# Patient Record
Sex: Female | Born: 1970
Health system: Southern US, Community
[De-identification: ages and names within clinical notes are randomized; demographics above are authoritative.]

## PROBLEM LIST (undated history)

## (undated) DIAGNOSIS — R112 Nausea with vomiting, unspecified: Secondary | ICD-10-CM

## (undated) DIAGNOSIS — F411 Generalized anxiety disorder: Secondary | ICD-10-CM

## (undated) DIAGNOSIS — D509 Iron deficiency anemia, unspecified: Secondary | ICD-10-CM

## (undated) DIAGNOSIS — R350 Frequency of micturition: Secondary | ICD-10-CM

## (undated) DIAGNOSIS — A599 Trichomoniasis, unspecified: Secondary | ICD-10-CM

## (undated) DIAGNOSIS — E785 Hyperlipidemia, unspecified: Secondary | ICD-10-CM

## (undated) DIAGNOSIS — R030 Elevated blood-pressure reading, without diagnosis of hypertension: Secondary | ICD-10-CM

## (undated) DIAGNOSIS — A749 Chlamydial infection, unspecified: Secondary | ICD-10-CM

## (undated) DIAGNOSIS — A6000 Herpesviral infection of urogenital system, unspecified: Secondary | ICD-10-CM

## (undated) DIAGNOSIS — I10 Essential (primary) hypertension: Secondary | ICD-10-CM

## (undated) DIAGNOSIS — R609 Edema, unspecified: Secondary | ICD-10-CM

## (undated) DIAGNOSIS — Z803 Family history of malignant neoplasm of breast: Secondary | ICD-10-CM

## (undated) DIAGNOSIS — B20 Human immunodeficiency virus [HIV] disease: Secondary | ICD-10-CM

## (undated) DIAGNOSIS — Z801 Family history of malignant neoplasm of trachea, bronchus and lung: Secondary | ICD-10-CM

## (undated) DIAGNOSIS — M1990 Unspecified osteoarthritis, unspecified site: Secondary | ICD-10-CM

## (undated) DIAGNOSIS — Z9889 Other specified postprocedural states: Secondary | ICD-10-CM

## (undated) DIAGNOSIS — R339 Retention of urine, unspecified: Secondary | ICD-10-CM

## (undated) DIAGNOSIS — B009 Herpesviral infection, unspecified: Secondary | ICD-10-CM

## (undated) DIAGNOSIS — K219 Gastro-esophageal reflux disease without esophagitis: Secondary | ICD-10-CM

## (undated) DIAGNOSIS — Z923 Personal history of irradiation: Secondary | ICD-10-CM

## (undated) DIAGNOSIS — E119 Type 2 diabetes mellitus without complications: Secondary | ICD-10-CM

## (undated) DIAGNOSIS — N921 Excessive and frequent menstruation with irregular cycle: Secondary | ICD-10-CM

## (undated) DIAGNOSIS — Z800 Family history of malignant neoplasm of digestive organs: Secondary | ICD-10-CM

## (undated) DIAGNOSIS — C801 Malignant (primary) neoplasm, unspecified: Secondary | ICD-10-CM

## (undated) DIAGNOSIS — Z87442 Personal history of urinary calculi: Secondary | ICD-10-CM

## (undated) DIAGNOSIS — G4730 Sleep apnea, unspecified: Secondary | ICD-10-CM

## (undated) DIAGNOSIS — E559 Vitamin D deficiency, unspecified: Secondary | ICD-10-CM

## (undated) DIAGNOSIS — L02229 Furuncle of trunk, unspecified: Secondary | ICD-10-CM

## (undated) HISTORY — PX: TUBAL LIGATION: SHX77

## (undated) HISTORY — DX: Hyperlipidemia, unspecified: E78.5

## (undated) HISTORY — DX: Unspecified osteoarthritis, unspecified site: M19.90

## (undated) HISTORY — DX: Furuncle of trunk, unspecified: L02.229

## (undated) HISTORY — DX: Generalized anxiety disorder: F41.1

## (undated) HISTORY — PX: ESOPHAGOGASTRODUODENOSCOPY: SHX1529

## (undated) HISTORY — DX: Herpesviral infection, unspecified: B00.9

## (undated) HISTORY — PX: MULTIPLE TOOTH EXTRACTIONS: SHX2053

## (undated) HISTORY — DX: Family history of malignant neoplasm of trachea, bronchus and lung: Z80.1

## (undated) HISTORY — DX: Frequency of micturition: R35.0

## (undated) HISTORY — DX: Elevated blood-pressure reading, without diagnosis of hypertension: R03.0

## (undated) HISTORY — DX: Family history of malignant neoplasm of digestive organs: Z80.0

## (undated) HISTORY — PX: WISDOM TOOTH EXTRACTION: SHX21

## (undated) HISTORY — DX: Excessive and frequent menstruation with irregular cycle: N92.1

## (undated) HISTORY — DX: Vitamin D deficiency, unspecified: E55.9

## (undated) HISTORY — DX: Human immunodeficiency virus (HIV) disease: B20

## (undated) HISTORY — DX: Retention of urine, unspecified: R33.9

## (undated) HISTORY — DX: Trichomoniasis, unspecified: A59.9

## (undated) HISTORY — PX: CHOLECYSTECTOMY: SHX55

## (undated) HISTORY — DX: Iron deficiency anemia, unspecified: D50.9

## (undated) HISTORY — DX: Edema, unspecified: R60.9

## (undated) HISTORY — DX: Family history of malignant neoplasm of breast: Z80.3

## (undated) HISTORY — DX: Chlamydial infection, unspecified: A74.9

## (undated) HISTORY — DX: Sleep apnea, unspecified: G47.30

## (undated) HISTORY — DX: Herpesviral infection of urogenital system, unspecified: A60.00

## (undated) HISTORY — DX: Type 2 diabetes mellitus without complications: E11.9

---

## 1997-11-23 ENCOUNTER — Inpatient Hospital Stay (HOSPITAL_COMMUNITY): Admission: EM | Admit: 1997-11-23 | Discharge: 1997-11-23 | Payer: Self-pay | Admitting: Obstetrics & Gynecology

## 1998-06-12 ENCOUNTER — Other Ambulatory Visit: Admission: RE | Admit: 1998-06-12 | Discharge: 1998-06-12 | Payer: Self-pay | Admitting: Obstetrics & Gynecology

## 1998-12-23 ENCOUNTER — Inpatient Hospital Stay (HOSPITAL_COMMUNITY): Admission: EM | Admit: 1998-12-23 | Discharge: 1998-12-23 | Payer: Self-pay | Admitting: *Deleted

## 1999-10-02 ENCOUNTER — Emergency Department (HOSPITAL_COMMUNITY): Admission: EM | Admit: 1999-10-02 | Discharge: 1999-10-02 | Payer: Self-pay | Admitting: Emergency Medicine

## 1999-11-16 ENCOUNTER — Ambulatory Visit (HOSPITAL_COMMUNITY): Admission: RE | Admit: 1999-11-16 | Discharge: 1999-11-16 | Payer: Self-pay | Admitting: Family Medicine

## 1999-11-16 ENCOUNTER — Encounter: Payer: Self-pay | Admitting: Family Medicine

## 2000-08-10 ENCOUNTER — Emergency Department (HOSPITAL_COMMUNITY): Admission: EM | Admit: 2000-08-10 | Discharge: 2000-08-10 | Payer: Self-pay | Admitting: Internal Medicine

## 2001-07-05 ENCOUNTER — Ambulatory Visit (HOSPITAL_BASED_OUTPATIENT_CLINIC_OR_DEPARTMENT_OTHER): Admission: RE | Admit: 2001-07-05 | Discharge: 2001-07-05 | Payer: Self-pay | Admitting: Orthopedic Surgery

## 2002-03-20 ENCOUNTER — Other Ambulatory Visit: Admission: RE | Admit: 2002-03-20 | Discharge: 2002-03-20 | Payer: Self-pay | Admitting: *Deleted

## 2002-03-30 ENCOUNTER — Emergency Department (HOSPITAL_COMMUNITY): Admission: EM | Admit: 2002-03-30 | Discharge: 2002-03-30 | Payer: Self-pay | Admitting: *Deleted

## 2002-12-13 ENCOUNTER — Emergency Department (HOSPITAL_COMMUNITY): Admission: EM | Admit: 2002-12-13 | Discharge: 2002-12-13 | Payer: Self-pay | Admitting: Emergency Medicine

## 2002-12-13 ENCOUNTER — Encounter: Payer: Self-pay | Admitting: Emergency Medicine

## 2002-12-31 ENCOUNTER — Emergency Department (HOSPITAL_COMMUNITY): Admission: EM | Admit: 2002-12-31 | Discharge: 2002-12-31 | Payer: Self-pay | Admitting: Emergency Medicine

## 2003-01-10 ENCOUNTER — Emergency Department (HOSPITAL_COMMUNITY): Admission: EM | Admit: 2003-01-10 | Discharge: 2003-01-10 | Payer: Self-pay

## 2003-01-11 ENCOUNTER — Emergency Department (HOSPITAL_COMMUNITY): Admission: EM | Admit: 2003-01-11 | Discharge: 2003-01-11 | Payer: Self-pay | Admitting: Emergency Medicine

## 2003-01-12 ENCOUNTER — Emergency Department (HOSPITAL_COMMUNITY): Admission: EM | Admit: 2003-01-12 | Discharge: 2003-01-12 | Payer: Self-pay | Admitting: *Deleted

## 2003-05-06 ENCOUNTER — Emergency Department (HOSPITAL_COMMUNITY): Admission: EM | Admit: 2003-05-06 | Discharge: 2003-05-06 | Payer: Self-pay | Admitting: Emergency Medicine

## 2003-07-15 ENCOUNTER — Emergency Department (HOSPITAL_COMMUNITY): Admission: EM | Admit: 2003-07-15 | Discharge: 2003-07-15 | Payer: Self-pay | Admitting: Emergency Medicine

## 2003-07-30 ENCOUNTER — Emergency Department (HOSPITAL_COMMUNITY): Admission: EM | Admit: 2003-07-30 | Discharge: 2003-07-30 | Payer: Self-pay | Admitting: Emergency Medicine

## 2005-01-11 ENCOUNTER — Ambulatory Visit: Payer: Self-pay | Admitting: Internal Medicine

## 2005-09-10 ENCOUNTER — Other Ambulatory Visit: Admission: RE | Admit: 2005-09-10 | Discharge: 2005-09-10 | Payer: Self-pay | Admitting: Gynecology

## 2005-10-08 ENCOUNTER — Ambulatory Visit (HOSPITAL_COMMUNITY): Admission: RE | Admit: 2005-10-08 | Discharge: 2005-10-08 | Payer: Self-pay | Admitting: Gynecology

## 2005-10-25 ENCOUNTER — Encounter: Admission: RE | Admit: 2005-10-25 | Discharge: 2005-10-25 | Payer: Self-pay | Admitting: Gynecology

## 2005-12-31 ENCOUNTER — Emergency Department (HOSPITAL_COMMUNITY): Admission: EM | Admit: 2005-12-31 | Discharge: 2005-12-31 | Payer: Self-pay | Admitting: Emergency Medicine

## 2006-04-18 ENCOUNTER — Ambulatory Visit: Payer: Self-pay | Admitting: Internal Medicine

## 2006-11-07 ENCOUNTER — Other Ambulatory Visit: Admission: RE | Admit: 2006-11-07 | Discharge: 2006-11-07 | Payer: Self-pay | Admitting: Gynecology

## 2007-01-23 ENCOUNTER — Ambulatory Visit: Payer: Self-pay | Admitting: Internal Medicine

## 2007-01-23 LAB — CONVERTED CEMR LAB
ALT: 23 units/L (ref 0–35)
AST: 27 units/L (ref 0–37)
Albumin: 3.6 g/dL (ref 3.5–5.2)
Alkaline Phosphatase: 118 units/L — ABNORMAL HIGH (ref 39–117)
BUN: 9 mg/dL (ref 6–23)
Basophils Absolute: 0 10*3/uL (ref 0.0–0.1)
Basophils Relative: 0.2 % (ref 0.0–1.0)
Bilirubin Urine: NEGATIVE
Bilirubin, Direct: 0.1 mg/dL (ref 0.0–0.3)
CO2: 30 meq/L (ref 19–32)
Calcium: 9.4 mg/dL (ref 8.4–10.5)
Chloride: 104 meq/L (ref 96–112)
Cholesterol: 211 mg/dL (ref 0–200)
Creatinine, Ser: 0.6 mg/dL (ref 0.4–1.2)
Direct LDL: 132.7 mg/dL
Eosinophils Absolute: 0 10*3/uL (ref 0.0–0.6)
Eosinophils Relative: 0.3 % (ref 0.0–5.0)
GFR calc Af Amer: 144 mL/min
GFR calc non Af Amer: 119 mL/min
Glucose, Bld: 72 mg/dL (ref 70–99)
HCT: 36.4 % (ref 36.0–46.0)
HDL: 45.3 mg/dL (ref >39.0)
Hemoglobin, Urine: NEGATIVE
Hemoglobin: 12 g/dL (ref 12.0–15.0)
Hgb A1c MFr Bld: 6.7 % — ABNORMAL HIGH (ref 4.6–6.0)
Ketones, ur: NEGATIVE mg/dL
Leukocytes, UA: NEGATIVE
Lymphocytes Relative: 41.5 % (ref 12.0–46.0)
MCHC: 33 g/dL (ref 30.0–36.0)
MCV: 76.9 fL — ABNORMAL LOW (ref 78.0–100.0)
Monocytes Absolute: 0.5 10*3/uL (ref 0.2–0.7)
Monocytes Relative: 9.8 % (ref 3.0–11.0)
Neutro Abs: 2.7 10*3/uL (ref 1.4–7.7)
Neutrophils Relative %: 48.2 % (ref 43.0–77.0)
Nitrite: NEGATIVE
Platelets: 338 10*3/uL (ref 150–400)
Potassium: 3.8 meq/L (ref 3.5–5.1)
RBC: 4.73 M/uL (ref 3.87–5.11)
RDW: 15.7 % — ABNORMAL HIGH (ref 11.5–14.6)
Sodium: 141 meq/L (ref 135–145)
Specific Gravity, Urine: >=1.03 (ref 1.000–1.03)
TSH: 2.99 microintl units/mL (ref 0.35–5.50)
Total Bilirubin: 0.5 mg/dL (ref 0.3–1.2)
Total CHOL/HDL Ratio: 4.7
Total Protein, Urine: NEGATIVE mg/dL
Total Protein: 7.6 g/dL (ref 6.0–8.3)
Triglycerides: 80 mg/dL (ref 0–149)
Urine Glucose: NEGATIVE mg/dL
Urobilinogen, UA: 0.2 (ref 0.0–1.0)
VLDL: 16 mg/dL (ref 0–40)
WBC: 5.5 10*3/uL (ref 4.5–10.5)
pH: 6 (ref 5.0–8.0)

## 2007-03-07 ENCOUNTER — Ambulatory Visit: Payer: Self-pay | Admitting: Internal Medicine

## 2007-03-07 LAB — CONVERTED CEMR LAB
BUN: 8 mg/dL (ref 6–23)
CO2: 28 meq/L (ref 19–32)
Calcium: 9.1 mg/dL (ref 8.4–10.5)
Chloride: 104 meq/L (ref 96–112)
Cholesterol: 198 mg/dL (ref 0–200)
Creatinine, Ser: 0.6 mg/dL (ref 0.4–1.2)
GFR calc Af Amer: 144 mL/min
GFR calc non Af Amer: 119 mL/min
Glucose, Bld: 106 mg/dL — ABNORMAL HIGH (ref 70–99)
HDL: 52.4 mg/dL (ref >39.0)
Hgb A1c MFr Bld: 6.3 % — ABNORMAL HIGH (ref 4.6–6.0)
LDL Cholesterol: 123 mg/dL — ABNORMAL HIGH (ref 0–99)
Potassium: 3.7 meq/L (ref 3.5–5.1)
Sodium: 139 meq/L (ref 135–145)
Total CHOL/HDL Ratio: 3.8
Triglycerides: 115 mg/dL (ref 0–149)
VLDL: 23 mg/dL (ref 0–40)

## 2007-03-17 ENCOUNTER — Encounter: Admission: RE | Admit: 2007-03-17 | Discharge: 2007-06-15 | Payer: Self-pay | Admitting: Internal Medicine

## 2007-07-27 DIAGNOSIS — Z21 Asymptomatic human immunodeficiency virus [HIV] infection status: Secondary | ICD-10-CM

## 2007-07-27 DIAGNOSIS — B20 Human immunodeficiency virus [HIV] disease: Secondary | ICD-10-CM

## 2007-07-27 HISTORY — DX: Asymptomatic human immunodeficiency virus (hiv) infection status: Z21

## 2007-07-27 HISTORY — DX: Human immunodeficiency virus (HIV) disease: B20

## 2007-08-02 ENCOUNTER — Ambulatory Visit: Payer: Self-pay | Admitting: Internal Medicine

## 2007-08-02 DIAGNOSIS — J209 Acute bronchitis, unspecified: Secondary | ICD-10-CM | POA: Insufficient documentation

## 2007-08-02 DIAGNOSIS — E08319 Diabetes mellitus due to underlying condition with unspecified diabetic retinopathy without macular edema: Secondary | ICD-10-CM | POA: Insufficient documentation

## 2007-08-02 DIAGNOSIS — E119 Type 2 diabetes mellitus without complications: Secondary | ICD-10-CM

## 2007-08-02 DIAGNOSIS — E785 Hyperlipidemia, unspecified: Secondary | ICD-10-CM

## 2007-08-02 DIAGNOSIS — R030 Elevated blood-pressure reading, without diagnosis of hypertension: Secondary | ICD-10-CM | POA: Insufficient documentation

## 2007-08-02 HISTORY — DX: Hyperlipidemia, unspecified: E78.5

## 2007-08-02 HISTORY — DX: Type 2 diabetes mellitus without complications: E11.9

## 2007-08-02 HISTORY — DX: Elevated blood-pressure reading, without diagnosis of hypertension: R03.0

## 2007-10-12 ENCOUNTER — Ambulatory Visit: Payer: Self-pay | Admitting: Internal Medicine

## 2007-10-26 ENCOUNTER — Encounter: Payer: Self-pay | Admitting: Internal Medicine

## 2007-11-17 ENCOUNTER — Other Ambulatory Visit: Admission: RE | Admit: 2007-11-17 | Discharge: 2007-11-17 | Payer: Self-pay | Admitting: Gynecology

## 2007-11-20 LAB — CONVERTED CEMR LAB: Pap Smear: NORMAL

## 2007-11-27 ENCOUNTER — Ambulatory Visit: Payer: Self-pay | Admitting: Internal Medicine

## 2007-11-27 DIAGNOSIS — F418 Other specified anxiety disorders: Secondary | ICD-10-CM | POA: Insufficient documentation

## 2007-11-27 DIAGNOSIS — F411 Generalized anxiety disorder: Secondary | ICD-10-CM | POA: Insufficient documentation

## 2007-11-27 HISTORY — DX: Generalized anxiety disorder: F41.1

## 2007-11-27 LAB — CONVERTED CEMR LAB
ALT: 24 units/L (ref 0–35)
AST: 26 units/L (ref 0–37)
Albumin: 3.6 g/dL (ref 3.5–5.2)
Alkaline Phosphatase: 113 units/L (ref 39–117)
BUN: 11 mg/dL (ref 6–23)
Basophils Absolute: 0 10*3/uL (ref 0.0–0.1)
Basophils Relative: 0.8 % (ref 0.0–1.0)
Bilirubin Urine: NEGATIVE
Bilirubin, Direct: 0.1 mg/dL (ref 0.0–0.3)
CO2: 29 meq/L (ref 19–32)
Calcium: 9.4 mg/dL (ref 8.4–10.5)
Chloride: 102 meq/L (ref 96–112)
Cholesterol: 192 mg/dL (ref 0–200)
Creatinine, Ser: 0.7 mg/dL (ref 0.4–1.2)
Creatinine,U: 207.1 mg/dL
Eosinophils Absolute: 0 10*3/uL (ref 0.0–0.7)
Eosinophils Relative: 0.5 % (ref 0.0–5.0)
GFR calc Af Amer: 120 mL/min
GFR calc non Af Amer: 100 mL/min
Glucose, Bld: 100 mg/dL — ABNORMAL HIGH (ref 70–99)
HCT: 36.1 % (ref 36.0–46.0)
HDL: 41.9 mg/dL (ref >39.0)
Hemoglobin, Urine: NEGATIVE
Hemoglobin: 11.8 g/dL — ABNORMAL LOW (ref 12.0–15.0)
Hgb A1c MFr Bld: 6.5 % — ABNORMAL HIGH (ref 4.6–6.0)
LDL Cholesterol: 132 mg/dL — ABNORMAL HIGH (ref 0–99)
Leukocytes, UA: NEGATIVE
Lymphocytes Relative: 39.7 % (ref 12.0–46.0)
MCHC: 32.6 g/dL (ref 30.0–36.0)
MCV: 78 fL (ref 78.0–100.0)
Microalb Creat Ratio: 4.3 mg/g (ref 0.0–30.0)
Microalb, Ur: 0.9 mg/dL (ref 0.0–1.9)
Monocytes Absolute: 0.4 10*3/uL (ref 0.1–1.0)
Monocytes Relative: 8.4 % (ref 3.0–12.0)
Neutro Abs: 2.4 10*3/uL (ref 1.4–7.7)
Neutrophils Relative %: 50.6 % (ref 43.0–77.0)
Nitrite: NEGATIVE
Platelets: 354 10*3/uL (ref 150–400)
Potassium: 4.1 meq/L (ref 3.5–5.1)
RBC: 4.63 M/uL (ref 3.87–5.11)
RDW: 16.2 % — ABNORMAL HIGH (ref 11.5–14.6)
Sodium: 138 meq/L (ref 135–145)
Specific Gravity, Urine: 1.025 (ref 1.000–1.03)
TSH: 2.3 microintl units/mL (ref 0.35–5.50)
Total Bilirubin: 0.6 mg/dL (ref 0.3–1.2)
Total CHOL/HDL Ratio: 4.6
Total Protein, Urine: NEGATIVE mg/dL
Total Protein: 7.5 g/dL (ref 6.0–8.3)
Triglycerides: 91 mg/dL (ref 0–149)
Urine Glucose: NEGATIVE mg/dL
Urobilinogen, UA: 0.2 (ref 0.0–1.0)
VLDL: 18 mg/dL (ref 0–40)
WBC: 4.7 10*3/uL (ref 4.5–10.5)
pH: 6 (ref 5.0–8.0)

## 2008-01-11 HISTORY — PX: ENDOMETRIAL ABLATION: SHX621

## 2008-03-21 ENCOUNTER — Ambulatory Visit: Payer: Self-pay | Admitting: Internal Medicine

## 2008-03-21 DIAGNOSIS — N921 Excessive and frequent menstruation with irregular cycle: Secondary | ICD-10-CM | POA: Insufficient documentation

## 2008-03-21 DIAGNOSIS — A749 Chlamydial infection, unspecified: Secondary | ICD-10-CM

## 2008-03-21 DIAGNOSIS — J069 Acute upper respiratory infection, unspecified: Secondary | ICD-10-CM | POA: Insufficient documentation

## 2008-03-21 HISTORY — DX: Chlamydial infection, unspecified: A74.9

## 2008-03-21 HISTORY — DX: Excessive and frequent menstruation with irregular cycle: N92.1

## 2008-03-22 ENCOUNTER — Encounter: Payer: Self-pay | Admitting: Internal Medicine

## 2008-04-05 ENCOUNTER — Ambulatory Visit: Payer: Self-pay | Admitting: Gynecology

## 2008-05-23 ENCOUNTER — Encounter: Payer: Self-pay | Admitting: Internal Medicine

## 2008-09-05 ENCOUNTER — Encounter: Payer: Self-pay | Admitting: Internal Medicine

## 2008-11-14 ENCOUNTER — Encounter: Payer: Self-pay | Admitting: Internal Medicine

## 2009-01-16 ENCOUNTER — Encounter: Payer: Self-pay | Admitting: Internal Medicine

## 2009-01-17 ENCOUNTER — Other Ambulatory Visit: Admission: RE | Admit: 2009-01-17 | Discharge: 2009-01-17 | Payer: Self-pay | Admitting: Gynecology

## 2009-01-17 ENCOUNTER — Ambulatory Visit: Payer: Self-pay | Admitting: Gynecology

## 2009-01-17 ENCOUNTER — Encounter: Payer: Self-pay | Admitting: Internal Medicine

## 2009-01-17 ENCOUNTER — Encounter: Payer: Self-pay | Admitting: Gynecology

## 2009-01-22 ENCOUNTER — Encounter: Admission: RE | Admit: 2009-01-22 | Discharge: 2009-01-22 | Payer: Self-pay | Admitting: Gynecology

## 2009-02-14 ENCOUNTER — Encounter: Payer: Self-pay | Admitting: Internal Medicine

## 2009-03-12 ENCOUNTER — Ambulatory Visit: Payer: Self-pay | Admitting: Internal Medicine

## 2009-03-12 DIAGNOSIS — A6000 Herpesviral infection of urogenital system, unspecified: Secondary | ICD-10-CM | POA: Insufficient documentation

## 2009-03-12 DIAGNOSIS — B20 Human immunodeficiency virus [HIV] disease: Secondary | ICD-10-CM | POA: Insufficient documentation

## 2009-03-12 HISTORY — DX: Human immunodeficiency virus (HIV) disease: B20

## 2009-03-12 HISTORY — DX: Herpesviral infection of urogenital system, unspecified: A60.00

## 2009-03-12 LAB — CONVERTED CEMR LAB: Hgb A1c MFr Bld: 6.4 % (ref 4.6–6.5)

## 2009-03-21 ENCOUNTER — Encounter: Payer: Self-pay | Admitting: Internal Medicine

## 2009-05-19 ENCOUNTER — Ambulatory Visit: Payer: Self-pay | Admitting: Internal Medicine

## 2009-05-19 DIAGNOSIS — R350 Frequency of micturition: Secondary | ICD-10-CM

## 2009-05-19 DIAGNOSIS — R339 Retention of urine, unspecified: Secondary | ICD-10-CM | POA: Insufficient documentation

## 2009-05-19 HISTORY — DX: Frequency of micturition: R35.0

## 2009-05-19 HISTORY — DX: Retention of urine, unspecified: R33.9

## 2009-05-19 LAB — CONVERTED CEMR LAB
Bilirubin Urine: NEGATIVE
Ketones, ur: NEGATIVE mg/dL
Leukocytes, UA: NEGATIVE
Nitrite: NEGATIVE
Specific Gravity, Urine: 1.025 (ref 1.000–1.030)
Total Protein, Urine: NEGATIVE mg/dL
Urine Glucose: NEGATIVE mg/dL
Urobilinogen, UA: 0.2 (ref 0.0–1.0)
pH: 6 (ref 5.0–8.0)

## 2009-09-10 ENCOUNTER — Ambulatory Visit: Payer: Self-pay | Admitting: Women's Health

## 2009-10-08 ENCOUNTER — Ambulatory Visit: Payer: Self-pay | Admitting: Internal Medicine

## 2009-10-21 ENCOUNTER — Telehealth: Payer: Self-pay | Admitting: Internal Medicine

## 2010-01-19 ENCOUNTER — Other Ambulatory Visit: Admission: RE | Admit: 2010-01-19 | Discharge: 2010-01-19 | Payer: Self-pay | Admitting: Gynecology

## 2010-01-19 ENCOUNTER — Ambulatory Visit: Payer: Self-pay | Admitting: Gynecology

## 2010-01-19 ENCOUNTER — Encounter: Payer: Self-pay | Admitting: Internal Medicine

## 2010-01-19 ENCOUNTER — Encounter: Admission: RE | Admit: 2010-01-19 | Discharge: 2010-01-19 | Payer: Self-pay | Admitting: Gynecology

## 2010-01-19 DIAGNOSIS — A599 Trichomoniasis, unspecified: Secondary | ICD-10-CM

## 2010-01-19 HISTORY — DX: Trichomoniasis, unspecified: A59.9

## 2010-01-20 LAB — HM MAMMOGRAPHY: HM Mammogram: NORMAL

## 2010-01-20 LAB — CONVERTED CEMR LAB: Pap Smear: NORMAL

## 2010-01-28 ENCOUNTER — Ambulatory Visit: Payer: Self-pay | Admitting: Internal Medicine

## 2010-01-30 ENCOUNTER — Ambulatory Visit: Payer: Self-pay | Admitting: Internal Medicine

## 2010-01-30 DIAGNOSIS — R609 Edema, unspecified: Secondary | ICD-10-CM

## 2010-01-30 DIAGNOSIS — E559 Vitamin D deficiency, unspecified: Secondary | ICD-10-CM | POA: Insufficient documentation

## 2010-01-30 DIAGNOSIS — D509 Iron deficiency anemia, unspecified: Secondary | ICD-10-CM | POA: Insufficient documentation

## 2010-01-30 HISTORY — DX: Iron deficiency anemia, unspecified: D50.9

## 2010-01-30 HISTORY — DX: Vitamin D deficiency, unspecified: E55.9

## 2010-01-30 HISTORY — DX: Edema, unspecified: R60.9

## 2010-02-02 LAB — CONVERTED CEMR LAB
ALT: 17 units/L (ref 0–35)
AST: 19 units/L (ref 0–37)
Albumin: 3.7 g/dL (ref 3.5–5.2)
Alkaline Phosphatase: 115 units/L (ref 39–117)
BUN: 9 mg/dL (ref 6–23)
Bilirubin Urine: NEGATIVE
Bilirubin, Direct: 0.2 mg/dL (ref 0.0–0.3)
CO2: 27 meq/L (ref 19–32)
Calcium: 9.1 mg/dL (ref 8.4–10.5)
Chloride: 108 meq/L (ref 96–112)
Cholesterol: 176 mg/dL (ref 0–200)
Creatinine, Ser: 0.6 mg/dL (ref 0.4–1.2)
Creatinine,U: 215.4 mg/dL
Folate: 13.9 ng/mL
GFR calc non Af Amer: 153.35 mL/min (ref >60)
Glucose, Bld: 95 mg/dL (ref 70–99)
HDL: 40.3 mg/dL (ref >39.00)
Hemoglobin, Urine: NEGATIVE
Hgb A1c MFr Bld: 6.5 % (ref 4.6–6.5)
Iron: 35 ug/dL — ABNORMAL LOW (ref 42–145)
Ketones, ur: NEGATIVE mg/dL
LDL Cholesterol: 119 mg/dL — ABNORMAL HIGH (ref 0–99)
Leukocytes, UA: NEGATIVE
Microalb Creat Ratio: 0.4 mg/g (ref 0.0–30.0)
Microalb, Ur: 0.9 mg/dL (ref 0.0–1.9)
Nitrite: NEGATIVE
Potassium: 4.3 meq/L (ref 3.5–5.1)
Saturation Ratios: 8.8 % — ABNORMAL LOW (ref 20.0–50.0)
Sodium: 140 meq/L (ref 135–145)
Specific Gravity, Urine: >=1.03 (ref 1.000–1.030)
TSH: 2.28 microintl units/mL (ref 0.35–5.50)
Total Bilirubin: 1.5 mg/dL — ABNORMAL HIGH (ref 0.3–1.2)
Total CHOL/HDL Ratio: 4
Total Protein, Urine: NEGATIVE mg/dL
Total Protein: 7.2 g/dL (ref 6.0–8.3)
Transferrin: 284.4 mg/dL (ref 212.0–360.0)
Triglycerides: 82 mg/dL (ref 0.0–149.0)
Urine Glucose: NEGATIVE mg/dL
Urobilinogen, UA: 0.2 (ref 0.0–1.0)
VLDL: 16.4 mg/dL (ref 0.0–40.0)
Vitamin B-12: 782 pg/mL (ref 211–911)
pH: 5.5 (ref 5.0–8.0)

## 2010-04-16 ENCOUNTER — Ambulatory Visit: Payer: Self-pay | Admitting: Internal Medicine

## 2010-04-16 DIAGNOSIS — L02229 Furuncle of trunk, unspecified: Secondary | ICD-10-CM | POA: Insufficient documentation

## 2010-04-16 DIAGNOSIS — L02239 Carbuncle of trunk, unspecified: Secondary | ICD-10-CM | POA: Insufficient documentation

## 2010-04-16 HISTORY — DX: Furuncle of trunk, unspecified: L02.229

## 2010-04-16 HISTORY — DX: Carbuncle of trunk, unspecified: L02.239

## 2010-05-15 ENCOUNTER — Telehealth: Payer: Self-pay | Admitting: Internal Medicine

## 2010-06-29 ENCOUNTER — Encounter: Payer: Self-pay | Admitting: Internal Medicine

## 2010-07-03 ENCOUNTER — Encounter: Payer: Self-pay | Admitting: Internal Medicine

## 2010-07-22 HISTORY — PX: ABDOMINAL HYSTERECTOMY: SHX81

## 2010-07-24 ENCOUNTER — Encounter: Payer: Self-pay | Admitting: Internal Medicine

## 2010-08-16 ENCOUNTER — Encounter: Payer: Self-pay | Admitting: Gynecology

## 2010-08-27 NOTE — Letter (Signed)
Summary: [facility]   Imported By: Lester Pe Ell 07/10/2010 10:37:52  _____________________________________________________________________  External Attachment:    Type:   Image     Comment:   External Document

## 2010-08-27 NOTE — Assessment & Plan Note (Signed)
Summary: SORE THROAT/FEVER/BRONCHITIS? /NWS $50   Vital Signs:  Patient Profile:   40 Years Old Female Weight:      243.38 pounds Temp:     98.3 degrees F oral Pulse rate:   103 / minute BP sitting:   122 / 88  (left arm) Cuff size:   regular  Vitals Entered By: Rock Nephew CMA (March 21, 2008 9:35 AM)                 Chief Complaint:  sore throat/bronchitis.  History of Present Illness: 40 y/o reports for acute onset of sore throat 4 days ago that has not been associated with fever, chills, painful swallowing, or LAD. She reports a mild cough productive of scant amt of yellow phlegm and denies experiencing any SOB, night sweats, chest pain, hemoptysis, wheezing, or DOE. She has gotten symptom relief with a left over codeine-based cough suppressant which has run out. She has been using an old "asthma Pump" that has not helped with her symptoms. Her last BS check was 5 days aog and it was 102.    Prior Medication List:  ALPRAZOLAM 0.25 MG TABS (ALPRAZOLAM) Take 1 tablet by mouth three times a day METFORMIN HCL 500 MG  TABS (METFORMIN HCL) 1po qd ADULT ASPIRIN EC LOW STRENGTH 81 MG  TBEC (ASPIRIN) 1 by mouth qd   Current Allergies: ! PENICILLIN   Family History:    Reviewed history from 08/02/2007 and no changes required:       prostate cancer       heart disease       stroke       HTN       DM  Social History:    Reviewed history from 11/27/2007 and no changes required:       Never Smoked       Alcohol use-no       Divorced       5 children       work - Lawyer and bus driver          Risk Factors:  Passive smoke exposure:  no Drug use:  no HIV high-risk behavior:  no   Review of Systems  The patient denies anorexia, fever, hoarseness, chest pain, syncope, dyspnea on exertion, peripheral edema, and muscle weakness.     Physical Exam  General:     alert, well-developed, well-nourished, well-hydrated, and overweight-appearing.  She has no resp  distress, trismus, or stridor. Head:     Normocephalic and atraumatic without obvious abnormalities. No apparent alopecia or balding. Eyes:     No corneal or conjunctival inflammation noted. EOMI. Perrla. Funduscopic exam benign, without hemorrhages, exudates or papilledema. Vision grossly normal. Ears:     External ear exam shows no significant lesions or deformities.  Otoscopic examination reveals clear canals, tympanic membranes are intact bilaterally without bulging, retraction, inflammation or discharge. Hearing is grossly normal bilaterally. Nose:     External nasal examination shows no deformity or inflammation. Nasal mucosa are pink and moist without lesions or exudates. Mouth:     Oral mucosa and oropharynx without lesions or exudates.  Teeth in good repair. Neck:     No deformities, masses, or tenderness noted. Lungs:     Normal respiratory effort, chest expands symmetrically. Lungs are clear to auscultation, no crackles or wheezes. Heart:     Normal rate and regular rhythm. S1 and S2 normal without gallop, murmur, click, rub or other extra sounds. Abdomen:  Bowel sounds positive,abdomen soft and non-tender without masses, organomegaly or hernias noted. Mildly obese. Pulses:     R and L carotid,radial,femoral,dorsalis pedis and posterior tibial pulses are full and equal bilaterally Extremities:     no edema    Impression & Recommendations:  Problem # 1:  COUGH (ICD-786.2) I will order CXR to look for any pulmonary pathology. Symptoms, signs, and PE are consistent with Viral URI without any evidence of asthma or secondary bacterial component. She can d'c the inhaler and continue a narcotic cough suppressant with strong warning against use while driving or doing any activities that require alertness. She'll report any F/C/SOB/NS/wheezing. Orders: T-2 View CXR, Same Day (71020.5TC)   Problem # 2:  URI (ICD-465.9)  Her updated medication list for this problem includes:     Adult Aspirin Ec Low Strength 81 Mg Tbec (Aspirin) .Marland Kitchen... 1 by mouth qd    Tussionex Pennkinetic Er 8-10 Mg/22ml Lqcr (Chlorpheniramine-hydrocodone) .Marland Kitchen... 0ne tsp two times a day as needed cough   Problem # 3:  METRORRHAGIA (ICD-626.6)  Problem # 4:  DIABETES MELLITUS, TYPE II (ICD-250.00)  Her updated medication list for this problem includes:    Metformin Hcl 500 Mg Tabs (Metformin hcl) .Marland Kitchen... 1po qd    Adult Aspirin Ec Low Strength 81 Mg Tbec (Aspirin) .Marland Kitchen... 1 by mouth qd   Complete Medication List: 1)  Alprazolam 0.25 Mg Tabs (Alprazolam) .... Take 1 tablet by mouth three times a day 2)  Metformin Hcl 500 Mg Tabs (Metformin hcl) .Marland Kitchen.. 1po qd 3)  Adult Aspirin Ec Low Strength 81 Mg Tbec (Aspirin) .Marland Kitchen.. 1 by mouth qd 4)  Tussionex Pennkinetic Er 8-10 Mg/56ml Lqcr (Chlorpheniramine-hydrocodone) .... 0ne tsp two times a day as needed cough   Patient Instructions: 1)  Please schedule a follow-up appointment in 2 weeks. 2)  Get plenty of rest, drink lots of clear liquids, and use Tylenol or Ibuprofen for fever and comfort. Return in 7-10 days if you're not better:sooner if you're feeling worse. 3)  Take antibiotics before any dental, gastrointestinal, or genitourinary procedures to prevent damage to your heart valves from an infection.    Prescriptions: Kelly Rowe ER 8-10 MG/5ML LQCR (CHLORPHENIRAMINE-HYDROCODONE) 0ne tsp two times a day as needed cough  #3 ounces x 0   Entered and Authorized by:   Etta Grandchild MD   Signed by:   Etta Grandchild MD on 03/21/2008   Method used:   Print then Give to Patient   RxID:   [id number]  ]  Appended Document: SORE THROAT/FEVER/BRONCHITIS? Kelly Rowe $50 As billing provider, I have reviewed this document.

## 2010-08-27 NOTE — Progress Notes (Signed)
Summary: rx request  Phone Note Call from Patient Call back at Home Phone [phone]   Summary of Call: Patient left message on triage that she contacted her pharmacy for refill of inhaler. Pharmacy advised her that they need a new prescription. Please advise. Initial call taken by: Lucious Groves,  October 21, 2009 3:08 PM  Follow-up for Phone Call        sorry, there is no inhaler listed on her chronic med list;  can she be more specific which one?  and why she needed it in the past? Follow-up by: Corwin Levins MD,  October 21, 2009 5:10 PM  Additional Follow-up for Phone Call Additional follow up Details #1::        pt has been on Proventil HFA that was removed 12-02-07. pt states that she has had congestion and cough. Additional Follow-up by: Nikyah Pyle, CMA,  October 22, 2009 8:39 AM    Additional Follow-up for Phone Call Additional follow up Details #2::    ok for refill per robin;  pt should make OV if wheezing continues , fever or sob Follow-up by: Corwin Levins MD,  October 22, 2009 1:23 PM  New/Updated Medications: PROVENTIL HFA 108 (90 BASE) MCG/ACT AERS (ALBUTEROL SULFATE) Inhale 2 puffs as directed four times every day as needed Prescriptions: PROVENTIL HFA 108 (90 BASE) MCG/ACT AERS (ALBUTEROL SULFATE) Inhale 2 puffs as directed four times every day as needed  #1 x 2   Entered by:   Scharlene Gloss   Authorized by:   Corwin Levins MD   Signed by:   Scharlene Gloss on 10/22/2009   Method used:   F[facility]ed to ...       [facility]       [address]       [place], [place]  [address]       Ph: [phone]       Fax: [phone]   RxID:   [id number]

## 2010-08-27 NOTE — Letter (Signed)
Summary: Out of Work  [facility]  [address] [place], [place] [address]   Phone: [phone]  Fax: [phone]    October 12, 2007   Employee:  Kailiana Aden    To Whom It May Concern:   For Medical reasons, please excuse the above named employee from work for the following dates:  Start:   Oct 12, 2007  End:   Oct 15, 2007 ---- to return to work Oct 16, 2007   If you need additional information, please feel free to contact our office.         Sincerely,    Corwin Levins MD

## 2010-08-27 NOTE — Assessment & Plan Note (Signed)
Summary: FU-$50-STC   Vital Signs:  Patient Profile:   40 Years Old Female Weight:      260 pounds Temp:     99.0 degrees F Pulse rate:   76 / minute BP sitting:   122 / 80  (right arm) Cuff size:   regular  Pt. in pain?   no  Vitals Entered By: Maris Berger (Nov 27, 2007 9:38 AM)                  Preventive Care Screening  Pap Smear:    Date:  11/20/2007    Results:  normal    Chief Complaint:  F/U.  History of Present Illness: out for metformin for one week due to just not geting to the pharmacy for the refill, stresed due to working 7 days per week; works as Lawyer an Midwife, no polys or low sugars    Updated Prior Medication List: ALPRAZOLAM 0.25 MG TABS (ALPRAZOLAM) Take 1 tablet by mouth three times a day METFORMIN HCL 500 MG  TABS (METFORMIN HCL) 1po qd ADULT ASPIRIN EC LOW STRENGTH 81 MG  TBEC (ASPIRIN) 1 by mouth qd  Current Allergies (reviewed today): ! PENICILLIN  Past Medical History:    Reviewed history from 08/02/2007 and no changes required:       Diabetes mellitus, type II       Hyperlipidemia       Anxiety  Past Surgical History:    Reviewed history from 08/02/2007 and no changes required:       Cholecystectomy       Tubal ligation   Family History:    Reviewed history from 08/02/2007 and no changes required:       prostate cancer       heart disease       stroke       HTN       DM  Social History:    Reviewed history from 08/02/2007 and no changes required:       Never Smoked       Alcohol use-no       Divorced       5 children       work - Lawyer and bus driver          Risk Factors:  PAP Smear History:     Date of Last PAP Smear:  11/20/2007    Results:  normal    Review of Systems  The patient denies anorexia, fever, weight loss, weight gain, vision loss, decreased hearing, hoarseness, chest pain, syncope, dyspnea on exhertion, peripheral edema, prolonged cough, hemoptysis, abdominal pain, melena,  hematochezia, severe indigestion/heartburn, hematuria, incontinence, genital sores, muscle weakness, suspicious skin lesions, transient blindness, difficulty walking, depression, unusual weight change, abnormal bleeding, enlarged lymph nodes, angioedema, and breast masses.         all otherwise negative    Physical Exam  General:     Well-developed,well-nourished,in no acute distress; alert,appropriate and cooperative throughout examination Head:     Normocephalic and atraumatic without obvious abnormalities. No apparent alopecia or balding. Eyes:     No corneal or conjunctival inflammation noted. EOMI. Perrla Ears:     External ear exam shows no significant lesions or deformities.  Otoscopic examination reveals clear canals, tympanic membranes are intact bilaterally without bulging, retraction, inflammation or discharge. Hearing is grossly normal bilaterally. Nose:     External nasal examination shows no deformity or inflammation. Nasal mucosa are pink and moist without lesions  or exudates. Mouth:     Oral mucosa and oropharynx without lesions or exudates.  Teeth in good repair. Neck:     No deformities, masses, or tenderness noted. Lungs:     Normal respiratory effort, chest expands symmetrically. Lungs are clear to auscultation, no crackles or wheezes. Heart:     Normal rate and regular rhythm. S1 and S2 normal without gallop, murmur, click, rub or other extra sounds. Abdomen:     Bowel sounds positive,abdomen soft and non-tender without masses, organomegaly or hernias noted. Msk:     No deformity or scoliosis noted of thoracic or lumbar spine.   Extremities:     No clubbing, cyanosis, edema, or deformity noted with normal full range of motion of all joints.   Neurologic:     No cranial nerve deficits noted. Station and gait are normal. Plantar reflexes are down-going bilaterally. DTRs are symmetrical throughout. Sensory, motor and coordinative functions appear intact. Psych:      moderately anxious.      Impression & Recommendations:  Problem # 1:  Preventive Health Care (ICD-V70.0) Overall doing well, up to date, counseled on routine health concerns for screening and prevention, immunizations up to date or declined, labs ordered   Orders: TLB-BMP (Basic Metabolic Panel-BMET) (80048-METABOL) TLB-CBC Platelet - w/Differential (85025-CBCD) TLB-Hepatic/Liver Function Pnl (80076-HEPATIC) TLB-Lipid Panel (80061-LIPID) TLB-TSH (Thyroid Stimulating Hormone) (84443-TSH) TLB-Udip ONLY (81003-UDIP)   Problem # 2:  DIABETES MELLITUS, TYPE II (ICD-250.00)  Her updated medication list for this problem includes:    Metformin Hcl 500 Mg Tabs (Metformin hcl) .Marland Kitchen... 1po qd    Adult Aspirin Ec Low Strength 81 Mg Tbec (Aspirin) .Marland Kitchen... 1 by mouth qd  Orders: TLB-A1C / Hgb A1C (Glycohemoglobin) (83036-A1C) TLB-Microalbumin/Creat Ratio, Urine (82043-MALB)  stable overall by hx and exam, ok to continue meds/tx as is, check labs  Problem # 3:  ANXIETY (ICD-300.00)  Her updated medication list for this problem includes:    Alprazolam 0.25 Mg Tabs (Alprazolam) .Marland Kitchen... Take 1 tablet by mouth three times a day meds refilled  Complete Medication List: 1)  Alprazolam 0.25 Mg Tabs (Alprazolam) .... Take 1 tablet by mouth three times a day 2)  Metformin Hcl 500 Mg Tabs (Metformin hcl) .Marland Kitchen.. 1po qd 3)  Adult Aspirin Ec Low Strength 81 Mg Tbec (Aspirin) .Marland Kitchen.. 1 by mouth qd   Patient Instructions: 1)  Take an Aspirin every day - 81 mg - 1 per day - COATED only  2)  continue all medications that you may have been taking previously 3)  you will have blood work today 4)  Please schedule a follow-up appointment in 6 months with: 5)  BMP prior to visit, ICD-9: 272.0 6)  Lipid Panel prior to visit, ICD-9: 7)  HbgA1C prior to visit, ICD-9: 8)  you should call [facility] ([place]) to ask about the surgury for weight loss    Prescriptions: ALPRAZOLAM 0.25 MG TABS (ALPRAZOLAM)  Take 1 tablet by mouth three times a day  #90 x 3   Entered and Authorized by:   Corwin Levins MD   Signed by:   Corwin Levins MD on 11/27/2007   Method used:   Print then Give to Patient   RxID:   [id number] METFORMIN HCL 500 MG  TABS (METFORMIN HCL) 1po qd  #90 x 3   Entered and Authorized by:   Corwin Levins MD   Signed by:   Corwin Levins MD on 11/27/2007   Method used:  Electronically sent to ...       [facility]       [address]       [place], [place]  [address]       Ph: [phone]       Fax: [phone]   RxID:   [id number]  ]

## 2010-08-27 NOTE — Assessment & Plan Note (Signed)
Summary: SORE THROAT /NWS  Medications Added ALPRAZOLAM 0.25 MG TABS (ALPRAZOLAM) Take 1 tablet by mouth three times a day PROVENTIL HFA 108 (90 BASE) MCG/ACT AERS (ALBUTEROL SULFATE) Inhale 2 puff as directed four times a day AZITHROMYCIN 250 MG  TABS (AZITHROMYCIN) 2po qd for 1 day, then 1po qd for 4days, then stop PREDNISONE 10 MG  TABS (PREDNISONE) 1 by mouth qd PROMETHAZINE-CODEINE 6.25-10 MG/5ML  SYRP (PROMETHAZINE-CODEINE) 1 tsp by mouth qid prn      Allergies Added: ! PENICILLIN  Vital Signs:  Patient Profile:   40 Years Old Female Weight:      265 pounds Temp:     97.8 degrees F Pulse rate:   78 / minute BP sitting:   158 / 94  (right arm) Cuff size:   small  Pt. in pain?   no  Vitals Entered By: Maris Berger (August 02, 2007 11:51 AM)                  Chief Complaint:  sore throat and congestion.  History of Present Illness: here with severe st for several days, now with prod cough and mild sob and wheee today  Current Allergies (reviewed today): ! PENICILLIN Updated/Current Medications (including changes made in today's visit):  ALPRAZOLAM 0.25 MG TABS (ALPRAZOLAM) Take 1 tablet by mouth three times a day PROVENTIL HFA 108 (90 BASE) MCG/ACT AERS (ALBUTEROL SULFATE) Inhale 2 puff as directed four times a day AZITHROMYCIN 250 MG  TABS (AZITHROMYCIN) 2po qd for 1 day, then 1po qd for 4days, then stop PREDNISONE 10 MG  TABS (PREDNISONE) 1 by mouth qd PROMETHAZINE-CODEINE 6.25-10 MG/5ML  SYRP (PROMETHAZINE-CODEINE) 1 tsp by mouth qid prn   Past Medical History:    Reviewed history and no changes required:       Diabetes mellitus, type II       Hyperlipidemia  Past Surgical History:    Reviewed history and no changes required:       Cholecystectomy       Tubal ligation   Family History:    Reviewed history and no changes required:       prostate cancer       heart disease       stroke       HTN       DM  Social History:    Reviewed  history and no changes required:       Never Smoked       Alcohol use-no   Risk Factors:  Tobacco use:  never Alcohol use:  no    Physical Exam  General:     mild ill Head:     Normocephalic and atraumatic without obvious abnormalities. No apparent alopecia or balding. Eyes:     No corneal or conjunctival inflammation noted. EOMI. Perrla. Ears:     bilat tm's red Nose:     External nasal examination shows no deformity or inflammation. Nasal mucosa are pink and moist without lesions or exudates. Mouth:     Oral mucosa and oropharynx without lesions or exudates.  Teeth in good repair. Neck:     No deformities, masses, or tenderness noted. Lungs:     R decreased breath sounds, R wheezes, L decreased breath sounds, and L wheezes.   Heart:     Normal rate and regular rhythm. S1 and S2 normal without gallop, murmur, click, rub or other extra sounds. Extremities:     no edema    Impression &  Recommendations:  Problem # 1:  ASTHMATIC BRONCHITIS, ACUTE (ICD-466.0)  Her updated medication list for this problem includes:    Proventil Hfa 108 (90 Base) Mcg/act Aers (Albuterol sulfate) ..... Inhale 2 puff as directed four times a day    Azithromycin 250 Mg Tabs (Azithromycin) .Marland Kitchen... 2po qd for 1 day, then 1po qd for 4days, then stop    Promethazine-codeine 6.25-10 Mg/28ml Syrp (Promethazine-codeine) .Marland Kitchen... 1 tsp by mouth qid prn  tx as above  Problem # 2:  DIABETES MELLITUS, TYPE II (ICD-250.00) check cbg's - call for > 200  Problem # 3:  ELEVATED BLOOD PRESSURE WITHOUT DIAGNOSIS OF HYPERTENSION (ICD-796.2) likely situational, though should continue to monitor closely, cont efforts at wt loss  Complete Medication List: 1)  Alprazolam 0.25 Mg Tabs (Alprazolam) .... Take 1 tablet by mouth three times a day 2)  Proventil Hfa 108 (90 Base) Mcg/act Aers (Albuterol sulfate) .... Inhale 2 puff as directed four times a day 3)  Azithromycin 250 Mg Tabs (Azithromycin) .... 2po qd for 1  day, then 1po qd for 4days, then stop 4)  Prednisone 10 Mg Tabs (Prednisone) .Marland Kitchen.. 1 by mouth qd 5)  Promethazine-codeine 6.25-10 Mg/74ml Syrp (Promethazine-codeine) .Marland Kitchen.. 1 tsp by mouth qid prn   Patient Instructions: 1)  Take all medications as prescribed 2)  Take 650-1000mg  of Tylenol every 4-6 hours as needed for relief of pain or comfort of fever AVOID taking more than 4000mg   in a 24 hour period (can cause liver damage in higher doses). 3)  you can use Mucinex for congestion 4)  Please schedule a follow-up appointment as needed. 5)  Check your Blood Pressure regularly. If it is above 140/90: you should make an appointment.    Prescriptions: PROMETHAZINE-CODEINE 6.25-10 MG/5ML  SYRP (PROMETHAZINE-CODEINE) 1 tsp by mouth qid prn  #8 oz x 1   Entered and Authorized by:   Corwin Levins MD   Signed by:   Corwin Levins MD on 08/02/2007   Method used:   Print then Give to Patient   RxID:   [id number] PREDNISONE 10 MG  TABS (PREDNISONE) 1 by mouth qd  #5 x 0   Entered and Authorized by:   Corwin Levins MD   Signed by:   Corwin Levins MD on 08/02/2007   Method used:   Print then Give to Patient   RxID:   [id number] AZITHROMYCIN 250 MG  TABS (AZITHROMYCIN) 2po qd for 1 day, then 1po qd for 4days, then stop  #6 x 1   Entered and Authorized by:   Corwin Levins MD   Signed by:   Corwin Levins MD on 08/02/2007   Method used:   Print then Give to Patient   RxID:   [id number]  ]

## 2010-08-27 NOTE — Progress Notes (Signed)
Summary: Rf cough med  Phone Note Call from Patient Call back at Home Phone [phone]   Caller: Patient Summary of Call: pt left vm requesting Rf on Hydroco-Homatrophine cough syrup. please advise Initial call taken by: Lanier Prude, [facility]),  May 15, 2010 2:45 PM  Follow-up for Phone Call        would need OV as this is a controlled substance Follow-up by: Corwin Levins MD,  May 15, 2010 3:09 PM  Additional Follow-up for Phone Call Additional follow up Details #1::        pt informed  Additional Follow-up by: Lanier Prude, [facility]),  May 15, 2010 4:10 PM

## 2010-08-27 NOTE — Letter (Signed)
Summary: Heart study  result w labs/[facility]  Heart study  result w labs/[facility]   Imported By: Lester [place] 04/01/2009 09:34:58  _____________________________________________________________________  External Attachment:    Type:   Image     Comment:   External Document

## 2010-08-27 NOTE — Letter (Signed)
Summary: Patient instructions/[facility]   Imported By: Lester [place] 07/08/2010 10:10:16  _____________________________________________________________________  External Attachment:    Type:   Image     Comment:   External Document

## 2010-08-27 NOTE — Assessment & Plan Note (Signed)
Summary: problems urinating/offered sda - requested this day/cd   Vital Signs:  Patient profile:   40 year old female Height:      61.5 inches Weight:      255 pounds BMI:     47.57 O2 Sat:      97 % on Room air Temp:     98.5 degrees F oral Pulse rate:   78 / minute BP sitting:   124 / 86  (right arm) Cuff size:   large  Vitals Entered By: Zella Ball Ewing CMA  O2 Flow:  Room air CC: patient complains uriary incontinence/ RE   CC:  patient complains uriary incontinence/ RE.  History of Present Illness: here with 2 wks a sense of urinary retention , frequency and uncomfortable feeling with urgency, worse in the AM, has to sit for about 10 min, and relatviely small amounts come out , more first in theAM.  No fever, no back pain except for the usual back pain such as with the menstrual cycle;  no pother bowel change; no LE symptoims sucha s pain, weakenss, or numbness.  Did have some nausea, bu no vomiting. No wt loss.   No high fevers but has some increaesed during day and night.  No blood in the urine urine.  Cant remember last UTI, but has had a yeast infection/vaginiitis - rx iwth diflucan per GYN. CBG's at home in the low 100's.  Pt denies polydipsia, polyuria, or low sugar symptoms such as shakiness improved with eating.  Overall good compliance with meds, trying to follow low chol, DM diet, wt stable, little excercise however     Problems Prior to Update: 1)  Genital Herpes  (ICD-054.10) 2)  HIV Infection  (ICD-042) 3)  Metrorrhagia  (ICD-626.6) 4)  Uri  (ICD-465.9) 5)  Anxiety  (ICD-300.00) 6)  Preventive Health Care  (ICD-V70.0) 7)  Bronchitis, Acute  (ICD-466.0) 8)  Elevated Blood Pressure Without Diagnosis of Hypertension  (ICD-796.2) 9)  Asthmatic Bronchitis, Acute  (ICD-466.0) 10)  Hyperlipidemia  (ICD-272.4) 11)  Diabetes Mellitus, Type II  (ICD-250.00)  Medications Prior to Update: 1)  Alprazolam 0.25 Mg Tabs (Alprazolam) .... Take 1 Tablet By Mouth Three Times A Day  2)  Metformin Hcl 500 Mg  Tabs (Metformin Hcl) .Marland Kitchen.. 1po Once Daily 3)  Adult Aspirin Ec Low Strength 81 Mg  Tbec (Aspirin) .Marland Kitchen.. 1 By Mouth Qd 4)  Accu-Chek Aviva  Strp (Glucose Blood) .... Use Daily To Check Glucose  Current Medications (verified): 1)  Alprazolam 0.25 Mg Tabs (Alprazolam) .... Take 1 Tablet By Mouth Three Times A Day 2)  Metformin Hcl 500 Mg  Tabs (Metformin Hcl) .Marland Kitchen.. 1po Once Daily 3)  Adult Aspirin Ec Low Strength 81 Mg  Tbec (Aspirin) .Marland Kitchen.. 1 By Mouth Qd 4)  Accu-Chek Aviva  Strp (Glucose Blood) .... Use Daily To Check Glucose 5)  Ciprofloxacin Hcl 500 Mg Tabs (Ciprofloxacin Hcl) .Marland Kitchen.. 1po Two Times A Day  Allergies (verified): 1)  ! 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   Head:  normocephalic, atraumatic, and no abnormalities observed.   Eyes:  vision grossly intact, pupils equal, and pupils round.   Ears:  R ear normal and L ear normal.   Nose:  no external deformity and no nasal discharge.   Mouth:  no gingival abnormalities and pharynx pink and moist.   Neck:  supple and no masses.   Lungs:  normal respiratory effort and normal breath sounds.   Heart:  normal rate and regular rhythm.   Abdomen:  non-tender, normal bowel sounds, no distention, and no masses.  except for mild tender to the lower mid abd without guarding or rebound Msk:  no spine tender throughout Extremities:  no edema, no erythema   Neurologic:  cranial nerves II-XII intact and strength normal in all extremities.      Impression & Recommendations:  Problem # 1:  RETENTION, URINE (ICD-788.20) with freq and urgency ' suspect low grade UTI but cant r/o other bladder issue, will check urine studies, consider urology eval, doubt spine problem at this time such as radiculopathy; tx empirically wtih cipro course; pt to call with any worsening s/s or if not improved for GU consult  Problem # 2:  DIABETES MELLITUS, TYPE II (ICD-250.00)  Her updated medication list for this problem includes:    Metformin Hcl 500 Mg Tabs (Metformin hcl) .Marland Kitchen... 1po once daily    Adult Aspirin Ec Low Strength 81 Mg Tbec (Aspirin) .Marland Kitchen... 1 by mouth qd  Labs Reviewed: Creat: 0.7 (11/27/2007)    Reviewed HgBA1c results: 6.4 (03/12/2009)  6.5 (11/27/2007) .stable  Problem # 3:  ELEVATED BLOOD PRESSURE WITHOUT DIAGNOSIS OF HYPERTENSION (ICD-796.2) good today - ok to follow; cont efforts at wt loss, low salt diet, ETOH in moderation and more activity  Complete Medication List: 1)  Alprazolam 0.25 Mg Tabs (Alprazolam) .... Take 1 tablet by mouth three times a day 2)  Metformin Hcl 500 Mg Tabs (Metformin hcl) .Marland Kitchen.. 1po once daily 3)  Adult Aspirin Ec Low Strength 81 Mg Tbec (Aspirin) .Marland Kitchen.. 1 by mouth qd 4)  Accu-chek Aviva Strp (Glucose blood) .... Use daily to check glucose 5)  Ciprofloxacin Hcl 500 Mg Tabs (Ciprofloxacin hcl) .Marland Kitchen.. 1po two times a day  Other Orders: T-Culture, Urine ([id number]) TLB-Udip w/ Micro ([id number])  Patient Instructions: 1)  Please take all new medications as prescribed 2)  Continue all previous medications as before this visit 3)  Please go to the Lab in the basement for your urine tests today   4)  Please schedule a follow-up appointment as recommended at your last office visit Prescriptions: CIPROFLOXACIN HCL 500 MG TABS (CIPROFLOXACIN HCL) 1po two times a day  #20 x 0   Entered and Authorized by:   Corwin Levins MD   Signed by:   Corwin Levins MD on 05/19/2009   Method used:   Print then Give to Patient   RxID:   [id number]

## 2010-08-27 NOTE — Assessment & Plan Note (Signed)
Summary: LOWER STOMACH BOIL--STC   Vital Signs:  Patient profile:   40 year old female Height:      63 inches Weight:      253.50 pounds BMI:     45.07 O2 Sat:      97 % on Room air Temp:     100.3 degrees F oral Pulse rate:   87 / minute BP sitting:   112 / 70  (left arm) Cuff size:   large  Vitals Entered By: Zella Ball Ewing CMA Duncan Dull) (April 16, 2010 1:57 PM)  O2 Flow:  Room air CC: Boil on stomach/RE   CC:  Boil on stomach/RE.  History of Present Illness: here with acute - c/o new 2-3 days mild to mod onset boil to abd in the left lower side;  drained yesterday but still painful, red , and swollen;  no fever o r red streaks.  Pt denies polydipsia, polyuria, or low sugar symptoms such as shakiness improved with eating.  Overall good compliance with meds, trying to follow low chol, DM diet, wt stable, little excercise however   Problems Prior to Update: 1)  Carbuncle and Furuncle of Trunk  (ICD-680.2) 2)  Edema  (ICD-782.3) 3)  Anemia-iron Deficiency  (ICD-280.9) 4)  Vitamin D Deficiency  (ICD-268.9) 5)  Frequency, Urinary  (ICD-788.41) 6)  Retention, Urine  (ICD-788.20) 7)  Genital Herpes  (ICD-054.10) 8)  HIV Infection  (ICD-042) 9)  Metrorrhagia  (ICD-626.6) 10)  Uri  (ICD-465.9) 11)  Anxiety  (ICD-300.00) 12)  Preventive Health Care  (ICD-V70.0) 13)  Bronchitis, Acute  (ICD-466.0) 14)  Elevated Blood Pressure Without Diagnosis of Hypertension  (ICD-796.2) 15)  Asthmatic Bronchitis, Acute  (ICD-466.0) 16)  Hyperlipidemia  (ICD-272.4) 17)  Diabetes Mellitus, Type II  (ICD-250.00)  Medications Prior to Update: 1)  Alprazolam 0.25 Mg Tabs (Alprazolam) .... Take 1 Tablet By Mouth Three Times A Day 2)  Metformin Hcl 500 Mg  Tabs (Metformin Hcl) .Marland Kitchen.. 1po Once Daily 3)  Adult Aspirin Ec Low Strength 81 Mg  Tbec (Aspirin) .Marland Kitchen.. 1 By Mouth Qd 4)  Accu-Chek Aviva  Strp (Glucose Blood) .... Use Daily To Check Glucose 5)  Proventil Hfa 108 (90 Base) Mcg/act Aers (Albuterol  Sulfate) .... Inhale 2 Puffs As Directed Four Times Every Day As Needed 6)  Tylenol Extra Strength 500 Mg Tabs (Acetaminophen) .... As Needed 7)  Motrin Ib 200 Mg Tabs (Ibuprofen) .... As Needed 8)  Rey[place]z 300 Mg Caps (Atazanavir Sulfate) .Marland Kitchen.. 1 Cap Daily 9)  Vitamin D3 1000 Unit Tabs (Cholecalciferol) .Marland Kitchen.. 1 By Mouth Once Daily 10)  Hydrochlorothiazide 12.5 Mg Caps (Hydrochlorothiazide) .Marland Kitchen.. 1 By Mouth Once Daily  Current Medications (verified): 1)  Alprazolam 0.25 Mg Tabs (Alprazolam) .... Take 1 Tablet By Mouth Three Times A Day 2)  Metformin Hcl 500 Mg  Tabs (Metformin Hcl) .Marland Kitchen.. 1po Once Daily 3)  Adult Aspirin Ec Low Strength 81 Mg  Tbec (Aspirin) .Marland Kitchen.. 1 By Mouth Qd 4)  Accu-Chek Aviva  Strp (Glucose Blood) .... Use Daily To Check Glucose 5)  Proventil Hfa 108 (90 Base) Mcg/act Aers (Albuterol Sulfate) .... Inhale 2 Puffs As Directed Four Times Every Day As Needed 6)  Tylenol Extra Strength 500 Mg Tabs (Acetaminophen) .... As Needed 7)  Motrin Ib 200 Mg Tabs (Ibuprofen) .... As Needed 8)  Re[place]z 300 Mg Caps (Atazanavir Sulfate) .Marland Kitchen.. 1 Cap Daily 9)  Vitamin D3 1000 Unit Tabs (Cholecalciferol) .Marland Kitchen.. 1 By Mouth Once Daily 10)  Hydrochlorothiazide 12.5 Mg Caps (  Hydrochlorothiazide) .Marland Kitchen.. 1 By Mouth Once Daily 11)  Bactrim Ds 800-160 Mg Tabs (Sulfamethoxazole-Trimethoprim) .Marland Kitchen.. 1po Two Times A Day 12)  Hydrocodone-Acetaminophen 5-325 Mg Tabs (Hydrocodone-Acetaminophen) .Marland Kitchen.. 1po Q 6 Hrs As Needed Pain  Allergies (verified): 1)  ! 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  , non toxic Head:  normocephalic and atraumatic.   Eyes:  vision grossly intact, pupils equal, and pupils round.   Ears:  R ear normal and L ear normal.   Nose:  no external deformity and no nasal discharge.   Mouth:  no gingival abnormalities and pharynx pink and moist.   Neck:  supple and no masses.   Lungs:  normal respiratory effort and normal breath sounds.   Heart:  normal rate and regular rhythm.   Extremities:  no edema, no erythema  Skin:  LLQ abd with 1 cm area superfic induration , red, tedner adn mild sweling, but no fluctuance or drainage   Impression & Recommendations:  Problem # 1:  CARBUNCLE AND FURUNCLE OF TRUNK (ICD-680.2) to the LLQ abd - small superficial, non fluctuant - small pus exprressed today;  ok for bactirm course, f/u any worsening symptoms  Problem # 2:  DIABETES MELLITUS, TYPE II (ICD-250.00)  Her updated medication list for this problem includes:    Metformin Hcl 500 Mg Tabs (Metformin hcl) .Marland Kitchen... 1po once daily    Adult Aspirin Ec Low Strength 81 Mg Tbec (Aspirin) .Marland Kitchen... 1 by mouth qd  Labs Reviewed: Creat: 0.6 (01/30/2010)    Reviewed HgBA1c results: 6.5 (01/30/2010)  6.4 (03/12/2009) stable overall by hx and exam, ok to continue meds/tx as is , Pt to cont DM diet, excercise, wt loss efforts; to check labs next visit - declines today  Complete Medication List: 1)  Alprazolam 0.25 Mg Tabs (Alprazolam) .... Take 1 tablet by mouth three times a day 2)  Metformin Hcl 500 Mg Tabs (Metformin hcl) .Marland Kitchen.. 1po once daily 3)  Adult Aspirin Ec Low Strength 81 Mg Tbec (Aspirin) .Marland Kitchen.. 1 by mouth qd 4)  Accu-chek Aviva Strp (Glucose blood) .... Use daily to check glucose 5)  Proventil Hfa 108 (90 Base) Mcg/act Aers (Albuterol sulfate) .... Inhale 2 puffs as directed four times every day as  needed 6)  Tylenol Extra Strength 500 Mg Tabs (Acetaminophen) .... As needed 7)  Motrin Ib 200 Mg Tabs (Ibuprofen) .... As needed 8)  Rey[place]z 300 Mg Caps (Atazanavir sulfate) .Marland Kitchen.. 1 cap daily 9)  Vitamin D3 1000 Unit Tabs (Cholecalciferol) .Marland Kitchen.. 1 by mouth once daily 10)  Hydrochlorothiazide 12.5 Mg Caps (Hydrochlorothiazide) .Marland Kitchen.. 1 by mouth once daily 11)  Bactrim Ds 800-160 Mg Tabs (Sulfamethoxazole-trimethoprim) .Marland Kitchen.. 1po two times a day 12)  Hydrocodone-acetaminophen 5-325 Mg Tabs (Hydrocodone-acetaminophen) .Marland Kitchen.. 1po q 6 hrs as needed pain  Patient Instructions: 1)  Please take all new medications as prescribed  2)  Continue all previous medications as before this visit 3)  Please schedule a follow-up appointment in 4  months with: 4)  BMP prior to visit, ICD-9:250.02 5)  Lipid Panel prior to visit, ICD-9: 6)  HbgA1C prior to visit, ICD-9: Prescriptions: HYDROCODONE-ACETAMINOPHEN 5-325 MG TABS (HYDROCODONE-ACETAMINOPHEN) 1po q 6 hrs as needed pain  #50 x 0   Entered and Authorized by:   Corwin Levins MD   Signed by:   Corwin Levins MD on 04/16/2010   Method used:   Print then Give to Patient   RxID:   [id number] BACTRIM DS 800-160 MG TABS (SULFAMETHOXAZOLE-TRIMETHOPRIM) 1po two times a day  #20 x 0   Entered and Authorized by:   Corwin Levins MD   Signed by:   Corwin Levins MD on 04/16/2010   Method used:   Print then Give to Patient   RxID:   [id number]

## 2010-08-27 NOTE — Miscellaneous (Signed)
Summary: Orders Update  Clinical Lists Changes  Orders: Added new Service order of No Show NS50 (NS50) - Signed 

## 2010-08-27 NOTE — Letter (Signed)
Summary: OV  HIV + 2009/[facility]  OV  HIV + 2009/[facility]   Imported By: Lester Churchill 11/30/2008 09:15:39  _____________________________________________________________________  External Attachment:    Type:   Image     Comment:   External Document

## 2010-08-27 NOTE — Assessment & Plan Note (Signed)
Summary: CPX /  $50 /[facility]   Vital Signs:  Patient profile:   40 year old female Height:      52 inches Weight:      254.13 pounds BMI:     66.32 O2 Sat:      98 % Temp:     98.4 degrees F oral Pulse rate:   95 / minute BP sitting:   92 / 62  (right arm) Cuff size:   large  Vitals Entered By: Windell Norfolk (March 12, 2009 1:25 PM) CC: cpx   CC:  cpx.  History of Present Illness: overall doing well, no specific complaints; Pt denies CP, sob, doe, wheezing, orthopnea, pnd, worsening LE edema, palps, dizziness or syncope .  Pt denies new neuro symptoms such as headache, facial or extremity weakness  Pt denies polydipsia, polyuria, or low sugar symptoms such as shakiness improved with eating.  Overall good compliance with meds, trying to follow low chol, DM diet, wt stable, little excercise however   Problems Prior to Update: 1)  Genital Herpes  (ICD-054.10) 2)  HIV Infection  (ICD-042) 3)  Metrorrhagia  (ICD-626.6) 4)  Uri  (ICD-465.9) 5)  Anxiety  (ICD-300.00) 6)  Preventive Health Care  (ICD-V70.0) 7)  Bronchitis, Acute  (ICD-466.0) 8)  Elevated Blood Pressure Without Diagnosis of Hypertension  (ICD-796.2) 9)  Asthmatic Bronchitis, Acute  (ICD-466.0) 10)  Hyperlipidemia  (ICD-272.4) 11)  Diabetes Mellitus, Type II  (ICD-250.00)  Medications Prior to Update: 1)  Alprazolam 0.25 Mg Tabs (Alprazolam) .... Take 1 Tablet By Mouth Three Times A Day 2)  Metformin Hcl 500 Mg  Tabs (Metformin Hcl) .Marland Kitchen.. 1po Once Daily- No Addtnl Refills Pt Overdue For An Appt 3)  Adult Aspirin Ec Low Strength 81 Mg  Tbec (Aspirin) .Marland Kitchen.. 1 By Mouth Qd 4)  Tussionex Pennkinetic Er 8-10 Mg/64ml Lqcr (Chlorpheniramine-Hydrocodone) .... 0ne Tsp Two Times A Day As Needed Cough 5)  Accu-Chek Aviva  Strp (Glucose Blood) .... Use Daily To Check Glucose  Current Medications (verified): 1)  Alprazolam 0.25 Mg Tabs (Alprazolam) .... Take 1 Tablet By Mouth Three Times A Day 2)  Metformin Hcl 500 Mg  Tabs (Metformin  Hcl) .Marland Kitchen.. 1po Once Daily 3)  Adult Aspirin Ec Low Strength 81 Mg  Tbec (Aspirin) .Marland Kitchen.. 1 By Mouth Qd 4)  Accu-Chek Aviva  Strp (Glucose Blood) .... Use Daily To Check Glucose  Allergies (verified): 1)  ! 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 Cholecystectomy Tubal ligation  Review of Systems  The patient denies anorexia, fever, weight loss, weight gain, vision loss, decreased hearing, hoarseness, chest pain, syncope, dyspnea on exertion, peripheral edema, prolonged cough, headaches, hemoptysis, abdominal pain, melena, hematochezia, severe indigestion/heartburn, hematuria, incontinence, muscle weakness, suspicious skin lesions, transient blindness, difficulty walking, depression, unusual weight change, abnormal bleeding, enlarged lymph nodes, angioedema, and breast masses.         here with paresthesias to leff leg below the knee  but no pain or weakness  Physical Exam  General:  alert and overweight-appearing.   Head:  normocephalic and atraumatic.   Eyes:  vision grossly  intact, pupils equal, and pupils round.   Ears:  R ear normal and L ear normal.   Nose:  no external deformity and no nasal discharge.   Mouth:  no gingival abnormalities and pharynx pink and moist.   Neck:  supple and no masses.   Lungs:  normal respiratory effort and normal breath sounds.   Heart:  normal rate and regular rhythm.   Abdomen:  soft, non-tender, and normal bowel sounds.   Msk:  normal ROM, no joint tenderness, and no joint  swelling.   Extremities:  trace LLE edema/varicosities noted Neurologic:  cranial nerves II-XII intact and strength normal in all extremities.     Impression & Recommendations:  Problem # 1:  Preventive Health Care (ICD-V70.0) Overall doing well, up to date, counseled on routine health concerns for screening and prevention, immunizations up to date or declined, labs reviewed from june 2010  - none further felt needed today for this purpose  Problem # 2:  DIABETES MELLITUS, TYPE II (ICD-250.00)  Her updated medication list for this problem includes:    Metformin Hcl 500 Mg Tabs (Metformin hcl) .Marland Kitchen... 1po once daily    Adult Aspirin Ec Low Strength 81 Mg Tbec (Aspirin) .Marland Kitchen... 1 by mouth qd  Orders: TLB-A1C / Hgb A1C (Glycohemoglobin) ([id number]-[id number])  Labs Reviewed: Creat: 0.7 (11/27/2007)    Reviewed HgBA1c results: 6.5 (11/27/2007)  6.3 (03/07/2007) stable overall by hx and exam, ok to continue meds/tx as is , Pt to cont DM diet, excercise, wt loss efforts; to check labs today   Complete Medication List: 1)  Alprazolam 0.25 Mg Tabs (Alprazolam) .... Take 1 tablet by mouth three times a day 2)  Metformin Hcl 500 Mg Tabs (Metformin hcl) .Marland Kitchen.. 1po once daily 3)  Adult Aspirin Ec Low Strength 81 Mg Tbec (Aspirin) .Marland Kitchen.. 1 by mouth qd 4)  Accu-chek Aviva Strp (Glucose blood) .... Use daily to check glucose  Other Orders: Tdap => 11yrs IM (52841) Admin 1st Vaccine ([id number])  Patient Instructions: 1)  you had the tetanus shot today 2)  Continue all previous medications as before this visit  3)  you are given the refills today 4)  Please go to the Lab in the basement for your blood and/or urine tests today  5)  please keep your specialist appts as you do 6)  Please schedule a follow-up appointment in 6 months with : 7)  BMP prior to visit, ICD-9: 250.02 8)  Lipid Panel prior to visit, ICD-9: 9)  HbgA1C prior to visit, ICD-9: Prescriptions: ACCU-CHEK AVIVA  STRP (GLUCOSE BLOOD) Use daily to  check glucose  #100 x 11   Entered and Authorized by:   Corwin Levins MD   Signed by:   Corwin Levins MD on 03/12/2009   Method used:   Print then Give to Patient   RxID:   [id number] METFORMIN HCL 500 MG  TABS (METFORMIN HCL) 1po once daily  #90 x 3   Entered and Authorized by:   Corwin Levins MD   Signed by:   Corwin Levins MD on 03/12/2009   Method used:   Print then Give to Patient   RxID:   [id number]    Immunizations Administered:  Tetanus Vaccine:    Vaccine Type: Tdap    Site: right deltoid    Mfr: [employer]oSmith[employer]    Dose: 0.5 ml    Route: IM    Given by: Windell Norfolk    Exp. Date: 05/29/2011  Lot #: [id number]

## 2010-08-27 NOTE — Assessment & Plan Note (Signed)
Summary: SORE THROAT/CONGESTION /NWS   Vital Signs:  Patient Profile:   40 Years Old Female Weight:      264 pounds Temp:     99.9 degrees F oral Pulse rate:   86 / minute BP sitting:   115 / 80  (right arm) Cuff size:   large  Pt. in pain?   no  Vitals Entered By: Maris Berger (October 12, 2007 2:19 PM)                  Chief Complaint:  congestion and sore throat.    Updated Prior Medication List: ALPRAZOLAM 0.25 MG TABS (ALPRAZOLAM) Take 1 tablet by mouth three times a day PROVENTIL HFA 108 (90 BASE) MCG/ACT AERS (ALBUTEROL SULFATE) Inhale 2 puff as directed four times a day PROMETHAZINE-CODEINE 6.25-10 MG/5ML  SYRP (PROMETHAZINE-CODEINE) 1 tsp by mouth qid prn AZITHROMYCIN 250 MG  TABS (AZITHROMYCIN) 2po qd for 1 day, then 1po qd for 4days, then stop PROMETHAZINE-CODEINE 6.25-10 MG/5ML  SYRP (PROMETHAZINE-CODEINE) 1 tsp by mouth qid prn  Current Allergies (reviewed today): ! PENICILLIN        Impression & Recommendations:  Problem # 1:  BRONCHITIS, ACUTE (ICD-466.0)  The following medications were removed from the medication list:    Azithromycin 250 Mg Tabs (Azithromycin) .Marland Kitchen... 2po qd for 1 day, then 1po qd for 4days, then stop  Her updated medication list for this problem includes:    Proventil Hfa 108 (90 Base) Mcg/act Aers (Albuterol sulfate) ..... Inhale 2 puff as directed four times a day    Promethazine-codeine 6.25-10 Mg/27ml Syrp (Promethazine-codeine) .Marland Kitchen... 1 tsp by mouth qid prn    Azithromycin 250 Mg Tabs (Azithromycin) .Marland Kitchen... 2po qd for 1 day, then 1po qd for 4days, then stop    Promethazine-codeine 6.25-10 Mg/52ml Syrp (Promethazine-codeine) .Marland Kitchen... 1 tsp by mouth qid as needed  tx as above, no steroid needed this time, off work note for today and tomorrow  The following medications were removed from the medication list:    Azithromycin 250 Mg Tabs (Azithromycin) .Marland Kitchen... 2po qd for 1 day, then 1po qd for 4days, then stop  Her updated  medication list for this problem includes:    Proventil Hfa 108 (90 Base) Mcg/act Aers (Albuterol sulfate) ..... Inhale 2 puff as directed four times a day    Promethazine-codeine 6.25-10 Mg/60ml Syrp (Promethazine-codeine) .Marland Kitchen... 1 tsp by mouth qid prn    Azithromycin 250 Mg Tabs (Azithromycin) .Marland Kitchen... 2po qd for 1 day, then 1po qd for 4days, then stop    Promethazine-codeine 6.25-10 Mg/55ml Syrp (Promethazine-codeine) .Marland Kitchen... 1 tsp by mouth qid prn   Problem # 2:  HYPERLIPIDEMIA (ICD-272.4)  Labs Reviewed: Chol: 198 (03/07/2007)   HDL: 52.4 (03/07/2007)   LDL: 123 (03/07/2007)   TG: 115 (03/07/2007) SGOT: 27 (01/23/2007)   SGPT: 23 (01/23/2007)  d/w pt - to follow low chol diet - consider statin next viist  Problem # 3:  DIABETES MELLITUS, TYPE II (ICD-250.00)  Labs Reviewed: HgBA1c: 6.3 (03/07/2007)   Creat: 0.6 (03/07/2007)     stable,cont meds as is, f/u next visit  Complete Medication List: 1)  Alprazolam 0.25 Mg Tabs (Alprazolam) .... Take 1 tablet by mouth three times a day 2)  Proventil Hfa 108 (90 Base) Mcg/act Aers (Albuterol sulfate) .... Inhale 2 puff as directed four times a day 3)  Promethazine-codeine 6.25-10 Mg/28ml Syrp (Promethazine-codeine) .Marland Kitchen.. 1 tsp by mouth qid prn 4)  Azithromycin 250 Mg Tabs (Azithromycin) .... 2po qd for 1  day, then 1po qd for 4days, then stop 5)  Promethazine-codeine 6.25-10 Mg/17ml Syrp (Promethazine-codeine) .Marland Kitchen.. 1 tsp by mouth qid prn   Patient Instructions: 1)  take all medications as prescribed 2)  continue all other medications that you may have been taking previously 3)  Please schedule a follow-up appointment in 3 months with CPX labs and: 4)  HbgA1C prior to visit, ICD-9 :250.02 5)  Urine Microalbumin prior to visit, ICD-9:    Prescriptions: PROMETHAZINE-CODEINE 6.25-10 MG/5ML  SYRP (PROMETHAZINE-CODEINE) 1 tsp by mouth qid prn  #8 oz x 1   Entered and Authorized by:   Corwin Levins MD   Signed by:   Corwin Levins MD on 10/12/2007    Method used:   Print then Give to Patient   RxID:   [id number] AZITHROMYCIN 250 MG  TABS (AZITHROMYCIN) 2po qd for 1 day, then 1po qd for 4days, then stop  #6 x 1   Entered and Authorized by:   Corwin Levins MD   Signed by:   Corwin Levins MD on 10/12/2007   Method used:   Print then Give to Patient   RxID:   [id number]  ]

## 2010-08-27 NOTE — Assessment & Plan Note (Signed)
Summary: SORE THROAT/ NO FEVER/ NWS #   Vital Signs:  Patient profile:   40 year old female Height:      63 inches Weight:      257.25 pounds BMI:     45.73 O2 Sat:      98 % on Room air Temp:     99.2 degrees F oral Pulse rate:   84 / minute BP sitting:   122 / 88  (left arm) Cuff size:   large  Vitals Entered ByZella Ball Ewing (October 08, 2009 11:03 AM)  O2 Flow:  Room air CC: sore throat, nasal congestion, cough, headaches/RE   CC:  sore throat, nasal congestion, cough, and headaches/RE.  History of Present Illness: here with acute onset fever, mild to mod x 3 days ST, prod cough with greenish sputum, but  without wheezing and Pt denies CP, sob, doe, wheezing, orthopnea, pnd, worsening LE edema, palps, dizziness or syncope   denies polydipsia, polyuria  or recent low sugars.  Overall good sugar control recnet per pt, no orthostasis symtpoms.    Problems Prior to Update: 1)  Bronchitis-acute  (ICD-466.0) 2)  Frequency, Urinary  (ICD-788.41) 3)  Retention, Urine  (ICD-788.20) 4)  Genital Herpes  (ICD-054.10) 5)  HIV Infection  (ICD-042) 6)  Metrorrhagia  (ICD-626.6) 7)  Uri  (ICD-465.9) 8)  Anxiety  (ICD-300.00) 9)  Preventive Health Care  (ICD-V70.0) 10)  Bronchitis, Acute  (ICD-466.0) 11)  Elevated Blood Pressure Without Diagnosis of Hypertension  (ICD-796.2) 12)  Asthmatic Bronchitis, Acute  (ICD-466.0) 13)  Hyperlipidemia  (ICD-272.4) 14)  Diabetes Mellitus, Type II  (ICD-250.00)  Medications Prior to Update: 1)  Alprazolam 0.25 Mg Tabs (Alprazolam) .... Take 1 Tablet By Mouth Three Times A Day 2)  Metformin Hcl 500 Mg  Tabs (Metformin Hcl) .Marland Kitchen.. 1po Once Daily 3)  Adult Aspirin Ec Low Strength 81 Mg  Tbec (Aspirin) .Marland Kitchen.. 1 By Mouth Qd 4)  Accu-Chek Aviva  Strp (Glucose Blood) .... Use Daily To Check Glucose 5)  Ciprofloxacin Hcl 500 Mg Tabs (Ciprofloxacin Hcl) .Marland Kitchen.. 1po Two Times A Day  Current Medications (verified): 1)  Alprazolam 0.25 Mg Tabs (Alprazolam) ....  Take 1 Tablet By Mouth Three Times A Day 2)  Metformin Hcl 500 Mg  Tabs (Metformin Hcl) .Marland Kitchen.. 1po Once Daily 3)  Adult Aspirin Ec Low Strength 81 Mg  Tbec (Aspirin) .Marland Kitchen.. 1 By Mouth Qd 4)  Accu-Chek Aviva  Strp (Glucose Blood) .... Use Daily To Check Glucose 5)  Azithromycin 250 Mg Tabs (Azithromycin) .... 2po Qd For 1 Day, Then 1po Qd For 4days, Then Stop 6)  Hydrocodone-Homatropine 5-1.5 Mg/6ml Syrp (Hydrocodone-Homatropine) .Marland Kitchen.. 1 Tsp By Mouth Q 6 Hrs As Needed Cough  Allergies (verified): 1)  ! 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  , mild ill  Head:  normocephalic and atraumatic.   Eyes:  vision grossly intact, pupils equal, and pupils round.   Ears:  bilat tms' mild erythema, sinus nontender Nose:  nasal dischargemucosal pallor and mucosal  edema.   Mouth:  pharyngeal erythema and fair dentition.   Neck:  supple and cervical lymphadenopathy.   Lungs:  normal respiratory effort and normal breath sounds.   Heart:  normal rate and regular rhythm.   Extremities:  no edema, no erythema    Impression & Recommendations:  Problem # 1:  BRONCHITIS-ACUTE (ICD-466.0)  Her updated medication list for this problem includes:    Azithromycin 250 Mg Tabs (Azithromycin) .Marland Kitchen... 2po qd for 1 day, then 1po qd for 4days, then stop    Hydrocodone-homatropine 5-1.5 Mg/49ml Syrp (Hydrocodone-homatropine) .Marland Kitchen... 1 tsp by mouth q 6 hrs as needed cough treat as above, f/u any  worsening signs or symptoms   Problem # 2:  DIABETES MELLITUS, TYPE II (ICD-250.00)  Her updated medication list for this problem includes:    Metformin Hcl 500 Mg Tabs (Metformin hcl) .Marland Kitchen... 1po once daily    Adult Aspirin Ec Low Strength 81 Mg Tbec (Aspirin) .Marland Kitchen... 1 by mouth qd  Labs Reviewed: Creat: 0.7 (11/27/2007)    Reviewed HgBA1c results: 6.4 (03/12/2009)  6.5 (11/27/2007) stable overall by hx and exam, ok to continue meds/tx as is, pt to call for cbg > 200  Complete Medication List: 1)  Alprazolam 0.25 Mg Tabs (Alprazolam) .... Take 1 tablet by mouth three times a day 2)  Metformin Hcl 500 Mg Tabs (Metformin hcl) .Marland Kitchen.. 1po once daily 3)  Adult Aspirin Ec Low Strength 81 Mg Tbec (Aspirin) .Marland Kitchen.. 1 by mouth qd 4)  Accu-chek Aviva Strp (Glucose blood) .... Use daily to check glucose 5)  Azithromycin 250 Mg Tabs (Azithromycin) .... 2po qd for 1 day, then 1po qd for 4days, then stop 6)  Hydrocodone-homatropine 5-1.5 Mg/76ml Syrp (Hydrocodone-homatropine) .Marland Kitchen.. 1 tsp by mouth q 6 hrs as needed cough  Patient Instructions: 1)  Please take all new medications as prescribed 2)  Continue all previous medications as before this visit  3)  You can also use Mucinex OTC or it's generic for congestion  4)  Please schedule an appointment with your primary doctor as needed Prescriptions: HYDROCODONE-HOMATROPINE 5-1.5 MG/5ML SYRP (HYDROCODONE-HOMATROPINE) 1 tsp by mouth q 6 hrs as needed cough  #6 oz x 1   Entered and Authorized by:   Corwin Levins MD   Signed by:   Corwin Levins MD on 10/08/2009   Method used:   Print then Give to Patient   RxID:   [id number] AZITHROMYCIN 250 MG TABS (AZITHROMYCIN) 2po qd for 1 day, then 1po qd for 4days, then stop  #6 x 1   Entered and Authorized by:   Corwin Levins MD   Signed by:   Corwin Levins MD on 10/08/2009   Method used:   Print then Give to Patient   RxID:   [id number]

## 2010-08-27 NOTE — Letter (Signed)
Summary: Primary Care Appointment Letter  [facility]  [address] [place], [place] [address]   Phone: [phone]  Fax: [phone]    07/03/2010 MRN: [id number]  [facility] [address] [place], [place]  [address]  Dear Ms. Millay,   Your Primary Care Physician Corwin Levins MD has indicated that:    _______it is time to schedule an appointment.    _______you missed your appointment on______ and need to call and          reschedule.    _______you need to have lab work done.    _______you need to schedule an appointment discuss lab or test results.    ___X____you need to call to reschedule your appointment that is                       scheduled on Jan. 19, 2012 with Dr. Jonny Ruiz.  Please call the office.     Please call our office as soon as possible. Our phone number is [phone]. Please press option 1. Our office is open 8a-12noon and 1p-5p, Monday through Friday.     Thank you,    [facility]

## 2010-08-27 NOTE — Assessment & Plan Note (Signed)
Summary: FU/ LEG IS SWOLLELN/NWS  #   Vital Signs:  Patient profile:   40 year old female Height:      63 inches Weight:      254 pounds BMI:     45.16 O2 Sat:      98 % on Room air Temp:     98.5 degrees F oral Pulse rate:   75 / minute BP sitting:   102 / 72  (left arm) Cuff size:   regular  Vitals Entered By: Bill Salinas CMA (January 30, 2010 8:33 AM)  O2 Flow:  Room air  Preventive Care Screening  Mammogram:    Date:  01/20/2010    Results:  normal   Pap Smear:    Date:  01/20/2010    Results:  normal   CC: pt here with c/o swelling in left leg/ ab   CC:  pt here with c/o swelling in left leg/ ab.  History of Present Illness: here with c/o mild increase bialt LE swelling, gradual over the last several weeks without incresed by mouth intake, overt blood loss, hyper or hypothyorid like symtpoms and Pt denies CP, sob, doe, wheezing, orthopnea, pnd, palps, dizziness or syncope .  Pt denies new neuro symptoms such as headache, facial or extremity weakness  No fever, wt loss, night sweats, loss of appetite or other constitutional symptoms Denies polydipsia, polyuria.  Has hx of anemia, no known hx of renal insuff, Cardiomyopathy or liver dz.  Has recent found rather large uterine fibroid per pt report.  Denies polydipsia or polyuria  Preventive Screening-Counseling & Management      Drug Use:  no.    Problems Prior to Update: 1)  Edema  (ICD-782.3) 2)  Anemia-iron Deficiency  (ICD-280.9) 3)  Vitamin D Deficiency  (ICD-268.9) 4)  Frequency, Urinary  (ICD-788.41) 5)  Retention, Urine  (ICD-788.20) 6)  Genital Herpes  (ICD-054.10) 7)  HIV Infection  (ICD-042) 8)  Metrorrhagia  (ICD-626.6) 9)  Uri  (ICD-465.9) 10)  Anxiety  (ICD-300.00) 11)  Preventive Health Care  (ICD-V70.0) 12)  Bronchitis, Acute  (ICD-466.0) 13)  Elevated Blood Pressure Without Diagnosis of Hypertension  (ICD-796.2) 14)  Asthmatic Bronchitis, Acute  (ICD-466.0) 15)  Hyperlipidemia  (ICD-272.4) 16)   Diabetes Mellitus, Type II  (ICD-250.00)  Medications Prior to Update: 1)  Alprazolam 0.25 Mg Tabs (Alprazolam) .... Take 1 Tablet By Mouth Three Times A Day 2)  Metformin Hcl 500 Mg  Tabs (Metformin Hcl) .Marland Kitchen.. 1po Once Daily 3)  Adult Aspirin Ec Low Strength 81 Mg  Tbec (Aspirin) .Marland Kitchen.. 1 By Mouth Qd 4)  Accu-Chek Aviva  Strp (Glucose Blood) .... Use Daily To Check Glucose 5)  Azithromycin 250 Mg Tabs (Azithromycin) .... 2po Qd For 1 Day, Then 1po Qd For 4days, Then Stop 6)  Hydrocodone-Homatropine 5-1.5 Mg/31ml Syrp (Hydrocodone-Homatropine) .Marland Kitchen.. 1 Tsp By Mouth Q 6 Hrs As Needed Cough 7)  Proventil Hfa 108 (90 Base) Mcg/act Aers (Albuterol Sulfate) .... Inhale 2 Puffs As Directed Four Times Every Day As Needed  Current Medications (verified): 1)  Alprazolam 0.25 Mg Tabs (Alprazolam) .... Take 1 Tablet By Mouth Three Times A Day 2)  Metformin Hcl 500 Mg  Tabs (Metformin Hcl) .Marland Kitchen.. 1po Once Daily 3)  Adult Aspirin Ec Low Strength 81 Mg  Tbec (Aspirin) .Marland Kitchen.. 1 By Mouth Qd 4)  Accu-Chek Aviva  Strp (Glucose Blood) .... Use Daily To Check Glucose 5)  Proventil Hfa 108 (90 Base) Mcg/act Aers (Albuterol Sulfate) .... Inhale  2 Puffs As Directed Four Times Every Day As Needed 6)  Tylenol Extra Strength 500 Mg Tabs (Acetaminophen) .... As Needed 7)  Motrin Ib 200 Mg Tabs (Ibuprofen) .... As Needed 8)  Reyataz 300 Mg Caps (Atazanavir Sulfate) .Marland Kitchen.. 1 Cap Daily 9)  Vitamin D3 1000 Unit Tabs (Cholecalciferol) .Marland Kitchen.. 1 By Mouth Once Daily 10)  Hydrochlorothiazide 12.5 Mg Caps (Hydrochlorothiazide) .Marland Kitchen.. 1 By Mouth Once Daily  Allergies (verified): 1)  ! 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 Cholecystectomy Tubal ligation  Social History: Reviewed history from 11/27/2007 and no changes required. 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         all otherwise negative per pt -   - for possible TAH soon due to fibroid and dysfunctional bleeding  Physical Exam  General:  alert and overweight-appearing.   Head:  normocephalic and atraumatic.   Eyes:  vision grossly intact, pupils equal, and pupils round.   Ears:  R ear normal and L ear normal.   Nose:  no external deformity and no nasal discharge.   Mouth:  no gingival abnormalities and pharynx pink and moist.   Neck:  supple and no masses.   Lungs:  normal respiratory effort and normal breath sounds.   Heart:  normal rate and regular rhythm.   Abdomen:  soft, non-tender, and normal bowel sounds.   Msk:  no joint tenderness and no joint swelling.   Extremities:  trace edema bilat to ankles, no erythema, no abscess or tenderness,  has numberous bilat varicosities LE's but small  Neurologic:  cranial nerves II-XII intact and strength normal in all extremities.   Skin:  color normal and no rashes.   Psych:  not depressed appearing and moderately anxious.     Impression & Recommendations:  Problem # 1:  Preventive  Health Care (ICD-V70.0)  Overall doing well, age appropriate education and counseling updated and referral for appropriate preventive services done unless declined, immunizations up to date or declined, diet counseling done if overweight, urged to quit smoking if smokes , most recent labs reviewed and current ordered if appropriate, ecg reviewed or declined (interpretation per ECG scanned in the EMR if done); information regarding Medicare Prevention requirements given if appropriate; speciality referrals updated as appropriate   Orders: TLB-BMP (Basic Metabolic Panel-BMET) (80048-METABOL) TLB-Hepatic/Liver Function Pnl (80076-HEPATIC) TLB-Lipid Panel (80061-LIPID) TLB-TSH (Thyroid Stimulating Hormone) (84443-TSH) TLB-Udip ONLY (81003-UDIP)  Problem # 2:  DIABETES MELLITUS, TYPE II (ICD-250.00)  Her updated medication list for this problem includes:    Metformin Hcl 500 Mg Tabs (Metformin hcl) .Marland Kitchen... 1po once daily    Adult Aspirin Ec Low Strength 81 Mg Tbec (Aspirin) .Marland Kitchen... 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) for f/u labs Orders: TLB-IBC Pnl (Iron/FE;Transferrin) (83550-IBC) TLB-B12 + Folate Pnl (78295_62130-Q65/HQI)  Problem # 4:  VITAMIN D DEFICIENCY (ICD-268.9) for vit d 1000 units per day  Problem # 5:  HYPERLIPIDEMIA (ICD-272.4)  Labs Reviewed: SGOT: 26 (11/27/2007)   SGPT: 24 (11/27/2007)   HDL:41.9 (11/27/2007), 52.4 (03/07/2007)  LDL:132 (11/27/2007), 123 (03/07/2007)  Chol:192 (11/27/2007), 198 (03/07/2007)  Trig:91 (11/27/2007), 115 (03/07/2007) goal ldl < 70 - Pt to continue diet efforts, ; to check labs - goal LDL less than 70   Problem # 6:  EDEMA (ICD-782.3)  ankle edema LLE only - diff includes venous insuff, fibroid sitting on the vein system, anemia worsening or other;  for hctz 12.5  Her updated medication list for this  problem includes:    Hydrochlorothiazide 12.5 Mg Caps (Hydrochlorothiazide) .Marland Kitchen... 1 by mouth once daily  Complete Medication List: 1)  Alprazolam 0.25 Mg Tabs (Alprazolam) .... Take 1 tablet by mouth three times a day 2)  Metformin Hcl 500 Mg Tabs (Metformin hcl) .Marland Kitchen.. 1po once daily 3)  Adult Aspirin Ec Low Strength 81 Mg Tbec (Aspirin) .Marland Kitchen.. 1 by mouth qd 4)  Accu-chek Aviva Strp (Glucose blood) .... Use daily to check glucose 5)  Proventil Hfa 108 (90 Base) Mcg/act Aers (Albuterol sulfate) .... Inhale 2 puffs as directed four times every day as needed 6)  Tylenol Extra Strength 500 Mg Tabs (Acetaminophen) .... As needed 7)  Motrin Ib 200 Mg Tabs (Ibuprofen) .... As needed 8)  Reyataz 300 Mg Caps (Atazanavir sulfate) .Marland Kitchen.. 1 cap daily 9)  Vitamin D3 1000 Unit Tabs (Cholecalciferol) .Marland Kitchen.. 1 by mouth once daily 10)  Hydrochlorothiazide 12.5 Mg Caps (Hydrochlorothiazide) .Marland Kitchen.. 1 by mouth once daily  Patient Instructions: 1)  please start vit D OTC 1000 units per day 2)  Please go to the Lab in the basement for your blood and/or urine tests today 3)  please follow up with your GYN for the procedure 4)  Continue all previous medications as before this visit  5)  Please schedule a follow-up appointment in 6 months with : 6)  BMP prior to visit, ICD-9: 250.02 7)  Lipid Panel prior to visit, ICD-9: 8)  HbgA1C prior to visit, ICD-9: Prescriptions: HYDROCHLOROTHIAZIDE 12.5 MG CAPS (HYDROCHLOROTHIAZIDE) 1 by mouth once daily  #30 x 11   Entered and Authorized by:   Corwin Levins MD   Signed by:   Corwin Levins MD on 01/30/2010   Method used:   Print then Give to Patient   RxID:   [id number]

## 2010-08-27 NOTE — Letter (Signed)
Summary: [facility]   Imported By: Sherian Rein 07/31/2010 10:13:39  _____________________________________________________________________  External Attachment:    Type:   Image     Comment:   External Document

## 2010-11-05 ENCOUNTER — Encounter: Payer: Self-pay | Admitting: Internal Medicine

## 2010-12-11 NOTE — Op Note (Signed)
[facility]  Patient:    Kelly Rowe, DOCTER Visit Number: [id number] MRN: [id number]          Service Type: Attending:  Harvie Junior, M.D. Dictated by:   Harvie Junior, M.D. Proc. Date: 07/05/01                             Operative Report  PREOPERATIVE DIAGNOSIS:  Carpal tunnel syndrome bilateral.  POSTOPERATIVE DIAGNOSIS:  Carpal tunnel syndrome bilateral.  OPERATION PERFORMED:  Left carpal tunnel release.  SURGEON:  Harvie Junior, M.D.  ASSISTANT:  Currie Paris. Thedore Mins.  ANESTHESIA:  General.  INDICATIONS FOR PROCEDURE:  The patient is a 40 year old female with a long history of having numbness and tingling in her thumb, index and long finger that has increased with Phalens maneuver and she had a positive Tinels.  She was treated conservatively with anti-inflammatory medication, wrist brace, injection, all have failed and ultimately because of continued complaints of numbness and tingling in the wrist, the patient was taken to the operating room for carpal tunnel release.  DESCRIPTION OF PROCEDURE:  The patient was taken to the operating room and after adequate anesthesia was obtained with general anesthetic, the patient was placed supine on the operating table and the left arm was prepped and draped in the usual sterile fashion.  Following Esmarch exsanguination of the extremity, a blood pressure tourniquet was inflated to 250 mmHg.  Following this, a midline incision was made just ulnar to the midline wrist crease. Subcutaneous were then dissected down to the level of the volar carpal ligament which was carefully identified and then divided longitudinally until there was a small rent in the ligament.  A Freer elevator was then used to make sure that the nerve was not adherent to the ligament on the undersurface. A curved incision was then carefully used to divide the ligament both proximally and distally.  A gloved finger could  be put in the wound proximally and distally.  The nerve was completely freed up.  The nerve was then identified and noted to have no significant abnormality intrinsic, the motor branch of the median nerve was identified.  At this point the wound was copiously irrigated and suctioned dry.  The skin was closed with a combination of interrupted and running sutures.  A sterile and compressive dressing was applied and the volar plaster.  The patient was transferred to the recovery room where she was noted to be in satisfactory condition.  Estimated blood loss for this procedure was none. Dictated by:   Harvie Junior, M.D. Attending:  Harvie Junior, M.D. DD:  07/05/01 TD:  07/05/01 Job: [id number]

## 2011-02-12 ENCOUNTER — Other Ambulatory Visit: Payer: Self-pay | Admitting: Gynecology

## 2011-02-12 DIAGNOSIS — Z1231 Encounter for screening mammogram for malignant neoplasm of breast: Secondary | ICD-10-CM

## 2011-02-14 ENCOUNTER — Encounter: Payer: Self-pay | Admitting: Internal Medicine

## 2011-02-14 DIAGNOSIS — Z0000 Encounter for general adult medical examination without abnormal findings: Secondary | ICD-10-CM | POA: Insufficient documentation

## 2011-02-14 DIAGNOSIS — Z0001 Encounter for general adult medical examination with abnormal findings: Secondary | ICD-10-CM | POA: Insufficient documentation

## 2011-02-16 ENCOUNTER — Encounter: Payer: Self-pay | Admitting: Internal Medicine

## 2011-02-17 ENCOUNTER — Other Ambulatory Visit (INDEPENDENT_AMBULATORY_CARE_PROVIDER_SITE_OTHER): Payer: Self-pay

## 2011-02-17 DIAGNOSIS — Z0000 Encounter for general adult medical examination without abnormal findings: Secondary | ICD-10-CM

## 2011-02-17 LAB — BASIC METABOLIC PANEL
BUN: 9 mg/dL (ref 6–23)
CO2: 29 mEq/L (ref 19–32)
Calcium: 9.1 mg/dL (ref 8.4–10.5)
Chloride: 98 mEq/L (ref 96–112)
Creatinine, Ser: 0.6 mg/dL (ref 0.4–1.2)
GFR: 143.65 mL/min (ref >60.00)
Glucose, Bld: 97 mg/dL (ref 70–99)
Potassium: 4.1 mEq/L (ref 3.5–5.1)
Sodium: 137 mEq/L (ref 135–145)

## 2011-02-17 LAB — CBC WITH DIFFERENTIAL/PLATELET
Basophils Absolute: 0 10*3/uL (ref 0.0–0.1)
Basophils Relative: 0.5 % (ref 0.0–3.0)
Eosinophils Absolute: 0 10*3/uL (ref 0.0–0.7)
Eosinophils Relative: 0.3 % (ref 0.0–5.0)
HCT: 37 % (ref 36.0–46.0)
Hemoglobin: 12.1 g/dL (ref 12.0–15.0)
Lymphocytes Relative: 47.6 % — ABNORMAL HIGH (ref 12.0–46.0)
Lymphs Abs: 1.5 10*3/uL (ref 0.7–4.0)
MCHC: 32.6 g/dL (ref 30.0–36.0)
MCV: 77.7 fl — ABNORMAL LOW (ref 78.0–100.0)
Monocytes Absolute: 0.3 10*3/uL (ref 0.1–1.0)
Monocytes Relative: 7.9 % (ref 3.0–12.0)
Neutro Abs: 1.4 10*3/uL (ref 1.4–7.7)
Neutrophils Relative %: 43.7 % (ref 43.0–77.0)
Platelets: 302 10*3/uL (ref 150.0–400.0)
RBC: 4.76 Mil/uL (ref 3.87–5.11)
RDW: 17.5 % — ABNORMAL HIGH (ref 11.5–14.6)
WBC: 3.2 10*3/uL — ABNORMAL LOW (ref 4.5–10.5)

## 2011-02-17 LAB — URINALYSIS
Bilirubin Urine: NEGATIVE
Hgb urine dipstick: NEGATIVE
Ketones, ur: NEGATIVE
Leukocytes, UA: NEGATIVE
Nitrite: NEGATIVE
Specific Gravity, Urine: 1.025 (ref 1.000–1.030)
Total Protein, Urine: NEGATIVE
Urine Glucose: NEGATIVE
Urobilinogen, UA: 0.2 (ref 0.0–1.0)
pH: 5.5 (ref 5.0–8.0)

## 2011-02-17 LAB — LIPID PANEL
Cholesterol: 198 mg/dL (ref 0–200)
HDL: 48.9 mg/dL (ref >39.00)
LDL Cholesterol: 133 mg/dL — ABNORMAL HIGH (ref 0–99)
Total CHOL/HDL Ratio: 4
Triglycerides: 81 mg/dL (ref 0.0–149.0)
VLDL: 16.2 mg/dL (ref 0.0–40.0)

## 2011-02-17 LAB — HEPATIC FUNCTION PANEL
ALT: 17 U/L (ref 0–35)
AST: 21 U/L (ref 0–37)
Albumin: 4 g/dL (ref 3.5–5.2)
Alkaline Phosphatase: 148 U/L — ABNORMAL HIGH (ref 39–117)
Bilirubin, Direct: 0.2 mg/dL (ref 0.0–0.3)
Total Bilirubin: 1.9 mg/dL — ABNORMAL HIGH (ref 0.3–1.2)
Total Protein: 8.5 g/dL — ABNORMAL HIGH (ref 6.0–8.3)

## 2011-02-17 LAB — TSH: TSH: 1.9 u[IU]/mL (ref 0.35–5.50)

## 2011-02-22 ENCOUNTER — Encounter: Payer: Self-pay | Admitting: Internal Medicine

## 2011-02-24 ENCOUNTER — Ambulatory Visit (HOSPITAL_COMMUNITY)
Admission: RE | Admit: 2011-02-24 | Discharge: 2011-02-24 | Disposition: A | Discharge status: Home / Self Care | Payer: BC Managed Care – PPO | Source: Ambulatory Visit | Attending: Gynecology | Admitting: Gynecology

## 2011-02-24 DIAGNOSIS — Z1231 Encounter for screening mammogram for malignant neoplasm of breast: Secondary | ICD-10-CM

## 2011-02-25 ENCOUNTER — Ambulatory Visit (INDEPENDENT_AMBULATORY_CARE_PROVIDER_SITE_OTHER)
Admission: RE | Admit: 2011-02-25 | Discharge: 2011-02-25 | Disposition: A | Discharge status: Home / Self Care | Payer: BC Managed Care – PPO | Source: Ambulatory Visit | Attending: Internal Medicine | Admitting: Internal Medicine

## 2011-02-25 ENCOUNTER — Ambulatory Visit (INDEPENDENT_AMBULATORY_CARE_PROVIDER_SITE_OTHER): Payer: BC Managed Care – PPO | Admitting: Internal Medicine

## 2011-02-25 ENCOUNTER — Encounter: Payer: Self-pay | Admitting: Internal Medicine

## 2011-02-25 VITALS — BP 120/90 | HR 75 | Temp 98.3°F | Ht 63.0 in | Wt 256.4 lb

## 2011-02-25 DIAGNOSIS — E785 Hyperlipidemia, unspecified: Secondary | ICD-10-CM

## 2011-02-25 DIAGNOSIS — E119 Type 2 diabetes mellitus without complications: Secondary | ICD-10-CM

## 2011-02-25 DIAGNOSIS — Z0000 Encounter for general adult medical examination without abnormal findings: Secondary | ICD-10-CM

## 2011-02-25 DIAGNOSIS — R05 Cough: Secondary | ICD-10-CM

## 2011-02-25 DIAGNOSIS — R059 Cough, unspecified: Secondary | ICD-10-CM

## 2011-02-25 DIAGNOSIS — J45909 Unspecified asthma, uncomplicated: Secondary | ICD-10-CM | POA: Insufficient documentation

## 2011-02-25 DIAGNOSIS — Z79899 Other long term (current) drug therapy: Secondary | ICD-10-CM

## 2011-02-25 MED ORDER — ONETOUCH ULTRASOFT LANCETS MISC: Status: AC

## 2011-02-25 MED ORDER — ATORVASTATIN CALCIUM 10 MG PO TABS: 10.0000 mg | ORAL_TABLET | Freq: Every day | ORAL | Status: DC

## 2011-02-25 MED ORDER — FLUTICASONE-SALMETEROL 250-50 MCG/DOSE IN AEPB: 1.0000 | INHALATION_SPRAY | Freq: Two times a day (BID) | RESPIRATORY_TRACT | Status: DC

## 2011-02-25 MED ORDER — ALBUTEROL SULFATE HFA 108 (90 BASE) MCG/ACT IN AERS: 2.0000 | INHALATION_SPRAY | Freq: Four times a day (QID) | RESPIRATORY_TRACT | Status: DC | PRN

## 2011-02-25 MED ORDER — GLUCOSE BLOOD VI STRP: ORAL_STRIP | Status: AC

## 2011-02-25 NOTE — Assessment & Plan Note (Signed)
Likely related to asthma flare recent - for CXR, and re-start advair she did well with in the past

## 2011-02-25 NOTE — Assessment & Plan Note (Signed)
Mild persistent by hx;  For advair

## 2011-02-25 NOTE — Progress Notes (Signed)
Subjective:    Patient ID: Kelly Rowe, female    DOB: March 26, 1971, 40 y.o.   MRN: [id number]  HPI  Here for wellness and f/u;  Overall doing ok;  Pt denies  worsening SOB, DOE, wheezing, orthopnea, PND, worsening LE edema, palpitations, dizziness or syncope, except for 2 mo recent nonprod cough and mild wheeze/sob/doe intermittent.  Pt denies neurological change such as new Headache, facial or extremity weakness.  Pt denies polydipsia, polyuria, or low sugar symptoms. Pt states overall good compliance with treatment and medications, good tolerability, and trying to follow lower cholesterol diet.  Pt denies worsening depressive symptoms, suicidal ideation or panic. No fever, wt loss, night sweats, loss of appetite, or other constitutional symptoms.  Pt states good ability with ADL's, low fall risk, home safety reviewed and adequate, no significant changes in hearing or vision, and occasionally active with exercise.    Past Medical History  Diagnosis Date  . ANEMIA-IRON DEFICIENCY 01/30/2010  . ANXIETY 11/27/2007  . Carbuncle and furuncle of trunk 04/16/2010  . DIABETES MELLITUS, TYPE II 08/02/2007  . Edema 01/30/2010  . ELEVATED BLOOD PRESSURE WITHOUT DIAGNOSIS OF HYPERTENSION 08/02/2007  . FREQUENCY, URINARY 05/19/2009  . GENITAL HERPES 03/12/2009  . HIV INFECTION 03/12/2009  . HYPERLIPIDEMIA 08/02/2007  . Metrorrhagia 03/21/2008  . RETENTION, URINE 05/19/2009  . VITAMIN D DEFICIENCY 01/30/2010  . Chlamydia infection 03/21/2008  . HSV (herpes simplex virus) infection   . Trichomonas infection 01/19/2010  . Asthma 02/25/2011   Past Surgical History  Procedure Date  . Cholecystectomy   . Tubal ligation   . Endometrial ablation 01/11/2008    HER OPTION  . Abdominal hysterectomy 07/22/2010    TAH WITH PRESERVATION OF BOTH TUBES AND OVARIES    reports that she has never smoked. She does not have any smokeless tobacco history on file. She reports that she does not drink alcohol or use illicit  drugs. family history includes Cancer in her father; Diabetes in her father and mother; Heart disease in her other; Hypertension in her mother; Prostate cancer in her other; and Stroke in her other. Allergies  Allergen Reactions  . Penicillins     REACTION: rash   Current Outpatient Prescriptions on File Prior to Visit  Medication Sig Dispense Refill  . acetaminophen (TYLENOL) 500 MG tablet Take 500 mg by mouth as needed.        . ALPRAZolam (XANAX) 0.25 MG tablet Take 0.25 mg by mouth 3 (three) times daily as needed.        Marland Kitchen aspirin 81 MG tablet Take 81 mg by mouth daily.        Marland Kitchen atazanavir (REYATAZ) 300 MG capsule Take 300 mg by mouth daily with breakfast.        . Cholecalciferol (VITAMIN D3) 1000 UNITS CAPS Take by mouth daily.        . hydrochlorothiazide (HYDRODIURIL) 12.5 MG tablet Take 12.5 mg by mouth daily.        Marland Kitchen HYDROcodone-acetaminophen (NORCO) 5-325 MG per tablet Take 1 tablet by mouth every 6 (six) hours as needed.        . metFORMIN (GLUCOPHAGE) 500 MG tablet Take 500 mg by mouth daily with breakfast.         Review of Systems Review of Systems  Constitutional: Negative for diaphoresis, activity change, appetite change and unexpected weight change.  HENT: Negative for hearing loss, ear pain, facial swelling, mouth sores and neck stiffness.   Eyes: Negative for pain, redness and visual  disturbance.  Respiratory: Negative for shortness of breath and wheezing.   Cardiovascular: Negative for chest pain and palpitations.  Gastrointestinal: Negative for diarrhea, blood in stool, abdominal distention and rectal pain.  Genitourinary: Negative for hematuria, flank pain and decreased urine volume.  Musculoskeletal: Negative for myalgias and joint swelling.  Skin: Negative for color change and wound.  Neurological: Negative for syncope and numbness.  Hematological: Negative for adenopathy.  Psychiatric/Behavioral: Negative for hallucinations, self-injury, decreased  concentration and agitation.     Objective:   Physical Exam BP 120/90  Pulse 75  Temp(Src) 98.3 F (36.8 C) (Oral)  Ht 5\' 3"  (1.6 m)  Wt 256 lb 6 oz (116.291 kg)  BMI 45.41 kg/m2  SpO2 97% Physical Exam  VS noted Constitutional: Pt is oriented to person, place, and time. Appears well-developed and well-nourished.  HENT:  Head: Normocephalic and atraumatic.  Right Ear: External ear normal.  Left Ear: External ear normal.  Nose: Nose normal.  Mouth/Throat: Oropharynx is clear and moist.  Eyes: Conjunctivae and EOM are normal. Pupils are equal, round, and reactive to light.  Neck: Normal range of motion. Neck supple. No JVD present. No tracheal deviation present.  Cardiovascular: Normal rate, regular rhythm, normal heart sounds and intact distal pulses.   Pulmonary/Chest: Effort normal and breath sounds mild decreased.  Abdominal: Soft. Bowel sounds are normal. There is no tenderness.  Musculoskeletal: Normal range of motion. Exhibits no edema.  Lymphadenopathy:  Has no cervical adenopathy.  Neurological: Pt is alert and oriented to person, place, and time. Pt has normal reflexes. No cranial nerve deficit.  Skin: Skin is warm and dry. No rash noted.  Psychiatric:  Has  normal mood and affect. Behavior is normal.

## 2011-02-25 NOTE — Assessment & Plan Note (Signed)

## 2011-02-25 NOTE — Patient Instructions (Addendum)
Take all new medications as prescribed - the advair, and the lipitor Please return in 4 wks for LAB only :  Lipids and liver tests  You are given the glucometer today, and rx for strips Your medications were refilled today that your requested Please have the pharmacy call if need further refills Please go to XRAY in the Basement for the x-ray test Please call the phone number [phone] (the [employer] System) for results of testing in 2-3 days;  When calling, simply dial the number, and when prompted enter the MRN number above (the Medical Record Number) and the # key, then the message should start. Please return in 6 mo with Lab testing done 3-5 days before

## 2011-02-28 ENCOUNTER — Encounter: Payer: Self-pay | Admitting: Internal Medicine

## 2011-02-28 NOTE — Assessment & Plan Note (Addendum)
Uncontroleld, To start statin, with f/u labs at 4 wks, to f/u any worsening symptoms or concerns

## 2011-02-28 NOTE — Assessment & Plan Note (Signed)
stable overall by hx and exam, most recent data reviewed with pt, and pt to continue medical treatment as before / Lab Results  Component Value Date   HGBA1C 6.5 01/30/2010   

## 2011-03-03 ENCOUNTER — Encounter: Payer: Self-pay | Admitting: Gynecology

## 2011-03-03 ENCOUNTER — Other Ambulatory Visit (HOSPITAL_COMMUNITY)
Admission: RE | Admit: 2011-03-03 | Discharge: 2011-03-03 | Disposition: A | Discharge status: Home / Self Care | Payer: BC Managed Care – PPO | Source: Ambulatory Visit | Attending: Gynecology | Admitting: Gynecology

## 2011-03-03 ENCOUNTER — Ambulatory Visit (INDEPENDENT_AMBULATORY_CARE_PROVIDER_SITE_OTHER): Payer: BC Managed Care – PPO | Admitting: Gynecology

## 2011-03-03 VITALS — BP 128/90 | Ht 60.25 in | Wt 252.0 lb

## 2011-03-03 DIAGNOSIS — Z01419 Encounter for gynecological examination (general) (routine) without abnormal findings: Secondary | ICD-10-CM

## 2011-03-03 DIAGNOSIS — I10 Essential (primary) hypertension: Secondary | ICD-10-CM

## 2011-03-03 LAB — POTASSIUM: Potassium: 3.7 mEq/L (ref 3.5–5.3)

## 2011-03-03 MED ORDER — IBUPROFEN 800 MG PO TABS: 800.0000 mg | ORAL_TABLET | Freq: Three times a day (TID) | ORAL | Status: AC | PRN

## 2011-03-03 NOTE — Progress Notes (Signed)
Kelly Rowe July 14, 1971 [id number]   History:    40 y.o.  for annual exam street of HIV. 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 She is being followed by Dr. Jonny Ruiz her primary physician who did all her lab work on July 25. Patient with history of type 2 diabetes as well as hypertension. See medication list outlining her medications. She was seen at [facility] in May and her CD counts were normal.  Past medical history,surgical history, family history and social history were all reviewed and documented in the EPIC chart. ROS:  Was performed and pertinent positives and negatives are included in the history.  Exam: chaperone present Filed Vitals:   03/03/11 1606  BP: 128/90   Body mass index is 48.81 kg/(m^2).  General appearance : Well developed well nourished female. Skin grossly normal HEENT: Neck supple, trachea midline Lungs: Clear to auscultation, no rhonchi or wheezes Heart: Regular rate and rhythm, no murmurs or gallops Breast:Examined in sitting and supine position were symmetrical in appearance, no palpable masses, to skin retraction, no nipple inversion, no nipple discharge and no axillary or supraclavicular lymphadenopathy Abdomen: no palpable masses or tenderness Pelvic  Ext/BUS/vagina  normal   Cervix  normal   Uterus  absent,  Adnexa  Without masses or tenderness  Anus and perineum  normal   Rectovaginal  normal sphincter tone without palpated masses or tenderness.   Assessment/Plan:  40 y.o. female for annual exam was asymptomatic with the exception of some thigh cramps that she began experiencing yesterday and today on and off. Since she is on hydrochlorothiazide for her blood pressure will check a potassium level today in the office. Her Pap smear was also done today. She is morbidly obese and these to work on exercising on a regular basis and eating better. Her mammogram is up to date last was in August of 2012 and  she was encouraged to continue to do her monthly self breast examination. We'll see her back in one year or when necessary.    Ok Edwards MD, 4:50 PM 03/03/2011

## 2011-08-26 ENCOUNTER — Ambulatory Visit: Payer: BC Managed Care – PPO | Admitting: Internal Medicine

## 2011-09-03 ENCOUNTER — Ambulatory Visit (INDEPENDENT_AMBULATORY_CARE_PROVIDER_SITE_OTHER): Payer: BC Managed Care – PPO | Admitting: Internal Medicine

## 2011-09-03 ENCOUNTER — Encounter: Payer: Self-pay | Admitting: Internal Medicine

## 2011-09-03 VITALS — BP 128/88 | HR 81 | Temp 99.1°F | Ht 62.0 in | Wt 259.2 lb

## 2011-09-03 DIAGNOSIS — E119 Type 2 diabetes mellitus without complications: Secondary | ICD-10-CM

## 2011-09-03 DIAGNOSIS — Z0000 Encounter for general adult medical examination without abnormal findings: Secondary | ICD-10-CM

## 2011-09-03 DIAGNOSIS — R509 Fever, unspecified: Secondary | ICD-10-CM

## 2011-09-03 DIAGNOSIS — E785 Hyperlipidemia, unspecified: Secondary | ICD-10-CM

## 2011-09-03 MED ORDER — ATORVASTATIN CALCIUM 10 MG PO TABS: 10.0000 mg | ORAL_TABLET | Freq: Every day | ORAL | Status: DC

## 2011-09-03 MED ORDER — METFORMIN HCL 500 MG PO TABS: 500.0000 mg | ORAL_TABLET | Freq: Every day | ORAL | Status: DC

## 2011-09-03 NOTE — Patient Instructions (Signed)
Continue all other medications as before We'll hold off on blood work today Please take your lipitor every day, as well as your other medications as prescribed Please return in 6 mo with Lab testing done 3-5 days before

## 2011-09-03 NOTE — Assessment & Plan Note (Signed)
Low grade, asympt, exam essentially benign, suspect very mild URI,  to f/u any worsening symptoms or concerns

## 2011-09-03 NOTE — Assessment & Plan Note (Signed)
stable overall by hx and exam, most recent data reviewed with pt, and pt to continue medical treatment as before - d/w pt goal ldl at least < 100, encouraged compliance with lipitor, f/u lab next visit  Lab Results  Component Value Date   LDLCALC 133* 02/17/2011

## 2011-09-03 NOTE — Assessment & Plan Note (Signed)
stable overall by hx and exam, most recent data reviewed with pt, and pt to continue medical treatment as before; declines labs today which I think is ok as she has been overall very stable

## 2011-09-03 NOTE — Progress Notes (Signed)
Subjective:    Patient ID: Kelly Rowe, female    DOB: October 11, 1970, 41 y.o.   MRN: [id number]  HPI Here to f/u; overall doing ok,  Pt denies chest pain, increased sob or doe, wheezing, orthopnea, PND, increased LE swelling, palpitations, dizziness or syncope.  Pt denies new neurological symptoms such as new headache, or facial or extremity weakness or numbness   Pt denies polydipsia, polyuria, or low sugar symptoms such as weakness or confusion improved with po intake.  Pt states overall good compliance with meds, trying to follow lower cholesterol, diabetic diet, wt overall stable but little exercise however.  Does feel mild sleepy/tired today, but is unaware of any fever, chills, URI or GU or other acute symtpoms.  Admits to not taking the lipitor asd, often misses several days in a row, but is good about taking other meds Past Medical History  Diagnosis Date  . ANEMIA-IRON DEFICIENCY 01/30/2010  . ANXIETY 11/27/2007  . Carbuncle and furuncle of trunk 04/16/2010  . DIABETES MELLITUS, TYPE II 08/02/2007  . Edema 01/30/2010  . ELEVATED BLOOD PRESSURE WITHOUT DIAGNOSIS OF HYPERTENSION 08/02/2007  . FREQUENCY, URINARY 05/19/2009  . GENITAL HERPES 03/12/2009  . HIV INFECTION 03/12/2009  . HYPERLIPIDEMIA 08/02/2007  . Metrorrhagia 03/21/2008  . RETENTION, URINE 05/19/2009  . VITAMIN D DEFICIENCY 01/30/2010  . Chlamydia infection 03/21/2008  . HSV (herpes simplex virus) infection   . Trichomonas infection 01/19/2010  . Asthma 02/25/2011   Past Surgical History  Procedure Date  . Cholecystectomy   . Tubal ligation   . Endometrial ablation 01/11/2008    HER OPTION  . Abdominal hysterectomy 07/22/2010    TAH WITH PRESERVATION OF BOTH TUBES AND OVARIES    reports that she has never smoked. She has never used smokeless tobacco. She reports that she drinks alcohol. She reports that she does not use illicit drugs. family history includes Cancer in her father; Diabetes in her father and mother; Heart disease  in her other; Hypertension in her mother; Prostate cancer in her other; and Stroke in her other. Allergies  Allergen Reactions  . Penicillins     REACTION: rash   Current Outpatient Prescriptions on File Prior to Visit  Medication Sig Dispense Refill  . acetaminophen (TYLENOL) 500 MG tablet Take 500 mg by mouth as needed.        Marland Kitchen albuterol (PROVENTIL HFA) 108 (90 BASE) MCG/ACT inhaler Inhale 2 puffs into the lungs every 6 (six) hours as needed for wheezing.  3 Inhaler  5  . ALPRAZolam (XANAX) 0.25 MG tablet Take 0.25 mg by mouth 3 (three) times daily as needed.        Marland Kitchen aspirin 81 MG tablet Take 81 mg by mouth daily.        Marland Kitchen atazanavir (REYATAZ) 300 MG capsule Take 300 mg by mouth daily with breakfast.        . Cholecalciferol (VITAMIN D3) 1000 UNITS CAPS Take by mouth daily.        . Fluticasone-Salmeterol (ADVAIR DISKUS) 250-50 MCG/DOSE AEPB Inhale 1 puff into the lungs 2 (two) times daily.  1 each  11  . glucose blood (ONE TOUCH ULTRA TEST) test strip Use as instructed  100 each  12  . hydrochlorothiazide (HYDRODIURIL) 12.5 MG tablet Take 12.5 mg by mouth daily.        Marland Kitchen HYDROcodone-acetaminophen (NORCO) 5-325 MG per tablet Take 1 tablet by mouth every 6 (six) hours as needed.        . Lancets (  ONETOUCH ULTRASOFT) lancets PRN  100 each  5   Review of Systems Review of Systems  Constitutional: Negative for diaphoresis and unexpected weight change.  HENT: Negative for drooling and tinnitus.   Eyes: Negative for photophobia and visual disturbance.  Respiratory: Negative for choking and stridor.   Gastrointestinal: Negative for vomiting and blood in stool.  Genitourinary: Negative for hematuria and decreased urine volume.     Objective:   Physical Exam BP 128/88  Pulse 81  Temp(Src) 99.1 F (37.3 C) (Oral)  Ht 5\' 2"  (1.575 m)  Wt 259 lb 4 oz (117.595 kg)  BMI 47.42 kg/m2  SpO2 97%  LMP 06/21/2010 Physical Exam  VS noted, mild ill appearing Constitutional: Pt appears  well-developed and well-nourished.  HENT: Head: Normocephalic.  Right Ear: External ear normal  Right TM normal.  Left Ear: External ear normal. Left TM mild erythema Sinus nontender.  Pharynx very mild erythema Eyes: Conjunctivae and EOM are normal. Pupils are equal, round, and reactive to light.  Neck: Normal range of motion. Neck supple.  Cardiovascular: Normal rate and regular rhythm.   Pulmonary/Chest: Effort normal and breath sounds normal.  Abd:  Soft, NT, non-distended, + BS Neurological: Pt is alert. No cranial nerve deficit.  Skin: Skin is warm. No erythema.  Psychiatric: Pt behavior is normal. Thought content normal.     Assessment & Plan:

## 2011-11-01 ENCOUNTER — Encounter: Payer: Self-pay | Admitting: Internal Medicine

## 2011-11-01 ENCOUNTER — Ambulatory Visit (INDEPENDENT_AMBULATORY_CARE_PROVIDER_SITE_OTHER): Payer: BC Managed Care – PPO | Admitting: Internal Medicine

## 2011-11-01 ENCOUNTER — Ambulatory Visit (INDEPENDENT_AMBULATORY_CARE_PROVIDER_SITE_OTHER)
Admission: RE | Admit: 2011-11-01 | Discharge: 2011-11-01 | Disposition: A | Discharge status: Home / Self Care | Payer: BC Managed Care – PPO | Source: Ambulatory Visit | Attending: Internal Medicine | Admitting: Internal Medicine

## 2011-11-01 VITALS — BP 138/80 | HR 82 | Temp 98.4°F | Resp 16 | Wt 258.1 lb

## 2011-11-01 DIAGNOSIS — R059 Cough, unspecified: Secondary | ICD-10-CM

## 2011-11-01 DIAGNOSIS — R05 Cough: Secondary | ICD-10-CM

## 2011-11-01 DIAGNOSIS — J209 Acute bronchitis, unspecified: Secondary | ICD-10-CM

## 2011-11-01 MED ORDER — HYDROCOD POLST-CPM POLST ER 10-8 MG PO CP12: 1.0000 | ORAL_CAPSULE | Freq: Two times a day (BID) | ORAL | Status: DC | PRN

## 2011-11-01 MED ORDER — MOXIFLOXACIN HCL 400 MG PO TABS: 400.0000 mg | ORAL_TABLET | Freq: Every day | ORAL | Status: AC

## 2011-11-01 NOTE — Patient Instructions (Signed)

## 2011-11-01 NOTE — Assessment & Plan Note (Signed)
Start avelox for the infection and a cough suppressant as well

## 2011-11-01 NOTE — Progress Notes (Signed)
Subjective:    Patient ID: Kelly Rowe, female    DOB: [birth year], 41 y.o.   MRN: [id number]  Cough This is a new problem. The current episode started in the past 7 days. The problem has been gradually worsening. The problem occurs every few hours. The cough is productive of purulent sputum. Associated symptoms include chills, postnasal drip, rhinorrhea, a sore throat and sweats. Pertinent negatives include no chest pain, ear congestion, ear pain, fever, headaches, heartburn, hemoptysis, myalgias, rash, shortness of breath, weight loss or wheezing. The symptoms are aggravated by nothing. She has tried OTC cough suppressant for the symptoms. The treatment provided mild relief. Her past medical history is significant for asthma.      Review of Systems  Constitutional: Positive for chills. Negative for fever, weight loss, diaphoresis, activity change, appetite change, fatigue and unexpected weight change.  HENT: Positive for sore throat, rhinorrhea, sneezing, postnasal drip and sinus pressure. Negative for hearing loss, ear pain, nosebleeds, congestion, facial swelling, drooling, mouth sores, trouble swallowing, neck pain, neck stiffness, dental problem, voice change, tinnitus and ear discharge.   Eyes: Negative.   Respiratory: Positive for cough. Negative for hemoptysis, choking, chest tightness, shortness of breath, wheezing and stridor.   Cardiovascular: Negative for chest pain, palpitations and leg swelling.  Gastrointestinal: Negative.  Negative for heartburn.  Genitourinary: Negative.   Musculoskeletal: Negative for myalgias, back pain, joint swelling, arthralgias and gait problem.  Skin: Negative for color change, pallor, rash and wound.  Neurological: Negative for dizziness, tremors, seizures, syncope, facial asymmetry, speech difficulty, weakness, light-headedness, numbness and headaches.  Hematological: Negative for adenopathy. Does not bruise/bleed easily.    Psychiatric/Behavioral: Negative.        Objective:   Physical Exam  Vitals reviewed. Constitutional: She is oriented to person, place, and time. Vital signs are normal. She appears well-developed and well-nourished.  Non-toxic appearance. She does not have a sickly appearance. She does not appear ill. No distress.  HENT:  Head: Normocephalic and atraumatic. No trismus in the jaw.  Right Ear: Hearing, tympanic membrane, external ear and ear canal normal.  Left Ear: Hearing, tympanic membrane, external ear and ear canal normal.  Nose: Mucosal edema and rhinorrhea present. No nose lacerations, sinus tenderness, nasal deformity, septal deviation or nasal septal hematoma. No epistaxis.  No foreign bodies. Right sinus exhibits maxillary sinus tenderness. Right sinus exhibits no frontal sinus tenderness. Left sinus exhibits maxillary sinus tenderness. Left sinus exhibits no frontal sinus tenderness.  Mouth/Throat: Oropharynx is clear and moist and mucous membranes are normal. Mucous membranes are not pale, not dry and not cyanotic. No uvula swelling. No oropharyngeal exudate, posterior oropharyngeal edema, posterior oropharyngeal erythema or tonsillar abscesses.  Eyes: Conjunctivae are normal. Right eye exhibits no discharge. Left eye exhibits no discharge. No scleral icterus.  Neck: Normal range of motion. Neck supple. No JVD present. No tracheal deviation present. No thyromegaly present.  Cardiovascular: Normal rate, regular rhythm, normal heart sounds and intact distal pulses.  Exam reveals no gallop and no friction rub.   No murmur heard. Pulmonary/Chest: Effort normal and breath sounds normal. No stridor. No respiratory distress. She has no wheezes. She has no rales. She exhibits no tenderness.  Abdominal: Soft. Bowel sounds are normal. She exhibits no distension and no mass. There is no tenderness. There is no rebound and no guarding.  Musculoskeletal: Normal range of motion. She exhibits no  edema and no tenderness.  Lymphadenopathy:    She has no cervical adenopathy.  Neurological: She is  oriented to person, place, and time.  Skin: Skin is warm and dry. No rash noted. She is not diaphoretic. No erythema. No pallor.  Psychiatric: She has a normal mood and affect. Her behavior is normal. Judgment and thought content normal.          Assessment & Plan:

## 2011-11-01 NOTE — Assessment & Plan Note (Signed)
I will check her CXR today to look for PNA, mass, edema, etc.

## 2011-11-22 ENCOUNTER — Other Ambulatory Visit: Payer: Self-pay | Admitting: Gynecology

## 2011-11-24 ENCOUNTER — Encounter: Payer: Self-pay | Admitting: Gynecology

## 2011-11-24 ENCOUNTER — Ambulatory Visit (INDEPENDENT_AMBULATORY_CARE_PROVIDER_SITE_OTHER): Payer: BC Managed Care – PPO | Admitting: Gynecology

## 2011-11-24 VITALS — BP 128/88

## 2011-11-24 DIAGNOSIS — Z113 Encounter for screening for infections with a predominantly sexual mode of transmission: Secondary | ICD-10-CM

## 2011-11-24 DIAGNOSIS — N898 Other specified noninflammatory disorders of vagina: Secondary | ICD-10-CM

## 2011-11-24 LAB — WET PREP FOR TRICH, YEAST, CLUE
Trich, Wet Prep: NONE SEEN
WBC, Wet Prep HPF POC: NONE SEEN
Yeast Wet Prep HPF POC: NONE SEEN

## 2011-11-24 MED ORDER — CLINDAMYCIN PHOSPHATE 2 % VA CREA: 1.0000 | TOPICAL_CREAM | Freq: Every day | VAGINAL | Status: AC

## 2011-11-24 MED ORDER — FLUCONAZOLE 100 MG PO TABS: 100.0000 mg | ORAL_TABLET | Freq: Every day | ORAL | Status: AC

## 2011-11-24 NOTE — Progress Notes (Addendum)
Patient presented to the office today complaining of vaginal discharge but mostly vulvar pruritus since last Friday. She tried a vinegar with water douche but it minimally improved her symptoms. She has not been sexually active since January of 2013. Patient with prior history of HIV infection and genital herpes. She stated that last time she had intercourse he did use condoms.  Exam: Bartholin urethra Skene glands: Within normal limits Vagina: Slight clear discharge with some odor Cervix: Absent Bimanual: Not done  Rectal: Not examined  Wet prep tumors to count bacteria moderate amount of clot cells and positive amine  Assessment/plan: Bacterial vaginosis will be treated with Cleocin vaginal cream to apply each bedtime for 5 days. Because of some external pruritus she will be prescribed Diflucan 100 mg by mouth since she recently completed her antibiotic treatment for an upper respiratory tract infection. GC and Chlamydia culture obtained resulting in some this dictation.

## 2011-11-24 NOTE — Patient Instructions (Signed)
Bacterial Vaginosis Bacterial vaginosis (BV) is a vaginal infection where the normal balance of bacteria in the vagina is disrupted. The normal balance is then replaced by an overgrowth of certain bacteria. There are several different kinds of bacteria that can cause BV. BV is the most common vaginal infection in women of childbearing age. CAUSES   The cause of BV is not fully understood. BV develops when there is an increase or imbalance of harmful bacteria.   Some activities or behaviors can upset the normal balance of bacteria in the vagina and put women at increased risk including:   Having a new sex partner or multiple sex partners.   Douching.   Using an intrauterine device (IUD) for contraception.   It is not clear what role sexual activity plays in the development of BV. However, women that have never had sexual intercourse are rarely infected with BV.  Women do not get BV from toilet seats, bedding, swimming pools or from touching objects around them.  SYMPTOMS   Grey vaginal discharge.   A fish-like odor with discharge, especially after sexual intercourse.   Itching or burning of the vagina and vulva.   Burning or pain with urination.   Some women have no signs or symptoms at all.  DIAGNOSIS  Your caregiver must examine the vagina for signs of BV. Your caregiver will perform lab tests and look at the sample of vaginal fluid through a microscope. They will look for bacteria and abnormal cells (clue cells), a pH test higher than 4.5, and a positive amine test all associated with BV.  RISKS AND COMPLICATIONS   Pelvic inflammatory disease (PID).   Infections following gynecology surgery.   Developing HIV.   Developing herpes virus.  TREATMENT  Sometimes BV will clear up without treatment. However, all women with symptoms of BV should be treated to avoid complications, especially if gynecology surgery is planned. Female partners generally do not need to be treated. However,  BV may spread between female sex partners so treatment is helpful in preventing a recurrence of BV.   BV may be treated with antibiotics. The antibiotics come in either pill or vaginal cream forms. Either can be used with nonpregnant or pregnant women, but the recommended dosages differ. These antibiotics are not harmful to the baby.   BV can recur after treatment. If this happens, a second round of antibiotics will often be prescribed.   Treatment is important for pregnant women. If not treated, BV can cause a premature delivery, especially for a pregnant woman who had a premature birth in the past. All pregnant women who have symptoms of BV should be checked and treated.   For chronic reoccurrence of BV, treatment with a type of prescribed gel vaginally twice a week is helpful.  HOME CARE INSTRUCTIONS   Finish all medication as directed by your caregiver.   Do not have sex until treatment is completed.   Tell your sexual partner that you have a vaginal infection. They should see their caregiver and be treated if they have problems, such as a mild rash or itching.   Practice safe sex. Use condoms. Only have 1 sex partner.  PREVENTION  Basic prevention steps can help reduce the risk of upsetting the natural balance of bacteria in the vagina and developing BV:  Do not have sexual intercourse (be abstinent).   Do not douche.   Use all of the medicine prescribed for treatment of BV, even if the signs and symptoms go away.     Tell your sex partner if you have BV. That way, they can be treated, if needed, to prevent reoccurrence.  SEEK MEDICAL CARE IF:   Your symptoms are not improving after 3 days of treatment.   You have increased discharge, pain, or fever.  MAKE SURE YOU:   Understand these instructions.   Will watch your condition.   Will get help right away if you are not doing well or get worse.  FOR MORE INFORMATION  Division of STD Prevention (DSTDP), Centers for Disease  Control and Prevention: www.cdc.gov/std American [employer] Association (ASHA): www.ashastd.org  Document Released: 07/12/2005 Document Revised: 07/01/2011 Document Reviewed: 01/02/2009 [employer] Patient Information 2012 [employer]. 

## 2011-11-25 LAB — GC/CHLAMYDIA PROBE AMP, GENITAL
Chlamydia, DNA Probe: NEGATIVE
GC Probe Amp, Genital: NEGATIVE

## 2012-01-11 ENCOUNTER — Telehealth: Payer: Self-pay | Admitting: Internal Medicine

## 2012-01-11 NOTE — Telephone Encounter (Signed)
Caller: Analiz/Patient; PCP: Oliver Barre; CB#: [phone]; ; ; Call regarding bilateral Leg Pain, L worse than R; Onset 3. Weeks Ago, But Has Worsened; after working all day 01/11/12, had severe pain in the knee.  States her feet hurt and she has to massage them before she walks.  Senies swelling in the legs or new cough/shortness of breath.  Has discomfort when working/walking but better with rest.  Able to do small amounts of activity without pain, but if she walks for an extended period of time.  Per protocol, emergent symptoms denied; advised appt within 24 hours.  No appts available in [place] office for within that time frame; info to office staff per office protocol for further assistance/callback.   info to office for possible workin appt.  MAY REACH PATIENT AT [phone].

## 2012-01-11 NOTE — Telephone Encounter (Signed)
Ok for tomorrow am

## 2012-01-11 NOTE — Telephone Encounter (Signed)
APPT WED AT 10:15.

## 2012-01-12 ENCOUNTER — Ambulatory Visit (INDEPENDENT_AMBULATORY_CARE_PROVIDER_SITE_OTHER): Payer: BC Managed Care – PPO | Admitting: Internal Medicine

## 2012-01-12 ENCOUNTER — Encounter: Payer: Self-pay | Admitting: Internal Medicine

## 2012-01-12 VITALS — BP 110/80 | HR 90 | Temp 99.6°F | Ht 62.0 in | Wt 262.2 lb

## 2012-01-12 DIAGNOSIS — M25562 Pain in left knee: Secondary | ICD-10-CM | POA: Insufficient documentation

## 2012-01-12 DIAGNOSIS — F411 Generalized anxiety disorder: Secondary | ICD-10-CM

## 2012-01-12 DIAGNOSIS — M79609 Pain in unspecified limb: Secondary | ICD-10-CM

## 2012-01-12 DIAGNOSIS — M79673 Pain in unspecified foot: Secondary | ICD-10-CM | POA: Insufficient documentation

## 2012-01-12 DIAGNOSIS — E119 Type 2 diabetes mellitus without complications: Secondary | ICD-10-CM

## 2012-01-12 DIAGNOSIS — M25569 Pain in unspecified knee: Secondary | ICD-10-CM

## 2012-01-12 MED ORDER — NABUMETONE 500 MG PO TABS: 500.0000 mg | ORAL_TABLET | Freq: Two times a day (BID) | ORAL | Status: DC

## 2012-01-12 NOTE — Assessment & Plan Note (Signed)
New onset with effusion, pain, tendency for giveaway - ? DJD vs other - for nsaid prn, and refer ortho

## 2012-01-12 NOTE — Assessment & Plan Note (Signed)
bilat c/w plantar fasciitis   - for podiatry referral, nsaid prn

## 2012-01-12 NOTE — Patient Instructions (Addendum)
Take all new medications as prescribed Continue all other medications as before You will be contacted regarding the referral for: orthopedic for the left knee, and podiatry for the heels

## 2012-01-16 ENCOUNTER — Encounter: Payer: Self-pay | Admitting: Internal Medicine

## 2012-01-16 NOTE — Assessment & Plan Note (Signed)
stable overall by hx and exam, most recent data reviewed with pt, and pt to continue medical treatment as before / Lab Results  Component Value Date   HGBA1C 6.5 01/30/2010   

## 2012-01-16 NOTE — Assessment & Plan Note (Signed)
stable overall by hx and exam, most recent data reviewed with pt, and pt to continue medical treatment as before Lab Results  Component Value Date   WBC 3.2* 02/17/2011   HGB 12.1 02/17/2011   HCT 37.0 02/17/2011   PLT 302.0 02/17/2011   GLUCOSE 97 02/17/2011   CHOL 198 02/17/2011   TRIG 81.0 02/17/2011   HDL 48.90 02/17/2011   LDLDIRECT 132.7 01/23/2007   LDLCALC 133* 02/17/2011   ALT 17 02/17/2011   AST 21 02/17/2011   NA 137 02/17/2011   K 3.7 03/03/2011   CL 98 02/17/2011   CREATININE 0.6 02/17/2011   BUN 9 02/17/2011   CO2 29 02/17/2011   TSH 1.90 02/17/2011   HGBA1C 6.5 01/30/2010   MICROALBUR 0.9 01/30/2010

## 2012-01-16 NOTE — Progress Notes (Signed)
Subjective:    Patient ID: Kelly Rowe, female    DOB: 1970-08-08, 41 y.o.   MRN: [id number]  HPI  Here for f/u with c/o acute onset primarily left knee pain and swelling, mild but persistent in the past wk, no obvious cause such as twist or trauma, no hx of gout, no recent fever or giveaways, falls.  Both knees pop in the past but this very different.  Also incidentally with moderate worsening bilat but left > right heel pain for the last several wks as well, worse to walk especially first few steps, better to rest and sit.  May be worse with more wt gain, hard to lose. Pt denies chest pain, increased sob or doe, wheezing, orthopnea, PND, increased LE swelling, palpitations, dizziness or syncope.  Pt denies new neurological symptoms such as new headache, or facial or extremity weakness or numbness   Pt denies polydipsia, polyuria, Pt states overall good compliance with meds, trying to follow lower cholesterol, diabetic diet, wt overall stable but little exercise however.   Denies worsening depressive symptoms, suicidal ideation, or panic, anxiety overall stable. Past Medical History  Diagnosis Date  . ANEMIA-IRON DEFICIENCY 01/30/2010  . ANXIETY 11/27/2007  . Carbuncle and furuncle of trunk 04/16/2010  . DIABETES MELLITUS, TYPE II 08/02/2007  . Edema 01/30/2010  . ELEVATED BLOOD PRESSURE WITHOUT DIAGNOSIS OF HYPERTENSION 08/02/2007  . FREQUENCY, URINARY 05/19/2009  . GENITAL HERPES 03/12/2009  . HIV INFECTION 03/12/2009  . HYPERLIPIDEMIA 08/02/2007  . Metrorrhagia 03/21/2008  . RETENTION, URINE 05/19/2009  . VITAMIN D DEFICIENCY 01/30/2010  . Chlamydia infection 03/21/2008  . HSV (herpes simplex virus) infection   . Trichomonas infection 01/19/2010  . Asthma 02/25/2011   Past Surgical History  Procedure Date  . Cholecystectomy   . Tubal ligation   . Endometrial ablation 01/11/2008    HER OPTION  . Abdominal hysterectomy 07/22/2010    TAH WITH PRESERVATION OF BOTH TUBES AND OVARIES    reports  that she has never smoked. She has never used smokeless tobacco. She reports that she drinks alcohol. She reports that she does not use illicit drugs. family history includes Cancer in her father; Diabetes in her father and mother; Heart disease in her other; Hypertension in her mother; Prostate cancer in her other; and Stroke in her other and paternal uncle. Allergies  Allergen Reactions  . Penicillins     REACTION: rash   Current Outpatient Prescriptions on File Prior to Visit  Medication Sig Dispense Refill  . acetaminophen (TYLENOL) 500 MG tablet Take 500 mg by mouth as needed.        Marland Kitchen albuterol (PROVENTIL HFA) 108 (90 BASE) MCG/ACT inhaler Inhale 2 puffs into the lungs every 6 (six) hours as needed for wheezing.  3 Inhaler  5  . ALPRAZolam (XANAX) 0.25 MG tablet Take 0.25 mg by mouth 3 (three) times daily as needed.        Marland Kitchen aspirin 81 MG tablet Take 81 mg by mouth daily.        Marland Kitchen atazanavir (REYATAZ) 300 MG capsule Take 300 mg by mouth daily with breakfast.        . atorvastatin (LIPITOR) 10 MG tablet Take 1 tablet (10 mg total) by mouth daily.  90 tablet  3  . Cholecalciferol (VITAMIN D3) 1000 UNITS CAPS Take by mouth daily.        . Fluticasone-Salmeterol (ADVAIR DISKUS) 250-50 MCG/DOSE AEPB Inhale 1 puff into the lungs 2 (two) times daily.  1 each  11  . glucose blood (ONE TOUCH ULTRA TEST) test strip Use as instructed  100 each  12  . hydrochlorothiazide (HYDRODIURIL) 12.5 MG tablet Take 12.5 mg by mouth daily.        . Hydrocod Polst-Chlorphen Polst 10-8 MG CP12 Take 1 tablet by mouth 2 (two) times daily as needed.  25 each  1  . HYDROcodone-acetaminophen (NORCO) 5-325 MG per tablet Take 1 tablet by mouth every 6 (six) hours as needed.        . Lancets (ONETOUCH ULTRASOFT) lancets PRN  100 each  5  . metFORMIN (GLUCOPHAGE) 500 MG tablet Take 1 tablet (500 mg total) by mouth daily with breakfast.  90 tablet  3   Review of Systems Review of Systems  Constitutional: Negative for  diaphoresis and unexpected weight change.  HENT: Negative for drooling and tinnitus.   Eyes: Negative for photophobia and visual disturbance.  Respiratory: Negative for choking and stridor.   Gastrointestinal: Negative for vomiting and blood in stool.  Genitourinary: Negative for hematuria and decreased urine volume.   Skin: Negative for color change and wound.  Neurological: Negative for tremors and numbness. .      Objective:   Physical Exam BP 110/80  Pulse 90  Temp 99.6 F (37.6 C) (Oral)  Ht 5\' 2"  (1.575 m)  Wt 262 lb 4 oz (118.956 kg)  BMI 47.97 kg/m2  SpO2 96%  LMP 06/21/2010 Physical Exam  VS noted Constitutional: Pt appears well-developed and well-nourished.  HENT: Head: Normocephalic.  Right Ear: External ear normal.  Left Ear: External ear normal.  Eyes: Conjunctivae and EOM are normal. Pupils are equal, round, and reactive to light.  Neck: Normal range of motion. Neck supple.  Cardiovascular: Normal rate and regular rhythm.   Pulmonary/Chest: Effort normal and breath sounds normal.  Neurological: Pt is alert..  Skin: Skin is warm. No erythema.  Psychiatric: Pt behavior is normal. Thought content normal.  Left knee with 1+ effusion, decrease ROM, diffused mild tender Bilat plantar heels tender left > right     Assessment & Plan:

## 2012-03-03 ENCOUNTER — Encounter: Payer: BC Managed Care – PPO | Admitting: Internal Medicine

## 2012-03-03 DIAGNOSIS — Z0289 Encounter for other administrative examinations: Secondary | ICD-10-CM

## 2012-04-17 ENCOUNTER — Other Ambulatory Visit: Payer: Self-pay

## 2012-04-17 MED ORDER — GLUCOSE BLOOD VI STRP: ORAL_STRIP | Status: DC

## 2012-05-18 ENCOUNTER — Other Ambulatory Visit: Payer: Self-pay | Admitting: Internal Medicine

## 2012-05-18 DIAGNOSIS — Z1231 Encounter for screening mammogram for malignant neoplasm of breast: Secondary | ICD-10-CM

## 2012-05-25 ENCOUNTER — Encounter: Payer: BC Managed Care – PPO | Admitting: Gynecology

## 2012-05-30 ENCOUNTER — Encounter: Payer: Self-pay | Admitting: Gynecology

## 2012-05-30 ENCOUNTER — Ambulatory Visit (INDEPENDENT_AMBULATORY_CARE_PROVIDER_SITE_OTHER): Payer: BC Managed Care – PPO | Admitting: Gynecology

## 2012-05-30 ENCOUNTER — Other Ambulatory Visit (HOSPITAL_COMMUNITY)
Admission: RE | Admit: 2012-05-30 | Discharge: 2012-05-30 | Disposition: A | Discharge status: Home / Self Care | Payer: BC Managed Care – PPO | Source: Ambulatory Visit | Attending: Gynecology | Admitting: Gynecology

## 2012-05-30 VITALS — BP 124/80 | Ht 61.0 in | Wt 267.0 lb

## 2012-05-30 DIAGNOSIS — Z1151 Encounter for screening for human papillomavirus (HPV): Secondary | ICD-10-CM | POA: Insufficient documentation

## 2012-05-30 DIAGNOSIS — Z01419 Encounter for gynecological examination (general) (routine) without abnormal findings: Secondary | ICD-10-CM

## 2012-05-30 DIAGNOSIS — A599 Trichomoniasis, unspecified: Secondary | ICD-10-CM | POA: Insufficient documentation

## 2012-05-30 DIAGNOSIS — A549 Gonococcal infection, unspecified: Secondary | ICD-10-CM | POA: Insufficient documentation

## 2012-05-30 DIAGNOSIS — A749 Chlamydial infection, unspecified: Secondary | ICD-10-CM | POA: Insufficient documentation

## 2012-05-30 DIAGNOSIS — Z23 Encounter for immunization: Secondary | ICD-10-CM

## 2012-05-30 MED ORDER — FLUCONAZOLE 100 MG PO TABS: ORAL_TABLET | ORAL | Status: DC

## 2012-05-30 NOTE — Addendum Note (Signed)
Addended by: Bertram Savin A on: 05/30/2012 11:54 AM   Modules accepted: Orders

## 2012-05-30 NOTE — Progress Notes (Signed)
Kelly Rowe [birth year] [id number]   History:    41 y.o.  for annual gyn exam who has a history of HIV and is being followed by physician at [facility] where she goes every 3 months and has her counts. 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 Dr. Jonny Ruiz is her primary physician who has been treating her for type 2 diabetes and hypertension and we'll be doing her lab work later this year. Patient's mammogram was in 2012 will be scheduling 1 shortly. She in frequently does her self breast examination. Patient is in the flu vaccine. Patient with past history of the following:  Chlamydia infection 2009 HIV infection 2009 Trichomoniasis 2011 HSV GC 2008  Past medical history,surgical history, family history and social history were all reviewed and documented in the EPIC chart.  Gynecologic History Patient's last menstrual period was 06/21/2010. Contraception: Hysterectomy Last Pap: 2012. Results were: normal Last mammogram: 2012. Results were: normal  Obstetric History OB History    Grav Para Term Preterm Abortions TAB SAB Ect Mult Living   7 5 5  2  2   5      # Outc Date GA Lbr Len/2nd Wgt Sex Del Anes PTL Lv   1 TRM     M SVD  No Yes   2 TRM     F SVD  No Yes   3 TRM     M SVD  No Yes   4 TRM     M SVD  No Yes   5 TRM     M SVD  No Yes   6 SAB            7 SAB                ROS: A ROS was performed and pertinent positives and negatives are included in the history.  GENERAL: No fevers or chills. HEENT: No change in vision, no earache, sore throat or sinus congestion. NECK: No pain or stiffness. CARDIOVASCULAR: No chest pain or pressure. No palpitations. PULMONARY: No shortness of breath, cough or wheeze. GASTROINTESTINAL: No abdominal pain, nausea, vomiting or diarrhea, melena or bright red blood per rectum. GENITOURINARY: No urinary frequency, urgency, hesitancy or dysuria. MUSCULOSKELETAL: No joint or  muscle pain, no back pain, no recent trauma. DERMATOLOGIC: No rash, no itching, no lesions. ENDOCRINE: No polyuria, polydipsia, no heat or cold intolerance. No recent change in weight. HEMATOLOGICAL: No anemia or easy bruising or bleeding. NEUROLOGIC: No headache, seizures, numbness, tingling or weakness. PSYCHIATRIC: No depression, no loss of interest in normal activity or change in sleep pattern.     Exam: chaperone present  BP 124/80  Ht 5\' 1"  (1.549 m)  Wt 267 lb (121.11 kg)  BMI 50.45 kg/m2  LMP 06/21/2010  Body mass index is 50.45 kg/(m^2).  General appearance : Well developed well nourished female. No acute distress HEENT: Neck supple, trachea midline, no carotid bruits, no thyroidmegaly Lungs: Clear to auscultation, no rhonchi or wheezes, or rib retractions  Heart: Regular rate and rhythm, no murmurs or gallops Breast:Examined in sitting and supine position were symmetrical in appearance, no palpable masses or tenderness,  no skin retraction, no nipple inversion, no nipple discharge, no skin discoloration, no axillary or supraclavicular lymphadenopathy Abdomen: no palpable masses or tenderness, no rebound or guarding Extremities: no edema or skin discoloration or tenderness  Pelvic:  Bartholin, Urethra, Skene Glands: Within normal limits  Vagina: No gross lesions or discharge  Cervix: Absent  Uterus absent  Adnexa  Without masses or tenderness  Anus and perineum  normal   Rectovaginal  normal sphincter tone without palpated masses or tenderness             Hemoccult not done     Assessment/Plan:  41 y.o. female for annual exam . Although patient's had a history of total abdominal hysterectomy because of her past history we'll continue to do Pap smears every year regardless of the guidelines. Dr. Oliver Barre we'll be doing her lab work. She was given literature formation on monthly self breast examination. She received her flu vaccine today. She was reminded to  schedule her mammogram which is overdue. Literature information on diet and exercise was also provided.    Ok Edwards MD, 10:27 AM 05/30/2012

## 2012-05-30 NOTE — Patient Instructions (Addendum)
Breast Self-Awareness  Practicing breast self-awareness may pick up problems early, prevent significant medical complications, and possibly save your life. By practicing breast self-awareness, you can become familiar with how your breasts look and feel and if your breasts are changing. This allows you to notice changes early. It can also offer you some reassurance that your breast health is good. One way to learn what is normal for your breasts and whether your breasts are changing is to do a breast self-exam.  If you find a lump or something that was not present in the past, it is best to contact your caregiver right away. Other findings that should be evaluated by your caregiver include nipple discharge, especially if it is bloody; skin changes or reddening; areas where the skin seems to be pulled in (retracted); or new lumps and bumps. Breast pain is seldom associated with cancer (malignancy), but should also be evaluated by a caregiver.  BREAST SELF-EXAM  The best time to examine your breasts is 5 7 days after your menstrual period is over. During menstruation, the breasts are lumpier, and it may be more difficult to pick up changes. If you do not menstruate, have reached menopause, or had your uterus removed (hysterectomy), you should examine your breasts at regular intervals, such as monthly. If you are breastfeeding, examine your breasts after a feeding or after using a breast pump. Breast implants do not decrease the risk for lumps or tumors, so continue to perform breast self-exams as recommended. Talk to your caregiver about how to determine the difference between the implant and breast tissue. Also, talk about the amount of pressure you should use during the exam. Over time, you will become more familiar with the variations of your breasts and more comfortable with the exam. A breast self-exam requires you to remove all your clothes above the waist.    Look at your breasts and nipples. Stand in front of  a mirror in a room with good lighting. With your hands on your hips, push your hands firmly downward. Look for a difference in shape, contour, and size from one breast to the other (asymmetry). Asymmetry includes puckers, dips, or bumps. Also, look for skin changes, such as reddened or scaly areas on the breasts. Look for nipple changes, such as discharge, dimpling, repositioning, or redness.   Carefully feel your breasts. This is best done either in the shower or tub while using soapy water or when flat on your back. Place the arm (on the side of the breast you are examining) above your head. Use the pads (not the fingertips) of your three middle fingers on your opposite hand to feel your breasts. Start in the underarm area and use  inch (2 cm) overlapping circles to feel your breast. Use 3 different levels of pressure (light, medium, and firm pressure) at each circle before moving to the next circle. The light pressure is needed to feel the tissue closest to the skin. The medium pressure will help to feel breast tissue a little deeper, while the firm pressure is needed to feel the tissue close to the ribs. Continue the overlapping circles, moving downward over the breast until you feel your ribs below your breast. Then, move one finger-width towards the center of the body. Continue to use the  inch (2 cm) overlapping circles to feel your breast as you move slowly up toward the collar bone (clavicle) near the base of the neck. Continue the up and down exam using all 3 pressures   the chest. Do this with each breast, carefully feeling for lumps or changes.  Keep a written record with breast changes or normal findings for each breast. By writing this information down, you do not need to depend only on memory for size, tenderness, or location. Write down where you are in your menstrual cycle, if you are still menstruating.  Breast tissue can have some lumps or thick tissue. However,  see your caregiver if you find anything that concerns you.  SEEK MEDICAL CARE IF:  You see a change in shape, contour, or size of your breasts or nipples.   You see skin changes, such as reddened or scaly areas on the breasts or nipples.   You have an unusual discharge from your nipples.   You feel a new lump or unusually thick areas.  Document Released: 07/12/2005 Document Revised: 01/11/2012 Document Reviewed: 10/27/2011 [facility] Patient Information 2013 [place], [place].                                                  Cholesterol Control Diet  Cholesterol levels in your body are determined significantly by your diet. Cholesterol levels may also be related to heart disease. The following material helps to explain this relationship and discusses what you can do to help keep your heart healthy. Not all cholesterol is bad. Low-density lipoprotein (LDL) cholesterol is the "bad" cholesterol. It may cause fatty deposits to build up inside your arteries. High-density lipoprotein (HDL) cholesterol is "good." It helps to remove the "bad" LDL cholesterol from your blood. Cholesterol is a very important risk factor for heart disease. Other risk factors are high blood pressure, smoking, stress, heredity, and weight. The heart muscle gets its supply of blood through the coronary arteries. If your LDL cholesterol is high and your HDL cholesterol is low, you are at risk for having fatty deposits build up in your coronary arteries. This leaves less room through which blood can flow. Without sufficient blood and oxygen, the heart muscle cannot function properly and you may feel chest pains (angina pectoris). When a coronary artery closes up entirely, a part of the heart muscle may die, causing a heart attack (myocardial infarction). CHECKING CHOLESTEROL When your caregiver sends your blood to a lab to be analyzed for cholesterol, a complete lipid (fat) profile may be done. With this test, the total amount of  cholesterol and levels of LDL and HDL are determined. Triglycerides are a type of fat that circulates in the blood and can also be used to determine heart disease risk. The list below describes what the numbers should be: Test: Total Cholesterol.  Less than 200 mg/dl.  Test: LDL "bad cholesterol."  Less than 100 mg/dl.   Less than 70 mg/dl if you are at very high risk of a heart attack or sudden cardiac death.  Test: HDL "good cholesterol."  Greater than 50 mg/dl for women.   Greater than 40 mg/dl for men.  Test: Triglycerides.  Less than 150 mg/dl.  CONTROLLING CHOLESTEROL WITH DIET Although exercise and lifestyle factors are important, your diet is key. That is because certain foods are known to raise cholesterol and others to lower it. The goal is to balance foods for their effect on cholesterol and more importantly, to replace saturated and trans fat with other types of fat, such as monounsaturated fat, polyunsaturated fat, and  omega-3 fatty acids. On average, a person should consume no more than 15 to 17 g of saturated fat daily. Saturated and trans fats are considered "bad" fats, and they will raise LDL cholesterol. Saturated fats are primarily found in animal products such as meats, butter, and cream. However, that does not mean you need to sacrifice all your favorite foods. Today, there are good tasting, low-fat, low-cholesterol substitutes for most of the things you like to eat. Choose low-fat or nonfat alternatives. Choose round or loin cuts of red meat, since these types of cuts are lowest in fat and cholesterol. Chicken (without the skin), fish, veal, and ground [place] breast are excellent choices. Eliminate fatty meats, such as hot dogs and salami. Even shellfish have little or no saturated fat. Have a 3 oz (85 g) portion when you eat lean meat, poultry, or fish. Trans fats are also called "partially hydrogenated oils." They are oils that have been scientifically manipulated so that  they are solid at room temperature resulting in a longer shelf life and improved taste and texture of foods in which they are added. Trans fats are found in stick margarine, some tub margarines, cookies, crackers, and baked goods.  When baking and cooking, oils are an excellent substitute for butter. The monounsaturated oils are especially beneficial since it is believed they lower LDL and raise HDL. The oils you should avoid entirely are saturated tropical oils, such as coconut and palm.  Remember to eat liberally from food groups that are naturally free of saturated and trans fat, including fish, fruit, vegetables, beans, grains (barley, rice, couscous, bulgur wheat), and pasta (without cream sauces).  IDENTIFYING FOODS THAT LOWER CHOLESTEROL  Soluble fiber may lower your cholesterol. This type of fiber is found in fruits such as apples, vegetables such as broccoli, potatoes, and carrots, legumes such as beans, peas, and lentils, and grains such as barley. Foods fortified with plant sterols (phytosterol) may also lower cholesterol. You should eat at least 2 g per day of these foods for a cholesterol lowering effect.  Read package labels to identify low-saturated fats, trans fats free, and low-fat foods at the supermarket. Select cheeses that have only 2 to 3 g saturated fat per ounce. Use a heart-healthy tub margarine that is free of trans fats or partially hydrogenated oil. When buying baked goods (cookies, crackers), avoid partially hydrogenated oils. Breads and muffins should be made from whole grains (whole-wheat or whole oat flour, instead of "flour" or "enriched flour"). Buy non-creamy canned soups with reduced salt and no added fats.  FOOD PREPARATION TECHNIQUES  Never deep-fry. If you must fry, either stir-fry, which uses very little fat, or use non-stick cooking sprays. When possible, broil, bake, or roast meats, and steam vegetables. Instead of dressing vegetables with butter or margarine, use  lemon and herbs, applesauce and cinnamon (for squash and sweet potatoes), nonfat yogurt, salsa, and low-fat dressings for salads.  LOW-SATURATED FAT / LOW-FAT FOOD SUBSTITUTES Meats / Saturated Fat (g)  Avoid: Steak, marbled (3 oz/85 g) / 11 g   Choose: Steak, lean (3 oz/85 g) / 4 g   Avoid: Hamburger (3 oz/85 g) / 7 g   Choose: Hamburger, lean (3 oz/85 g) / 5 g   Avoid: Ham (3 oz/85 g) / 6 g   Choose: Ham, lean cut (3 oz/85 g) / 2.4 g   Avoid: Chicken, with skin, dark meat (3 oz/85 g) / 4 g   Choose: Chicken, skin removed, dark meat (3 oz/85 g) /  2 g   Avoid: Chicken, with skin, light meat (3 oz/85 g) / 2.5 g   Choose: Chicken, skin removed, light meat (3 oz/85 g) / 1 g  Dairy / Saturated Fat (g)  Avoid: Whole milk (1 cup) / 5 g   Choose: Low-fat milk, 2% (1 cup) / 3 g   Choose: Low-fat milk, 1% (1 cup) / 1.5 g   Choose: Skim milk (1 cup) / 0.3 g   Avoid: Hard cheese (1 oz/28 g) / 6 g   Choose: Skim milk cheese (1 oz/28 g) / 2 to 3 g   Avoid: Cottage cheese, 4% fat (1 cup) / 6.5 g   Choose: Low-fat cottage cheese, 1% fat (1 cup) / 1.5 g   Avoid: Ice cream (1 cup) / 9 g   Choose: Sherbet (1 cup) / 2.5 g   Choose: Nonfat frozen yogurt (1 cup) / 0.3 g   Choose: Frozen fruit bar / trace   Avoid: Whipped cream (1 tbs) / 3.5 g   Choose: Nondairy whipped topping (1 tbs) / 1 g  Condiments / Saturated Fat (g)  Avoid: Mayonnaise (1 tbs) / 2 g   Choose: Low-fat mayonnaise (1 tbs) / 1 g   Avoid: Butter (1 tbs) / 7 g   Choose: Extra light margarine (1 tbs) / 1 g   Avoid: Coconut oil (1 tbs) / 11.8 g   Choose: Olive oil (1 tbs) / 1.8 g   Choose: Corn oil (1 tbs) / 1.7 g   Choose: Safflower oil (1 tbs) / 1.2 g   Choose: Sunflower oil (1 tbs) / 1.4 g   Choose: Soybean oil (1 tbs) / 2.4 g   Choose: Canola oil (1 tbs) / 1 g  Document Released: 07/12/2005 Document Revised: 03/24/2011 Document Reviewed: 12/31/2010 [facility] Patient Information 2012 [place],  [place].  Exercise to Lose Weight Exercise and a healthy diet may help you lose weight. Your doctor may suggest specific exercises. EXERCISE IDEAS AND TIPS  Choose low-cost things you enjoy doing, such as walking, bicycling, or exercising to workout videos.   Take stairs instead of the elevator.   Walk during your lunch break.   Park your car further away from work or school.   Go to a gym or an exercise class.   Start with 5 to 10 minutes of exercise each day. Build up to 30 minutes of exercise 4 to 6 days a week.   Wear shoes with good support and comfortable clothes.   Stretch before and after working out.   Work out until you breathe harder and your heart beats faster.   Drink extra water when you exercise.   Do not do so much that you hurt yourself, feel dizzy, or get very short of breath.  Exercises that burn about 150 calories:  Running 1  miles in 15 minutes.   Playing volleyball for 45 to 60 minutes.   Washing and waxing a car for 45 to 60 minutes.   Playing touch football for 45 minutes.   Walking 1  miles in 35 minutes.   Pushing a stroller 1  miles in 30 minutes.   Playing basketball for 30 minutes.   Raking leaves for 30 minutes.   Bicycling 5 miles in 30 minutes.   Walking 2 miles in 30 minutes.   Dancing for 30 minutes.   Shoveling snow for 15 minutes.   Swimming laps for 20 minutes.   Walking up stairs for 15 minutes.  Bicycling 4 miles in 15 minutes.   Gardening for 30 to 45 minutes.   Jumping rope for 15 minutes.   Washing windows or floors for 45 to 60 minutes.  Document Released: 08/14/2010 Document Revised: 03/24/2011 Document Reviewed: 08/14/2010 [facility] Patient Information 2012 [place], [place].

## 2012-06-08 ENCOUNTER — Ambulatory Visit (HOSPITAL_COMMUNITY)
Admission: RE | Admit: 2012-06-08 | Discharge: 2012-06-08 | Disposition: A | Discharge status: Home / Self Care | Payer: BC Managed Care – PPO | Source: Ambulatory Visit | Attending: Internal Medicine | Admitting: Internal Medicine

## 2012-06-08 DIAGNOSIS — Z1231 Encounter for screening mammogram for malignant neoplasm of breast: Secondary | ICD-10-CM

## 2012-07-05 ENCOUNTER — Other Ambulatory Visit: Payer: Self-pay | Admitting: Internal Medicine

## 2012-07-05 ENCOUNTER — Other Ambulatory Visit (INDEPENDENT_AMBULATORY_CARE_PROVIDER_SITE_OTHER): Payer: BC Managed Care – PPO

## 2012-07-05 ENCOUNTER — Ambulatory Visit (INDEPENDENT_AMBULATORY_CARE_PROVIDER_SITE_OTHER): Payer: BC Managed Care – PPO | Admitting: Internal Medicine

## 2012-07-05 ENCOUNTER — Encounter: Payer: Self-pay | Admitting: Internal Medicine

## 2012-07-05 VITALS — BP 120/80 | HR 78 | Temp 99.1°F | Ht 62.0 in | Wt 267.5 lb

## 2012-07-05 DIAGNOSIS — E119 Type 2 diabetes mellitus without complications: Secondary | ICD-10-CM

## 2012-07-05 DIAGNOSIS — E785 Hyperlipidemia, unspecified: Secondary | ICD-10-CM

## 2012-07-05 DIAGNOSIS — M25561 Pain in right knee: Secondary | ICD-10-CM | POA: Insufficient documentation

## 2012-07-05 DIAGNOSIS — Z0000 Encounter for general adult medical examination without abnormal findings: Secondary | ICD-10-CM

## 2012-07-05 DIAGNOSIS — K219 Gastro-esophageal reflux disease without esophagitis: Secondary | ICD-10-CM | POA: Insufficient documentation

## 2012-07-05 DIAGNOSIS — M25569 Pain in unspecified knee: Secondary | ICD-10-CM

## 2012-07-05 DIAGNOSIS — R110 Nausea: Secondary | ICD-10-CM

## 2012-07-05 LAB — HEMOGLOBIN A1C: Hgb A1c MFr Bld: 7.5 % — ABNORMAL HIGH (ref 4.6–6.5)

## 2012-07-05 LAB — BASIC METABOLIC PANEL
BUN: 8 mg/dL (ref 6–23)
CO2: 28 mEq/L (ref 19–32)
Calcium: 8.9 mg/dL (ref 8.4–10.5)
Chloride: 101 mEq/L (ref 96–112)
Creatinine, Ser: 0.6 mg/dL (ref 0.4–1.2)
GFR: 139.97 mL/min (ref >60.00)
Glucose, Bld: 138 mg/dL — ABNORMAL HIGH (ref 70–99)
Potassium: 4.1 mEq/L (ref 3.5–5.1)
Sodium: 137 mEq/L (ref 135–145)

## 2012-07-05 LAB — LIPID PANEL
Cholesterol: 201 mg/dL — ABNORMAL HIGH (ref 0–200)
HDL: 40.9 mg/dL (ref >39.00)
Total CHOL/HDL Ratio: 5
Triglycerides: 120 mg/dL (ref 0.0–149.0)
VLDL: 24 mg/dL (ref 0.0–40.0)

## 2012-07-05 LAB — LDL CHOLESTEROL, DIRECT: Direct LDL: 147.7 mg/dL

## 2012-07-05 MED ORDER — TRAMADOL HCL 50 MG PO TABS: 50.0000 mg | ORAL_TABLET | Freq: Three times a day (TID) | ORAL | Status: DC | PRN

## 2012-07-05 MED ORDER — ONDANSETRON HCL 4 MG PO TABS: 4.0000 mg | ORAL_TABLET | Freq: Three times a day (TID) | ORAL | Status: DC | PRN

## 2012-07-05 MED ORDER — ATORVASTATIN CALCIUM 20 MG PO TABS: 20.0000 mg | ORAL_TABLET | Freq: Every day | ORAL | Status: AC

## 2012-07-05 MED ORDER — METFORMIN HCL 500 MG PO TABS: 500.0000 mg | ORAL_TABLET | Freq: Two times a day (BID) | ORAL | Status: DC

## 2012-07-05 NOTE — Patient Instructions (Addendum)
Take all new medications as prescribed - the pain medication (tramadol), as well as the samples of prilosec 20 mg  - ok to take 2 of these per day to see if helps with the nausea and reflux, as well as the generic zofran for nausea as needed OK to stop the nabumetone (the anti-inflammatory from last visit) If the prilosec helps, you can get OTC prilosec 20 mg, or call for prescription You will be contacted regarding the referral for: MRI for the knee Depending on the results, you may need to go back to orthopedic, such as if there is a cartilage tear Please go to LAB in the Basement for the blood and/or urine tests to be done today You will be contacted by phone if any changes need to be made immediately.  Otherwise, you will receive a letter about your results with an explanation Please remember to sign up for My Chart at your earliest convenience, as this will be important to you in the future with finding out test results. Please keep your appointments with your specialists as you have planned You are given the work note today Please continue your efforts at being more active, low cholesterol diabetic diet, and weight control. Please return in 6 mo with Lab testing done 3-5 days before

## 2012-07-05 NOTE — Assessment & Plan Note (Signed)
stable overall by hx and exam, most recent data reviewed with pt, and pt to continue medical treatment as before / Lab Results  Component Value Date   HGBA1C 6.5 01/30/2010

## 2012-07-05 NOTE — Assessment & Plan Note (Addendum)
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  Has low grade temp today but doubt other such as septic arthritis, gout.  Note:  Total time for pt hx, exam, review of record with pt in the room, determination of diagnoses and plan for further eval and tx is > 40 min, with over 50% spent in coordination and counseling of patient

## 2012-07-05 NOTE — Assessment & Plan Note (Signed)
?   Incidental mild viral illness as per pt, or underlying flare of dyspepsia , exam essentially benign - for trial prilosec 40 qd (gave 1 wk samples), as well as zofran prn,  to f/u any worsening symptoms or concerns

## 2012-07-05 NOTE — Progress Notes (Signed)
Subjective:    Patient ID: Kelly Rowe, female    DOB: [birth year], 41 y.o.   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 Cannot recall any torque injury with walking or similar.  No prior knee surgury.  Had xrays at orthopedic, did not need MRI at time.  Referred for PT and attended several classes but had to stop due to cost.    No fever, trauma, hx of gout or similar.  Left knee now asymptomatic, no other joint issues or pain.  Pt denies chest pain, increased sob or doe, wheezing, orthopnea, PND, increased LE swelling, palpitations, dizziness or syncope.   Pt denies polydipsia, polyuria, or low sugar symptoms such as weakness or confusion improved with po intake.  Pt states overall good compliance with meds, trying to follow lower cholesterol, diabetic diet, wt overall stable but little exercise however.   Due for labs.  Had some tightness c/w achilles tendonitis bilat, improved with tx per podiatry with shoe inserta for both feet.  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  Has had intermittent reflux mild  for several months as well, but no dysphagia, abd pain, bowel change or blood.  Needs work note, unable to drive yest or today Past Medical History  Diagnosis Date  . ANEMIA-IRON DEFICIENCY 01/30/2010  . ANXIETY 11/27/2007  . Carbuncle and furuncle of trunk 04/16/2010  . DIABETES MELLITUS, TYPE II 08/02/2007  . Edema 01/30/2010  . ELEVATED BLOOD PRESSURE WITHOUT DIAGNOSIS OF HYPERTENSION 08/02/2007  . FREQUENCY, URINARY 05/19/2009  . GENITAL HERPES 03/12/2009  . HIV INFECTION 03/12/2009  . HYPERLIPIDEMIA 08/02/2007  . Metrorrhagia 03/21/2008  . RETENTION, URINE 05/19/2009  . VITAMIN D DEFICIENCY 01/30/2010  . Chlamydia infection 03/21/2008  . HSV (herpes simplex virus) infection   . Trichomonas infection 01/19/2010  . Asthma 02/25/2011  . Arthritis     both knees  . HIV (human immunodeficiency virus infection) 2009   Past Surgical History  Procedure Date  . Cholecystectomy   . Tubal ligation   . Endometrial ablation 01/11/2008    HER OPTION  . Abdominal hysterectomy 07/22/2010    TAH WITH PRESERVATION OF BOTH TUBES AND OVARIES    reports that she has never smoked. She has never used smokeless tobacco. She reports that she drinks alcohol. She reports that she does not use illicit drugs. family history includes Breast cancer in her paternal aunt; Breast cancer (age of onset:72) in her maternal uncle; Cancer in her father; Diabetes in her father  and mother; Heart disease in her other; Hypertension in her mother; Prostate cancer in her other; and Stroke in her other and paternal uncle. Allergies  Allergen Reactions  . Penicillins     REACTION: rash   Current Outpatient Prescriptions on File Prior to Visit  Medication Sig Dispense Refill  . acetaminophen (TYLENOL) 500 MG tablet Take 500 mg by mouth as needed.        Marland Kitchen albuterol (PROVENTIL HFA) 108 (90 BASE) MCG/ACT inhaler Inhale 2 puffs into the lungs every 6 (six) hours as needed for wheezing.  3 Inhaler  5  . ALPRAZolam (XANAX) 0.25 MG tablet  Take 0.25 mg by mouth 3 (three) times daily as needed.        Marland Kitchen aspirin 81 MG tablet Take 81 mg by mouth daily.        Marland Kitchen atazanavir (REYATAZ) 300 MG capsule Take 300 mg by mouth daily with breakfast.        . atorvastatin (LIPITOR) 10 MG tablet Take 1 tablet (10 mg total) by mouth daily.  90 tablet  3  . Cholecalciferol (VITAMIN D3) 1000 UNITS CAPS Take by mouth daily.        . ferrous fumarate (HEMOCYTE - 106 MG FE) 325 (106 FE) MG TABS Take 1 tablet by mouth.      . fluconazole (DIFLUCAN) 100 MG tablet Diflucan take one tablet daily as needed #3  3 tablet  1  . Fluticasone-Salmeterol (ADVAIR DISKUS) 250-50 MCG/DOSE AEPB Inhale 1 puff into the lungs 2 (two) times daily.  1 each  11  . glucose blood (ACCU-CHEK SMARTVIEW) test strip Use as directed once daily to check blood sugar.  Diagnosis code 250.00  100 each  11  . hydrochlorothiazide (HYDRODIURIL) 12.5 MG tablet Take 12.5 mg by mouth daily.        . Hydrocod Polst-Chlorphen Polst 10-8 MG CP12 Take 1 tablet by mouth 2 (two) times daily as needed.  25 each  1  . HYDROcodone-acetaminophen (NORCO) 5-325 MG per tablet Take 1 tablet by mouth every 6 (six) hours as needed.        . metFORMIN (GLUCOPHAGE) 500 MG tablet Take 1 tablet (500 mg total) by mouth daily with breakfast.  90 tablet  3  . nabumetone (RELAFEN) 500 MG tablet Take 1 tablet (500 mg total) by mouth 2 (two) times daily.  60 tablet  2   Review of Systems  Constitutional: Negative for diaphoresis and unexpected weight change.  HENT: Negative for tinnitus.   Eyes: Negative for photophobia and visual disturbance.  Respiratory: Negative for choking and stridor.   Gastrointestinal: Negative for vomiting and blood in stool.  Genitourinary: Negative for hematuria and decreased urine volume.  Musculoskeletal: Negative for other acute joint pain and swelling Skin: Negative for color change and wound.  Neurological: Negative for tremors and numbness.  Psychiatric/Behavioral: Negative for  decreased concentration. The patient is not hyperactive.       Objective:   Physical Exam BP 120/80  Pulse 78  Temp 99.1 F (37.3 C) (Oral)  Ht 5\' 2"  (1.575 m)  Wt 267 lb 8 oz (121.337 kg)  BMI 48.93 kg/m2  SpO2 95%  LMP 06/21/2010 Physical Exam  VS noted, non toxic Constitutional: Pt appears well-developed and well-nourished. /morbid obese HENT: Head: Normocephalic.  Right Ear: External ear normal.  Left Ear: External ear normal.  Bilat tm's minor erythema.  Sinus nontender.  Pharynx mild erythema - but overall seems minor Eyes: Conjunctivae and EOM are  normal. Pupils are equal, round, and reactive to light.  Neck: Normal range of motion. Neck supple.  Cardiovascular: Normal rate and regular rhythm.   Pulmonary/Chest: Effort normal and breath sounds normal.  Abd:  Soft, NT, non-distended, + BS Neurological: Pt is alert. Not confused  Skin: Skin is warm. No erythema.  Psychiatric: Pt behavior is normal. Thought content normal. Not overly nervous or depressed Right knee with marked anterolat crepitus and possible click, 1-2+ effusion, lockmans neg Distal RLE with trace edema likely related to the knee effusion it seems, calf nontender    Assessment & Plan:

## 2012-07-05 NOTE — Assessment & Plan Note (Signed)
stable overall by hx and exam, most recent data reviewed with pt, and pt to continue medical treatment as before Lab Results  Component Value Date   LDLCALC 133* 02/17/2011   For better diet, consider statin if not improved this visit, for goal < 70

## 2012-07-06 ENCOUNTER — Encounter: Payer: Self-pay | Admitting: Internal Medicine

## 2012-07-15 ENCOUNTER — Ambulatory Visit
Admission: RE | Admit: 2012-07-15 | Discharge: 2012-07-15 | Disposition: A | Discharge status: Home / Self Care | Payer: BC Managed Care – PPO | Source: Ambulatory Visit | Attending: Internal Medicine | Admitting: Internal Medicine

## 2012-07-15 DIAGNOSIS — M25561 Pain in right knee: Secondary | ICD-10-CM

## 2012-07-17 ENCOUNTER — Telehealth: Payer: Self-pay | Admitting: Internal Medicine

## 2012-07-17 ENCOUNTER — Encounter: Payer: Self-pay | Admitting: Internal Medicine

## 2012-07-17 DIAGNOSIS — M25561 Pain in right knee: Secondary | ICD-10-CM

## 2012-07-17 MED ORDER — HYDROCODONE-ACETAMINOPHEN 5-325 MG PO TABS: 1.0000 | ORAL_TABLET | Freq: Four times a day (QID) | ORAL | Status: DC | PRN

## 2012-07-17 NOTE — Telephone Encounter (Signed)
Message copied by Corwin Levins on Mon Jul 17, 2012 11:47 AM ------      Message from: Scharlene Gloss B      Created: Mon Jul 17, 2012 11:42 AM       Patient informed and wants a referral to [facility] [facility]. Also stated the tramadol is not helping and would like something stronger.

## 2012-07-17 NOTE — Telephone Encounter (Signed)
Done hardcopy to [facility] - hydrocodone (change from tramadol)  Referral done per emr

## 2012-07-17 NOTE — Telephone Encounter (Signed)
Faxed hardcopy to pharmacy. 

## 2012-08-11 ENCOUNTER — Ambulatory Visit: Payer: BC Managed Care – PPO | Admitting: Gynecology

## 2012-08-11 ENCOUNTER — Ambulatory Visit (INDEPENDENT_AMBULATORY_CARE_PROVIDER_SITE_OTHER): Payer: BC Managed Care – PPO

## 2012-08-11 ENCOUNTER — Ambulatory Visit: Payer: BC Managed Care – PPO

## 2012-08-11 ENCOUNTER — Ambulatory Visit (INDEPENDENT_AMBULATORY_CARE_PROVIDER_SITE_OTHER): Payer: BC Managed Care – PPO | Admitting: Gynecology

## 2012-08-11 ENCOUNTER — Encounter: Payer: Self-pay | Admitting: Gynecology

## 2012-08-11 VITALS — BP 126/82

## 2012-08-11 DIAGNOSIS — R350 Frequency of micturition: Secondary | ICD-10-CM

## 2012-08-11 DIAGNOSIS — N760 Acute vaginitis: Secondary | ICD-10-CM

## 2012-08-11 DIAGNOSIS — B9689 Other specified bacterial agents as the cause of diseases classified elsewhere: Secondary | ICD-10-CM

## 2012-08-11 DIAGNOSIS — N390 Urinary tract infection, site not specified: Secondary | ICD-10-CM

## 2012-08-11 DIAGNOSIS — R102 Pelvic and perineal pain: Secondary | ICD-10-CM

## 2012-08-11 DIAGNOSIS — IMO0001 Reserved for inherently not codable concepts without codable children: Secondary | ICD-10-CM

## 2012-08-11 DIAGNOSIS — N949 Unspecified condition associated with female genital organs and menstrual cycle: Secondary | ICD-10-CM

## 2012-08-11 DIAGNOSIS — A499 Bacterial infection, unspecified: Secondary | ICD-10-CM

## 2012-08-11 DIAGNOSIS — R300 Dysuria: Secondary | ICD-10-CM

## 2012-08-11 LAB — WET PREP FOR TRICH, YEAST, CLUE
Trich, Wet Prep: NONE SEEN
Yeast Wet Prep HPF POC: NONE SEEN

## 2012-08-11 LAB — URINALYSIS W MICROSCOPIC + REFLEX CULTURE
Bilirubin Urine: NEGATIVE
Casts: NONE SEEN
Crystals: NONE SEEN
Glucose, UA: NEGATIVE mg/dL
Hgb urine dipstick: NEGATIVE
Ketones, ur: NEGATIVE mg/dL
Nitrite: NEGATIVE
Protein, ur: NEGATIVE mg/dL
RBC / HPF: NONE SEEN RBC/hpf (ref <3)
Specific Gravity, Urine: 1.02 (ref 1.005–1.030)
Urobilinogen, UA: 0.2 mg/dL (ref 0.0–1.0)
pH: 6.5 (ref 5.0–8.0)

## 2012-08-11 MED ORDER — CLINDAMYCIN PHOSPHATE 2 % VA CREA: 1.0000 | TOPICAL_CREAM | Freq: Every day | VAGINAL | Status: DC

## 2012-08-11 MED ORDER — NITROFURANTOIN MONOHYD MACRO 100 MG PO CAPS: 100.0000 mg | ORAL_CAPSULE | Freq: Two times a day (BID) | ORAL | Status: DC

## 2012-08-11 NOTE — Addendum Note (Signed)
Addended by: Bertram Savin A on: 08/11/2012 03:23 PM   Modules accepted: Orders

## 2012-08-11 NOTE — Patient Instructions (Addendum)
Bacterial Vaginosis Bacterial vaginosis (BV) is a vaginal infection where the normal balance of bacteria in the vagina is disrupted. The normal balance is then replaced by an overgrowth of certain bacteria. There are several different kinds of bacteria that can cause BV. BV is the most common vaginal infection in women of childbearing age. CAUSES   The cause of BV is not fully understood. BV develops when there is an increase or imbalance of harmful bacteria.  Some activities or behaviors can upset the normal balance of bacteria in the vagina and put women at increased risk including:  Having a new sex partner or multiple sex partners.  Douching.  Using an intrauterine device (IUD) for contraception.  It is not clear what role sexual activity plays in the development of BV. However, women that have never had sexual intercourse are rarely infected with BV. Women do not get BV from toilet seats, bedding, swimming pools or from touching objects around them.  SYMPTOMS   Grey vaginal discharge.  A fish-like odor with discharge, especially after sexual intercourse.  Itching or burning of the vagina and vulva.  Burning or pain with urination.  Some women have no signs or symptoms at all. DIAGNOSIS  Your caregiver must examine the vagina for signs of BV. Your caregiver will perform lab tests and look at the sample of vaginal fluid through a microscope. They will look for bacteria and abnormal cells (clue cells), a pH test higher than 4.5, and a positive amine test all associated with BV.  RISKS AND COMPLICATIONS   Pelvic inflammatory disease (PID).  Infections following gynecology surgery.  Developing HIV.  Developing herpes virus. TREATMENT  Sometimes BV will clear up without treatment. However, all women with symptoms of BV should be treated to avoid complications, especially if gynecology surgery is planned. Female partners generally do not need to be treated. However, BV may spread  between female sex partners so treatment is helpful in preventing a recurrence of BV.   BV may be treated with antibiotics. The antibiotics come in either pill or vaginal cream forms. Either can be used with nonpregnant or pregnant women, but the recommended dosages differ. These antibiotics are not harmful to the baby.  BV can recur after treatment. If this happens, a second round of antibiotics will often be prescribed.  Treatment is important for pregnant women. If not treated, BV can cause a premature delivery, especially for a pregnant woman who had a premature birth in the past. All pregnant women who have symptoms of BV should be checked and treated.  For chronic reoccurrence of BV, treatment with a type of prescribed gel vaginally twice a week is helpful. HOME CARE INSTRUCTIONS   Finish all medication as directed by your caregiver.  Do not have sex until treatment is completed.  Tell your sexual partner that you have a vaginal infection. They should see their caregiver and be treated if they have problems, such as a mild rash or itching.  Practice safe sex. Use condoms. Only have 1 sex partner. PREVENTION  Basic prevention steps can help reduce the risk of upsetting the natural balance of bacteria in the vagina and developing BV:  Do not have sexual intercourse (be abstinent).  Do not douche.  Use all of the medicine prescribed for treatment of BV, even if the signs and symptoms go away.  Tell your sex partner if you have BV. That way, they can be treated, if needed, to prevent reoccurrence. SEEK MEDICAL CARE IF:     prescribed for treatment of BV, even if the signs and symptoms go away.   Tell your sex partner if you have BV. That way, they can be treated, if needed, to prevent reoccurrence.  SEEK MEDICAL CARE IF:    Your symptoms are not improving after 3 days of treatment.   You have increased discharge, pain, or fever.  MAKE SURE YOU:    Understand these instructions.   Will watch your condition.   Will get help right away if you are not doing well or get worse.  FOR MORE INFORMATION   Division of STD Prevention (DSTDP), Centers for Disease Control and Prevention:  www.cdc.gov/std  American Social Health Association (ASHA): www.ashastd.org   Document Released: 07/12/2005 Document Revised: 10/04/2011 Document Reviewed: 01/02/2009  ExitCare Patient Information 2013 [employer]  Urinary Tract Infection  Urinary tract infections (UTIs) can develop anywhere along your urinary tract. Your urinary tract is your body's drainage system for removing wastes and extra water. Your urinary tract includes two kidneys, two ureters, a bladder, and a urethra. Your kidneys are a pair of bean-shaped organs. Each kidney is about the size of your fist. They are located below your ribs, one on each side of your spine.  CAUSES  Infections are caused by microbes, which are microscopic organisms, including fungi, viruses, and bacteria. These organisms are so small that they can only be seen through a microscope. Bacteria are the microbes that most commonly cause UTIs.  SYMPTOMS   Symptoms of UTIs may vary by age and gender of the patient and by the location of the infection. Symptoms in young women typically include a frequent and intense urge to urinate and a painful, burning feeling in the bladder or urethra during urination. Older women and men are more likely to be tired, shaky, and weak and have muscle aches and abdominal pain. A fever may mean the infection is in your kidneys. Other symptoms of a kidney infection include pain in your back or sides below the ribs, nausea, and vomiting.  DIAGNOSIS  To diagnose a UTI, your caregiver will ask you about your symptoms. Your caregiver also will ask to provide a urine sample. The urine sample will be tested for bacteria and white blood cells. White blood cells are made by your body to help fight infection.  TREATMENT   Typically, UTIs can be treated with medication. Because most UTIs are caused by a bacterial infection, they usually can be treated with the use of antibiotics. The choice of antibiotic and length of treatment depend on your symptoms  and the type of bacteria causing your infection.  HOME CARE INSTRUCTIONS   If you were prescribed antibiotics, take them exactly as your caregiver instructs you. Finish the medication even if you feel better after you have only taken some of the medication.   Drink enough water and fluids to keep your urine clear or pale yellow.   Avoid caffeine, tea, and carbonated beverages. They tend to irritate your bladder.   Empty your bladder often. Avoid holding urine for long periods of time.   Empty your bladder before and after sexual intercourse.   After a bowel movement, women should cleanse from front to back. Use each tissue only once.  SEEK MEDICAL CARE IF:    You have back pain.   You develop a fever.   Your symptoms do not begin to resolve within 3 days.  SEEK IMMEDIATE MEDICAL CARE IF:    You have severe back pain or lower

## 2012-08-11 NOTE — Progress Notes (Signed)
Patient presented to the office today complaining of pelvic pressure frequency urinating in spurts. Some vague low back discomfort. No fever chills nausea or vomiting reported. Patient denied any vaginal discharge she was having some irritation recently and had some Terazol and began to apply vaginally. 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 Dr. Jonny Ruiz is her primary physician.  Exam: Abdomen soft pendulous with low abdominal discomfort but no rebound or guarding Pelvic: Bartholin urethra Skene was within normal limits Vagina: White discharge versus cream that patient recently place. Vaginal cuff intact Bimanual exam difficult due to patient's abdominal girth low abdominal discomfort.  A wet prep: Moderate amount of clear cells and too numerous to count bacteria and white blood cells were seen  Urinalysis: White blood cell 3-6, bacteria few, red blood cell non-seen culture pending at time of this dictation  Ultrasound: Absent uterus. Normal-appearing ovaries. Trace amount of free fluid in the cul-de-sac.  Assessment/plan: Patient will with early urinary tract infection also with evidence of bacterial vaginosis. She'll be placed on Cleocin vaginal cream to apply each bedtime for 5 days and will be given a prescription for Macrobid to take 1 by mouth twice a day for 7 days. As an anti-spasmodic agent I've given her samples of Uribell to take 1 by mouth 4 times a day for 2 days and encouraged to increase her fluid intake. If she develops any fever chills nausea vomiting her back pain or worsening symptoms she should report to the office immediately or after hours to the emergency room.

## 2012-08-13 LAB — URINE CULTURE: Colony Count: 7000

## 2012-08-17 ENCOUNTER — Encounter: Payer: BC Managed Care – PPO | Admitting: Internal Medicine

## 2012-08-21 ENCOUNTER — Other Ambulatory Visit: Payer: Self-pay | Admitting: Internal Medicine

## 2012-08-21 NOTE — Telephone Encounter (Signed)
Done hardcopy to robin  

## 2012-08-21 NOTE — Telephone Encounter (Signed)
Faxed hardcopy to pharmacy. 

## 2012-11-30 ENCOUNTER — Other Ambulatory Visit: Payer: Self-pay | Admitting: Gynecology

## 2012-12-26 ENCOUNTER — Other Ambulatory Visit (INDEPENDENT_AMBULATORY_CARE_PROVIDER_SITE_OTHER): Payer: BC Managed Care – PPO

## 2012-12-26 ENCOUNTER — Encounter: Payer: Self-pay | Admitting: Internal Medicine

## 2012-12-26 ENCOUNTER — Telehealth: Payer: Self-pay | Admitting: Internal Medicine

## 2012-12-26 ENCOUNTER — Ambulatory Visit (INDEPENDENT_AMBULATORY_CARE_PROVIDER_SITE_OTHER): Payer: BC Managed Care – PPO | Admitting: Internal Medicine

## 2012-12-26 VITALS — BP 110/70 | HR 86 | Temp 98.5°F | Ht 62.0 in | Wt 259.2 lb

## 2012-12-26 DIAGNOSIS — Z0000 Encounter for general adult medical examination without abnormal findings: Secondary | ICD-10-CM

## 2012-12-26 DIAGNOSIS — E119 Type 2 diabetes mellitus without complications: Secondary | ICD-10-CM

## 2012-12-26 LAB — URINALYSIS, ROUTINE W REFLEX MICROSCOPIC
Bilirubin Urine: NEGATIVE
Hgb urine dipstick: NEGATIVE
Ketones, ur: NEGATIVE
Leukocytes, UA: NEGATIVE
Nitrite: NEGATIVE
RBC / HPF: NONE SEEN (ref 0–?)
Specific Gravity, Urine: >=1.03 (ref 1.000–1.030)
Total Protein, Urine: NEGATIVE
Urine Glucose: NEGATIVE
Urobilinogen, UA: 0.2 (ref 0.0–1.0)
pH: 6 (ref 5.0–8.0)

## 2012-12-26 LAB — BASIC METABOLIC PANEL
BUN: 9 mg/dL (ref 6–23)
CO2: 28 mEq/L (ref 19–32)
Calcium: 9 mg/dL (ref 8.4–10.5)
Chloride: 103 mEq/L (ref 96–112)
Creatinine, Ser: 0.6 mg/dL (ref 0.4–1.2)
GFR: 148.18 mL/min (ref >60.00)
Glucose, Bld: 126 mg/dL — ABNORMAL HIGH (ref 70–99)
Potassium: 3.8 mEq/L (ref 3.5–5.1)
Sodium: 137 mEq/L (ref 135–145)

## 2012-12-26 LAB — HEPATIC FUNCTION PANEL
ALT: 18 U/L (ref 0–35)
AST: 17 U/L (ref 0–37)
Albumin: 3.6 g/dL (ref 3.5–5.2)
Alkaline Phosphatase: 123 U/L — ABNORMAL HIGH (ref 39–117)
Bilirubin, Direct: 0.2 mg/dL (ref 0.0–0.3)
Total Bilirubin: 1.6 mg/dL — ABNORMAL HIGH (ref 0.3–1.2)
Total Protein: 7.7 g/dL (ref 6.0–8.3)

## 2012-12-26 LAB — CBC WITH DIFFERENTIAL/PLATELET
Basophils Absolute: 0 10*3/uL (ref 0.0–0.1)
Basophils Relative: 0.4 % (ref 0.0–3.0)
Eosinophils Absolute: 0 10*3/uL (ref 0.0–0.7)
Eosinophils Relative: 0.5 % (ref 0.0–5.0)
HCT: 37.7 % (ref 36.0–46.0)
Hemoglobin: 12.5 g/dL (ref 12.0–15.0)
Lymphocytes Relative: 45.2 % (ref 12.0–46.0)
Lymphs Abs: 2 10*3/uL (ref 0.7–4.0)
MCHC: 33.2 g/dL (ref 30.0–36.0)
MCV: 81.7 fl (ref 78.0–100.0)
Monocytes Absolute: 0.3 10*3/uL (ref 0.1–1.0)
Monocytes Relative: 7.8 % (ref 3.0–12.0)
Neutro Abs: 2.1 10*3/uL (ref 1.4–7.7)
Neutrophils Relative %: 46.1 % (ref 43.0–77.0)
Platelets: 301 10*3/uL (ref 150.0–400.0)
RBC: 4.62 Mil/uL (ref 3.87–5.11)
RDW: 15.2 % — ABNORMAL HIGH (ref 11.5–14.6)
WBC: 4.5 10*3/uL (ref 4.5–10.5)

## 2012-12-26 LAB — MICROALBUMIN / CREATININE URINE RATIO
Creatinine,U: 227.8 mg/dL
Microalb Creat Ratio: 1 mg/g (ref 0.0–30.0)
Microalb, Ur: 2.2 mg/dL — ABNORMAL HIGH (ref 0.0–1.9)

## 2012-12-26 LAB — LIPID PANEL
Cholesterol: 191 mg/dL (ref 0–200)
HDL: 41.1 mg/dL (ref >39.00)
LDL Cholesterol: 135 mg/dL — ABNORMAL HIGH (ref 0–99)
Total CHOL/HDL Ratio: 5
Triglycerides: 76 mg/dL (ref 0.0–149.0)
VLDL: 15.2 mg/dL (ref 0.0–40.0)

## 2012-12-26 LAB — HEMOGLOBIN A1C: Hgb A1c MFr Bld: 7.6 % — ABNORMAL HIGH (ref 4.6–6.5)

## 2012-12-26 LAB — TSH: TSH: 2.35 u[IU]/mL (ref 0.35–5.50)

## 2012-12-26 MED ORDER — METFORMIN HCL 500 MG PO TABS: ORAL_TABLET | ORAL | Status: DC

## 2012-12-26 NOTE — Patient Instructions (Signed)
Please continue all other medications as before, and refills have been done if requested. Please have the pharmacy call with any other refills you may need. Please continue your efforts at being more active, low cholesterol diet, and weight control. You are otherwise up to date with prevention measures today. Please keep your appointments with your specialists as you have planned Please go to the LAB in the Basement (turn left off the elevator) for the tests to be done today You will be contacted by phone if any changes need to be made immediately.  Otherwise, you will receive a letter about your results with an explanation  Please remember to sign up for My Chart if you have not done so, as this will be important to you in the future with finding out test results, communicating by private email, and scheduling acute appointments online when needed.  Please return in 6 months, or sooner if needed

## 2012-12-26 NOTE — Telephone Encounter (Signed)
Kelly Rowe, actually she should be increased on the metformin as above (already on the med)

## 2012-12-26 NOTE — Progress Notes (Signed)
Subjective:    Patient ID: Kelly Rowe, female    DOB: 1971-01-20, 42 y.o.   MRN: [id number]  HPI Here for wellness and f/u;  Overall doing ok;  Pt denies CP, worsening SOB, DOE, wheezing, orthopnea, PND, worsening LE edema, palpitations, dizziness or syncope.  Pt denies neurological change such as new headache, facial or extremity weakness.  Pt denies polydipsia, polyuria, or low sugar symptoms. Pt states overall good compliance with treatment and medications, good tolerability, and has been trying to follow lower cholesterol diet.  Pt denies worsening depressive symptoms, suicidal ideation or panic. No fever, night sweats, wt loss, loss of appetite, or other constitutional symptoms.  Pt states good ability with ADL's, has low fall risk, home safety reviewed and adequate, no other significant changes in hearing or vision, and only occasionally active with exercise.  Pt involved in a study in [place], asked per study coordinator not to take the lipitor, so has been holding off. Tolerating metformin ok. Right knee pain resolved.  Has not had to take the hctz prn leg swelling recently. Past Medical History  Diagnosis Date  . ANEMIA-IRON DEFICIENCY 01/30/2010  . ANXIETY 11/27/2007  . Carbuncle and furuncle of trunk 04/16/2010  . DIABETES MELLITUS, TYPE II 08/02/2007  . Edema 01/30/2010  . ELEVATED BLOOD PRESSURE WITHOUT DIAGNOSIS OF HYPERTENSION 08/02/2007  . FREQUENCY, URINARY 05/19/2009  . GENITAL HERPES 03/12/2009  . HIV INFECTION 03/12/2009  . HYPERLIPIDEMIA 08/02/2007  . Metrorrhagia 03/21/2008  . RETENTION, URINE 05/19/2009  . VITAMIN D DEFICIENCY 01/30/2010  . Chlamydia infection 03/21/2008  . HSV (herpes simplex virus) infection   . Trichomonas infection 01/19/2010  . Asthma 02/25/2011  . Arthritis     both knees  . HIV (human immunodeficiency virus infection) 2009   Past Surgical History  Procedure Laterality Date  . Cholecystectomy    . Tubal ligation    . Endometrial ablation  01/11/2008   HER OPTION  . Abdominal hysterectomy  07/22/2010    TAH WITH PRESERVATION OF BOTH TUBES AND OVARIES    reports that she has never smoked. She has never used smokeless tobacco. She reports that  drinks alcohol. She reports that she does not use illicit drugs. family history includes Breast cancer in her paternal aunt; Breast cancer (age of onset: 22) in her maternal uncle; Cancer in her father; Diabetes in her father and mother; Heart disease in her other; Hypertension in her mother; Prostate cancer in her other; and Stroke in her other and paternal uncle. Allergies  Allergen Reactions  . Penicillins     REACTION: rash   Current Outpatient Prescriptions on File Prior to Visit  Medication Sig Dispense Refill  . acetaminophen (TYLENOL) 500 MG tablet Take 500 mg by mouth as needed.        Marland Kitchen albuterol (PROVENTIL HFA) 108 (90 BASE) MCG/ACT inhaler Inhale 2 puffs into the lungs every 6 (six) hours as needed for wheezing.  3 Inhaler  5  . ALPRAZolam (XANAX) 0.25 MG tablet Take 0.25 mg by mouth 3 (three) times daily as needed.        Marland Kitchen aspirin 81 MG tablet Take 81 mg by mouth daily.        Marland Kitchen atazanavir (REYATAZ) 300 MG capsule Take 300 mg by mouth daily with breakfast.        . atorvastatin (LIPITOR) 20 MG tablet Take 1 tablet (20 mg total) by mouth daily.  90 tablet  3  . Cholecalciferol (VITAMIN D3) 1000 UNITS CAPS Take  by mouth daily.        . ferrous fumarate (HEMOCYTE - 106 MG FE) 325 (106 FE) MG TABS Take 1 tablet by mouth.      . Fluticasone-Salmeterol (ADVAIR DISKUS) 250-50 MCG/DOSE AEPB Inhale 1 puff into the lungs 2 (two) times daily.  1 each  11  . glucose blood (ACCU-CHEK SMARTVIEW) test strip Use as directed once daily to check blood sugar.  Diagnosis code 250.00  100 each  11  . hydrochlorothiazide (HYDRODIURIL) 12.5 MG tablet Take 12.5 mg by mouth daily.        . Hydrocod Polst-Chlorphen Polst 10-8 MG CP12 Take 1 tablet by mouth 2 (two) times daily as needed.  25 each  1  .  HYDROcodone-acetaminophen (NORCO/VICODIN) 5-325 MG per tablet TAKE ONE TABLET BY MOUTH EVERY 6 HOURS AS NEEDED  60 tablet  0  . metFORMIN (GLUCOPHAGE) 500 MG tablet Take 1 tablet (500 mg total) by mouth 2 (two) times daily with a meal.  180 tablet  3  . ondansetron (ZOFRAN) 4 MG tablet Take 1 tablet (4 mg total) by mouth every 8 (eight) hours as needed for nausea.  40 tablet  1   No current facility-administered medications on file prior to visit.    Review of Systems Constitutional: Negative for diaphoresis, activity change, appetite change or unexpected weight change.  HENT: Negative for hearing loss, ear pain, facial swelling, mouth sores and neck stiffness.   Eyes: Negative for pain, redness and visual disturbance.  Respiratory: Negative for shortness of breath and wheezing.   Cardiovascular: Negative for chest pain and palpitations.  Gastrointestinal: Negative for diarrhea, blood in stool, abdominal distention or other pain Genitourinary: Negative for hematuria, flank pain or change in urine volume.  Musculoskeletal: Negative for myalgias and joint swelling.  Skin: Negative for color change and wound.  Neurological: Negative for syncope and numbness. other than noted Hematological: Negative for adenopathy.  Psychiatric/Behavioral: Negative for hallucinations, self-injury, decreased concentration and agitation.      Objective:   Physical Exam BP 110/70  Pulse 86  Temp(Src) 98.5 F (36.9 C) (Oral)  Ht 5\' 2"  (1.575 m)  Wt 259 lb 4 oz (117.595 kg)  BMI 47.41 kg/m2  SpO2 98%  LMP 06/21/2010 VS noted,  Constitutional: Pt is oriented to person, place, and time. Appears well-developed and well-nourished. /morbid obese Head: Normocephalic and atraumatic.  Right Ear: External ear normal.  Left Ear: External ear normal.  Nose: Nose normal.  Mouth/Throat: Oropharynx is clear and moist.  Eyes: Conjunctivae and EOM are normal. Pupils are equal, round, and reactive to light.  Neck:  Normal range of motion. Neck supple. No JVD present. No tracheal deviation present.  Cardiovascular: Normal rate, regular rhythm, normal heart sounds and intact distal pulses.   Pulmonary/Chest: Effort normal and breath sounds normal.  Abdominal: Soft. Bowel sounds are normal. There is no tenderness. No HSM  Musculoskeletal: Normal range of motion. Exhibits no edema.  Lymphadenopathy:  Has no cervical adenopathy.  Neurological: Pt is alert and oriented to person, place, and time. Pt has normal reflexes. No cranial nerve deficit.  Skin: Skin is warm and dry. No rash noted.  Psychiatric:  Has  normal mood and affect. Behavior is normal.     Assessment & Plan:

## 2012-12-27 NOTE — Telephone Encounter (Signed)
Patient informed of medication increase.

## 2013-06-27 ENCOUNTER — Ambulatory Visit: Payer: BC Managed Care – PPO | Admitting: Internal Medicine

## 2013-07-23 ENCOUNTER — Other Ambulatory Visit: Payer: Self-pay

## 2013-07-23 DIAGNOSIS — Z1231 Encounter for screening mammogram for malignant neoplasm of breast: Secondary | ICD-10-CM

## 2013-08-15 ENCOUNTER — Ambulatory Visit (INDEPENDENT_AMBULATORY_CARE_PROVIDER_SITE_OTHER): Payer: BC Managed Care – PPO | Admitting: Internal Medicine

## 2013-08-15 ENCOUNTER — Encounter: Payer: Self-pay | Admitting: Internal Medicine

## 2013-08-15 VITALS — BP 112/72 | HR 100 | Temp 99.3°F | Ht 62.0 in | Wt 261.5 lb

## 2013-08-15 DIAGNOSIS — J4520 Mild intermittent asthma, uncomplicated: Secondary | ICD-10-CM | POA: Insufficient documentation

## 2013-08-15 DIAGNOSIS — J45909 Unspecified asthma, uncomplicated: Secondary | ICD-10-CM

## 2013-08-15 DIAGNOSIS — E119 Type 2 diabetes mellitus without complications: Secondary | ICD-10-CM

## 2013-08-15 DIAGNOSIS — J209 Acute bronchitis, unspecified: Secondary | ICD-10-CM

## 2013-08-15 MED ORDER — LEVOFLOXACIN 250 MG PO TABS: 250.0000 mg | ORAL_TABLET | Freq: Every day | ORAL | Status: DC

## 2013-08-15 MED ORDER — HYDROCODONE-HOMATROPINE 5-1.5 MG/5ML PO SYRP: 5.0000 mL | ORAL_SOLUTION | Freq: Four times a day (QID) | ORAL | Status: DC | PRN

## 2013-08-15 NOTE — Patient Instructions (Signed)
Please take all new medication as prescribed Please continue all other medications as before You can also take Delsym OTC for cough, and/or Mucinex D (or it's generic off brand) for congestion, and tylenol as needed for pain.

## 2013-08-15 NOTE — Progress Notes (Signed)
Pre-visit discussion using our clinic review tool. No additional management support is needed unless otherwise documented below in the visit note.  

## 2013-08-17 ENCOUNTER — Encounter: Payer: Self-pay | Admitting: Internal Medicine

## 2013-08-17 ENCOUNTER — Ambulatory Visit (INDEPENDENT_AMBULATORY_CARE_PROVIDER_SITE_OTHER): Payer: BC Managed Care – PPO | Admitting: Internal Medicine

## 2013-08-17 VITALS — BP 112/82 | HR 101 | Temp 99.5°F | Ht 62.0 in | Wt 259.1 lb

## 2013-08-17 DIAGNOSIS — R110 Nausea: Secondary | ICD-10-CM

## 2013-08-17 DIAGNOSIS — J4520 Mild intermittent asthma, uncomplicated: Secondary | ICD-10-CM

## 2013-08-17 DIAGNOSIS — J45909 Unspecified asthma, uncomplicated: Secondary | ICD-10-CM

## 2013-08-17 DIAGNOSIS — J209 Acute bronchitis, unspecified: Secondary | ICD-10-CM

## 2013-08-17 MED ORDER — ONDANSETRON HCL 4 MG/2ML IJ SOLN
4.0000 mg | Freq: Once | INTRAMUSCULAR | Status: AC
  Administered 2013-08-17: 4 mg via INTRAMUSCULAR

## 2013-08-17 MED ORDER — ONDANSETRON HCL 4 MG PO TABS: 4.0000 mg | ORAL_TABLET | Freq: Three times a day (TID) | ORAL | Status: DC | PRN

## 2013-08-17 MED ORDER — AZITHROMYCIN 250 MG PO TABS: ORAL_TABLET | ORAL | Status: DC

## 2013-08-17 NOTE — Addendum Note (Signed)
Addended by: Sharon Seller B on: 08/17/2013 10:09 AM   Modules accepted: Orders

## 2013-08-17 NOTE — Progress Notes (Signed)
Pre-visit discussion using our clinic review tool. No additional management support is needed unless otherwise documented below in the visit note.  

## 2013-08-17 NOTE — Assessment & Plan Note (Signed)
Improved, but likely nausea from the levaquin, will place levaquin on intolerance list, change to zpack, , cont all other tx, hold on CXR, doubt aspiration

## 2013-08-17 NOTE — Progress Notes (Signed)
Subjective:    Patient ID: Kelly Rowe, female    DOB: 06/24/1971, 43 y.o.   MRN: [id number]  HPI  Here with f/u , unfortunately has developed persistnet nausea with current tx, and severeal episodes vomiting yest, none today but could not go to work; Denies worsening reflux, abd pain, dysphagia, other bowel change or blood, except for bilat costal margin soreness with cough and vomiting.   Cough has somewhat improved, and Pt denies chest pain, increased sob or doe, wheezing, orthopnea, PND, increased LE swelling, palpitations, dizziness or syncope.  Asks for time off work note for today as well Past Medical History  Diagnosis Date  . ANEMIA-IRON DEFICIENCY 01/30/2010  . ANXIETY 11/27/2007  . Carbuncle and furuncle of trunk 04/16/2010  . DIABETES MELLITUS, TYPE II 08/02/2007  . Edema 01/30/2010  . ELEVATED BLOOD PRESSURE WITHOUT DIAGNOSIS OF HYPERTENSION 08/02/2007  . FREQUENCY, URINARY 05/19/2009  . GENITAL HERPES 03/12/2009  . HIV INFECTION 03/12/2009  . HYPERLIPIDEMIA 08/02/2007  . Metrorrhagia 03/21/2008  . RETENTION, URINE 05/19/2009  . VITAMIN D DEFICIENCY 01/30/2010  . Chlamydia infection 03/21/2008  . HSV (herpes simplex virus) infection   . Trichomonas infection 01/19/2010  . Asthma 02/25/2011  . Arthritis     both knees  . HIV (human immunodeficiency virus infection) 2009   Past Surgical History  Procedure Laterality Date  . Cholecystectomy    . Tubal ligation    . Endometrial ablation  01/11/2008    HER OPTION  . Abdominal hysterectomy  07/22/2010    TAH WITH PRESERVATION OF BOTH TUBES AND OVARIES    reports that she has never smoked. She has never used smokeless tobacco. She reports that she drinks alcohol. She reports that she does not use illicit drugs. family history includes Breast cancer in her paternal aunt; Breast cancer (age of onset: 1) in her maternal uncle; Cancer in her father; Diabetes in her father and mother; Heart disease in her other; Hypertension in her  mother; Prostate cancer in her other; Stroke in her other and paternal uncle. Allergies  Allergen Reactions  . Penicillins     REACTION: rash   Current Outpatient Prescriptions on File Prior to Visit  Medication Sig Dispense Refill  . acetaminophen (TYLENOL) 500 MG tablet Take 500 mg by mouth as needed.        Marland Kitchen albuterol (PROVENTIL HFA) 108 (90 BASE) MCG/ACT inhaler Inhale 2 puffs into the lungs every 6 (six) hours as needed for wheezing.  3 Inhaler  5  . ALPRAZolam (XANAX) 0.25 MG tablet Take 0.25 mg by mouth 3 (three) times daily as needed.        Marland Kitchen aspirin 81 MG tablet Take 81 mg by mouth daily.        Marland Kitchen atazanavir (REYATAZ) 300 MG capsule Take 300 mg by mouth daily with breakfast.        . Cholecalciferol (VITAMIN D3) 1000 UNITS CAPS Take by mouth daily.        . ferrous fumarate (HEMOCYTE - 106 MG FE) 325 (106 FE) MG TABS Take 1 tablet by mouth.      . Fluticasone-Salmeterol (ADVAIR DISKUS) 250-50 MCG/DOSE AEPB Inhale 1 puff into the lungs 2 (two) times daily.  1 each  11  . glucose blood (ACCU-CHEK SMARTVIEW) test strip Use as directed once daily to check blood sugar.  Diagnosis code 250.00  100 each  11  . hydrochlorothiazide (HYDRODIURIL) 12.5 MG tablet Take 12.5 mg by mouth daily.        Marland Kitchen  Hydrocod Polst-Chlorphen Polst 10-8 MG CP12 Take 1 tablet by mouth 2 (two) times daily as needed.  25 each  1  . HYDROcodone-acetaminophen (NORCO/VICODIN) 5-325 MG per tablet TAKE ONE TABLET BY MOUTH EVERY 6 HOURS AS NEEDED  60 tablet  0  . HYDROcodone-homatropine (HYCODAN) 5-1.5 MG/5ML syrup Take 5 mLs by mouth every 6 (six) hours as needed for cough.  180 mL  0  . levofloxacin (LEVAQUIN) 250 MG tablet Take 1 tablet (250 mg total) by mouth daily.  10 tablet  0  . metFORMIN (GLUCOPHAGE) 500 MG tablet 2 tabs by mouth in the AM, and 1 in the PM with meals  270 tablet  3  . ondansetron (ZOFRAN) 4 MG tablet Take 1 tablet (4 mg total) by mouth every 8 (eight) hours as needed for nausea.  40 tablet  1    No current facility-administered medications on file prior to visit.   Review of Systems  Constitutional: Negative for unexpected weight change, or unusual diaphoresis  HENT: Negative for tinnitus.   Eyes: Negative for photophobia and visual disturbance.  Respiratory: Negative for choking and stridor.   Gastrointestinal: Negative for vomiting and blood in stool.  Genitourinary: Negative for hematuria and decreased urine volume.  Musculoskeletal: Negative for acute joint swelling Skin: Negative for color change and wound.  Neurological: Negative for tremors and numbness other than noted  Psychiatric/Behavioral: Negative for decreased concentration or  hyperactivity.       Objective:   Physical Exam BP 112/82  Pulse 101  Temp(Src) 99.5 F (37.5 C) (Oral)  Ht 5\' 2"  (1.575 m)  Wt 259 lb 2 oz (117.538 kg)  BMI 47.38 kg/m2  SpO2 95%  LMP 06/21/2010 VS noted, mild ill Constitutional: Pt appears well-developed and well-nourished.  HENT: Head: NCAT.  Right Ear: External ear normal.  Left Ear: External ear normal.  Eyes: Conjunctivae and EOM are normal. Pupils are equal, round, and reactive to light.  Neck: Normal range of motion. Neck supple.  Cardiovascular: Normal rate and regular rhythm.   Pulmonary/Chest: Effort normal and breath sounds normal.  Abd:  Soft, NT, non-distended, + BS - benign exam Neurological: Pt is alert. Not confused  Skin: Skin is warm. No erythema.  Psychiatric: Pt behavior is normal. Thought content normal.     Assessment & Plan:

## 2013-08-17 NOTE — Patient Instructions (Signed)
You had the nausea shot today (zofran)  OK to stop the levaquin, and this has been place on your list of medication intolerances as a medication that likely causes nausea/vomiting for you  Please take all new medication as prescribed - the zofran pills for nausea, and then different antibiotic (zpack)  Please continue all other medications as before, including the cough medicine  You are given the updated work note as well

## 2013-08-17 NOTE — Assessment & Plan Note (Signed)
For zofran IM today in office, some po prn for home, work note,  But suspect change of levaquin to zpack likely will resolve symtpoms

## 2013-08-17 NOTE — Assessment & Plan Note (Signed)
stable overall by history and exam, recent data reviewed with pt, and pt to continue medical treatment as before,  to f/u any worsening symptoms or concerns SpO2 Readings from Last 3 Encounters:  08/17/13 95%  08/15/13 98%  12/26/12 98%

## 2013-08-20 NOTE — Progress Notes (Signed)
Subjective:    Patient ID: Kelly Rowe, female    DOB: [birth year], 43 y.o.   MRN: [id number]  HPI  Here with acute onset mild to mod 2-3 days ST, HA, general weakness and malaise, with prod cough greenish sputum, but Pt denies chest pain, increased sob or doe, wheezing, orthopnea, PND, increased LE swelling, palpitations, dizziness or syncope.   Pt denies polydipsia, polyuria, or low sugar symptoms such as weakness or confusion improved with po intake.  Pt states overall good compliance with meds, trying to follow lower cholesterol, diabetic diet Past Medical History  Diagnosis Date  . ANEMIA-IRON DEFICIENCY 01/30/2010  . ANXIETY 11/27/2007  . Carbuncle and furuncle of trunk 04/16/2010  . DIABETES MELLITUS, TYPE II 08/02/2007  . Edema 01/30/2010  . ELEVATED BLOOD PRESSURE WITHOUT DIAGNOSIS OF HYPERTENSION 08/02/2007  . FREQUENCY, URINARY 05/19/2009  . GENITAL HERPES 03/12/2009  . HIV INFECTION 03/12/2009  . HYPERLIPIDEMIA 08/02/2007  . Metrorrhagia 03/21/2008  . RETENTION, URINE 05/19/2009  . VITAMIN D DEFICIENCY 01/30/2010  . Chlamydia infection 03/21/2008  . HSV (herpes simplex virus) infection   . Trichomonas infection 01/19/2010  . Asthma 02/25/2011  . Arthritis     both knees  . HIV (human immunodeficiency virus infection) 2009   Past Surgical History  Procedure Laterality Date  . Cholecystectomy    . Tubal ligation    . Endometrial ablation  01/11/2008    HER OPTION  . Abdominal hysterectomy  07/22/2010    TAH WITH PRESERVATION OF BOTH TUBES AND OVARIES    reports that she has never smoked. She has never used smokeless tobacco. She reports that she drinks alcohol. She reports that she does not use illicit drugs. family history includes Breast cancer in her paternal aunt; Breast cancer (age of onset: 62) in her maternal uncle; Cancer in her father; Diabetes in her father and mother; Heart disease in her other; Hypertension in her mother; Prostate cancer in her other; Stroke in her  other and paternal uncle. Allergies  Allergen Reactions  . Levaquin [Levofloxacin In D5w] Nausea And Vomiting  . Penicillins     REACTION: rash   Current Outpatient Prescriptions on File Prior to Visit  Medication Sig Dispense Refill  . acetaminophen (TYLENOL) 500 MG tablet Take 500 mg by mouth as needed.        Marland Kitchen albuterol (PROVENTIL HFA) 108 (90 BASE) MCG/ACT inhaler Inhale 2 puffs into the lungs every 6 (six) hours as needed for wheezing.  3 Inhaler  5  . ALPRAZolam (XANAX) 0.25 MG tablet Take 0.25 mg by mouth 3 (three) times daily as needed.        Marland Kitchen aspirin 81 MG tablet Take 81 mg by mouth daily.        Marland Kitchen atazanavir (REYATAZ) 300 MG capsule Take 300 mg by mouth daily with breakfast.        . Cholecalciferol (VITAMIN D3) 1000 UNITS CAPS Take by mouth daily.        . ferrous fumarate (HEMOCYTE - 106 MG FE) 325 (106 FE) MG TABS Take 1 tablet by mouth.      . Fluticasone-Salmeterol (ADVAIR DISKUS) 250-50 MCG/DOSE AEPB Inhale 1 puff into the lungs 2 (two) times daily.  1 each  11  . glucose blood (ACCU-CHEK SMARTVIEW) test strip Use as directed once daily to check blood sugar.  Diagnosis code 250.00  100 each  11  . hydrochlorothiazide (HYDRODIURIL) 12.5 MG tablet Take 12.5 mg by mouth daily.        Marland Kitchen  Hydrocod Polst-Chlorphen Polst 10-8 MG CP12 Take 1 tablet by mouth 2 (two) times daily as needed.  25 each  1  . HYDROcodone-acetaminophen (NORCO/VICODIN) 5-325 MG per tablet TAKE ONE TABLET BY MOUTH EVERY 6 HOURS AS NEEDED  60 tablet  0  . metFORMIN (GLUCOPHAGE) 500 MG tablet 2 tabs by mouth in the AM, and 1 in the PM with meals  270 tablet  3   No current facility-administered medications on file prior to visit.    .  Review of Systems  Constitutional: Negative for unexpected weight change, or unusual diaphoresis  HENT: Negative for tinnitus.   Eyes: Negative for photophobia and visual disturbance.  Respiratory: Negative for choking and stridor.   Gastrointestinal: Negative for  vomiting and blood in stool.  Genitourinary: Negative for hematuria and decreased urine volume.  Musculoskeletal: Negative for acute joint swelling Skin: Negative for color change and wound.  Neurological: Negative for tremors and numbness other than noted  Psychiatric/Behavioral: Negative for decreased concentration or  hyperactivity.       Objective:   Physical Exam BP 112/72  Pulse 100  Temp(Src) 99.3 F (37.4 C) (Oral)  Ht 5\' 2"  (1.575 m)  Wt 261 lb 8 oz (118.616 kg)  BMI 47.82 kg/m2  SpO2 98%  LMP 06/21/2010 VS noted, mild ill Constitutional: Pt appears well-developed and well-nourished.  HENT: Head: NCAT.  Right Ear: External ear normal.  Left Ear: External ear normal.  Bilat tm's with mild erythema.  Max sinus areas non tender.  Pharynx with mild erythema, no exudate Eyes: Conjunctivae and EOM are normal. Pupils are equal, round, and reactive to light.  Neck: Normal range of motion. Neck supple.  Cardiovascular: Normal rate and regular rhythm.   Pulmonary/Chest: Effort normal and breath sounds somewhat decreased without frank wheezing or rales.  Neurological: Pt is alert. Not confused  Skin: Skin is warm. No erythema.  Psychiatric: Pt behavior is normal. Thought content normal.         Assessment & Plan:

## 2013-08-20 NOTE — Assessment & Plan Note (Signed)
stable overall by history and exam, recent data reviewed with pt, and pt to continue medical treatment as before,  to f/u any worsening symptoms or concerns Lab Results  Component Value Date   HGBA1C 7.6* 12/26/2012   Pt to call with onset polys or cbg > 200 with illness

## 2013-08-20 NOTE — Assessment & Plan Note (Signed)
Mild to mod, for antibx course,  to f/u any worsening symptoms or concerns 

## 2013-08-20 NOTE — Assessment & Plan Note (Signed)
stable overall by history and exam, recent data reviewed with pt, and pt to continue medical treatment as before,  to f/u any worsening symptoms or concerns SpO2 Readings from Last 3 Encounters:  08/17/13 95%  08/15/13 98%  12/26/12 98%    

## 2013-08-22 ENCOUNTER — Telehealth: Payer: Self-pay | Admitting: *Deleted

## 2013-08-22 NOTE — Telephone Encounter (Signed)
Ok for mucinex otc bid prn

## 2013-08-22 NOTE — Telephone Encounter (Signed)
Patient informed. 

## 2013-08-22 NOTE — Telephone Encounter (Signed)
Patient left vm concerning her ears. Patient stated that she is having an echo now and not pain. Patient wants to know if there is something she can do where she doesn't have to come back in for an ov?

## 2013-08-23 ENCOUNTER — Ambulatory Visit
Admission: RE | Admit: 2013-08-23 | Discharge: 2013-08-23 | Disposition: A | Discharge status: Home / Self Care | Payer: BC Managed Care – PPO | Source: Ambulatory Visit

## 2013-08-23 ENCOUNTER — Ambulatory Visit (INDEPENDENT_AMBULATORY_CARE_PROVIDER_SITE_OTHER): Payer: BC Managed Care – PPO | Admitting: Gynecology

## 2013-08-23 ENCOUNTER — Other Ambulatory Visit (HOSPITAL_COMMUNITY)
Admission: RE | Admit: 2013-08-23 | Discharge: 2013-08-23 | Disposition: A | Discharge status: Home / Self Care | Payer: BC Managed Care – PPO | Source: Ambulatory Visit | Attending: Gynecology | Admitting: Gynecology

## 2013-08-23 ENCOUNTER — Encounter: Payer: Self-pay | Admitting: Gynecology

## 2013-08-23 VITALS — BP 134/90 | Ht 62.0 in | Wt 260.0 lb

## 2013-08-23 DIAGNOSIS — Z113 Encounter for screening for infections with a predominantly sexual mode of transmission: Secondary | ICD-10-CM

## 2013-08-23 DIAGNOSIS — N898 Other specified noninflammatory disorders of vagina: Secondary | ICD-10-CM

## 2013-08-23 DIAGNOSIS — Z01419 Encounter for gynecological examination (general) (routine) without abnormal findings: Secondary | ICD-10-CM | POA: Insufficient documentation

## 2013-08-23 DIAGNOSIS — Z1231 Encounter for screening mammogram for malignant neoplasm of breast: Secondary | ICD-10-CM

## 2013-08-23 LAB — WET PREP FOR TRICH, YEAST, CLUE
Clue Cells Wet Prep HPF POC: NONE SEEN
Trich, Wet Prep: NONE SEEN
WBC, Wet Prep HPF POC: NONE SEEN
Yeast Wet Prep HPF POC: NONE SEEN

## 2013-08-23 MED ORDER — FLUCONAZOLE 150 MG PO TABS: 150.0000 mg | ORAL_TABLET | Freq: Once | ORAL | Status: DC

## 2013-08-23 NOTE — Progress Notes (Addendum)
Kelly Rowe [birth year] [id number]   History:    43 y.o.  for annual gyn exam who recently was treated for an upper respiratory tract infection has complained of right ear pressure. She finished her antibiotics 2 days ago. She was wondered if she had a yeast infection.  The patient has a history of HIV and is being followed by her physician at [facility] where she goes every 3 months and has her counts done bare. 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 Dr. Jenny Reichmann is her primary physician who has been treating her for type 2 diabetes as well as for hypertension and he has been doing her blood work. Patient's last mammogram was in 2013 she scheduled for later today. Patient's last Pap smear 2013 was normal. Patient is up-to-date on her flu vaccine. Patient has not been sexually active in 4 months. Patient's past history as follows:  Chlamydia infection 2009  HIV infection 2009  Trichomoniasis 2011  HSV  GC 2008   Past medical history,surgical history, family history and social history were all reviewed and documented in the EPIC chart.  Gynecologic History Patient's last menstrual period was 06/21/2010. Contraception: Prior hysterectomy Last Pap: 2013. Results were: normal Last mammogram: 2013. Results were: normal  Obstetric History OB History  Gravida Para Term Preterm AB SAB TAB Ectopic Multiple Living  7 5 5  2 2    5     # Outcome Date GA Lbr Len/2nd Weight Sex Delivery Anes PTL Lv  7 SAB           6 SAB           5 TRM     M SVD  N Y  4 TRM     M SVD  N Y  3 TRM     M SVD  N Y  2 TRM     F SVD  N Y  1 TRM     M SVD  N Y       ROS: A ROS was performed and pertinent positives and negatives are included in the history.  GENERAL: No fevers or chills. HEENT: No change in vision, no earache, sore throat or sinus congestion. NECK: No pain or stiffness. CARDIOVASCULAR: No chest pain or pressure. No  palpitations. PULMONARY: No shortness of breath, cough or wheeze. GASTROINTESTINAL: No abdominal pain, nausea, vomiting or diarrhea, melena or bright red blood per rectum. GENITOURINARY: No urinary frequency, urgency, hesitancy or dysuria. MUSCULOSKELETAL: No joint or muscle pain, no back pain, no recent trauma. DERMATOLOGIC: No rash, no itching, no lesions. ENDOCRINE: No polyuria, polydipsia, no heat or cold intolerance. No recent change in weight. HEMATOLOGICAL: No anemia or easy bruising or bleeding. NEUROLOGIC: No headache, seizures, numbness, tingling or weakness. PSYCHIATRIC: No depression, no loss of interest in normal activity or change in sleep pattern.     Exam: chaperone present  BP 134/90  Ht 5\' 2"  (1.575 m)  Wt 260 lb (117.935 kg)  BMI 47.54 kg/m2  LMP 06/21/2010  Body mass index is 47.54 kg/(m^2).  General appearance : Well developed well nourished female. No acute distress HEENT: Neck supple, trachea midline, no carotid bruits, no thyroidmegaly Lungs: Clear to auscultation, no rhonchi or wheezes, or rib retractions  Heart: Regular rate and rhythm, no murmurs or gallops Breast:Examined in sitting and supine position were symmetrical in appearance, no palpable masses or tenderness,  no skin retraction, no nipple inversion, no  nipple discharge, no skin discoloration, no axillary or supraclavicular lymphadenopathy Abdomen: no palpable masses or tenderness, no rebound or guarding Extremities: no edema or skin discoloration or tenderness  Pelvic:  Bartholin, Urethra, Skene Glands: Within normal limits             Vagina: No gross lesions or discharge  Cervix: Absent  Uterus  absent  Adnexa  Without masses or tenderness  Anus and perineum  normal   Rectovaginal  normal sphincter tone without palpated masses or tenderness             Hemoccult not indicated   Wet prep negative GC and chlamydia culture pending at time of this dictation  Assessment/Plan:  43 y.o. female for  annual exam with history of HIV has been followed by her physician at [facility] for her counts every 3 months. Patient has followup with her PCP tomorrow as a result of her upper respiratory tract infection that she was treated recently. Pap smear was done today. GC and Chlamydia culture was done results pending. Patient was reminded to do her monthly breast exams. Prescription provided for patient to have her shingles vaccine.  Note: This dictation was prepared with  Dragon/digital dictation along withSmart phrase technology. Any transcriptional errors that result from this process are unintentional.   Terrance Mass MD, 10:51 AM 08/23/2013

## 2013-08-24 ENCOUNTER — Encounter: Payer: Self-pay | Admitting: Internal Medicine

## 2013-08-24 ENCOUNTER — Ambulatory Visit (INDEPENDENT_AMBULATORY_CARE_PROVIDER_SITE_OTHER): Payer: BC Managed Care – PPO | Admitting: Internal Medicine

## 2013-08-24 VITALS — BP 138/94 | HR 80 | Temp 98.2°F | Resp 16 | Wt 261.0 lb

## 2013-08-24 DIAGNOSIS — H6691 Otitis media, unspecified, right ear: Secondary | ICD-10-CM | POA: Insufficient documentation

## 2013-08-24 DIAGNOSIS — H6690 Otitis media, unspecified, unspecified ear: Secondary | ICD-10-CM

## 2013-08-24 LAB — GC/CHLAMYDIA PROBE AMP
CT Probe RNA: NEGATIVE
GC Probe RNA: NEGATIVE

## 2013-08-24 MED ORDER — FLUTICASONE PROPIONATE 50 MCG/ACT NA SUSP: 2.0000 | Freq: Every day | NASAL | Status: DC

## 2013-08-24 MED ORDER — AZITHROMYCIN 250 MG PO TABS: ORAL_TABLET | ORAL | Status: DC

## 2013-08-24 NOTE — Patient Instructions (Addendum)
Use over-the-counter  "cold" medicines  such as  "Afrin" nasal spray for nasal congestion as directed instead. Use" Delsym" or" Robitussin" cough syrup varietis for cough.  You can use plain "Tylenol" or "Advil" for fever, chills and achyness.  Please, make an appointment if you are not better or if you're worse.     Milk free trial (no milk, ice cream, cheese and yogurt) for 4-6 weeks. OK to use almond, coconut, rice or soy milk. "Almond breeze" brand tastes good.

## 2013-08-24 NOTE — Progress Notes (Signed)
Subjective:    HPI  Here with acute onset of R ear discomfort, "echo" sound x day.   Pt denies polydipsia, polyuria, or low sugar symptoms such as weakness or confusion improved with po intake.  Pt states overall good compliance with meds, trying to follow lower cholesterol, diabetic diet  Past Medical History  Diagnosis Date  . ANEMIA-IRON DEFICIENCY 01/30/2010  . ANXIETY 11/27/2007  . Carbuncle and furuncle of trunk 04/16/2010  . DIABETES MELLITUS, TYPE II 08/02/2007  . Edema 01/30/2010  . ELEVATED BLOOD PRESSURE WITHOUT DIAGNOSIS OF HYPERTENSION 08/02/2007  . FREQUENCY, URINARY 05/19/2009  . GENITAL HERPES 03/12/2009  . HIV INFECTION 03/12/2009  . HYPERLIPIDEMIA 08/02/2007  . Metrorrhagia 03/21/2008  . RETENTION, URINE 05/19/2009  . VITAMIN D DEFICIENCY 01/30/2010  . Chlamydia infection 03/21/2008  . HSV (herpes simplex virus) infection   . Trichomonas infection 01/19/2010  . Asthma 02/25/2011  . Arthritis     both knees  . HIV (human immunodeficiency virus infection) 2009   Past Surgical History  Procedure Laterality Date  . Cholecystectomy    . Tubal ligation    . Endometrial ablation  01/11/2008    HER OPTION  . Abdominal hysterectomy  07/22/2010    TAH WITH PRESERVATION OF BOTH TUBES AND OVARIES    reports that she has never smoked. She has never used smokeless tobacco. She reports that she drinks alcohol. She reports that she does not use illicit drugs. family history includes Breast cancer in her paternal aunt; Breast cancer (age of onset: 60) in her maternal uncle; Cancer in her father; Diabetes in her father and mother; Heart disease in her other; Hypertension in her mother; Prostate cancer in her other; Stroke in her other and paternal uncle. Allergies  Allergen Reactions  . Levaquin [Levofloxacin In D5w] Nausea And Vomiting  . Penicillins     REACTION: rash   Current Outpatient Prescriptions on File Prior to Visit  Medication Sig Dispense Refill  . acetaminophen (TYLENOL)  500 MG tablet Take 500 mg by mouth as needed.        Marland Kitchen albuterol (PROVENTIL HFA) 108 (90 BASE) MCG/ACT inhaler Inhale 2 puffs into the lungs every 6 (six) hours as needed for wheezing.  3 Inhaler  5  . ALPRAZolam (XANAX) 0.25 MG tablet Take 0.25 mg by mouth 3 (three) times daily as needed.        Marland Kitchen aspirin 81 MG tablet Take 81 mg by mouth daily.        Marland Kitchen atazanavir (REYATAZ) 300 MG capsule Take 300 mg by mouth daily with breakfast.        . azithromycin (ZITHROMAX Z-PAK) 250 MG tablet Use as directed  6 each  1  . Cholecalciferol (VITAMIN D3) 1000 UNITS CAPS Take by mouth daily.        . ferrous fumarate (HEMOCYTE - 106 MG FE) 325 (106 FE) MG TABS Take 1 tablet by mouth.      . fluconazole (DIFLUCAN) 150 MG tablet Take 1 tablet (150 mg total) by mouth once.  1 tablet  0  . Fluticasone-Salmeterol (ADVAIR DISKUS) 250-50 MCG/DOSE AEPB Inhale 1 puff into the lungs 2 (two) times daily.  1 each  11  . glucose blood (ACCU-CHEK SMARTVIEW) test strip Use as directed once daily to check blood sugar.  Diagnosis code 250.00  100 each  11  . hydrochlorothiazide (HYDRODIURIL) 12.5 MG tablet Take 12.5 mg by mouth daily.        Marland Kitchen Hydrocod Polst-Chlorphen Polst  10-8 MG CP12 Take 1 tablet by mouth 2 (two) times daily as needed.  25 each  1  . HYDROcodone-acetaminophen (NORCO/VICODIN) 5-325 MG per tablet TAKE ONE TABLET BY MOUTH EVERY 6 HOURS AS NEEDED  60 tablet  0  . HYDROcodone-homatropine (HYCODAN) 5-1.5 MG/5ML syrup Take 5 mLs by mouth every 6 (six) hours as needed for cough.  180 mL  0  . metFORMIN (GLUCOPHAGE) 500 MG tablet 2 tabs by mouth in the AM, and 1 in the PM with meals  270 tablet  3  . ondansetron (ZOFRAN) 4 MG tablet Take 1 tablet (4 mg total) by mouth every 8 (eight) hours as needed for nausea.  40 tablet  1   No current facility-administered medications on file prior to visit.    .  Review of Systems  Constitutional: Negative for unexpected weight change, or unusual diaphoresis  HENT:  Negative for tinnitus.   Eyes: Negative for photophobia and visual disturbance.  Respiratory: Negative for choking and stridor.   Gastrointestinal: Negative for vomiting and blood in stool.  Genitourinary: Negative for hematuria and decreased urine volume.  Musculoskeletal: Negative for acute joint swelling Skin: Negative for color change and wound.  Neurological: Negative for tremors and numbness other than noted  Psychiatric/Behavioral: Negative for decreased concentration or  hyperactivity.       Objective:   Physical Exam BP 138/94  Pulse 80  Temp(Src) 98.2 F (36.8 C) (Oral)  Resp 16  Wt 261 lb (118.389 kg)  LMP 06/21/2010 VS noted, mild ill Constitutional: Pt appears well-developed and well-nourished.  HENT: Head: NCAT.  Right Ear: External ear normal.  Left Ear: External ear normal.  Bilat tm's with mild erythema.  Max sinus areas non tender.  Pharynx with mild erythema, no exudate Eyes: Conjunctivae and EOM are normal. Pupils are equal, round, and reactive to light.  Neck: Normal range of motion. Neck supple.  Cardiovascular: Normal rate and regular rhythm.   Pulmonary/Chest: Effort normal and breath sounds somewhat decreased without frank wheezing or rales.  Neurological: Pt is alert. Not confused  Skin: Skin is warm. No erythema.  Psychiatric: Pt behavior is normal. Thought content normal.         Assessment & Plan:

## 2013-08-24 NOTE — Assessment & Plan Note (Signed)
Repeat Z pac Flonase nasal

## 2013-08-24 NOTE — Progress Notes (Signed)
Pre visit review using our clinic review tool, if applicable. No additional management support is needed unless otherwise documented below in the visit note. 

## 2013-08-26 ENCOUNTER — Encounter: Payer: Self-pay | Admitting: Internal Medicine

## 2013-08-28 ENCOUNTER — Other Ambulatory Visit: Payer: Self-pay | Admitting: Internal Medicine

## 2013-08-28 DIAGNOSIS — R928 Other abnormal and inconclusive findings on diagnostic imaging of breast: Secondary | ICD-10-CM

## 2013-08-28 LAB — HM MAMMOGRAPHY

## 2013-09-06 ENCOUNTER — Ambulatory Visit
Admission: RE | Admit: 2013-09-06 | Discharge: 2013-09-06 | Disposition: A | Discharge status: Home / Self Care | Payer: Self-pay | Source: Ambulatory Visit | Attending: Internal Medicine | Admitting: Internal Medicine

## 2013-09-06 DIAGNOSIS — R928 Other abnormal and inconclusive findings on diagnostic imaging of breast: Secondary | ICD-10-CM

## 2014-01-24 ENCOUNTER — Telehealth: Payer: Self-pay | Admitting: *Deleted

## 2014-01-24 DIAGNOSIS — E119 Type 2 diabetes mellitus without complications: Secondary | ICD-10-CM

## 2014-01-24 NOTE — Telephone Encounter (Signed)
Patient scheduled appointment for DM follow up.  Lipid, a1c, bmet ordered.

## 2014-01-31 ENCOUNTER — Encounter: Payer: Self-pay | Admitting: Internal Medicine

## 2014-01-31 ENCOUNTER — Ambulatory Visit (INDEPENDENT_AMBULATORY_CARE_PROVIDER_SITE_OTHER): Payer: BC Managed Care – PPO | Admitting: Internal Medicine

## 2014-01-31 VITALS — BP 134/90 | HR 89 | Temp 98.3°F | Ht 62.0 in | Wt 267.0 lb

## 2014-01-31 DIAGNOSIS — Z0000 Encounter for general adult medical examination without abnormal findings: Secondary | ICD-10-CM

## 2014-01-31 DIAGNOSIS — E785 Hyperlipidemia, unspecified: Secondary | ICD-10-CM

## 2014-01-31 DIAGNOSIS — IMO0001 Reserved for inherently not codable concepts without codable children: Secondary | ICD-10-CM

## 2014-01-31 DIAGNOSIS — E1165 Type 2 diabetes mellitus with hyperglycemia: Secondary | ICD-10-CM

## 2014-01-31 DIAGNOSIS — E119 Type 2 diabetes mellitus without complications: Secondary | ICD-10-CM

## 2014-01-31 NOTE — Progress Notes (Signed)
Pre visit review using our clinic review tool, if applicable. No additional management support is needed unless otherwise documented below in the visit note. 

## 2014-01-31 NOTE — Progress Notes (Signed)
Subjective:    Patient ID: Kelly Rowe, female    DOB: [birth year], 43 y.o.   MRN: [id number]  HPI  Here for wellness and f/u;  Overall doing ok;  Pt denies CP, worsening SOB, DOE, wheezing, orthopnea, PND, worsening LE edema, palpitations, dizziness or syncope.  Pt denies neurological change such as new headache, facial or extremity weakness.  Pt denies polydipsia, polyuria, or low sugar symptoms. Pt states overall good compliance with treatment and medications, good tolerability, and has been trying to follow lower cholesterol diet.  Pt denies worsening depressive symptoms, suicidal ideation or panic. No fever, night sweats, wt loss, loss of appetite, or other constitutional symptoms.  Pt states good ability with ADL's, has low fall risk, home safety reviewed and adequate, no other significant changes in hearing or vision, and only occasionally active with exercise. No current complaints Past Medical History  Diagnosis Date  . ANEMIA-IRON DEFICIENCY 01/30/2010  . ANXIETY 11/27/2007  . Carbuncle and furuncle of trunk 04/16/2010  . DIABETES MELLITUS, TYPE II 08/02/2007  . Edema 01/30/2010  . ELEVATED BLOOD PRESSURE WITHOUT DIAGNOSIS OF HYPERTENSION 08/02/2007  . FREQUENCY, URINARY 05/19/2009  . GENITAL HERPES 03/12/2009  . HIV INFECTION 03/12/2009  . HYPERLIPIDEMIA 08/02/2007  . Metrorrhagia 03/21/2008  . RETENTION, URINE 05/19/2009  . VITAMIN D DEFICIENCY 01/30/2010  . Chlamydia infection 03/21/2008  . HSV (herpes simplex virus) infection   . Trichomonas infection 01/19/2010  . Asthma 02/25/2011  . Arthritis     both knees  . HIV (human immunodeficiency virus infection) 2009   Past Surgical History  Procedure Laterality Date  . Cholecystectomy    . Tubal ligation    . Endometrial ablation  01/11/2008    HER OPTION  . Abdominal hysterectomy  07/22/2010    TAH WITH PRESERVATION OF BOTH TUBES AND OVARIES    reports that she has never smoked. She has never used smokeless tobacco. She reports  that she drinks alcohol. She reports that she does not use illicit drugs. family history includes Breast cancer in her paternal aunt; Breast cancer (age of onset: 57) in her maternal uncle; Cancer in her father; Diabetes in her father and mother; Heart disease in her other; Hypertension in her mother; Prostate cancer in her other; Stroke in her other and paternal uncle. Allergies  Allergen Reactions  . Levaquin [Levofloxacin In D5w] Nausea And Vomiting  . Penicillins     REACTION: rash   Current Outpatient Prescriptions on File Prior to Visit  Medication Sig Dispense Refill  . acetaminophen (TYLENOL) 500 MG tablet Take 500 mg by mouth as needed.        Marland Kitchen albuterol (PROVENTIL HFA) 108 (90 BASE) MCG/ACT inhaler Inhale 2 puffs into the lungs every 6 (six) hours as needed for wheezing.  3 Inhaler  5  . ALPRAZolam (XANAX) 0.25 MG tablet Take 0.25 mg by mouth 3 (three) times daily as needed.        Marland Kitchen aspirin 81 MG tablet Take 81 mg by mouth daily.        Marland Kitchen atazanavir (REYATAZ) 300 MG capsule Take 300 mg by mouth daily with breakfast.        . azithromycin (ZITHROMAX Z-PAK) 250 MG tablet Use as directed  6 each  0  . Cholecalciferol (VITAMIN D3) 1000 UNITS CAPS Take by mouth daily.        . ferrous fumarate (HEMOCYTE - 106 MG FE) 325 (106 FE) MG TABS Take 1 tablet by mouth.      Marland Kitchen  fluconazole (DIFLUCAN) 150 MG tablet Take 1 tablet (150 mg total) by mouth once.  1 tablet  0  . fluticasone (FLONASE) 50 MCG/ACT nasal spray Place 2 sprays into both nostrils daily.  16 g  1  . Fluticasone-Salmeterol (ADVAIR DISKUS) 250-50 MCG/DOSE AEPB Inhale 1 puff into the lungs 2 (two) times daily.  1 each  11  . glucose blood (ACCU-CHEK SMARTVIEW) test strip Use as directed once daily to check blood sugar.  Diagnosis code 250.00  100 each  11  . hydrochlorothiazide (HYDRODIURIL) 12.5 MG tablet Take 12.5 mg by mouth daily.        . Hydrocod Polst-Chlorphen Polst 10-8 MG CP12 Take 1 tablet by mouth 2 (two) times daily  as needed.  25 each  1  . HYDROcodone-acetaminophen (NORCO/VICODIN) 5-325 MG per tablet TAKE ONE TABLET BY MOUTH EVERY 6 HOURS AS NEEDED  60 tablet  0  . HYDROcodone-homatropine (HYCODAN) 5-1.5 MG/5ML syrup Take 5 mLs by mouth every 6 (six) hours as needed for cough.  180 mL  0  . metFORMIN (GLUCOPHAGE) 500 MG tablet 2 tabs by mouth in the AM, and 1 in the PM with meals  270 tablet  3  . ondansetron (ZOFRAN) 4 MG tablet Take 1 tablet (4 mg total) by mouth every 8 (eight) hours as needed for nausea.  40 tablet  1   No current facility-administered medications on file prior to visit.   Review of Systems  Constitutional: Negative for increased diaphoresis, other activity, appetite or other siginficant weight change  HENT: Negative for worsening hearing loss, ear pain, facial swelling, mouth sores and neck stiffness.   Eyes: Negative for other worsening pain, redness or visual disturbance.  Respiratory: Negative for shortness of breath and wheezing.   Cardiovascular: Negative for chest pain and palpitations.  Gastrointestinal: Negative for diarrhea, blood in stool, abdominal distention or other pain Genitourinary: Negative for hematuria, flank pain or change in urine volume.  Musculoskeletal: Negative for myalgias or other joint complaints.  Skin: Negative for color change and wound.  Neurological: Negative for syncope and numbness. other than noted Hematological: Negative for adenopathy. or other swelling Psychiatric/Behavioral: Negative for hallucinations, self-injury, decreased concentration or other worsening agitation.      Objective:   Physical Exam BP 134/90  Pulse 89  Temp(Src) 98.3 F (36.8 C) (Oral)  Ht 5\' 2"  (1.575 m)  Wt 267 lb (121.11 kg)  BMI 48.82 kg/m2  SpO2 96%  LMP 06/21/2010 VS noted,  Constitutional: Pt is oriented to person, place, and time. Appears well-developed and well-nourished.  Head: Normocephalic and atraumatic.  Right Ear: External ear normal.  Left  Ear: External ear normal.  Nose: Nose normal.  Mouth/Throat: Oropharynx is clear and moist.  Eyes: Conjunctivae and EOM are normal. Pupils are equal, round, and reactive to light.  Neck: Normal range of motion. Neck supple. No JVD present. No tracheal deviation present.  Cardiovascular: Normal rate, regular rhythm, normal heart sounds and intact distal pulses.   Pulmonary/Chest: Effort normal and breath sounds without rales or wheezing  Abdominal: Soft. Bowel sounds are normal. NT. No HSM  Musculoskeletal: Normal range of motion. Exhibits no edema.  Lymphadenopathy:  Has no cervical adenopathy.  Neurological: Pt is alert and oriented to person, place, and time. Pt has normal reflexes. No cranial nerve deficit. Motor grossly intact Skin: Skin is warm and dry. No rash noted.  Psychiatric:  Has normal mood and affect. Behavior is normal.     Assessment & Plan:

## 2014-01-31 NOTE — Patient Instructions (Signed)

## 2014-02-03 NOTE — Assessment & Plan Note (Signed)
stable overall by history and exam, recent data reviewed with pt, and pt to continue medical treatment as before,  to f/u any worsening symptoms or concerns Lab Results  Component Value Date   HGBA1C 7.6* 12/26/2012

## 2014-02-03 NOTE — Assessment & Plan Note (Signed)

## 2014-02-03 NOTE — Assessment & Plan Note (Signed)
stable overall by history and exam, and pt to continue medical treatment as before,  to f/u any worsening symptoms or concerns 

## 2014-02-04 ENCOUNTER — Other Ambulatory Visit (INDEPENDENT_AMBULATORY_CARE_PROVIDER_SITE_OTHER): Payer: BC Managed Care – PPO

## 2014-02-04 DIAGNOSIS — E1165 Type 2 diabetes mellitus with hyperglycemia: Principal | ICD-10-CM

## 2014-02-04 DIAGNOSIS — IMO0001 Reserved for inherently not codable concepts without codable children: Secondary | ICD-10-CM

## 2014-02-04 DIAGNOSIS — Z0000 Encounter for general adult medical examination without abnormal findings: Secondary | ICD-10-CM

## 2014-02-04 LAB — HEMOGLOBIN A1C: Hgb A1c MFr Bld: 7.6 % — ABNORMAL HIGH (ref 4.6–6.5)

## 2014-02-04 LAB — HEPATIC FUNCTION PANEL
ALT: 21 U/L (ref 0–35)
AST: 20 U/L (ref 0–37)
Albumin: 3.7 g/dL (ref 3.5–5.2)
Alkaline Phosphatase: 105 U/L (ref 39–117)
Bilirubin, Direct: 0.1 mg/dL (ref 0.0–0.3)
Total Bilirubin: 0.6 mg/dL (ref 0.2–1.2)
Total Protein: 7.5 g/dL (ref 6.0–8.3)

## 2014-02-04 LAB — BASIC METABOLIC PANEL
BUN: 9 mg/dL (ref 6–23)
CO2: 24 mEq/L (ref 19–32)
Calcium: 8.7 mg/dL (ref 8.4–10.5)
Chloride: 104 mEq/L (ref 96–112)
Creatinine, Ser: 0.7 mg/dL (ref 0.4–1.2)
GFR: 122.34 mL/min (ref >60.00)
Glucose, Bld: 145 mg/dL — ABNORMAL HIGH (ref 70–99)
Potassium: 4.4 mEq/L (ref 3.5–5.1)
Sodium: 137 mEq/L (ref 135–145)

## 2014-02-04 LAB — CBC WITH DIFFERENTIAL/PLATELET
Basophils Absolute: 0.1 10*3/uL (ref 0.0–0.1)
Basophils Relative: 1.3 % (ref 0.0–3.0)
Eosinophils Absolute: 0 10*3/uL (ref 0.0–0.7)
Eosinophils Relative: 0.6 % (ref 0.0–5.0)
HCT: 39.3 % (ref 36.0–46.0)
Hemoglobin: 12.6 g/dL (ref 12.0–15.0)
Lymphocytes Relative: 39.2 % (ref 12.0–46.0)
Lymphs Abs: 1.9 10*3/uL (ref 0.7–4.0)
MCHC: 32.1 g/dL (ref 30.0–36.0)
MCV: 84.4 fl (ref 78.0–100.0)
Monocytes Absolute: 0.5 10*3/uL (ref 0.1–1.0)
Monocytes Relative: 9.7 % (ref 3.0–12.0)
Neutro Abs: 2.3 10*3/uL (ref 1.4–7.7)
Neutrophils Relative %: 49.2 % (ref 43.0–77.0)
Platelets: 309 10*3/uL (ref 150.0–400.0)
RBC: 4.66 Mil/uL (ref 3.87–5.11)
RDW: 16.1 % — ABNORMAL HIGH (ref 11.5–15.5)
WBC: 4.7 10*3/uL (ref 4.0–10.5)

## 2014-02-04 LAB — URINALYSIS, ROUTINE W REFLEX MICROSCOPIC
Bilirubin Urine: NEGATIVE
Hgb urine dipstick: NEGATIVE
Ketones, ur: NEGATIVE
Leukocytes, UA: NEGATIVE
Nitrite: NEGATIVE
Specific Gravity, Urine: 1.02 (ref 1.000–1.030)
Total Protein, Urine: NEGATIVE
Urine Glucose: NEGATIVE
Urobilinogen, UA: 0.2 (ref 0.0–1.0)
pH: 5.5 (ref 5.0–8.0)

## 2014-02-04 LAB — MICROALBUMIN / CREATININE URINE RATIO
Creatinine,U: 113.6 mg/dL
Microalb Creat Ratio: 0.6 mg/g (ref 0.0–30.0)
Microalb, Ur: 0.7 mg/dL (ref 0.0–1.9)

## 2014-02-04 LAB — TSH: TSH: 4.03 u[IU]/mL (ref 0.35–4.50)

## 2014-02-04 LAB — LIPID PANEL
Cholesterol: 211 mg/dL — ABNORMAL HIGH (ref 0–200)
HDL: 52.3 mg/dL (ref >39.00)
LDL Cholesterol: 142 mg/dL — ABNORMAL HIGH (ref 0–99)
NonHDL: 158.7
Total CHOL/HDL Ratio: 4
Triglycerides: 83 mg/dL (ref 0.0–149.0)
VLDL: 16.6 mg/dL (ref 0.0–40.0)

## 2014-02-05 ENCOUNTER — Other Ambulatory Visit: Payer: Self-pay | Admitting: Internal Medicine

## 2014-02-05 ENCOUNTER — Encounter: Payer: Self-pay | Admitting: Internal Medicine

## 2014-02-05 MED ORDER — ATORVASTATIN CALCIUM 10 MG PO TABS: 10.0000 mg | ORAL_TABLET | Freq: Every day | ORAL | Status: DC

## 2014-02-05 MED ORDER — METFORMIN HCL 1000 MG PO TABS: 1000.0000 mg | ORAL_TABLET | Freq: Two times a day (BID) | ORAL | Status: DC

## 2014-05-15 ENCOUNTER — Encounter: Payer: Self-pay | Admitting: Gynecology

## 2014-05-15 ENCOUNTER — Ambulatory Visit (INDEPENDENT_AMBULATORY_CARE_PROVIDER_SITE_OTHER): Payer: BC Managed Care – PPO | Admitting: Gynecology

## 2014-05-15 DIAGNOSIS — N898 Other specified noninflammatory disorders of vagina: Secondary | ICD-10-CM

## 2014-05-15 LAB — WET PREP FOR TRICH, YEAST, CLUE
Trich, Wet Prep: NONE SEEN
WBC, Wet Prep HPF POC: NONE SEEN
Yeast Wet Prep HPF POC: NONE SEEN

## 2014-05-15 NOTE — Progress Notes (Signed)
Kelly Rowe [birth year] [id number]        43 y.o.  [id number] Presents with several day history of vaginal irritation. No real discharge or odor. Used a cream that was prescribed for her several years ago but her symptoms seem to have persisted. No urinary symptoms such as frequency dysuria urgency.  Past medical history,surgical history, problem list, medications, allergies, family history and social history were all reviewed and documented in the EPIC chart.  Directed ROS with pertinent positives and negatives documented in the history of present illness/assessment and plan.  Exam: Kelly Rowe General appearance:  Normal External BUS vagina with cream in the vagina. Bimanual without gross masses or tenderness.  Assessment/Plan:  Kelly Rowe with history as above. Wet prep is consistent with bacterial vaginosis showing amine and clue cells. No yeast or trichomonas. We'll treat with Flagyl 500 mg twice a day x7 days, alcohol avoidance reviewed. Follow up if symptoms persist, worsen or recur.     Anastasio Auerbach MD, 12:46 PM 05/15/2014

## 2014-05-15 NOTE — Patient Instructions (Signed)
Take Flagyl oral antibiotic twice daily for 7 days. Follow up if your vaginal irritation continues, worsens or recurs.

## 2014-05-16 LAB — URINALYSIS W MICROSCOPIC + REFLEX CULTURE
Bilirubin Urine: NEGATIVE
Casts: NONE SEEN
Crystals: NONE SEEN
Glucose, UA: NEGATIVE mg/dL
Hgb urine dipstick: NEGATIVE
Ketones, ur: NEGATIVE mg/dL
Nitrite: NEGATIVE
Protein, ur: NEGATIVE mg/dL
Specific Gravity, Urine: 1.027 (ref 1.005–1.030)
Urobilinogen, UA: 1 mg/dL (ref 0.0–1.0)
pH: 6.5 (ref 5.0–8.0)

## 2014-05-17 LAB — URINE CULTURE: Colony Count: 5000

## 2014-05-21 ENCOUNTER — Telehealth: Payer: Self-pay | Admitting: *Deleted

## 2014-05-21 MED ORDER — FLUCONAZOLE 150 MG PO TABS: 150.0000 mg | ORAL_TABLET | Freq: Once | ORAL | Status: DC

## 2014-05-21 NOTE — Telephone Encounter (Signed)
Diflucan 150mg 1 dose

## 2014-05-21 NOTE — Telephone Encounter (Signed)
Rx sent, left on voicemail this has been done. 

## 2014-05-21 NOTE — Telephone Encounter (Signed)
Pt called c/o yeast infection from taking Flagyl 500 mg on OV 05/15/14. Itching requesting diflucan tablet. Please advise

## 2014-05-27 ENCOUNTER — Encounter: Payer: Self-pay | Admitting: Gynecology

## 2014-06-12 ENCOUNTER — Ambulatory Visit: Payer: BC Managed Care – PPO | Admitting: Internal Medicine

## 2014-09-21 ENCOUNTER — Encounter (HOSPITAL_BASED_OUTPATIENT_CLINIC_OR_DEPARTMENT_OTHER): Payer: Self-pay | Admitting: *Deleted

## 2014-09-21 ENCOUNTER — Emergency Department (HOSPITAL_BASED_OUTPATIENT_CLINIC_OR_DEPARTMENT_OTHER)
Admission: EM | Admit: 2014-09-21 | Discharge: 2014-09-21 | Disposition: A | Discharge status: Home / Self Care | Payer: No Typology Code available for payment source | Attending: Emergency Medicine | Admitting: Emergency Medicine

## 2014-09-21 DIAGNOSIS — I10 Essential (primary) hypertension: Secondary | ICD-10-CM | POA: Diagnosis not present

## 2014-09-21 DIAGNOSIS — Z8659 Personal history of other mental and behavioral disorders: Secondary | ICD-10-CM | POA: Diagnosis not present

## 2014-09-21 DIAGNOSIS — E785 Hyperlipidemia, unspecified: Secondary | ICD-10-CM | POA: Insufficient documentation

## 2014-09-21 DIAGNOSIS — Z8619 Personal history of other infectious and parasitic diseases: Secondary | ICD-10-CM | POA: Diagnosis not present

## 2014-09-21 DIAGNOSIS — Z7982 Long term (current) use of aspirin: Secondary | ICD-10-CM | POA: Insufficient documentation

## 2014-09-21 DIAGNOSIS — M1990 Unspecified osteoarthritis, unspecified site: Secondary | ICD-10-CM | POA: Diagnosis not present

## 2014-09-21 DIAGNOSIS — Z8742 Personal history of other diseases of the female genital tract: Secondary | ICD-10-CM | POA: Diagnosis not present

## 2014-09-21 DIAGNOSIS — D649 Anemia, unspecified: Secondary | ICD-10-CM | POA: Insufficient documentation

## 2014-09-21 DIAGNOSIS — Z21 Asymptomatic human immunodeficiency virus [HIV] infection status: Secondary | ICD-10-CM | POA: Insufficient documentation

## 2014-09-21 DIAGNOSIS — E559 Vitamin D deficiency, unspecified: Secondary | ICD-10-CM | POA: Diagnosis not present

## 2014-09-21 DIAGNOSIS — Z79899 Other long term (current) drug therapy: Secondary | ICD-10-CM | POA: Diagnosis not present

## 2014-09-21 DIAGNOSIS — E119 Type 2 diabetes mellitus without complications: Secondary | ICD-10-CM | POA: Insufficient documentation

## 2014-09-21 DIAGNOSIS — Z872 Personal history of diseases of the skin and subcutaneous tissue: Secondary | ICD-10-CM | POA: Insufficient documentation

## 2014-09-21 DIAGNOSIS — M79604 Pain in right leg: Secondary | ICD-10-CM

## 2014-09-21 DIAGNOSIS — Y92410 Unspecified street and highway as the place of occurrence of the external cause: Secondary | ICD-10-CM | POA: Diagnosis not present

## 2014-09-21 DIAGNOSIS — Y998 Other external cause status: Secondary | ICD-10-CM | POA: Diagnosis not present

## 2014-09-21 DIAGNOSIS — Y9389 Activity, other specified: Secondary | ICD-10-CM | POA: Insufficient documentation

## 2014-09-21 DIAGNOSIS — Z880 Allergy status to penicillin: Secondary | ICD-10-CM | POA: Diagnosis not present

## 2014-09-21 DIAGNOSIS — Z7951 Long term (current) use of inhaled steroids: Secondary | ICD-10-CM | POA: Diagnosis not present

## 2014-09-21 DIAGNOSIS — J45909 Unspecified asthma, uncomplicated: Secondary | ICD-10-CM | POA: Diagnosis not present

## 2014-09-21 DIAGNOSIS — S8991XA Unspecified injury of right lower leg, initial encounter: Secondary | ICD-10-CM | POA: Insufficient documentation

## 2014-09-21 MED ORDER — KETOROLAC TROMETHAMINE 30 MG/ML IJ SOLN
30.0000 mg | Freq: Once | INTRAMUSCULAR | Status: AC
  Administered 2014-09-21: 30 mg via INTRAMUSCULAR
  Filled 2014-09-21: qty 1

## 2014-09-21 MED ORDER — CYCLOBENZAPRINE HCL 10 MG PO TABS: 10.0000 mg | ORAL_TABLET | Freq: Two times a day (BID) | ORAL | Status: DC | PRN
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
## 2014-09-21 NOTE — Discharge Instructions (Signed)
For pain control you may take up to 800mg of ibuprofen (that is usually 4 over the counter pills)  3 times a day (take with food) and acetaminophen 975mg (this is 3 over the counter pills) four times a day. Do not drink alcohol or combine with other medications that have acetaminophen as an ingredient (Read the labels!).  ° ° For breakthrough pain you may take Flexeril. Do not drink alcohol, drive or operate heavy machinery when taking Flexeril. ° °Please follow with your primary care doctor in the next 2 days for a check-up. They must obtain records for further management.  ° °Do not hesitate to return to the Emergency Department for any new, worsening or concerning symptoms.  ° ° °

## 2015-02-06 ENCOUNTER — Other Ambulatory Visit: Payer: Self-pay | Admitting: Internal Medicine

## 2015-02-06 DIAGNOSIS — N6310 Unspecified lump in the right breast, unspecified quadrant: Secondary | ICD-10-CM

## 2015-03-10 ENCOUNTER — Ambulatory Visit
Admission: RE | Admit: 2015-03-10 | Discharge: 2015-03-10 | Disposition: A | Discharge status: Home / Self Care | Payer: BC Managed Care – PPO | Source: Ambulatory Visit | Attending: Internal Medicine | Admitting: Internal Medicine

## 2015-03-10 DIAGNOSIS — N6310 Unspecified lump in the right breast, unspecified quadrant: Secondary | ICD-10-CM

## 2015-03-11 ENCOUNTER — Encounter: Payer: BC Managed Care – PPO | Admitting: Gynecology

## 2015-03-11 ENCOUNTER — Encounter: Payer: Self-pay | Admitting: Internal Medicine

## 2015-03-11 ENCOUNTER — Other Ambulatory Visit (INDEPENDENT_AMBULATORY_CARE_PROVIDER_SITE_OTHER): Payer: BC Managed Care – PPO

## 2015-03-11 ENCOUNTER — Ambulatory Visit (INDEPENDENT_AMBULATORY_CARE_PROVIDER_SITE_OTHER): Payer: BC Managed Care – PPO | Admitting: Internal Medicine

## 2015-03-11 ENCOUNTER — Other Ambulatory Visit: Payer: Self-pay | Admitting: Internal Medicine

## 2015-03-11 VITALS — BP 126/84 | HR 89 | Temp 98.7°F | Ht 62.0 in | Wt 261.0 lb

## 2015-03-11 DIAGNOSIS — E119 Type 2 diabetes mellitus without complications: Secondary | ICD-10-CM | POA: Diagnosis not present

## 2015-03-11 DIAGNOSIS — Z0000 Encounter for general adult medical examination without abnormal findings: Secondary | ICD-10-CM

## 2015-03-11 LAB — MICROALBUMIN / CREATININE URINE RATIO
Creatinine,U: 252.2 mg/dL
Microalb Creat Ratio: 0.4 mg/g (ref 0.0–30.0)
Microalb, Ur: 0.9 mg/dL (ref 0.0–1.9)

## 2015-03-11 LAB — LIPID PANEL
Cholesterol: 219 mg/dL — ABNORMAL HIGH (ref 0–200)
HDL: 50.3 mg/dL (ref >39.00)
LDL Cholesterol: 149 mg/dL — ABNORMAL HIGH (ref 0–99)
NonHDL: 169.06
Total CHOL/HDL Ratio: 4
Triglycerides: 101 mg/dL (ref 0.0–149.0)
VLDL: 20.2 mg/dL (ref 0.0–40.0)

## 2015-03-11 LAB — CBC WITH DIFFERENTIAL/PLATELET
Basophils Absolute: 0 10*3/uL (ref 0.0–0.1)
Basophils Relative: 0.4 % (ref 0.0–3.0)
Eosinophils Absolute: 0 10*3/uL (ref 0.0–0.7)
Eosinophils Relative: 0.6 % (ref 0.0–5.0)
HCT: 40.5 % (ref 36.0–46.0)
Hemoglobin: 13.3 g/dL (ref 12.0–15.0)
Lymphocytes Relative: 43.6 % (ref 12.0–46.0)
Lymphs Abs: 2.2 10*3/uL (ref 0.7–4.0)
MCHC: 32.8 g/dL (ref 30.0–36.0)
MCV: 84 fl (ref 78.0–100.0)
Monocytes Absolute: 0.4 10*3/uL (ref 0.1–1.0)
Monocytes Relative: 7.8 % (ref 3.0–12.0)
Neutro Abs: 2.5 10*3/uL (ref 1.4–7.7)
Neutrophils Relative %: 47.6 % (ref 43.0–77.0)
Platelets: 286 10*3/uL (ref 150.0–400.0)
RBC: 4.82 Mil/uL (ref 3.87–5.11)
RDW: 15.4 % (ref 11.5–15.5)
WBC: 5.2 10*3/uL (ref 4.0–10.5)

## 2015-03-11 LAB — BASIC METABOLIC PANEL
BUN: 11 mg/dL (ref 6–23)
CO2: 29 mEq/L (ref 19–32)
Calcium: 9.6 mg/dL (ref 8.4–10.5)
Chloride: 101 mEq/L (ref 96–112)
Creatinine, Ser: 0.71 mg/dL (ref 0.40–1.20)
GFR: 113.87 mL/min (ref >60.00)
Glucose, Bld: 137 mg/dL — ABNORMAL HIGH (ref 70–99)
Potassium: 4 mEq/L (ref 3.5–5.1)
Sodium: 137 mEq/L (ref 135–145)

## 2015-03-11 LAB — HEPATIC FUNCTION PANEL
ALT: 18 U/L (ref 0–35)
AST: 17 U/L (ref 0–37)
Albumin: 4.2 g/dL (ref 3.5–5.2)
Alkaline Phosphatase: 119 U/L — ABNORMAL HIGH (ref 39–117)
Bilirubin, Direct: 0.1 mg/dL (ref 0.0–0.3)
Total Bilirubin: 0.3 mg/dL (ref 0.2–1.2)
Total Protein: 7.8 g/dL (ref 6.0–8.3)

## 2015-03-11 LAB — URINALYSIS, ROUTINE W REFLEX MICROSCOPIC
Bilirubin Urine: NEGATIVE
Hgb urine dipstick: NEGATIVE
Ketones, ur: NEGATIVE
Leukocytes, UA: NEGATIVE
Nitrite: NEGATIVE
Specific Gravity, Urine: 1.025 (ref 1.000–1.030)
Total Protein, Urine: NEGATIVE
Urine Glucose: NEGATIVE
Urobilinogen, UA: 0.2 (ref 0.0–1.0)
pH: 6 (ref 5.0–8.0)

## 2015-03-11 LAB — HEMOGLOBIN A1C: Hgb A1c MFr Bld: 7.5 % — ABNORMAL HIGH (ref 4.6–6.5)

## 2015-03-11 LAB — TSH: TSH: 2.85 u[IU]/mL (ref 0.35–4.50)

## 2015-03-11 MED ORDER — METFORMIN HCL ER 500 MG PO TB24: 500.0000 mg | ORAL_TABLET | Freq: Every day | ORAL | Status: DC

## 2015-03-11 MED ORDER — ATORVASTATIN CALCIUM 10 MG PO TABS: 10.0000 mg | ORAL_TABLET | Freq: Every day | ORAL | Status: DC

## 2015-03-11 MED ORDER — GLIPIZIDE ER 2.5 MG PO TB24: 2.5000 mg | ORAL_TABLET | Freq: Every day | ORAL | Status: DC

## 2015-03-11 NOTE — Patient Instructions (Signed)
OK to change the metformin to the ER 500 mg per day  We can stay off the Lipitor as discussed  Please continue all other medications as before, and refills have been done if requested.  Please have the pharmacy call with any other refills you may need.  Please continue your efforts at being more active, low cholesterol diet, and weight control.  You are otherwise up to date with prevention measures today.  Please keep your appointments with your specialists as you may have planned  Please go to the LAB in the Basement (turn left off the elevator) for the tests to be done today  You will be contacted by phone if any changes need to be made immediately.  Otherwise, you will receive a letter about your results with an explanation, but please check with MyChart first.  Please remember to sign up for MyChart if you have not done so, as this will be important to you in the future with finding out test results, communicating by private email, and scheduling acute appointments online when needed.  Please return in 6 months, or sooner if needed, with Lab testing done 3-5 days before

## 2015-03-11 NOTE — Progress Notes (Signed)
Pre visit review using our clinic review tool, if applicable. No additional management support is needed unless otherwise documented below in the visit note. 

## 2015-03-11 NOTE — Assessment & Plan Note (Signed)
stable overall by history and exam, recent data reviewed with pt, and pt to continue medical treatment as before except for metformin to stay at ER 500 qd due to diarrhea, may need further OHA with check a1c today,  to f/u any worsening symptoms or concerns Lab Results  Component Value Date   HGBA1C 7.6* 02/04/2014

## 2015-03-11 NOTE — Assessment & Plan Note (Signed)

## 2015-03-11 NOTE — Progress Notes (Signed)
Subjective:    Patient ID: Kelly Rowe, female    DOB: [birth year], 44 y.o.   MRN: [id number]  HPI  /Here for wellness and f/u;  Overall doing ok;  Pt denies Chest pain, worsening SOB, DOE, wheezing, orthopnea, PND, worsening LE edema, palpitations, dizziness or syncope.  Pt denies neurological change such as new headache, facial or extremity weakness.  Pt denies polydipsia, polyuria, or low sugar symptoms. Pt states overall good compliance with treatment and medications, good tolerability, and has been trying to follow appropriate diet.  Pt denies worsening depressive symptoms, suicidal ideation or panic. No fever, night sweats, wt loss, loss of appetite, or other constitutional symptoms.  Pt states good ability with ADL's, has low fall risk, home safety reviewed and adequate, no other significant changes in hearing or vision, and only occasionally active with exercise.  No current complaints  Only taking metformin 500 mg in the am due to diarrhea with higher dosing, and not taking the liptor as per ID at [facility] due to drug interaction potential.  Wt has been improved over the past yr Wt Readings from Last 3 Encounters:  03/11/15 261 lb (118.389 kg)  09/21/14 267 lb (121.11 kg)  01/31/14 267 lb (121.11 kg)   Past Medical History  Diagnosis Date  . ANEMIA-IRON DEFICIENCY 01/30/2010  . ANXIETY 11/27/2007  . Carbuncle and furuncle of trunk 04/16/2010  . DIABETES MELLITUS, TYPE II 08/02/2007  . Edema 01/30/2010  . ELEVATED BLOOD PRESSURE WITHOUT DIAGNOSIS OF HYPERTENSION 08/02/2007  . FREQUENCY, URINARY 05/19/2009  . GENITAL HERPES 03/12/2009  . HIV INFECTION 03/12/2009  . HYPERLIPIDEMIA 08/02/2007  . Metrorrhagia 03/21/2008  . RETENTION, URINE 05/19/2009  . VITAMIN D DEFICIENCY 01/30/2010  . Chlamydia infection 03/21/2008  . HSV (herpes simplex virus) infection   . Trichomonas infection 01/19/2010  . Asthma 02/25/2011  . Arthritis     both knees  . HIV (human immunodeficiency virus infection)  2009   Past Surgical History  Procedure Laterality Date  . Cholecystectomy    . Tubal ligation    . Endometrial ablation  01/11/2008    HER OPTION  . Abdominal hysterectomy  07/22/2010    TAH WITH PRESERVATION OF BOTH TUBES AND OVARIES    reports that she has never smoked. She has never used smokeless tobacco. She reports that she drinks alcohol. She reports that she does not use illicit drugs. family history includes Breast cancer in her paternal aunt; Breast cancer (age of onset: 37) in her maternal uncle; Cancer in her father; Diabetes in her father and mother; Heart disease in her other; Hypertension in her mother; Prostate cancer in her other; Stroke in her other and paternal uncle. Allergies  Allergen Reactions  . Levaquin [Levofloxacin In D5w] Nausea And Vomiting  . Penicillins     REACTION: rash   Current Outpatient Prescriptions on File Prior to Visit  Medication Sig Dispense Refill  . acetaminophen (TYLENOL) 500 MG tablet Take 500 mg by mouth as needed.      Marland Kitchen albuterol (PROVENTIL HFA) 108 (90 BASE) MCG/ACT inhaler Inhale 2 puffs into the lungs every 6 (six) hours as needed for wheezing. 3 Inhaler 5  . ALPRAZolam (XANAX) 0.25 MG tablet Take 0.25 mg by mouth 3 (three) times daily as needed.      Marland Kitchen aspirin 81 MG tablet Take 81 mg by mouth daily.      Marland Kitchen atazanavir (REYATAZ) 300 MG capsule Take 300 mg by mouth daily with breakfast.      .  Cholecalciferol (VITAMIN D3) 1000 UNITS CAPS Take by mouth daily.      . cyclobenzaprine (FLEXERIL) 10 MG tablet Take 1 tablet (10 mg total) by mouth 2 (two) times daily as needed for muscle spasms. 20 tablet 0  . ferrous fumarate (HEMOCYTE - 106 MG FE) 325 (106 FE) MG TABS Take 1 tablet by mouth.    . fluticasone (FLONASE) 50 MCG/ACT nasal spray Place 2 sprays into both nostrils daily. 16 g 1  . Fluticasone-Salmeterol (ADVAIR DISKUS) 250-50 MCG/DOSE AEPB Inhale 1 puff into the lungs 2 (two) times daily. 1 each 11  . glucose blood (ACCU-CHEK  SMARTVIEW) test strip Use as directed once daily to check blood sugar.  Diagnosis code 250.00 100 each 11  . Hydrocod Polst-Chlorphen Polst 10-8 MG CP12 Take 1 tablet by mouth 2 (two) times daily as needed. 25 each 1   No current facility-administered medications on file prior to visit.      Review of Systems Constitutional: Negative for increased diaphoresis, other activity, appetite or siginficant weight change other than noted HENT: Negative for worsening hearing loss, ear pain, facial swelling, mouth sores and neck stiffness.   Eyes: Negative for other worsening pain, redness or visual disturbance.  Respiratory: Negative for shortness of breath and wheezing  Cardiovascular: Negative for chest pain and palpitations.  Gastrointestinal: Negative for diarrhea, blood in stool, abdominal distention or other pain Genitourinary: Negative for hematuria, flank pain or change in urine volume.  Musculoskeletal: Negative for myalgias or other joint complaints.  Skin: Negative for color change and wound or drainage.  Neurological: Negative for syncope and numbness. other than noted Hematological: Negative for adenopathy. or other swelling Psychiatric/Behavioral: Negative for hallucinations, SI, self-injury, decreased concentration or other worsening agitation.      Objective:   Physical Exam BP 126/84 mmHg  Pulse 89  Temp(Src) 98.7 F (37.1 C) (Oral)  Ht 5\' 2"  (1.575 m)  Wt 261 lb (118.389 kg)  BMI 47.73 kg/m2  SpO2 96%  LMP 06/21/2010 VS noted,  Constitutional: Pt is oriented to person, place, and time. Appears well-developed and well-nourished, in no significant distress Head: Normocephalic and atraumatic.  Right Ear: External ear normal.  Left Ear: External ear normal.  Nose: Nose normal.  Mouth/Throat: Oropharynx is clear and moist.  Eyes: Conjunctivae and EOM are normal. Pupils are equal, round, and reactive to light.  Neck: Normal range of motion. Neck supple. No JVD present. No  tracheal deviation present or significant neck LA or mass Cardiovascular: Normal rate, regular rhythm, normal heart sounds and intact distal pulses.   Pulmonary/Chest: Effort normal and breath sounds without rales or wheezing  Abdominal: Soft. Bowel sounds are normal. NT. No HSM  Musculoskeletal: Normal range of motion. Exhibits no edema.  Lymphadenopathy:  Has no cervical adenopathy.  Neurological: Pt is alert and oriented to person, place, and time. Pt has normal reflexes. No cranial nerve deficit. Motor grossly intact Skin: Skin is warm and dry. No rash noted.  Psychiatric:  Has normal mood and affect. Behavior is normal.     Assessment & Plan:

## 2015-03-13 ENCOUNTER — Telehealth: Payer: Self-pay | Admitting: Internal Medicine

## 2015-03-13 MED ORDER — TRIAMCINOLONE ACETONIDE 0.1 % EX CREA: 1.0000 "application " | TOPICAL_CREAM | Freq: Two times a day (BID) | CUTANEOUS | Status: DC

## 2015-03-13 NOTE — Telephone Encounter (Signed)
rx done erx 

## 2015-03-13 NOTE — Telephone Encounter (Signed)
-----   Message from Lyman Bishop, [place] sent at 03/12/2015  6:48 PM EDT ----- Pt advised and requests an Rx to treat skin inflammation discussed at visit

## 2015-03-27 ENCOUNTER — Other Ambulatory Visit (HOSPITAL_COMMUNITY)
Admission: RE | Admit: 2015-03-27 | Discharge: 2015-03-27 | Disposition: A | Discharge status: Home / Self Care | Payer: BC Managed Care – PPO | Source: Ambulatory Visit | Attending: Gynecology | Admitting: Gynecology

## 2015-03-27 ENCOUNTER — Encounter: Payer: Self-pay | Admitting: Gynecology

## 2015-03-27 ENCOUNTER — Ambulatory Visit (INDEPENDENT_AMBULATORY_CARE_PROVIDER_SITE_OTHER): Payer: BC Managed Care – PPO | Admitting: Gynecology

## 2015-03-27 VITALS — BP 126/80 | Ht 62.0 in | Wt 256.0 lb

## 2015-03-27 DIAGNOSIS — Z8619 Personal history of other infectious and parasitic diseases: Secondary | ICD-10-CM

## 2015-03-27 DIAGNOSIS — G4700 Insomnia, unspecified: Secondary | ICD-10-CM | POA: Diagnosis not present

## 2015-03-27 DIAGNOSIS — Z01419 Encounter for gynecological examination (general) (routine) without abnormal findings: Secondary | ICD-10-CM | POA: Insufficient documentation

## 2015-03-27 DIAGNOSIS — Z113 Encounter for screening for infections with a predominantly sexual mode of transmission: Secondary | ICD-10-CM | POA: Diagnosis not present

## 2015-03-27 DIAGNOSIS — N951 Menopausal and female climacteric states: Secondary | ICD-10-CM | POA: Diagnosis not present

## 2015-03-27 DIAGNOSIS — B20 Human immunodeficiency virus [HIV] disease: Secondary | ICD-10-CM

## 2015-03-27 MED ORDER — ZOLPIDEM TARTRATE 10 MG PO TABS: 10.0000 mg | ORAL_TABLET | Freq: Every evening | ORAL | Status: DC | PRN

## 2015-03-27 NOTE — Patient Instructions (Signed)
Zolpidem extended-release tablets What is this medicine? ZOLPIDEM (zole PI dem) is used to treat insomnia. This medicine helps you to fall asleep and sleep through the night. This medicine may be used for other purposes; ask your health care provider or pharmacist if you have questions. COMMON BRAND NAME(S): Ambien CR What should I tell my health care provider before I take this medicine? They need to know if you have any of these conditions: -depression -history of a drug or alcohol abuse problem -liver disease -lung or breathing disease -suicidal thoughts -an unusual or allergic reaction to zolpidem, other medicines, foods, dyes, or preservatives -pregnant or trying to get pregnant -breast-feeding How should I use this medicine? Take this medicine by mouth with a glass of water. Follow the directions on the prescription label. Do not crush, split, or chew the tablet before swallowing. It is better to take this medicine on an empty stomach and only when you are ready for bed. Do not take your medicine more often than directed. If you have been taking this medicine for several weeks and suddenly stop taking it, you may get unpleasant withdrawal symptoms. Your doctor or health care professional may want to gradually reduce the dose. Do not stop taking this medicine on your own. Always follow your doctor or health care professional's advice. A special MedGuide will be given to you by the pharmacist with each prescription and refill. Be sure to read this information carefully each time. Talk to your pediatrician regarding the use of this medicine in children. Special care may be needed. Overdosage: If you think you have taken too much of this medicine contact a poison control center or emergency room at once. NOTE: This medicine is only for you. Do not share this medicine with others. What if I miss a dose? This does not apply. This medicine should only be taken immediately before going to sleep.  Do not take double or extra doses. What may interact with this medicine? -herbal medicines like kava kava, melatonin, St. John's wort and valerian -medicines for fungal infections like ketoconazole, fluconazole, or itraconazole -medicines for treating depression or other mental problems -other medicines given for sleep -some medicines for Parkinson' s disease or other movement disorders -some medicines used to treat HIV infection or AIDS, like ritonavir This list may not describe all possible interactions. Give your health care provider a list of all the medicines, herbs, non-prescription drugs, or dietary supplements you use. Also tell them if you smoke, drink alcohol, or use illegal drugs. Some items may interact with your medicine. What should I watch for while using this medicine? Visit your doctor or health care professional for regular checks on your progress. Keep a regular sleep schedule by going to bed at about the same time each night. Avoid caffeine-containing drinks in the evening hours. When sleep medicines are used every night for more than a few weeks, they may stop working. Talk to your doctor if your insomnia worsens or is not better within 7 to 10 days. You may not be able to remember things that you do in the hours after you take this medicine. Some people have reported driving, making phone calls, or preparing and eating food while asleep after taking sleep medicine. Take this medicine right before going to sleep. Tell your doctor if you are having any problems with your memory. After you stop taking this medicine, you may have trouble falling asleep. This is called rebound insomnia. This problem usually goes away on its  own after 1 or 2 nights. Do not take this medicine unless you are able to stay in bed for a full night (7 to 8 hours) before you must be active again. Do not drive, use machinery, or do anything that needs mental alertness the day after you take this medicine. You  may have a decrease in mental alertness the day after use, even if you feel that you are fully awake. Tell your doctor if you will need to perform activities requiring full alertness, such as driving, the next day. You may get drowsy or dizzy. Do not stand or sit up quickly, especially if you are an older patient. This reduces the risk of dizzy or fainting spells. Alcohol may interfere with the effect of this medicine. Avoid alcoholic drinks. If you or your family notice any changes in your behavior, or if you have any unusual or disturbing thoughts, call your doctor right away. What side effects may I notice from receiving this medicine? Side effects that you should report to your doctor or health care professional as soon as possible: -allergic reactions like skin rash, itching or hives, swelling of the face, lips, or tongue -changes in vision -confusion -depressed mood -feeling faint or lightheaded, falls -hallucinations -problems with balance, speaking, walking -restlessness, excitability, or feelings of agitation -unusual activities while asleep like driving, eating, making phone calls Side effects that usually do not require medical attention (report to your doctor or health care professional if they continue or are bothersome): -diarrhea -dizziness, or daytime drowsiness, sometimes called a hangover effect -headache This list may not describe all possible side effects. Call your doctor for medical advice about side effects. You may report side effects to FDA at 1-800-FDA-1088. Where should I keep my medicine? Keep out of the reach of children. This medicine can be abused. Keep your medicine in a safe place to protect it from theft. Do not share this medicine with anyone. Selling or giving away this medicine is dangerous and against the law. Store at controlled room temperature between 15 and 25 degrees C (59 and 77 degrees F). Throw away any unused medicine after the expiration  date. NOTE: This sheet is a summary. It may not cover all possible information. If you have questions about this medicine, talk to your doctor, pharmacist, or health care provider.  2015, [employer] Standard. (2011-12-09 12:44:09) Insomnia Insomnia is frequent trouble falling and/or staying asleep. Insomnia can be a long term problem or a short term problem. Both are common. Insomnia can be a short term problem when the wakefulness is related to a certain stress or worry. Long term insomnia is often related to ongoing stress during waking hours and/or poor sleeping habits. Overtime, sleep deprivation itself can make the problem worse. Every little thing feels more severe because you are overtired and your ability to cope is decreased. CAUSES   Stress, anxiety, and depression.  Poor sleeping habits.  Distractions such as TV in the bedroom.  Naps close to bedtime.  Engaging in emotionally charged conversations before bed.  Technical reading before sleep.  Alcohol and other sedatives. They may make the problem worse. They can hurt normal sleep patterns and normal dream activity.  Stimulants such as caffeine for several hours prior to bedtime.  Pain syndromes and shortness of breath can cause insomnia.  Exercise late at night.  Changing time zones may cause sleeping problems (jet lag). It is sometimes helpful to have someone observe your sleeping patterns. They should look for periods of  not breathing during the night (sleep apnea). They should also look to see how long those periods last. If you live alone or observers are uncertain, you can also be observed at a sleep clinic where your sleep patterns will be professionally monitored. Sleep apnea requires a checkup and treatment. Give your caregivers your medical history. Give your caregivers observations your family has made about your sleep.  SYMPTOMS   Not feeling rested in the morning.  Anxiety and restlessness at  bedtime.  Difficulty falling and staying asleep. TREATMENT   Your caregiver may prescribe treatment for an underlying medical disorders. Your caregiver can give advice or help if you are using alcohol or other drugs for self-medication. Treatment of underlying problems will usually eliminate insomnia problems.  Medications can be prescribed for short time use. They are generally not recommended for lengthy use.  Over-the-counter sleep medicines are not recommended for lengthy use. They can be habit forming.  You can promote easier sleeping by making lifestyle changes such as:  Using relaxation techniques that help with breathing and reduce muscle tension.  Exercising earlier in the day.  Changing your diet and the time of your last meal. No night time snacks.  Establish a regular time to go to bed.  Counseling can help with stressful problems and worry.  Soothing music and white noise may be helpful if there are background noises you cannot remove.  Stop tedious detailed work at least one hour before bedtime. HOME CARE INSTRUCTIONS   Keep a diary. Inform your caregiver about your progress. This includes any medication side effects. See your caregiver regularly. Take note of:  Times when you are asleep.  Times when you are awake during the night.  The quality of your sleep.  How you feel the next day. This information will help your caregiver care for you.  Get out of bed if you are still awake after 15 minutes. Read or do some quiet activity. Keep the lights down. Wait until you feel sleepy and go back to bed.  Keep regular sleeping and waking hours. Avoid naps.  Exercise regularly.  Avoid distractions at bedtime. Distractions include watching television or engaging in any intense or detailed activity like attempting to balance the household checkbook.  Develop a bedtime ritual. Keep a familiar routine of bathing, brushing your teeth, climbing into bed at the same time  each night, listening to soothing music. Routines increase the success of falling to sleep faster.  Use relaxation techniques. This can be using breathing and muscle tension release routines. It can also include visualizing peaceful scenes. You can also help control troubling or intruding thoughts by keeping your mind occupied with boring or repetitive thoughts like the old concept of counting sheep. You can make it more creative like imagining planting one beautiful flower after another in your backyard garden.  During your day, work to eliminate stress. When this is not possible use some of the previous suggestions to help reduce the anxiety that accompanies stressful situations. MAKE SURE YOU:   Understand these instructions.  Will watch your condition.  Will get help right away if you are not doing well or get worse. Document Released: 07/09/2000 Document Revised: 10/04/2011 Document Reviewed: 08/09/2007 [facility] Patient Information 2015 [place], [place]. This information is not intended to replace advice given to you by your health care provider. Make sure you discuss any questions you have with your health care provider. Hormone Therapy At menopause, your body begins making less estrogen and progesterone hormones.  This causes the body to stop having menstrual periods. This is because estrogen and progesterone hormones control your periods and menstrual cycle. A lack of estrogen may cause symptoms such as:  Hot flushes (or hot flashes).  Vaginal dryness.  Dry skin.  Loss of sex drive.  Risk of bone loss (osteoporosis). When this happens, you may choose to take hormone therapy to get back the estrogen lost during menopause. When the hormone estrogen is given alone, it is usually referred to as ET (Estrogen Therapy). When the hormone progestin is combined with estrogen, it is generally called HT (Hormone Therapy). This was formerly known as hormone replacement therapy (HRT). Your  caregiver can help you make a decision on what will be best for you. The decision to use HT seems to change often as new studies are done. Many studies do not agree on the benefits of hormone replacement therapy. LIKELY BENEFITS OF HT INCLUDE PROTECTION FROM:  Hot Flushes (also called hot flashes) - A hot flush is a sudden feeling of heat that spreads over the face and body. The skin may redden like a blush. It is connected with sweats and sleep disturbance. Women going through menopause may have hot flushes a few times a month or several times per day depending on the woman.  Osteoporosis (bone loss)- Estrogen helps guard against bone loss. After menopause, a woman's bones slowly lose calcium and become weak and brittle. As a result, bones are more likely to break. The hip, wrist, and spine are affected most often. Hormone therapy can help slow bone loss after menopause. Weight bearing exercise and taking calcium with vitamin D also can help prevent bone loss. There are also medications that your caregiver can prescribe that can help prevent osteoporosis.  Vaginal Dryness - Loss of estrogen causes changes in the vagina. Its lining may become thin and dry. These changes can cause pain and bleeding during sexual intercourse. Dryness can also lead to infections. This can cause burning and itching. (Vaginal estrogen treatment can help relieve pain, itching, and dryness.)  Urinary Tract Infections are more common after menopause because of lack of estrogen. Some women also develop urinary incontinence because of low estrogen levels in the vagina and bladder.  Possible other benefits of estrogen include a positive effect on mood and short-term memory in women. RISKS AND COMPLICATIONS  Using estrogen alone without progesterone causes the lining of the uterus to grow. This increases the risk of lining of the uterus (endometrial) cancer. Your caregiver should give another hormone called progestin if you have a  uterus.  Women who take combined (estrogen and progestin) HT appear to have an increased risk of breast cancer. The risk appears to be small, but increases throughout the time that HT is taken.  Combined therapy also makes the breast tissue slightly denser which makes it harder to read mammograms (breast X-rays).  Combined, estrogen and progesterone therapy can be taken together every day, in which case there may be spotting of blood. HT therapy can be taken cyclically in which case you will have menstrual periods. Cyclically means HT is taken for a set amount of days, then not taken, then this process is repeated.  HT may increase the risk of stroke, heart attack, breast cancer and forming blood clots in your leg.  Transdermal estrogen (estrogen that is absorbed through the skin with a patch or a cream) may have more positive results with:  Cholesterol.  Blood pressure.  Blood clots. Having the following conditions  may indicate you should not have HT:  Endometrial cancer.  Liver disease.  Breast cancer.  Heart disease.  History of blood clots.  Stroke. TREATMENT   If you choose to take HT and have a uterus, usually estrogen and progestin are prescribed.  Your caregiver will help you decide the best way to take the medications.  Possible ways to take estrogen include:  Pills.  Patches.  Gels.  Sprays.  Vaginal estrogen cream, rings and tablets.  It is best to take the lowest dose possible that will help your symptoms and take them for the shortest period of time that you can.  Hormone therapy can help relieve some of the problems (symptoms) that affect women at menopause. Before making a decision about HT, talk to your caregiver about what is best for you. Be well informed and comfortable with your decisions. HOME CARE INSTRUCTIONS   Follow your caregivers advice when taking the medications.  A Pap test is done to screen for cervical cancer.  The first Pap  test should be done at age 52.  Between ages 27 and 67, Pap tests are repeated every 2 years.  Beginning at age 27, you are advised to have a Pap test every 3 years as long as your past 3 Pap tests have been normal.  Some women have medical problems that increase the chance of getting cervical cancer. Talk to your caregiver about these problems. It is especially important to talk to your caregiver if a new problem develops soon after your last Pap test. In these cases, your caregiver may recommend more frequent screening and Pap tests.  The above recommendations are the same for women who have or have not gotten the vaccine for HPV (Human Papillomavirus).  If you had a hysterectomy for a problem that was not a cancer or a condition that could lead to cancer, then you no longer need Pap tests. However, even if you no longer need a Pap test, a regular exam is a good idea to make sure no other problems are starting.   If you are between ages 28 and 38, and you have had normal Pap tests going back 10 years, you no longer need Pap tests. However, even if you no longer need a Pap test, a regular exam is a good idea to make sure no other problems are starting.   If you have had past treatment for cervical cancer or a condition that could lead to cancer, you need Pap tests and screening for cancer for at least 20 years after your treatment.  If Pap tests have been discontinued, risk factors (such as a new sexual partner) need to be re-assessed to determine if screening should be resumed.  Some women may need screenings more often if they are at high risk for cervical cancer.  Get mammograms done as per the advice of your caregiver. SEEK IMMEDIATE MEDICAL CARE IF:  You develop abnormal vaginal bleeding.  You have pain or swelling in your legs, shortness of breath, or chest pain.  You develop dizziness or headaches.  You have lumps or changes in your breasts or armpits.  You have slurred  speech.  You develop weakness or numbness of your arms or legs.  You have pain, burning, or bleeding when urinating.  You develop abdominal pain. Document Released: 04/10/2003 Document Revised: 10/04/2011 Document Reviewed: 07/29/2010 [facility] Patient Information 2015 [place], [place]. This information is not intended to replace advice given to you by your health care provider. Make  sure you discuss any questions you have with your health care provider. Perimenopause Perimenopause is the time when your body begins to move into the menopause (no menstrual period for 12 straight months). It is a natural process. Perimenopause can begin 2-8 years before the menopause and usually lasts for 1 year after the menopause. During this time, your ovaries may or may not produce an egg. The ovaries vary in their production of estrogen and progesterone hormones each month. This can cause irregular menstrual periods, difficulty getting pregnant, vaginal bleeding between periods, and uncomfortable symptoms. CAUSES  Irregular production of the ovarian hormones, estrogen and progesterone, and not ovulating every month.  Other causes include:  Tumor of the pituitary gland in the brain.  Medical disease that affects the ovaries.  Radiation treatment.  Chemotherapy.  Unknown causes.  Heavy smoking and excessive alcohol intake can bring on perimenopause sooner. SIGNS AND SYMPTOMS   Hot flashes.  Night sweats.  Irregular menstrual periods.  Decreased sex drive.  Vaginal dryness.  Headaches.  Mood swings.  Depression.  Memory problems.  Irritability.  Tiredness.  Weight gain.  Trouble getting pregnant.  The beginning of losing bone cells (osteoporosis).  The beginning of hardening of the arteries (atherosclerosis). DIAGNOSIS  Your health care provider will make a diagnosis by analyzing your age, menstrual history, and symptoms. He or she will do a physical exam and note any changes  in your body, especially your female organs. Female hormone tests may or may not be helpful depending on the amount of female hormones you produce and when you produce them. However, other hormone tests may be helpful to rule out other problems. TREATMENT  In some cases, no treatment is needed. The decision on whether treatment is necessary during the perimenopause should be made by you and your health care provider based on how the symptoms are affecting you and your lifestyle. Various treatments are available, such as:  Treating individual symptoms with a specific medicine for that symptom.  Herbal medicines that can help specific symptoms.  Counseling.  Group therapy. HOME CARE INSTRUCTIONS   Keep track of your menstrual periods (when they occur, how heavy they are, how long between periods, and how long they last) as well as your symptoms and when they started.  Only take over-the-counter or prescription medicines as directed by your health care provider.  Sleep and rest.  Exercise.  Eat a diet that contains calcium (good for your bones) and soy (acts like the estrogen hormone).  Do not smoke.  Avoid alcoholic beverages.  Take vitamin supplements as recommended by your health care provider. Taking vitamin E may help in certain cases.  Take calcium and vitamin D supplements to help prevent bone loss.  Group therapy is sometimes helpful.  Acupuncture may help in some cases. SEEK MEDICAL CARE IF:   You have questions about any symptoms you are having.  You need a referral to a specialist (gynecologist, psychiatrist, or psychologist). SEEK IMMEDIATE MEDICAL CARE IF:   You have vaginal bleeding.  Your period lasts longer than 8 days.  Your periods are recurring sooner than 21 days.  You have bleeding after intercourse.  You have severe depression.  You have pain when you urinate.  You have severe headaches.  You have vision problems. Document Released:  08/19/2004 Document Revised: 05/02/2013 Document Reviewed: 02/08/2013 [facility] Patient Information 2015 [place], [place]. This information is not intended to replace advice given to you by your health care provider. Make sure you discuss any  questions you have with your health care provider.  

## 2015-03-27 NOTE — Progress Notes (Signed)
Kelly Rowe Jul 03, 1971 [id number]   History:    44 y.o.  for annual gyn exam complaining of hot flashes, and mood swing. Patient also complaining of insomnia.Patient had a total abdominal hysterectomy in December 2011 for leiomyomata uteri after same institution. Dr. Jenny Reichmann is her primary physician who has been treating her for type 2 diabetes as well as for hypertension and he has been doing her blood work. Patient has had a new sexual partner over the past year. Patient's past STD history as follows:  Chlamydia infection 2009  HIV infection 2009  Trichomoniasis 2011  HSV  GC 2008  The patient has a history of HIV and has been followed by her physician at [facility] whereby she goes every 3 months and has or counts done.  Past medical history,surgical history, family history and social history were all reviewed and documented in the EPIC chart.  Gynecologic History Patient's last menstrual period was 06/21/2010. Contraception: status post hysterectomy Last Pap: 2015. Results were: normal Last mammogram: 2016. Results were: normal  Obstetric History OB History  Gravida Para Term Preterm AB SAB TAB Ectopic Multiple Living  7 5 5  2 2    5     # Outcome Date GA Lbr Len/2nd Weight Sex Delivery Anes PTL Lv  7 SAB           6 SAB           5 Term     M Vag-Spont  N Y  4 Term     M Vag-Spont  N Y  3 Term     M Vag-Spont  N Y  2 Term     F Vag-Spont  N Y  1 Term     M Vag-Spont  N Y       ROS: A ROS was performed and pertinent positives and negatives are included in the history.  GENERAL: No fevers or chills. HEENT: No change in vision, no earache, sore throat or sinus congestion. NECK: No pain or stiffness. CARDIOVASCULAR: No chest pain or pressure. No palpitations. PULMONARY: No shortness of breath, cough or wheeze. GASTROINTESTINAL: No abdominal pain, nausea, vomiting or diarrhea, melena or bright red blood per rectum. GENITOURINARY: No urinary frequency, urgency,  hesitancy or dysuria. MUSCULOSKELETAL: No joint or muscle pain, no back pain, no recent trauma. DERMATOLOGIC: No rash, no itching, no lesions. ENDOCRINE: No polyuria, polydipsia, no heat or cold intolerance. No recent change in weight. HEMATOLOGICAL: No anemia or easy bruising or bleeding. NEUROLOGIC: No headache, seizures, numbness, tingling or weakness. PSYCHIATRIC: No depression, no loss of interest in normal activity or change in sleep pattern.     Exam: chaperone present  BP 126/80 mmHg  Ht 5\' 2"  (1.575 m)  Wt 256 lb (116.121 kg)  BMI 46.81 kg/m2  LMP 06/21/2010  Body mass index is 46.81 kg/(m^2).  General appearance : Well developed well nourished female. No acute distress HEENT: Eyes: no retinal hemorrhage or exudates,  Neck supple, trachea midline, no carotid bruits, no thyroidmegaly Lungs: Clear to auscultation, no rhonchi or wheezes, or rib retractions  Heart: Regular rate and rhythm, no murmurs or gallops Breast:Examined in sitting and supine position were symmetrical in appearance, no palpable masses or tenderness,  no skin retraction, no nipple inversion, no nipple discharge, no skin discoloration, no axillary or supraclavicular lymphadenopathy Abdomen: no palpable masses or tenderness, no rebound or guarding Extremities: no edema or skin discoloration or tenderness  Pelvic:  Bartholin, Urethra, Skene Glands: Within  normal limits             Vagina: No gross lesions or discharge  Cervix: Absent  Uterus  absent  Adnexa  Without masses or tenderness  Anus and perineum  normal   Rectovaginal  normal sphincter tone without palpated masses or tenderness             Hemoccult not indicated     Assessment/Plan:  44 y.o. female for annual exam who appears to be perimenopausal based on her symptoms. We'll going to check an [facility] today. Literature information on the perimenopause as well as on hormone replacement therapy was provided. Pap smear along with GC and Chlamydia culture  as part of STD screening was done today. PCP has been doing her blood work. For insomnia she was prescribed Ambien 10 mg to take 1 by mouth daily at bedtime when necessary.   Terrance Mass MD, 12:49 PM 03/27/2015

## 2015-03-28 LAB — GC/CHLAMYDIA PROBE AMP
CT Probe RNA: NEGATIVE
GC Probe RNA: NEGATIVE

## 2015-03-28 LAB — FOLLICLE STIMULATING HORMONE: FSH: 16.9 m[IU]/mL

## 2015-04-01 LAB — CYTOLOGY - PAP

## 2015-09-12 ENCOUNTER — Ambulatory Visit: Payer: Self-pay | Admitting: Internal Medicine

## 2015-09-18 ENCOUNTER — Other Ambulatory Visit (INDEPENDENT_AMBULATORY_CARE_PROVIDER_SITE_OTHER): Payer: BC Managed Care – PPO

## 2015-09-18 ENCOUNTER — Encounter: Payer: Self-pay | Admitting: Internal Medicine

## 2015-09-18 ENCOUNTER — Other Ambulatory Visit: Payer: Self-pay | Admitting: Internal Medicine

## 2015-09-18 ENCOUNTER — Ambulatory Visit (INDEPENDENT_AMBULATORY_CARE_PROVIDER_SITE_OTHER): Payer: BC Managed Care – PPO | Admitting: Internal Medicine

## 2015-09-18 VITALS — BP 136/82 | HR 88 | Temp 98.6°F | Resp 20 | Wt 260.0 lb

## 2015-09-18 DIAGNOSIS — M79644 Pain in right finger(s): Secondary | ICD-10-CM | POA: Insufficient documentation

## 2015-09-18 DIAGNOSIS — Z0000 Encounter for general adult medical examination without abnormal findings: Secondary | ICD-10-CM | POA: Diagnosis not present

## 2015-09-18 DIAGNOSIS — E119 Type 2 diabetes mellitus without complications: Secondary | ICD-10-CM

## 2015-09-18 DIAGNOSIS — E785 Hyperlipidemia, unspecified: Secondary | ICD-10-CM

## 2015-09-18 LAB — URINALYSIS, ROUTINE W REFLEX MICROSCOPIC
Bilirubin Urine: NEGATIVE
Hgb urine dipstick: NEGATIVE
Ketones, ur: NEGATIVE
Leukocytes, UA: NEGATIVE
Nitrite: NEGATIVE
Specific Gravity, Urine: 1.025 (ref 1.000–1.030)
Total Protein, Urine: NEGATIVE
Urine Glucose: NEGATIVE
Urobilinogen, UA: 0.2 (ref 0.0–1.0)
pH: 6 (ref 5.0–8.0)

## 2015-09-18 LAB — CBC WITH DIFFERENTIAL/PLATELET
Basophils Absolute: 0.1 10*3/uL (ref 0.0–0.1)
Basophils Relative: 1 % (ref 0.0–3.0)
Eosinophils Absolute: 0 10*3/uL (ref 0.0–0.7)
Eosinophils Relative: 0.5 % (ref 0.0–5.0)
HCT: 39.3 % (ref 36.0–46.0)
Hemoglobin: 13.1 g/dL (ref 12.0–15.0)
Lymphocytes Relative: 46.9 % — ABNORMAL HIGH (ref 12.0–46.0)
Lymphs Abs: 2.5 10*3/uL (ref 0.7–4.0)
MCHC: 33.3 g/dL (ref 30.0–36.0)
MCV: 83.1 fl (ref 78.0–100.0)
Monocytes Absolute: 0.3 10*3/uL (ref 0.1–1.0)
Monocytes Relative: 5.9 % (ref 3.0–12.0)
Neutro Abs: 2.4 10*3/uL (ref 1.4–7.7)
Neutrophils Relative %: 45.7 % (ref 43.0–77.0)
Platelets: 307 10*3/uL (ref 150.0–400.0)
RBC: 4.74 Mil/uL (ref 3.87–5.11)
RDW: 14.4 % (ref 11.5–15.5)
WBC: 5.4 10*3/uL (ref 4.0–10.5)

## 2015-09-18 LAB — BASIC METABOLIC PANEL
BUN: 7 mg/dL (ref 6–23)
CO2: 29 mEq/L (ref 19–32)
Calcium: 9.4 mg/dL (ref 8.4–10.5)
Chloride: 99 mEq/L (ref 96–112)
Creatinine, Ser: 0.69 mg/dL (ref 0.40–1.20)
GFR: 117.42 mL/min (ref >60.00)
Glucose, Bld: 133 mg/dL — ABNORMAL HIGH (ref 70–99)
Potassium: 3.8 mEq/L (ref 3.5–5.1)
Sodium: 135 mEq/L (ref 135–145)

## 2015-09-18 LAB — HEPATIC FUNCTION PANEL
ALT: 16 U/L (ref 0–35)
AST: 16 U/L (ref 0–37)
Albumin: 4.3 g/dL (ref 3.5–5.2)
Alkaline Phosphatase: 128 U/L — ABNORMAL HIGH (ref 39–117)
Bilirubin, Direct: 0.1 mg/dL (ref 0.0–0.3)
Total Bilirubin: 0.4 mg/dL (ref 0.2–1.2)
Total Protein: 8.1 g/dL (ref 6.0–8.3)

## 2015-09-18 LAB — LIPID PANEL
Cholesterol: 211 mg/dL — ABNORMAL HIGH (ref 0–200)
HDL: 46.7 mg/dL (ref >39.00)
LDL Cholesterol: 149 mg/dL — ABNORMAL HIGH (ref 0–99)
NonHDL: 164.54
Total CHOL/HDL Ratio: 5
Triglycerides: 76 mg/dL (ref 0.0–149.0)
VLDL: 15.2 mg/dL (ref 0.0–40.0)

## 2015-09-18 LAB — HEMOGLOBIN A1C: Hgb A1c MFr Bld: 7.8 % — ABNORMAL HIGH (ref 4.6–6.5)

## 2015-09-18 LAB — TSH: TSH: 2.88 u[IU]/mL (ref 0.35–4.50)

## 2015-09-18 MED ORDER — GLIPIZIDE ER 5 MG PO TB24: 5.0000 mg | ORAL_TABLET | Freq: Every day | ORAL | Status: DC

## 2015-09-18 MED ORDER — ROSUVASTATIN CALCIUM 20 MG PO TABS: 20.0000 mg | ORAL_TABLET | Freq: Every day | ORAL | Status: DC

## 2015-09-18 NOTE — Assessment & Plan Note (Signed)
stable overall by history and exam, recent data reviewed with pt, and pt to continue medical treatment as before,  to f/u any worsening symptoms or concerns Lab Results  Component Value Date   HGBA1C 7.5* 03/11/2015   Hopefully with better a1c this time with glipizide er 2.5 after last visit,  to f/u any worsening symptoms or concerns

## 2015-09-18 NOTE — Progress Notes (Signed)
Pre visit review using our clinic review tool, if applicable. No additional management support is needed unless otherwise documented below in the visit note. 

## 2015-09-18 NOTE — Assessment & Plan Note (Signed)
Right 4th finger DIP c/w mild OA pain; for topical OTC such as capsaicin P,  to f/u any worsening symptoms or concerns

## 2015-09-18 NOTE — Progress Notes (Signed)
Subjective:    Patient ID: Kelly Rowe, female    DOB: 15-Mar-1971, 45 y.o.   MRN: [id number]  HPI    Here for wellness and f/u;  Overall doing ok;  Pt denies Chest pain, worsening SOB, DOE, wheezing, orthopnea, PND, worsening LE edema, palpitations, dizziness or syncope.  Pt denies neurological change such as new headache, facial or extremity weakness.  Pt denies polydipsia, polyuria, or low sugar symptoms. Pt states overall good compliance with treatment and medications, good tolerability, and has been trying to follow appropriate diet.  Pt denies worsening depressive symptoms, suicidal ideation or panic. No fever, night sweats, wt loss, loss of appetite, or other constitutional symptoms.  Pt states good ability with ADL's, has low fall risk, home safety reviewed and adequate, no other significant changes in hearing or vision, and only occasionally active with exercise. Hard to lose wt. Tolerating statin and new glipizide ER well. Wt Readings from Last 3 Encounters:  09/18/15 260 lb (117.935 kg)  03/27/15 256 lb (116.121 kg)  03/11/15 261 lb (118.389 kg)  Does mention pain to PIP of 4th finger right hand with soft tissue swelling, overall mild but has not had this before, no trauma, fever, or hx of gout.   Past Medical History  Diagnosis Date  . ANEMIA-IRON DEFICIENCY 01/30/2010  . ANXIETY 11/27/2007  . Carbuncle and furuncle of trunk 04/16/2010  . DIABETES MELLITUS, TYPE II 08/02/2007  . Edema 01/30/2010  . ELEVATED BLOOD PRESSURE WITHOUT DIAGNOSIS OF HYPERTENSION 08/02/2007  . FREQUENCY, URINARY 05/19/2009  . GENITAL HERPES 03/12/2009  . HIV INFECTION 03/12/2009  . HYPERLIPIDEMIA 08/02/2007  . Metrorrhagia 03/21/2008  . RETENTION, URINE 05/19/2009  . VITAMIN D DEFICIENCY 01/30/2010  . Chlamydia infection 03/21/2008  . HSV (herpes simplex virus) infection   . Trichomonas infection 01/19/2010  . Asthma 02/25/2011  . Arthritis     both knees  . HIV (human immunodeficiency virus infection) ([place])  2009   Past Surgical History  Procedure Laterality Date  . Cholecystectomy    . Tubal ligation    . Endometrial ablation  01/11/2008    HER OPTION  . Abdominal hysterectomy  07/22/2010    TAH WITH PRESERVATION OF BOTH TUBES AND OVARIES    reports that she has never smoked. She has never used smokeless tobacco. She reports that she drinks alcohol. She reports that she does not use illicit drugs. family history includes Breast cancer in her paternal aunt; Breast cancer (age of onset: 38) in her maternal uncle; Cancer in her father; Diabetes in her father and mother; Heart disease in her other; Hypertension in her mother; Prostate cancer in her other; Stroke in her other and paternal uncle. Allergies  Allergen Reactions  . Levaquin [Levofloxacin In D5w] Nausea And Vomiting  . Penicillins     REACTION: rash   Current Outpatient Prescriptions on File Prior to Visit  Medication Sig Dispense Refill  . acetaminophen (TYLENOL) 500 MG tablet Take 500 mg by mouth as needed.      Marland Kitchen albuterol (PROVENTIL HFA) 108 (90 BASE) MCG/ACT inhaler Inhale 2 puffs into the lungs every 6 (six) hours as needed for wheezing. 3 Inhaler 5  . ALPRAZolam (XANAX) 0.25 MG tablet Take 0.25 mg by mouth 3 (three) times daily as needed.      Marland Kitchen aspirin 81 MG tablet Take 81 mg by mouth daily.      Marland Kitchen atazanavir (REYATAZ) 300 MG capsule Take 300 mg by mouth daily with breakfast.      .  atorvastatin (LIPITOR) 10 MG tablet Take 1 tablet (10 mg total) by mouth daily. 90 tablet 3  . Cholecalciferol (VITAMIN D3) 1000 UNITS CAPS Take by mouth daily.      . cyclobenzaprine (FLEXERIL) 10 MG tablet Take 1 tablet (10 mg total) by mouth 2 (two) times daily as needed for muscle spasms. 20 tablet 0  . ferrous fumarate (HEMOCYTE - 106 MG FE) 325 (106 FE) MG TABS Take 1 tablet by mouth.    . fluticasone (FLONASE) 50 MCG/ACT nasal spray Place 2 sprays into both nostrils daily. 16 g 1  . Fluticasone-Salmeterol (ADVAIR DISKUS) 250-50 MCG/DOSE  AEPB Inhale 1 puff into the lungs 2 (two) times daily. 1 each 11  . glipiZIDE (GLIPIZIDE XL) 2.5 MG 24 hr tablet Take 1 tablet (2.5 mg total) by mouth daily with breakfast. 90 tablet 3  . glucose blood (ACCU-CHEK SMARTVIEW) test strip Use as directed once daily to check blood sugar.  Diagnosis code 250.00 100 each 11  . Hydrocod Polst-Chlorphen Polst 10-8 MG CP12 Take 1 tablet by mouth 2 (two) times daily as needed. 25 each 1  . metFORMIN (GLUCOPHAGE-XR) 500 MG 24 hr tablet Take 1 tablet (500 mg total) by mouth daily with breakfast. 90 tablet 3  . triamcinolone cream (KENALOG) 0.1 % Apply 1 application topically 2 (two) times daily. 30 g 1  . zolpidem (AMBIEN) 10 MG tablet Take 1 tablet (10 mg total) by mouth at bedtime as needed for sleep. 30 tablet 2   No current facility-administered medications on file prior to visit.    Review of Systems Constitutional: Negative for increased diaphoresis, other activity, appetite or siginficant weight change other than noted HENT: Negative for worsening hearing loss, ear pain, facial swelling, mouth sores and neck stiffness.   Eyes: Negative for other worsening pain, redness or visual disturbance.  Respiratory: Negative for shortness of breath and wheezing  Cardiovascular: Negative for chest pain and palpitations.  Gastrointestinal: Negative for diarrhea, blood in stool, abdominal distention or other pain Genitourinary: Negative for hematuria, flank pain or change in urine volume.  Musculoskeletal: Negative for myalgias or other joint complaints.  Skin: Negative for color change and wound or drainage.  Neurological: Negative for syncope and numbness. other than noted Hematological: Negative for adenopathy. or other swelling Psychiatric/Behavioral: Negative for hallucinations, SI, self-injury, decreased concentration or other worsening agitation.      Objective:   Physical Exam BP 136/82 mmHg  Pulse 88  Temp(Src) 98.6 F (37 C) (Oral)  Resp 20   Wt 260 lb (117.935 kg)  SpO2 97%  LMP 06/21/2010 VS noted, oibese Constitutional: Pt is oriented to person, place, and time. Appears well-developed and well-nourished, in no significant distress Head: Normocephalic and atraumatic.  Right Ear: External ear normal.  Left Ear: External ear normal.  Nose: Nose normal.  Mouth/Throat: Oropharynx is clear and moist.  Eyes: Conjunctivae and EOM are normal. Pupils are equal, round, and reactive to light.  Neck: Normal range of motion. Neck supple. No JVD present. No tracheal deviation present or significant neck LA or mass Cardiovascular: Normal rate, regular rhythm, normal heart sounds and intact distal pulses.   Pulmonary/Chest: Effort normal and breath sounds without rales or wheezing  Abdominal: Soft. Bowel sounds are normal. NT. No HSM  Musculoskeletal: Normal range of motion. Exhibits no edema.  Lymphadenopathy:  Has no cervical adenopathy.  Neurological: Pt is alert and oriented to person, place, and time. Pt has normal reflexes. No cranial nerve deficit. Motor grossly intact Skin:  Skin is warm and dry. No rash noted.  Psychiatric:  Has normal mood and affect. Behavior is normal.  4th finger right hand DIP with bony OA changes, no effusion, + FROM, mild diffuse tender     Assessment & Plan:

## 2015-09-18 NOTE — Patient Instructions (Addendum)
Please continue all other medications as before, and refills have been done if requested.  Please have the pharmacy call with any other refills you may need.  Please continue your efforts at being more active, low cholesterol diet, and weight control.  You are otherwise up to date with prevention measures today.  Please keep your appointments with your specialists as you may have planned  Please return in 6 months, or sooner if needed, with Lab testing done 3-5 days before  

## 2015-09-18 NOTE — Assessment & Plan Note (Signed)
Uncontrolled last visit, tolerating statin well.  For f/u lab, cont DM lower chol diet, o/w stable overall by history and exam, recent data reviewed with pt, and pt to continue medical treatment as before,  to f/u any worsening symptoms or concerns

## 2015-11-09 ENCOUNTER — Emergency Department (HOSPITAL_BASED_OUTPATIENT_CLINIC_OR_DEPARTMENT_OTHER): Payer: BC Managed Care – PPO

## 2015-11-09 ENCOUNTER — Encounter (HOSPITAL_BASED_OUTPATIENT_CLINIC_OR_DEPARTMENT_OTHER): Payer: Self-pay | Admitting: Emergency Medicine

## 2015-11-09 ENCOUNTER — Emergency Department (HOSPITAL_BASED_OUTPATIENT_CLINIC_OR_DEPARTMENT_OTHER)
Admission: EM | Admit: 2015-11-09 | Discharge: 2015-11-09 | Disposition: A | Discharge status: Home / Self Care | Payer: BC Managed Care – PPO | Attending: Emergency Medicine | Admitting: Emergency Medicine

## 2015-11-09 DIAGNOSIS — E559 Vitamin D deficiency, unspecified: Secondary | ICD-10-CM | POA: Insufficient documentation

## 2015-11-09 DIAGNOSIS — I10 Essential (primary) hypertension: Secondary | ICD-10-CM | POA: Diagnosis not present

## 2015-11-09 DIAGNOSIS — Z79899 Other long term (current) drug therapy: Secondary | ICD-10-CM | POA: Insufficient documentation

## 2015-11-09 DIAGNOSIS — B20 Human immunodeficiency virus [HIV] disease: Secondary | ICD-10-CM | POA: Insufficient documentation

## 2015-11-09 DIAGNOSIS — Z872 Personal history of diseases of the skin and subcutaneous tissue: Secondary | ICD-10-CM | POA: Insufficient documentation

## 2015-11-09 DIAGNOSIS — M1990 Unspecified osteoarthritis, unspecified site: Secondary | ICD-10-CM | POA: Diagnosis not present

## 2015-11-09 DIAGNOSIS — M25562 Pain in left knee: Secondary | ICD-10-CM

## 2015-11-09 DIAGNOSIS — W108XXA Fall (on) (from) other stairs and steps, initial encounter: Secondary | ICD-10-CM | POA: Insufficient documentation

## 2015-11-09 DIAGNOSIS — Z7951 Long term (current) use of inhaled steroids: Secondary | ICD-10-CM | POA: Diagnosis not present

## 2015-11-09 DIAGNOSIS — S80212A Abrasion, left knee, initial encounter: Secondary | ICD-10-CM | POA: Diagnosis not present

## 2015-11-09 DIAGNOSIS — Z7982 Long term (current) use of aspirin: Secondary | ICD-10-CM | POA: Insufficient documentation

## 2015-11-09 DIAGNOSIS — D509 Iron deficiency anemia, unspecified: Secondary | ICD-10-CM | POA: Insufficient documentation

## 2015-11-09 DIAGNOSIS — F419 Anxiety disorder, unspecified: Secondary | ICD-10-CM | POA: Insufficient documentation

## 2015-11-09 DIAGNOSIS — W19XXXA Unspecified fall, initial encounter: Secondary | ICD-10-CM

## 2015-11-09 DIAGNOSIS — E119 Type 2 diabetes mellitus without complications: Secondary | ICD-10-CM | POA: Insufficient documentation

## 2015-11-09 DIAGNOSIS — Y998 Other external cause status: Secondary | ICD-10-CM | POA: Diagnosis not present

## 2015-11-09 DIAGNOSIS — Y9301 Activity, walking, marching and hiking: Secondary | ICD-10-CM | POA: Insufficient documentation

## 2015-11-09 DIAGNOSIS — S50311A Abrasion of right elbow, initial encounter: Secondary | ICD-10-CM | POA: Diagnosis not present

## 2015-11-09 DIAGNOSIS — Z7984 Long term (current) use of oral hypoglycemic drugs: Secondary | ICD-10-CM | POA: Diagnosis not present

## 2015-11-09 DIAGNOSIS — Z8619 Personal history of other infectious and parasitic diseases: Secondary | ICD-10-CM | POA: Diagnosis not present

## 2015-11-09 DIAGNOSIS — S80211A Abrasion, right knee, initial encounter: Secondary | ICD-10-CM | POA: Insufficient documentation

## 2015-11-09 DIAGNOSIS — M25561 Pain in right knee: Secondary | ICD-10-CM

## 2015-11-09 DIAGNOSIS — Y92009 Unspecified place in unspecified non-institutional (private) residence as the place of occurrence of the external cause: Secondary | ICD-10-CM | POA: Insufficient documentation

## 2015-11-09 DIAGNOSIS — Z8742 Personal history of other diseases of the female genital tract: Secondary | ICD-10-CM | POA: Diagnosis not present

## 2015-11-09 DIAGNOSIS — J45909 Unspecified asthma, uncomplicated: Secondary | ICD-10-CM | POA: Insufficient documentation

## 2015-11-09 DIAGNOSIS — Z7952 Long term (current) use of systemic steroids: Secondary | ICD-10-CM | POA: Insufficient documentation

## 2015-11-09 DIAGNOSIS — Z880 Allergy status to penicillin: Secondary | ICD-10-CM | POA: Diagnosis not present

## 2015-11-09 DIAGNOSIS — S8991XA Unspecified injury of right lower leg, initial encounter: Secondary | ICD-10-CM | POA: Diagnosis present

## 2015-11-09 MED ORDER — IBUPROFEN 800 MG PO TABS: 800.0000 mg | ORAL_TABLET | Freq: Three times a day (TID) | ORAL | Status: DC

## 2015-11-09 MED ORDER — IBUPROFEN 400 MG PO TABS
600.0000 mg | ORAL_TABLET | Freq: Once | ORAL | Status: AC | PRN
  Administered 2015-11-09: 600 mg via ORAL
  Filled 2015-11-09: qty 1

## 2015-11-09 NOTE — ED Notes (Signed)
Patient states that she fell down 1 step at home, bilateral knee pain and right elbow abrasion

## 2015-11-09 NOTE — Discharge Instructions (Signed)
Take your medications as prescribed. I recommend eating prior to taking ibuprofen to prevent gastrointestinal side effects. I also recommend resting, elevating and applying ice to bilateral knees for 15-20 minutes 3-4 times daily to help with pain and swelling. Follow-up with your primary care provider in the next 4-5 days as needed if your symptoms have not improved. Please return to the Emergency Department if symptoms worsen or new onset of fever, redness, swelling, warmth, numbness, tingling, weakness.
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
## 2015-11-12 ENCOUNTER — Ambulatory Visit: Payer: Self-pay | Admitting: Internal Medicine

## 2015-11-12 DIAGNOSIS — Z0289 Encounter for other administrative examinations: Secondary | ICD-10-CM

## 2015-11-18 ENCOUNTER — Encounter: Payer: Self-pay | Admitting: Family Medicine

## 2015-11-18 ENCOUNTER — Encounter: Payer: Self-pay | Admitting: Internal Medicine

## 2015-11-18 ENCOUNTER — Ambulatory Visit (INDEPENDENT_AMBULATORY_CARE_PROVIDER_SITE_OTHER): Payer: BC Managed Care – PPO | Admitting: Internal Medicine

## 2015-11-18 ENCOUNTER — Other Ambulatory Visit: Payer: Self-pay

## 2015-11-18 ENCOUNTER — Ambulatory Visit (INDEPENDENT_AMBULATORY_CARE_PROVIDER_SITE_OTHER): Payer: BC Managed Care – PPO | Admitting: Family Medicine

## 2015-11-18 VITALS — BP 140/88 | HR 80 | Temp 98.7°F | Resp 20 | Wt 259.0 lb

## 2015-11-18 VITALS — BP 140/88 | HR 80

## 2015-11-18 DIAGNOSIS — S8012XA Contusion of left lower leg, initial encounter: Secondary | ICD-10-CM

## 2015-11-18 DIAGNOSIS — F411 Generalized anxiety disorder: Secondary | ICD-10-CM

## 2015-11-18 DIAGNOSIS — M79662 Pain in left lower leg: Secondary | ICD-10-CM | POA: Diagnosis not present

## 2015-11-18 DIAGNOSIS — S8002XA Contusion of left knee, initial encounter: Secondary | ICD-10-CM | POA: Insufficient documentation

## 2015-11-18 DIAGNOSIS — E119 Type 2 diabetes mellitus without complications: Secondary | ICD-10-CM | POA: Diagnosis not present

## 2015-11-18 MED ORDER — HYDROCODONE-ACETAMINOPHEN 5-325 MG PO TABS: 1.0000 | ORAL_TABLET | Freq: Four times a day (QID) | ORAL | Status: DC | PRN

## 2015-11-18 MED ORDER — DICLOFENAC SODIUM 2 % TD SOLN: TRANSDERMAL | Status: DC

## 2015-11-18 NOTE — Patient Instructions (Signed)
Good to see you.  Bad leg contusion.  Heat 20 minutes, ice 20 minutes then off 2 times a day  pennsaid pinkie amount topically 2 times daily as needed.  Arnica lotion 2 times daily until bruise is gone.  Vitamin D 2000 IU daily (this is a vitamin) Compression sleeve (tommy copper at [facility] or something like that ) daily for next  2 weeks.  See me again in 2 weeks if not perfect

## 2015-11-18 NOTE — Progress Notes (Signed)
Pre visit review using our clinic review tool, if applicable. No additional management support is needed unless otherwise documented below in the visit note. 

## 2015-11-18 NOTE — Patient Instructions (Signed)
Please take all new medication as prescribed - the pain medication  Please continue all other medications as before, and refills have been done if requested.  Please have the pharmacy call with any other refills you may need.  Please continue your efforts at being more active, low cholesterol diet, and weight control.  Please keep your appointments with your specialists as you may have planned  You have an appt today with Dr Tamala Julian at 245 PM  You are given the work note as well

## 2015-11-18 NOTE — Progress Notes (Signed)
[facility] [address], [place] [address] Phone: [phone] Subjective:    I'm seeing this patient by the request  of:  Cathlean Cower, MD   CC: leg pain, left  [id number] Kelly Rowe is a 45 y.o. female coming in with complaint of leg pain. Almost 2 weeks ago patient did hit the leg going she fell directly on the anterior aspect. Had significant bruising and swelling. Bruising has improved but continues to have the swelling. States that it seems to hurt on the anterior aspect. No pain on the posterior aspect. Rates the severity of pain a 6 out of 10. Is able to walk. Not giving her as much pain when she is not walking. Patient is not taking any medications for it at this time. Denies any numbness or any weakness of the foot. Did not seek medical attention immediately.     Past Medical History  Diagnosis Date  . ANEMIA-IRON DEFICIENCY 01/30/2010  . ANXIETY 11/27/2007  . Carbuncle and furuncle of trunk 04/16/2010  . DIABETES MELLITUS, TYPE II 08/02/2007  . Edema 01/30/2010  . ELEVATED BLOOD PRESSURE WITHOUT DIAGNOSIS OF HYPERTENSION 08/02/2007  . FREQUENCY, URINARY 05/19/2009  . GENITAL HERPES 03/12/2009  . HIV INFECTION 03/12/2009  . HYPERLIPIDEMIA 08/02/2007  . Metrorrhagia 03/21/2008  . RETENTION, URINE 05/19/2009  . VITAMIN D DEFICIENCY 01/30/2010  . Chlamydia infection 03/21/2008  . HSV (herpes simplex virus) infection   . Trichomonas infection 01/19/2010  . Asthma 02/25/2011  . Arthritis     both knees  . HIV (human immunodeficiency virus infection) ([place]) 2009   Past Surgical History  Procedure Laterality Date  . Cholecystectomy    . Tubal ligation    . Endometrial ablation  01/11/2008    HER OPTION  . Abdominal hysterectomy  07/22/2010    TAH WITH PRESERVATION OF BOTH TUBES AND OVARIES   Social History   Social History  . Marital Status: Divorced    Spouse Name: N/A  . Number of Children: N/A  . Years of Education: N/A   Social  History Main Topics  . Smoking status: Never Smoker   . Smokeless tobacco: Never Used  . Alcohol Use: 0.0 oz/week    0 Standard drinks or equivalent per week     Comment: OCCASIONALLY  . Drug Use: No  . Sexual Activity: Yes    Birth Control/ Protection: Surgical     Comment: HAS HAD A HYSTERECTOMY   Other Topics Concern  . Not on file   Social History Narrative   Allergies  Allergen Reactions  . Levaquin [Levofloxacin In D5w] Nausea And Vomiting  . Penicillins     REACTION: rash   Family History  Problem Relation Age of Onset  . Prostate cancer Other   . Heart disease Other   . Stroke Other   . Diabetes Mother   . Hypertension Mother   . Diabetes Father   . Cancer Father     COLON and LU NG  . Stroke Paternal Uncle   . Breast cancer Maternal Uncle 72  . Breast cancer Paternal Aunt     Past medical history, social, surgical and family history all reviewed in electronic medical record.  No pertanent information unless stated regarding to the chief complaint.   Review of Systems: No headache, visual changes, nausea, vomiting, diarrhea, constipation, dizziness, abdominal pain, skin rash, fevers, chills, night sweats, weight loss, swollen lymph nodes, body aches, joint swelling, muscle aches, chest pain, shortness  of breath, mood changes.   Objective Blood pressure 140/88, pulse 80, last menstrual period 06/21/2010.  General: No apparent distress alert and oriented x3 mood and affect normal, dressed appropriately. Morbidly obeseHEENT: Pupils equal, extraocular movements intact  Respiratory: Patient's speak in full sentences and does not appear short of breath  Cardiovascular: No lower extremity edema, non tender, no erythema  Skin: Warm dry intact with no signs of infection or rash on extremities or on axial skeleton.  Abdomen: Soft nontender  Neuro: Cranial nerves II through XII are intact, neurovascularly intact in all extremities with 2+ DTRs and 2+ pulses.  Lymph: No  lymphadenopathy of posterior or anterior cervical chain or axillae bilaterally.  Gait normal with good balance and coordination.  MSK:  Non tender with full range of motion and good stability and symmetric strength and tone of shoulders, elbows, wrist, hip, and ankles bilaterally.  Knee:left Mild resolving bruising noted of the anterior tibia as well as anterior knee. Patient does have enlargement of the calf compared to the contralateral side by approximately half an inch. Tender over the anterior tibialis muscle as well as the lateral gastrocnemius muscle. Seems to be a proximally 2 cm from origin. ROM full in flexion and extension and lower leg rotation. Ligaments with solid consistent endpoints including ACL, PCL, LCL, MCL. Negative Mcmurray's, Apley's, and Thessalonian tests. Non painful patellar compression. Patellar glide without crepitus. Patellar and quadriceps tendons unremarkable. Hamstring and quadriceps strength is normal.  Full strength of the ankle Contralateral ligament unremarkable  MSK US performed of: left tibia This study was ordered, performed, and interpreted by Charlann Boxer D.O.  Knee: Limited ultrasound patient's anterior tibialis muscle as well as lateral gastrocnemius muscle shows a very small hematoma still remaining. Mild calcific changes noted. No true muscle tear appreciated at this time.likely some mild enlargement of the cortex of the tibia consistent with a bone contusion IMPRESSION:  Bone and muscle contusion of the lower leg on the left side with resolving hematoma    Impression and Recommendations:     This case required medical decision making of moderate complexity.      Note: This dictation was prepared with Dragon dictation along with smaller phrase technology. Any transcriptional errors that result from this process are unintentional.

## 2015-11-18 NOTE — Progress Notes (Signed)
Subjective:    Patient ID: Kelly Rowe, female    DOB: 1970/12/09, 45 y.o.   MRN: [id number]  HPI  Here after ED visit for fall down a few days after a misstep on apr 16. RIght handed. Films in ED without overt fracture.  Still c/o significant pain moderate to severe pain to prox lateral upper left leg below the knee, with swelling, marked tender to touch and persistent bruising superfically. Pt denies chest pain, increased sob or doe, wheezing, orthopnea, PND, increased LE swelling, palpitations, dizziness or syncope.  Pt denies new neurological symptoms such as new headache, or facial or extremity weakness or numbness   Pt denies polydipsia, polyuria. Denies worsening depressive symptoms, suicidal ideation, or panic; has ongoing anxiety, some worse with pain. Past Medical History  Diagnosis Date  . ANEMIA-IRON DEFICIENCY 01/30/2010  . ANXIETY 11/27/2007  . Carbuncle and furuncle of trunk 04/16/2010  . DIABETES MELLITUS, TYPE II 08/02/2007  . Edema 01/30/2010  . ELEVATED BLOOD PRESSURE WITHOUT DIAGNOSIS OF HYPERTENSION 08/02/2007  . FREQUENCY, URINARY 05/19/2009  . GENITAL HERPES 03/12/2009  . HIV INFECTION 03/12/2009  . HYPERLIPIDEMIA 08/02/2007  . Metrorrhagia 03/21/2008  . RETENTION, URINE 05/19/2009  . VITAMIN D DEFICIENCY 01/30/2010  . Chlamydia infection 03/21/2008  . HSV (herpes simplex virus) infection   . Trichomonas infection 01/19/2010  . Asthma 02/25/2011  . Arthritis     both knees  . HIV (human immunodeficiency virus infection) ([place]) 2009   Past Surgical History  Procedure Laterality Date  . Cholecystectomy    . Tubal ligation    . Endometrial ablation  01/11/2008    HER OPTION  . Abdominal hysterectomy  07/22/2010    TAH WITH PRESERVATION OF BOTH TUBES AND OVARIES    reports that she has never smoked. She has never used smokeless tobacco. She reports that she drinks alcohol. She reports that she does not use illicit drugs. family history includes Breast cancer in her  paternal aunt; Breast cancer (age of onset: 64) in her maternal uncle; Cancer in her father; Diabetes in her father and mother; Heart disease in her other; Hypertension in her mother; Prostate cancer in her other; Stroke in her other and paternal uncle. Allergies  Allergen Reactions  . Levaquin [Levofloxacin In D5w] Nausea And Vomiting  . Penicillins     REACTION: rash   Current Outpatient Prescriptions on File Prior to Visit  Medication Sig Dispense Refill  . acetaminophen (TYLENOL) 500 MG tablet Take 500 mg by mouth as needed.      Marland Kitchen albuterol (PROVENTIL HFA) 108 (90 BASE) MCG/ACT inhaler Inhale 2 puffs into the lungs every 6 (six) hours as needed for wheezing. 3 Inhaler 5  . ALPRAZolam (XANAX) 0.25 MG tablet Take 0.25 mg by mouth 3 (three) times daily as needed.      Marland Kitchen aspirin 81 MG tablet Take 81 mg by mouth daily.      Marland Kitchen atazanavir (REYATAZ) 300 MG capsule Take 300 mg by mouth daily with breakfast.      . Cholecalciferol (VITAMIN D3) 1000 UNITS CAPS Take by mouth daily.      . cyclobenzaprine (FLEXERIL) 10 MG tablet Take 1 tablet (10 mg total) by mouth 2 (two) times daily as needed for muscle spasms. 20 tablet 0  . ferrous fumarate (HEMOCYTE - 106 MG FE) 325 (106 FE) MG TABS Take 1 tablet by mouth.    . fluticasone (FLONASE) 50 MCG/ACT nasal spray Place 2 sprays into both nostrils daily. 16 g  1  . Fluticasone-Salmeterol (ADVAIR DISKUS) 250-50 MCG/DOSE AEPB Inhale 1 puff into the lungs 2 (two) times daily. 1 each 11  . glipiZIDE (GLIPIZIDE XL) 5 MG 24 hr tablet Take 1 tablet (5 mg total) by mouth daily with breakfast. 90 tablet 3  . glucose blood (ACCU-CHEK SMARTVIEW) test strip Use as directed once daily to check blood sugar.  Diagnosis code 250.00 100 each 11  . Hydrocod Polst-Chlorphen Polst 10-8 MG CP12 Take 1 tablet by mouth 2 (two) times daily as needed. 25 each 1  . ibuprofen (ADVIL,MOTRIN) 800 MG tablet Take 1 tablet (800 mg total) by mouth 3 (three) times daily. 21 tablet 0  .  metFORMIN (GLUCOPHAGE-XR) 500 MG 24 hr tablet Take 1 tablet (500 mg total) by mouth daily with breakfast. 90 tablet 3  . rosuvastatin (CRESTOR) 20 MG tablet Take 1 tablet (20 mg total) by mouth daily. 90 tablet 3  . triamcinolone cream (KENALOG) 0.1 % Apply 1 application topically 2 (two) times daily. 30 g 1  . zolpidem (AMBIEN) 10 MG tablet Take 1 tablet (10 mg total) by mouth at bedtime as needed for sleep. 30 tablet 2   No current facility-administered medications on file prior to visit.    Review of Systems  Constitutional: Negative for unusual diaphoresis or night sweats HENT: Negative for ear swelling or discharge Eyes: Negative for worsening visual haziness  Respiratory: Negative for choking and stridor.   Gastrointestinal: Negative for distension or worsening eructation Genitourinary: Negative for retention or change in urine volume.  Musculoskeletal: Negative for other MSK pain or swelling Skin: Negative for color change and worsening wound Neurological: Negative for tremors and numbness other than noted  Psychiatric/Behavioral: Negative for decreased concentration or agitation other than above       Objective:   Physical Exam BP 140/88 mmHg  Pulse 80  Temp(Src) 98.7 F (37.1 C) (Oral)  Resp 20  Wt 259 lb (117.482 kg)  SpO2 97%  LMP 06/21/2010 VS noted,  Constitutional: Pt appears in no apparent distress HENT: Head: NCAT.  Right Ear: External ear normal.  Left Ear: External ear normal.  Eyes: . Pupils are equal, round, and reactive to light. Conjunctivae and EOM are normal Neck: Normal range of motion. Neck supple.  Cardiovascular: Normal rate and regular rhythm.   Pulmonary/Chest: Effort normal and breath sounds without rales or wheezing.  Left calf with diffuse 2-3+ gastroc area swelling and tender, no compartemental signs, dorsalis pedis 1+ bilat Neurological: Pt is alert. Not confused , motor grossly intact Skin: Skin is warm. No rash, no LE edema Psychiatric:  Pt behavior is normal. No agitation.      Assessment & Plan:

## 2015-11-18 NOTE — Assessment & Plan Note (Signed)
Patient does have more of a left leg contusion. I think that this is secondary to more of the bruising of the muscle as well as the underlying bone. No cortical defect noted on ultrasound today. I do not feel that advance imaging is warranted with patient being able to walk. Full strength of the foot. We discussed with patient that we will try topical anti-inflammatories, icing protocol, we discussed compression sleeve. Likely should resolve within the next 2 weeks. Follow-up in 2 weeks if otherwise.

## 2015-11-20 ENCOUNTER — Ambulatory Visit: Payer: Self-pay | Admitting: Family Medicine

## 2015-11-21 NOTE — Assessment & Plan Note (Signed)
With large contusion, and has seemed worse to pt and does have large swelling, cant r/o gastroc tear, for sport med referral, pain control,  to f/u any worsening symptoms or concerns

## 2015-11-21 NOTE — Assessment & Plan Note (Signed)
stable overall by history and exam, recent data reviewed with pt, and pt to continue medical treatment as before,  to f/u any worsening symptoms or concerns Lab Results  Component Value Date   HGBA1C 7.8* 09/18/2015

## 2015-11-21 NOTE — Assessment & Plan Note (Signed)
Some situational increase with pain, overall mild, no panic, to cont same tx, to f/u any worsening symptoms or concerns

## 2015-12-02 ENCOUNTER — Ambulatory Visit: Payer: BC Managed Care – PPO | Admitting: Family Medicine

## 2015-12-02 DIAGNOSIS — Z0289 Encounter for other administrative examinations: Secondary | ICD-10-CM

## 2015-12-13 ENCOUNTER — Ambulatory Visit (INDEPENDENT_AMBULATORY_CARE_PROVIDER_SITE_OTHER): Payer: BC Managed Care – PPO | Admitting: Family Medicine

## 2015-12-13 ENCOUNTER — Encounter: Payer: Self-pay | Admitting: Family Medicine

## 2015-12-13 VITALS — BP 124/86 | HR 81 | Temp 98.3°F | Wt 257.5 lb

## 2015-12-13 DIAGNOSIS — J0100 Acute maxillary sinusitis, unspecified: Secondary | ICD-10-CM | POA: Diagnosis not present

## 2015-12-13 DIAGNOSIS — J019 Acute sinusitis, unspecified: Secondary | ICD-10-CM | POA: Insufficient documentation

## 2015-12-13 MED ORDER — CEFDINIR 300 MG PO CAPS: 300.0000 mg | ORAL_CAPSULE | Freq: Two times a day (BID) | ORAL | Status: DC

## 2015-12-13 NOTE — Patient Instructions (Signed)
Given your symptoms and history, I am treating you for sinusitis.  I have called in an Rx for you.  Take as prescribed.  Follow up as needed.  Take care  Dr. Lacinda Axon

## 2015-12-13 NOTE — Assessment & Plan Note (Signed)
New acute problem. Given patient's comorbid disease and exam findings, treating empirically for sinusitis with Omnicef.

## 2015-12-13 NOTE — Progress Notes (Signed)
Subjective:  Patient ID: Kelly Rowe, female    DOB: 01/26/1971  Age: 45 y.o. MRN: [id number]  CC: ? Sinus infection  HPI:  45 year old female with a past history of HIV presents with concerns that she has a sinus infection.  Patient reports that she has not been feeling well since Tuesday. She states tdhat she was swimming earlier this week and inhaled a lot of pool water. Since that time she's been experiencing severe facial pain/sinus pressure, nasal congestion, ear pressure and fullness.No associated fevers or chills. She's been taking over-the-counter Flonase without relief. She's also been taking Tylenol with no relief. No known exacerbating factors other than the above.  Patient states that she is compliant with her HIV medication.  Social Hx   Social History   Social History  . Marital Status: Divorced    Spouse Name: N/A  . Number of Children: N/A  . Years of Education: N/A   Social History Main Topics  . Smoking status: Never Smoker   . Smokeless tobacco: Never Used  . Alcohol Use: 0.0 oz/week    0 Standard drinks or equivalent per week     Comment: OCCASIONALLY  . Drug Use: No  . Sexual Activity: Yes    Birth Control/ Protection: Surgical     Comment: HAS HAD A HYSTERECTOMY   Other Topics Concern  . None   Social History Narrative   Review of Systems  Constitutional: Positive for fatigue. Negative for fever.  HENT: Positive for congestion, ear pain and sinus pressure.    Objective:  BP 124/86 mmHg  Pulse 81  Temp(Src) 98.3 F (36.8 C) (Oral)  Wt 257 lb 8 oz (116.801 kg)  SpO2 98%  LMP 06/21/2010  BP/Weight 12/13/2015 11/18/2015 [id number]  Systolic BP [id number]  Diastolic BP 86 88 88  Wt. (Lbs) 257.5 - 259  BMI 47.09 - 47.36   Physical Exam  Constitutional: She appears well-developed. No distress.  HENT:  Head: Normocephalic and atraumatic.  Oropharynx clear. Right TM with moderate erythema. No effusion. Nose - boggy edematous  turbinates. Maxillary sinus tenderness to palpation.   Cardiovascular: Normal rate and regular rhythm.   Pulmonary/Chest: Effort normal. She has no wheezes. She has no rales.  Vitals reviewed.   Lab Results  Component Value Date   WBC 5.4 09/18/2015   HGB 13.1 09/18/2015   HCT 39.3 09/18/2015   PLT 307.0 09/18/2015   GLUCOSE 133* 09/18/2015   CHOL 211* 09/18/2015   TRIG 76.0 09/18/2015   HDL 46.70 09/18/2015   LDLDIRECT 147.7 07/05/2012   LDLCALC 149* 09/18/2015   ALT 16 09/18/2015   AST 16 09/18/2015   NA 135 09/18/2015   K 3.8 09/18/2015   CL 99 09/18/2015   CREATININE 0.69 09/18/2015   BUN 7 09/18/2015   CO2 29 09/18/2015   TSH 2.88 09/18/2015   HGBA1C 7.8* 09/18/2015   MICROALBUR 0.9 03/11/2015   Assessment & Plan:   Problem List Items Addressed This Visit    Acute sinusitis - Primary    New acute problem. Given patient's comorbid disease and exam findings, treating empirically for sinusitis with Omnicef.      Relevant Medications   cefdinir (OMNICEF) 300 MG capsule      Meds ordered this encounter  Medications  . cefdinir (OMNICEF) 300 MG capsule    Sig: Take 1 capsule (300 mg total) by mouth 2 (two) times daily.    Dispense:  20 capsule    Refill:  0  Follow-up: PRN  [place]

## 2015-12-13 NOTE — Progress Notes (Signed)
Pre visit review using our clinic review tool, if applicable. No additional management support is needed unless otherwise documented below in the visit note. 

## 2016-03-16 ENCOUNTER — Other Ambulatory Visit: Payer: Self-pay | Admitting: Internal Medicine

## 2016-03-16 DIAGNOSIS — Z1231 Encounter for screening mammogram for malignant neoplasm of breast: Secondary | ICD-10-CM

## 2016-03-23 ENCOUNTER — Ambulatory Visit
Admission: RE | Admit: 2016-03-23 | Discharge: 2016-03-23 | Disposition: A | Discharge status: Home / Self Care | Payer: BC Managed Care – PPO | Source: Ambulatory Visit | Attending: Internal Medicine | Admitting: Internal Medicine

## 2016-03-23 DIAGNOSIS — Z1231 Encounter for screening mammogram for malignant neoplasm of breast: Secondary | ICD-10-CM

## 2016-03-31 ENCOUNTER — Ambulatory Visit (INDEPENDENT_AMBULATORY_CARE_PROVIDER_SITE_OTHER): Payer: BC Managed Care – PPO | Admitting: Gynecology

## 2016-03-31 ENCOUNTER — Encounter: Payer: Self-pay | Admitting: Gynecology

## 2016-03-31 VITALS — BP 132/88 | Wt 260.0 lb

## 2016-03-31 DIAGNOSIS — Z23 Encounter for immunization: Secondary | ICD-10-CM | POA: Diagnosis not present

## 2016-03-31 DIAGNOSIS — Z01419 Encounter for gynecological examination (general) (routine) without abnormal findings: Secondary | ICD-10-CM | POA: Diagnosis not present

## 2016-03-31 NOTE — Patient Instructions (Signed)

## 2016-03-31 NOTE — Addendum Note (Signed)
Addended by: Thurnell Garbe A on: 03/31/2016 12:38 PM   Modules accepted: Orders

## 2016-03-31 NOTE — Addendum Note (Signed)
Addended by: Thurnell Garbe A on: 03/31/2016 12:10 PM   Modules accepted: Orders

## 2016-03-31 NOTE — Progress Notes (Signed)
Kelly Rowe 1971-07-22 [id number]   History:    45 y.o.  for annual gyn exam with no complaints today. Her PCP is Dr. Jenny Reichmann who has been doing her blood work and treating her for type 2 diabetes as well as hypertension. She is also being followed by the [facility] clinic where she goes every 3 months for her counts and has been doing well. Her past STD history as follows:  Chlamydia infection 2009 HIV infection 2009 Trichomoniasis 2011 HSV GC and to thousand and 8 Patient has not had any change in sexual partners. 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  Past medical history,surgical history, family history and social history were all reviewed and documented in the EPIC chart.  Gynecologic History Patient's last menstrual period was 06/21/2010. Contraception: status post hysterectomy Last Pap: 2016. Results were: normal Last mammogram: 2017. Results were: normal  Obstetric History OB History  Gravida Para Term Preterm AB Living  7 5 5   2 5   SAB TAB Ectopic Multiple Live Births  2       5    # Outcome Date GA Lbr Len/2nd Weight Sex Delivery Anes PTL Lv  7 SAB           6 SAB           5 Term     M Vag-Spont  N LIV  4 Term     M Vag-Spont  N LIV  3 Term     M Vag-Spont  N LIV  2 Term     F Vag-Spont  N LIV  1 Term     M Vag-Spont  N LIV       ROS: A ROS was performed and pertinent positives and negatives are included in the history.  GENERAL: No fevers or chills. HEENT: No change in vision, no earache, sore throat or sinus congestion. NECK: No pain or stiffness. CARDIOVASCULAR: No chest pain or pressure. No palpitations. PULMONARY: No shortness of breath, cough or wheeze. GASTROINTESTINAL: No abdominal pain, nausea, vomiting or diarrhea, melena or bright red blood per rectum. GENITOURINARY: No urinary frequency, urgency, hesitancy or dysuria. MUSCULOSKELETAL: No joint or muscle pain, no back pain,  no recent trauma. DERMATOLOGIC: No rash, no itching, no lesions. ENDOCRINE: No polyuria, polydipsia, no heat or cold intolerance. No recent change in weight. HEMATOLOGICAL: No anemia or easy bruising or bleeding. NEUROLOGIC: No headache, seizures, numbness, tingling or weakness. PSYCHIATRIC: No depression, no loss of interest in normal activity or change in sleep pattern.     Exam: chaperone present  BP 132/88   Wt 260 lb (117.9 kg)   LMP 06/21/2010   BMI 47.55 kg/m   Body mass index is 47.55 kg/m.  General appearance : Well developed well nourished female. No acute distress HEENT: Eyes: no retinal hemorrhage or exudates,  Neck supple, trachea midline, no carotid bruits, no thyroidmegaly Lungs: Clear to auscultation, no rhonchi or wheezes, or rib retractions  Heart: Regular rate and rhythm, no murmurs or gallops Breast:Examined in sitting and supine position were symmetrical in appearance, no palpable masses or tenderness,  no skin retraction, no nipple inversion, no nipple discharge, no skin discoloration, no axillary or supraclavicular lymphadenopathy Abdomen: no palpable masses or tenderness, no rebound or guarding Extremities: no edema or skin discoloration or tenderness  Pelvic:  Bartholin, Urethra, Skene Glands: Within normal limits  Vagina: No gross lesions or discharge  Cervix: Absent  Uterus  absent  Adnexa  Without masses or tenderness  Anus and perineum  normal   Rectovaginal  normal sphincter tone without palpated masses or tenderness             Hemoccult not indicated     Assessment/Plan:  45 y.o. female for annual exam with history of HIV being followed by the [facility] clinic. Her PCP Dr. Jenny Reichmann has been doing her blood work. No blood work done here today. Her Pap smear was done today. Patient was encouraged to do her monthly breast exams.   Terrance Mass MD, 11:09 AM 03/31/2016

## 2016-04-02 LAB — PAP IG W/ RFLX HPV ASCU

## 2016-07-01 ENCOUNTER — Encounter: Payer: Self-pay | Admitting: Internal Medicine

## 2016-07-01 ENCOUNTER — Other Ambulatory Visit (INDEPENDENT_AMBULATORY_CARE_PROVIDER_SITE_OTHER): Payer: BC Managed Care – PPO

## 2016-07-01 ENCOUNTER — Ambulatory Visit (INDEPENDENT_AMBULATORY_CARE_PROVIDER_SITE_OTHER): Payer: BC Managed Care – PPO | Admitting: Internal Medicine

## 2016-07-01 VITALS — BP 136/70 | HR 82 | Temp 98.1°F | Resp 20 | Wt 257.0 lb

## 2016-07-01 DIAGNOSIS — Z0001 Encounter for general adult medical examination with abnormal findings: Secondary | ICD-10-CM

## 2016-07-01 DIAGNOSIS — E119 Type 2 diabetes mellitus without complications: Secondary | ICD-10-CM

## 2016-07-01 DIAGNOSIS — E785 Hyperlipidemia, unspecified: Secondary | ICD-10-CM | POA: Diagnosis not present

## 2016-07-01 DIAGNOSIS — J4521 Mild intermittent asthma with (acute) exacerbation: Secondary | ICD-10-CM

## 2016-07-01 LAB — LIPID PANEL
Cholesterol: 211 mg/dL — ABNORMAL HIGH (ref 0–200)
HDL: 53.3 mg/dL (ref >39.00)
LDL Cholesterol: 144 mg/dL — ABNORMAL HIGH (ref 0–99)
NonHDL: 157.42
Total CHOL/HDL Ratio: 4
Triglycerides: 66 mg/dL (ref 0.0–149.0)
VLDL: 13.2 mg/dL (ref 0.0–40.0)

## 2016-07-01 LAB — HEMOGLOBIN A1C: Hgb A1c MFr Bld: 7.3 % — ABNORMAL HIGH (ref 4.6–6.5)

## 2016-07-01 LAB — HEPATIC FUNCTION PANEL
ALT: 15 U/L (ref 0–35)
AST: 16 U/L (ref 0–37)
Albumin: 4.2 g/dL (ref 3.5–5.2)
Alkaline Phosphatase: 120 U/L — ABNORMAL HIGH (ref 39–117)
Bilirubin, Direct: 0.1 mg/dL (ref 0.0–0.3)
Total Bilirubin: 0.3 mg/dL (ref 0.2–1.2)
Total Protein: 7.8 g/dL (ref 6.0–8.3)

## 2016-07-01 LAB — BASIC METABOLIC PANEL
BUN: 7 mg/dL (ref 6–23)
CO2: 30 mEq/L (ref 19–32)
Calcium: 9.2 mg/dL (ref 8.4–10.5)
Chloride: 100 mEq/L (ref 96–112)
Creatinine, Ser: 0.65 mg/dL (ref 0.40–1.20)
GFR: 125.37 mL/min (ref >60.00)
Glucose, Bld: 146 mg/dL — ABNORMAL HIGH (ref 70–99)
Potassium: 3.6 mEq/L (ref 3.5–5.1)
Sodium: 138 mEq/L (ref 135–145)

## 2016-07-01 MED ORDER — LOSARTAN POTASSIUM 25 MG PO TABS: 25.0000 mg | ORAL_TABLET | Freq: Every day | ORAL | 3 refills | Status: DC

## 2016-07-01 MED ORDER — PREDNISONE 10 MG PO TABS: ORAL_TABLET | ORAL | 0 refills | Status: DC

## 2016-07-01 MED ORDER — GLUCOSE BLOOD VI STRP: ORAL_STRIP | 12 refills | Status: DC

## 2016-07-01 MED ORDER — LANCETS MISC: 11 refills | Status: DC

## 2016-07-01 MED ORDER — ONETOUCH ULTRA 2 W/DEVICE KIT: PACK | 0 refills | Status: DC

## 2016-07-01 NOTE — Progress Notes (Signed)
Pre visit review using our clinic review tool, if applicable. No additional management support is needed unless otherwise documented below in the visit note. 

## 2016-07-01 NOTE — Progress Notes (Signed)
Subjective:    Patient ID: Kelly Rowe, female    DOB: 1971/02/10, 45 y.o.   MRN: [id number]  HPI   Here to f/u; overall doing ok,  Pt denies chest pain, orthopnea, PND, increased LE swelling, palpitations, dizziness or syncope, but has had increased sob, wheezing and nonprod cough x 2 days.   Pt denies new neurological symptoms such as new headache, or facial or extremity weakness or numbness.  Pt denies polydipsia, polyuria, or low sugar episode.   Pt denies new neurological symptoms such as new headache, or facial or extremity weakness or numbness.   Pt states overall good compliance with meds, mostly trying to follow appropriate diet, with wt overall stable,  but little exercise however.   Wt Readings from Last 3 Encounters:  07/01/16 257 lb (116.6 kg)  03/31/16 260 lb (117.9 kg)  12/13/15 257 lb 8 oz (116.8 kg)  Never started the crestor, but willing to do so.  Has been trying to work on diet No other new history Past Medical History:  Diagnosis Date  . ANEMIA-IRON DEFICIENCY 01/30/2010  . ANXIETY 11/27/2007  . Arthritis    both knees  . Asthma 02/25/2011  . Carbuncle and furuncle of trunk 04/16/2010  . Chlamydia infection 03/21/2008  . DIABETES MELLITUS, TYPE II 08/02/2007  . Edema 01/30/2010  . ELEVATED BLOOD PRESSURE WITHOUT DIAGNOSIS OF HYPERTENSION 08/02/2007  . FREQUENCY, URINARY 05/19/2009  . GENITAL HERPES 03/12/2009  . HIV (human immunodeficiency virus infection) ([place]) 2009  . HIV INFECTION 03/12/2009  . HSV (herpes simplex virus) infection   . HYPERLIPIDEMIA 08/02/2007  . Metrorrhagia 03/21/2008  . RETENTION, URINE 05/19/2009  . Trichomonas infection 01/19/2010  . VITAMIN D DEFICIENCY 01/30/2010   Past Surgical History:  Procedure Laterality Date  . ABDOMINAL HYSTERECTOMY  07/22/2010   TAH WITH PRESERVATION OF BOTH TUBES AND OVARIES  . CHOLECYSTECTOMY    . ENDOMETRIAL ABLATION  01/11/2008   HER OPTION  . TUBAL LIGATION      reports that she has never smoked. She has  never used smokeless tobacco. She reports that she drinks alcohol. She reports that she does not use drugs. family history includes Breast cancer in her paternal aunt; Breast cancer (age of onset: 39) in her maternal uncle; Cancer in her father; Diabetes in her father and mother; Heart disease in her other; Hypertension in her mother; Prostate cancer in her other; Stroke in her other and paternal uncle. Allergies  Allergen Reactions  . Levaquin [Levofloxacin In D5w] Nausea And Vomiting  . Penicillins     REACTION: rash   Current Outpatient Prescriptions on File Prior to Visit  Medication Sig Dispense Refill  . acetaminophen (TYLENOL) 500 MG tablet Take 500 mg by mouth as needed.      Marland Kitchen albuterol (PROVENTIL HFA) 108 (90 BASE) MCG/ACT inhaler Inhale 2 puffs into the lungs every 6 (six) hours as needed for wheezing. 3 Inhaler 5  . ALPRAZolam (XANAX) 0.25 MG tablet Take 0.25 mg by mouth 3 (three) times daily as needed.      Marland Kitchen aspirin 81 MG tablet Take 81 mg by mouth daily.      Marland Kitchen atazanavir (REYATAZ) 300 MG capsule Take 300 mg by mouth daily with breakfast.      . Cholecalciferol (VITAMIN D3) 1000 UNITS CAPS Take by mouth daily.      . cyclobenzaprine (FLEXERIL) 10 MG tablet Take 1 tablet (10 mg total) by mouth 2 (two) times daily as needed for muscle spasms. [place]  tablet 0  . Diclofenac Sodium 2 % SOLN Apply 1 pump twice daily. 112 g 3  . ferrous fumarate (HEMOCYTE - 106 MG FE) 325 (106 FE) MG TABS Take 1 tablet by mouth.    . fluticasone (FLONASE) 50 MCG/ACT nasal spray Place 2 sprays into both nostrils daily. 16 g 1  . Fluticasone-Salmeterol (ADVAIR DISKUS) 250-50 MCG/DOSE AEPB Inhale 1 puff into the lungs 2 (two) times daily. 1 each 11  . glipiZIDE (GLIPIZIDE XL) 5 MG 24 hr tablet Take 1 tablet (5 mg total) by mouth daily with breakfast. 90 tablet 3  . Hydrocod Polst-Chlorphen Polst 10-8 MG CP12 Take 1 tablet by mouth 2 (two) times daily as needed. 25 each 1  . HYDROcodone-acetaminophen  (NORCO/VICODIN) 5-325 MG tablet Take 1 tablet by mouth every 6 (six) hours as needed for moderate pain. 40 tablet 0  . ibuprofen (ADVIL,MOTRIN) 800 MG tablet Take 1 tablet (800 mg total) by mouth 3 (three) times daily. 21 tablet 0  . triamcinolone cream (KENALOG) 0.1 % Apply 1 application topically 2 (two) times daily. 30 g 1  . zolpidem (AMBIEN) 10 MG tablet Take 1 tablet (10 mg total) by mouth at bedtime as needed for sleep. 30 tablet 2   No current facility-administered medications on file prior to visit.    Review of Systems  Constitutional: Negative for unusual diaphoresis or night sweats HENT: Negative for ear swelling or discharge Eyes: Negative for worsening visual haziness  Respiratory: Negative for choking and stridor.   Gastrointestinal: Negative for distension or worsening eructation Genitourinary: Negative for retention or change in urine volume.  Musculoskeletal: Negative for other MSK pain or swelling Skin: Negative for color change and worsening wound Neurological: Negative for tremors and numbness other than noted  Psychiatric/Behavioral: Negative for decreased concentration or agitation other than above   All other system neg per pt    Objective:   Physical Exam BP 136/70   Pulse 82   Temp 98.1 F (36.7 C) (Oral)   Resp 20   Wt 257 lb (116.6 kg)   LMP 06/21/2010   SpO2 97%   BMI 47.01 kg/m  VS noted,  Constitutional: Pt appears in no apparent distress HENT: Head: NCAT.  Right Ear: External ear normal.  Left Ear: External ear normal.  Eyes: . Pupils are equal, round, and reactive to light. Conjunctivae and EOM are normal Neck: Normal range of motion. Neck supple.  Cardiovascular: Normal rate and regular rhythm.   Pulmonary/Chest: Effort normal and breath sounds decreased without rales but with mild bilat mild scattered wheezing.  Neurological: Pt is alert. Not confused , motor grossly intact Skin: Skin is warm. No rash, no LE edema Psychiatric: Pt behavior  is normal. No agitation.  No other new exam findings    Assessment & Plan:

## 2016-07-01 NOTE — Patient Instructions (Addendum)
You had the Prevnar pneumonia shot today  Please continue all other medications as before, and refills have been done if requested.  Please have the pharmacy call with any other refills you may need.  Please continue your efforts at being more active, low cholesterol diet, and weight control.  You are otherwise up to date with prevention measures today.  Please keep your appointments with your specialists as you may have planned  Please go to the LAB in the Basement (turn left off the elevator) for the tests to be done today  You will be contacted by phone if any changes need to be made immediately.  Otherwise, you will receive a letter about your results with an explanation, but please check with MyChart first.  Please remember to sign up for MyChart if you have not done so, as this will be important to you in the future with finding out test results, communicating by private email, and scheduling acute appointments online when needed.  Please return in 6 months, or sooner if needed, with Lab testing done 3-5 days before

## 2016-07-02 ENCOUNTER — Encounter: Payer: Self-pay | Admitting: Internal Medicine

## 2016-07-02 ENCOUNTER — Other Ambulatory Visit: Payer: Self-pay | Admitting: Internal Medicine

## 2016-07-02 MED ORDER — ROSUVASTATIN CALCIUM 10 MG PO TABS: 10.0000 mg | ORAL_TABLET | Freq: Every day | ORAL | 3 refills | Status: DC

## 2016-07-02 MED ORDER — METFORMIN HCL ER 500 MG PO TB24: 1000.0000 mg | ORAL_TABLET | Freq: Every day | ORAL | 3 refills | Status: DC

## 2016-07-04 NOTE — Assessment & Plan Note (Signed)
On new OHA since last visit, stable overall by history and exam, recent data reviewed with pt, and pt to continue medical treatment as before,  to f/u any worsening symptoms or concerns Lab Results  Component Value Date   HGBA1C 7.3 (H) 07/01/2016

## 2016-07-04 NOTE — Assessment & Plan Note (Signed)
Has been uncontrolled, would consider statin, o/w stable overall by history and exam, recent data reviewed with pt from last visit, and pt to continue medical treatment as before,  to f/u any worsening symptoms or concerns

## 2016-07-04 NOTE — Assessment & Plan Note (Signed)
With mild exacerbation, for prednisone 20 qd x 5 d

## 2016-10-27 ENCOUNTER — Emergency Department (HOSPITAL_BASED_OUTPATIENT_CLINIC_OR_DEPARTMENT_OTHER)
Admission: EM | Admit: 2016-10-27 | Discharge: 2016-10-27 | Disposition: A | Discharge status: Home / Self Care | Payer: BC Managed Care – PPO | Attending: Emergency Medicine | Admitting: Emergency Medicine

## 2016-10-27 ENCOUNTER — Encounter (HOSPITAL_BASED_OUTPATIENT_CLINIC_OR_DEPARTMENT_OTHER): Payer: Self-pay | Admitting: Emergency Medicine

## 2016-10-27 DIAGNOSIS — Y92481 Parking lot as the place of occurrence of the external cause: Secondary | ICD-10-CM | POA: Diagnosis not present

## 2016-10-27 DIAGNOSIS — E119 Type 2 diabetes mellitus without complications: Secondary | ICD-10-CM | POA: Diagnosis not present

## 2016-10-27 DIAGNOSIS — J45909 Unspecified asthma, uncomplicated: Secondary | ICD-10-CM | POA: Diagnosis not present

## 2016-10-27 DIAGNOSIS — Y9389 Activity, other specified: Secondary | ICD-10-CM | POA: Diagnosis not present

## 2016-10-27 DIAGNOSIS — Z79899 Other long term (current) drug therapy: Secondary | ICD-10-CM | POA: Insufficient documentation

## 2016-10-27 DIAGNOSIS — Z7982 Long term (current) use of aspirin: Secondary | ICD-10-CM | POA: Diagnosis not present

## 2016-10-27 DIAGNOSIS — M25561 Pain in right knee: Secondary | ICD-10-CM | POA: Insufficient documentation

## 2016-10-27 DIAGNOSIS — I10 Essential (primary) hypertension: Secondary | ICD-10-CM | POA: Insufficient documentation

## 2016-10-27 DIAGNOSIS — Y999 Unspecified external cause status: Secondary | ICD-10-CM | POA: Diagnosis not present

## 2016-10-27 DIAGNOSIS — Z7984 Long term (current) use of oral hypoglycemic drugs: Secondary | ICD-10-CM | POA: Diagnosis not present

## 2016-10-27 DIAGNOSIS — Z21 Asymptomatic human immunodeficiency virus [HIV] infection status: Secondary | ICD-10-CM | POA: Diagnosis not present

## 2016-10-27 DIAGNOSIS — S8991XA Unspecified injury of right lower leg, initial encounter: Secondary | ICD-10-CM | POA: Diagnosis present

## 2016-10-27 MED ORDER — NAPROXEN 250 MG PO TABS
500.0000 mg | ORAL_TABLET | Freq: Once | ORAL | Status: AC
  Administered 2016-10-27: 500 mg via ORAL
  Filled 2016-10-27: qty 2

## 2016-10-27 MED ORDER — NAPROXEN 500 MG PO TABS: 500.0000 mg | ORAL_TABLET | Freq: Two times a day (BID) | ORAL | 0 refills | Status: DC

## 2016-10-27 MED ORDER — CYCLOBENZAPRINE HCL 5 MG PO TABS: 5.0000 mg | ORAL_TABLET | Freq: Two times a day (BID) | ORAL | 0 refills | Status: DC | PRN
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
## 2016-10-27 NOTE — ED Notes (Signed)
ED Provider at bedside. 

## 2016-10-27 NOTE — ED Triage Notes (Signed)
Pt ambulates with NAD noted
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
## 2016-12-08 ENCOUNTER — Encounter: Payer: Self-pay | Admitting: Gynecology

## 2017-04-28 ENCOUNTER — Other Ambulatory Visit: Payer: Self-pay | Admitting: Internal Medicine

## 2017-04-28 DIAGNOSIS — Z1231 Encounter for screening mammogram for malignant neoplasm of breast: Secondary | ICD-10-CM

## 2017-05-09 ENCOUNTER — Ambulatory Visit: Payer: Self-pay

## 2017-05-16 ENCOUNTER — Ambulatory Visit
Admission: RE | Admit: 2017-05-16 | Discharge: 2017-05-16 | Disposition: A | Discharge status: Home / Self Care | Payer: BC Managed Care – PPO | Source: Ambulatory Visit | Attending: Internal Medicine | Admitting: Internal Medicine

## 2017-05-16 DIAGNOSIS — Z1231 Encounter for screening mammogram for malignant neoplasm of breast: Secondary | ICD-10-CM

## 2017-06-08 ENCOUNTER — Ambulatory Visit (INDEPENDENT_AMBULATORY_CARE_PROVIDER_SITE_OTHER): Payer: BC Managed Care – PPO | Admitting: Women's Health

## 2017-06-08 ENCOUNTER — Encounter: Payer: Self-pay | Admitting: Women's Health

## 2017-06-08 VITALS — BP 120/82 | Ht 62.0 in | Wt 261.0 lb

## 2017-06-08 DIAGNOSIS — Z01419 Encounter for gynecological examination (general) (routine) without abnormal findings: Secondary | ICD-10-CM | POA: Diagnosis not present

## 2017-06-08 DIAGNOSIS — Z113 Encounter for screening for infections with a predominantly sexual mode of transmission: Secondary | ICD-10-CM | POA: Diagnosis not present

## 2017-06-08 MED ORDER — VALACYCLOVIR HCL 500 MG PO TABS: ORAL_TABLET | ORAL | 12 refills | Status: DC

## 2017-06-08 NOTE — Progress Notes (Signed)
ANAIA FRITH March 30, 1971 [id number]    History:    Presents for annual exam.  2011 TAH for fibroids/menorrhagia, normal Pap and mammogram history. Hypertension, diabetes managed by primary care. HIV +2011 followed at [facility] doing well - low viral count.  Past medical history, past surgical history, family history and social history were all reviewed and documented in the EPIC chart. 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 Mother diabetes on insulin.  ROS:  A ROS was performed and pertinent positives and negatives are included.  Exam:  Vitals:   06/08/17 1403  BP: 120/82  Weight: 261 lb (118.4 kg)  Height: 5\' 2"  (1.575 m)   Body mass index is 47.74 kg/m.   General appearance:  Normal Thyroid:  Symmetrical, normal in size, without palpable masses or nodularity. Respiratory  Auscultation:  Clear without wheezing or rhonchi Cardiovascular  Auscultation:  Regular rate, without rubs, murmurs or gallops  Edema/varicosities:  Not grossly evident Abdominal  Soft,nontender, without masses, guarding or rebound.  Liver/spleen:  No organomegaly noted  Hernia:  None appreciated  Skin  Inspection:  Grossly normal   Breasts: Examined lying and sitting.     Right: Without masses, retractions, discharge or axillary adenopathy.     Left: Without masses, retractions, discharge or axillary adenopathy. Gentitourinary   Inguinal/mons:  Normal without inguinal adenopathy  External genitalia:  Normal  BUS/Urethra/Skene's glands:  Normal  Vagina:  Normal  Cervix: And uterus absent  Adnexa/parametria:     Rt: Without masses or tenderness.   Lt: Without masses or tenderness.  Anus and perineum: Normal  Digital rectal exam: Normal sphincter tone without palpated masses or tenderness  Assessment/Plan:  46 y.o. SBF G7 P5  for annual exam With no complaints.  2011 TAH for fibroids Hypertension, diabetes-primary care manages labs and meds HSV rare outbreaks Positive HIV-[facility]  follows Morbid obesity STD screen  Plan: Reviewed importance of consistent condom use. GC/Chlamydia from urethra, hepatitis B, C, RPR. Pap. SBE's, continue annual screening mammogram, calcium rich diet, vitamin D 1000 daily encouraged. Reviewed importance of increasing exercise and decreasing calories/carbohydrates for weight loss. Valtrex 500 twice daily for 3-5 days as needed, prescription given.   [place], 3:11 PM 06/08/2017

## 2017-06-08 NOTE — Patient Instructions (Signed)
Health Maintenance, Female Adopting a healthy lifestyle and getting preventive care can go a long way to promote health and wellness. Talk with your health care provider about what schedule of regular examinations is right for you. This is a good chance for you to check in with your provider about disease prevention and staying healthy. In between checkups, there are plenty of things you can do on your own. Experts have done a lot of research about which lifestyle changes and preventive measures are most likely to keep you healthy. Ask your health care provider for more information. Weight and diet Eat a healthy diet  Be sure to include plenty of vegetables, fruits, low-fat dairy products, and lean protein.  Do not eat a lot of foods high in solid fats, added sugars, or salt.  Get regular exercise. This is one of the most important things you can do for your health. ? Most adults should exercise for at least 150 minutes each week. The exercise should increase your heart rate and make you sweat (moderate-intensity exercise). ? Most adults should also do strengthening exercises at least twice a week. This is in addition to the moderate-intensity exercise.  Maintain a healthy weight  Body mass index (BMI) is a measurement that can be used to identify possible weight problems. It estimates body fat based on height and weight. Your health care provider can help determine your BMI and help you achieve or maintain a healthy weight.  For females 20 years of age and older: ? A BMI below 18.5 is considered underweight. ? A BMI of 18.5 to 24.9 is normal. ? A BMI of 25 to 29.9 is considered overweight. ? A BMI of 30 and above is considered obese.  Watch levels of cholesterol and blood lipids  You should start having your blood tested for lipids and cholesterol at 48 years of age, then have this test every 5 years.  You may need to have your cholesterol levels checked more often if: ? Your lipid or  cholesterol levels are high. ? You are older than 48 years of age. ? You are at high risk for heart disease.  Cancer screening Lung Cancer  Lung cancer screening is recommended for adults 55-80 years old who are at high risk for lung cancer because of a history of smoking.  A yearly low-dose CT scan of the lungs is recommended for people who: ? Currently smoke. ? Have quit within the past 15 years. ? Have at least a 30-pack-year history of smoking. A pack year is smoking an average of one pack of cigarettes a day for 1 year.  Yearly screening should continue until it has been 15 years since you quit.  Yearly screening should stop if you develop a health problem that would prevent you from having lung cancer treatment.  Breast Cancer  Practice breast self-awareness. This means understanding how your breasts normally appear and feel.  It also means doing regular breast self-exams. Let your health care provider know about any changes, no matter how small.  If you are in your 20s or 30s, you should have a clinical breast exam (CBE) by a health care provider every 1-3 years as part of a regular health exam.  If you are 40 or older, have a CBE every year. Also consider having a breast X-ray (mammogram) every year.  If you have a family history of breast cancer, talk to your health care provider about genetic screening.  If you are at high risk   for breast cancer, talk to your health care provider about having an MRI and a mammogram every year.  Breast cancer gene (BRCA) assessment is recommended for women who have family members with BRCA-related cancers. BRCA-related cancers include: ? Breast. ? Ovarian. ? Tubal. ? Peritoneal cancers.  Results of the assessment will determine the need for genetic counseling and BRCA1 and BRCA2 testing.  Cervical Cancer Your health care provider may recommend that you be screened regularly for cancer of the pelvic organs (ovaries, uterus, and  vagina). This screening involves a pelvic examination, including checking for microscopic changes to the surface of your cervix (Pap test). You may be encouraged to have this screening done every 3 years, beginning at age 22.  For women ages 56-65, health care providers may recommend pelvic exams and Pap testing every 3 years, or they may recommend the Pap and pelvic exam, combined with testing for human papilloma virus (HPV), every 5 years. Some types of HPV increase your risk of cervical cancer. Testing for HPV may also be done on women of any age with unclear Pap test results.  Other health care providers may not recommend any screening for nonpregnant women who are considered low risk for pelvic cancer and who do not have symptoms. Ask your health care provider if a screening pelvic exam is right for you.  If you have had past treatment for cervical cancer or a condition that could lead to cancer, you need Pap tests and screening for cancer for at least 20 years after your treatment. If Pap tests have been discontinued, your risk factors (such as having a new sexual partner) need to be reassessed to determine if screening should resume. Some women have medical problems that increase the chance of getting cervical cancer. In these cases, your health care provider may recommend more frequent screening and Pap tests.  Colorectal Cancer  This type of cancer can be detected and often prevented.  Routine colorectal cancer screening usually begins at 48 years of age and continues through 48 years of age.  Your health care provider may recommend screening at an earlier age if you have risk factors for colon cancer.  Your health care provider may also recommend using home test kits to check for hidden blood in the stool.  A small camera at the end of a tube can be used to examine your colon directly (sigmoidoscopy or colonoscopy). This is done to check for the earliest forms of colorectal  cancer.  Routine screening usually begins at age 33.  Direct examination of the colon should be repeated every 5-10 years through 48 years of age. However, you may need to be screened more often if early forms of precancerous polyps or small growths are found.  Skin Cancer  Check your skin from head to toe regularly.  Tell your health care provider about any new moles or changes in moles, especially if there is a change in a mole's shape or color.  Also tell your health care provider if you have a mole that is larger than the size of a pencil eraser.  Always use sunscreen. Apply sunscreen liberally and repeatedly throughout the day.  Protect yourself by wearing long sleeves, pants, a wide-brimmed hat, and sunglasses whenever you are outside.  Heart disease, diabetes, and high blood pressure  High blood pressure causes heart disease and increases the risk of stroke. High blood pressure is more likely to develop in: ? People who have blood pressure in the high end of  the normal range (130-139/85-89 mm Hg). ? People who are overweight or obese. ? People who are African American.  If you are 21-29 years of age, have your blood pressure checked every 3-5 years. If you are 3 years of age or older, have your blood pressure checked every year. You should have your blood pressure measured twice-once when you are at a hospital or clinic, and once when you are not at a hospital or clinic. Record the average of the two measurements. To check your blood pressure when you are not at a hospital or clinic, you can use: ? An automated blood pressure machine at a pharmacy. ? A home blood pressure monitor.  If you are between 17 years and 37 years old, ask your health care provider if you should take aspirin to prevent strokes.  Have regular diabetes screenings. This involves taking a blood sample to check your fasting blood sugar level. ? If you are at a normal weight and have a low risk for diabetes,  have this test once every three years after 48 years of age. ? If you are overweight and have a high risk for diabetes, consider being tested at a younger age or more often. Preventing infection Hepatitis B  If you have a higher risk for hepatitis B, you should be screened for this virus. You are considered at high risk for hepatitis B if: ? You were born in a country where hepatitis B is common. Ask your health care provider which countries are considered high risk. ? Your parents were born in a high-risk country, and you have not been immunized against hepatitis B (hepatitis B vaccine). ? You have HIV or AIDS. ? You use needles to inject street drugs. ? You live with someone who has hepatitis B. ? You have had sex with someone who has hepatitis B. ? You get hemodialysis treatment. ? You take certain medicines for conditions, including cancer, organ transplantation, and autoimmune conditions.  Hepatitis C  Blood testing is recommended for: ? Everyone born from [birth year] through 1967. ? Anyone with known risk factors for hepatitis C.  Sexually transmitted infections (STIs)  You should be screened for sexually transmitted infections (STIs) including gonorrhea and chlamydia if: ? You are sexually active and are younger than 48 years of age. ? You are older than 48 years of age and your health care provider tells you that you are at risk for this type of infection. ? Your sexual activity has changed since you were last screened and you are at an increased risk for chlamydia or gonorrhea. Ask your health care provider if you are at risk.  If you do not have HIV, but are at risk, it may be recommended that you take a prescription medicine daily to prevent HIV infection. This is called pre-exposure prophylaxis (PrEP). You are considered at risk if: ? You are sexually active and do not regularly use condoms or know the HIV status of your partner(s). ? You take drugs by injection. ? You are  sexually active with a partner who has HIV.  Talk with your health care provider about whether you are at high risk of being infected with HIV. If you choose to begin PrEP, you should first be tested for HIV. You should then be tested every 3 months for as long as you are taking PrEP. Pregnancy  If you are premenopausal and you may become pregnant, ask your health care provider about preconception counseling.  If you may become  pregnant, take 400 to 800 micrograms (mcg) of folic acid every day.  If you want to prevent pregnancy, talk to your health care provider about birth control (contraception). Osteoporosis and menopause  Osteoporosis is a disease in which the bones lose minerals and strength with aging. This can result in serious bone fractures. Your risk for osteoporosis can be identified using a bone density scan.  If you are 65 years of age or older, or if you are at risk for osteoporosis and fractures, ask your health care provider if you should be screened.  Ask your health care provider whether you should take a calcium or vitamin D supplement to lower your risk for osteoporosis.  Menopause may have certain physical symptoms and risks.  Hormone replacement therapy may reduce some of these symptoms and risks. Talk to your health care provider about whether hormone replacement therapy is right for you. Follow these instructions at home:  Schedule regular health, dental, and eye exams.  Stay current with your immunizations.  Do not use any tobacco products including cigarettes, chewing tobacco, or electronic cigarettes.  If you are pregnant, do not drink alcohol.  If you are breastfeeding, limit how much and how often you drink alcohol.  Limit alcohol intake to no more than 1 drink per day for nonpregnant women. One drink equals 12 ounces of beer, 5 ounces of wine, or 1 ounces of hard liquor.  Do not use street drugs.  Do not share needles.  Ask your health care  provider for help if you need support or information about quitting drugs.  Tell your health care provider if you often feel depressed.  Tell your health care provider if you have ever been abused or do not feel safe at home. This information is not intended to replace advice given to you by your health care provider. Make sure you discuss any questions you have with your health care provider. Document Released: 01/25/2011 Document Revised: 12/18/2015 Document Reviewed: 04/15/2015 Elsevier Interactive Patient Education  2018 [employer] Carbohydrate Counting for Diabetes Mellitus, Adult Carbohydrate counting is a method for keeping track of how many carbohydrates you eat. Eating carbohydrates naturally increases the amount of sugar (glucose) in the blood. Counting how many carbohydrates you eat helps keep your blood glucose within normal limits, which helps you manage your diabetes (diabetes mellitus). It is important to know how many carbohydrates you can safely have in each meal. This is different for every person. A diet and nutrition specialist (registered dietitian) can help you make a meal plan and calculate how many carbohydrates you should have at each meal and snack. Carbohydrates are found in the following foods:  Grains, such as breads and cereals.  Dried beans and soy products.  Starchy vegetables, such as potatoes, peas, and corn.  Fruit and fruit juices.  Milk and yogurt.  Sweets and snack foods, such as cake, cookies, candy, chips, and soft drinks.  How do I count carbohydrates? There are two ways to count carbohydrates in food. You can use either of the methods or a combination of both. Reading "Nutrition Facts" on packaged food The "Nutrition Facts" list is included on the labels of almost all packaged foods and beverages in the [place] It includes:  The serving size.  Information about nutrients in each serving, including the grams (g) of carbohydrate per  serving.  To use the "Nutrition Facts":  Decide how many servings you will have.  Multiply the number of servings by the number of carbohydrates   per serving.  The resulting number is the total amount of carbohydrates that you will be having.  Learning standard serving sizes of other foods When you eat foods containing carbohydrates that are not packaged or do not include "Nutrition Facts" on the label, you need to measure the servings in order to count the amount of carbohydrates:  Measure the foods that you will eat with a food scale or measuring cup, if needed.  Decide how many standard-size servings you will eat.  Multiply the number of servings by 15. Most carbohydrate-rich foods have about 15 g of carbohydrates per serving. ? For example, if you eat 8 oz (170 g) of strawberries, you will have eaten 2 servings and 30 g of carbohydrates (2 servings x 15 g = 30 g).  For foods that have more than one food mixed, such as soups and casseroles, you must count the carbohydrates in each food that is included.  The following list contains standard serving sizes of common carbohydrate-rich foods. Each of these servings has about 15 g of carbohydrates:   hamburger bun or  English muffin.   oz (15 mL) syrup.   oz (14 g) jelly.  1 slice of bread.  1 six-inch tortilla.  3 oz (85 g) cooked rice or pasta.  4 oz (113 g) cooked dried beans.  4 oz (113 g) starchy vegetable, such as peas, corn, or potatoes.  4 oz (113 g) hot cereal.  4 oz (113 g) mashed potatoes or  of a large baked potato.  4 oz (113 g) canned or frozen fruit.  4 oz (120 mL) fruit juice.  4-6 crackers.  6 chicken nuggets.  6 oz (170 g) unsweetened dry cereal.  6 oz (170 g) plain fat-free yogurt or yogurt sweetened with artificial sweeteners.  8 oz (240 mL) milk.  8 oz (170 g) fresh fruit or one small piece of fruit.  24 oz (680 g) popped popcorn.  Example of carbohydrate counting Sample meal  3  oz (85 g) chicken breast.  6 oz (170 g) brown rice.  4 oz (113 g) corn.  8 oz (240 mL) milk.  8 oz (170 g) strawberries with sugar-free whipped topping. Carbohydrate calculation 1. Identify the foods that contain carbohydrates: ? Rice. ? Corn. ? Milk. ? Strawberries. 2. Calculate how many servings you have of each food: ? 2 servings rice. ? 1 serving corn. ? 1 serving milk. ? 1 serving strawberries. 3. Multiply each number of servings by 15 g: ? 2 servings rice x 15 g = 30 g. ? 1 serving corn x 15 g = 15 g. ? 1 serving milk x 15 g = 15 g. ? 1 serving strawberries x 15 g = 15 g. 4. Add together all of the amounts to find the total grams of carbohydrates eaten: ? 30 g + 15 g + 15 g + 15 g = 75 g of carbohydrates total. This information is not intended to replace advice given to you by your health care provider. Make sure you discuss any questions you have with your health care provider. Document Released: 07/12/2005 Document Revised: 01/30/2016 Document Reviewed: 12/24/2015 Elsevier Interactive Patient Education  [employer].

## 2017-06-09 LAB — C. TRACHOMATIS/N. GONORRHOEAE RNA
C. trachomatis RNA, TMA: NOT DETECTED
N. gonorrhoeae RNA, TMA: NOT DETECTED

## 2017-06-09 LAB — HEPATITIS C ANTIBODY
Hepatitis C Ab: NONREACTIVE
SIGNAL TO CUT-OFF: 0.01 (ref <1.00)

## 2017-06-09 LAB — RPR: RPR Ser Ql: NONREACTIVE

## 2017-06-09 LAB — HEPATITIS B SURFACE ANTIGEN: Hepatitis B Surface Ag: NONREACTIVE

## 2017-06-10 LAB — PAP IG W/ RFLX HPV ASCU

## 2017-06-13 ENCOUNTER — Other Ambulatory Visit: Payer: Self-pay | Admitting: Women's Health

## 2017-06-13 MED ORDER — METRONIDAZOLE 500 MG PO TABS: 500.0000 mg | ORAL_TABLET | Freq: Two times a day (BID) | ORAL | 0 refills | Status: DC

## 2017-07-04 ENCOUNTER — Encounter: Payer: Self-pay | Admitting: Internal Medicine

## 2017-07-26 HISTORY — PX: BREAST LUMPECTOMY: SHX2

## 2017-08-03 ENCOUNTER — Encounter: Payer: Self-pay | Admitting: Internal Medicine

## 2017-09-08 ENCOUNTER — Encounter: Payer: Self-pay | Admitting: Internal Medicine

## 2017-09-08 ENCOUNTER — Ambulatory Visit: Payer: BC Managed Care – PPO | Admitting: Internal Medicine

## 2017-09-08 ENCOUNTER — Other Ambulatory Visit (INDEPENDENT_AMBULATORY_CARE_PROVIDER_SITE_OTHER): Payer: BC Managed Care – PPO

## 2017-09-08 VITALS — BP 152/90 | HR 107 | Temp 98.2°F | Ht 62.0 in | Wt 259.0 lb

## 2017-09-08 DIAGNOSIS — J4521 Mild intermittent asthma with (acute) exacerbation: Secondary | ICD-10-CM

## 2017-09-08 DIAGNOSIS — R059 Cough, unspecified: Secondary | ICD-10-CM

## 2017-09-08 DIAGNOSIS — R05 Cough: Secondary | ICD-10-CM | POA: Diagnosis not present

## 2017-09-08 DIAGNOSIS — B20 Human immunodeficiency virus [HIV] disease: Secondary | ICD-10-CM

## 2017-09-08 DIAGNOSIS — I10 Essential (primary) hypertension: Secondary | ICD-10-CM | POA: Insufficient documentation

## 2017-09-08 DIAGNOSIS — E119 Type 2 diabetes mellitus without complications: Secondary | ICD-10-CM

## 2017-09-08 LAB — CBC
HCT: 38.5 % (ref 36.0–46.0)
Hemoglobin: 12.7 g/dL (ref 12.0–15.0)
MCHC: 33 g/dL (ref 30.0–36.0)
MCV: 84.5 fl (ref 78.0–100.0)
Platelets: 321 10*3/uL (ref 150.0–400.0)
RBC: 4.56 Mil/uL (ref 3.87–5.11)
RDW: 13.7 % (ref 11.5–15.5)
WBC: 9.1 10*3/uL (ref 4.0–10.5)

## 2017-09-08 LAB — COMPREHENSIVE METABOLIC PANEL
ALT: 16 U/L (ref 0–35)
AST: 15 U/L (ref 0–37)
Albumin: 4.1 g/dL (ref 3.5–5.2)
Alkaline Phosphatase: 144 U/L — ABNORMAL HIGH (ref 39–117)
BUN: 8 mg/dL (ref 6–23)
CO2: 30 mEq/L (ref 19–32)
Calcium: 9.2 mg/dL (ref 8.4–10.5)
Chloride: 95 mEq/L — ABNORMAL LOW (ref 96–112)
Creatinine, Ser: 0.68 mg/dL (ref 0.40–1.20)
GFR: 118.41 mL/min (ref >60.00)
Glucose, Bld: 338 mg/dL — ABNORMAL HIGH (ref 70–99)
Potassium: 3.2 mEq/L — ABNORMAL LOW (ref 3.5–5.1)
Sodium: 135 mEq/L (ref 135–145)
Total Bilirubin: 0.3 mg/dL (ref 0.2–1.2)
Total Protein: 7.7 g/dL (ref 6.0–8.3)

## 2017-09-08 LAB — LIPID PANEL
Cholesterol: 202 mg/dL — ABNORMAL HIGH (ref 0–200)
HDL: 48.8 mg/dL (ref >39.00)
LDL Cholesterol: 135 mg/dL — ABNORMAL HIGH (ref 0–99)
NonHDL: 153.03
Total CHOL/HDL Ratio: 4
Triglycerides: 92 mg/dL (ref 0.0–149.0)
VLDL: 18.4 mg/dL (ref 0.0–40.0)

## 2017-09-08 LAB — HEMOGLOBIN A1C: Hgb A1c MFr Bld: 9.9 % — ABNORMAL HIGH (ref 4.6–6.5)

## 2017-09-08 MED ORDER — BECLOMETHASONE DIPROPIONATE 80 MCG/ACT NA AERS: 2.0000 | INHALATION_SPRAY | Freq: Every day | NASAL | 1 refills | Status: DC

## 2017-09-08 MED ORDER — FLUCONAZOLE 150 MG PO TABS: 150.0000 mg | ORAL_TABLET | ORAL | 0 refills | Status: DC

## 2017-09-08 MED ORDER — LOSARTAN POTASSIUM 25 MG PO TABS: 25.0000 mg | ORAL_TABLET | Freq: Every day | ORAL | 0 refills | Status: DC

## 2017-09-08 MED ORDER — ALBUTEROL SULFATE HFA 108 (90 BASE) MCG/ACT IN AERS
2.0000 | INHALATION_SPRAY | Freq: Four times a day (QID) | RESPIRATORY_TRACT | 0 refills | Status: DC | PRN
Start: 2017-09-08 — End: 2018-01-30

## 2017-09-08 MED ORDER — ALBUTEROL SULFATE (2.5 MG/3ML) 0.083% IN NEBU
2.5000 mg | INHALATION_SOLUTION | Freq: Once | RESPIRATORY_TRACT | Status: AC
  Administered 2017-09-08: 2.5 mg via RESPIRATORY_TRACT

## 2017-09-08 MED ORDER — DOXYCYCLINE HYCLATE 100 MG PO TABS: 100.0000 mg | ORAL_TABLET | Freq: Two times a day (BID) | ORAL | 0 refills | Status: DC

## 2017-09-08 NOTE — Assessment & Plan Note (Signed)
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 Refill 1 month losartan and needs follow up with labs with PCP.

## 2017-09-08 NOTE — Patient Instructions (Signed)
We have checked the labs today and will send them to Dr. Jenny Reichmann to adjust if needed.   We have refilled the blood pressure medicine.   We have sent in doxycycline 1 pill twice a day for 1 week.   We have sent in the nose spray to use to help with drainage.

## 2017-09-08 NOTE — Assessment & Plan Note (Addendum)
Given albuterol nebulizer in the office with some clearing of her lungs. Rx for albuterol inhaler for SOB and doxycycline. She does have asthma on her history and previously on advair. Treat flare and then needs evaluation of necessity of chronic treatment. Refill flonase and encouraged to start using this.

## 2017-09-08 NOTE — Progress Notes (Signed)
   Subjective:    Patient ID: Kelly Rowe, female    DOB: January 17, 1971, 47 y.o.   MRN: [id number]  HPI The patient is a 47 YO female coming in for cold symptoms and some fevers going on about 9 days now. 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 She also has history of using adviar and albuterol for mild intermittent asthma several years ago. She is out of all these and has no breathing medications currently. She is under care for HIV with recent visit (08/30/17) with undetected viral load and normal CD4 count. She is not taking any of her other medications except hiv meds and valtrex and has not been seen by her PCP for about 1-2 years now. She started about 9 days ago with sore throat. From there increase in congestion and cough. She is having sinus pressure now. She has had some worsening since onset. She did have some fevers but not in the last several days. She denies muscle aches. Some pain in her left neck.   Review of Systems  Constitutional: Positive for activity change, appetite change and chills. Negative for fatigue, fever and unexpected weight change.  HENT: Positive for congestion, postnasal drip, rhinorrhea and sinus pressure. Negative for ear discharge, ear pain, sinus pain, sneezing, sore throat, tinnitus, trouble swallowing and voice change.   Eyes: Negative.   Respiratory: Positive for cough, chest tightness and shortness of breath. Negative for wheezing.   Cardiovascular: Negative.   Gastrointestinal: Negative.   Musculoskeletal: Positive for myalgias.  Skin: Negative.   Neurological: Negative.   Psychiatric/Behavioral: Negative.       Objective:   Physical Exam  Constitutional: She is oriented to person, place, and time. She appears well-developed and well-nourished.  HENT:  Head: Normocephalic and atraumatic.  Oropharynx with redness and clear drainage, nose with swollen turbinates, TMs normal bilaterally  Eyes: EOM are normal.  Neck: Normal range of motion. No  thyromegaly present.  Cardiovascular: Normal rate and regular rhythm.  Pulmonary/Chest: Effort normal and breath sounds normal. No respiratory distress. She has no wheezes. She has no rales.  Very tight before nebulizer, after nebulizer there is some rhonchi and better air flow.   Abdominal: Soft. She exhibits no distension. There is no tenderness. There is no rebound.  Musculoskeletal: She exhibits tenderness.  Lymphadenopathy:    She has no cervical adenopathy.  Neurological: She is alert and oriented to person, place, and time.  Skin: Skin is warm and dry.   Vitals:   09/08/17 1439  BP: (!) 152/90  Pulse: (!) 107  Temp: 98.2 F (36.8 C)  TempSrc: Oral  SpO2: 98%  Weight: 259 lb (117.5 kg)  Height: 5\' 2"  (1.575 m)      Assessment & Plan:  Albuterol nebulizer given at visit

## 2017-09-08 NOTE — Assessment & Plan Note (Addendum)
Flare today, not taking anything. Previously on advair and albuterol prn. Refill albuterol prn and doxycycline for lung findings. Needs visit to follow up for resolution and consideration of chronic treatment if needed. Will avoid prednisone given untreated asthma.

## 2017-09-08 NOTE — Assessment & Plan Note (Signed)
No follow up in some time and will check labs while she is present today. Not taking anything but supposed to be taking metformin and glipizide. Not on ARB or statin.

## 2017-09-09 ENCOUNTER — Encounter: Payer: Self-pay | Admitting: Internal Medicine

## 2017-09-09 NOTE — Assessment & Plan Note (Signed)
Per review of care everywhere she is up to date with care and taking her medication as prescribed. CD4 count last normal.

## 2017-09-13 ENCOUNTER — Telehealth: Payer: Self-pay | Admitting: Internal Medicine

## 2017-09-13 MED ORDER — METFORMIN HCL ER 500 MG PO TB24: 1000.0000 mg | ORAL_TABLET | Freq: Every day | ORAL | 0 refills | Status: DC

## 2017-09-13 NOTE — Telephone Encounter (Signed)
Reviewed chart pt is up-to-date sent refills to [facility].../lmb  

## 2017-09-13 NOTE — Telephone Encounter (Signed)
Copied from [place]. Topic: General - Other >> Sep 13, 2017  2:02 PM Darl Householder, RMA wrote: Reason for CRM: Medication refill request for metFORMIN (GLUCOPHAGE-XR) 500 MG to be sent to [facility]

## 2017-09-15 ENCOUNTER — Telehealth: Payer: Self-pay

## 2017-09-15 MED ORDER — AZELASTINE-FLUTICASONE 137-50 MCG/ACT NA SUSP: NASAL | 5 refills | Status: DC

## 2017-09-15 NOTE — Telephone Encounter (Signed)
Received a fax from pharmacy stating that insurance will not cover Qnasl but will cover Dymista

## 2017-09-15 NOTE — Telephone Encounter (Signed)
[place] for change to dymista - done erx

## 2017-09-15 NOTE — Telephone Encounter (Signed)
The qnasal which was denied is the only one I know of

## 2017-09-15 NOTE — Telephone Encounter (Signed)
With concurrent usage of stribild and reyataz it is not recommended to use fluticasone nasal sprays due to risk of systemic adrenal insufficiency. Do you still want to use dymista? Only betamethasone nose sprays are considered safe.

## 2017-09-15 NOTE — Telephone Encounter (Signed)
LVM informing patient.

## 2017-09-15 NOTE — Telephone Encounter (Signed)
I cannot find a betamethasone nasal spray to prescribe - is there a brand name?

## 2017-09-27 ENCOUNTER — Encounter: Payer: Self-pay | Admitting: Internal Medicine

## 2017-09-27 ENCOUNTER — Other Ambulatory Visit (INDEPENDENT_AMBULATORY_CARE_PROVIDER_SITE_OTHER): Payer: BC Managed Care – PPO

## 2017-09-27 ENCOUNTER — Ambulatory Visit (INDEPENDENT_AMBULATORY_CARE_PROVIDER_SITE_OTHER): Payer: BC Managed Care – PPO | Admitting: Internal Medicine

## 2017-09-27 VITALS — BP 124/78 | HR 72 | Temp 98.0°F | Ht 62.0 in | Wt 256.0 lb

## 2017-09-27 DIAGNOSIS — Z0000 Encounter for general adult medical examination without abnormal findings: Secondary | ICD-10-CM

## 2017-09-27 DIAGNOSIS — E119 Type 2 diabetes mellitus without complications: Secondary | ICD-10-CM

## 2017-09-27 DIAGNOSIS — E785 Hyperlipidemia, unspecified: Secondary | ICD-10-CM | POA: Diagnosis not present

## 2017-09-27 DIAGNOSIS — R232 Flushing: Secondary | ICD-10-CM

## 2017-09-27 LAB — URINALYSIS, ROUTINE W REFLEX MICROSCOPIC
Bilirubin Urine: NEGATIVE
Hgb urine dipstick: NEGATIVE
Ketones, ur: NEGATIVE
Leukocytes, UA: NEGATIVE
Nitrite: NEGATIVE
RBC / HPF: NONE SEEN (ref 0–?)
Specific Gravity, Urine: >=1.03 — AB (ref 1.000–1.030)
Total Protein, Urine: NEGATIVE
Urine Glucose: NEGATIVE
Urobilinogen, UA: 0.2 (ref 0.0–1.0)
pH: 5.5 (ref 5.0–8.0)

## 2017-09-27 LAB — BASIC METABOLIC PANEL
BUN: 13 mg/dL (ref 6–23)
CO2: 30 mEq/L (ref 19–32)
Calcium: 9.5 mg/dL (ref 8.4–10.5)
Chloride: 98 mEq/L (ref 96–112)
Creatinine, Ser: 0.75 mg/dL (ref 0.40–1.20)
GFR: 105.73 mL/min (ref >60.00)
Glucose, Bld: 245 mg/dL — ABNORMAL HIGH (ref 70–99)
Potassium: 4.2 mEq/L (ref 3.5–5.1)
Sodium: 135 mEq/L (ref 135–145)

## 2017-09-27 LAB — MICROALBUMIN / CREATININE URINE RATIO
Creatinine,U: 162.8 mg/dL
Microalb Creat Ratio: 0.7 mg/g (ref 0.0–30.0)
Microalb, Ur: 1.2 mg/dL (ref 0.0–1.9)

## 2017-09-27 LAB — TSH: TSH: 1.41 u[IU]/mL (ref 0.35–4.50)

## 2017-09-27 LAB — FOLLICLE STIMULATING HORMONE: FSH: 40.7 m[IU]/mL

## 2017-09-27 MED ORDER — GLIPIZIDE ER 5 MG PO TB24: 5.0000 mg | ORAL_TABLET | Freq: Every day | ORAL | 3 refills | Status: DC

## 2017-09-27 MED ORDER — METFORMIN HCL ER 500 MG PO TB24: 1000.0000 mg | ORAL_TABLET | Freq: Every day | ORAL | 3 refills | Status: DC

## 2017-09-27 MED ORDER — DULAGLUTIDE 0.75 MG/0.5ML ~~LOC~~ SOAJ: 0.7500 mg | SUBCUTANEOUS | 11 refills | Status: DC

## 2017-09-27 MED ORDER — BLOOD GLUCOSE METER KIT: PACK | 0 refills | Status: AC

## 2017-09-27 MED ORDER — ROSUVASTATIN CALCIUM 40 MG PO TABS: 40.0000 mg | ORAL_TABLET | Freq: Every day | ORAL | 3 refills | Status: DC

## 2017-09-27 NOTE — Patient Instructions (Signed)
Please take all new medication as prescribed - the trulicity  OK to increase the crestor to 40 mg per day  Please continue all other medications as before, including the restart of glipizide  Please have the pharmacy call with any other refills you may need.  Please continue your efforts at being more active, low cholesterol diet, and weight control.  You are otherwise up to date with prevention measures today.  Please keep your appointments with your specialists as you may have planned  Please go to the LAB in the Basement (turn left off the elevator) for the tests to be done today  You will be contacted by phone if any changes need to be made immediately.  Otherwise, you will receive a letter about your results with an explanation, but please check with MyChart first.  Please remember to sign up for MyChart if you have not done so, as this will be important to you in the future with finding out test results, communicating by private email, and scheduling acute appointments online when needed.  Please return in 6 months, or sooner if needed, with Lab testing done 3-5 days before

## 2017-09-27 NOTE — Progress Notes (Signed)
Subjective:    Patient ID: Kelly Rowe, female    DOB: 1970-09-22, 47 y.o.   MRN: [id number]  HPI  Here for wellness and f/u;  Overall doing ok;  Pt denies Chest pain, worsening SOB, DOE, wheezing, orthopnea, PND, worsening LE edema, palpitations, dizziness or syncope.  Pt denies neurological change such as new headache, facial or extremity weakness.  Pt denies polydipsia, polyuria, or low sugar symptoms. Pt states overall good compliance with treatment and medications, good tolerability, and has been trying to follow appropriate diet.  Pt denies worsening depressive symptoms, suicidal ideation or panic. No fever, night sweats, wt loss, loss of appetite, or other constitutional symptoms.  Pt states good ability with ADL's, has low fall risk, home safety reviewed and adequate, no other significant changes in hearing or vision, and only occasionally active with exercise.  Ran out of glipizide er 5 mg, metformin causes increased bowel freq  Declines flu shot and pneumovax  Just had recent URI.  Also with several hot flashes ? Menopause.  No other new complaint or interval change Wt Readings from Last 3 Encounters:  09/27/17 256 lb (116.1 kg)  09/08/17 259 lb (117.5 kg)  06/08/17 261 lb (118.4 kg)   Past Medical History:  Diagnosis Date  . ANEMIA-IRON DEFICIENCY 01/30/2010  . ANXIETY 11/27/2007  . Arthritis    both knees  . Asthma 02/25/2011  . Carbuncle and furuncle of trunk 04/16/2010  . Chlamydia infection 03/21/2008  . DIABETES MELLITUS, TYPE II 08/02/2007  . Edema 01/30/2010  . ELEVATED BLOOD PRESSURE WITHOUT DIAGNOSIS OF HYPERTENSION 08/02/2007  . FREQUENCY, URINARY 05/19/2009  . GENITAL HERPES 03/12/2009  . HIV (human immunodeficiency virus infection) ([place]) 2009  . HIV INFECTION 03/12/2009  . HSV (herpes simplex virus) infection   . HYPERLIPIDEMIA 08/02/2007  . Metrorrhagia 03/21/2008  . RETENTION, URINE 05/19/2009  . Trichomonas infection 01/19/2010  . VITAMIN D DEFICIENCY 01/30/2010   Past  Surgical History:  Procedure Laterality Date  . ABDOMINAL HYSTERECTOMY  07/22/2010   TAH WITH PRESERVATION OF BOTH TUBES AND OVARIES  . CHOLECYSTECTOMY    . ENDOMETRIAL ABLATION  01/11/2008   HER OPTION  . TUBAL LIGATION      reports that  has never smoked. she has never used smokeless tobacco. She reports that she drinks alcohol. She reports that she does not use drugs. family history includes Breast cancer in her paternal aunt; Breast cancer (age of onset: 91) in her maternal uncle; Cancer in her father; Diabetes in her father and mother; Heart disease in her other; Hypertension in her mother; Prostate cancer in her other; Stroke in her other and paternal uncle. Allergies  Allergen Reactions  . Levaquin [Levofloxacin In D5w] Nausea And Vomiting  . Penicillins     REACTION: rash   Current Outpatient Medications on File Prior to Visit  Medication Sig Dispense Refill  . acetaminophen (TYLENOL) 500 MG tablet Take 500 mg by mouth as needed.      Marland Kitchen albuterol (PROVENTIL HFA) 108 (90 Base) MCG/ACT inhaler Inhale 2 puffs into the lungs every 6 (six) hours as needed for wheezing. 1 Inhaler 0  . ALPRAZolam (XANAX) 0.25 MG tablet Take 0.25 mg by mouth 3 (three) times daily as needed.      Marland Kitchen aspirin 81 MG tablet Take 81 mg by mouth daily.      Marland Kitchen atazanavir (REYATAZ) 300 MG capsule Take 300 mg by mouth daily with breakfast.      . Azelastine-Fluticasone (DYMISTA) 137-50 MCG/ACT  SUSP Use as directed 1 spray each side twice per day as needed 23 g 5  . Beclomethasone Dipropionate (QNASL) 80 MCG/ACT AERS Place 2 sprays into the nose daily. 1 Inhaler 1  . Cholecalciferol (VITAMIN D3) 1000 UNITS CAPS Take by mouth daily.      Marland Kitchen elvitegravir-cobicistat-emtricitabine-tenofovir (STRIBILD) 150-150-200-300 MG TABS tablet Take 1 tablet daily by mouth.    . ferrous fumarate (HEMOCYTE - 106 MG FE) 325 (106 FE) MG TABS Take 1 tablet by mouth.    . fluconazole (DIFLUCAN) 150 MG tablet Take 1 tablet (150 mg total)  by mouth every 3 (three) days. 2 tablet 0  . Fluticasone-Salmeterol (ADVAIR DISKUS) 250-50 MCG/DOSE AEPB Inhale 1 puff into the lungs 2 (two) times daily. 1 each 11  . losartan (COZAAR) 25 MG tablet Take 1 tablet (25 mg total) by mouth daily. 90 tablet 0  . valACYclovir (VALTREX) 500 MG tablet Take twice daily for 3-5 days 30 tablet 12   No current facility-administered medications on file prior to visit.    Review of Systems Constitutional: Negative for other unusual diaphoresis, sweats, appetite or weight changes HENT: Negative for other worsening hearing loss, ear pain, facial swelling, mouth sores or neck stiffness.   Eyes: Negative for other worsening pain, redness or other visual disturbance.  Respiratory: Negative for other stridor or swelling Cardiovascular: Negative for other palpitations or other chest pain  Gastrointestinal: Negative for worsening diarrhea or loose stools, blood in stool, distention or other pain Genitourinary: Negative for hematuria, flank pain or other change in urine volume.  Musculoskeletal: Negative for myalgias or other joint swelling.  Skin: Negative for other color change, or other wound or worsening drainage.  Neurological: Negative for other syncope or numbness. Hematological: Negative for other adenopathy or swelling Psychiatric/Behavioral: Negative for hallucinations, other worsening agitation, SI, self-injury, or new decreased concentration All other system neg per pt    Objective:   Physical Exam BP 124/78   Pulse 72   Temp 98 F (36.7 C) (Oral)   Ht 5\' 2"  (1.575 m)   Wt 256 lb (116.1 kg)   LMP 06/21/2010   SpO2 97%   BMI 46.82 kg/m  VS noted,  Constitutional: Pt is oriented to person, place, and time. Appears well-developed and well-nourished, in no significant distress and comfortable Head: Normocephalic and atraumatic  Eyes: Conjunctivae and EOM are normal. Pupils are equal, round, and reactive to light Right Ear: External ear normal  without discharge Left Ear: External ear normal without discharge Nose: Nose without discharge or deformity Mouth/Throat: Oropharynx is without other ulcerations and moist  Neck: Normal range of motion. Neck supple. No JVD present. No tracheal deviation present or significant neck LA or mass Cardiovascular: Normal rate, regular rhythm, normal heart sounds and intact distal pulses.   Pulmonary/Chest: WOB normal and breath sounds without rales or wheezing  Abdominal: Soft. Bowel sounds are normal. NT. No HSM  Musculoskeletal: Normal range of motion. Exhibits no edema Lymphadenopathy: Has no other cervical adenopathy.  Neurological: Pt is alert and oriented to person, place, and time. Pt has normal reflexes. No cranial nerve deficit. Motor grossly intact, Gait intact Skin: Skin is warm and dry. No rash noted or new ulcerations Psychiatric:  Has normal mood and affect. Behavior is normal without agitation No other exam findings    Assessment & Plan:

## 2017-09-30 DIAGNOSIS — R232 Flushing: Secondary | ICD-10-CM | POA: Insufficient documentation

## 2017-09-30 NOTE — Assessment & Plan Note (Signed)
Goal ldl < 70 - for increased crestor 40 mg

## 2017-09-30 NOTE — Assessment & Plan Note (Addendum)
stable overall by history and exam, recent data reviewed with pt, and pt to continue medical treatment as before,  to f/u any worsening symptoms or concerns,  Lab Results  Component Value Date   HGBA1C 9.9 (H) 47/34/0370  to add trulicity asd

## 2017-09-30 NOTE — Assessment & Plan Note (Signed)
For [facility] with labs

## 2017-09-30 NOTE — Assessment & Plan Note (Signed)

## 2017-11-28 ENCOUNTER — Encounter: Payer: Self-pay | Admitting: Internal Medicine

## 2017-11-28 ENCOUNTER — Ambulatory Visit: Payer: BC Managed Care – PPO | Admitting: Internal Medicine

## 2017-11-28 VITALS — BP 122/82 | HR 77 | Temp 99.0°F | Ht 62.0 in | Wt 258.0 lb

## 2017-11-28 DIAGNOSIS — I10 Essential (primary) hypertension: Secondary | ICD-10-CM | POA: Diagnosis not present

## 2017-11-28 DIAGNOSIS — M7989 Other specified soft tissue disorders: Secondary | ICD-10-CM

## 2017-11-28 DIAGNOSIS — J4520 Mild intermittent asthma, uncomplicated: Secondary | ICD-10-CM | POA: Diagnosis not present

## 2017-11-28 DIAGNOSIS — M79662 Pain in left lower leg: Secondary | ICD-10-CM | POA: Diagnosis not present

## 2017-11-28 DIAGNOSIS — M7660 Achilles tendinitis, unspecified leg: Secondary | ICD-10-CM | POA: Insufficient documentation

## 2017-11-28 MED ORDER — IBUPROFEN 800 MG PO TABS: 800.0000 mg | ORAL_TABLET | Freq: Three times a day (TID) | ORAL | 0 refills | Status: AC | PRN

## 2017-11-28 MED ORDER — TRAMADOL HCL 50 MG PO TABS: 50.0000 mg | ORAL_TABLET | Freq: Three times a day (TID) | ORAL | 0 refills | Status: DC | PRN

## 2017-11-28 NOTE — Assessment & Plan Note (Signed)
stable overall by history and exam, and pt to continue medical treatment as before,  to f/u any worsening symptoms or concerns 

## 2017-11-28 NOTE — Assessment & Plan Note (Signed)
Most likely c/w severe achilles tendonitis but cant r/o dvt, for ibuprofen, tramadol prn, check LE venous doppler and refer sports medicine

## 2017-11-28 NOTE — Patient Instructions (Signed)
Please take all new medication as prescribed - the tramadol for pain  Please continue all other medications as before, including the ibuprofen  You will be contacted regarding the referral for: Leg circulation test, and sports medicine in this office  Please have the pharmacy call with any other refills you may need.  Please continue your efforts at being more active, low cholesterol diet, and weight control.  Please keep your appointments with your specialists as you may have planned

## 2017-11-28 NOTE — Progress Notes (Signed)
Subjective:    Patient ID: Kelly Rowe, female    DOB: May 30, 1971, 48 y.o.   MRN: [id number]  HPI  Here with 2 wks onset mild but now gradually worsening to severe LLE pain mostly to the left achilles it seems but now involves the whole post leg with swelling and tenderness, no knee effusion or proximal leg symptoms, worse to walk, better to rest except last night it woke her up,  ibupforen 800 not working.  Nothing else makes better or worse, no fever, falls or hx of DVT  Pt denies chest pain, increased sob or doe, wheezing, orthopnea, PND, increased LE swelling, palpitations, dizziness or syncope.  Pt denies new neurological symptoms such as new headache, or facial or extremity weakness or numbness   Pt denies polydipsia, polyuria Past Medical History:  Diagnosis Date  . ANEMIA-IRON DEFICIENCY 01/30/2010  . ANXIETY 11/27/2007  . Arthritis    both knees  . Asthma 02/25/2011  . Carbuncle and furuncle of trunk 04/16/2010  . Chlamydia infection 03/21/2008  . DIABETES MELLITUS, TYPE II 08/02/2007  . Edema 01/30/2010  . ELEVATED BLOOD PRESSURE WITHOUT DIAGNOSIS OF HYPERTENSION 08/02/2007  . FREQUENCY, URINARY 05/19/2009  . GENITAL HERPES 03/12/2009  . HIV (human immunodeficiency virus infection) ([place]) 2009  . HIV INFECTION 03/12/2009  . HSV (herpes simplex virus) infection   . HYPERLIPIDEMIA 08/02/2007  . Metrorrhagia 03/21/2008  . RETENTION, URINE 05/19/2009  . Trichomonas infection 01/19/2010  . VITAMIN D DEFICIENCY 01/30/2010   Past Surgical History:  Procedure Laterality Date  . ABDOMINAL HYSTERECTOMY  07/22/2010   TAH WITH PRESERVATION OF BOTH TUBES AND OVARIES  . CHOLECYSTECTOMY    . ENDOMETRIAL ABLATION  01/11/2008   HER OPTION  . TUBAL LIGATION      reports that she has never smoked. She has never used smokeless tobacco. She reports that she drinks alcohol. She reports that she does not use drugs. family history includes Breast cancer in her paternal aunt; Breast cancer (age of onset:  46) in her maternal uncle; Cancer in her father; Diabetes in her father and mother; Heart disease in her other; Hypertension in her mother; Prostate cancer in her other; Stroke in her other and paternal uncle. Allergies  Allergen Reactions  . Levaquin [Levofloxacin In D5w] Nausea And Vomiting  . Penicillins     REACTION: rash   Current Outpatient Medications on File Prior to Visit  Medication Sig Dispense Refill  . acetaminophen (TYLENOL) 500 MG tablet Take 500 mg by mouth as needed.      Marland Kitchen albuterol (PROVENTIL HFA) 108 (90 Base) MCG/ACT inhaler Inhale 2 puffs into the lungs every 6 (six) hours as needed for wheezing. 1 Inhaler 0  . ALPRAZolam (XANAX) 0.25 MG tablet Take 0.25 mg by mouth 3 (three) times daily as needed.      Marland Kitchen aspirin 81 MG tablet Take 81 mg by mouth daily.      Marland Kitchen atazanavir (REYATAZ) 300 MG capsule Take 300 mg by mouth daily with breakfast.      . Azelastine-Fluticasone (DYMISTA) 137-50 MCG/ACT SUSP Use as directed 1 spray each side twice per day as needed 23 g 5  . Beclomethasone Dipropionate (QNASL) 80 MCG/ACT AERS Place 2 sprays into the nose daily. 1 Inhaler 1  . blood glucose meter kit and supplies Dispense based on patient and insurance preference. Use up to four times daily as directed. (FOR ICD-10 E10.9, E11.9). 1 each 0  . Cholecalciferol (VITAMIN D3) 1000 UNITS CAPS Take by  mouth daily.      . Dulaglutide (TRULICITY) 1.79 XT/0.5WP SOPN Inject 0.75 mg into the skin once a week. 6 mL 11  . elvitegravir-cobicistat-emtricitabine-tenofovir (STRIBILD) 150-150-200-300 MG TABS tablet Take 1 tablet daily by mouth.    . ferrous fumarate (HEMOCYTE - 106 MG FE) 325 (106 FE) MG TABS Take 1 tablet by mouth.    . fluconazole (DIFLUCAN) 150 MG tablet Take 1 tablet (150 mg total) by mouth every 3 (three) days. 2 tablet 0  . Fluticasone-Salmeterol (ADVAIR DISKUS) 250-50 MCG/DOSE AEPB Inhale 1 puff into the lungs 2 (two) times daily. 1 each 11  . glipiZIDE (GLIPIZIDE XL) 5 MG 24 hr  tablet Take 1 tablet (5 mg total) by mouth daily with breakfast. 90 tablet 3  . losartan (COZAAR) 25 MG tablet Take 1 tablet (25 mg total) by mouth daily. 90 tablet 0  . metFORMIN (GLUCOPHAGE-XR) 500 MG 24 hr tablet Take 2 tablets (1,000 mg total) by mouth daily with breakfast. Annual appt is due must see provider for future refills 180 tablet 3  . rosuvastatin (CRESTOR) 40 MG tablet Take 1 tablet (40 mg total) by mouth daily. 90 tablet 3  . valACYclovir (VALTREX) 500 MG tablet Take twice daily for 3-5 days 30 tablet 12   No current facility-administered medications on file prior to visit.    Review of Systems  Constitutional: Negative for other unusual diaphoresis or sweats HENT: Negative for ear discharge or swelling Eyes: Negative for other worsening visual disturbances Respiratory: Negative for stridor or other swelling  Gastrointestinal: Negative for worsening distension or other blood Genitourinary: Negative for retention or other urinary change Musculoskeletal: Negative for other MSK pain or swelling Skin: Negative for color change or other new lesions Neurological: Negative for worsening tremors and other numbness  Psychiatric/Behavioral: Negative for worsening agitation or other fatigue All other system neg per pt    Objective:   Physical Exam BP 122/82   Pulse 77   Temp 99 F (37.2 C) (Oral)   Ht 5' 2"  (1.575 m)   Wt 258 lb (117 kg)   LMP 06/21/2010   SpO2 96%   BMI 47.19 kg/m  VS noted, not ill appearing Constitutional: Pt appears in NAD HENT: Head: NCAT.  Right Ear: External ear normal.  Left Ear: External ear normal.  Eyes: . Pupils are equal, round, and reactive to light. Conjunctivae and EOM are normal Nose: without d/c or deformity Neck: Neck supple. Gross normal ROM Cardiovascular: Normal rate and regular rhythm.   Pulmonary/Chest: Effort normal and breath sounds without rales or wheezing.  LLE with diffuse tender swelling below the knee including the  gastroc area without cords, worse tender to the achilles and achilles insertion site, o/w neurovasc intact Neurological: Pt is alert. At baseline orientation, motor grossly intact Skin: Skin is warm. No rashes, other new lesions, no LE edema Psychiatric: Pt behavior is normal without agitation  No other exam findings    Assessment & Plan:

## 2017-11-28 NOTE — Assessment & Plan Note (Signed)
stable overall by history and exam, recent data reviewed with pt, and pt to continue medical treatment as before,  to f/u any worsening symptoms or concerns BP Readings from Last 3 Encounters:  11/28/17 122/82  09/27/17 124/78  09/08/17 (!) 152/90

## 2017-11-29 ENCOUNTER — Ambulatory Visit (HOSPITAL_COMMUNITY): Payer: BC Managed Care – PPO

## 2017-11-29 ENCOUNTER — Other Ambulatory Visit: Payer: BC Managed Care – PPO

## 2017-11-29 ENCOUNTER — Encounter: Payer: Self-pay | Admitting: Internal Medicine

## 2017-11-29 ENCOUNTER — Ambulatory Visit (HOSPITAL_COMMUNITY)
Admission: RE | Admit: 2017-11-29 | Discharge: 2017-11-29 | Disposition: A | Discharge status: Home / Self Care | Payer: BC Managed Care – PPO | Source: Ambulatory Visit | Attending: Internal Medicine | Admitting: Internal Medicine

## 2017-11-29 DIAGNOSIS — M79662 Pain in left lower leg: Secondary | ICD-10-CM | POA: Diagnosis present

## 2017-11-29 DIAGNOSIS — M7989 Other specified soft tissue disorders: Secondary | ICD-10-CM | POA: Insufficient documentation

## 2017-11-30 ENCOUNTER — Ambulatory Visit: Payer: Self-pay | Admitting: Family Medicine

## 2017-11-30 ENCOUNTER — Ambulatory Visit: Payer: Self-pay

## 2017-11-30 ENCOUNTER — Encounter: Payer: Self-pay | Admitting: Family Medicine

## 2017-11-30 ENCOUNTER — Ambulatory Visit: Payer: BC Managed Care – PPO | Admitting: Family Medicine

## 2017-11-30 VITALS — BP 136/86 | HR 86 | Ht 62.0 in | Wt 253.0 lb

## 2017-11-30 DIAGNOSIS — M79605 Pain in left leg: Secondary | ICD-10-CM | POA: Diagnosis not present

## 2017-11-30 DIAGNOSIS — M7662 Achilles tendinitis, left leg: Secondary | ICD-10-CM

## 2017-11-30 MED ORDER — DICLOFENAC SODIUM 2 % TD SOLN: 1.0000 "application " | Freq: Two times a day (BID) | TRANSDERMAL | 3 refills | Status: DC

## 2017-11-30 NOTE — Progress Notes (Signed)
Kelly Rowe - 47 y.o. female MRN [id number]  Date of birth: 03/18/1971  SUBJECTIVE:  Including CC & ROS.  Chief Complaint  Patient presents with  . Left leg pain    Kelly Rowe is a 47 y.o. female that is presenting with left leg pain. Pain has been ongoing for two weeks. Located at her achilles and radiates to her calf. Pain is throbbing in nature. Mild to severe when standing and walking. She has been taking motrin for the pain. Denies injury.  She has noticed swelling near the and exertion of the Achilles.  She denies any injury.  Has pain with pushing off.  Symptoms seem to be getting worse.     Review of Systems  Constitutional: Negative for fever.  HENT: Negative for congestion.   Respiratory: Negative for cough.   Cardiovascular: Negative for chest pain.  Gastrointestinal: Negative for abdominal pain.  Musculoskeletal: Positive for gait problem.  Skin: Negative for color change.  Neurological: Negative for weakness.  Hematological: Negative for adenopathy.  Psychiatric/Behavioral: Negative for agitation.    HISTORY: Past Medical, Surgical, Social, and Family History Reviewed & Updated per EMR.   Pertinent Historical Findings include:  Past Medical History:  Diagnosis Date  . ANEMIA-IRON DEFICIENCY 01/30/2010  . ANXIETY 11/27/2007  . Arthritis    both knees  . Asthma 02/25/2011  . Carbuncle and furuncle of trunk 04/16/2010  . Chlamydia infection 03/21/2008  . DIABETES MELLITUS, TYPE II 08/02/2007  . Edema 01/30/2010  . ELEVATED BLOOD PRESSURE WITHOUT DIAGNOSIS OF HYPERTENSION 08/02/2007  . FREQUENCY, URINARY 05/19/2009  . GENITAL HERPES 03/12/2009  . HIV (human immunodeficiency virus infection) ([place]) 2009  . HIV INFECTION 03/12/2009  . HSV (herpes simplex virus) infection   . HYPERLIPIDEMIA 08/02/2007  . Metrorrhagia 03/21/2008  . RETENTION, URINE 05/19/2009  . Trichomonas infection 01/19/2010  . VITAMIN D DEFICIENCY 01/30/2010    Past Surgical History:    Procedure Laterality Date  . ABDOMINAL HYSTERECTOMY  07/22/2010   TAH WITH PRESERVATION OF BOTH TUBES AND OVARIES  . CHOLECYSTECTOMY    . ENDOMETRIAL ABLATION  01/11/2008   HER OPTION  . TUBAL LIGATION      Allergies  Allergen Reactions  . Levaquin [Levofloxacin In D5w] Nausea And Vomiting  . Penicillins     REACTION: rash    Family History  Problem Relation Age of Onset  . Prostate cancer Other   . Heart disease Other   . Stroke Other   . Diabetes Mother   . Hypertension Mother   . Diabetes Father   . Cancer Father        COLON and LU NG  . Stroke Paternal Uncle   . Breast cancer Maternal Uncle 72  . Breast cancer Paternal Aunt      Social History   Socioeconomic History  . Marital status: Divorced    Spouse name: Not on file  . Number of children: Not on file  . Years of education: Not on file  . Highest education level: Not on file  Occupational History  . Not on file  Social Needs  . Financial resource strain: Not on file  . Food insecurity:    Worry: Not on file    Inability: Not on file  . Transportation needs:    Medical: Not on file    Non-medical: Not on file  Tobacco Use  . Smoking status: Never Smoker  . Smokeless tobacco: Never Used  Substance and Sexual Activity  . Alcohol use:  Yes    Alcohol/week: 0.0 oz    Comment: OCCASIONALLY  . Drug use: No  . Sexual activity: Yes    Birth control/protection: Surgical    Comment: HAS HAD A HYSTERECTOMY  Lifestyle  . Physical activity:    Days per week: Not on file    Minutes per session: Not on file  . Stress: Not on file  Relationships  . Social connections:    Talks on phone: Not on file    Gets together: Not on file    Attends religious service: Not on file    Active member of club or organization: Not on file    Attends meetings of clubs or organizations: Not on file    Relationship status: Not on file  . Intimate partner violence:    Fear of current or ex partner: Not on file     Emotionally abused: Not on file    Physically abused: Not on file    Forced sexual activity: Not on file  Other Topics Concern  . Not on file  Social History Narrative  . Not on file     PHYSICAL EXAM:  VS: BP 136/86 (BP Location: Left Arm, Patient Position: Sitting, Cuff Size: Normal)   Pulse 86   Ht 5\' 2"  (1.575 m)   Wt 253 lb (114.8 kg)   LMP 06/21/2010   SpO2 98%   BMI 46.27 kg/m  Physical Exam Gen: NAD, alert, cooperative with exam, well-appearing ENT: normal lips, normal nasal mucosa,  Eye: normal EOM, normal conjunctiva and lids CV:  no edema, +2 pedal pulses   Resp: no accessory muscle use, non-labored,   Skin: no rashes, no areas of induration  Neuro: normal tone, normal sensation to touch Psych:  normal insight, alert and oriented MSK:  Left ankle: Obvious swelling of the Achilles roughly 2 cm proximal from the insertion Tenderness to palpation over this area Normal strength resistance. Thompson test with plantarflexion Neurovascular intact  Limited ultrasound: Left Ankle:  Normal-appearing posterior tibialis Achilles with normal insertion.  Obvious thickening roughly 2 to 3 cm proximal to the insertion.  No significant mucoid cyst changes.  No tear  Summary: Achilles tendinitis  Ultrasound and interpretation by Clearance Coots, MD      ASSESSMENT & PLAN:   Achilles tendinitis Acute in nature.  Has not had any changes in exercise.  No tear appreciated -Pennsaid -Counseled on heel lifts -Would calm this down before trying to perform any home exercise therapies -Can continue the anti-inflammatory for 7 days then would switch the topical -Counseled on icing depression -If no improvement may need to place in a cam walker for short period of time

## 2017-11-30 NOTE — Patient Instructions (Signed)
Please try the heel lifts.  Ice the area as much as you can tolerate.  Please try to avoid pushing off as much as you can  Please follow up with me in 3-4 weeks.

## 2017-12-01 NOTE — Assessment & Plan Note (Signed)
Acute in nature.  Has not had any changes in exercise.  No tear appreciated -Pen[place]aid -Counseled on heel lifts -Would calm this down before trying to perform any home exercise therapies -Can continue the anti-inflammatory for 7 days then would switch the topical -Counseled on icing depression -If no improvement may need to place in a cam walker for short period of time

## 2018-01-03 ENCOUNTER — Telehealth: Payer: Self-pay | Admitting: *Deleted

## 2018-01-03 ENCOUNTER — Ambulatory Visit: Payer: BC Managed Care – PPO | Admitting: Women's Health

## 2018-01-03 ENCOUNTER — Encounter: Payer: Self-pay | Admitting: Women's Health

## 2018-01-03 VITALS — BP 120/82

## 2018-01-03 DIAGNOSIS — N6310 Unspecified lump in the right breast, unspecified quadrant: Secondary | ICD-10-CM

## 2018-01-03 DIAGNOSIS — Z803 Family history of malignant neoplasm of breast: Secondary | ICD-10-CM

## 2018-01-03 NOTE — Telephone Encounter (Signed)
-----   Message from [name], NP sent at 01/03/2018  1:27 PM EDT ----- Kelly Rowe needs a diagnostic right breast mammogram with ultrasound.  1 week history of a 2 to 3 cm mass right breast at 9 o'clock position slightly tender.  Paternal uncle and paternal aunt history of breast cancer.  Has had mammograms at the breast center.  Last mammogram 04/2017.  Anytime is okay.

## 2018-01-03 NOTE — Progress Notes (Signed)
47 year old SBF G7, P5 presents with right breast tender nodule noted 1 week ago.  Questioned if it was a bruise or problem from underwire bra.  Normal mammogram history, last mammogram 04/2017.  Paternal uncle, paternal aunt with breast cancer.  2011 TAH for fibroids.  Hypertension, diabetes, asthma  managed by primary care.  HIV positive [facility].  Denies injury, change in routine.  Denies any other concerns of vaginal discharge, urinary symptoms, abdominal pain or fever.  Exam: Appears well.  Obese, breast pendulous, breasts examined sitting and lying position left breast without palpable nodules, right breast at 9 o'clock position 2 to 3 cm mobile slightly tender smooth mobile mass palpated.  No visible dimpling, retractions or nipple discharge.  Right breast mass  Plan: Diagnostic mammogram with ultrasound.

## 2018-01-03 NOTE — Telephone Encounter (Signed)
Patient scheduled at breast center on 01/05/18 @ 2pm pt informed.

## 2018-01-03 NOTE — Telephone Encounter (Signed)
Patient called c/o right breast lump, will have forward to appointment desk to call and schedule OV with Izora Gala.

## 2018-01-05 ENCOUNTER — Other Ambulatory Visit: Payer: Self-pay | Admitting: Women's Health

## 2018-01-05 ENCOUNTER — Ambulatory Visit
Admission: RE | Admit: 2018-01-05 | Discharge: 2018-01-05 | Disposition: A | Discharge status: Home / Self Care | Payer: BC Managed Care – PPO | Source: Ambulatory Visit | Attending: Women's Health | Admitting: Women's Health

## 2018-01-05 DIAGNOSIS — Z803 Family history of malignant neoplasm of breast: Secondary | ICD-10-CM

## 2018-01-05 DIAGNOSIS — N6310 Unspecified lump in the right breast, unspecified quadrant: Secondary | ICD-10-CM

## 2018-01-05 DIAGNOSIS — R599 Enlarged lymph nodes, unspecified: Secondary | ICD-10-CM

## 2018-01-11 ENCOUNTER — Ambulatory Visit
Admission: RE | Admit: 2018-01-11 | Discharge: 2018-01-11 | Disposition: A | Discharge status: Home / Self Care | Payer: BC Managed Care – PPO | Source: Ambulatory Visit | Attending: Women's Health | Admitting: Women's Health

## 2018-01-11 ENCOUNTER — Other Ambulatory Visit: Payer: Self-pay | Admitting: Women's Health

## 2018-01-11 ENCOUNTER — Ambulatory Visit
Admission: RE | Admit: 2018-01-11 | Discharge: 2018-01-11 | Disposition: A | Discharge status: Home / Self Care | Payer: Self-pay | Source: Ambulatory Visit | Attending: Women's Health | Admitting: Women's Health

## 2018-01-11 DIAGNOSIS — N6310 Unspecified lump in the right breast, unspecified quadrant: Secondary | ICD-10-CM

## 2018-01-11 DIAGNOSIS — R599 Enlarged lymph nodes, unspecified: Secondary | ICD-10-CM

## 2018-01-13 ENCOUNTER — Encounter: Payer: Self-pay | Admitting: *Deleted

## 2018-01-16 ENCOUNTER — Telehealth: Payer: Self-pay | Admitting: Oncology

## 2018-01-16 NOTE — Telephone Encounter (Signed)
Patient returned call for afternoon [facility] appointment on 6/26, packet mailed to patient

## 2018-01-17 ENCOUNTER — Other Ambulatory Visit: Payer: Self-pay

## 2018-01-17 ENCOUNTER — Other Ambulatory Visit: Payer: Self-pay | Admitting: *Deleted

## 2018-01-17 DIAGNOSIS — Z170 Estrogen receptor positive status [ER+]: Secondary | ICD-10-CM | POA: Insufficient documentation

## 2018-01-17 DIAGNOSIS — C50411 Malignant neoplasm of upper-outer quadrant of right female breast: Secondary | ICD-10-CM | POA: Insufficient documentation

## 2018-01-17 NOTE — Progress Notes (Signed)
Kelly Rowe  [phone] [phone]     ID: Kelly Rowe DOB: 1970/08/02  MR#: [id number]  RXY#:[id number]  Patient Care Team: Biagio Borg, MD as PCP - Kelly Citizen, MD as Consulting Physician (General Surgery) Victoriya Pol, [name], MD as Consulting Physician (Oncology) [name], MD as Attending Physician (Radiation Oncology) [name], NP as Nurse Practitioner (Obstetrics and Gynecology) Marlinda Mike, PA-C as Referring Physician (Physician Assistant) OTHER MD:   CHIEF COMPLAINT: Estrogen receptor positive breast cancer  CURRENT TREATMENT: Awaiting definitive surgery   HISTORY OF CURRENT ILLNESS: "Shawntrice" noticed bruising in her lateral right breast and palpated a mass  on 12/23/2017. She followed up with her gynecologist. She underwent unilateral right diagnostic mammography with tomography and right breast ultrasonography at [facility] on 01/05/2018 showing: breast density category B. There is an irregular highly suspicious mass within the right breast at the 9:30 o'clock upper outer quadrant, measuring 1.8 x 1.1 x 1.5 cm, and located 18 cm from the nipple,  Ultrasonography revealed a single morphologically abnormal lymph node in the RIGHT axilla, with cortical thickness of 6 mm.   Accordingly on 01/11/2018 she proceeded to biopsy of the right breast area in question as well as a suspicious lymph node. The pathology from this procedure showed ([id number]): Invasive ductal carcinoma grade III.  The lymph node biopsied was negative for carcinoma (concordant).. Prognostic indicators significant for: estrogen receptor, 60% positive with weak staining intensity and progesterone receptor, 10% positive with strong staining intensity. Proliferation marker Ki67 at 30%. HER2 amplified with ratios HER2/CEP17 signals 2.26 and average HER2 copies per cell 3.50  The patient's subsequent history is as detailed below.  INTERVAL  HISTORY: Kelly Rowe was evaluated in the multidisciplinary breast cancer clinic on 01/18/2018 accompanied by 2 of her 3 sisters, a brother in law, her mother and 1 of her daughters. Her case was also presented at the multidisciplinary breast cancer conference on the same day. At that time a preliminary plan was proposed: Breast conserving surgery, adjuvant chemotherapy and anti-HER-2 immunotherapy, adjuvant radiation, antiestrogens, and genetics testing.   REVIEW OF SYSTEMS: Kelly Rowe was out of town celebrating her birthday when she felt the lump and saw bruising on her right breast. She followed up with her gynecologist and was referred to mammography.  She had no other symptoms.  The patient denies unusual headaches, visual changes, nausea, vomiting, stiff neck, dizziness, or gait imbalance. There has been no cough, phlegm production, or pleurisy, no chest pain or pressure, and no change in bowel or bladder habits. The patient denies fever, rash, bleeding, unexplained fatigue or unexplained weight loss. A detailed review of systems was otherwise entirely negative.   PAST MEDICAL HISTORY: Past Medical History:  Diagnosis Date  . ANEMIA-IRON DEFICIENCY 01/30/2010  . ANXIETY 11/27/2007  . Arthritis    both knees  . Asthma 02/25/2011  . Carbuncle and furuncle of trunk 04/16/2010  . Chlamydia infection 03/21/2008  . DIABETES MELLITUS, TYPE II 08/02/2007  . Edema 01/30/2010  . ELEVATED BLOOD PRESSURE WITHOUT DIAGNOSIS OF HYPERTENSION 08/02/2007  . FREQUENCY, URINARY 05/19/2009  . GENITAL HERPES 03/12/2009  . HIV (human immunodeficiency virus infection) ([place]) 2009  . HIV INFECTION 03/12/2009  . HSV (herpes simplex virus) infection   . HYPERLIPIDEMIA 08/02/2007  . Metrorrhagia 03/21/2008  . RETENTION, URINE 05/19/2009  . Trichomonas infection 01/19/2010  . VITAMIN D DEFICIENCY 01/30/2010    PAST SURGICAL HISTORY: Past Surgical History:  Procedure Laterality Date  . ABDOMINAL HYSTERECTOMY  07/22/2010   TAH  WITH PRESERVATION OF BOTH TUBES AND OVARIES  . CHOLECYSTECTOMY    . ENDOMETRIAL ABLATION  01/11/2008   HER OPTION  . TUBAL LIGATION      FAMILY HISTORY Family History  Problem Relation Age of Onset  . Prostate cancer Other   . Heart disease Other   . Stroke Other   . Diabetes Mother   . Hypertension Mother   . Diabetes Father   . Cancer Father        COLON and LU NG  . Stroke Paternal Uncle   . Breast cancer Maternal Uncle 72  . Breast cancer Paternal Aunt   . Lung cancer Maternal Grandmother   . Colon cancer Paternal Grandfather   . Lung cancer Paternal Aunt   . Breast cancer Paternal 24   The patient's father died at age 75 due to metastatic liver cancer. The patient's mother is alive at 34. The patient's has 1 brother and 3 sisters. There was a paternal grandfather with colon cancer. There were 3 paternal aunts with breast cancer, 2 diagnosed in the 23's and 1 at age 38. There was a maternal grandmother with mesothelioma. The patient otherwise denies a history of ovarian cancer in the family.    GYNECOLOGIC HISTORY:  Patient's last menstrual period was 06/21/2010. Menarche: 47 years old Age at first live birth: 47 years old She is GXP5. Her LMP was December 2011. She is status post partial hysterectomy without oophorectomy. She took oral contraception for 3 years with no complications. She never took HRT.    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  At home are 2 of her sons, Glendell Docker and [name], and the patient's grandson, Amador Cunas 80 (who is Willie's son). The patient's oldest is [place] age 23 who lives in [place] as a cook. Daughter, [place] age 44 works as a Scientist, water quality. Son, Glendell Docker age 72 is disabled. Son, [name] age 66 lives in [place] as a Microbiologist. Daughter, Benard Rink age 62 also is a Microbiologist. The patient has 5 grandchildren and no great grandchildren. The patient does not belong to a church.       ADVANCED DIRECTIVES: Not in place; at the 01/18/2018 visit the  patient was given the appropriate documents to complete on notarized at her discretion   HEALTH MAINTENANCE: Social History   Tobacco Use  . Smoking status: Never Smoker  . Smokeless tobacco: Never Used  Substance Use Topics  . Alcohol use: Yes    Alcohol/week: 0.0 oz    Comment: OCCASIONALLY  . Drug use: No     Colonoscopy: Not yet  PAP: November 2018  Bone density: Never   Allergies  Allergen Reactions  . Levaquin [Levofloxacin In D5w] Nausea And Vomiting  . Penicillins     REACTION: rash    Current Outpatient Medications  Medication Sig Dispense Refill  . blood glucose meter kit and supplies Dispense based on patient and insurance preference. Use up to four times daily as directed. (FOR ICD-10 E10.9, E11.9). 1 each 0  . Dulaglutide (TRULICITY) 3.88 EK/8.0KL SOPN Inject 0.75 mg into the skin once a week. 6 mL 11  . elvitegravir-cobicistat-emtricitabine-tenofovir (STRIBILD) 150-150-200-300 MG TABS tablet Take 1 tablet daily by mouth.    . ferrous fumarate (HEMOCYTE - 106 MG FE) 325 (106 FE) MG TABS Take 1 tablet by mouth.    . metFORMIN (GLUCOPHAGE-XR) 500 MG 24 hr tablet Take 2 tablets (1,000 mg total) by mouth daily with breakfast. Annual appt is  due must see provider for future refills 180 tablet 3  . acetaminophen (TYLENOL) 500 MG tablet Take 500 mg by mouth as needed.      Marland Kitchen albuterol (PROVENTIL HFA) 108 (90 Base) MCG/ACT inhaler Inhale 2 puffs into the lungs every 6 (six) hours as needed for wheezing. (Patient not taking: Reported on 01/18/2018) 1 Inhaler 0  . ALPRAZolam (XANAX) 0.25 MG tablet Take 0.25 mg by mouth 3 (three) times daily as needed.      Marland Kitchen aspirin 81 MG tablet Take 81 mg by mouth daily.      Marland Kitchen atazanavir (REYATAZ) 300 MG capsule Take 300 mg by mouth daily with breakfast.      . Azelastine-Fluticasone (DYMISTA) 137-50 MCG/ACT SUSP Use as directed 1 spray each side twice per day as needed 23 g 5  . Beclomethasone Dipropionate (QNASL) 80 MCG/ACT AERS Place 2  sprays into the nose daily. 1 Inhaler 1  . Cholecalciferol (VITAMIN D3) 1000 UNITS CAPS Take by mouth daily.      . Diclofenac Sodium (PENNSAID) 2 % SOLN Place 1 application onto the skin 2 (two) times daily. 1 Bottle 3  . Fluticasone-Salmeterol (ADVAIR DISKUS) 250-50 MCG/DOSE AEPB Inhale 1 puff into the lungs 2 (two) times daily. 1 each 11  . glipiZIDE (GLIPIZIDE XL) 5 MG 24 hr tablet Take 1 tablet (5 mg total) by mouth daily with breakfast. 90 tablet 3  . ibuprofen (ADVIL,MOTRIN) 800 MG tablet Take 1 tablet (800 mg total) by mouth every 8 (eight) hours as needed. 60 tablet 0  . losartan (COZAAR) 25 MG tablet Take 1 tablet (25 mg total) by mouth daily. 90 tablet 0  . rosuvastatin (CRESTOR) 40 MG tablet Take 1 tablet (40 mg total) by mouth daily. 90 tablet 3  . traMADol (ULTRAM) 50 MG tablet Take 1 tablet (50 mg total) by mouth every 8 (eight) hours as needed. 60 tablet 0  . valACYclovir (VALTREX) 500 MG tablet Take twice daily for 3-5 days 30 tablet 12   No current facility-administered medications for this visit.     OBJECTIVE: Morbidly obese African-American woman in no acute distress  Vitals:   01/18/18 1302  BP: 137/88  Pulse: 96  Resp: 18  Temp: 98.5 F (36.9 C)  SpO2: 100%     Body mass index is 48.56 kg/m.   Wt Readings from Last 3 Encounters:  01/18/18 265 lb 8 oz (120.4 kg)  11/30/17 253 lb (114.8 kg)  11/28/17 258 lb (117 kg)      ECOG FS:0 - Asymptomatic  Ocular: Sclerae unicteric, pupils round and equal Ear-nose-throat: Oropharynx clear and moist Lymphatic: No cervical or supraclavicular adenopathy Lungs no rales or rhonchi Heart regular rate and rhythm Abd soft, nontender, positive bowel sounds MSK no focal spinal tenderness, no joint edema Neuro: non-focal, well-oriented, appropriate affect Breasts: The right breast is status post recent biopsy.  With the upper arm extended I am able to feel a 1 cm mass deep in the upper outer quadrant, without associated skin  changes, or tenderness.  There are no other findings of concern.  The left breast is benign.  Both axillae are benign.   LAB RESULTS:  CMP     Component Value Date/Time   NA 140 01/18/2018 1227   K 3.7 01/18/2018 1227   CL 102 01/18/2018 1227   CO2 28 01/18/2018 1227   GLUCOSE 140 (H) 01/18/2018 1227   BUN 10 01/18/2018 1227   CREATININE 0.80 01/18/2018 1227   CALCIUM 9.6  01/18/2018 1227   PROT 8.0 01/18/2018 1227   ALBUMIN 3.8 01/18/2018 1227   AST 18 01/18/2018 1227   ALT 18 01/18/2018 1227   ALKPHOS 144 (H) 01/18/2018 1227   BILITOT 0.3 01/18/2018 1227   GFRNONAA >60 01/18/2018 1227   GFRAA >60 01/18/2018 1227    No results found for: TOTALPROTELP, ALBUMINELP, A1GS, A2GS, BETS, BETA2SER, GAMS, MSPIKE, SPEI  No results found for: KPAFRELGTCHN, LAMBDASER, [facility]  Lab Results  Component Value Date   WBC 5.1 01/18/2018   NEUTROABS 2.4 01/18/2018   HGB 13.1 01/18/2018   HCT 40.5 01/18/2018   MCV 84.2 01/18/2018   PLT 313 01/18/2018    @LASTCHEMISTRY @  No results found for: LABCA2  No components found for: ZESPQZ300  No results for input(s): INR in the last 168 hours.  No results found for: LABCA2  No results found for: TMA263  No results found for: FHL456  No results found for: YBW389  No results found for: CA2729  No components found for: HGQUANT  No results found for: CEA1 / No results found for: CEA1   No results found for: AFPTUMOR  No results found for: CHROMOGRNA  No results found for: PSA1  Appointment on 01/18/2018  Component Date Value Ref Range Status  . Sodium 01/18/2018 140  135 - 145 mmol/L Final   Please note reference intervals were recently updated.  . Potassium 01/18/2018 3.7  3.5 - 5.1 mmol/L Final  . Chloride 01/18/2018 102  98 - 111 mmol/L Final  . CO2 01/18/2018 28  22 - 32 mmol/L Final  . Glucose, Bld 01/18/2018 140* 70 - 99 mg/dL Final  . BUN 01/18/2018 10  6 - 20 mg/dL Final   Please note change in reference  range.  . Creatinine 01/18/2018 0.80  0.44 - 1.00 mg/dL Final  . Calcium 01/18/2018 9.6  8.9 - 10.3 mg/dL Final  . Total Protein 01/18/2018 8.0  6.5 - 8.1 g/dL Final  . Albumin 01/18/2018 3.8  3.5 - 5.0 g/dL Final  . AST 01/18/2018 18  15 - 41 U/L Final  . ALT 01/18/2018 18  0 - 44 U/L Final  . Alkaline Phosphatase 01/18/2018 144* 38 - 126 U/L Final  . Total Bilirubin 01/18/2018 0.3  0.3 - 1.2 mg/dL Final  . GFR, Est Non Af Am 01/18/2018 >60  >60 mL/min Final  . GFR, Est AFR Am 01/18/2018 >60  >60 mL/min Final   Comment: (NOTE) The eGFR has been calculated using the CKD EPI equation. This calculation has not been validated in all clinical situations. eGFR's persistently <60 mL/min signify possible Chronic Kidney Disease.   Kelly Rowe gap 01/18/2018 10  5 - 15 Final   Performed at [facility], [place] [address] [place], [place] [address]  . WBC Count 01/18/2018 5.1  3.9 - 10.3 K/uL Final  . RBC 01/18/2018 4.81  3.70 - 5.45 MIL/uL Final  . Hemoglobin 01/18/2018 13.1  11.6 - 15.9 g/dL Final  . HCT 01/18/2018 40.5  34.8 - 46.6 % Final  . MCV 01/18/2018 84.2  79.5 - 101.0 fL Final  . MCH 01/18/2018 27.2  25.1 - 34.0 pg Final  . MCHC 01/18/2018 32.3  31.5 - 36.0 g/dL Final  . RDW 01/18/2018 14.2  11.2 - 14.5 % Final  . Platelet Count 01/18/2018 313  145 - 400 K/uL Final  . Neutrophils Relative % 01/18/2018 48  % Final  . Neutro Abs 01/18/2018 2.4  1.5 - 6.5 K/uL Final  .  Lymphocytes Relative 01/18/2018 45  % Final  . Lymphs Abs 01/18/2018 2.3  0.9 - 3.3 K/uL Final  . Monocytes Relative 01/18/2018 6  % Final  . Monocytes Absolute 01/18/2018 0.3  0.1 - 0.9 K/uL Final  . Eosinophils Relative 01/18/2018 0  % Final  . Eosinophils Absolute 01/18/2018 0.0  0.0 - 0.5 K/uL Final  . Basophils Relative 01/18/2018 1  % Final  . Basophils Absolute 01/18/2018 0.0  0.0 - 0.1 K/uL Final   Performed at [facility], [place] [address], [place], [place]  [address]    (this displays the last labs from the last 3 days)  No results found for: TOTALPROTELP, ALBUMINELP, A1GS, A2GS, BETS, BETA2SER, GAMS, MSPIKE, SPEI (this displays SPEP labs)  No results found for: KPAFRELGTCHN, LAMBDASER, KAPLAMBRATIO (kappa/lambda light chains)  No results found for: HGBA, HGBA2QUANT, HGBFQUANT, HGBSQUAN (Hemoglobinopathy evaluation)   No results found for: LDH  Lab Results  Component Value Date   IRON 35 (L) 01/30/2010   IRONPCTSAT 8.8 (L) 01/30/2010   (Iron and TIBC)  No results found for: FERRITIN  Urinalysis    Component Value Date/Time   COLORURINE YELLOW 09/27/2017 1002   APPEARANCEUR CLEAR 09/27/2017 1002   LABSPEC >=1.030 (A) 09/27/2017 1002   PHURINE 5.5 09/27/2017 1002   GLUCOSEU NEGATIVE 09/27/2017 [place] 09/27/2017 [place] 09/27/2017 1002   [place] 09/27/2017 1002   PROTEINUR NEG 05/15/2014 1433   UROBILINOGEN 0.2 09/27/2017 1002   NITRITE NEGATIVE 09/27/2017 1002   LEUKOCYTESUR NEGATIVE 09/27/2017 1002     STUDIES: US Breast Ltd Uni Right Inc Axilla  Result Date: 01/05/2018 CLINICAL DATA:  Patient describes a palpable lump within the RIGHT breast. EXAM: DIGITAL DIAGNOSTIC RIGHT MAMMOGRAM WITH CAD AND TOMO ULTRASOUND RIGHT BREAST COMPARISON:  Previous exams including most recent bilateral screening mammogram dated 05/16/2017. ACR Breast Density Category b: There are scattered areas of fibroglandular density. FINDINGS: There is a new irregular mass within the upper RIGHT breast, measuring approximately 1.5 cm greatest dimension, corresponding to the area of clinical concern with overlying skin marker in place. There are no new dominant masses, suspicious calcifications or secondary signs of malignancy elsewhere within the RIGHT breast. Mammographic images were processed with CAD. Targeted ultrasound is performed, showing an irregular hypoechoic mass in the RIGHT breast at the 9:30 o'clock  axis, 18 cm from the nipple, measuring 1.8 x 1.1 x 1.5 cm, corresponding to the area of clinical concern. A single morphologically abnormal lymph node is identified in the RIGHT axilla, with cortical thickness of 6 mm. IMPRESSION: 1. Highly suspicious mass within the RIGHT breast at the 9:30 o'clock axis, 18 cm from the nipple, measuring 1.8 cm, corresponding to the site of patient's palpable lump. Ultrasound-guided biopsy is recommended. 2. Single morphologically abnormal lymph node in the RIGHT axilla, with cortical thickness of 6 mm. This is also a suspicious finding for which ultrasound-guided biopsy is recommended. RECOMMENDATION: 1. Ultrasound-guided biopsy of the RIGHT breast mass at the 9:30 o'clock axis. 2. Ultrasound-guided biopsy of the abnormal lymph node in the RIGHT axilla. Ultrasound-guided biopsies are scheduled for June 19th. I have discussed the findings and recommendations with the patient. Results were also provided in writing at the conclusion of the visit. If applicable, a reminder letter will be sent to the patient regarding the next appointment. BI-RADS CATEGORY  4: Suspicious. Electronically Signed   By: Franki Cabot M.D.   On: 01/05/2018 15:11   Mm Diag  Breast Tomo Uni Right  Result Date: 01/05/2018 CLINICAL DATA:  Patient describes a palpable lump within the RIGHT breast. EXAM: DIGITAL DIAGNOSTIC RIGHT MAMMOGRAM WITH CAD AND TOMO ULTRASOUND RIGHT BREAST COMPARISON:  Previous exams including most recent bilateral screening mammogram dated 05/16/2017. ACR Breast Density Category b: There are scattered areas of fibroglandular density. FINDINGS: There is a new irregular mass within the upper RIGHT breast, measuring approximately 1.5 cm greatest dimension, corresponding to the area of clinical concern with overlying skin marker in place. There are no new dominant masses, suspicious calcifications or secondary signs of malignancy elsewhere within the RIGHT breast. Mammographic images were  processed with CAD. Targeted ultrasound is performed, showing an irregular hypoechoic mass in the RIGHT breast at the 9:30 o'clock axis, 18 cm from the nipple, measuring 1.8 x 1.1 x 1.5 cm, corresponding to the area of clinical concern. A single morphologically abnormal lymph node is identified in the RIGHT axilla, with cortical thickness of 6 mm. IMPRESSION: 1. Highly suspicious mass within the RIGHT breast at the 9:30 o'clock axis, 18 cm from the nipple, measuring 1.8 cm, corresponding to the site of patient's palpable lump. Ultrasound-guided biopsy is recommended. 2. Single morphologically abnormal lymph node in the RIGHT axilla, with cortical thickness of 6 mm. This is also a suspicious finding for which ultrasound-guided biopsy is recommended. RECOMMENDATION: 1. Ultrasound-guided biopsy of the RIGHT breast mass at the 9:30 o'clock axis. 2. Ultrasound-guided biopsy of the abnormal lymph node in the RIGHT axilla. Ultrasound-guided biopsies are scheduled for June 19th. I have discussed the findings and recommendations with the patient. Results were also provided in writing at the conclusion of the visit. If applicable, a reminder letter will be sent to the patient regarding the next appointment. BI-RADS CATEGORY  4: Suspicious. Electronically Signed   By: Franki Cabot M.D.   On: 01/05/2018 15:11   [place] Axillary Node Core Biopsy Right  Addendum Date: 01/12/2018   ADDENDUM REPORT: 01/12/2018 13:25 ADDENDUM: Pathology revealed GRADE III -INVASIVE DUCTAL CARCINOMA of RIGHT breast, 9:30 o'clock, ribbon clip. This was found to be concordant by Dr. Nolon Nations. Prior to biopsy, a second very deep axillary lymph node was also imaged but may be difficult to biopsy given its location. Axillary metastatic disease has not been excluded. Therefore consider MRI for further evaluation of the axilla. Pathology revealed THERE IS NO EVIDENCE OF CARCINOMA IN 1 OF 1 LYMPH NODE, right axillary, spiral HydroMARK clip. This was  found to be concordant by Dr. Nolon Nations. Pathology results were discussed with the patient by telephone. The patient reported doing well after the biopsy with tenderness at the site. Post biopsy instructions and care were reviewed and questions were answered. The patient was encouraged to call [facility] for any additional concerns. The patient was referred to [facility] on January 18, 2018. Pathology results reported by Roselind Messier, RN on 01/12/2018. Electronically Signed   By: Nolon Nations M.D.   On: 01/12/2018 13:25   Result Date: 01/12/2018 CLINICAL DATA:  The patient presents for ultrasound-guided core biopsy of a mass in the 9:30 o'clock location of the RIGHT breast and enlarged RIGHT axillary lymph node. EXAM: ULTRASOUND GUIDED RIGHT BREAST CORE NEEDLE BIOPSY ULTRASOUND-GUIDED RIGHT AXILLARY CORE NEEDLE BIOPSY COMPARISON:  Previous exam(s). FINDINGS: I met with the patient and we discussed the procedure of ultrasound-guided biopsy, including benefits and alternatives. We discussed the high likelihood of a successful procedure.  We discussed the risks of the procedure, including infection, bleeding, tissue injury, clip migration, and inadequate sampling. Informed written consent was given. The usual time-out protocol was performed immediately prior to the procedure. Prior to the biopsy ultrasound is performed of the RIGHT breast and axilla. In addition to the previously identified prominent RIGHT axillary lymph node, a second enlarged lymph node is identified very deep in the axilla, measuring 2.10 x 1.5 x 1.8 centimeters, and lacking fatty hilum. Lesion 1, ribbon shaped clip.  Lesion quadrant: UPPER OUTER QUADRANT Using sterile technique and 1% Lidocaine as local anesthetic, under direct ultrasound visualization, a 12 gauge spring-loaded device was used to perform biopsy of mass in the 9:30 o'clock  location of the RIGHT breast using a LATERAL to MEDIAL approach. At the conclusion of the procedure a ribbon shaped tissue marker clip was deployed into the biopsy cavity. Lesion 2, spiral shaped clip.  LEFT axilla: Using sterile technique and 1% lidocaine as local anesthetic, under direct ultrasound visualization a 14 gauge spring-loaded device was used to perform biopsy of enlarged LOWER RIGHT axillary lymph node using a LATERAL to MEDIAL approach. At the conclusion of procedure a spiral shaped tissue marker clip was deployed into the biopsy cavity. Follow up 2 view mammogram was performed and dictated separately. IMPRESSION: 1. Ultrasound guided biopsy of mass in the 9:30 o'clock location of the RIGHT breast. 2. Ultrasound-guided core biopsy of enlarged RIGHT axillary lymph node. 3. A second enlarged RIGHT axillary lymph node is imaged prior to biopsy. 4. No apparent complications. Electronically Signed: By: Nolon Nations M.D. On: 01/11/2018 14:46   Mm Clip Placement Right  Addendum Date: 01/12/2018   ADDENDUM REPORT: 01/12/2018 12:32 ADDENDUM: Corrected report: A spiral shaped clip is identified within the LOWER RIGHT axillary lymph node following biopsy. Electronically Signed   By: Nolon Nations M.D.   On: 01/12/2018 12:32   Result Date: 01/12/2018 CLINICAL DATA:  Status post ultrasound-guided core biopsy of mass in the 9:30 o'clock location of the RIGHT breast and enlarged RIGHT axillary node. EXAM: DIAGNOSTIC RIGHT MAMMOGRAM POST ULTRASOUND BIOPSY x2 COMPARISON:  01/05/2018 and earlier FINDINGS: Mammographic images were obtained following ultrasound guided biopsy of mass in the 9:30 o'clock location of the RIGHT breast. A ribbon shaped clip is identified within the mass in the is UPPER OUTER QUADRANT of the RIGHT breast. A spiral shaped clip is identified within the LOWER LEFT axillary lymph node following biopsy appear IMPRESSION: Tissue marker clips are in the expected locations after biopsy.  Final Assessment: Post Procedure Mammograms for Marker Placement Electronically Signed: By: Nolon Nations M.D. On: 01/11/2018 14:47   [place] Rt Breast Bx W Loc Dev 1st Lesion Img Bx Spec US Guide  Addendum Date: 01/12/2018   ADDENDUM REPORT: 01/12/2018 13:25 ADDENDUM: Pathology revealed GRADE III -INVASIVE DUCTAL CARCINOMA of RIGHT breast, 9:30 o'clock, ribbon clip. This was found to be concordant by Dr. Nolon Nations. Prior to biopsy, a second very deep axillary lymph node was also imaged but may be difficult to biopsy given its location. Axillary metastatic disease has not been excluded. Therefore consider MRI for further evaluation of the axilla. Pathology revealed THERE IS NO EVIDENCE OF CARCINOMA IN 1 OF 1 LYMPH NODE, right axillary, spiral HydroMARK clip. This was found to be concordant by Dr. Nolon Nations. Pathology results were discussed with the patient by telephone. The patient reported doing well after the biopsy with tenderness at the site. Post biopsy instructions and care were reviewed and questions were answered.  The patient was encouraged to call [facility] for any additional concerns. The patient was referred to [facility] at [facility] on January 18, 2018. Pathology results reported by Roselind Messier, RN on 01/12/2018. Electronically Signed   By: Nolon Nations M.D.   On: 01/12/2018 13:25   Result Date: 01/12/2018 CLINICAL DATA:  The patient presents for ultrasound-guided core biopsy of a mass in the 9:30 o'clock location of the RIGHT breast and enlarged RIGHT axillary lymph node. EXAM: ULTRASOUND GUIDED RIGHT BREAST CORE NEEDLE BIOPSY ULTRASOUND-GUIDED RIGHT AXILLARY CORE NEEDLE BIOPSY COMPARISON:  Previous exam(s). FINDINGS: I met with the patient and we discussed the procedure of ultrasound-guided biopsy, including benefits and alternatives. We discussed the high likelihood of a successful procedure.  We discussed the risks of the procedure, including infection, bleeding, tissue injury, clip migration, and inadequate sampling. Informed written consent was given. The usual time-out protocol was performed immediately prior to the procedure. Prior to the biopsy ultrasound is performed of the RIGHT breast and axilla. In addition to the previously identified prominent RIGHT axillary lymph node, a second enlarged lymph node is identified very deep in the axilla, measuring 2.10 x 1.5 x 1.8 centimeters, and lacking fatty hilum. Lesion 1, ribbon shaped clip.  Lesion quadrant: UPPER OUTER QUADRANT Using sterile technique and 1% Lidocaine as local anesthetic, under direct ultrasound visualization, a 12 gauge spring-loaded device was used to perform biopsy of mass in the 9:30 o'clock location of the RIGHT breast using a LATERAL to MEDIAL approach. At the conclusion of the procedure a ribbon shaped tissue marker clip was deployed into the biopsy cavity. Lesion 2, spiral shaped clip.  LEFT axilla: Using sterile technique and 1% lidocaine as local anesthetic, under direct ultrasound visualization a 14 gauge spring-loaded device was used to perform biopsy of enlarged LOWER RIGHT axillary lymph node using a LATERAL to MEDIAL approach. At the conclusion of procedure a spiral shaped tissue marker clip was deployed into the biopsy cavity. Follow up 2 view mammogram was performed and dictated separately. IMPRESSION: 1. Ultrasound guided biopsy of mass in the 9:30 o'clock location of the RIGHT breast. 2. Ultrasound-guided core biopsy of enlarged RIGHT axillary lymph node. 3. A second enlarged RIGHT axillary lymph node is imaged prior to biopsy. 4. No apparent complications. Electronically Signed: By: Nolon Nations M.D. On: 01/11/2018 14:46    ELIGIBLE FOR AVAILABLE RESEARCH PROTOCOL: no  ASSESSMENT: 47 y.o. [place], [name] woman status post right breast upper outer quadrant biopsy 01/11/2018 for a clinical T1 N0, stage IA  invasive ductal carcinoma, grade 3, estrogen and progesterone receptor positive, HER-2 amplified, with an MIB-1 of 30%.  (1) definitive surgery pending  (2) adjuvant chemotherapy to follow  (3) anti-HER-2 immunotherapy to start concurrently with chemotherapy  (4) adjuvant radiation as appropriate  (5) antiestrogens to follow at the completion of local treatment  (6) genetics testing scheduled for 01/19/2018  PLAN: We spent the better part of today's hour-long appointment discussing the biology of her diagnosis and the specifics of her situation. We first reviewed the fact that cancer is not one disease but more than 100 different diseases and that it is important to keep them separate-- otherwise when friends and relatives discuss their own cancer experiences with Kelly Rowe confusion can result. Similarly we explained that if breast cancer spreads to the bone or liver, the patient would not have bone cancer or liver cancer, but breast cancer in the bone and  breast cancer in the liver: one cancer in three places-- not 3 different cancers which otherwise would have to be treated in 3 different ways.  We discussed the difference between local and systemic therapy. In terms of loco-regional treatment, lumpectomy plus radiation is equivalent to mastectomy as far as survival is concerned. For this reason, and because the cosmetic results are generally superior, we recommend breast conserving surgery to be eventually followed by radiation  We then discussed the rationale for systemic therapy. There is some risk that this cancer may have already spread to other parts of her body. Patients frequently ask at this point about bone scans, CAT scans and PET scans to find out if they have occult breast cancer somewhere else. The problem is that in early stage disease we are much more likely to find false positives then true cancers and this would expose the patient to unnecessary procedures as well as  unnecessary radiation. Scans cannot answer the question the patient really would like to know, which is whether she has microscopic disease elsewhere in her body. For those reasons we do not recommend them.  Of course we would proceed to aggressive evaluation of any symptoms that might suggest metastatic disease, but that is not the case here.  Next we went over the options for systemic therapy which are anti-estrogens, anti-HER-2 immunotherapy, and chemotherapy. Shalanda meets criteria for all 3, and that is particularly beneficial, since she will have a much better chance of cure than the patient who can only receive 1 or 2 or none of these treatments.  The specific chemotherapy she will receive will depend on the final pathology report.  We also discussed genetics. In patients who carry a deleterious mutation [for example in a  BRCA gene], the risk of a new breast cancer developing in the future may be sufficiently great that the patient may choose bilateral mastectomies. However if she wishes to keep her breasts in that situation it is safe to do so. That would require intensified screening, which generally means not only yearly mammography but a yearly breast MRI as well. Of course, if there is a deleterious mutation bilateral oophorectomy would be necessary as there is no standard screening protocol for ovarian cancer.  Quenisha has a good understanding of the overall plan. She agrees with it. She knows the goal of treatment in her case is cure. She will call with any problems that may develop before her next visit here.  Chauncey Cruel, MD   01/18/2018 3:19 PM Medical Oncology and Hematology [facility] [address] [place], [place] [address] Tel. [phone]    Fax. [phone]

## 2018-01-18 ENCOUNTER — Other Ambulatory Visit: Payer: Self-pay

## 2018-01-18 ENCOUNTER — Inpatient Hospital Stay: Payer: BC Managed Care – PPO

## 2018-01-18 ENCOUNTER — Other Ambulatory Visit: Payer: Self-pay | Admitting: *Deleted

## 2018-01-18 ENCOUNTER — Inpatient Hospital Stay: Payer: BC Managed Care – PPO | Attending: Oncology | Admitting: Oncology

## 2018-01-18 ENCOUNTER — Encounter: Payer: Self-pay | Admitting: Radiation Oncology

## 2018-01-18 ENCOUNTER — Ambulatory Visit: Payer: BC Managed Care – PPO | Attending: General Surgery | Admitting: Physical Therapy

## 2018-01-18 ENCOUNTER — Ambulatory Visit
Admission: RE | Admit: 2018-01-18 | Discharge: 2018-01-18 | Disposition: A | Discharge status: Home / Self Care | Payer: BC Managed Care – PPO | Source: Ambulatory Visit | Attending: Radiation Oncology | Admitting: Radiation Oncology

## 2018-01-18 ENCOUNTER — Encounter: Payer: Self-pay | Admitting: Physical Therapy

## 2018-01-18 ENCOUNTER — Encounter: Payer: Self-pay | Admitting: Oncology

## 2018-01-18 VITALS — BP 137/88 | HR 96 | Temp 98.5°F | Resp 18 | Ht 62.0 in | Wt 265.5 lb

## 2018-01-18 DIAGNOSIS — Z170 Estrogen receptor positive status [ER+]: Secondary | ICD-10-CM | POA: Insufficient documentation

## 2018-01-18 DIAGNOSIS — C50411 Malignant neoplasm of upper-outer quadrant of right female breast: Secondary | ICD-10-CM

## 2018-01-18 DIAGNOSIS — R293 Abnormal posture: Secondary | ICD-10-CM | POA: Insufficient documentation

## 2018-01-18 DIAGNOSIS — Z9221 Personal history of antineoplastic chemotherapy: Secondary | ICD-10-CM

## 2018-01-18 DIAGNOSIS — E119 Type 2 diabetes mellitus without complications: Secondary | ICD-10-CM | POA: Insufficient documentation

## 2018-01-18 DIAGNOSIS — Z923 Personal history of irradiation: Secondary | ICD-10-CM | POA: Diagnosis not present

## 2018-01-18 LAB — CMP (CANCER CENTER ONLY)
ALT: 18 U/L (ref 0–44)
AST: 18 U/L (ref 15–41)
Albumin: 3.8 g/dL (ref 3.5–5.0)
Alkaline Phosphatase: 144 U/L — ABNORMAL HIGH (ref 38–126)
Anion gap: 10 (ref 5–15)
BUN: 10 mg/dL (ref 6–20)
CO2: 28 mmol/L (ref 22–32)
Calcium: 9.6 mg/dL (ref 8.9–10.3)
Chloride: 102 mmol/L (ref 98–111)
Creatinine: 0.8 mg/dL (ref 0.44–1.00)
GFR, Est AFR Am: >60 mL/min (ref >60)
GFR, Estimated: >60 mL/min (ref >60)
Glucose, Bld: 140 mg/dL — ABNORMAL HIGH (ref 70–99)
Potassium: 3.7 mmol/L (ref 3.5–5.1)
Sodium: 140 mmol/L (ref 135–145)
Total Bilirubin: 0.3 mg/dL (ref 0.3–1.2)
Total Protein: 8 g/dL (ref 6.5–8.1)

## 2018-01-18 LAB — CBC WITH DIFFERENTIAL (CANCER CENTER ONLY)
Basophils Absolute: 0 10*3/uL (ref 0.0–0.1)
Basophils Relative: 1 %
Eosinophils Absolute: 0 10*3/uL (ref 0.0–0.5)
Eosinophils Relative: 0 %
HCT: 40.5 % (ref 34.8–46.6)
Hemoglobin: 13.1 g/dL (ref 11.6–15.9)
Lymphocytes Relative: 45 %
Lymphs Abs: 2.3 10*3/uL (ref 0.9–3.3)
MCH: 27.2 pg (ref 25.1–34.0)
MCHC: 32.3 g/dL (ref 31.5–36.0)
MCV: 84.2 fL (ref 79.5–101.0)
Monocytes Absolute: 0.3 10*3/uL (ref 0.1–0.9)
Monocytes Relative: 6 %
Neutro Abs: 2.4 10*3/uL (ref 1.5–6.5)
Neutrophils Relative %: 48 %
Platelet Count: 313 10*3/uL (ref 145–400)
RBC: 4.81 MIL/uL (ref 3.70–5.45)
RDW: 14.2 % (ref 11.2–14.5)
WBC Count: 5.1 10*3/uL (ref 3.9–10.3)

## 2018-01-18 NOTE — Therapy (Signed)
[place], [name], [address] Phone: [phone]   Fax:  [phone]  Physical Therapy Evaluation  Patient Details  Name: Kelly Rowe MRN: [id number] Date of Birth: 25-Oct-1970 Referring Provider: Dr. Stark Klein   Encounter Date: 01/18/2018  Kelly Rowe End of Session - 01/18/18 1528    Visit Number  1    Number of Visits  2    Date for Kelly Rowe Re-Evaluation  03/15/18    Kelly Rowe Start Time  8416    Kelly Rowe Stop Time  6063 Also saw Kelly Rowe from 0160-1093 for a total of 37 minutes    Kelly Rowe Time Calculation (min)  17 min    Activity Tolerance  Patient tolerated treatment well    Behavior During Therapy  [facility] for tasks assessed/performed       Past Medical History:  Diagnosis Date  . ANEMIA-IRON DEFICIENCY 01/30/2010  . ANXIETY 11/27/2007  . Arthritis    both knees  . Asthma 02/25/2011  . Carbuncle and furuncle of trunk 04/16/2010  . Chlamydia infection 03/21/2008  . DIABETES MELLITUS, TYPE II 08/02/2007  . Edema 01/30/2010  . ELEVATED BLOOD PRESSURE WITHOUT DIAGNOSIS OF HYPERTENSION 08/02/2007  . FREQUENCY, URINARY 05/19/2009  . GENITAL HERPES 03/12/2009  . HIV (human immunodeficiency virus infection) ([place]) 2009  . HIV INFECTION 03/12/2009  . HSV (herpes simplex virus) infection   . HYPERLIPIDEMIA 08/02/2007  . Metrorrhagia 03/21/2008  . RETENTION, URINE 05/19/2009  . Trichomonas infection 01/19/2010  . VITAMIN D DEFICIENCY 01/30/2010    Past Surgical History:  Procedure Laterality Date  . ABDOMINAL HYSTERECTOMY  07/22/2010   TAH WITH PRESERVATION OF BOTH TUBES AND OVARIES  . CHOLECYSTECTOMY    . ENDOMETRIAL ABLATION  01/11/2008   HER OPTION  . TUBAL LIGATION      There were no vitals filed for this visit.   Subjective Assessment - 01/18/18 1502    Subjective  Patient reports she is here today to be seen by her medical team for her new diagnosed right breast cancer.    Patient is accompained by:  Family member    Pertinent History  Patient was diagnosed on 01/05/18 with right triple positive breast cancer. It measures 1.8 cm and is located in the upper outer quadrant. The Ki67 is 30%. Medical problems include diabetes and HIV.    Patient Stated Goals  Reduce lymphedema risk and learn post op shoulder ROM HEP    Currently in Pain?  No/denies         [facility] Kelly Rowe Assessment - 01/18/18 0001      Assessment   Medical Diagnosis  Right breast cancer    Referring Provider  Dr. Stark Klein    Onset Date/Surgical Date  01/05/18    Hand Dominance  Right    Prior Therapy  none      Precautions   Precautions  Other (comment)    Precaution Comments  active cancer      Restrictions   Weight Bearing Restrictions  No      Balance Screen   Has the patient fallen in the past 6 months  No    Has the patient had a decrease in activity level because of a fear of falling?   No    Is the patient reluctant to leave their home because of a fear of falling?   No      Home Film[place]video [place] residence    Living Arrangements  Children 2  adult sons and 28 y.o. grandson    Available Help at Discharge  Family      Prior Function   Level of Independence  Independent    Vocation  Full time employment    Pensions consultant on weekends; school bus driver    Leisure  She does not exercise      Cognition   Overall Cognitive Status  Within Functional Limits for tasks assessed      Posture/Postural Control   Posture/Postural Control  Postural limitations    Postural Limitations  Rounded Shoulders;Forward head      ROM / Strength   AROM / PROM / Strength  AROM;Strength      AROM   AROM Assessment Site  Shoulder;Cervical    Right/Left Shoulder  Right;Left    Right Shoulder Extension  50 Degrees    Right Shoulder Flexion  140 Degrees    Right Shoulder ABduction  136 Degrees    Right Shoulder Internal Rotation  68 Degrees    Right Shoulder External Rotation  80 Degrees    Left  Shoulder Extension  49 Degrees    Left Shoulder Flexion  148 Degrees    Left Shoulder ABduction  145 Degrees    Left Shoulder Internal Rotation  63 Degrees    Left Shoulder External Rotation  76 Degrees    Cervical Flexion  WNL    Cervical Extension  WNL    Cervical - Right Side Bend  WNL    Cervical - Left Side Bend  WNL    Cervical - Right Rotation  WNL    Cervical - Left Rotation  WNL      Strength   Overall Strength  Within functional limits for tasks performed        LYMPHEDEMA/ONCOLOGY QUESTIONNAIRE - 01/18/18 1527      Type   Cancer Type  Right breast cancer      Lymphedema Assessments   Lymphedema Assessments  Upper extremities      Right Upper Extremity Lymphedema   10 cm Proximal to Olecranon Process  37.2 cm    Olecranon Process  28.2 cm    10 cm Proximal to Ulnar Styloid Process  26.3 cm    Just Proximal to Ulnar Styloid Process  17 cm    Across Hand at [employer]  19.7 cm    At [place] of 2nd Digit  6.3 cm      Left Upper Extremity Lymphedema   10 cm Proximal to Olecranon Process  36 cm    Olecranon Process  27.2 cm    10 cm Proximal to Ulnar Styloid Process  25.7 cm    Just Proximal to Ulnar Styloid Process  16.9 cm    Across Hand at [employer]  19 cm    At [place] of 2nd Digit  6.1 cm             Objective measurements completed on examination: See above findings.     Patient was instructed today in a home exercise program today for post op shoulder range of motion. These included active assist shoulder flexion in sitting, scapular retraction, wall walking with shoulder abduction, and hands behind head external rotation.  She was encouraged to do these twice a day, holding 3 seconds and repeating 5 times when permitted by her physician.     Kelly Rowe Education - 01/18/18 1528    Education Details  Lymphedema risk reduction and post op shoulder ROM HEP  Person(s) Educated  Patient    Methods  Explanation;Demonstration;Handout    Comprehension   Returned demonstration;Verbalized understanding          Kelly Rowe Long Term Goals - 01/18/18 1543      Kelly Rowe LONG TERM GOAL #1   Title  Patient will demonstrate she has returned ot baseline related to shoulder ROM and function.    Time  [facility] Goals - 01/18/18 1543      Patient will be able to verbalize understanding of pertinent lymphedema risk reduction practices relevant to her diagnosis specifically related to skin care.   Time  1    Period  Days    Status  Achieved      Patient will be able to return demonstrate and/or verbalize understanding of the post-op home exercise program related to regaining shoulder range of motion.   Time  1    Period  Days    Status  Achieved      Patient will be able to verbalize understanding of the importance of attending the postoperative After Breast Cancer Class for further lymphedema risk reduction education and therapeutic exercise.   Time  1    Period  Days    Status  Achieved            Plan - 01/18/18 1531    Clinical Impression Statement  Patient was diagnosed on 01/05/18 with right triple positive breast cancer. It measures 1.8 cm and is located in the upper outer quadrant. The Ki67 is 30%. Medical problems include diabetes and HIV. Her multidisciplinary medical team met prior to her assessments to determine a recommended treatment plan. She is planning to have a right lumpectomy and sentinel node biopsy followed by chemotherapy, radiation, and anti-estrogen therapy. She will benefit from a post op Kelly Rowe visit to determine needs.    History and Personal Factors relevant to plan of care:  HIV, diabetes    Clinical Presentation  Stable    Clinical Decision Making  Low    Rehab Potential  Excellent    Clinical Impairments Affecting Rehab Potential  None    Kelly Rowe Frequency  -- Eval and 1 f/u visit    Kelly Rowe Treatment/Interventions  ADLs/Self Care Home Management;Therapeutic exercise;Patient/family  education    Kelly Rowe Next Visit Plan  Will reassess 3-4 weeks post op    Kelly Rowe Home Exercise Plan  Post op shoulder ROM HEP    Consulted and Agree with Plan of Care  Patient;Family member/caregiver    Family Member Consulted  Multiple family members including daughter and mother and 3 others       Patient will benefit from skilled therapeutic intervention in order to improve the following deficits and impairments:  Decreased knowledge of precautions, Impaired UE functional use, Decreased range of motion, Postural dysfunction, Pain  Visit Diagnosis: Malignant neoplasm of upper-outer quadrant of right breast in female, estrogen receptor positive ([place]) - Plan: Kelly Rowe plan of care cert/re-cert  Abnormal posture - Plan: Kelly Rowe plan of care cert/re-cert   Patient will follow up at outpatient cancer rehab 3-4 weeks following surgery.  If the patient requires physical therapy at that time, a specific plan will be dictated and sent to the referring physician for approval. The patient was educated today on appropriate basic range of motion exercises to begin post operatively and the importance of attending the After Breast Cancer class following surgery.  Patient  was educated today on lymphedema risk reduction practices as it pertains to recommendations that will benefit the patient immediately following surgery.  She verbalized good understanding.    Problem List Patient Active Problem List   Diagnosis Date Noted  . Diabetes mellitus type 2 without retinopathy ([place]) 01/18/2018  . Morbid obesity ([place]) 01/18/2018  . Malignant neoplasm of upper-outer quadrant of right breast in female, estrogen receptor positive ([place]) 01/17/2018  . Achilles tendinitis 11/28/2017  . Hot flashes 09/30/2017  . Hypertension 09/08/2017  . Contusion of left knee and lower leg 11/18/2015  . Mild intermittent asthma 08/15/2013  . BV (bacterial vaginosis) 08/11/2012  . Right knee pain 07/05/2012  . GERD (gastroesophageal reflux disease)  07/05/2012  . GC (gonococcus) 05/30/2012  . Chlamydia 05/30/2012  . Left knee pain 01/12/2012  . Cough 11/01/2011  . Encounter for long-term (current) use of high-risk medication 02/25/2011  . Preventative health care 02/14/2011  . VITAMIN D DEFICIENCY 01/30/2010  . ANEMIA-IRON DEFICIENCY 01/30/2010  . Edema 01/30/2010  . HIV (human immunodeficiency virus infection) ([place]) 03/12/2009  . GENITAL HERPES 03/12/2009  . Anxiety state 11/27/2007  . Diabetes ([place]) 08/02/2007  . Hyperlipidemia 08/02/2007    Kelly Rowe, Kelly Rowe 01/18/18 3:45 PM  [place], [name], [address] Phone: [phone]   Fax:  [phone]  Kelly Rowe MRN: [id number] Date of Birth: 1971-01-24

## 2018-01-18 NOTE — Progress Notes (Signed)
error 

## 2018-01-18 NOTE — Patient Instructions (Signed)

## 2018-01-18 NOTE — Progress Notes (Signed)
Radiation Oncology         [phone] ________________________________  Initial outpatient Consultation  Name: Kelly Rowe MRN: [id number]  Date: 01/18/2018  DOB: 02/11/1971  CC:John, James W, MD  Byerly, Faera, MD   REFERRING PHYSICIAN: Byerly, Faera, MD  DIAGNOSIS:    ICD-10-CM   1. Malignant neoplasm of upper-outer quadrant of right breast in female, estrogen receptor positive (HCC) C50.411    Z17.0   Cancer Staging Malignant neoplasm of upper-outer quadrant of right breast in female, estrogen receptor positive (HCC) Staging form: Breast, AJCC 8th Edition - Clinical stage from 01/18/2018: Stage IA (cT1c, cN0, cM0, G3, ER+, PR+, HER2+) - Unsigned  CHIEF COMPLAINT: Here to discuss management of her right breast cancer  HISTORY OF PRESENT ILLNESS::Kelly Rowe is a 47 y.o. female who presented with breast abnormality on the following imaging: unilateral rightdiagnostic mammography with tomography and right breast ultrasonography at [facility] on 01/05/2018 showing: breast density category B. There was an irregular highly suspicious mass within the right breast at the 9:30 o'clock upper outer quadrant, measuring 1.8 x 1.1 x 1.5 cm, and located 18 cm from the nipple,  Ultrasonography revealed a single morphologically abnormal lymph node in the RIGHT axilla, with cortical thickness of 6 mm.   On 01/11/2018 she proceeded to biopsy of the right breast area in question. The pathology from this procedure showed ([id number]): Invasive ductal carcinoma grade III. One lymph node biopsied was negative for carcinoma (concordant). Prognostic indicators significant for: estrogen receptor, 60% positive with weak staining intensity and progesterone receptor, 10% positive with strong staining intensity. Proliferation marker Ki67 at 30%. HER2 +.  On review of systems, the patient reports 7 as age of first menstrual period. She is no longer having periods (since 06/25/10). She has not  used hormone replacement. She has carried 5 children to term, the first at 13 years old. She used birth control from 1990 - 1993. She reports night sweats, dribbling urine, breast lump with pain, arthritis, diabetes, hot flashes, and HIV.  PREVIOUS RADIATION THERAPY: No  PAST MEDICAL HISTORY:  has a past medical history of ANEMIA-IRON DEFICIENCY (01/30/2010), ANXIETY (11/27/2007), Arthritis, Asthma (02/25/2011), Carbuncle and furuncle of trunk (04/16/2010), Chlamydia infection (03/21/2008), DIABETES MELLITUS, TYPE II (08/02/2007), Edema (01/30/2010), ELEVATED BLOOD PRESSURE WITHOUT DIAGNOSIS OF HYPERTENSION (08/02/2007), FREQUENCY, URINARY (05/19/2009), GENITAL HERPES (03/12/2009), HIV (human immunodeficiency virus infection) (HCC) (2009), HIV INFECTION (03/12/2009), HSV (herpes simplex virus) infection, HYPERLIPIDEMIA (08/02/2007), Metrorrhagia (03/21/2008), RETENTION, URINE (05/19/2009), Trichomonas infection (01/19/2010), and VITAMIN D DEFICIENCY (01/30/2010).    PAST SURGICAL HISTORY: Past Surgical History:  Procedure Laterality Date  . ABDOMINAL HYSTERECTOMY  07/22/2010   TAH WITH PRESERVATION OF BOTH TUBES AND OVARIES  . CHOLECYSTECTOMY    . ENDOMETRIAL ABLATION  01/11/2008   HER OPTION  . TUBAL LIGATION      FAMILY HISTORY: family history includes Breast cancer in her paternal aunt and paternal aunt; Breast cancer (age of onset: 72) in her maternal uncle; Cancer in her father; Colon cancer in her paternal grandfather; Diabetes in her father and mother; Heart disease in her other; Hypertension in her mother; Lung cancer in her maternal grandmother and paternal aunt; Prostate cancer in her other; Stroke in her other and paternal uncle.  SOCIAL HISTORY:  reports that she has never smoked. She has never used smokeless tobacco. She reports that she drinks alcohol. She reports that she does not use drugs.  ALLERGIES: Levaquin [levofloxacin in d5w] and Penicillins  MEDICATIONS:  Current Outpatient Medications      Medication Sig Dispense Refill  . acetaminophen (TYLENOL) 500 MG tablet Take 500 mg by mouth as needed.      Marland Kitchen albuterol (PROVENTIL HFA) 108 (90 Base) MCG/ACT inhaler Inhale 2 puffs into the lungs every 6 (six) hours as needed for wheezing. (Patient not taking: Reported on 01/18/2018) 1 Inhaler 0  . ALPRAZolam (XANAX) 0.25 MG tablet Take 0.25 mg by mouth 3 (three) times daily as needed.      Marland Kitchen aspirin 81 MG tablet Take 81 mg by mouth daily.      Marland Kitchen atazanavir (REYATAZ) 300 MG capsule Take 300 mg by mouth daily with breakfast.      . Azelastine-Fluticasone (DYMISTA) 137-50 MCG/ACT SUSP Use as directed 1 spray each side twice per day as needed 23 g 5  . Beclomethasone Dipropionate (QNASL) 80 MCG/ACT AERS Place 2 sprays into the nose daily. 1 Inhaler 1  . blood glucose meter kit and supplies Dispense based on patient and insurance preference. Use up to four times daily as directed. (FOR ICD-10 E10.9, E11.9). 1 each 0  . Cholecalciferol (VITAMIN D3) 1000 UNITS CAPS Take by mouth daily.      . Diclofenac Sodium (PENNSAID) 2 % SOLN Place 1 application onto the skin 2 (two) times daily. 1 Bottle 3  . Dulaglutide (TRULICITY) 4.07 WK/0.8UP SOPN Inject 0.75 mg into the skin once a week. 6 mL 11  . elvitegravir-cobicistat-emtricitabine-tenofovir (STRIBILD) 150-150-200-300 MG TABS tablet Take 1 tablet daily by mouth.    . ferrous fumarate (HEMOCYTE - 106 MG FE) 325 (106 FE) MG TABS Take 1 tablet by mouth.    . Fluticasone-Salmeterol (ADVAIR DISKUS) 250-50 MCG/DOSE AEPB Inhale 1 puff into the lungs 2 (two) times daily. 1 each 11  . glipiZIDE (GLIPIZIDE XL) 5 MG 24 hr tablet Take 1 tablet (5 mg total) by mouth daily with breakfast. 90 tablet 3  . ibuprofen (ADVIL,MOTRIN) 800 MG tablet Take 1 tablet (800 mg total) by mouth every 8 (eight) hours as needed. 60 tablet 0  . losartan (COZAAR) 25 MG tablet Take 1 tablet (25 mg total) by mouth daily. 90 tablet 0  . metFORMIN (GLUCOPHAGE-XR) 500 MG 24 hr tablet Take 2  tablets (1,000 mg total) by mouth daily with breakfast. Annual appt is due must see provider for future refills 180 tablet 3  . rosuvastatin (CRESTOR) 40 MG tablet Take 1 tablet (40 mg total) by mouth daily. 90 tablet 3  . traMADol (ULTRAM) 50 MG tablet Take 1 tablet (50 mg total) by mouth every 8 (eight) hours as needed. 60 tablet 0  . valACYclovir (VALTREX) 500 MG tablet Take twice daily for 3-5 days 30 tablet 12   No current facility-administered medications for this encounter.     REVIEW OF SYSTEMS: A 10+ POINT REVIEW OF SYSTEMS WAS OBTAINED including neurology, dermatology, psychiatry, cardiac, respiratory, lymph, extremities, GI, GU, Musculoskeletal, constitutional, breasts, reproductive, HEENT.  All pertinent positives are noted in the HPI.  All others are negative.   PHYSICAL EXAM:   General: Alert and oriented, in no acute distress HEENT: Head is normocephalic. Extraocular movements are intact. Oropharynx is clear. Neck: Neck is supple, no palpable cervical or supraclavicular lymphadenopathy. Heart: Regular in rate and rhythm with no murmurs, rubs, or gallops. Chest: Clear to auscultation bilaterally, with no rhonchi, wheezes, or rales. Abdomen: Soft, nontender, nondistended, with no rigidity or guarding. Extremities: No cyanosis or edema. Lymphatics: see Neck Exam Skin: No concerning lesions. Musculoskeletal: symmetric strength and muscle tone throughout. Neurologic: Cranial nerves II  through XII are grossly intact. No obvious focalities. Speech is fluent. Coordination is intact. Psychiatric: Judgment and insight are intact. Affect is appropriate. Breasts: 2cm mass, UOQ, right breast . No other palpable masses appreciated in the breasts or axillae bilaterally .  ECOG = 0  0 - Asymptomatic (Fully active, able to carry on all predisease activities without restriction)  1 - Symptomatic but completely ambulatory (Restricted in physically strenuous activity but ambulatory and able  to carry out work of a light or sedentary nature. For example, light housework, office work)  2 - Symptomatic, <50% in bed during the day (Ambulatory and capable of all self care but unable to carry out any work activities. Up and about more than 50% of waking hours)  3 - Symptomatic, >50% in bed, but not bedbound (Capable of only limited self-care, confined to bed or chair 50% or more of waking hours)  4 - Bedbound (Completely disabled. Cannot carry on any self-care. Totally confined to bed or chair)  5 - Death   Oken MM, Creech RH, Tormey DC, et al. (1982). "Toxicity and response criteria of the Eastern Cooperative Oncology Group". Am. J. Clin. Oncol. 5 (6): 649-55   LABORATORY DATA:  Lab Results  Component Value Date   WBC 5.1 01/18/2018   HGB 13.1 01/18/2018   HCT 40.5 01/18/2018   MCV 84.2 01/18/2018   PLT 313 01/18/2018   CMP     Component Value Date/Time   NA 140 01/18/2018 1227   K 3.7 01/18/2018 1227   CL 102 01/18/2018 1227   CO2 28 01/18/2018 1227   GLUCOSE 140 (H) 01/18/2018 1227   BUN 10 01/18/2018 1227   CREATININE 0.80 01/18/2018 1227   CALCIUM 9.6 01/18/2018 1227   PROT 8.0 01/18/2018 1227   ALBUMIN 3.8 01/18/2018 1227   AST 18 01/18/2018 1227   ALT 18 01/18/2018 1227   ALKPHOS 144 (H) 01/18/2018 1227   BILITOT 0.3 01/18/2018 1227   GFRNONAA >60 01/18/2018 1227   GFRAA >60 01/18/2018 1227         RADIOGRAPHY: Us Breast Ltd Uni Right Inc Axilla  Result Date: 01/05/2018 CLINICAL DATA:  Patient describes a palpable lump within the RIGHT breast. EXAM: DIGITAL DIAGNOSTIC RIGHT MAMMOGRAM WITH CAD AND TOMO ULTRASOUND RIGHT BREAST COMPARISON:  Previous exams including most recent bilateral screening mammogram dated 05/16/2017. ACR Breast Density Category b: There are scattered areas of fibroglandular density. FINDINGS: There is a new irregular mass within the upper RIGHT breast, measuring approximately 1.5 cm greatest dimension, corresponding to the area of  clinical concern with overlying skin marker in place. There are no new dominant masses, suspicious calcifications or secondary signs of malignancy elsewhere within the RIGHT breast. Mammographic images were processed with CAD. Targeted ultrasound is performed, showing an irregular hypoechoic mass in the RIGHT breast at the 9:30 o'clock axis, 18 cm from the nipple, measuring 1.8 x 1.1 x 1.5 cm, corresponding to the area of clinical concern. A single morphologically abnormal lymph node is identified in the RIGHT axilla, with cortical thickness of 6 mm. IMPRESSION: 1. Highly suspicious mass within the RIGHT breast at the 9:30 o'clock axis, 18 cm from the nipple, measuring 1.8 cm, corresponding to the site of patient's palpable lump. Ultrasound-guided biopsy is recommended. 2. Single morphologically abnormal lymph node in the RIGHT axilla, with cortical thickness of 6 mm. This is also a suspicious finding for which ultrasound-guided biopsy is recommended. RECOMMENDATION: 1. Ultrasound-guided biopsy of the RIGHT breast mass at the 9:30   o'clock axis. 2. Ultrasound-guided biopsy of the abnormal lymph node in the RIGHT axilla. Ultrasound-guided biopsies are scheduled for June 19th. I have discussed the findings and recommendations with the patient. Results were also provided in writing at the conclusion of the visit. If applicable, a reminder letter will be sent to the patient regarding the next appointment. BI-RADS CATEGORY  4: Suspicious. Electronically Signed   By: Stan  Maynard M.D.   On: 01/05/2018 15:11   Mm Diag Breast Tomo Uni Right  Result Date: 01/05/2018 CLINICAL DATA:  Patient describes a palpable lump within the RIGHT breast. EXAM: DIGITAL DIAGNOSTIC RIGHT MAMMOGRAM WITH CAD AND TOMO ULTRASOUND RIGHT BREAST COMPARISON:  Previous exams including most recent bilateral screening mammogram dated 05/16/2017. ACR Breast Density Category b: There are scattered areas of fibroglandular density. FINDINGS: There is a  new irregular mass within the upper RIGHT breast, measuring approximately 1.5 cm greatest dimension, corresponding to the area of clinical concern with overlying skin marker in place. There are no new dominant masses, suspicious calcifications or secondary signs of malignancy elsewhere within the RIGHT breast. Mammographic images were processed with CAD. Targeted ultrasound is performed, showing an irregular hypoechoic mass in the RIGHT breast at the 9:30 o'clock axis, 18 cm from the nipple, measuring 1.8 x 1.1 x 1.5 cm, corresponding to the area of clinical concern. A single morphologically abnormal lymph node is identified in the RIGHT axilla, with cortical thickness of 6 mm. IMPRESSION: 1. Highly suspicious mass within the RIGHT breast at the 9:30 o'clock axis, 18 cm from the nipple, measuring 1.8 cm, corresponding to the site of patient's palpable lump. Ultrasound-guided biopsy is recommended. 2. Single morphologically abnormal lymph node in the RIGHT axilla, with cortical thickness of 6 mm. This is also a suspicious finding for which ultrasound-guided biopsy is recommended. RECOMMENDATION: 1. Ultrasound-guided biopsy of the RIGHT breast mass at the 9:30 o'clock axis. 2. Ultrasound-guided biopsy of the abnormal lymph node in the RIGHT axilla. Ultrasound-guided biopsies are scheduled for June 19th. I have discussed the findings and recommendations with the patient. Results were also provided in writing at the conclusion of the visit. If applicable, a reminder letter will be sent to the patient regarding the next appointment. BI-RADS CATEGORY  4: Suspicious. Electronically Signed   By: Stan  Maynard M.D.   On: 01/05/2018 15:11   Us Axillary Node Core Biopsy Right  Addendum Date: 01/12/2018   ADDENDUM REPORT: 01/12/2018 13:25 ADDENDUM: Pathology revealed GRADE III -INVASIVE DUCTAL CARCINOMA of RIGHT breast, 9:30 o'clock, ribbon clip. This was found to be concordant by Dr. Elizabeth Brown. Prior to biopsy, a  second very deep axillary lymph node was also imaged but may be difficult to biopsy given its location. Axillary metastatic disease has not been excluded. Therefore consider MRI for further evaluation of the axilla. Pathology revealed THERE IS NO EVIDENCE OF CARCINOMA IN 1 OF 1 LYMPH NODE, right axillary, spiral HydroMARK clip. This was found to be concordant by Dr. Elizabeth Brown. Pathology results were discussed with the patient by telephone. The patient reported doing well after the biopsy with tenderness at the site. Post biopsy instructions and care were reviewed and questions were answered. The patient was encouraged to call [facility] for any additional concerns. The patient was referred to [facility] on January 18, 2018. Pathology results reported by Angie Akers, RN on 01/12/2018. Electronically Signed   By: Elizabeth  Brown M.D.     On: 01/12/2018 13:25   Result Date: 01/12/2018 CLINICAL DATA:  The patient presents for ultrasound-guided core biopsy of a mass in the 9:30 o'clock location of the RIGHT breast and enlarged RIGHT axillary lymph node. EXAM: ULTRASOUND GUIDED RIGHT BREAST CORE NEEDLE BIOPSY ULTRASOUND-GUIDED RIGHT AXILLARY CORE NEEDLE BIOPSY COMPARISON:  Previous exam(s). FINDINGS: I met with the patient and we discussed the procedure of ultrasound-guided biopsy, including benefits and alternatives. We discussed the high likelihood of a successful procedure. We discussed the risks of the procedure, including infection, bleeding, tissue injury, clip migration, and inadequate sampling. Informed written consent was given. The usual time-out protocol was performed immediately prior to the procedure. Prior to the biopsy ultrasound is performed of the RIGHT breast and axilla. In addition to the previously identified prominent RIGHT axillary lymph node, a second enlarged lymph node is identified very  deep in the axilla, measuring 2.10 x 1.5 x 1.8 centimeters, and lacking fatty hilum. Lesion 1, ribbon shaped clip.  Lesion quadrant: UPPER OUTER QUADRANT Using sterile technique and 1% Lidocaine as local anesthetic, under direct ultrasound visualization, a 12 gauge spring-loaded device was used to perform biopsy of mass in the 9:30 o'clock location of the RIGHT breast using a LATERAL to MEDIAL approach. At the conclusion of the procedure a ribbon shaped tissue marker clip was deployed into the biopsy cavity. Lesion 2, spiral shaped clip.  LEFT axilla: Using sterile technique and 1% lidocaine as local anesthetic, under direct ultrasound visualization a 14 gauge spring-loaded device was used to perform biopsy of enlarged LOWER RIGHT axillary lymph node using a LATERAL to MEDIAL approach. At the conclusion of procedure a spiral shaped tissue marker clip was deployed into the biopsy cavity. Follow up 2 view mammogram was performed and dictated separately. IMPRESSION: 1. Ultrasound guided biopsy of mass in the 9:30 o'clock location of the RIGHT breast. 2. Ultrasound-guided core biopsy of enlarged RIGHT axillary lymph node. 3. A second enlarged RIGHT axillary lymph node is imaged prior to biopsy. 4. No apparent complications. Electronically Signed: By: Elizabeth  Brown M.D. On: 01/11/2018 14:46   Mm Clip Placement Right  Addendum Date: 01/12/2018   ADDENDUM REPORT: 01/12/2018 12:32 ADDENDUM: Corrected report: A spiral shaped clip is identified within the LOWER RIGHT axillary lymph node following biopsy. Electronically Signed   By: Elizabeth  Brown M.D.   On: 01/12/2018 12:32   Result Date: 01/12/2018 CLINICAL DATA:  Status post ultrasound-guided core biopsy of mass in the 9:30 o'clock location of the RIGHT breast and enlarged RIGHT axillary node. EXAM: DIAGNOSTIC RIGHT MAMMOGRAM POST ULTRASOUND BIOPSY x2 COMPARISON:  01/05/2018 and earlier FINDINGS: Mammographic images were obtained following ultrasound guided  biopsy of mass in the 9:30 o'clock location of the RIGHT breast. A ribbon shaped clip is identified within the mass in the is UPPER OUTER QUADRANT of the RIGHT breast. A spiral shaped clip is identified within the LOWER LEFT axillary lymph node following biopsy appear IMPRESSION: Tissue marker clips are in the expected locations after biopsy. Final Assessment: Post Procedure Mammograms for Marker Placement Electronically Signed: By: Elizabeth  Brown M.D. On: 01/11/2018 14:47   Us Rt Breast Bx W Loc Dev 1st Lesion Img Bx Spec Us Guide  Addendum Date: 01/12/2018   ADDENDUM REPORT: 01/12/2018 13:25 ADDENDUM: Pathology revealed GRADE III -INVASIVE DUCTAL CARCINOMA of RIGHT breast, 9:30 o'clock, ribbon clip. This was found to be concordant by Dr. Elizabeth Brown. Prior to biopsy, a second very deep axillary lymph node was also imaged but may be difficult to biopsy   given its location. Axillary metastatic disease has not been excluded. Therefore consider MRI for further evaluation of the axilla. Pathology revealed THERE IS NO EVIDENCE OF CARCINOMA IN 1 OF 1 LYMPH NODE, right axillary, spiral HydroMARK clip. This was found to be concordant by Dr. Elizabeth Brown. Pathology results were discussed with the patient by telephone. The patient reported doing well after the biopsy with tenderness at the site. Post biopsy instructions and care were reviewed and questions were answered. The patient was encouraged to call [facility] for any additional concerns. The patient was referred to [facility] on January 18, 2018. Pathology results reported by Angie Akers, RN on 01/12/2018. Electronically Signed   By: Elizabeth  Brown M.D.   On: 01/12/2018 13:25   Result Date: 01/12/2018 CLINICAL DATA:  The patient presents for ultrasound-guided core biopsy of a mass in the 9:30 o'clock location of the RIGHT breast and enlarged RIGHT  axillary lymph node. EXAM: ULTRASOUND GUIDED RIGHT BREAST CORE NEEDLE BIOPSY ULTRASOUND-GUIDED RIGHT AXILLARY CORE NEEDLE BIOPSY COMPARISON:  Previous exam(s). FINDINGS: I met with the patient and we discussed the procedure of ultrasound-guided biopsy, including benefits and alternatives. We discussed the high likelihood of a successful procedure. We discussed the risks of the procedure, including infection, bleeding, tissue injury, clip migration, and inadequate sampling. Informed written consent was given. The usual time-out protocol was performed immediately prior to the procedure. Prior to the biopsy ultrasound is performed of the RIGHT breast and axilla. In addition to the previously identified prominent RIGHT axillary lymph node, a second enlarged lymph node is identified very deep in the axilla, measuring 2.10 x 1.5 x 1.8 centimeters, and lacking fatty hilum. Lesion 1, ribbon shaped clip.  Lesion quadrant: UPPER OUTER QUADRANT Using sterile technique and 1% Lidocaine as local anesthetic, under direct ultrasound visualization, a 12 gauge spring-loaded device was used to perform biopsy of mass in the 9:30 o'clock location of the RIGHT breast using a LATERAL to MEDIAL approach. At the conclusion of the procedure a ribbon shaped tissue marker clip was deployed into the biopsy cavity. Lesion 2, spiral shaped clip.  LEFT axilla: Using sterile technique and 1% lidocaine as local anesthetic, under direct ultrasound visualization a 14 gauge spring-loaded device was used to perform biopsy of enlarged LOWER RIGHT axillary lymph node using a LATERAL to MEDIAL approach. At the conclusion of procedure a spiral shaped tissue marker clip was deployed into the biopsy cavity. Follow up 2 view mammogram was performed and dictated separately. IMPRESSION: 1. Ultrasound guided biopsy of mass in the 9:30 o'clock location of the RIGHT breast. 2. Ultrasound-guided core biopsy of enlarged RIGHT axillary lymph node. 3. A second  enlarged RIGHT axillary lymph node is imaged prior to biopsy. 4. No apparent complications. Electronically Signed: By: Elizabeth  Brown M.D. On: 01/11/2018 14:46      IMPRESSION/PLAN: Right breast cancer.  Anticipate genetic testing, lumpectomy, adjuvant chemotherapy, and adjuvant RT.  It was a pleasure meeting the patient today. We discussed the risks, benefits, and side effects of radiotherapy. I recommend radiotherapy to the right breast to reduce her risk of locoregional recurrence by 2/3.  We discussed that radiation would take approximately 4 weeks to complete and that I would give the patient a few weeks to recover following chemotherapy before starting treatment planning.   We spoke about acute effects including skin irritation and fatigue as well as much less common late effects including internal   organ injury or irritation. We spoke about the latest technology that is used to minimize the risk of late effects for patients undergoing radiotherapy to the breast or chest wall. No guarantees of treatment were given. The patient is enthusiastic about proceeding with treatment. I look forward to participating in the patient's care.  I will await her referral back to me for postoperative follow-up and eventual CT simulation/treatment planning.   __________________________________________   Sarah Squire, MD  This document serves as a record of services personally performed by Sarah Squire, MD. It was created on his behalf by Jonathan White, a trained medical scribe. The creation of this record is based on the scribe's personal observations and the provider's statements to them. This document has been checked and approved by the attending provider.    

## 2018-01-19 ENCOUNTER — Encounter: Payer: Self-pay | Admitting: General Practice

## 2018-01-19 ENCOUNTER — Encounter: Payer: Self-pay | Admitting: Genetic Counselor

## 2018-01-19 ENCOUNTER — Inpatient Hospital Stay (HOSPITAL_BASED_OUTPATIENT_CLINIC_OR_DEPARTMENT_OTHER): Payer: BC Managed Care – PPO | Admitting: Genetic Counselor

## 2018-01-19 ENCOUNTER — Telehealth: Payer: Self-pay | Admitting: Oncology

## 2018-01-19 ENCOUNTER — Telehealth (HOSPITAL_COMMUNITY): Payer: Self-pay | Admitting: Vascular Surgery

## 2018-01-19 DIAGNOSIS — Z800 Family history of malignant neoplasm of digestive organs: Secondary | ICD-10-CM | POA: Insufficient documentation

## 2018-01-19 DIAGNOSIS — Z801 Family history of malignant neoplasm of trachea, bronchus and lung: Secondary | ICD-10-CM | POA: Insufficient documentation

## 2018-01-19 DIAGNOSIS — Z170 Estrogen receptor positive status [ER+]: Secondary | ICD-10-CM

## 2018-01-19 DIAGNOSIS — Z803 Family history of malignant neoplasm of breast: Secondary | ICD-10-CM | POA: Insufficient documentation

## 2018-01-19 DIAGNOSIS — C50411 Malignant neoplasm of upper-outer quadrant of right female breast: Secondary | ICD-10-CM

## 2018-01-19 NOTE — Telephone Encounter (Signed)
Left  pt message to make NEW Brst app w/ echo dates ava: July 16, 17 ,18 @ 2 for echo and 3 pm w/ dm

## 2018-01-19 NOTE — Telephone Encounter (Signed)
Per 6/26 no los

## 2018-01-19 NOTE — Progress Notes (Signed)
[place] Psychosocial Distress Screening Spiritual Care  Met with Kelly Rowe, two sisters, and daughter in [facility] to introduce [place] team/resources, reviewing distress screen per protocol.  The patient scored a 7 on the Psychosocial Distress Thermometer which indicates severe distress. Also assessed for distress and other psychosocial needs.   ONCBCN DISTRESS SCREENING 01/19/2018  Screening Type Initial Screening  Distress experienced in past week (1-10) 7  Practical problem type Insurance;Work/school;Transportation  Emotional problem type Depression;Nervousness/Anxiety;Adjusting to illness  Physical Problem type Pain;Getting around  Referral to support programs Yes   Kelly Rowe reports support from family and was intermittently tearful during this encounter due to stress about dx and how tx may affect her ability to work. 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 Additionally, she is having car trouble that compromises her transportation and availability to get to appts.   Follow up needed: Yes.   Though she describes herself as a private person who doesn't talk much, Kelly Rowe f/u call as part of her continued care. Plan to phone next week, but please also page if urgent needs arise or circumstances change. Thank you. Will also refer to LCSW to assess practical needs.   [place], [place], [facility] Pager [phone] Voicemail [phone]

## 2018-01-19 NOTE — Progress Notes (Signed)
REFERRING PROVIDER: Chauncey Cruel, MD [address] [place], [place] [address]  PRIMARY PROVIDER:  Biagio Borg, MD  PRIMARY REASON FOR VISIT:  1. Malignant neoplasm of upper-outer quadrant of right breast in female, estrogen receptor positive ([place])   2. Family history of breast cancer   3. Family history of colon cancer   4. Family history of lung cancer      HISTORY OF PRESENT ILLNESS:   Kelly Rowe, a 47 y.o. female, was seen for a [place] cancer genetics consultation at the request of Dr. Jana Hakim due to a personal and family history of cancer.  Kelly Rowe presents to clinic today to discuss the possibility of a hereditary predisposition to cancer, genetic testing, and to further clarify her future cancer risks, as well as potential cancer risks for family members.   In June 2019, at the age of 78, Kelly Rowe was diagnosed with invasive ductal carcinoma of the right breast. This will be treated with chemotherapy and radiation. IT is unclear what kind of surgery she will undergo.Marland Kitchen     CANCER HISTORY:   No history exists.     HORMONAL RISK FACTORS:  Menarche was at age 35.  First live birth at age 50.  OCP use for approximately 10+ years.  Ovaries intact: yes.  Hysterectomy: no.  Menopausal status: premenopausal.  HRT use: 0 years. Colonoscopy: no; not examined. Mammogram within the last year: yes. Number of breast biopsies: 1. Up to date with pelvic exams:  yes. Any excessive radiation exposure in the past:  no  Past Medical History:  Diagnosis Date  . ANEMIA-IRON DEFICIENCY 01/30/2010  . ANXIETY 11/27/2007  . Arthritis    both knees  . Asthma 02/25/2011  . Carbuncle and furuncle of trunk 04/16/2010  . Chlamydia infection 03/21/2008  . DIABETES MELLITUS, TYPE II 08/02/2007  . Edema 01/30/2010  . ELEVATED BLOOD PRESSURE WITHOUT DIAGNOSIS OF HYPERTENSION 08/02/2007  . Family history of breast cancer   . Family history of colon cancer   . Family history of  lung cancer   . FREQUENCY, URINARY 05/19/2009  . GENITAL HERPES 03/12/2009  . HIV (human immunodeficiency virus infection) ([place]) 2009  . HIV INFECTION 03/12/2009  . HSV (herpes simplex virus) infection   . HYPERLIPIDEMIA 08/02/2007  . Metrorrhagia 03/21/2008  . RETENTION, URINE 05/19/2009  . Trichomonas infection 01/19/2010  . VITAMIN D DEFICIENCY 01/30/2010    Past Surgical History:  Procedure Laterality Date  . ABDOMINAL HYSTERECTOMY  07/22/2010   TAH WITH PRESERVATION OF BOTH TUBES AND OVARIES  . CHOLECYSTECTOMY    . ENDOMETRIAL ABLATION  01/11/2008   HER OPTION  . TUBAL LIGATION      Social History   Socioeconomic History  . Marital status: Divorced    Spouse name: Not on file  . Number of children: Not on file  . Years of education: Not on file  . Highest education level: Not on file  Occupational History  . Not on file  Social Needs  . Financial resource strain: Not on file  . Food insecurity:    Worry: Not on file    Inability: Not on file  . Transportation needs:    Medical: Not on file    Non-medical: Not on file  Tobacco Use  . Smoking status: Never Smoker  . Smokeless tobacco: Never Used  Substance and Sexual Activity  . Alcohol use: Yes    Alcohol/week: 0.0 oz    Comment: OCCASIONALLY  . Drug use:  No  . Sexual activity: Yes    Birth control/protection: Surgical    Comment: HAS HAD A HYSTERECTOMY  Lifestyle  . Physical activity:    Days per week: Not on file    Minutes per session: Not on file  . Stress: Not on file  Relationships  . Social connections:    Talks on phone: Not on file    Gets together: Not on file    Attends religious service: Not on file    Active member of club or organization: Not on file    Attends meetings of clubs or organizations: Not on file    Relationship status: Not on file  Other Topics Concern  . Not on file  Social History Narrative  . Not on file     FAMILY HISTORY:  We obtained a detailed, 4-generation family  history.  Significant diagnoses are listed below: Family History  Problem Relation Age of Onset  . Prostate cancer Other   . Heart disease Other   . Stroke Other   . Diabetes Mother   . Hypertension Mother   . Diabetes Father   . Cancer Father        COLON and LU NG  . Stroke Paternal Uncle   . Breast cancer Paternal Aunt   . Lung cancer Maternal Grandmother 69       Mesothelioma  . Colon cancer Paternal Grandfather        dx over 66s  . Lung cancer Paternal Aunt   . Breast cancer Paternal Aunt   . Breast cancer Paternal Aunt   . Heart attack Maternal Grandfather 71  . Colon cancer Other        MGM's 5 brothers    The patient has five children, three boys and two girls, none who have cancer.  She has three full sisters and a paternal half brother, none who have cancer.  Her mother is living and her father is deceased by cancer.  The patient's mother had two sisters who are cancer free.  Both maternal grandparents are deceased.  The grandmother had lung cancer due to mesothelioma.  She has multiple siblings, five brothers had colon cancer.  The patient's father had metastatic liver cancer and lung cancer.  He was one of 13 children.  He had three sisters who had breast cancer, and his father had colon cancer.  There is no other cancer reported in the family.  Kelly Rowe is unaware of previous family history of genetic testing for hereditary cancer risks. Patient's maternal ancestors are of African American descent, and paternal ancestors are of African American descent. There is no reported Ashkenazi Jewish ancestry. There is no known consanguinity.  GENETIC COUNSELING ASSESSMENT: Kelly Rowe is a 47 y.o. female with a personal and family history of breast cancer and other cancers which is somewhat suggestive of a hereditary cancer syndrome and predisposition to cancer. We, therefore, discussed and recommended the following at today's visit.   DISCUSSION: We discussed that  about 5-10% of breast cancer is hereditary, with most cases due to BRCA mutations.  Other genes associated with hereditary breast cancer syndromes include ATM, CHEK2 and PALB2.  Kelly Rowe has several family members with other cancers, including a young lung cancer due to mesothelioma.  We discussed that most mesothelioma lung cancers are due to asbestos exposure.  Individuals who have BAP1 mutations are at increased risk for mesothelioma cancers, regardless of asbestos exposure, especially at young ages.  We feel that based  on her family history of multiple different cancers, we feel that a larger cancer panel would be appropriate.  We reviewed the characteristics, features and inheritance patterns of hereditary cancer syndromes. We also discussed genetic testing, including the appropriate family members to test, the process of testing, insurance coverage and turn-around-time for results. We discussed the implications of a negative, positive and/or variant of uncertain significant result. In order to get genetic test results in a timely manner so that Kelly Rowe can use these genetic test results for surgical decisions, we recommended Kelly Rowe pursue genetic testing for the 9 gene STAT panel. If this test is negative, we then recommend Kelly Rowe pursue reflex genetic testing to the multi-cancer gene panel. The Multi-Gene Panel offered by [facility] includes sequencing and/or deletion duplication testing of the following 83 genes: ALK, APC, ATM, AXIN2,BAP1,  BARD1, BLM, BMPR1A, BRCA1, BRCA2, BRIP1, CASR, CDC73, CDH1, CDK4, CDKN1B, CDKN1C, CDKN2A (p14ARF), CDKN2A (p16INK4a), CEBPA, CHEK2, CTNNA1, DICER1, DIS3L2, EGFR (c.2369C>T, p.Thr790Met variant only), EPCAM (Deletion/duplication testing only), FH, FLCN, GATA2, GPC3, GREM1 (Promoter region deletion/duplication testing only), HOXB13 (c.251G>A, p.Gly84Glu), HRAS, KIT, MAX, MEN1, MET, MITF (c.952G>A, p.Glu318Lys variant only), MLH1, MSH2, MSH3, MSH6, MUTYH,  NBN, NF1, NF2, NTHL1, PALB2, PDGFRA, PHOX2B, PMS2, POLD1, POLE, POT1, PRKAR1A, PTCH1, PTEN, RAD50, RAD51C, RAD51D, RB1, RECQL4, RET, RUNX1, SDHAF2, SDHA (sequence changes only), SDHB, SDHC, SDHD, SMAD4, SMARCA4, SMARCB1, SMARCE1, STK11, SUFU, TERT, TERT, TMEM127, TP53, TSC1, TSC2, VHL, WRN and WT1.    Based on Kelly Rowe's personal and family history of cancer, she meets medical criteria for genetic testing. Despite that she meets criteria, she may still have an out of pocket cost. We discussed that if her out of pocket cost for testing is over $100, the laboratory will call and confirm whether she wants to proceed with testing.  If the out of pocket cost of testing is less than $100 she will be billed by the genetic testing laboratory.   PLAN: After considering the risks, benefits, and limitations, Kelly Rowe  provided informed consent to pursue genetic testing and the blood sample was sent to [facility] for analysis of the STAT and Multi cancer panel. Results should be available within approximately 7-10 days time, at which time the remainder of the panel will be conducted and should take an additional 2-3 weeks' time, at which point they will be disclosed by telephone to Kelly Rowe, as will any additional recommendations warranted by these results. Kelly Rowe will receive a summary of her genetic counseling visit and a copy of her results once available. This information will also be available in Epic. We encouraged Kelly Rowe to remain in contact with cancer genetics annually so that we can continuously update the family history and inform her of any changes in cancer genetics and testing that may be of benefit for her family. Kelly Rowe questions were answered to her satisfaction today. Our contact information was provided should additional questions or concerns arise.  Lastly, we encouraged Kelly Rowe to remain in contact with cancer genetics annually so that we can continuously update  the family history and inform her of any changes in cancer genetics and testing that may be of benefit for this family.   Ms.  Rowe questions were answered to her satisfaction today. Our contact information was provided should additional questions or concerns arise. Thank you for the referral and allowing [place] to share in the care of your patient.   Karen P. Florene Glen, [place], [facility] Certified Genetic Counselor [email or web address] phone:  [phone]  The patient was seen for a total of 35 minutes in face-to-face genetic counseling.  This patient was discussed with Drs. [name], Lindi Adie and/or Burr Medico who agrees with the above.    _______________________________________________________________________ For Office Staff:  Number of people involved in session: 2 Was an Intern/ student involved with case: no

## 2018-01-25 ENCOUNTER — Ambulatory Visit
Admission: RE | Admit: 2018-01-25 | Discharge: 2018-01-25 | Disposition: A | Discharge status: Home / Self Care | Payer: BC Managed Care – PPO | Source: Ambulatory Visit | Attending: General Surgery | Admitting: General Surgery

## 2018-01-25 ENCOUNTER — Encounter: Payer: Self-pay | Admitting: General Practice

## 2018-01-25 DIAGNOSIS — C50411 Malignant neoplasm of upper-outer quadrant of right female breast: Secondary | ICD-10-CM

## 2018-01-25 DIAGNOSIS — Z170 Estrogen receptor positive status [ER+]: Secondary | ICD-10-CM

## 2018-01-25 MED ORDER — GADOBENATE DIMEGLUMINE 529 MG/ML IV SOLN
20.0000 mL | Freq: Once | INTRAVENOUS | Status: AC | PRN
  Administered 2018-01-25: 20 mL via INTRAVENOUS

## 2018-01-25 NOTE — Progress Notes (Signed)
[name]  Kelly Rowe by phone as planned for [facility] f/u. She reports having 75% good days and more support than she even anticipated, which has been a boost. At this point, the unknown and the myriad appts are her biggest stressors. She continues to welcome Spiritual Care check-in calls for emotional support and encouragement. We plan to f/u by phone the week of 7/22, after her echo and while she is waiting to see Dr Jana Hakim (7/26). She knows to call me anytime as well.   [place], [place], [name] Pager [phone] Voicemail [phone]

## 2018-01-27 ENCOUNTER — Telehealth: Payer: Self-pay | Admitting: *Deleted

## 2018-01-27 NOTE — Telephone Encounter (Signed)
  Oncology Nurse Navigator Documentation  Navigator Location: [facility] (01/27/18 1500)   )Navigator Encounter Type: Telephone;[facility] Follow-up (01/27/18 1500) Telephone: Outgoing Call;Clinic/[facility] Follow-up (01/27/18 1500)                                                  Time Spent with Patient: 15 (01/27/18 1500)

## 2018-01-30 ENCOUNTER — Other Ambulatory Visit: Payer: Self-pay | Admitting: Oncology

## 2018-01-30 ENCOUNTER — Encounter: Payer: Self-pay | Admitting: Genetic Counselor

## 2018-01-30 ENCOUNTER — Ambulatory Visit: Payer: BC Managed Care – PPO | Admitting: Women's Health

## 2018-01-30 ENCOUNTER — Encounter: Payer: Self-pay | Admitting: Women's Health

## 2018-01-30 ENCOUNTER — Telehealth: Payer: Self-pay | Admitting: Genetic Counselor

## 2018-01-30 VITALS — BP 124/80

## 2018-01-30 DIAGNOSIS — N3000 Acute cystitis without hematuria: Secondary | ICD-10-CM

## 2018-01-30 DIAGNOSIS — Z170 Estrogen receptor positive status [ER+]: Secondary | ICD-10-CM

## 2018-01-30 DIAGNOSIS — C50411 Malignant neoplasm of upper-outer quadrant of right female breast: Secondary | ICD-10-CM

## 2018-01-30 DIAGNOSIS — Z1379 Encounter for other screening for genetic and chromosomal anomalies: Secondary | ICD-10-CM | POA: Insufficient documentation

## 2018-01-30 DIAGNOSIS — C773 Secondary and unspecified malignant neoplasm of axilla and upper limb lymph nodes: Secondary | ICD-10-CM

## 2018-01-30 DIAGNOSIS — R300 Dysuria: Secondary | ICD-10-CM | POA: Diagnosis not present

## 2018-01-30 DIAGNOSIS — C50919 Malignant neoplasm of unspecified site of unspecified female breast: Secondary | ICD-10-CM

## 2018-01-30 DIAGNOSIS — C771 Secondary and unspecified malignant neoplasm of intrathoracic lymph nodes: Secondary | ICD-10-CM

## 2018-01-30 MED ORDER — FLUCONAZOLE 100 MG PO TABS: 100.0000 mg | ORAL_TABLET | Freq: Every day | ORAL | 0 refills | Status: DC

## 2018-01-30 MED ORDER — SULFAMETHOXAZOLE-TRIMETHOPRIM 800-160 MG PO TABS: 1.0000 | ORAL_TABLET | Freq: Two times a day (BID) | ORAL | 0 refills | Status: DC

## 2018-01-30 NOTE — Patient Instructions (Signed)

## 2018-01-30 NOTE — Progress Notes (Signed)
I called the patient and left her a voicemail stating that because of the adenopathy seen in her breast MRI showed no studies would be helpful and she has been set up for CT of the chest on a bone scan.  I also gave her the number to call if she had questions regarding this.

## 2018-01-30 NOTE — Progress Notes (Signed)
47 year old D BF G7, P5 presents with complaint of increased urinary frequency, bladder pressure, pain and burning for the past 3 days.  Denies backache, nausea, vaginal discharge or fever.  Pain and burning with urination.  2011 TAH for fibroids and menorrhagia.  Hypertension, diabetes managed by primary care.  2011+ HIV followed at [facility], low viral count.  Last week diagnosed with breast cancer triple positive, Dr. Jana Hakim managing.  Waiting for lumpectomy to be scheduled.  Found out today BRCA negative.    Exam: Appears well, obese, no CVAT. UA: +1 blood, positive nitrites, +2 leukocytes, greater than 60 WBCs, 3-10 RBCs, 10-20 squamous epithelials, many bacteria, trace ketones  UTI  Plan: Septra twice daily for 3 days prescription, proper use given and reviewed.  Reviewed importance of increasing fluids, encouraged to avoid soda and increase plain water.  Instructed to call if symptoms persist.  UTI prevention discussed.  Urine culture pending.

## 2018-01-30 NOTE — Telephone Encounter (Signed)
Revealed negative genetic testing.  Discussed that we do not know why she has breast cancer or why there is cancer in the family. It could be due to a different gene that we are not testing, or maybe our current technology may not be able to pick something up.  It will be important for her to keep in contact with genetics to keep up with whether additional testing may be needed.  Explained that her sisters and her daughter may still be at increased risk due to family history and that they should start being screened 10 years earlier than her age of onset, and possibly may need breast MRI based on the family history.

## 2018-02-01 ENCOUNTER — Ambulatory Visit: Payer: Self-pay | Admitting: Genetic Counselor

## 2018-02-01 ENCOUNTER — Encounter: Payer: Self-pay | Admitting: General Practice

## 2018-02-01 DIAGNOSIS — Z1379 Encounter for other screening for genetic and chromosomal anomalies: Secondary | ICD-10-CM

## 2018-02-01 DIAGNOSIS — C50411 Malignant neoplasm of upper-outer quadrant of right female breast: Secondary | ICD-10-CM

## 2018-02-01 DIAGNOSIS — Z170 Estrogen receptor positive status [ER+]: Secondary | ICD-10-CM

## 2018-02-01 NOTE — Progress Notes (Signed)
HPI:  Ms. Mettler was previously seen in the [facility] due to a personal and family history of cancer and concerns regarding a hereditary predisposition to cancer. Please refer to our prior cancer genetics clinic note for more information regarding Ms. Ala's medical, social and family histories, and our assessment and recommendations, at the time. Ms. Mauch recent genetic test results were disclosed to her, as were recommendations warranted by these results. These results and recommendations are discussed in more detail below.  CANCER HISTORY:    Malignant neoplasm of upper-outer quadrant of right breast in female, estrogen receptor positive ([place])   01/17/2018 Initial Diagnosis    Malignant neoplasm of upper-outer quadrant of right breast in female, estrogen receptor positive ([place])      01/26/2018 Genetic Testing    Negative genetic testing on the multicancer panel.  The Multi-Gene Panel offered by [facility] includes sequencing and/or deletion duplication testing of the following 83 genes: ALK, APC, ATM, AXIN2,BAP1,  BARD1, BLM, BMPR1A, BRCA1, BRCA2, BRIP1, CASR, CDC73, CDH1, CDK4, CDKN1B, CDKN1C, CDKN2A (p14ARF), CDKN2A (p16INK4a), CEBPA, CHEK2, CTNNA1, DICER1, DIS3L2, EGFR (c.2369C>T, p.Thr790Met variant only), EPCAM (Deletion/duplication testing only), FH, FLCN, GATA2, GPC3, GREM1 (Promoter region deletion/duplication testing only), HOXB13 (c.251G>A, p.Gly84Glu), HRAS, KIT, MAX, MEN1, MET, MITF (c.952G>A, p.Glu318Lys variant only), MLH1, MSH2, MSH3, MSH6, MUTYH, NBN, NF1, NF2, NTHL1, PALB2, PDGFRA, PHOX2B, PMS2, POLD1, POLE, POT1, PRKAR1A, PTCH1, PTEN, RAD50, RAD51C, RAD51D, RB1, RECQL4, RET, RUNX1, SDHAF2, SDHA (sequence changes only), SDHB, SDHC, SDHD, SMAD4, SMARCA4, SMARCB1, SMARCE1, STK11, SUFU, TERT, TERT, TMEM127, TP53, TSC1, TSC2, VHL, WRN and WT1.   The report date is January 26, 2018.       FAMILY HISTORY:  We obtained a detailed, 4-generation family history.   Significant diagnoses are listed below: Family History  Problem Relation Age of Onset  . Prostate cancer Other   . Heart disease Other   . Stroke Other   . Diabetes Mother   . Hypertension Mother   . Diabetes Father   . Cancer Father        COLON and LU NG  . Stroke Paternal Uncle   . Breast cancer Paternal Aunt   . Lung cancer Maternal Grandmother 14       Mesothelioma  . Colon cancer Paternal Grandfather        dx over 66s  . Lung cancer Paternal Aunt   . Breast cancer Paternal Aunt   . Breast cancer Paternal Aunt   . Heart attack Maternal Grandfather 71  . Colon cancer Other        MGM's 5 brothers    The patient has five children, three boys and two girls, none who have cancer.  She has three full sisters and a paternal half brother, none who have cancer.  Her mother is living and her father is deceased by cancer.  The patient's mother had two sisters who are cancer free.  Both maternal grandparents are deceased.  The grandmother had lung cancer due to mesothelioma.  She has multiple siblings, five brothers had colon cancer.  The patient's father had metastatic liver cancer and lung cancer.  He was one of 13 children.  He had three sisters who had breast cancer, and his father had colon cancer.  There is no other cancer reported in the family.  Ms. Thebeau is unaware of previous family history of genetic testing for hereditary cancer risks. Patient's maternal ancestors are of African American descent, and paternal ancestors are of African American descent.  There is no reported Ashkenazi Jewish ancestry. There is no known consanguinity.  GENETIC TEST RESULTS: Genetic testing reported out on January 26, 2018 through the Multi-cancer panel found no deleterious mutations.  The Multi-Gene Panel offered by [facility] includes sequencing and/or deletion duplication testing of the following 83 genes: ALK, APC, ATM, AXIN2,BAP1,  BARD1, BLM, BMPR1A, BRCA1, BRCA2, BRIP1, CASR, CDC73, CDH1,  CDK4, CDKN1B, CDKN1C, CDKN2A (p14ARF), CDKN2A (p16INK4a), CEBPA, CHEK2, CTNNA1, DICER1, DIS3L2, EGFR (c.2369C>T, p.Thr790Met variant only), EPCAM (Deletion/duplication testing only), FH, FLCN, GATA2, GPC3, GREM1 (Promoter region deletion/duplication testing only), HOXB13 (c.251G>A, p.Gly84Glu), HRAS, KIT, MAX, MEN1, MET, MITF (c.952G>A, p.Glu318Lys variant only), MLH1, MSH2, MSH3, MSH6, MUTYH, NBN, NF1, NF2, NTHL1, PALB2, PDGFRA, PHOX2B, PMS2, POLD1, POLE, POT1, PRKAR1A, PTCH1, PTEN, RAD50, RAD51C, RAD51D, RB1, RECQL4, RET, RUNX1, SDHAF2, SDHA (sequence changes only), SDHB, SDHC, SDHD, SMAD4, SMARCA4, SMARCB1, SMARCE1, STK11, SUFU, TERT, TERT, TMEM127, TP53, TSC1, TSC2, VHL, WRN and WT1.  The test report has been scanned into EPIC and is located under the Molecular Pathology section of the Results Review tab.    We discussed with Ms. Lees that since the current genetic testing is not perfect, it is possible there may be a gene mutation in one of these genes that current testing cannot detect, but that chance is small.  We also discussed, that it is possible that another gene that has not yet been discovered, or that we have not yet tested, is responsible for the cancer diagnoses in the family, and it is, therefore, important to remain in touch with cancer genetics in the future so that we can continue to offer Ms. Ramone the most up to date genetic testing.   CANCER SCREENING RECOMMENDATIONS: This result is reassuring and indicates that Ms. Thumm likely does not have an increased risk for a future cancer due to a mutation in one of these genes. This normal test also suggests that Ms. Ohmer's cancer was most likely not due to an inherited predisposition associated with one of these genes.  Most cancers happen by chance and this negative test suggests that her cancer falls into this category.  We, therefore, recommended she continue to follow the cancer management and screening guidelines provided by her  oncology and primary healthcare provider.   An individual's cancer risk and medical management are not determined by genetic test results alone. Overall cancer risk assessment incorporates additional factors, including personal medical history, family history, and any available genetic information that may result in a personalized plan for cancer prevention and surveillance.    RECOMMENDATIONS FOR FAMILY MEMBERS:  Women in this family might be at some increased risk of developing cancer, over the general population risk, simply due to the family history of cancer.  We recommended women in this family have a yearly mammogram beginning at age 68, or 55 years younger than the earliest onset of cancer, an annual clinical breast exam, and perform monthly breast self-exams. Women in this family should also have a gynecological exam as recommended by their primary provider. All family members should have a colonoscopy by age 75.  FOLLOW-UP: Lastly, we discussed with Ms. Jeremiah that cancer genetics is a rapidly advancing field and it is possible that new genetic tests will be appropriate for her and/or her family members in the future. We encouraged her to remain in contact with cancer genetics on an annual basis so we can update her personal and family histories and let her know of advances in cancer genetics that may benefit this family.  Our contact number was provided. Ms. Garske questions were answered to her satisfaction, and she knows she is welcome to call us at anytime with additional questions or concerns.   Roma Kayser, MS, [facility] Certified Genetic Counselor [email or web address]

## 2018-02-01 NOTE — Progress Notes (Signed)
[place] CSW Progress Notes  Referral received from chaplain to assess for practical resource needs.  Spoke w patient by phone - she is now working a reduced schedule of 4 hours/day.  Finances are tight.  Discussed Chemical engineer, referral sent to [employer].  Also referred to Cancer Care - patient is calling to complete their intake process.  Declined referrals to local financial assistance charities, wants to wait until needs increase.  Has emotional support from family but no outside financial help.  Encouarged patient to call as needed, information packet sent.  Declined referral for [facility] guide at this time, but CSW provided information on this program as well.  Edwyna Shell, LCSW Clinical Social Worker Phone:  [phone]

## 2018-02-02 ENCOUNTER — Other Ambulatory Visit: Payer: Self-pay | Admitting: General Surgery

## 2018-02-02 DIAGNOSIS — C50411 Malignant neoplasm of upper-outer quadrant of right female breast: Secondary | ICD-10-CM

## 2018-02-02 DIAGNOSIS — Z170 Estrogen receptor positive status [ER+]: Secondary | ICD-10-CM

## 2018-02-02 LAB — URINE CULTURE
MICRO NUMBER:: 90810640
SPECIMEN QUALITY:: ADEQUATE
Sample Source: 0

## 2018-02-02 LAB — URINALYSIS, COMPLETE W/RFL CULTURE
Bilirubin Urine: NEGATIVE
Glucose, UA: NEGATIVE
Hyaline Cast: NONE SEEN /LPF
Nitrites, Initial: POSITIVE — AB
Specific Gravity, Urine: 1.025 (ref 1.001–1.03)
WBC, UA: >=60 /HPF — AB (ref 0–5)
pH: 5.5 (ref 5.0–8.0)

## 2018-02-02 LAB — CULTURE INDICATED

## 2018-02-06 ENCOUNTER — Encounter (HOSPITAL_COMMUNITY)
Admission: RE | Admit: 2018-02-06 | Discharge: 2018-02-06 | Disposition: A | Discharge status: Home / Self Care | Payer: BC Managed Care – PPO | Source: Ambulatory Visit | Attending: Oncology | Admitting: Oncology

## 2018-02-06 ENCOUNTER — Other Ambulatory Visit: Payer: Self-pay | Admitting: General Surgery

## 2018-02-06 ENCOUNTER — Ambulatory Visit (HOSPITAL_COMMUNITY)
Admission: RE | Admit: 2018-02-06 | Discharge: 2018-02-06 | Disposition: A | Discharge status: Home / Self Care | Payer: BC Managed Care – PPO | Source: Ambulatory Visit | Attending: Oncology | Admitting: Oncology

## 2018-02-06 DIAGNOSIS — Z170 Estrogen receptor positive status [ER+]: Secondary | ICD-10-CM

## 2018-02-06 DIAGNOSIS — C773 Secondary and unspecified malignant neoplasm of axilla and upper limb lymph nodes: Secondary | ICD-10-CM

## 2018-02-06 DIAGNOSIS — C50919 Malignant neoplasm of unspecified site of unspecified female breast: Secondary | ICD-10-CM

## 2018-02-06 DIAGNOSIS — C778 Secondary and unspecified malignant neoplasm of lymph nodes of multiple regions: Secondary | ICD-10-CM | POA: Insufficient documentation

## 2018-02-06 DIAGNOSIS — R590 Localized enlarged lymph nodes: Secondary | ICD-10-CM | POA: Insufficient documentation

## 2018-02-06 DIAGNOSIS — C771 Secondary and unspecified malignant neoplasm of intrathoracic lymph nodes: Secondary | ICD-10-CM

## 2018-02-06 DIAGNOSIS — C50411 Malignant neoplasm of upper-outer quadrant of right female breast: Secondary | ICD-10-CM

## 2018-02-06 MED ORDER — IOHEXOL 300 MG/ML  SOLN
75.0000 mL | Freq: Once | INTRAMUSCULAR | Status: AC | PRN
  Administered 2018-02-06: 75 mL via INTRAVENOUS

## 2018-02-06 MED ORDER — TECHNETIUM TC 99M MEDRONATE IV KIT
20.0000 | PACK | Freq: Once | INTRAVENOUS | Status: AC | PRN
  Administered 2018-02-06: 20 via INTRAVENOUS

## 2018-02-07 ENCOUNTER — Ambulatory Visit (HOSPITAL_COMMUNITY)
Admission: RE | Admit: 2018-02-07 | Discharge: 2018-02-07 | Disposition: A | Discharge status: Home / Self Care | Payer: BC Managed Care – PPO | Source: Ambulatory Visit | Attending: Cardiology | Admitting: Cardiology

## 2018-02-07 ENCOUNTER — Ambulatory Visit (HOSPITAL_BASED_OUTPATIENT_CLINIC_OR_DEPARTMENT_OTHER)
Admission: RE | Admit: 2018-02-07 | Discharge: 2018-02-07 | Disposition: A | Discharge status: Home / Self Care | Payer: BC Managed Care – PPO | Source: Ambulatory Visit | Attending: Cardiology | Admitting: Cardiology

## 2018-02-07 VITALS — BP 138/82 | HR 82 | Wt 261.0 lb

## 2018-02-07 DIAGNOSIS — J45909 Unspecified asthma, uncomplicated: Secondary | ICD-10-CM | POA: Insufficient documentation

## 2018-02-07 DIAGNOSIS — Z823 Family history of stroke: Secondary | ICD-10-CM | POA: Insufficient documentation

## 2018-02-07 DIAGNOSIS — Z8042 Family history of malignant neoplasm of prostate: Secondary | ICD-10-CM | POA: Diagnosis not present

## 2018-02-07 DIAGNOSIS — I517 Cardiomegaly: Secondary | ICD-10-CM | POA: Diagnosis not present

## 2018-02-07 DIAGNOSIS — Z803 Family history of malignant neoplasm of breast: Secondary | ICD-10-CM | POA: Diagnosis not present

## 2018-02-07 DIAGNOSIS — C50411 Malignant neoplasm of upper-outer quadrant of right female breast: Secondary | ICD-10-CM | POA: Diagnosis not present

## 2018-02-07 DIAGNOSIS — Z800 Family history of malignant neoplasm of digestive organs: Secondary | ICD-10-CM | POA: Diagnosis not present

## 2018-02-07 DIAGNOSIS — Z170 Estrogen receptor positive status [ER+]: Secondary | ICD-10-CM | POA: Insufficient documentation

## 2018-02-07 DIAGNOSIS — Z833 Family history of diabetes mellitus: Secondary | ICD-10-CM | POA: Insufficient documentation

## 2018-02-07 DIAGNOSIS — Z801 Family history of malignant neoplasm of trachea, bronchus and lung: Secondary | ICD-10-CM | POA: Diagnosis not present

## 2018-02-07 DIAGNOSIS — E119 Type 2 diabetes mellitus without complications: Secondary | ICD-10-CM | POA: Insufficient documentation

## 2018-02-07 DIAGNOSIS — B20 Human immunodeficiency virus [HIV] disease: Secondary | ICD-10-CM | POA: Insufficient documentation

## 2018-02-07 NOTE — Patient Instructions (Signed)
Follow up and echo in 3 months with Dr.McLean.  **please call our office in October at [phone] to schedule November appointment**

## 2018-02-08 NOTE — Progress Notes (Signed)
Oncologist: Dr. Jana Hakim  47 yo with history of type II diabetes, HIV, and newly diagnosed breast cancer was referred by Dr. Jana Hakim for cardio-oncology evaluation.  Breast cancer on right was diagnosed by biopsy in 6/19.  ER+/PR+/HER2+.  Plan for definitive surgery followed by chemotherapy + Herceptin x 1 year.   She has no prior cardiac history.  No exertional chest pain or dyspnea. No palpitations. No orthopnea/PND.    PMH: 1. Type 2 diabetes 2. Asthma 3. HIV 4. Breast cancer: On right, diagnosed by biopsy in 6/19.  ER+/PR+/HER2+.  Plan for definitive surgery followed by chemotherapy + Herceptin x 1 year.  - Echo (7/19): EF 55-60%, mild LVH, GLS -17.9%, normal RV size and systolic function.   Social History   Socioeconomic History  . Marital status: Divorced    Spouse name: Not on file  . Number of children: Not on file  . Years of education: Not on file  . Highest education level: Not on file  Occupational History  . Not on file  Social Needs  . Financial resource strain: Not on file  . Food insecurity:    Worry: Not on file    Inability: Not on file  . Transportation needs:    Medical: Not on file    Non-medical: Not on file  Tobacco Use  . Smoking status: Never Smoker  . Smokeless tobacco: Never Used  Substance and Sexual Activity  . Alcohol use: Yes    Alcohol/week: 0.0 oz    Comment: OCCASIONALLY  . Drug use: No  . Sexual activity: Yes    Birth control/protection: Surgical    Comment: HAS HAD A HYSTERECTOMY  Lifestyle  . Physical activity:    Days per week: Not on file    Minutes per session: Not on file  . Stress: Not on file  Relationships  . Social connections:    Talks on phone: Not on file    Gets together: Not on file    Attends religious service: Not on file    Active member of club or organization: Not on file    Attends meetings of clubs or organizations: Not on file    Relationship status: Not on file  Other Topics Concern  . Not on file   Social History Narrative  . Not on file   Family History  Problem Relation Age of Onset  . Prostate cancer Other   . Heart disease Other   . Stroke Other   . Diabetes Mother   . Hypertension Mother   . Diabetes Father   . Cancer Father        COLON and LU NG  . Stroke Paternal Uncle   . Breast cancer Paternal Aunt   . Lung cancer Maternal Grandmother 62       Mesothelioma  . Colon cancer Paternal Grandfather        dx over 66s  . Lung cancer Paternal Aunt   . Breast cancer Paternal Aunt   . Breast cancer Paternal Aunt   . Heart attack Maternal Grandfather 71  . Colon cancer Other        MGM's 5 brothers   ROS: All systems reviewed and negative except as per HPI.   BP 138/82   Pulse 82   Wt 261 lb (118.4 kg)   LMP 06/21/2010   SpO2 98%   BMI 47.74 kg/m  General: NAD Neck: No JVD, no thyromegaly or thyroid nodule.  Lungs: Clear to auscultation bilaterally with normal respiratory  effort. CV: Nondisplaced PMI.  Heart regular S1/S2, no S3/S4, no murmur.  No peripheral edema.  No carotid bruit.  Normal pedal pulses.  Abdomen: Soft, nontender, no hepatosplenomegaly, no distention.  Skin: Intact without lesions or rashes.  Neurologic: Alert and oriented x 3.  Psych: Normal affect. Extremities: No clubbing or cyanosis.  HEENT: Normal.   Assessment/Plan: Patient has breast cancer and will be getting therapy including Herceptin x 1 year.  We discussed the possible cardio-toxicity of Herceptin and the reasoning behind echo screening. I reviewed today's echo: EF and strain parameters were stable.  - Repeat echo + followup in 3 months.

## 2018-02-09 ENCOUNTER — Other Ambulatory Visit: Payer: Self-pay | Admitting: Oncology

## 2018-02-09 NOTE — Progress Notes (Signed)
I called the patient and left her voicemail with her results, which showed no definitive evidence of disease outside the breast and armpit

## 2018-02-09 NOTE — Progress Notes (Signed)
FMLA successfully faxed to [employer] at [phone], and [employer] at [phone]. Copies taken to Ms. Wilma at front desk for patient to pick up per request.

## 2018-02-13 ENCOUNTER — Encounter: Payer: Self-pay | Admitting: General Practice

## 2018-02-13 NOTE — Progress Notes (Unsigned)
[facility] Spiritual Care Note  Followed up with Joycelyn Schmid by phone as planned. She was overall in good spirits. Aside from the acute stress of a missing wallet, her most pressing concern is getting the port, in part because it will be a visible sign (literally under her nose) of dx/tx. Provided empathic listening, normalization of feelings, and assistance with pastoral reflection on the themes of vulnerability, privacy, transparency, etc. Following for support by phone, but please also page if immediate needs arise or circumstances change. Thank you.   [place], [place], [facility] Pager [phone] Voicemail [phone]

## 2018-02-14 ENCOUNTER — Other Ambulatory Visit (HOSPITAL_COMMUNITY): Payer: BC Managed Care – PPO

## 2018-02-14 ENCOUNTER — Encounter (HOSPITAL_COMMUNITY): Payer: BC Managed Care – PPO

## 2018-02-15 ENCOUNTER — Encounter (HOSPITAL_COMMUNITY): Payer: Self-pay

## 2018-02-15 ENCOUNTER — Other Ambulatory Visit: Payer: Self-pay

## 2018-02-15 ENCOUNTER — Encounter (HOSPITAL_COMMUNITY)
Admission: RE | Admit: 2018-02-15 | Discharge: 2018-02-15 | Disposition: A | Discharge status: Home / Self Care | Payer: BC Managed Care – PPO | Source: Ambulatory Visit | Attending: General Surgery | Admitting: General Surgery

## 2018-02-15 DIAGNOSIS — Z01812 Encounter for preprocedural laboratory examination: Secondary | ICD-10-CM | POA: Insufficient documentation

## 2018-02-15 DIAGNOSIS — E119 Type 2 diabetes mellitus without complications: Secondary | ICD-10-CM | POA: Insufficient documentation

## 2018-02-15 DIAGNOSIS — Z01810 Encounter for preprocedural cardiovascular examination: Secondary | ICD-10-CM | POA: Insufficient documentation

## 2018-02-15 HISTORY — DX: Essential (primary) hypertension: I10

## 2018-02-15 HISTORY — DX: Other specified postprocedural states: R11.2

## 2018-02-15 HISTORY — DX: Other specified postprocedural states: Z98.890

## 2018-02-15 LAB — CBC
HCT: 42.2 % (ref 36.0–46.0)
Hemoglobin: 13.3 g/dL (ref 12.0–15.0)
MCH: 27.4 pg (ref 26.0–34.0)
MCHC: 31.5 g/dL (ref 30.0–36.0)
MCV: 87 fL (ref 78.0–100.0)
Platelets: 292 10*3/uL (ref 150–400)
RBC: 4.85 MIL/uL (ref 3.87–5.11)
RDW: 14.1 % (ref 11.5–15.5)
WBC: 5.1 10*3/uL (ref 4.0–10.5)

## 2018-02-15 LAB — BASIC METABOLIC PANEL
Anion gap: 10 (ref 5–15)
BUN: 7 mg/dL (ref 6–20)
CO2: 24 mmol/L (ref 22–32)
Calcium: 9.2 mg/dL (ref 8.9–10.3)
Chloride: 105 mmol/L (ref 98–111)
Creatinine, Ser: 0.66 mg/dL (ref 0.44–1.00)
GFR calc Af Amer: >60 mL/min (ref >60)
GFR calc non Af Amer: >60 mL/min (ref >60)
Glucose, Bld: 138 mg/dL — ABNORMAL HIGH (ref 70–99)
Potassium: 3.5 mmol/L (ref 3.5–5.1)
Sodium: 139 mmol/L (ref 135–145)

## 2018-02-15 LAB — HEMOGLOBIN A1C
Hgb A1c MFr Bld: 7.4 % — ABNORMAL HIGH (ref 4.8–5.6)
Mean Plasma Glucose: 165.68 mg/dL

## 2018-02-15 LAB — GLUCOSE, CAPILLARY: Glucose-Capillary: 136 mg/dL — ABNORMAL HIGH (ref 70–99)

## 2018-02-15 NOTE — Progress Notes (Signed)
PCP - Cathlean Cower Cardiologist - denies  Chest x-ray - not needed EKG - 02/15/18 Stress Test - denies ECHO - 01/2018 Cardiac Cath - denies  Fasting Blood Sugar - 90s Checks Blood Sugar __1___ times a week  Anesthesia review: no  Patient denies shortness of breath, fever, cough and chest pain at PAT appointment   Patient verbalized understanding of instructions that were given to them at the PAT appointment. Patient was also instructed that they will need to review over the PAT instructions again at home before surgery.

## 2018-02-15 NOTE — Pre-Procedure Instructions (Signed)
TRUDY KORY  02/15/2018      [facility] ([facility] [place] [address] [place] ([place]) [place] [address] Phone: [phone] Fax: [phone]  [facility] [place], [place] [address] Phone: [phone] Fax: [phone]    Your procedure is scheduled on August 1  Report to [facility] at 1000 A.M.  Call this number if you have problems the morning of surgery:  [phone]   Remember:  Do not eat after midnight.  You may drink clear liquids until 0900 .  Clear liquids allowed are:  Water, Clear Tea, Black Coffee only and Gatorade    Take these medicines the morning of surgery with A SIP OF WATER  albuterol (PROVENTIL HFA;VENTOLIN HFA)  Azelastine-Fluticasone (DYMISTA)  Beclomethasone Dipropionate (QNASL) elvitegravir-cobicistat-emtricitabine-tenofovir (STRIBILD)  Fluticasone-Salmeterol (ADVAIR DISKUS) traMADol (ULTRAM) valACYclovir (VALTREX)  7 days prior to surgery STOP taking any Aspirin(unless otherwise instructed by your surgeon), Aleve, Naproxen, Ibuprofen, Motrin, Advil, Goody's, BC's, all herbal medications, fish oil, and all vitamins   WHAT DO I DO ABOUT MY DIABETES MEDICATION?   Marland Kitchen Do not take oral diabetes medicines (pills) the morning of surgery. metFORMIN (GLUCOPHAGE-XR) and glipiZIDE (GLIPIZIDE XL)  . The day of surgery, do not take other diabetes injectables, including Byetta (exenatide), Bydureon (exenatide ER), Victoza (liraglutide), or Trulicity (dulaglutide).  . If your CBG is greater than 220 mg/dL, you may take  of your sliding scale (correction) dose of insulin.   How to Manage Your Diabetes Before and After Surgery  Why is it important to control my blood sugar before and after surgery? . Improving blood sugar levels before and after surgery helps healing and can limit problems. . A way of improving blood  sugar control is eating a healthy diet by: o  Eating less sugar and carbohydrates o  Increasing activity/exercise o  Talking with your doctor about reaching your blood sugar goals . High blood sugars (greater than 180 mg/dL) can raise your risk of infections and slow your recovery, so you will need to focus on controlling your diabetes during the weeks before surgery. . Make sure that the doctor who takes care of your diabetes knows about your planned surgery including the date and location.  How do I manage my blood sugar before surgery? . Check your blood sugar at least 4 times a day, starting 2 days before surgery, to make sure that the level is not too high or low. o Check your blood sugar the morning of your surgery when you wake up and every 2 hours until you get to the Short Stay unit. . If your blood sugar is less than 70 mg/dL, you will need to treat for low blood sugar: o Do not take insulin. o Treat a low blood sugar (less than 70 mg/dL) with  cup of clear juice (cranberry or apple), 4 glucose tablets, OR glucose gel. o Recheck blood sugar in 15 minutes after treatment (to make sure it is greater than 70 mg/dL). If your blood sugar is not greater than 70 mg/dL on recheck, call [phone] for further instructions. . Report your blood sugar to the short stay nurse when you get to Short Stay.  . If you are admitted to the hospital after surgery: o Your blood sugar will be checked by the staff and you will probably be given insulin after surgery (instead of oral diabetes  medicines) to make sure you have good blood sugar levels. o The goal for blood sugar control after surgery is 80-180 mg/dL.     Do not wear jewelry, make-up or nail polish.  Do not wear lotions, powders, or perfumes, or deodorant.  Do not shave 48 hours prior to surgery.    Do not bring valuables to the hospital.  [facility] is not responsible for any belongings or valuables.  Contacts, dentures or bridgework  may not be worn into surgery.  Leave your suitcase in the car.  After surgery it may be brought to your room.  For patients admitted to the hospital, discharge time will be determined by your treatment team.  Patients discharged the day of surgery will not be allowed to drive home.    Special instructions:   - Preparing For Surgery  Before surgery, you can play an important role. Because skin is not sterile, your skin needs to be as free of germs as possible. You can reduce the number of germs on your skin by washing with CHG (chlorahexidine gluconate) Soap before surgery.  CHG is an antiseptic cleaner which kills germs and bonds with the skin to continue killing germs even after washing.    Oral Hygiene is also important to reduce your risk of infection.  Remember - BRUSH YOUR TEETH THE MORNING OF SURGERY WITH YOUR REGULAR TOOTHPASTE  Please do not use if you have an allergy to CHG or antibacterial soaps. If your skin becomes reddened/irritated stop using the CHG.  Do not shave (including legs and underarms) for at least 48 hours prior to first CHG shower. It is OK to shave your face.  Please follow these instructions carefully.   1. Shower the NIGHT BEFORE SURGERY and the MORNING OF SURGERY with CHG.   2. If you chose to wash your hair, wash your hair first as usual with your normal shampoo.  3. After you shampoo, rinse your hair and body thoroughly to remove the shampoo.  4. Use CHG as you would any other liquid soap. You can apply CHG directly to the skin and wash gently with a scrungie or a clean washcloth.   5. Apply the CHG Soap to your body ONLY FROM THE NECK DOWN.  Do not use on open wounds or open sores. Avoid contact with your eyes, ears, mouth and genitals (private parts). Wash Face and genitals (private parts)  with your normal soap.  6. Wash thoroughly, paying special attention to the area where your surgery will be performed.  7. Thoroughly rinse your body  with warm water from the neck down.  8. DO NOT shower/wash with your normal soap after using and rinsing off the CHG Soap.  9. Pat yourself dry with a CLEAN TOWEL.  10. Wear CLEAN PAJAMAS to bed the night before surgery, wear comfortable clothes the morning of surgery  11. Place CLEAN SHEETS on your bed the night of your first shower and DO NOT SLEEP WITH PETS.    Day of Surgery:  Do not apply any deodorants/lotions.  Please wear clean clothes to the hospital/surgery center.   Remember to brush your teeth WITH YOUR REGULAR TOOTHPASTE.    Please read over the following fact sheets that you were given.

## 2018-02-16 NOTE — Progress Notes (Signed)
[facility]  [phone] [phone]     ID: Kelly Rowe DOB: 1971-04-26  MR#: [id number]  OTL#:[id number]  Patient Care Team: Biagio Borg, MD as PCP - [name], MD as Consulting Physician (General Surgery) Naylee Frankowski, [name], MD as Consulting Physician (Oncology) [name], MD as Attending Physician (Radiation Oncology) [name], NP as Nurse Practitioner (Obstetrics and Gynecology) Marlinda Mike, PA-C as Referring Physician (Physician Assistant) OTHER MD:   CHIEF COMPLAINT: Triple positive breast cancer  CURRENT TREATMENT: Awaiting definitive surgery   HISTORY OF CURRENT ILLNESS: "[name]" noticed bruising in her lateral right breast and palpated a mass  on 12/23/2017. She followed up with her gynecologist. She underwent unilateral right diagnostic mammography with tomography and right breast ultrasonography at [facility] on 01/05/2018 showing: breast density category B. There is an irregular highly suspicious mass within the right breast at the 9:30 o'clock upper outer quadrant, measuring 1.8 x 1.1 x 1.5 cm, and located 18 cm from the nipple,  Ultrasonography revealed a single morphologically abnormal lymph node in the RIGHT axilla, with cortical thickness of 6 mm.   Accordingly on 01/11/2018 she proceeded to biopsy of the right breast area in question as well as a suspicious lymph node. The pathology from this procedure showed ([id number]): Invasive ductal carcinoma grade III.  The lymph node biopsied was negative for carcinoma (concordant).. Prognostic indicators significant for: estrogen receptor, 60% positive with weak staining intensity and progesterone receptor, 10% positive with strong staining intensity. Proliferation marker Ki67 at 30%. HER2 amplified with ratios HER2/CEP17 signals 2.26 and average HER2 copies per cell 3.50  The patient's subsequent history is as detailed below.  INTERVAL  HISTORY: Doloros returns today for follow up and treatment of her triple positive breast cancer accompanied by her mother and sister. Elvina is scheduled for right lumpectomy on 02/23/2018 under Dr. Barry Dienes.  She also had a baseline echocardiogram on 02/07/2018 which showed an ejection fraction in the 55-60% range.   Since her last visit, she completed a bilateral breast MRI on 01/25/2018 showing: Biopsy-proven solitary malignancy in the upper-outer quadrant of the right breast measuring 1.6 cm greatest diameter. Two prominent right axillary lymph nodes and a prominent right retropectoral lymph node for which metastatic disease cannot be excluded. Three oval T2 bright structures seen along the internal mammary chain, likely reflecting lymph nodes that are asymmetric to the left side. The largest measures approximately 5 x 3 mm short axis. No MRI evidence of malignancy in the left breast  She completed staging studies with a bone scan on 02/06/2018 showing: Scattered degenerative type uptake with mild increased tracer localization at the LEFT lateral aspect of the distal sternum, slightly linear in appearance and nonspecific, appearance atypical for metastatic disease though not completely excluded.  She also completed a chest CT on 02/06/2018 showing: Right axillary adenopathy, indicative of metastatic disease. Borderline enlarged left axillary lymph nodes, nonspecific. No additional evidence of metastatic disease.    REVIEW OF SYSTEMS: Kelly Rowe reports that she has been working as usual. She denies unusual headaches, visual changes, nausea, vomiting, or dizziness. There has been no unusual cough, phlegm production, or pleurisy. This been no change in bowel or bladder habits. She denies unexplained fatigue or unexplained weight loss, bleeding, rash, or fever. A detailed review of systems was otherwise stable.    PAST MEDICAL HISTORY: Past Medical History:  Diagnosis Date  . 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 ANXIETY 11/27/2007  . Arthritis  both knees  . Asthma 02/25/2011  . Carbuncle and furuncle of trunk 04/16/2010  . Chlamydia infection 03/21/2008  . DIABETES MELLITUS, TYPE II 08/02/2007  . Edema 01/30/2010  . ELEVATED BLOOD PRESSURE WITHOUT DIAGNOSIS OF HYPERTENSION 08/02/2007  . Family history of breast cancer   . Family history of colon cancer   . Family history of lung cancer   . FREQUENCY, URINARY 05/19/2009  . GENITAL HERPES 03/12/2009  . HIV (human immunodeficiency virus infection) ([place]) 2009  . HIV INFECTION 03/12/2009  . HSV (herpes simplex virus) infection   . HYPERLIPIDEMIA 08/02/2007  . Hypertension   . Metrorrhagia 03/21/2008  . PONV (postoperative nausea and vomiting)   . RETENTION, URINE 05/19/2009  . Trichomonas infection 01/19/2010  . VITAMIN D DEFICIENCY 01/30/2010    PAST SURGICAL HISTORY: Past Surgical History:  Procedure Laterality Date  . ABDOMINAL HYSTERECTOMY  07/22/2010   TAH WITH PRESERVATION OF BOTH TUBES AND OVARIES  . CHOLECYSTECTOMY    . ENDOMETRIAL ABLATION  01/11/2008   HER OPTION  . ESOPHAGOGASTRODUODENOSCOPY    . MULTIPLE TOOTH EXTRACTIONS    . TUBAL LIGATION    . WISDOM TOOTH EXTRACTION      FAMILY HISTORY Family History  Problem Relation Age of Onset  . Prostate cancer Other   . Heart disease Other   . Stroke Other   . Diabetes Mother   . Hypertension Mother   . Diabetes Father   . Cancer Father        COLON and LU NG  . Stroke Paternal Uncle   . Breast cancer Paternal Aunt   . Lung cancer Maternal Grandmother 22       Mesothelioma  . Colon cancer Paternal Grandfather        dx over 89s  . Lung cancer Paternal Aunt   . Breast cancer Paternal Aunt   . Breast cancer Paternal Aunt   . Heart attack Maternal Grandfather 71  . Colon cancer Other        MGM's 5 brothers  The patient's father died at age 54 due to metastatic liver cancer. The patient's mother is alive at 47. The patient's has 1 brother and 3 sisters.  There was a paternal grandfather with colon cancer. There were 3 paternal aunts with breast cancer, 2 diagnosed in the 41's and 1 at age 16. There was a maternal grandmother with mesothelioma. The patient otherwise denies a history of ovarian cancer in the family.    GYNECOLOGIC HISTORY:  Patient's last menstrual period was 06/21/2010. Menarche: 47 years old Age at first live birth: 47 years old She is GXP5. Her LMP was December 2011. She is status post partial hysterectomy without oophorectomy. She took oral contraception for 3 years with no complications. She never took HRT.    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  At home are 2 of her sons, Glendell Docker and [name], and the patient's grandson, Amador Cunas 76 (who is Willie's son). The patient's oldest is [place] age 40 who lives in [place] as a cook. Daughter, [place] age 73 works as a Scientist, water quality. Son, Glendell Docker age 76 is disabled. Son, [name] age 64 lives in [place] as a Microbiologist. Daughter, Benard Rink age 53 also is a Microbiologist. The patient has 5 grandchildren and no great grandchildren. The patient does not belong to a church.       ADVANCED DIRECTIVES: Not in place; at the 01/18/2018 visit the patient was given the appropriate documents to complete on notarized at her discretion  HEALTH MAINTENANCE: Social History   Tobacco Use  . Smoking status: Never Smoker  . Smokeless tobacco: Never Used  Substance Use Topics  . Alcohol use: Yes    Alcohol/week: 0.0 oz    Comment: OCCASIONALLY  . Drug use: No     Colonoscopy: Not yet  PAP: November 2018  Bone density: Never   Allergies  Allergen Reactions  . Levaquin [Levofloxacin In D5w] Nausea And Vomiting and Rash  . Penicillins Swelling and Rash    Has patient had a PCN reaction causing immediate rash, facial/tongue/throat swelling, SOB or lightheadedness with hypotension: Yes Has patient had a PCN reaction causing severe rash involving mucus membranes or skin necrosis: No Has patient  had a PCN reaction that required hospitalization: Yes Has patient had a PCN reaction occurring within the last 10 years: No If all of the above answers are "NO", then may proceed with Cephalosporin use.    Current Outpatient Medications  Medication Sig Dispense Refill  . albuterol (PROVENTIL HFA;VENTOLIN HFA) 108 (90 Base) MCG/ACT inhaler Inhale 2 puffs into the lungs every 6 (six) hours as needed for wheezing or shortness of breath.    Marland Kitchen aspirin 81 MG tablet Take 81 mg by mouth daily as needed (chest pain).     . Azelastine-Fluticasone (DYMISTA) 137-50 MCG/ACT SUSP Use as directed 1 spray each side twice per day as needed (Patient taking differently: Place 1 spray into both nostrils 2 (two) times daily as needed (alleriges). ) 23 g 5  . Beclomethasone Dipropionate (QNASL) 80 MCG/ACT AERS Place 2 sprays into the nose daily. (Patient taking differently: Place 2 sprays into the nose daily as needed (congestion). ) 1 Inhaler 1  . blood glucose meter kit and supplies Dispense based on patient and insurance preference. Use up to four times daily as directed. (FOR ICD-10 E10.9, E11.9). 1 each 0  . Cholecalciferol (VITAMIN D) 2000 units tablet Take 2,000 Units by mouth daily.    . Diclofenac Sodium (PENNSAID) 2 % SOLN Place 1 application onto the skin 2 (two) times daily. (Patient taking differently: Place 1 application onto the skin 2 (two) times daily as needed (pain). ) 1 Bottle 3  . diphenhydrAMINE HCl (ZZZQUIL) 50 MG/30ML LIQD Take 50 mg by mouth at bedtime as needed (sleep).    . Dulaglutide (TRULICITY) 4.00 QQ/7.6PP SOPN Inject 0.75 mg into the skin once a week. 6 mL 11  . elvitegravir-cobicistat-emtricitabine-tenofovir (STRIBILD) 150-150-200-300 MG TABS tablet Take 1 tablet daily by mouth.    . ferrous gluconate (IRON 27) 240 (27 FE) MG tablet Take 480 mg by mouth daily.    . fluconazole (DIFLUCAN) 100 MG tablet Take 1 tablet (100 mg total) by mouth daily. (Patient not taking: Reported on  02/13/2018) 15 tablet 0  . Fluticasone-Salmeterol (ADVAIR DISKUS) 250-50 MCG/DOSE AEPB Inhale 1 puff into the lungs 2 (two) times daily. (Patient not taking: Reported on 02/13/2018) 1 each 11  . glipiZIDE (GLIPIZIDE XL) 5 MG 24 hr tablet Take 1 tablet (5 mg total) by mouth daily with breakfast. 90 tablet 3  . ibuprofen (ADVIL,MOTRIN) 800 MG tablet Take 1 tablet (800 mg total) by mouth every 8 (eight) hours as needed. (Patient taking differently: Take 800 mg by mouth daily as needed for moderate pain. ) 60 tablet 0  . metFORMIN (GLUCOPHAGE-XR) 500 MG 24 hr tablet Take 2 tablets (1,000 mg total) by mouth daily with breakfast. Annual appt is due must see provider for future refills 180 tablet 3  . Multiple Vitamin (  MULTIVITAMIN WITH MINERALS) TABS tablet Take 1 tablet by mouth daily.    . polyethylene glycol (MIRALAX / GLYCOLAX) packet Take 17 g by mouth daily as needed for moderate constipation.    . rosuvastatin (CRESTOR) 40 MG tablet Take 40 mg by mouth every evening.    . sulfamethoxazole-trimethoprim (BACTRIM DS) 800-160 MG tablet Take 1 tablet by mouth 2 (two) times daily. (Patient not taking: Reported on 02/13/2018) 6 tablet 0  . traMADol (ULTRAM) 50 MG tablet Take 50 mg by mouth daily as needed for moderate pain.    . valACYclovir (VALTREX) 500 MG tablet Take twice daily for 3-5 days (Patient taking differently: Take 500 mg by mouth 2 (two) times daily as needed (fever blisters). Take for 3-5 days) 30 tablet 12  . zolpidem (AMBIEN) 10 MG tablet Take 10 mg by mouth at bedtime as needed for sleep.     No current facility-administered medications for this visit.     OBJECTIVE: Morbidly obese African-American woman who appears well  Vitals:   02/17/18 1016  BP: (!) 157/98  Pulse: 80  Resp: 18  Temp: 98.6 F (37 C)  SpO2: 100%     Body mass index is 47.54 kg/m.   Wt Readings from Last 3 Encounters:  02/17/18 259 lb 14.4 oz (117.9 kg)  02/15/18 260 lb (117.9 kg)  02/07/18 261 lb (118.4 kg)       ECOG FS:0 - Asymptomatic  Sclerae unicteric, EOMs intact Oropharynx clear and moist No cervical or supraclavicular adenopathy Lungs no rales or rhonchi Heart regular rate and rhythm Abd soft, nontender, positive bowel sounds MSK no focal spinal tenderness, no upper extremity lymphedema Neuro: nonfocal, well oriented, appropriate affect Breasts: I do not palpate a mass in either breast or either axilla.  LAB RESULTS:  CMP     Component Value Date/Time   NA 139 02/15/2018 1306   K 3.5 02/15/2018 1306   CL 105 02/15/2018 1306   CO2 24 02/15/2018 1306   GLUCOSE 138 (H) 02/15/2018 1306   BUN 7 02/15/2018 1306   CREATININE 0.66 02/15/2018 1306   CREATININE 0.80 01/18/2018 1227   CALCIUM 9.2 02/15/2018 1306   PROT 8.0 01/18/2018 1227   ALBUMIN 3.8 01/18/2018 1227   AST 18 01/18/2018 1227   ALT 18 01/18/2018 1227   ALKPHOS 144 (H) 01/18/2018 1227   BILITOT 0.3 01/18/2018 1227   GFRNONAA >60 02/15/2018 1306   GFRNONAA >60 01/18/2018 1227   GFRAA >60 02/15/2018 1306   GFRAA >60 01/18/2018 1227    No results found for: TOTALPROTELP, ALBUMINELP, A1GS, A2GS, BETS, BETA2SER, GAMS, MSPIKE, SPEI  No results found for: KPAFRELGTCHN, LAMBDASER, KAPLAMBRATIO  Lab Results  Component Value Date   WBC 5.1 02/15/2018   NEUTROABS 2.4 01/18/2018   HGB 13.3 02/15/2018   HCT 42.2 02/15/2018   MCV 87.0 02/15/2018   PLT 292 02/15/2018    _0 @  No results found for: LABCA2  No components found for: [id number]  No results for input(s): INR in the last 168 hours.  No results found for: LABCA2  No results found for: NKN397  No results found for: QBH419  No results found for: FXT024  No results found for: CA2729  No components found for: HGQUANT  No results found for: CEA1 / No results found for: CEA1   No results found for: AFPTUMOR  No results found for: Curran  No results found for: [facility] Outpatient Visit on 02/15/2018  Component Date  Value Ref Range  Status  . Glucose-Capillary 02/15/2018 136* 70 - 99 mg/dL Final  . Hgb A1c MFr Bld 02/15/2018 7.4* 4.8 - 5.6 % Final   Comment: (NOTE) Pre diabetes:          5.7%-6.4% Diabetes:              >6.4% Glycemic control for   <7.0% adults with diabetes   . Mean Plasma Glucose 02/15/2018 165.68  mg/dL Final   Performed at [facility] [address], [place], [place] [address]  . Sodium 02/15/2018 139  135 - 145 mmol/L Final  . Potassium 02/15/2018 3.5  3.5 - 5.1 mmol/L Final  . Chloride 02/15/2018 105  98 - 111 mmol/L Final  . CO2 02/15/2018 24  22 - 32 mmol/L Final  . Glucose, Bld 02/15/2018 138* 70 - 99 mg/dL Final  . BUN 02/15/2018 7  6 - 20 mg/dL Final  . Creatinine, Ser 02/15/2018 0.66  0.44 - 1.00 mg/dL Final  . Calcium 02/15/2018 9.2  8.9 - 10.3 mg/dL Final  . GFR calc non Af Amer 02/15/2018 >60  >60 mL/min Final  . GFR calc Af Amer 02/15/2018 >60  >60 mL/min Final   Comment: (NOTE) The eGFR has been calculated using the CKD EPI equation. This calculation has not been validated in all clinical situations. eGFR's persistently <60 mL/min signify possible Chronic Kidney Disease.   . Anion gap 02/15/2018 10  5 - 15 Final   Performed at [facility], [place] [address] [place], [place] [address]  . WBC 02/15/2018 5.1  4.0 - 10.5 K/uL Final  . RBC 02/15/2018 4.85  3.87 - 5.11 MIL/uL Final  . Hemoglobin 02/15/2018 13.3  12.0 - 15.0 g/dL Final  . HCT 02/15/2018 42.2  36.0 - 46.0 % Final  . MCV 02/15/2018 87.0  78.0 - 100.0 fL Final  . MCH 02/15/2018 27.4  26.0 - 34.0 pg Final  . MCHC 02/15/2018 31.5  30.0 - 36.0 g/dL Final  . RDW 02/15/2018 14.1  11.5 - 15.5 % Final  . Platelets 02/15/2018 292  150 - 400 K/uL Final   Performed at [facility], [place] [address] [place], [place] [address]    (this displays the last labs from the last 3 days)  No results found for: TOTALPROTELP, ALBUMINELP, A1GS, A2GS, BETS, BETA2SER, GAMS, MSPIKE, SPEI (this displays  SPEP labs)  No results found for: KPAFRELGTCHN, LAMBDASER, KAPLAMBRATIO (kappa/lambda light chains)  No results found for: HGBA, HGBA2QUANT, HGBFQUANT, HGBSQUAN (Hemoglobinopathy evaluation)   No results found for: LDH  Lab Results  Component Value Date   IRON 35 (L) 01/30/2010   IRONPCTSAT 8.8 (L) 01/30/2010   (Iron and TIBC)  No results found for: FERRITIN  Urinalysis    Component Value Date/Time   COLORURINE DARK YELLOW 01/30/2018 1534   APPEARANCEUR CLOUDY (A) 01/30/2018 1534   LABSPEC 1.025 01/30/2018 1534   PHURINE 5.5 01/30/2018 1534   GLUCOSEU NEGATIVE 01/30/2018 1534   GLUCOSEU NEGATIVE 09/27/2017 1002   HGBUR 1+ (A) 01/30/2018 1534   BILIRUBINUR NEGATIVE 09/27/2017 1002   KETONESUR TRACE (A) 01/30/2018 1534   PROTEINUR TRACE (A) 01/30/2018 1534   UROBILINOGEN 0.2 09/27/2017 1002   NITRITE NEGATIVE 09/27/2017 1002   LEUKOCYTESUR NEGATIVE 09/27/2017 1002     STUDIES: Ct Chest W Contrast  Result Date: 02/06/2018 CLINICAL DATA:  Right breast cancer. EXAM: CT CHEST WITH CONTRAST TECHNIQUE: Multidetector CT imaging of the chest was performed during intravenous contrast administration. CONTRAST:  37m  OMNIPAQUE IOHEXOL 300 MG/ML  SOLN COMPARISON:  Bone scan 02/06/2018. FINDINGS: Cardiovascular: Vascular structures are unremarkable. Heart is mildly enlarged. No pericardial effusion. Mediastinum/Nodes: Mediastinal lymph nodes are not enlarged by CT size criteria. No hilar adenopathy. Right axillary lymph nodes measure up to 2.1 x 3.0 cm. Partially imaged left axillary lymph nodes measure up to 10 mm. Esophagus is grossly unremarkable. Lungs/Pleura: Lungs are clear. No pleural fluid. Airway is unremarkable. Upper Abdomen: Visualized portions of the liver, adrenal glands, left kidney, spleen, pancreas and stomach are grossly unremarkable. Musculoskeletal: No worrisome lytic or sclerotic lesions. There is no CT correlate in the sternum to correspond to the questioned  abnormality on today's bone scan. IMPRESSION: 1. Right axillary adenopathy, indicative of metastatic disease. 2. Borderline enlarged left axillary lymph nodes, nonspecific. 3. No additional evidence of metastatic disease. Electronically Signed   By: Lorin Picket M.D.   On: 02/06/2018 18:09   Nm Bone Scan Whole Body  Result Date: 02/06/2018 CLINICAL DATA:  Breast cancer, staging EXAM: NUCLEAR MEDICINE WHOLE BODY BONE SCAN TECHNIQUE: Whole body anterior and posterior images were obtained approximately 3 hours after intravenous injection of radiopharmaceutical. RADIOPHARMACEUTICALS:  20.8 mCi Technetium-32mMDP IV COMPARISON:  None Radiographic correlation: None recent FINDINGS: Increased tracer accumulation at the shoulders, knees and feet, typically degenerative. Abnormal increased tracer localization at the LEFT lateral aspect of the distal sternum, nonspecific, appearance atypical for metastasis though not excluded. Uptake bilaterally at the lower lumbar spine typically degenerative. No additional sites of abnormal osseous tracer accumulation are identified. Expected urinary tract and soft tissue distribution of tracer. IMPRESSION: Scattered degenerative type uptake with mild increased tracer localization at the LEFT lateral aspect of the distal sternum, slightly linear in appearance and nonspecific, appearance atypical for metastatic disease though not completely excluded. Electronically Signed   By: MLavonia DanaM.D.   On: 02/06/2018 17:02   Mr Breast Bilateral W Wo Contrast Inc Cad  Result Date: 01/27/2018 CLINICAL DATA:  47year old patient recently had a right breast ultrasound-guided biopsy and a right axillary lymph node ultrasound-guided biopsy. Pathology showed grade 3 invasive ductal carcinoma of the right breast at 9:30 o'clock position with a ribbon clip placed. Pathology of the right axillary lymph node showed no evidence of carcinoma. LABS:  None obtained EXAM: BILATERAL BREAST MRI WITH AND  WITHOUT CONTRAST TECHNIQUE: Multiplanar, multisequence MR images of both breasts were obtained prior to and following the intravenous administration of 20 ml of MultiHance. THREE-DIMENSIONAL MR IMAGE RENDERING ON INDEPENDENT WORKSTATION: Three-dimensional MR images were rendered by post-processing of the original MR data on an independent workstation. The three-dimensional MR images were interpreted, and findings are reported in the following complete MRI report for this study. Three dimensional images were evaluated at the independent DynaCad workstation COMPARISON:  Previous exam(s). FINDINGS: Breast composition: b. Scattered fibroglandular tissue. Background parenchymal enhancement: Mild Right breast: In the middle third of the upper-outer quadrant of the right breast is an irregular heterogeneously enhancing mass measuring 1.5 x 0.8 x 1.6 cm. This mass is T2 bright. There some adjacent biopsy changes. Biopsy marker clip is seen within the mass. No additional mass or suspicious enhancement is identified in the right breast to suggest malignancy. Left breast: No mass or abnormal enhancement to suggest malignancy. There is a mammographically stable intramammary lymph node in the lower outer quadrant of the left breast. Lymph nodes: In the high right axilla just lateral to the pectoralis muscle is a 2.9 x 2.0 cm axial diameter lymph node with attenuation  of the fatty hilum. Metastatic involvement cannot be excluded. This is the largest lymph node visualized. More inferiorly in the right axilla is a Level 1 mildly prominent lymph node measuring 1.5 x 1.3 cm axial diameter. There is a right retropectoral lymph node seen on axial STIR image 23/79 measuring 1.7 cm greatest diameter. On the axial STIR images, approximately 3 right probable internal mammary chain lymph nodes are visible, the largest measuring 5 x 3 mm short axis on image 44 out of 79. No left axillary lymphadenopathy identified. Ancillary findings:  None.  IMPRESSION: Biopsy-proven solitary malignancy in the upper-outer quadrant of the right breast measuring 1.6 cm greatest diameter. Two prominent right axillary lymph nodes and a prominent right retropectoral lymph node for which metastatic disease cannot be excluded. Three oval T2 bright structures seen along the internal mammary chain, likely reflecting lymph nodes that are asymmetric to the left side. The largest measures approximately 5 x 3 mm short axis. No MRI evidence of malignancy in the left breast. RECOMMENDATION: Continue treatment planning. BI-RADS CATEGORY  6: Known biopsy-proven malignancy. Electronically Signed   By: Curlene Dolphin M.D.   On: 01/27/2018 17:05    ELIGIBLE FOR AVAILABLE RESEARCH PROTOCOL: no  ASSESSMENT: 47 y.o. Kelly Rowe, [name] woman status post right breast upper outer quadrant biopsy 01/11/2018 for a clinical T1 N0, stage IA invasive ductal carcinoma, grade 3, estrogen and progesterone receptor positive, HER-2 amplified, with an MIB-1 of 30%.  (1) definitive surgery pending  (2) adjuvant chemotherapy to follow  (3) anti-HER-2 immunotherapy to start concurrently with chemotherapy  (a) baseline echocardiogram on 02/07/2018 showed an ejection fraction in the 55-60% range  (4) adjuvant radiation as appropriate  (5) antiestrogens to follow at the completion of local treatment  (6) genetics testing scheduled for 01/19/2018  PLAN: Martasia is ready for her surgery and today we went over all her staging scans.  Luckily this show no evidence of disease in her liver lungs or bones.  She has right axillary adenopathy.  However she also has left axillary adenopathy.  She could have sarcoid, she could have old hidradenitis, she could have adenopathy for other reasons, or she could have right-sided lymph nodes from breast cancer and left-sided lymph node for another reason  Ensured at this point I cannot predict whether she is going to need carboplatin/Taxotere or simply Taxol  with her Herceptin.  Similarly I do not know if she will need Perjeta or not.  Accordingly I have made her a return appointment with me 4 03/03/2018.  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  I think this is appropriate.  I do not think she would be able to safely continue her job while receiving chemotherapy.  She knows to call for any other issues that may develop before the next visit.    Jiovanny Burdell, [name], MD  02/17/18 10:40 AM Medical Oncology and Hematology [facility] [address] [place], [place] [address] Tel. [phone]    Fax. [phone]  Alice Rieger, am acting as scribe for Chauncey Cruel MD.  I, Lurline Del MD, have reviewed the above documentation for accuracy and completeness, and I agree with the above.

## 2018-02-17 ENCOUNTER — Inpatient Hospital Stay: Payer: BC Managed Care – PPO | Attending: Oncology | Admitting: Oncology

## 2018-02-17 ENCOUNTER — Telehealth: Payer: Self-pay | Admitting: *Deleted

## 2018-02-17 ENCOUNTER — Telehealth: Payer: Self-pay | Admitting: Oncology

## 2018-02-17 VITALS — BP 157/98 | HR 80 | Temp 98.6°F | Resp 18 | Ht 62.0 in | Wt 259.9 lb

## 2018-02-17 DIAGNOSIS — Z7982 Long term (current) use of aspirin: Secondary | ICD-10-CM | POA: Diagnosis not present

## 2018-02-17 DIAGNOSIS — C50411 Malignant neoplasm of upper-outer quadrant of right female breast: Secondary | ICD-10-CM | POA: Diagnosis not present

## 2018-02-17 DIAGNOSIS — E119 Type 2 diabetes mellitus without complications: Secondary | ICD-10-CM | POA: Diagnosis not present

## 2018-02-17 DIAGNOSIS — C50911 Malignant neoplasm of unspecified site of right female breast: Secondary | ICD-10-CM

## 2018-02-17 DIAGNOSIS — C773 Secondary and unspecified malignant neoplasm of axilla and upper limb lymph nodes: Secondary | ICD-10-CM | POA: Diagnosis not present

## 2018-02-17 DIAGNOSIS — Z170 Estrogen receptor positive status [ER+]: Secondary | ICD-10-CM | POA: Diagnosis not present

## 2018-02-17 NOTE — Telephone Encounter (Signed)
Short term disability papers placed in pick up box.

## 2018-02-17 NOTE — Telephone Encounter (Signed)
Per GM, 7/26 los.

## 2018-02-22 ENCOUNTER — Ambulatory Visit
Admission: RE | Admit: 2018-02-22 | Discharge: 2018-02-22 | Disposition: A | Discharge status: Home / Self Care | Payer: BC Managed Care – PPO | Source: Ambulatory Visit | Attending: General Surgery | Admitting: General Surgery

## 2018-02-22 DIAGNOSIS — C50411 Malignant neoplasm of upper-outer quadrant of right female breast: Secondary | ICD-10-CM

## 2018-02-22 DIAGNOSIS — Z170 Estrogen receptor positive status [ER+]: Secondary | ICD-10-CM

## 2018-02-23 ENCOUNTER — Encounter (HOSPITAL_COMMUNITY)
Admission: RE | Admit: 2018-02-23 | Discharge: 2018-02-23 | Disposition: A | Discharge status: Home / Self Care | Payer: BC Managed Care – PPO | Source: Ambulatory Visit | Attending: General Surgery | Admitting: General Surgery

## 2018-02-23 ENCOUNTER — Encounter (HOSPITAL_COMMUNITY)
Admission: RE | Disposition: A | Discharge status: Home / Self Care | Payer: Self-pay | Source: Ambulatory Visit | Attending: General Surgery

## 2018-02-23 ENCOUNTER — Ambulatory Visit (HOSPITAL_COMMUNITY): Payer: BC Managed Care – PPO | Admitting: Certified Registered Nurse Anesthetist

## 2018-02-23 ENCOUNTER — Ambulatory Visit (HOSPITAL_COMMUNITY): Payer: BC Managed Care – PPO

## 2018-02-23 ENCOUNTER — Ambulatory Visit (HOSPITAL_COMMUNITY)
Admission: RE | Admit: 2018-02-23 | Discharge: 2018-02-23 | Disposition: A | Discharge status: Home / Self Care | Payer: BC Managed Care – PPO | Source: Ambulatory Visit | Attending: General Surgery | Admitting: General Surgery

## 2018-02-23 ENCOUNTER — Ambulatory Visit
Admission: RE | Admit: 2018-02-23 | Discharge: 2018-02-23 | Disposition: A | Discharge status: Home / Self Care | Payer: BC Managed Care – PPO | Source: Ambulatory Visit | Attending: General Surgery | Admitting: General Surgery

## 2018-02-23 ENCOUNTER — Encounter (HOSPITAL_COMMUNITY): Payer: Self-pay | Admitting: *Deleted

## 2018-02-23 DIAGNOSIS — C773 Secondary and unspecified malignant neoplasm of axilla and upper limb lymph nodes: Secondary | ICD-10-CM | POA: Insufficient documentation

## 2018-02-23 DIAGNOSIS — Z170 Estrogen receptor positive status [ER+]: Secondary | ICD-10-CM

## 2018-02-23 DIAGNOSIS — C50411 Malignant neoplasm of upper-outer quadrant of right female breast: Secondary | ICD-10-CM | POA: Diagnosis present

## 2018-02-23 DIAGNOSIS — Z95828 Presence of other vascular implants and grafts: Secondary | ICD-10-CM

## 2018-02-23 DIAGNOSIS — Z7984 Long term (current) use of oral hypoglycemic drugs: Secondary | ICD-10-CM | POA: Insufficient documentation

## 2018-02-23 DIAGNOSIS — Z6841 Body Mass Index (BMI) 40.0 and over, adult: Secondary | ICD-10-CM | POA: Insufficient documentation

## 2018-02-23 DIAGNOSIS — E119 Type 2 diabetes mellitus without complications: Secondary | ICD-10-CM | POA: Insufficient documentation

## 2018-02-23 DIAGNOSIS — I10 Essential (primary) hypertension: Secondary | ICD-10-CM | POA: Insufficient documentation

## 2018-02-23 DIAGNOSIS — E669 Obesity, unspecified: Secondary | ICD-10-CM | POA: Insufficient documentation

## 2018-02-23 DIAGNOSIS — Z419 Encounter for procedure for purposes other than remedying health state, unspecified: Secondary | ICD-10-CM

## 2018-02-23 HISTORY — PX: PORTACATH PLACEMENT: SHX2246

## 2018-02-23 HISTORY — PX: BREAST LUMPECTOMY WITH RADIOACTIVE SEED AND SENTINEL LYMPH NODE BIOPSY: SHX6550

## 2018-02-23 LAB — GLUCOSE, CAPILLARY
Glucose-Capillary: 124 mg/dL — ABNORMAL HIGH (ref 70–99)
Glucose-Capillary: 120 mg/dL — ABNORMAL HIGH (ref 70–99)
Glucose-Capillary: 152 mg/dL — ABNORMAL HIGH (ref 70–99)

## 2018-02-23 SURGERY — BREAST LUMPECTOMY WITH RADIOACTIVE SEED AND SENTINEL LYMPH NODE BIOPSY
Anesthesia: General | Site: Chest | Laterality: Right

## 2018-02-23 MED ORDER — ACETAMINOPHEN 325 MG PO TABS: 325.0000 mg | ORAL_TABLET | ORAL | Status: DC | PRN

## 2018-02-23 MED ORDER — BUPIVACAINE-EPINEPHRINE (PF) 0.5% -1:200000 IJ SOLN
INTRAMUSCULAR | Status: DC | PRN
  Administered 2018-02-23 (×6): 5 mL via PERINEURAL

## 2018-02-23 MED ORDER — OXYCODONE HCL 5 MG PO TABS
ORAL_TABLET | ORAL | Status: DC
Start: 2018-02-23 — End: 2018-02-23
  Filled 2018-02-23: qty 1

## 2018-02-23 MED ORDER — PROPOFOL 10 MG/ML IV BOLUS
INTRAVENOUS | Status: DC | PRN
  Administered 2018-02-23 (×2): 50 mg via INTRAVENOUS
  Administered 2018-02-23: 150 mg via INTRAVENOUS

## 2018-02-23 MED ORDER — KETOROLAC TROMETHAMINE 30 MG/ML IJ SOLN
INTRAMUSCULAR | Status: AC
  Filled 2018-02-23: qty 1

## 2018-02-23 MED ORDER — SODIUM CHLORIDE 0.9 % IJ SOLN
INTRAMUSCULAR | Status: AC
Start: 2018-02-23 — End: ?
  Filled 2018-02-23: qty 10

## 2018-02-23 MED ORDER — ACETAMINOPHEN 500 MG PO TABS
ORAL_TABLET | ORAL | Status: AC
  Administered 2018-02-23: 1000 mg
  Filled 2018-02-23: qty 2

## 2018-02-23 MED ORDER — CLINDAMYCIN PHOSPHATE 900 MG/50ML IV SOLN
900.0000 mg | INTRAVENOUS | Status: AC
  Administered 2018-02-23: 900 mg via INTRAVENOUS

## 2018-02-23 MED ORDER — DEXAMETHASONE SODIUM PHOSPHATE 4 MG/ML IJ SOLN
INTRAMUSCULAR | Status: DC | PRN
  Administered 2018-02-23: 5 mg via INTRAVENOUS

## 2018-02-23 MED ORDER — HEPARIN SOD (PORK) LOCK FLUSH 100 UNIT/ML IV SOLN
INTRAVENOUS | Status: AC
  Filled 2018-02-23: qty 5

## 2018-02-23 MED ORDER — BUPIVACAINE-EPINEPHRINE (PF) 0.25% -1:200000 IJ SOLN
INTRAMUSCULAR | Status: AC
  Filled 2018-02-23: qty 30

## 2018-02-23 MED ORDER — SODIUM CHLORIDE 0.9 % IV SOLN
INTRAVENOUS | Status: DC | PRN
  Administered 2018-02-23: 14:00:00

## 2018-02-23 MED ORDER — 0.9 % SODIUM CHLORIDE (POUR BTL) OPTIME
TOPICAL | Status: DC | PRN
  Administered 2018-02-23: 1000 mL

## 2018-02-23 MED ORDER — FENTANYL CITRATE (PF) 250 MCG/5ML IJ SOLN
INTRAMUSCULAR | Status: DC | PRN
  Administered 2018-02-23 (×7): 25 ug via INTRAVENOUS

## 2018-02-23 MED ORDER — FENTANYL CITRATE (PF) 100 MCG/2ML IJ SOLN: 100.0000 ug | Freq: Once | INTRAMUSCULAR | Status: DC

## 2018-02-23 MED ORDER — LIDOCAINE HCL 1 % IJ SOLN
INTRAMUSCULAR | Status: DC | PRN
  Administered 2018-02-23: 10 mL

## 2018-02-23 MED ORDER — LIDOCAINE HCL (PF) 1 % IJ SOLN
INTRAMUSCULAR | Status: AC
  Filled 2018-02-23: qty 30

## 2018-02-23 MED ORDER — OXYCODONE HCL 5 MG PO TABS: 5.0000 mg | ORAL_TABLET | Freq: Once | ORAL | Status: DC | PRN

## 2018-02-23 MED ORDER — ONDANSETRON HCL 4 MG/2ML IJ SOLN
INTRAMUSCULAR | Status: DC | PRN
  Administered 2018-02-23: 4 mg via INTRAVENOUS

## 2018-02-23 MED ORDER — METHYLENE BLUE 0.5 % INJ SOLN
INTRAVENOUS | Status: DC | PRN
  Administered 2018-02-23: 14:00:00 via INTRAMUSCULAR

## 2018-02-23 MED ORDER — CLINDAMYCIN PHOSPHATE 900 MG/50ML IV SOLN
INTRAVENOUS | Status: AC
  Filled 2018-02-23: qty 50

## 2018-02-23 MED ORDER — ACETAMINOPHEN 500 MG PO TABS: 1000.0000 mg | ORAL_TABLET | ORAL | Status: DC

## 2018-02-23 MED ORDER — OXYCODONE HCL 5 MG PO TABS: 5.0000 mg | ORAL_TABLET | Freq: Four times a day (QID) | ORAL | 0 refills | Status: DC | PRN

## 2018-02-23 MED ORDER — HEPARIN SOD (PORK) LOCK FLUSH 100 UNIT/ML IV SOLN
INTRAVENOUS | Status: DC | PRN
  Administered 2018-02-23: 3 [IU]

## 2018-02-23 MED ORDER — MIDAZOLAM HCL 2 MG/2ML IJ SOLN: 2.0000 mg | Freq: Once | INTRAMUSCULAR | Status: DC

## 2018-02-23 MED ORDER — CHLORHEXIDINE GLUCONATE CLOTH 2 % EX PADS: 6.0000 | MEDICATED_PAD | Freq: Once | CUTANEOUS | Status: DC

## 2018-02-23 MED ORDER — TECHNETIUM TC 99M SULFUR COLLOID FILTERED
1.0000 | Freq: Once | INTRAVENOUS | Status: AC | PRN
  Administered 2018-02-23: 1 via INTRADERMAL

## 2018-02-23 MED ORDER — GABAPENTIN 300 MG PO CAPS: 300.0000 mg | ORAL_CAPSULE | ORAL | Status: DC

## 2018-02-23 MED ORDER — LIDOCAINE HCL (CARDIAC) PF 100 MG/5ML IV SOSY
PREFILLED_SYRINGE | INTRAVENOUS | Status: DC | PRN
  Administered 2018-02-23: 100 mg via INTRAVENOUS

## 2018-02-23 MED ORDER — PROPOFOL 10 MG/ML IV BOLUS
INTRAVENOUS | Status: AC
  Filled 2018-02-23: qty 20

## 2018-02-23 MED ORDER — METHYLENE BLUE 0.5 % INJ SOLN
INTRAVENOUS | Status: AC
  Filled 2018-02-23: qty 10

## 2018-02-23 MED ORDER — FENTANYL CITRATE (PF) 100 MCG/2ML IJ SOLN: 25.0000 ug | INTRAMUSCULAR | Status: DC | PRN

## 2018-02-23 MED ORDER — ACETAMINOPHEN 160 MG/5ML PO SOLN: 325.0000 mg | ORAL | Status: DC | PRN

## 2018-02-23 MED ORDER — SODIUM CHLORIDE 0.9 % IV SOLN
INTRAVENOUS | Status: AC
  Filled 2018-02-23: qty 1.2

## 2018-02-23 MED ORDER — ONDANSETRON HCL 4 MG/2ML IJ SOLN: 4.0000 mg | Freq: Once | INTRAMUSCULAR | Status: DC | PRN

## 2018-02-23 MED ORDER — CELECOXIB 200 MG PO CAPS: 200.0000 mg | ORAL_CAPSULE | ORAL | Status: DC

## 2018-02-23 MED ORDER — SCOPOLAMINE 1 MG/3DAYS TD PT72
MEDICATED_PATCH | TRANSDERMAL | Status: DC | PRN
  Administered 2018-02-23: 1 via TRANSDERMAL

## 2018-02-23 MED ORDER — MEPERIDINE HCL 50 MG/ML IJ SOLN: 6.2500 mg | INTRAMUSCULAR | Status: DC | PRN

## 2018-02-23 MED ORDER — MIDAZOLAM HCL 2 MG/2ML IJ SOLN
INTRAMUSCULAR | Status: AC
  Filled 2018-02-23: qty 2

## 2018-02-23 MED ORDER — OXYCODONE HCL 5 MG/5ML PO SOLN: 5.0000 mg | Freq: Once | ORAL | Status: DC | PRN

## 2018-02-23 MED ORDER — LACTATED RINGERS IV SOLN
INTRAVENOUS | Status: DC
  Administered 2018-02-23 (×2): via INTRAVENOUS

## 2018-02-23 MED ORDER — CELECOXIB 200 MG PO CAPS
ORAL_CAPSULE | ORAL | Status: AC
  Administered 2018-02-23: 200 mg
  Filled 2018-02-23: qty 1

## 2018-02-23 MED ORDER — MIDAZOLAM HCL 2 MG/2ML IJ SOLN
INTRAMUSCULAR | Status: AC
  Administered 2018-02-23: 2 mg
  Filled 2018-02-23: qty 2

## 2018-02-23 MED ORDER — FENTANYL CITRATE (PF) 100 MCG/2ML IJ SOLN
INTRAMUSCULAR | Status: AC
  Administered 2018-02-23: 100 ug
  Filled 2018-02-23: qty 2

## 2018-02-23 MED ORDER — KETOROLAC TROMETHAMINE 30 MG/ML IJ SOLN
30.0000 mg | Freq: Once | INTRAMUSCULAR | Status: AC | PRN
  Administered 2018-02-23: 30 mg via INTRAVENOUS

## 2018-02-23 MED ORDER — FENTANYL CITRATE (PF) 250 MCG/5ML IJ SOLN
INTRAMUSCULAR | Status: AC
  Filled 2018-02-23: qty 5

## 2018-02-23 MED ORDER — GABAPENTIN 300 MG PO CAPS
ORAL_CAPSULE | ORAL | Status: AC
  Administered 2018-02-23: 300 mg
  Filled 2018-02-23: qty 1

## 2018-02-23 SURGICAL SUPPLY — 72 items
BAG DECANTER FOR FLEXI CONT (MISCELLANEOUS) ×3 IMPLANT
BINDER BREAST LRG (GAUZE/BANDAGES/DRESSINGS) IMPLANT
BLADE HEX COATED 2.75 (ELECTRODE) ×3 IMPLANT
BNDG COHESIVE 4X5 TAN STRL (GAUZE/BANDAGES/DRESSINGS) ×3 IMPLANT
CANISTER SUCT 3000ML PPV (MISCELLANEOUS) ×3 IMPLANT
CHLORAPREP W/TINT 10.5 ML (MISCELLANEOUS) ×3 IMPLANT
CHLORAPREP W/TINT 26ML (MISCELLANEOUS) ×3 IMPLANT
CLIP VESOCCLUDE LG 6/CT (CLIP) ×3 IMPLANT
CLIP VESOCCLUDE MED 24/CT (CLIP) ×3 IMPLANT
CLIP VESOCCLUDE MED 6/CT (CLIP) ×6 IMPLANT
CLIP VESOCCLUDE SM WIDE 6/CT (CLIP) IMPLANT
CONT SPEC 4OZ CLIKSEAL STRL BL (MISCELLANEOUS) IMPLANT
COVER PROBE W GEL 5X96 (DRAPES) ×3 IMPLANT
COVER SURGICAL LIGHT HANDLE (MISCELLANEOUS) ×3 IMPLANT
COVER TRANSDUCER ULTRASND GEL (DRAPE) IMPLANT
CRADLE DONUT ADULT HEAD (MISCELLANEOUS) ×3 IMPLANT
DECANTER SPIKE VIAL GLASS SM (MISCELLANEOUS) ×6 IMPLANT
DERMABOND ADVANCED (GAUZE/BANDAGES/DRESSINGS) ×2
DERMABOND ADVANCED .7 DNX12 (GAUZE/BANDAGES/DRESSINGS) ×4 IMPLANT
DEVICE DUBIN SPECIMEN MAMMOGRA (MISCELLANEOUS) IMPLANT
DRAPE C-ARM 42X72 X-RAY (DRAPES) ×3 IMPLANT
DRAPE CHEST BREAST 15X10 FENES (DRAPES) ×3 IMPLANT
DRAPE UNIVERSAL PACK (DRAPES) ×3 IMPLANT
DRAPE UTILITY XL STRL (DRAPES) ×6 IMPLANT
DRAPE WARM FLUID 44X44 (DRAPE) IMPLANT
ELECT BLADE 4.0 EZ CLEAN MEGAD (MISCELLANEOUS) ×3
ELECT COATED BLADE 2.86 ST (ELECTRODE) ×3 IMPLANT
ELECT NEEDLE BLADE 2-5/6 (NEEDLE) ×3 IMPLANT
ELECT REM PT RETURN 9FT ADLT (ELECTROSURGICAL) ×3
ELECTRODE BLDE 4.0 EZ CLN MEGD (MISCELLANEOUS) ×2 IMPLANT
ELECTRODE REM PT RTRN 9FT ADLT (ELECTROSURGICAL) ×2 IMPLANT
GAUZE SPONGE 4X4 16PLY XRAY LF (GAUZE/BANDAGES/DRESSINGS) ×3 IMPLANT
GEL ULTRASOUND 20GR AQUASONIC (MISCELLANEOUS) IMPLANT
GLOVE BIO SURGEON STRL SZ 6 (GLOVE) ×6 IMPLANT
GLOVE BIO SURGEON STRL SZ7.5 (GLOVE) ×3 IMPLANT
GLOVE BIOGEL PI IND STRL 7.5 (GLOVE) ×2 IMPLANT
GLOVE BIOGEL PI INDICATOR 7.5 (GLOVE) ×1
GLOVE INDICATOR 6.5 STRL GRN (GLOVE) ×6 IMPLANT
GOWN STRL REUS W/ TWL LRG LVL3 (GOWN DISPOSABLE) ×2 IMPLANT
GOWN STRL REUS W/TWL 2XL LVL3 (GOWN DISPOSABLE) ×3 IMPLANT
GOWN STRL REUS W/TWL LRG LVL3 (GOWN DISPOSABLE) ×1
KIT BASIN OR (CUSTOM PROCEDURE TRAY) ×3 IMPLANT
KIT MARKER MARGIN INK (KITS) ×3 IMPLANT
KIT PORT POWER 8FR ISP CVUE (Port) ×3 IMPLANT
KIT TURNOVER KIT B (KITS) ×3 IMPLANT
LIGHT WAVEGUIDE WIDE FLAT (MISCELLANEOUS) ×3 IMPLANT
NDL SAFETY ECLIPSE 18X1.5 (NEEDLE) IMPLANT
NEEDLE FILTER BLUNT 18X 1/2SAF (NEEDLE)
NEEDLE FILTER BLUNT 18X1 1/2 (NEEDLE) IMPLANT
NEEDLE HYPO 18GX1.5 SHARP (NEEDLE)
NEEDLE HYPO 25GX1X1/2 BEV (NEEDLE) ×3 IMPLANT
NS IRRIG 1000ML POUR BTL (IV SOLUTION) ×3 IMPLANT
PACK SURGICAL SETUP 50X90 (CUSTOM PROCEDURE TRAY) ×3 IMPLANT
PACK UNIVERSAL I (CUSTOM PROCEDURE TRAY) ×3 IMPLANT
PAD ARMBOARD 7.5X6 YLW CONV (MISCELLANEOUS) ×3 IMPLANT
PENCIL BUTTON HOLSTER BLD 10FT (ELECTRODE) ×3 IMPLANT
SPONGE LAP 18X18 X RAY DECT (DISPOSABLE) ×3 IMPLANT
STOCKINETTE IMPERVIOUS 9X36 MD (GAUZE/BANDAGES/DRESSINGS) ×3 IMPLANT
SUT MNCRL AB 4-0 PS2 18 (SUTURE) ×12 IMPLANT
SUT MON AB 4-0 PC3 18 (SUTURE) ×3 IMPLANT
SUT PROLENE 2 0 SH DA (SUTURE) ×6 IMPLANT
SUT VIC AB 3-0 SH 27 (SUTURE) ×1
SUT VIC AB 3-0 SH 27X BRD (SUTURE) ×2 IMPLANT
SUT VIC AB 3-0 SH 8-18 (SUTURE) ×6 IMPLANT
SYR 5ML LUER SLIP (SYRINGE) ×3 IMPLANT
SYR BULB 3OZ (MISCELLANEOUS) ×3 IMPLANT
SYR CONTROL 10ML LL (SYRINGE) ×3 IMPLANT
TOWEL OR 17X24 6PK STRL BLUE (TOWEL DISPOSABLE) ×3 IMPLANT
TOWEL OR 17X26 10 PK STRL BLUE (TOWEL DISPOSABLE) ×3 IMPLANT
TRAY LAPAROSCOPIC MC (CUSTOM PROCEDURE TRAY) ×3 IMPLANT
TUBE CONNECTING 12X1/4 (SUCTIONS) ×3 IMPLANT
YANKAUER SUCT BULB TIP NO VENT (SUCTIONS) ×3 IMPLANT

## 2018-02-23 NOTE — Anesthesia Preprocedure Evaluation (Addendum)
Anesthesia Evaluation  Patient identified by MRN, date of birth, ID band Patient awake    Reviewed: Allergy & Precautions, NPO status   History of Anesthesia Complications (+) PONV  Airway Mallampati: II       Dental no notable dental hx. (+) Teeth Intact   Pulmonary    Pulmonary exam normal breath sounds clear to auscultation       Cardiovascular hypertension, Normal cardiovascular exam Rhythm:Regular Rate:Normal     Neuro/Psych    GI/Hepatic   Endo/Other  diabetes, Oral Hypoglycemic AgentsMorbid obesity  Renal/GU      Musculoskeletal   Abdominal (+) + obese,   Peds  Hematology   Anesthesia Other Findings Kelly Rowe  ECHOCARDIOGRAM COMPLETE  Order# [id number]  Reading physician: Larey Dresser, MD Ordering physician: Chauncey Cruel, MD Study date: 02/07/18 Study Result   Result status: Final result                             *[facility]*                         [address], [place] [address]                            [phone]  ------------------------------------------------------------------- Transthoracic Echocardiography  Patient:    Kelly Rowe, Kelly Rowe MR #:       [id number] Study Date: 02/07/2018 Gender:     F Age:        47 Height:     157.5 cm Weight:     120.4 kg BSA:        2.37 m^2 Pt. Status: Room:   ORDERING     [name], [name]  REFERRING    [name], Wallis Bamberg  ATTENDING    Loralie Champagne, M.D.  [place], RDCS  PERFORMING   [facility], Outpatient  cc:  ------------------------------------------------------------------- LV EF: 55% -   60%  ------------------------------------------------------------------- Indications:      V58.11 Chemotherapy  Evaluation.  ------------------------------------------------------------------- History:   PMH:  Baseline echo. Breast cancer.  ------------------------------------------------------------------- Study Conclusions  - Left ventricle: The cavity size was normal. Wall thickness was   increased in a pattern of mild LVH. Systolic function was normal.   The estimated ejection fraction was in the range of 55% to 60%.   GLS -17.9%. Wall motion was normal; there were no regional wall   motion abnormalities. Doppler parameters are consistent with   abnormal left ventricular relaxation (grade 1 diastolic   dysfunction). - Aortic valve: There was no stenosis. - Mitral valve: There was trivial regurgitation. - Left atrium    Reproductive/Obstetrics                            Anesthesia Physical Anesthesia Plan  ASA: III  Anesthesia Plan: General   Post-op Pain Management:  Regional for Post-op pain   Induction:   PONV Risk Score and Plan: 4 or greater and Ondansetron, Midazolam, Scopolamine patch - Pre-op and Treatment may  vary due to age or medical condition  Airway Management Planned: LMA  Additional Equipment:   Intra-op Plan:   Post-operative Plan: Extubation in OR  Informed Consent: I have reviewed the patients History and Physical, chart, labs and discussed the procedure including the risks, benefits and alternatives for the proposed anesthesia with the patient or authorized representative who has indicated his/her understanding and acceptance.   Dental advisory given  Plan Discussed with: CRNA and Surgeon  Anesthesia Plan Comments:         Anesthesia Quick Evaluation

## 2018-02-23 NOTE — H&P (Signed)
Kelly Rowe  Location: [facility] Patient #: [id number] DOB: [birth year] Undefined / Language: Cleophus Molt / Race: Black or African American Female   History of Present Illness The patient is a 47 year old female who presents with breast cancer. Pt is a 47 yo F referred for consultation at the request of Dr. Owens Shark for a new diagnosis of right breast cancer. She presented wtih a painful area in her right breast that was found to be palpable. Dx imaging was performed and showed a 1.8 cm mass at 9:30. Core needle biopsy was performed and showed a grade 3 invasive ductal carcinoma, ER/PR positive, Her 2 positive, ki 67 30%.   She has a significant family history for cancer. She had two paternal aunts with breast cancer, paternal grandfather with colon cancer, and mother and paternal aunt with lung cancer. She is up to date with pap smear and has been having mammograms since age 85. She is accompanied by daughter, two sisters, mother, and brother in law.    rt dx mammo/us 01/05/18 ACR Breast Density Category b: There are scattered areas of fibroglandular density.  FINDINGS: There is a new irregular mass within the upper RIGHT breast, measuring approximately 1.5 cm greatest dimension, corresponding to the area of clinical concern with overlying skin marker in place.  There are no new dominant masses, suspicious calcifications or secondary signs of malignancy elsewhere within the RIGHT breast.  Mammographic images were processed with CAD.  Targeted ultrasound is performed, showing an irregular hypoechoic mass in the RIGHT breast at the 9:30 o'clock axis, 18 cm from the nipple, measuring 1.8 x 1.1 x 1.5 cm, corresponding to the area of clinical concern.  A single morphologically abnormal lymph node is identified in the RIGHT axilla, with cortical thickness of 6 mm.  IMPRESSION: 1. Highly suspicious mass within the RIGHT breast at the 9:30 o'clock axis, 18 cm from  the nipple, measuring 1.8 cm, corresponding to the site of patient's palpable lump. Ultrasound-guided biopsy is recommended. 2. Single morphologically abnormal lymph node in the RIGHT axilla, with cortical thickness of 6 mm. This is also a suspicious finding for which ultrasound-guided biopsy is recommended.  RECOMMENDATION: 1. Ultrasound-guided biopsy of the RIGHT breast mass at the 9:30 o'clock axis. 2. Ultrasound-guided biopsy of the abnormal lymph node in the RIGHT axilla.  Ultrasound-guided biopsies are scheduled for June 19th.  I have discussed the findings and recommendations with the patient. Results were also provided in writing at the conclusion of the visit. If applicable, a reminder letter will be sent to the patient regarding the next appointment.  BI-RADS CATEGORY 4: Suspicious.  pathology 01/11/18 Diagnosis 1. Breast, right, needle core biopsy, 9:30 o'clock ribbon clip - INVASIVE DUCTAL CARCINOMA. - SEE COMMENT. 2. Lymph node, needle/core biopsy, right axillary ln spiral hydromark clip - THERE IS NO EVIDENCE OF CARCINOMA IN 1 OF 1 LYMPH NODE (0/1). Microscopic Comment 1. The carcinoma appears grade III. Estrogen Receptor: 60%, POSITIVE, WEAK STAINING INTENSITY Progesterone Receptor: 10%, POSITIVE, STRONG STAINING INTENSITY Proliferation Marker Ki67: 30%  CMET and CBC essentially normal except for mildly elevated glucose at 140 and alk phos 144.   Past Surgical History Breast Biopsy  Right. Gallbladder Surgery - Laparoscopic  Hysterectomy (not due to cancer) - Partial  Oral Surgery   Diagnostic Studies History  Colonoscopy  never Mammogram  within last year  Social History Caffeine use  Coffee. No alcohol use  No drug use  Tobacco use  Never smoker.  Family  History  Arthritis  Mother. Breast Cancer  Family Members In General. Cerebrovascular Accident  Family Members In [place]  Father. Diabetes Mellitus  Family  Members In General, Father, Mother. Heart Disease  Family Members In General. Hypertension  Family Members In General, Mother. Respiratory Condition  Father.  Pregnancy / Birth History  Age at menarche  3 years. Age of menopause  <43 Contraceptive History  Oral contraceptives. Gravida  5 Irregular periods  Maternal age  30-20 Para  5  Other Problems Arthritis  Asthma  Breast Cancer  Cholelithiasis  Diabetes Mellitus  Hemorrhoids  High blood pressure  HIV-positive  Hypercholesterolemia  Lump In Breast  Myocardial infarction     Review of Systems General Present- Night Sweats. Not Present- Appetite Loss, Chills, Fatigue, Fever, Weight Gain and Weight Loss. Skin Not Present- Change in Wart/Mole, Dryness, Hives, Jaundice, New Lesions, Non-Healing Wounds, Rash and Ulcer. HEENT Present- Wears glasses/contact lenses. Not Present- Earache, Hearing Loss, Hoarseness, Nose Bleed, Oral Ulcers, Ringing in the Ears, Seasonal Allergies, Sinus Pain, Sore Throat, Visual Disturbances and Yellow Eyes. Respiratory Present- Snoring. Not Present- Bloody sputum, Chronic Cough, Difficulty Breathing and Wheezing. Breast Present- Breast Mass, Breast Pain and Skin Changes. Not Present- Nipple Discharge. Cardiovascular Present- Swelling of Extremities. Not Present- Chest Pain, Difficulty Breathing Lying Down, Leg Cramps, Palpitations, Rapid Heart Rate and Shortness of Breath. Gastrointestinal Present- Hemorrhoids. Not Present- Abdominal Pain, Bloating, Bloody Stool, Change in Bowel Habits, Chronic diarrhea, Constipation, Difficulty Swallowing, Excessive gas, Gets full quickly at meals, Indigestion, Nausea, Rectal Pain and Vomiting. Female Genitourinary Not Present- Frequency, Nocturia, Painful Urination, Pelvic Pain and Urgency. Musculoskeletal Present- Joint Stiffness. Not Present- Back Pain, Joint Pain, Muscle Pain, Muscle Weakness and Swelling of Extremities. Neurological Not  Present- Decreased Memory, Fainting, Headaches, Numbness, Seizures, Tingling, Tremor, Trouble walking and Weakness. Psychiatric Present- Change in Sleep Pattern. Not Present- Anxiety, Bipolar, Depression, Fearful and Frequent crying. Endocrine Present- Hot flashes. Not Present- Cold Intolerance, Excessive Hunger, Hair Changes, Heat Intolerance and New Diabetes. Hematology Present- Easy Bruising and HIV. Not Present- Blood Thinners, Excessive bleeding, Gland problems and Persistent Infections.   Physical Exam  General Mental Status-Alert. General Appearance-Consistent with stated age. Hydration-Well hydrated. Voice-Normal.  Head and Neck Head-normocephalic, atraumatic with no lesions or palpable masses. Trachea-midline. Thyroid Gland Characteristics - normal size and consistency.  Eye Eyeball - Bilateral-Extraocular movements intact. Sclera/Conjunctiva - Bilateral-No scleral icterus.  Chest and Lung Exam Chest and lung exam reveals -quiet, even and easy respiratory effort with no use of accessory muscles and on auscultation, normal breath sounds, no adventitious sounds and normal vocal resonance. Inspection Chest Wall - Normal. Back - normal.  Breast Note: vaguely palpable mass lateral right breast around 1.5 cm. faint bruising. ptotic breasts bilaterally. no other findings such as nipple retraction or skin dimpling. no palpable LAD. no discharge.   Cardiovascular Cardiovascular examination reveals -normal heart sounds, regular rate and rhythm with no murmurs and normal pedal pulses bilaterally.  Abdomen Inspection Inspection of the abdomen reveals - No Hernias. Palpation/Percussion Palpation and Percussion of the abdomen reveal - Soft, Non Tender, No Rebound tenderness, No Rigidity (guarding) and No hepatosplenomegaly. Auscultation Auscultation of the abdomen reveals - Bowel sounds normal.  Neurologic Neurologic evaluation reveals -alert and oriented  x 3 with no impairment of recent or remote memory. Mental Status-Normal.  Musculoskeletal Global Assessment -Note: no gross deformities.  Normal Exam - Left-Upper Extremity Strength Normal and Lower Extremity Strength Normal. Normal Exam - Right-Upper Extremity Strength Normal and Lower  Extremity Strength Normal.  Lymphatic Head & Neck  General Head & Neck Lymphatics: Bilateral - Description - Normal. Axillary  General Axillary Region: Bilateral - Description - Normal. Tenderness - Non Tender. Femoral & Inguinal  Generalized Femoral & Inguinal Lymphatics: Bilateral - Description - No Generalized lymphadenopathy.    Assessment & Plan MALIGNANT NEOPLASM OF UPPER-OUTER QUADRANT OF RIGHT BREAST IN FEMALE, ESTROGEN RECEPTOR POSITIVE (C50.411) Impression: Pt has a new diagnosis of cT1cN0 right breast cancer. She appears to be a good candidate for a lumpectomy with sentinel lymph node biopsy. However, her cancer is faintly her 2 positive.  She will need genetic testing. She will get port for chemo as well.  She will also get an MRI due to age, family history, and her 2 positivity.  As long as we get no surprises, will plan seed loc lumpectomy wtih sentinel lymph node biopsy.  The surgical procedure was described to the patient. I discussed the incision type and location and that we would need radiology involved on with a wire or seed marker and/or sentinel node.  The risks and benefits of the procedure were described to the patient and she wishes to proceed.  We discussed the risks bleeding, infection, damage to other structures, need for further procedures/surgeries. We discussed the risk of seroma. The patient was advised if the area in the breast in cancer, we may need to go back to surgery for additional tissue to obtain negative margins or for a lymph node biopsy. The patient was advised that these are the most common complications, but that others can occur as well. They  were advised against taking aspirin or other anti-inflammatory agents/blood thinners the week before surgery. Current Plans Referred to Genetic Counseling, for evaluation and follow up (Medical Genetics). Routine. MR BREAST BILAT W CON ([id number]) Pt Education - [facility] Breast Cancer Information Given - [facility] "Breast Journey" Package   Signed by Stark Klein, MD

## 2018-02-23 NOTE — Progress Notes (Signed)
BRP w/asst per pt request-voided lg amt. C/o feeling nauseated with OOB. IVF infusing. Up in recliner. Will continue to monitor in phase 2.

## 2018-02-23 NOTE — Transfer of Care (Signed)
Immediate Anesthesia Transfer of Care Note  Patient: Kelly Rowe  Procedure(s) Performed: BREAST LUMPECTOMY WITH RADIOACTIVE SEED AND SENTINEL LYMPH NODE BIOPSY (Right Breast) INSERTION PORT-A-CATH (Left Chest)  Patient Location: PACU  Anesthesia Type:General and Regional  Level of Consciousness: awake and alert   Airway & Oxygen Therapy: Patient Spontanous Breathing and Patient connected to nasal cannula oxygen  Post-op Assessment: Report given to RN, Post -op Vital signs reviewed and stable and Patient moving all extremities X 4  Post vital signs: Reviewed and stable  Last Vitals:  Vitals Value Taken Time  BP 135/117 02/23/2018  3:23 PM  Temp    Pulse 75 02/23/2018  3:23 PM  Resp 20 02/23/2018  3:23 PM  SpO2 96 % 02/23/2018  3:23 PM  Vitals shown include unvalidated device data.  Last Pain:  Vitals:   02/23/18 1027  TempSrc:   PainSc: 0-No pain         Complications: No apparent anesthesia complications

## 2018-02-23 NOTE — Anesthesia Procedure Notes (Signed)
Procedure Name: MAC Date/Time: 02/23/2018 12:49 PM Performed by: Glynda Jaeger, CRNA Pre-anesthesia Checklist: Patient identified, Emergency Drugs available, Suction available, Timeout performed and Patient being monitored Patient Re-evaluated:Patient Re-evaluated prior to induction Oxygen Delivery Method: Nasal cannula Preoxygenation: Pre-oxygenation with 100% oxygen Induction Type: IV induction Ventilation: Mask ventilation without difficulty LMA: LMA inserted LMA Size: 4.0 Number of attempts: 1 Placement Confirmation: positive ETCO2 Tube secured with: Tape Dental Injury: Teeth and Oropharynx as per pre-operative assessment

## 2018-02-23 NOTE — Discharge Instructions (Addendum)
[facility] °Office Phone Number [phone] ° °BREAST BIOPSY/ PARTIAL MASTECTOMY: POST OP INSTRUCTIONS ° °Always review your discharge instruction sheet given to you by the facility where your surgery was performed. ° °IF YOU HAVE DISABILITY OR FAMILY LEAVE FORMS, YOU MUST BRING THEM TO THE OFFICE FOR PROCESSING.  DO NOT GIVE THEM TO YOUR DOCTOR. ° °1. A prescription for pain medication may be given to you upon discharge.  Take your pain medication as prescribed, if needed.  If narcotic pain medicine is not needed, then you may take acetaminophen (Tylenol) or ibuprofen (Advil) as needed. °2. Take your usually prescribed medications unless otherwise directed °3. If you need a refill on your pain medication, please contact your pharmacy.  They will contact our office to request authorization.  Prescriptions will not be filled after 5pm or on week-ends. °4. You should eat very light the first 24 hours after surgery, such as soup, crackers, pudding, etc.  Resume your normal diet the day after surgery. °5. Most patients will experience some swelling and bruising in the breast.  Ice packs and a good support bra will help.  Swelling and bruising can take several days to resolve.  °6. It is common to experience some constipation if taking pain medication after surgery.  Increasing fluid intake and taking a stool softener will usually help or prevent this problem from occurring.  A mild laxative (Milk of Magnesia or Miralax) should be taken according to package directions if there are no bowel movements after 48 hours. °7. Unless discharge instructions indicate otherwise, you may remove your bandages 48 hours after surgery, and you may shower at that time.  You may have steri-strips (small skin tapes) in place directly over the incision.  These strips should be left on the skin for 7-10 days.   Any sutures or staples will be removed at the office during your follow-up visit. °8. ACTIVITIES:  You may resume  regular daily activities (gradually increasing) beginning the next day.  Wearing a good support bra or sports bra (or the breast binder) minimizes pain and swelling.  You may have sexual intercourse when it is comfortable. °a. You may drive when you no longer are taking prescription pain medication, you can comfortably wear a seatbelt, and you can safely maneuver your car and apply brakes. °b. RETURN TO WORK:  __________1 week_______________ °9. You should see your doctor in the office for a follow-up appointment approximately two weeks after your surgery.  Your doctor’s nurse will typically make your follow-up appointment when she calls you with your pathology report.  Expect your pathology report 2-3 business days after your surgery.  You may call to check if you do not hear from us after three days. ° ° °WHEN TO CALL YOUR DOCTOR: °1. Fever over 101.0 °2. Nausea and/or vomiting. °3. Extreme swelling or bruising. °4. Continued bleeding from incision. °5. Increased pain, redness, or drainage from the incision. ° °The clinic staff is available to answer your questions during regular business hours.  Please don’t hesitate to call and ask to speak to one of the nurses for clinical concerns.  If you have a medical emergency, go to the nearest emergency room or call 911.  A surgeon from [facility] is always on call at the hospital. ° °For further questions, please visit [email or web address]  ° ° °Post Anesthesia Home Care Instructions ° °Activity: °Get plenty of rest for the remainder of the day. A responsible individual must stay with you for 24   hours following the procedure.  °For the next 24 hours, DO NOT: °-Drive a car °-Operate machinery °-Drink alcoholic beverages °-Take any medication unless instructed by your physician °-Make any legal decisions or sign important papers. ° °Meals: °Start with liquid foods such as gelatin or soup. Progress to regular foods as tolerated. Avoid greasy, spicy,  heavy foods. If nausea and/or vomiting occur, drink only clear liquids until the nausea and/or vomiting subsides. Call your physician if vomiting continues. ° °Special Instructions/Symptoms: °Your throat may feel dry or sore from the anesthesia or the breathing tube placed in your throat during surgery. If this causes discomfort, gargle with warm salt water. The discomfort should disappear within 24 hours. ° °If you had a scopolamine patch placed behind your ear for the management of post- operative nausea and/or vomiting: ° °1. The medication in the patch is effective for 72 hours, after which it should be removed.  Wrap patch in a tissue and discard in the trash. Wash hands thoroughly with soap and water. °2. You may remove the patch earlier than 72 hours if you experience unpleasant side effects which may include dry mouth, dizziness or visual disturbances. °3. Avoid touching the patch. Wash your hands with soap and water after contact with the patch. °  ° °

## 2018-02-23 NOTE — Anesthesia Postprocedure Evaluation (Signed)
Anesthesia Post Note  Patient: Kelly Rowe  Procedure(s) Performed: BREAST LUMPECTOMY WITH RADIOACTIVE SEED AND SENTINEL LYMPH NODE BIOPSY (Right Breast) INSERTION PORT-A-CATH (Left Chest)     Patient location during evaluation: PACU Anesthesia Type: General Level of consciousness: sedated Pain management: pain level controlled Vital Signs Assessment: post-procedure vital signs reviewed and stable Respiratory status: spontaneous breathing Cardiovascular status: stable Postop Assessment: no apparent nausea or vomiting Anesthetic complications: no    Last Vitals:  Vitals:   02/23/18 1543 02/23/18 1555  BP:  114/77  Pulse: 73 69  Resp: 19 19  Temp:    SpO2: 92% 96%    Last Pain:  Vitals:   02/23/18 1543  TempSrc:   PainSc: Asleep   Pain Goal:                 [name] JR,JOHN Neysa Arts

## 2018-02-23 NOTE — Progress Notes (Signed)
CXR report called to Dr Stacy Gardner new orders.

## 2018-02-23 NOTE — Anesthesia Procedure Notes (Signed)
Anesthesia Regional Block: Pectoralis block   Pre-Anesthetic Checklist: ,, timeout performed, Correct Patient, Correct Site, Correct Laterality, Correct Procedure, Correct Position, site marked, Risks and benefits discussed,  Surgical consent,  Pre-op evaluation,  At surgeon's request and post-op pain management  Laterality: Right and Upper  Prep: chloraprep       Needles:  Injection technique: Single-shot  Needle Type: Echogenic Stimulator Needle     Needle Length: 10cm  Needle Gauge: 21   Needle insertion depth: 4.9 cm   Additional Needles:   Procedures:,,,, ultrasound used (permanent image in chart),,,,  Narrative:  Start time: 02/23/2018 11:45 AM End time: 02/23/2018 11:58 AM Injection made incrementally with aspirations every 5 mL. Anesthesiologist: Lyn Hollingshead, MD

## 2018-02-23 NOTE — Interval H&P Note (Signed)
History and Physical Interval Note:  02/23/2018 10:53 AM  Kelly Rowe  has presented today for surgery, with the diagnosis of RIGHT BREAST CANCER  The various methods of treatment have been discussed with the patient and family. After consideration of risks, benefits and other options for treatment, the patient has consented to  Procedure(s): BREAST LUMPECTOMY WITH RADIOACTIVE SEED AND SENTINEL LYMPH NODE BIOPSY (Right) INSERTION PORT-A-CATH (N/A) as a surgical intervention .  The patient's history has been reviewed, patient examined, no change in status, stable for surgery.  I have reviewed the patient's chart and labs.  Questions were answered to the patient's satisfaction.     Stark Klein

## 2018-02-23 NOTE — Op Note (Signed)
Left subclavian port a cath, Right Breast Radioactive seed localized lumpectomy and sentinel lymph node mapping and biopsy  Indications: This patient presents with history of right breast cancer, grade 3, upper outer quadrant, cT1cN0, triple positive  Pre-operative Diagnosis: right breast cancer  Post-operative Diagnosis: Same  Surgeon: Stark Klein   Anesthesia: General endotracheal anesthesia  ASA Class: 3  Procedure Details  The patient was seen in the Holding Room. The risks, benefits, complications, treatment options, and expected outcomes were discussed with the patient. The possibilities of bleeding, infection, the need for additional procedures, failure to diagnose a condition, and creating a complication requiring transfusion or operation were discussed with the patient. The patient concurred with the proposed plan, giving informed consent.  The site of surgery properly noted/marked. The patient was taken to Operating Room # 2, identified, and the procedure verified as port placement, right seed localized Lumpectomy and sentinel lymph node biopsy. The patient's bilateral breast and chest, neck, and right arm were prepped and draped in sterile fashion.  A Time Out was held and the above information confirmed.  Methylene blue was administered in the subareolar location of the right breast.    The port was placed on the left.  Local anesthetic was administered over this   area at the angle of the clavicle.  The vein was accessed with 2 pass(es) of the needle. There was good venous return and the wire passed easily with no ectopy.   Fluoroscopy was used to confirm that the wire was in the vena cava.      The patient was placed back level and the area for the pocket was anethetized   with local anesthetic.  A 3-cm transverse incision was made with a #15   blade.  Cautery was used to divide the subcutaneous tissues down to the   pectoralis muscle.  An [employer] retractor was used to  elevate the skin   while a pocket was created on top of the pectoralis fascia.  The port   was placed into the pocket to confirm that it was of adequate size.  The   catheter was preattached to the port.  The port was then secured to the   pectoralis fascia with four 2-0 Prolene sutures.  These were clamped and   not tied down yet.    The catheter was tunneled through to the wire exit site.  The catheter was placed along the wire to determine what length it should be to be in the SVC.  The catheter was cut at 22 cm.  The tunneler sheath and dilator were passed over the wire and the dilator and wire were removed.  The catheter was advanced through the tunneler sheath and the tunneler sheath was pulled away.  Care was taken to keep the catheter in the tunneler sheath as this occurred. This was advanced and the tunneler sheath was removed.  There was good venous   return and easy flush of the catheter.  The Prolene sutures were tied   down to the pectoral fascia.  The skin was reapproximated using 3-0   Vicryl interrupted deep dermal sutures.    Fluoroscopy was used to re-confirm good position of the catheter.  The skin   was then closed using 4-0 Monocryl in a subcuticular fashion.  The port was flushed with concentrated heparin flush as well.  The wounds were then cleaned, dried, and dressed with Dermabond.   The lumpectomy was performed by creating an transverse incision over the lateral  right breast over the previously placed radioactive seed.  Dissection was carried down to around the point of maximum signal intensity. The cautery was used to perform the dissection.  Hemostasis was achieved with cautery. The edges of the cavity were marked with large clips, with one each medial, lateral, inferior and superior, and two clips posteriorly.   The specimen was inked with the margin marker paint kit.    Specimen radiography confirmed inclusion of the mammographic lesion, the clip, and the seed.  The  background signal in the breast was zero.  The wound was irrigated and closed with 3-0 vicryl in layers and 4-0 monocryl subcuticular suture.    Using a hand-held gamma probe, right axillary sentinel nodes were identified transcutaneously.  An oblique incision was created below the axillary hairline.  Dissection was carried through the clavipectoral fascia.  Two deep level two axillary sentinel nodes were removed.  Counts per second were 390 and 150.    The background count was 13 cps.  The wound was irrigated.  Hemostasis was achieved with cautery.  The axillary incision was closed with a 3-0 vicryl deep dermal interrupted sutures and a 4-0 monocryl subcuticular closure.    Sterile dressings were applied. At the end of the operation, all sponge, instrument, and needle counts were correct.  Findings: grossly clear surgical margins, anterior margin is skin.  Sentinel lymph node #2 was very large and firm.    Estimated Blood Loss:  50 mL         Specimens: right breast lumpectomy with seed and two right axillary sentinel lymph nodes.             Complications:  None; patient tolerated the procedure well.         Disposition: PACU - hemodynamically stable.         Condition: stable

## 2018-02-24 ENCOUNTER — Encounter (HOSPITAL_COMMUNITY): Payer: Self-pay | Admitting: General Surgery

## 2018-02-24 NOTE — Addendum Note (Signed)
Addendum  created 02/24/18 0929 by Glynda Jaeger, CRNA   Charge Capture section accepted

## 2018-02-27 ENCOUNTER — Other Ambulatory Visit: Payer: Self-pay | Admitting: Oncology

## 2018-02-28 ENCOUNTER — Telehealth: Payer: Self-pay | Admitting: General Surgery

## 2018-02-28 NOTE — Telephone Encounter (Signed)
Left message on machine that we are done with surgery and that we are continuing on the treatment plan.  Margins are negative and 1/2 Ln positive.  She is triple positive and will therefore be doing chemotherapy.  She will also have radiation.

## 2018-03-01 NOTE — Progress Notes (Signed)
[place]  [phone] [phone]     ID: STARLEEN TRUSSELL DOB: [birth year]  MR#: [id number]  QMV#:[id number]  Patient Care Team: Biagio Borg, MD as PCP - [name], MD as Consulting Physician (General Surgery) [name], [name], MD as Consulting Physician (Oncology) [name], MD as Attending Physician (Radiation Oncology) [name], NP as Nurse Practitioner (Obstetrics and Gynecology) Marlinda Mike, PA-C as Referring Physician (Physician Assistant) OTHER MD:   CHIEF COMPLAINT: Triple positive breast cancer  CURRENT TREATMENT: Awaiting definitive surgery   HISTORY OF CURRENT ILLNESS: From the original intake note:  "Breshay" noticed bruising in her lateral right breast and palpated a mass  on 12/23/2017. She followed up with her gynecologist. She underwent unilateral right diagnostic mammography with tomography and right breast ultrasonography at [facility] on 01/05/2018 showing: breast density category B. There is an irregular highly suspicious mass within the right breast at the 9:30 o'clock upper outer quadrant, measuring 1.8 x 1.1 x 1.5 cm, and located 18 cm from the nipple,  Ultrasonography revealed a single morphologically abnormal lymph node in the RIGHT axilla, with cortical thickness of 6 mm.   Accordingly on 01/11/2018 she proceeded to biopsy of the right breast area in question as well as a suspicious lymph node. The pathology from this procedure showed ([id number]): Invasive ductal carcinoma grade III.  The lymph node biopsied was negative for carcinoma (concordant).. Prognostic indicators significant for: estrogen receptor, 60% positive with weak staining intensity and progesterone receptor, 10% positive with strong staining intensity. Proliferation marker Ki67 at 30%. HER2 amplified with ratios HER2/CEP17 signals 2.26 and average HER2 copies per cell 3.50  The patient's subsequent history is as detailed  below.  INTERVAL HISTORY: Myana returns today for follow up and treatment of her triple positive breast cancer accompanied by her mother. She was having some drainage from the breast since 9pm last night. She saw Dr. Barry Dienes this morning. She felt anxious and nauseous after her lumpectomy, and she it made her cry she said  Since her last visit, she underwent right breast lumpectomy with sentinel lymph node sampling on 02/23/2018 with pathology showing: Invasive ductal carcinoma grade III, spanning 2.4 cm. Carcinoma was focally 0.1 cm to the medial margin. Out of 2 right axillary lymph nodes, 1 was positive for metastatic carcinoma. One right axillary SNL was benign.   She tolerated the surgery well, except that she had a little bit of serum seep out from the incision.  She brought this to her surgeon's attention and was reassured.  This has not recurred.   REVIEW OF SYSTEMS: She denies unusual headaches, visual changes, nausea, vomiting, or dizziness. There has been no unusual cough, phlegm production, or pleurisy. This been no change in bowel or bladder habits. She denies unexplained fatigue or unexplained weight loss, bleeding, rash, or fever. A detailed review of systems was otherwise stable.     PAST MEDICAL HISTORY: Past Medical History:  Diagnosis Date  . ANEMIA-IRON DEFICIENCY 01/30/2010  . ANXIETY 11/27/2007  . Arthritis    both knees  . Asthma 02/25/2011  . Carbuncle and furuncle of trunk 04/16/2010  . Chlamydia infection 03/21/2008  . DIABETES MELLITUS, TYPE II 08/02/2007  . Edema 01/30/2010  . ELEVATED BLOOD PRESSURE WITHOUT DIAGNOSIS OF HYPERTENSION 08/02/2007  . Family history of breast cancer   . Family history of colon cancer   . Family history of lung cancer   . FREQUENCY, URINARY 05/19/2009  . GENITAL HERPES 03/12/2009  .  HIV (human immunodeficiency virus infection) ([place]) 2009  . HIV INFECTION 03/12/2009  . HSV (herpes simplex virus) infection   . HYPERLIPIDEMIA 08/02/2007  .  Hypertension   . Metrorrhagia 03/21/2008  . PONV (postoperative nausea and vomiting)   . RETENTION, URINE 05/19/2009  . Trichomonas infection 01/19/2010  . VITAMIN D DEFICIENCY 01/30/2010    PAST SURGICAL HISTORY: Past Surgical History:  Procedure Laterality Date  . ABDOMINAL HYSTERECTOMY  07/22/2010   TAH WITH PRESERVATION OF BOTH TUBES AND OVARIES  . BREAST LUMPECTOMY WITH RADIOACTIVE SEED AND SENTINEL LYMPH NODE BIOPSY Right 02/23/2018   Procedure: BREAST LUMPECTOMY WITH RADIOACTIVE SEED AND SENTINEL LYMPH NODE BIOPSY;  Surgeon: Stark Klein, MD;  Location: [facility];  Service: General;  Laterality: Right;  . CHOLECYSTECTOMY    . ENDOMETRIAL ABLATION  01/11/2008   HER OPTION  . ESOPHAGOGASTRODUODENOSCOPY    . MULTIPLE TOOTH EXTRACTIONS    . PORTACATH PLACEMENT Left 02/23/2018   Procedure: INSERTION PORT-A-CATH;  Surgeon: Stark Klein, MD;  Location: [facility];  Service: General;  Laterality: Left;  . TUBAL LIGATION    . WISDOM TOOTH EXTRACTION      FAMILY HISTORY Family History  Problem Relation Age of Onset  . Prostate cancer Other   . Heart disease Other   . Stroke Other   . Diabetes Mother   . Hypertension Mother   . Diabetes Father   . Cancer Father        COLON and LU NG  . Stroke Paternal Uncle   . Breast cancer Paternal Aunt   . Lung cancer Maternal Grandmother 14       Mesothelioma  . Colon cancer Paternal Grandfather        dx over 54s  . Lung cancer Paternal Aunt   . Breast cancer Paternal Aunt   . Breast cancer Paternal Aunt   . Heart attack Maternal Grandfather 71  . Colon cancer Other        MGM's 5 brothers  The patient's father died at age 62 due to metastatic liver cancer. The patient's mother is alive at 66. The patient's has 1 brother and 3 sisters. There was a paternal grandfather with colon cancer. There were 3 paternal aunts with breast cancer, 2 diagnosed in the 67's and 1 at age 68. There was a maternal grandmother with mesothelioma. The patient otherwise  denies a history of ovarian cancer in the family.    GYNECOLOGIC HISTORY:  Patient's last menstrual period was 06/21/2010. Menarche: 47 years old Age at first live birth: 47 years old She is GXP5. Her LMP was December 2011. She is status post partial hysterectomy without oophorectomy. She took oral contraception for 3 years with no complications. She never took HRT.    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  At home are 2 of her sons, Glendell Docker and [name], and the patient's grandson, Amador Cunas 14 (who is Willie's son). The patient's oldest is [place] age 83 who lives in [place] as a cook. Daughter, [place] age 36 works as a Scientist, water quality. Son, Glendell Docker age 54 is disabled. Son, [name] age 55 lives in [place] as a Microbiologist. Daughter, Benard Rink age 80 also is a Microbiologist. The patient has 5 grandchildren and no great grandchildren. The patient does not belong to a church.       ADVANCED DIRECTIVES: Not in place; at the 01/18/2018 visit the patient was given the appropriate documents to complete on notarized at her discretion   HEALTH MAINTENANCE: Social History  Tobacco Use  . Smoking status: Never Smoker  . Smokeless tobacco: Never Used  Substance Use Topics  . Alcohol use: Yes    Alcohol/week: 0.0 standard drinks    Comment: OCCASIONALLY  . Drug use: No     Colonoscopy: Not yet  PAP: November 2018  Bone density: Never   Allergies  Allergen Reactions  . Levaquin [Levofloxacin In D5w] Nausea And Vomiting and Rash  . Penicillins Swelling and Rash    Has patient had a PCN reaction causing immediate rash, facial/tongue/throat swelling, SOB or lightheadedness with hypotension: Yes Has patient had a PCN reaction causing severe rash involving mucus membranes or skin necrosis: No Has patient had a PCN reaction that required hospitalization: Yes Has patient had a PCN reaction occurring within the last 10 years: No If all of the above answers are "NO", then may proceed with Cephalosporin  use.    Current Outpatient Medications  Medication Sig Dispense Refill  . albuterol (PROVENTIL HFA;VENTOLIN HFA) 108 (90 Base) MCG/ACT inhaler Inhale 2 puffs into the lungs every 6 (six) hours as needed for wheezing or shortness of breath.    Marland Kitchen aspirin 81 MG tablet Take 81 mg by mouth daily as needed (chest pain).     . Azelastine-Fluticasone (DYMISTA) 137-50 MCG/ACT SUSP Use as directed 1 spray each side twice per day as needed (Patient taking differently: Place 1 spray into both nostrils 2 (two) times daily as needed (alleriges). ) 23 g 5  . Beclomethasone Dipropionate (QNASL) 80 MCG/ACT AERS Place 2 sprays into the nose daily. (Patient taking differently: Place 2 sprays into the nose daily as needed (congestion). ) 1 Inhaler 1  . blood glucose meter kit and supplies Dispense based on patient and insurance preference. Use up to four times daily as directed. (FOR ICD-10 E10.9, E11.9). 1 each 0  . Cholecalciferol (VITAMIN D) 2000 units tablet Take 2,000 Units by mouth daily.    . Diclofenac Sodium (PENNSAID) 2 % SOLN Place 1 application onto the skin 2 (two) times daily. (Patient taking differently: Place 1 application onto the skin 2 (two) times daily as needed (pain). ) 1 Bottle 3  . diphenhydrAMINE HCl (ZZZQUIL) 50 MG/30ML LIQD Take 50 mg by mouth at bedtime as needed (sleep).    . Dulaglutide (TRULICITY) 7.10 GY/6.9SW SOPN Inject 0.75 mg into the skin once a week. 6 mL 11  . elvitegravir-cobicistat-emtricitabine-tenofovir (STRIBILD) 150-150-200-300 MG TABS tablet Take 1 tablet daily by mouth.    . ferrous gluconate (IRON 27) 240 (27 FE) MG tablet Take 480 mg by mouth daily.    Marland Kitchen glipiZIDE (GLIPIZIDE XL) 5 MG 24 hr tablet Take 1 tablet (5 mg total) by mouth daily with breakfast. 90 tablet 3  . ibuprofen (ADVIL,MOTRIN) 800 MG tablet Take 1 tablet (800 mg total) by mouth every 8 (eight) hours as needed. (Patient taking differently: Take 800 mg by mouth daily as needed for moderate pain. ) 60 tablet  0  . metFORMIN (GLUCOPHAGE-XR) 500 MG 24 hr tablet Take 2 tablets (1,000 mg total) by mouth daily with breakfast. Annual appt is due must see provider for future refills 180 tablet 3  . Multiple Vitamin (MULTIVITAMIN WITH MINERALS) TABS tablet Take 1 tablet by mouth daily.    Marland Kitchen oxyCODONE (OXY IR/ROXICODONE) 5 MG immediate release tablet Take 1-2 tablets (5-10 mg total) by mouth every 6 (six) hours as needed for moderate pain, severe pain or breakthrough pain. 30 tablet 0  . polyethylene glycol (MIRALAX / GLYCOLAX) packet Take  17 g by mouth daily as needed for moderate constipation.    . rosuvastatin (CRESTOR) 40 MG tablet Take 40 mg by mouth every evening.    . traMADol (ULTRAM) 50 MG tablet Take 50 mg by mouth daily as needed for moderate pain.    . valACYclovir (VALTREX) 500 MG tablet Take twice daily for 3-5 days (Patient taking differently: Take 500 mg by mouth 2 (two) times daily as needed (fever blisters). Take for 3-5 days) 30 tablet 12  . zolpidem (AMBIEN) 10 MG tablet Take 10 mg by mouth at bedtime as needed for sleep.     No current facility-administered medications for this visit.     OBJECTIVE: Morbidly obese African-American woman in no acute distress Vitals:   03/03/18 1202  BP: (!) 158/115  Pulse: 91  Resp: 18  Temp: 99 F (37.2 C)  SpO2: 100%     Body mass index is 48.07 kg/m.   Wt Readings from Last 3 Encounters:  03/03/18 262 lb 12.8 oz (119.2 kg)  02/23/18 259 lb (117.5 kg)  02/17/18 259 lb 14.4 oz (117.9 kg)      ECOG FS:0 - Asymptomatic  Sclerae unicteric, pupils round and equal Oropharynx clear and moist No cervical or supraclavicular adenopathy Lungs no rales or rhonchi Heart regular rate and rhythm Abd soft, nontender, positive bowel sounds MSK no focal spinal tenderness, no upper extremity lymphedema Neuro: nonfocal, well oriented, appropriate affect Breasts: The right breast is status post recent lumpectomy.  The incision is healing nicely.  There is  currently no discharge or expressible fluid from the incision.  There is no erythema or swelling.  Both axillae are benign.  LAB RESULTS:  CMP     Component Value Date/Time   NA 139 02/15/2018 1306   K 3.5 02/15/2018 1306   CL 105 02/15/2018 1306   CO2 24 02/15/2018 1306   GLUCOSE 138 (H) 02/15/2018 1306   BUN 7 02/15/2018 1306   CREATININE 0.66 02/15/2018 1306   CREATININE 0.80 01/18/2018 1227   CALCIUM 9.2 02/15/2018 1306   PROT 8.0 01/18/2018 1227   ALBUMIN 3.8 01/18/2018 1227   AST 18 01/18/2018 1227   ALT 18 01/18/2018 1227   ALKPHOS 144 (H) 01/18/2018 1227   BILITOT 0.3 01/18/2018 1227   GFRNONAA >60 02/15/2018 1306   GFRNONAA >60 01/18/2018 1227   GFRAA >60 02/15/2018 1306   GFRAA >60 01/18/2018 1227    No results found for: TOTALPROTELP, ALBUMINELP, A1GS, A2GS, BETS, BETA2SER, GAMS, MSPIKE, SPEI  No results found for: KPAFRELGTCHN, LAMBDASER, [facility]  Lab Results  Component Value Date   WBC 5.1 02/15/2018   NEUTROABS 2.4 01/18/2018   HGB 13.3 02/15/2018   HCT 42.2 02/15/2018   MCV 87.0 02/15/2018   PLT 292 02/15/2018    @LASTCHEMISTRY @  No results found for: LABCA2  No components found for: [id number]  No results for input(s): INR in the last 168 hours.  No results found for: LABCA2  No results found for: EPP295  No results found for: JOA416  No results found for: SAY301  No results found for: CA2729  No components found for: HGQUANT  No results found for: CEA1 / No results found for: CEA1   No results found for: AFPTUMOR  No results found for: CHROMOGRNA  No results found for: PSA1  No visits with results within 3 Day(s) from this visit.  Latest known visit with results is:  Admission on 02/23/2018, Discharged on 02/23/2018  Component Date Value Ref Range  Status  . Glucose-Capillary 02/23/2018 124* 70 - 99 mg/dL Final  . Comment 1 02/23/2018 Notify RN   Final  . Comment 2 02/23/2018 Document in Chart   Final  .  Glucose-Capillary 02/23/2018 120* 70 - 99 mg/dL Final  . Glucose-Capillary 02/23/2018 152* 70 - 99 mg/dL Final    (this displays the last labs from the last 3 days)  No results found for: TOTALPROTELP, ALBUMINELP, A1GS, A2GS, BETS, BETA2SER, GAMS, MSPIKE, SPEI (this displays SPEP labs)  No results found for: KPAFRELGTCHN, LAMBDASER, KAPLAMBRATIO (kappa/lambda light chains)  No results found for: HGBA, HGBA2QUANT, HGBFQUANT, HGBSQUAN (Hemoglobinopathy evaluation)   No results found for: LDH  Lab Results  Component Value Date   IRON 35 (L) 01/30/2010   IRONPCTSAT 8.8 (L) 01/30/2010   (Iron and TIBC)  No results found for: FERRITIN  Urinalysis    Component Value Date/Time   COLORURINE DARK YELLOW 01/30/2018 1534   APPEARANCEUR CLOUDY (A) 01/30/2018 1534   LABSPEC 1.025 01/30/2018 1534   PHURINE 5.5 01/30/2018 1534   GLUCOSEU NEGATIVE 01/30/2018 1534   GLUCOSEU NEGATIVE 09/27/2017 1002   HGBUR 1+ (A) 01/30/2018 1534   BILIRUBINUR NEGATIVE 09/27/2017 1002   KETONESUR TRACE (A) 01/30/2018 1534   PROTEINUR TRACE (A) 01/30/2018 1534   UROBILINOGEN 0.2 09/27/2017 1002   NITRITE NEGATIVE 09/27/2017 1002   LEUKOCYTESUR NEGATIVE 09/27/2017 1002     STUDIES: Ct Chest W Contrast  Result Date: 02/06/2018 CLINICAL DATA:  Right breast cancer. EXAM: CT CHEST WITH CONTRAST TECHNIQUE: Multidetector CT imaging of the chest was performed during intravenous contrast administration. CONTRAST:  18m OMNIPAQUE IOHEXOL 300 MG/ML  SOLN COMPARISON:  Bone scan 02/06/2018. FINDINGS: Cardiovascular: Vascular structures are unremarkable. Heart is mildly enlarged. No pericardial effusion. Mediastinum/Nodes: Mediastinal lymph nodes are not enlarged by CT size criteria. No hilar adenopathy. Right axillary lymph nodes measure up to 2.1 x 3.0 cm. Partially imaged left axillary lymph nodes measure up to 10 mm. Esophagus is grossly unremarkable. Lungs/Pleura: Lungs are clear. No pleural fluid. Airway is  unremarkable. Upper Abdomen: Visualized portions of the liver, adrenal glands, left kidney, spleen, pancreas and stomach are grossly unremarkable. Musculoskeletal: No worrisome lytic or sclerotic lesions. There is no CT correlate in the sternum to correspond to the questioned abnormality on today's bone scan. IMPRESSION: 1. Right axillary adenopathy, indicative of metastatic disease. 2. Borderline enlarged left axillary lymph nodes, nonspecific. 3. No additional evidence of metastatic disease. Electronically Signed   By: MLorin PicketM.D.   On: 02/06/2018 18:09   Nm Bone Scan Whole Body  Result Date: 02/06/2018 CLINICAL DATA:  Breast cancer, staging EXAM: NUCLEAR MEDICINE WHOLE BODY BONE SCAN TECHNIQUE: Whole body anterior and posterior images were obtained approximately 3 hours after intravenous injection of radiopharmaceutical. RADIOPHARMACEUTICALS:  20.8 mCi Technetium-930mDP IV COMPARISON:  None Radiographic correlation: None recent FINDINGS: Increased tracer accumulation at the shoulders, knees and feet, typically degenerative. Abnormal increased tracer localization at the LEFT lateral aspect of the distal sternum, nonspecific, appearance atypical for metastasis though not excluded. Uptake bilaterally at the lower lumbar spine typically degenerative. No additional sites of abnormal osseous tracer accumulation are identified. Expected urinary tract and soft tissue distribution of tracer. IMPRESSION: Scattered degenerative type uptake with mild increased tracer localization at the LEFT lateral aspect of the distal sternum, slightly linear in appearance and nonspecific, appearance atypical for metastatic disease though not completely excluded. Electronically Signed   By: MaLavonia Dana.D.   On: 02/06/2018 17:02   Nm Sentinel Node Inj-no  Rpt (breast)  Result Date: 02/23/2018 Sulfur colloid was injected by the nuclear medicine technologist for melanoma sentinel node.   Mm Breast Surgical  Specimen  Result Date: 02/23/2018 CLINICAL DATA:  Evaluate specimen EXAM: SPECIMEN RADIOGRAPH OF THE RIGHT BREAST COMPARISON:  Previous exam(s). FINDINGS: Status post excision of the right breast. The radioactive seed and biopsy marker clip are present, completely intact, and were marked for pathology. IMPRESSION: Specimen radiograph of the right breast. Electronically Signed   By: Dorise Bullion III M.D   On: 02/23/2018 13:57   Dg Chest Port 1 View  Result Date: 02/23/2018 CLINICAL DATA:  Port-A-Cath placement. EXAM: PORTABLE CHEST 1 VIEW COMPARISON:  CT chest 02/06/2018 FINDINGS: Power port inserted from the left subclavian vein with its tip in the SVC at the azygos level. The catheter either takes a tight loop or is kinked just lateral to the second rib. No pneumothorax. Cardiomegaly. Venous hypertension/fluid overload. IMPRESSION: Power port in place with the tip in the SVC at the azygos level. No pneumothorax. The catheter either takes a tight loop or a kink just lateral to the second rib. Electronically Signed   By: Nelson Chimes M.D.   On: 02/23/2018 16:00   Dg Fluoro Guide Cv Line-no Report  Result Date: 02/23/2018 Fluoroscopy was utilized by the requesting physician.  No radiographic interpretation.   Mm Rt Radioactive Seed Loc Mammo Guide  Result Date: 02/22/2018 CLINICAL DATA:  Patient presents for seed localization prior to RIGHT lumpectomy for grade 3 invasive ductal carcinoma. EXAM: MAMMOGRAPHIC GUIDED RADIOACTIVE SEED LOCALIZATION OF THE RIGHT BREAST COMPARISON:  Previous exam(s). FINDINGS: Patient presents for radioactive seed localization prior to lumpectomy. I met with the patient and we discussed the procedure of seed localization including benefits and alternatives. We discussed the high likelihood of a successful procedure. We discussed the risks of the procedure including infection, bleeding, tissue injury and further surgery. We discussed the low dose of radioactivity involved in  the procedure. Informed, written consent was given. The usual time-out protocol was performed immediately prior to the procedure. Using mammographic guidance, sterile technique, 1% lidocaine and an I-125 radioactive seed, the ribbon shaped clip within the mass in the UPPER-OUTER QUADRANT of the RIGHT breast was localized using a superior to inferior approach. The follow-up mammogram images confirm the seed in the expected location and were marked for Dr. Barry Dienes. Follow-up survey of the patient confirms presence of the radioactive seed. Order number of I-125 seed:  [id number]. Total activity:  7.591 millicurie.  Reference Date: 02/10/2018 The patient tolerated the procedure well and was released from the [facility]. She was given instructions regarding seed removal. IMPRESSION: Radioactive seed localization right breast. No apparent complications. Electronically Signed   By: Nolon Nations M.D.   On: 02/22/2018 14:51    ELIGIBLE FOR AVAILABLE RESEARCH PROTOCOL: no  ASSESSMENT: 47 [place], [name] woman status post right breast upper outer quadrant biopsy 01/11/2018 for a clinical T1 N0, stage IA invasive ductal carcinoma, grade 3, estrogen and progesterone receptor positive, HER-2 amplified, with an MIB-1 of 30%.  (1) status post right lumpectomy and sentinel lymph node sampling 02/23/2018 for a pT2 pN1, stage IB invasive ductal carcinoma, grade 3, with close but negative margins  (2) adjuvant chemotherapy consisting of carboplatin, docetaxel, trastuzumab and Pertuzumab every 21 days x 6 to start 03/08/2018  (3) anti-HER-2 immunotherapy to start concurrently with chemotherapy  (a) baseline echocardiogram on 02/07/2018 showed an ejection fraction in the 55-60% range  (4) adjuvant radiation as appropriate  (  5) antiestrogens to follow at the completion of local treatment  (6) genetics testing through [facility] Multi-cancer and Breast panel on 01/13/2018 showed: no deleterious mutations. The  following genes were evaluated for sequence changes and exonic deletions/duplications: ALK, APC, ATM, AXIN2, BAP1, BARD1, BLM, BMPR1A, BRCA1, BRCA2, BRIP1, CASR, CDC73, CDH1, CDK4, CDKN1B, CDKN1C,CDKN2A (p14ARF), CDKN2A (p16INK4a), CEBPA, CHEK2, CTNNA1, DICER1, DIS3L2, EPCAM*, FH, FLCN, GATA2, GPC3, GREM1*, HRAS, KIT, MAX, MEN1, MET, MLH1, MSH2, MSH3, MSH6, MUTYH, NBN, NF1, NF2, PALB2, PDGFRA, PHOX2B*, PMS2, POLD1,POLE, POT1, PRKAR1A, PTCH1, PTEN, RAD50, RAD51C, RAD51D, RB1, RECQL4, RET, RUNX1, SDHAF2, SDHB, SDHC, SDHD,SMAD4, SMARCA4, SMARCB1, SMARCE1, STK11, SUFU, TERC, TERT, TMEM127, TP53, TSC1, TSC2, VHL, WRN*, WT1.The following genes were evaluated for sequence changes only: EGFR*, HOXB13*, MITF*, NTHL1*, SDHA  PLAN: Tieshia did well with her surgery.  She is now ready to start her systemic therapy and today we discussed in detail the agents she will receive as well as the possible toxicity side effects and complications.  She also met with our chemotherapy nurse to review those further.  She has been having some pain from the surgery and requested a refill on her oxycodone.  I wrote her for naproxen 500 mg to take 3 times daily as needed and we will see if that works for her.  If it does not we will try some tramadol.  I have encouraged her to be as active as possible.  All her chemotherapy orders have been entered as well as her supportive medications and today I gave her a "roadmap" on how to take the supportive medications beginning on the day before chemo.  She will see is generally with each treatment and also 7 to 10 days later to assess side effects.  She had brought her some papers for [employer] and does appear to be in process at this time  I have asked her to call us with any questions or concerns that she may have at any time.   [name], [name], MD  03/03/18 12:15 PM Medical Oncology and Hematology [facility] [address] [place], [place] [address] Tel.  [phone]    Fax. [phone]  Alice Rieger, am acting as scribe for Chauncey Cruel MD.  I, Lurline Del MD, have reviewed the above documentation for accuracy and completeness, and I agree with the above.

## 2018-03-01 NOTE — Progress Notes (Signed)
FMLA successfully faxed to [employer] at [phone] and [employer] at [phone]. Mailed copy to patient address on file.

## 2018-03-03 ENCOUNTER — Inpatient Hospital Stay: Payer: BC Managed Care – PPO

## 2018-03-03 ENCOUNTER — Inpatient Hospital Stay: Payer: BC Managed Care – PPO | Attending: Oncology | Admitting: Oncology

## 2018-03-03 VITALS — BP 158/115 | HR 91 | Temp 99.0°F | Resp 18 | Ht 62.0 in | Wt 262.8 lb

## 2018-03-03 DIAGNOSIS — Z5189 Encounter for other specified aftercare: Secondary | ICD-10-CM | POA: Diagnosis not present

## 2018-03-03 DIAGNOSIS — E119 Type 2 diabetes mellitus without complications: Secondary | ICD-10-CM | POA: Diagnosis not present

## 2018-03-03 DIAGNOSIS — F419 Anxiety disorder, unspecified: Secondary | ICD-10-CM | POA: Diagnosis not present

## 2018-03-03 DIAGNOSIS — Z5111 Encounter for antineoplastic chemotherapy: Secondary | ICD-10-CM | POA: Diagnosis not present

## 2018-03-03 DIAGNOSIS — Z21 Asymptomatic human immunodeficiency virus [HIV] infection status: Secondary | ICD-10-CM | POA: Diagnosis not present

## 2018-03-03 DIAGNOSIS — C773 Secondary and unspecified malignant neoplasm of axilla and upper limb lymph nodes: Secondary | ICD-10-CM | POA: Insufficient documentation

## 2018-03-03 DIAGNOSIS — Z5112 Encounter for antineoplastic immunotherapy: Secondary | ICD-10-CM | POA: Insufficient documentation

## 2018-03-03 DIAGNOSIS — C50411 Malignant neoplasm of upper-outer quadrant of right female breast: Secondary | ICD-10-CM | POA: Diagnosis not present

## 2018-03-03 DIAGNOSIS — R52 Pain, unspecified: Secondary | ICD-10-CM | POA: Insufficient documentation

## 2018-03-03 DIAGNOSIS — Z170 Estrogen receptor positive status [ER+]: Secondary | ICD-10-CM | POA: Diagnosis not present

## 2018-03-03 DIAGNOSIS — R197 Diarrhea, unspecified: Secondary | ICD-10-CM | POA: Diagnosis not present

## 2018-03-03 DIAGNOSIS — Z79899 Other long term (current) drug therapy: Secondary | ICD-10-CM | POA: Insufficient documentation

## 2018-03-03 DIAGNOSIS — R110 Nausea: Secondary | ICD-10-CM | POA: Insufficient documentation

## 2018-03-03 MED ORDER — LIDOCAINE-PRILOCAINE 2.5-2.5 % EX CREA: TOPICAL_CREAM | CUTANEOUS | 3 refills | Status: DC

## 2018-03-03 MED ORDER — LORAZEPAM 0.5 MG PO TABS: 0.5000 mg | ORAL_TABLET | Freq: Every evening | ORAL | 0 refills | Status: DC | PRN

## 2018-03-03 MED ORDER — PROCHLORPERAZINE MALEATE 10 MG PO TABS: 10.0000 mg | ORAL_TABLET | Freq: Four times a day (QID) | ORAL | 1 refills | Status: DC | PRN

## 2018-03-03 MED ORDER — DEXAMETHASONE 4 MG PO TABS: 8.0000 mg | ORAL_TABLET | Freq: Two times a day (BID) | ORAL | 1 refills | Status: DC

## 2018-03-03 NOTE — Progress Notes (Signed)
START ON PATHWAY REGIMEN - Breast     A cycle is every 21 days:     Docetaxel      Carboplatin      Trastuzumab-xxxx      Trastuzumab-xxxx      Pertuzumab      Pertuzumab   **Always confirm dose/schedule in your pharmacy ordering system**  Patient Characteristics: Postoperative without Neoadjuvant Therapy (Pathologic Staging), Invasive Disease, Adjuvant Therapy, HER2 Positive, ER Positive, Node Positive Therapeutic Status: Postoperative without Neoadjuvant Therapy (Pathologic Staging) AJCC Grade: G3 AJCC N Category: pN1 AJCC M Category: cM0 ER Status: Positive (+) AJCC 8 Stage Grouping: IB HER2 Status: Positive (+) Oncotype Dx Recurrence Score: Not Appropriate AJCC T Category: pT2 PR Status: Positive (+) Intent of Therapy: Curative Intent, Discussed with Patient

## 2018-03-08 ENCOUNTER — Encounter: Payer: Self-pay | Admitting: *Deleted

## 2018-03-08 ENCOUNTER — Inpatient Hospital Stay: Payer: BC Managed Care – PPO

## 2018-03-08 VITALS — BP 131/72 | HR 74 | Temp 98.6°F | Resp 16

## 2018-03-08 DIAGNOSIS — Z170 Estrogen receptor positive status [ER+]: Secondary | ICD-10-CM

## 2018-03-08 DIAGNOSIS — C50411 Malignant neoplasm of upper-outer quadrant of right female breast: Secondary | ICD-10-CM | POA: Diagnosis not present

## 2018-03-08 MED ORDER — DOCETAXEL CHEMO INJECTION 160 MG/16ML
75.0000 mg/m2 | Freq: Once | INTRAVENOUS | Status: AC
  Administered 2018-03-08: 170 mg via INTRAVENOUS
  Filled 2018-03-08: qty 17

## 2018-03-08 MED ORDER — TRASTUZUMAB CHEMO 150 MG IV SOLR
900.0000 mg | Freq: Once | INTRAVENOUS | Status: AC
  Administered 2018-03-08: 900 mg via INTRAVENOUS
  Filled 2018-03-08: qty 42.86

## 2018-03-08 MED ORDER — DIPHENHYDRAMINE HCL 25 MG PO CAPS
ORAL_CAPSULE | ORAL | Status: AC
Start: 2018-03-08 — End: ?
  Filled 2018-03-08: qty 1

## 2018-03-08 MED ORDER — PALONOSETRON HCL INJECTION 0.25 MG/5ML
INTRAVENOUS | Status: AC
  Filled 2018-03-08: qty 5

## 2018-03-08 MED ORDER — ACETAMINOPHEN 325 MG PO TABS
650.0000 mg | ORAL_TABLET | Freq: Once | ORAL | Status: AC
  Administered 2018-03-08: 650 mg via ORAL

## 2018-03-08 MED ORDER — DIPHENHYDRAMINE HCL 25 MG PO CAPS
25.0000 mg | ORAL_CAPSULE | Freq: Once | ORAL | Status: AC
  Administered 2018-03-08: 25 mg via ORAL

## 2018-03-08 MED ORDER — CARBOPLATIN CHEMO INJECTION 600 MG/60ML
750.0000 mg | Freq: Once | INTRAVENOUS | Status: AC
  Administered 2018-03-08: 750 mg via INTRAVENOUS
  Filled 2018-03-08: qty 75

## 2018-03-08 MED ORDER — HEPARIN SOD (PORK) LOCK FLUSH 100 UNIT/ML IV SOLN
500.0000 [IU] | Freq: Once | INTRAVENOUS | Status: AC | PRN
  Administered 2018-03-08: 500 [IU]
  Filled 2018-03-08: qty 5

## 2018-03-08 MED ORDER — SODIUM CHLORIDE 0.9 % IV SOLN
Freq: Once | INTRAVENOUS | Status: AC
  Administered 2018-03-08: 09:00:00 via INTRAVENOUS
  Filled 2018-03-08: qty 250

## 2018-03-08 MED ORDER — SODIUM CHLORIDE 0.9 % IV SOLN
Freq: Once | INTRAVENOUS | Status: AC
  Administered 2018-03-08: 10:00:00 via INTRAVENOUS
  Filled 2018-03-08: qty 5

## 2018-03-08 MED ORDER — SODIUM CHLORIDE 0.9% FLUSH
10.0000 mL | INTRAVENOUS | Status: DC | PRN
  Administered 2018-03-08: 10 mL
  Filled 2018-03-08: qty 10

## 2018-03-08 MED ORDER — PERTUZUMAB CHEMO INJECTION 420 MG/14ML
840.0000 mg | Freq: Once | INTRAVENOUS | Status: AC
  Administered 2018-03-08: 840 mg via INTRAVENOUS
  Filled 2018-03-08: qty 28

## 2018-03-08 MED ORDER — PALONOSETRON HCL INJECTION 0.25 MG/5ML
0.2500 mg | Freq: Once | INTRAVENOUS | Status: AC
  Administered 2018-03-08: 0.25 mg via INTRAVENOUS

## 2018-03-08 MED ORDER — ACETAMINOPHEN 325 MG PO TABS
ORAL_TABLET | ORAL | Status: AC
  Filled 2018-03-08: qty 2

## 2018-03-08 NOTE — Patient Instructions (Signed)
[facility] Discharge Instructions for Patients Receiving Chemotherapy  Today you received the following chemotherapy agents: Herceptin, Perjeta, Taxotere, Carboplatin.  To help prevent nausea and vomiting after your treatment, we encourage you to take your nausea medication as prescribed.   If you develop nausea and vomiting that is not controlled by your nausea medication, call the clinic.   BELOW ARE SYMPTOMS THAT SHOULD BE REPORTED IMMEDIATELY:  *FEVER GREATER THAN 100.5 F  *CHILLS WITH OR WITHOUT FEVER  NAUSEA AND VOMITING THAT IS NOT CONTROLLED WITH YOUR NAUSEA MEDICATION  *UNUSUAL SHORTNESS OF BREATH  *UNUSUAL BRUISING OR BLEEDING  TENDERNESS IN MOUTH AND THROAT WITH OR WITHOUT PRESENCE OF ULCERS  *URINARY PROBLEMS  *BOWEL PROBLEMS  UNUSUAL RASH Items with * indicate a potential emergency and should be followed up as soon as possible.  Feel free to call the clinic should you have any questions or concerns. The clinic phone number is [phone].  Please show the [facility] at check-in to the Emergency Department and triage nurse.   Trastuzumab injection for infusion What is this medicine? TRASTUZUMAB (tras TOO zoo mab) is a monoclonal antibody. It is used to treat breast cancer and stomach cancer. This medicine may be used for other purposes; ask your health care provider or pharmacist if you have questions. COMMON BRAND NAME(S): Herceptin What should I tell my health care provider before I take this medicine? They need to know if you have any of these conditions: -heart disease -heart failure -lung or breathing disease, like asthma -an unusual or allergic reaction to trastuzumab, benzyl alcohol, or other medications, foods, dyes, or preservatives -pregnant or trying to get pregnant -breast-feeding How should I use this medicine? This drug is given as an infusion into a vein. It is administered in a hospital or clinic by a specially  trained health care professional. Talk to your pediatrician regarding the use of this medicine in children. This medicine is not approved for use in children. Overdosage: If you think you have taken too much of this medicine contact a poison control center or emergency room at once. NOTE: This medicine is only for you. Do not share this medicine with others. What if I miss a dose? It is important not to miss a dose. Call your doctor or health care professional if you are unable to keep an appointment. What may interact with this medicine? This medicine may interact with the following medications: -certain types of chemotherapy, such as daunorubicin, doxorubicin, epirubicin, and idarubicin This list may not describe all possible interactions. Give your health care provider a list of all the medicines, herbs, non-prescription drugs, or dietary supplements you use. Also tell them if you smoke, drink alcohol, or use illegal drugs. Some items may interact with your medicine. What should I watch for while using this medicine? Visit your doctor for checks on your progress. Report any side effects. Continue your course of treatment even though you feel ill unless your doctor tells you to stop. Call your doctor or health care professional for advice if you get a fever, chills or sore throat, or other symptoms of a cold or flu. Do not treat yourself. Try to avoid being around people who are sick. You may experience fever, chills and shaking during your first infusion. These effects are usually mild and can be treated with other medicines. Report any side effects during the infusion to your health care professional. Fever and chills usually do not happen with later infusions. Do  not become pregnant while taking this medicine or for 7 months after stopping it. Women should inform their doctor if they wish to become pregnant or think they might be pregnant. Women of child-bearing potential will need to have a  negative pregnancy test before starting this medicine. There is a potential for serious side effects to an unborn child. Talk to your health care professional or pharmacist for more information. Do not breast-feed an infant while taking this medicine or for 7 months after stopping it. Women must use effective birth control with this medicine. What side effects may I notice from receiving this medicine? Side effects that you should report to your doctor or health care professional as soon as possible: -allergic reactions like skin rash, itching or hives, swelling of the face, lips, or tongue -chest pain or palpitations -cough -dizziness -feeling faint or lightheaded, falls -fever -general ill feeling or flu-like symptoms -signs of worsening heart failure like breathing problems; swelling in your legs and feet -unusually weak or tired Side effects that usually do not require medical attention (report to your doctor or health care professional if they continue or are bothersome): -bone pain -changes in taste -diarrhea -joint pain -nausea/vomiting -weight loss This list may not describe all possible side effects. Call your doctor for medical advice about side effects. You may report side effects to FDA at 1-800-FDA-1088. Where should I keep my medicine? This drug is given in a hospital or clinic and will not be stored at home. NOTE: This sheet is a summary. It may not cover all possible information. If you have questions about this medicine, talk to your doctor, pharmacist, or health care provider.  2018 Elsevier/Gold Standard (2016-07-06 14:37:52)   Pertuzumab injection What is this medicine? PERTUZUMAB (per TOOZ ue mab) is a monoclonal antibody. It is used to treat breast cancer. This medicine may be used for other purposes; ask your health care provider or pharmacist if you have questions. COMMON BRAND NAME(S): PERJETA What should I tell my health care provider before I take this  medicine? They need to know if you have any of these conditions: -heart disease -heart failure -high blood pressure -history of irregular heart beat -recent or ongoing radiation therapy -an unusual or allergic reaction to pertuzumab, other medicines, foods, dyes, or preservatives -pregnant or trying to get pregnant -breast-feeding How should I use this medicine? This medicine is for infusion into a vein. It is given by a health care professional in a hospital or clinic setting. Talk to your pediatrician regarding the use of this medicine in children. Special care may be needed. Overdosage: If you think you have taken too much of this medicine contact a poison control center or emergency room at once. NOTE: This medicine is only for you. Do not share this medicine with others. What if I miss a dose? It is important not to miss your dose. Call your doctor or health care professional if you are unable to keep an appointment. What may interact with this medicine? Interactions are not expected. Give your health care provider a list of all the medicines, herbs, non-prescription drugs, or dietary supplements you use. Also tell them if you smoke, drink alcohol, or use illegal drugs. Some items may interact with your medicine. This list may not describe all possible interactions. Give your health care provider a list of all the medicines, herbs, non-prescription drugs, or dietary supplements you use. Also tell them if you smoke, drink alcohol, or use illegal drugs. Some items  may interact with your medicine. What should I watch for while using this medicine? Your condition will be monitored carefully while you are receiving this medicine. Report any side effects. Continue your course of treatment even though you feel ill unless your doctor tells you to stop. Do not become pregnant while taking this medicine or for 7 months after stopping it. Women should inform their doctor if they wish to become  pregnant or think they might be pregnant. Women of child-bearing potential will need to have a negative pregnancy test before starting this medicine. There is a potential for serious side effects to an unborn child. Talk to your health care professional or pharmacist for more information. Do not breast-feed an infant while taking this medicine or for 7 months after stopping it. Women must use effective birth control with this medicine. Call your doctor or health care professional for advice if you get a fever, chills or sore throat, or other symptoms of a cold or flu. Do not treat yourself. Try to avoid being around people who are sick. You may experience fever, chills, and headache during the infusion. Report any side effects during the infusion to your health care professional. What side effects may I notice from receiving this medicine? Side effects that you should report to your doctor or health care professional as soon as possible: -breathing problems -chest pain or palpitations -dizziness -feeling faint or lightheaded -fever or chills -skin rash, itching or hives -sore throat -swelling of the face, lips, or tongue -swelling of the legs or ankles -unusually weak or tired Side effects that usually do not require medical attention (report to your doctor or health care professional if they continue or are bothersome): -diarrhea -hair loss -nausea, vomiting -tiredness This list may not describe all possible side effects. Call your doctor for medical advice about side effects. You may report side effects to FDA at 1-800-FDA-1088. Where should I keep my medicine? This drug is given in a hospital or clinic and will not be stored at home. NOTE: This sheet is a summary. It may not cover all possible information. If you have questions about this medicine, talk to your doctor, pharmacist, or health care provider.  2018 Elsevier/Gold Standard (2015-08-14 12:08:50)  Docetaxel injection What is  this medicine? DOCETAXEL (doe se TAX el) is a chemotherapy drug. It targets fast dividing cells, like cancer cells, and causes these cells to die. This medicine is used to treat many types of cancers like breast cancer, certain stomach cancers, head and neck cancer, lung cancer, and prostate cancer. This medicine may be used for other purposes; ask your health care provider or pharmacist if you have questions. COMMON BRAND NAME(S): Docefrez, Taxotere What should I tell my health care provider before I take this medicine? They need to know if you have any of these conditions: -infection (especially a virus infection such as chickenpox, cold sores, or herpes) -liver disease -low blood counts, like low white cell, platelet, or red cell counts -an unusual or allergic reaction to docetaxel, polysorbate 80, other chemotherapy agents, other medicines, foods, dyes, or preservatives -pregnant or trying to get pregnant -breast-feeding How should I use this medicine? This drug is given as an infusion into a vein. It is administered in a hospital or clinic by a specially trained health care professional. Talk to your pediatrician regarding the use of this medicine in children. Special care may be needed. Overdosage: If you think you have taken too much of this medicine contact a  poison control center or emergency room at once. NOTE: This medicine is only for you. Do not share this medicine with others. What if I miss a dose? It is important not to miss your dose. Call your doctor or health care professional if you are unable to keep an appointment. What may interact with this medicine? -cyclosporine -erythromycin -ketoconazole -medicines to increase blood counts like filgrastim, pegfilgrastim, sargramostim -vaccines Talk to your doctor or health care professional before taking any of these medicines: -acetaminophen -aspirin -ibuprofen -ketoprofen -naproxen This list may not describe all possible  interactions. Give your health care provider a list of all the medicines, herbs, non-prescription drugs, or dietary supplements you use. Also tell them if you smoke, drink alcohol, or use illegal drugs. Some items may interact with your medicine. What should I watch for while using this medicine? Your condition will be monitored carefully while you are receiving this medicine. You will need important blood work done while you are taking this medicine. This drug may make you feel generally unwell. This is not uncommon, as chemotherapy can affect healthy cells as well as cancer cells. Report any side effects. Continue your course of treatment even though you feel ill unless your doctor tells you to stop. In some cases, you may be given additional medicines to help with side effects. Follow all directions for their use. Call your doctor or health care professional for advice if you get a fever, chills or sore throat, or other symptoms of a cold or flu. Do not treat yourself. This drug decreases your body's ability to fight infections. Try to avoid being around people who are sick. This medicine may increase your risk to bruise or bleed. Call your doctor or health care professional if you notice any unusual bleeding. This medicine may contain alcohol in the product. You may get drowsy or dizzy. Do not drive, use machinery, or do anything that needs mental alertness until you know how this medicine affects you. Do not stand or sit up quickly, especially if you are an older patient. This reduces the risk of dizzy or fainting spells. Avoid alcoholic drinks. Do not become pregnant while taking this medicine. Women should inform their doctor if they wish to become pregnant or think they might be pregnant. There is a potential for serious side effects to an unborn child. Talk to your health care professional or pharmacist for more information. Do not breast-feed an infant while taking this medicine. What side effects  may I notice from receiving this medicine? Side effects that you should report to your doctor or health care professional as soon as possible: -allergic reactions like skin rash, itching or hives, swelling of the face, lips, or tongue -low blood counts - This drug may decrease the number of white blood cells, red blood cells and platelets. You may be at increased risk for infections and bleeding. -signs of infection - fever or chills, cough, sore throat, pain or difficulty passing urine -signs of decreased platelets or bleeding - bruising, pinpoint red spots on the skin, black, tarry stools, nosebleeds -signs of decreased red blood cells - unusually weak or tired, fainting spells, lightheadedness -breathing problems -fast or irregular heartbeat -low blood pressure -mouth sores -nausea and vomiting -pain, swelling, redness or irritation at the injection site -pain, tingling, numbness in the hands or feet -swelling of the ankle, feet, hands -weight gain Side effects that usually do not require medical attention (report to your doctor or health care professional if they continue  or are bothersome): -bone pain -complete hair loss including hair on your head, underarms, pubic hair, eyebrows, and eyelashes -diarrhea -excessive tearing -changes in the color of fingernails -loosening of the fingernails -nausea -muscle pain -red flush to skin -sweating -weak or tired This list may not describe all possible side effects. Call your doctor for medical advice about side effects. You may report side effects to FDA at 1-800-FDA-1088. Where should I keep my medicine? This drug is given in a hospital or clinic and will not be stored at home. NOTE: This sheet is a summary. It may not cover all possible information. If you have questions about this medicine, talk to your doctor, pharmacist, or health care provider.  2018 Elsevier/Gold Standard (2015-08-14 12:32:56)  Carboplatin injection What is  this medicine? CARBOPLATIN (KAR boe pla tin) is a chemotherapy drug. It targets fast dividing cells, like cancer cells, and causes these cells to die. This medicine is used to treat ovarian cancer and many other cancers. This medicine may be used for other purposes; ask your health care provider or pharmacist if you have questions. COMMON BRAND NAME(S): Paraplatin What should I tell my health care provider before I take this medicine? They need to know if you have any of these conditions: -blood disorders -hearing problems -kidney disease -recent or ongoing radiation therapy -an unusual or allergic reaction to carboplatin, cisplatin, other chemotherapy, other medicines, foods, dyes, or preservatives -pregnant or trying to get pregnant -breast-feeding How should I use this medicine? This drug is usually given as an infusion into a vein. It is administered in a hospital or clinic by a specially trained health care professional. Talk to your pediatrician regarding the use of this medicine in children. Special care may be needed. Overdosage: If you think you have taken too much of this medicine contact a poison control center or emergency room at once. NOTE: This medicine is only for you. Do not share this medicine with others. What if I miss a dose? It is important not to miss a dose. Call your doctor or health care professional if you are unable to keep an appointment. What may interact with this medicine? -medicines for seizures -medicines to increase blood counts like filgrastim, pegfilgrastim, sargramostim -some antibiotics like amikacin, gentamicin, neomycin, streptomycin, tobramycin -vaccines Talk to your doctor or health care professional before taking any of these medicines: -acetaminophen -aspirin -ibuprofen -ketoprofen -naproxen This list may not describe all possible interactions. Give your health care provider a list of all the medicines, herbs, non-prescription drugs, or  dietary supplements you use. Also tell them if you smoke, drink alcohol, or use illegal drugs. Some items may interact with your medicine. What should I watch for while using this medicine? Your condition will be monitored carefully while you are receiving this medicine. You will need important blood work done while you are taking this medicine. This drug may make you feel generally unwell. This is not uncommon, as chemotherapy can affect healthy cells as well as cancer cells. Report any side effects. Continue your course of treatment even though you feel ill unless your doctor tells you to stop. In some cases, you may be given additional medicines to help with side effects. Follow all directions for their use. Call your doctor or health care professional for advice if you get a fever, chills or sore throat, or other symptoms of a cold or flu. Do not treat yourself. This drug decreases your body's ability to fight infections. Try to avoid being around  people who are sick. This medicine may increase your risk to bruise or bleed. Call your doctor or health care professional if you notice any unusual bleeding. Be careful brushing and flossing your teeth or using a toothpick because you may get an infection or bleed more easily. If you have any dental work done, tell your dentist you are receiving this medicine. Avoid taking products that contain aspirin, acetaminophen, ibuprofen, naproxen, or ketoprofen unless instructed by your doctor. These medicines may hide a fever. Do not become pregnant while taking this medicine. Women should inform their doctor if they wish to become pregnant or think they might be pregnant. There is a potential for serious side effects to an unborn child. Talk to your health care professional or pharmacist for more information. Do not breast-feed an infant while taking this medicine. What side effects may I notice from receiving this medicine? Side effects that you should report to  your doctor or health care professional as soon as possible: -allergic reactions like skin rash, itching or hives, swelling of the face, lips, or tongue -signs of infection - fever or chills, cough, sore throat, pain or difficulty passing urine -signs of decreased platelets or bleeding - bruising, pinpoint red spots on the skin, black, tarry stools, nosebleeds -signs of decreased red blood cells - unusually weak or tired, fainting spells, lightheadedness -breathing problems -changes in hearing -changes in vision -chest pain -high blood pressure -low blood counts - This drug may decrease the number of white blood cells, red blood cells and platelets. You may be at increased risk for infections and bleeding. -nausea and vomiting -pain, swelling, redness or irritation at the injection site -pain, tingling, numbness in the hands or feet -problems with balance, talking, walking -trouble passing urine or change in the amount of urine Side effects that usually do not require medical attention (report to your doctor or health care professional if they continue or are bothersome): -hair loss -loss of appetite -metallic taste in the mouth or changes in taste This list may not describe all possible side effects. Call your doctor for medical advice about side effects. You may report side effects to FDA at 1-800-FDA-1088. Where should I keep my medicine? This drug is given in a hospital or clinic and will not be stored at home. NOTE: This sheet is a summary. It may not cover all possible information. If you have questions about this medicine, talk to your doctor, pharmacist, or health care provider.  2018 Elsevier/Gold Standard (2007-10-17 14:38:05)    Implanted [facility] Guide An implanted port is a type of central line that is placed under the skin. Central lines are used to provide IV access when treatment or nutrition needs to be given through a person's veins. Implanted ports are used for  long-term IV access. An implanted port may be placed because:  You need IV medicine that would be irritating to the small veins in your hands or arms.  You need long-term IV medicines, such as antibiotics.  You need IV nutrition for a long period.  You need frequent blood draws for lab tests.  You need dialysis.  Implanted ports are usually placed in the chest area, but they can also be placed in the upper arm, the abdomen, or the leg. An implanted port has two main parts:  Reservoir. The reservoir is round and will appear as a small, raised area under your skin. The reservoir is the part where a needle is inserted to give medicines or  draw blood.  Catheter. The catheter is a thin, flexible tube that extends from the reservoir. The catheter is placed into a large vein. Medicine that is inserted into the reservoir goes into the catheter and then into the vein.  How will I care for my incision site? Do not get the incision site wet. Bathe or shower as directed by your health care provider. How is my port accessed? Special steps must be taken to access the port:  Before the port is accessed, a numbing cream can be placed on the skin. This helps numb the skin over the port site.  Your health care provider uses a sterile technique to access the port. ? Your health care provider must put on a mask and sterile gloves. ? The skin over your port is cleaned carefully with an antiseptic and allowed to dry. ? The port is gently pinched between sterile gloves, and a needle is inserted into the port.  Only "non-coring" port needles should be used to access the port. Once the port is accessed, a blood return should be checked. This helps ensure that the port is in the vein and is not clogged.  If your port needs to remain accessed for a constant infusion, a clear (transparent) bandage will be placed over the needle site. The bandage and needle will need to be changed every week, or as directed by  your health care provider.  Keep the bandage covering the needle clean and dry. Do not get it wet. Follow your health care provider's instructions on how to take a shower or bath while the port is accessed.  If your port does not need to stay accessed, no bandage is needed over the port.  What is flushing? Flushing helps keep the port from getting clogged. Follow your health care provider's instructions on how and when to flush the port. Ports are usually flushed with saline solution or a medicine called heparin. The need for flushing will depend on how the port is used.  If the port is used for intermittent medicines or blood draws, the port will need to be flushed: ? After medicines have been given. ? After blood has been drawn. ? As part of routine maintenance.  If a constant infusion is running, the port may not need to be flushed.  How long will my port stay implanted? The port can stay in for as long as your health care provider thinks it is needed. When it is time for the port to come out, surgery will be done to remove it. The procedure is similar to the one performed when the port was put in. When should I seek immediate medical care? When you have an implanted port, you should seek immediate medical care if:  You notice a bad smell coming from the incision site.  You have swelling, redness, or drainage at the incision site.  You have more swelling or pain at the port site or the surrounding area.  You have a fever that is not controlled with medicine.  This information is not intended to replace advice given to you by your health care provider. Make sure you discuss any questions you have with your health care provider. Document Released: 07/12/2005 Document Revised: 12/18/2015 Document Reviewed: 03/19/2013 Elsevier Interactive Patient Education  2017 [employer].

## 2018-03-08 NOTE — Progress Notes (Signed)
Okay to tx with labs from 02/15/18 per Dr. Jana Hakim.

## 2018-03-10 ENCOUNTER — Telehealth: Payer: Self-pay | Admitting: Adult Health

## 2018-03-10 ENCOUNTER — Other Ambulatory Visit: Payer: Self-pay | Admitting: Oncology

## 2018-03-10 ENCOUNTER — Inpatient Hospital Stay: Payer: BC Managed Care – PPO

## 2018-03-10 DIAGNOSIS — C50411 Malignant neoplasm of upper-outer quadrant of right female breast: Secondary | ICD-10-CM | POA: Diagnosis not present

## 2018-03-10 DIAGNOSIS — Z170 Estrogen receptor positive status [ER+]: Secondary | ICD-10-CM

## 2018-03-10 MED ORDER — PEGFILGRASTIM-CBQV 6 MG/0.6ML ~~LOC~~ SOSY
6.0000 mg | PREFILLED_SYRINGE | Freq: Once | SUBCUTANEOUS | Status: AC
  Administered 2018-03-10: 6 mg via SUBCUTANEOUS

## 2018-03-10 NOTE — Patient Instructions (Signed)
Pegfilgrastim injection What is this medicine? PEGFILGRASTIM (PEG fil gra stim) is a long-acting granulocyte colony-stimulating factor that stimulates the growth of neutrophils, a type of white blood cell important in the body's fight against infection. It is used to reduce the incidence of fever and infection in patients with certain types of cancer who are receiving chemotherapy that affects the bone marrow, and to increase survival after being exposed to high doses of radiation. This medicine may be used for other purposes; ask your health care provider or pharmacist if you have questions. COMMON BRAND NAME(S): Neulasta What should I tell my health care provider before I take this medicine? They need to know if you have any of these conditions: -kidney disease -latex allergy -ongoing radiation therapy -sickle cell disease -skin reactions to acrylic adhesives (On-Body Injector only) -an unusual or allergic reaction to pegfilgrastim, filgrastim, other medicines, foods, dyes, or preservatives -pregnant or trying to get pregnant -breast-feeding How should I use this medicine? This medicine is for injection under the skin. If you get this medicine at home, you will be taught how to prepare and give the pre-filled syringe or how to use the On-body Injector. Refer to the patient Instructions for Use for detailed instructions. Use exactly as directed. Tell your healthcare provider immediately if you suspect that the On-body Injector may not have performed as intended or if you suspect the use of the On-body Injector resulted in a missed or partial dose. It is important that you put your used needles and syringes in a special sharps container. Do not put them in a trash can. If you do not have a sharps container, call your pharmacist or healthcare provider to get one. Talk to your pediatrician regarding the use of this medicine in children. While this drug may be prescribed for selected conditions,  precautions do apply. Overdosage: If you think you have taken too much of this medicine contact a poison control center or emergency room at once. NOTE: This medicine is only for you. Do not share this medicine with others. What if I miss a dose? It is important not to miss your dose. Call your doctor or health care professional if you miss your dose. If you miss a dose due to an On-body Injector failure or leakage, a new dose should be administered as soon as possible using a single prefilled syringe for manual use. What may interact with this medicine? Interactions have not been studied. Give your health care provider a list of all the medicines, herbs, non-prescription drugs, or dietary supplements you use. Also tell them if you smoke, drink alcohol, or use illegal drugs. Some items may interact with your medicine. This list may not describe all possible interactions. Give your health care provider a list of all the medicines, herbs, non-prescription drugs, or dietary supplements you use. Also tell them if you smoke, drink alcohol, or use illegal drugs. Some items may interact with your medicine. What should I watch for while using this medicine? You may need blood work done while you are taking this medicine. If you are going to need a MRI, CT scan, or other procedure, tell your doctor that you are using this medicine (On-Body Injector only). What side effects may I notice from receiving this medicine? Side effects that you should report to your doctor or health care professional as soon as possible: -allergic reactions like skin rash, itching or hives, swelling of the face, lips, or tongue -dizziness -fever -pain, redness, or irritation at site   where injected -pinpoint red spots on the skin -red or dark-brown urine -shortness of breath or breathing problems -stomach or side pain, or pain at the shoulder -swelling -tiredness -trouble passing urine or change in the amount of urine Side  effects that usually do not require medical attention (report to your doctor or health care professional if they continue or are bothersome): -bone pain -muscle pain This list may not describe all possible side effects. Call your doctor for medical advice about side effects. You may report side effects to FDA at 1-800-FDA-1088. Where should I keep my medicine? Keep out of the reach of children. Store pre-filled syringes in a refrigerator between 2 and 8 degrees C (36 and 46 degrees F). Do not freeze. Keep in carton to protect from light. Throw away this medicine if it is left out of the refrigerator for more than 48 hours. Throw away any unused medicine after the expiration date. NOTE: This sheet is a summary. It may not cover all possible information. If you have questions about this medicine, talk to your doctor, pharmacist, or health care provider.  2018 Elsevier/Gold Standard (2016-07-08 12:58:03)  

## 2018-03-13 ENCOUNTER — Telehealth: Payer: Self-pay | Admitting: *Deleted

## 2018-03-13 MED ORDER — FAMOTIDINE 20 MG PO TABS: 20.0000 mg | ORAL_TABLET | Freq: Two times a day (BID) | ORAL | 3 refills | Status: DC

## 2018-03-13 NOTE — Telephone Encounter (Signed)
This RN called pt per faxed communication from [place] stating pt called with issues including symptoms of indigestion.  Note recommendation was to follow up with primary care MD.  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  She used tums and mylanta with some benefit now experiencing mild nausea and " diarrhea ".  Anjelika states stools are " mushy " but not watery.  She took a compazine last night with some benefit and then took a dexamethasone.  This RN informed pt likely she is experiencing acid reflux from the dexamethesome- ideally treated with pepcid.  Recommended she also take the compazine at this time for nause not the dexamethasone ( to put away until next treatment ).  Pepcid will be called into her pharmacy.  She is to eat non greasy food for aid in indigestion.  Jowana verbalized understanding and appreciation of call.

## 2018-03-14 ENCOUNTER — Telehealth: Payer: Self-pay | Admitting: Adult Health

## 2018-03-14 NOTE — Telephone Encounter (Signed)
Patient scheduled per 8/19 sch message.  °

## 2018-03-14 NOTE — Progress Notes (Addendum)
[place]  Telephone:([phone]) [phone] Fax:(336) [phone]     ID: Kelly Rowe DOB: 03-Jul-1971  MR#: [id number]  JYN#:[id number]  Patient Care Team: Biagio Borg, MD as PCP - Kelly Citizen, MD as Consulting Physician (General Surgery) [name], [name], MD as Consulting Physician (Oncology) [name], MD as Attending Physician (Radiation Oncology) [name], NP as Nurse Practitioner (Obstetrics and Gynecology) Marlinda Mike, PA-C as Referring Physician (Physician Assistant) OTHER MD:   CHIEF COMPLAINT: Triple positive breast cancer  CURRENT TREATMENT: Awaiting definitive surgery   HISTORY OF CURRENT ILLNESS: From the original intake note:  "Kelly Rowe" noticed bruising in her lateral right breast and palpated a mass  on 12/23/2017. She followed up with her gynecologist. She underwent unilateral right diagnostic mammography with tomography and right breast ultrasonography at [facility] on 01/05/2018 showing: breast density category B. There is an irregular highly suspicious mass within the right breast at the 9:30 o'clock upper outer quadrant, measuring 1.8 x 1.1 x 1.5 cm, and located 18 cm from the nipple,  Ultrasonography revealed a single morphologically abnormal lymph node in the RIGHT axilla, with cortical thickness of 6 mm.   Accordingly on 01/11/2018 she proceeded to biopsy of the right breast area in question as well as a suspicious lymph node. The pathology from this procedure showed ([id number]): Invasive ductal carcinoma grade III.  The lymph node biopsied was negative for carcinoma (concordant).. Prognostic indicators significant for: estrogen receptor, 60% positive with weak staining intensity and progesterone receptor, 10% positive with strong staining intensity. Proliferation marker Ki67 at 30%. HER2 amplified with ratios HER2/CEP17 signals 2.26 and average HER2 copies per cell 3.50  The patient's subsequent history is as detailed  below.  INTERVAL HISTORY: Kelly Rowe returns today for follow up and treatment of her triple positive breast cancer accompanied by her mother. She is receiving neoadjuvant chemotherapy with Docetaxel, Carboplatin, Trastuzumab, Pertuzumab given on day 1 of a 21 day cycle.  She receives growth factor support on day 3 with Udenyca.  Today is cycle 1 day 8 of treatment.    REVIEW OF SYSTEMS: Kelly Rowe is not feeling well today.  She is feeling weak and dehydrated.  She is fatigued.  She has started feeling poorly over the past three days.  She began experiencing diarrhea on Monday.  She has been having 8-10 bowel movements over the past 2-3 days.  She has been taking her anti nausea medication and imodium.  She has taken two tablets of imodium.  She also has been experiencing reflux.  She called in and spoke to [place], [place] on Monday about this.  She was recommended to start on Pepcid.  The pepcid has helped.    Kelly Rowe is eating and drinking.  She has continued to experience nausea.  She is taking Compazine and hasn't noted a lot of a relief at this point.  Kelly Rowe notes bleeding external hemorrhoids that have made it uncomfortable to sit.  She denies fevers or chills.     PAST MEDICAL HISTORY: Past Medical History:  Diagnosis Date  . ANEMIA-IRON DEFICIENCY 01/30/2010  . ANXIETY 11/27/2007  . Arthritis    both knees  . Asthma 02/25/2011  . Carbuncle and furuncle of trunk 04/16/2010  . Chlamydia infection 03/21/2008  . DIABETES MELLITUS, TYPE II 08/02/2007  . Edema 01/30/2010  . ELEVATED BLOOD PRESSURE WITHOUT DIAGNOSIS OF HYPERTENSION 08/02/2007  . Family history of breast cancer   . Family history of colon cancer   . Family history  of lung cancer   . FREQUENCY, URINARY 05/19/2009  . GENITAL HERPES 03/12/2009  . HIV (human immunodeficiency virus infection) ([place]) 2009  . HIV INFECTION 03/12/2009  . HSV (herpes simplex virus) infection   . HYPERLIPIDEMIA 08/02/2007  . Hypertension   . Metrorrhagia 03/21/2008   . PONV (postoperative nausea and vomiting)   . RETENTION, URINE 05/19/2009  . Trichomonas infection 01/19/2010  . VITAMIN D DEFICIENCY 01/30/2010    PAST SURGICAL HISTORY: Past Surgical History:  Procedure Laterality Date  . ABDOMINAL HYSTERECTOMY  07/22/2010   TAH WITH PRESERVATION OF BOTH TUBES AND OVARIES  . BREAST LUMPECTOMY WITH RADIOACTIVE SEED AND SENTINEL LYMPH NODE BIOPSY Right 02/23/2018   Procedure: BREAST LUMPECTOMY WITH RADIOACTIVE SEED AND SENTINEL LYMPH NODE BIOPSY;  Surgeon: Stark Klein, MD;  Location: [facility];  Service: General;  Laterality: Right;  . CHOLECYSTECTOMY    . ENDOMETRIAL ABLATION  01/11/2008   HER OPTION  . ESOPHAGOGASTRODUODENOSCOPY    . MULTIPLE TOOTH EXTRACTIONS    . PORTACATH PLACEMENT Left 02/23/2018   Procedure: INSERTION PORT-A-CATH;  Surgeon: Stark Klein, MD;  Location: [facility];  Service: General;  Laterality: Left;  . TUBAL LIGATION    . WISDOM TOOTH EXTRACTION      FAMILY HISTORY Family History  Problem Relation Age of Onset  . Prostate cancer Other   . Heart disease Other   . Stroke Other   . Diabetes Mother   . Hypertension Mother   . Diabetes Father   . Cancer Father        COLON and LU NG  . Stroke Paternal Uncle   . Breast cancer Paternal Aunt   . Lung cancer Maternal Grandmother 66       Mesothelioma  . Colon cancer Paternal Grandfather        dx over 65s  . Lung cancer Paternal Aunt   . Breast cancer Paternal Aunt   . Breast cancer Paternal Aunt   . Heart attack Maternal Grandfather 71  . Colon cancer Other        MGM's 5 brothers  The patient's father died at age 28 due to metastatic liver cancer. The patient's mother is alive at 32. The patient's has 1 brother and 3 sisters. There was a paternal grandfather with colon cancer. There were 3 paternal aunts with breast cancer, 2 diagnosed in the 3's and 1 at age 34. There was a maternal grandmother with mesothelioma. The patient otherwise denies a history of ovarian cancer in  the family.    GYNECOLOGIC HISTORY:  Patient's last menstrual period was 06/21/2010. Menarche: 47 years old Age at first live birth: 47 years old She is GXP5. Her LMP was December 2011. She is status post partial hysterectomy without oophorectomy. She took oral contraception for 3 years with no complications. She never took HRT.    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  At home are 2 of her sons, Glendell Docker and [name], and the patient's grandson, Amador Cunas 3 (who is Willie's son). The patient's oldest is [place] age 48 who lives in [place] as a cook. Daughter, [place] age 50 works as a Scientist, water quality. Son, Glendell Docker age 72 is disabled. Son, [name] age 93 lives in [place] as a Microbiologist. Daughter, Benard Rink age 63 also is a Microbiologist. The patient has 5 grandchildren and no great grandchildren. The patient does not belong to a church.       ADVANCED DIRECTIVES: Not in place; at the 01/18/2018 visit the patient was given the  appropriate documents to complete on notarized at her discretion   HEALTH MAINTENANCE: Social History   Tobacco Use  . Smoking status: Never Smoker  . Smokeless tobacco: Never Used  Substance Use Topics  . Alcohol use: Yes    Alcohol/week: 0.0 standard drinks    Comment: OCCASIONALLY  . Drug use: No     Colonoscopy: Not yet  PAP: November 2018  Bone density: Never   Allergies  Allergen Reactions  . Levaquin [Levofloxacin In D5w] Nausea And Vomiting and Rash  . Penicillins Swelling and Rash    Has patient had a PCN reaction causing immediate rash, facial/tongue/throat swelling, SOB or lightheadedness with hypotension: Yes Has patient had a PCN reaction causing severe rash involving mucus membranes or skin necrosis: No Has patient had a PCN reaction that required hospitalization: Yes Has patient had a PCN reaction occurring within the last 10 years: No If all of the above answers are "NO", then may proceed with Cephalosporin use.    Current Outpatient  Medications  Medication Sig Dispense Refill  . albuterol (PROVENTIL HFA;VENTOLIN HFA) 108 (90 Base) MCG/ACT inhaler Inhale 2 puffs into the lungs every 6 (six) hours as needed for wheezing or shortness of breath.    Marland Kitchen aspirin 81 MG tablet Take 81 mg by mouth daily as needed (chest pain).     . Azelastine-Fluticasone (DYMISTA) 137-50 MCG/ACT SUSP Use as directed 1 spray each side twice per day as needed (Patient taking differently: Place 1 spray into both nostrils 2 (two) times daily as needed (alleriges). ) 23 g 5  . Beclomethasone Dipropionate (QNASL) 80 MCG/ACT AERS Place 2 sprays into the nose daily. (Patient taking differently: Place 2 sprays into the nose daily as needed (congestion). ) 1 Inhaler 1  . blood glucose meter kit and supplies Dispense based on patient and insurance preference. Use up to four times daily as directed. (FOR ICD-10 E10.9, E11.9). 1 each 0  . Cholecalciferol (VITAMIN D) 2000 units tablet Take 2,000 Units by mouth daily.    Marland Kitchen dexamethasone (DECADRON) 4 MG tablet Take 2 tablets (8 mg total) by mouth 2 (two) times daily. Start the day before Taxotere. Take once the day after, then 2 times a day x 2d. 30 tablet 1  . Diclofenac Sodium (PENNSAID) 2 % SOLN Place 1 application onto the skin 2 (two) times daily. (Patient taking differently: Place 1 application onto the skin 2 (two) times daily as needed (pain). ) 1 Bottle 3  . diphenhydrAMINE HCl (ZZZQUIL) 50 MG/30ML LIQD Take 50 mg by mouth at bedtime as needed (sleep).    . Dulaglutide (TRULICITY) 8.25 OI/3.7CW SOPN Inject 0.75 mg into the skin once a week. 6 mL 11  . elvitegravir-cobicistat-emtricitabine-tenofovir (STRIBILD) 150-150-200-300 MG TABS tablet Take 1 tablet daily by mouth.    . famotidine (PEPCID) 20 MG tablet Take 1 tablet (20 mg total) by mouth 2 (two) times daily. 60 tablet 3  . ferrous gluconate (IRON 27) 240 (27 FE) MG tablet Take 480 mg by mouth daily.    Marland Kitchen glipiZIDE (GLIPIZIDE XL) 5 MG 24 hr tablet Take 1  tablet (5 mg total) by mouth daily with breakfast. 90 tablet 3  . ibuprofen (ADVIL,MOTRIN) 800 MG tablet Take 1 tablet (800 mg total) by mouth every 8 (eight) hours as needed. (Patient taking differently: Take 800 mg by mouth daily as needed for moderate pain. ) 60 tablet 0  . lidocaine-prilocaine (EMLA) cream Apply to affected area once 30 g 3  . LORazepam (  ATIVAN) 0.5 MG tablet Take 1 tablet (0.5 mg total) by mouth at bedtime as needed (Nausea or vomiting). 30 tablet 0  . metFORMIN (GLUCOPHAGE-XR) 500 MG 24 hr tablet Take 2 tablets (1,000 mg total) by mouth daily with breakfast. Annual appt is due must see provider for future refills 180 tablet 3  . Multiple Vitamin (MULTIVITAMIN WITH MINERALS) TABS tablet Take 1 tablet by mouth daily.    Marland Kitchen oxyCODONE (OXY IR/ROXICODONE) 5 MG immediate release tablet Take 1-2 tablets (5-10 mg total) by mouth every 6 (six) hours as needed for moderate pain, severe pain or breakthrough pain. 30 tablet 0  . polyethylene glycol (MIRALAX / GLYCOLAX) packet Take 17 g by mouth daily as needed for moderate constipation.    . prochlorperazine (COMPAZINE) 10 MG tablet Take 1 tablet (10 mg total) by mouth every 6 (six) hours as needed (Nausea or vomiting). 30 tablet 1  . rosuvastatin (CRESTOR) 40 MG tablet Take 40 mg by mouth every evening.    . traMADol (ULTRAM) 50 MG tablet Take 50 mg by mouth daily as needed for moderate pain.    . valACYclovir (VALTREX) 500 MG tablet Take twice daily for 3-5 days (Patient taking differently: Take 500 mg by mouth 2 (two) times daily as needed (fever blisters). Take for 3-5 days) 30 tablet 12  . zolpidem (AMBIEN) 10 MG tablet Take 10 mg by mouth at bedtime as needed for sleep.    . hydrocortisone (ANUSOL-HC) 2.5 % rectal cream Place 1 application rectally 2 (two) times daily. 30 g 0  . omeprazole (PRILOSEC) 40 MG capsule Take 1 capsule (40 mg total) by mouth daily. 30 capsule 0  . ondansetron (ZOFRAN) 8 MG tablet Take 1 tablet (8 mg total) by  mouth every 8 (eight) hours as needed for nausea or vomiting. 20 tablet 0   Current Facility-Administered Medications  Medication Dose Route Frequency Provider Last Rate Last Dose  . sodium chloride flush (NS) 0.9 % injection 10 mL  10 mL Intravenous PRN [name], [name], MD   10 mL at 03/15/18 1257   Facility-Administered Medications Ordered in Other Visits  Medication Dose Route Frequency Provider Last Rate Last Dose  . sodium chloride flush (NS) 0.9 % injection 10 mL  10 mL Intracatheter Once [name], [name], MD        OBJECTIVE: Vitals:   03/15/18 0902  BP: 128/81  Pulse: 96  Resp: 18  Temp: 98.8 F (37.1 C)  SpO2: 99%     Body mass index is 45.82 kg/m.   Wt Readings from Last 3 Encounters:  03/15/18 250 lb 8 oz (113.6 kg)  03/03/18 262 lb 12.8 oz (119.2 kg)  02/23/18 259 lb (117.5 kg)  ECOG FS:2 GENERAL: Patient is a well appearing female in no acute distress HEENT:  Sclerae anicteric.  Oropharynx clear and moist. No ulcerations or evidence of oropharyngeal candidiasis. Neck is supple.  NODES:  No cervical, supraclavicular, or axillary lymphadenopathy palpated.  BREAST EXAM:  Deferred. LUNGS:  Clear to auscultation bilaterally.  No wheezes or rhonchi. HEART:  Regular rate and rhythm. No murmur appreciated. ABDOMEN:  Soft, nontender.  Positive, normoactive bowel sounds. No organomegaly palpated. MSK:  No focal spinal tenderness to palpation. Full range of motion bilaterally in the upper extremities. EXTREMITIES:  No peripheral edema.   SKIN:  Clear with no obvious rashes or skin changes. No nail dyscrasia. 4 external non bleeding hemorrhoids noted NEURO:  Nonfocal. Well oriented.  Appropriate affect.    LAB  RESULTS:  CMP     Component Value Date/Time   NA 133 (L) 03/15/2018 0838   K 3.3 (L) 03/15/2018 0838   CL 96 (L) 03/15/2018 0838   CO2 26 03/15/2018 0838   GLUCOSE 335 (H) 03/15/2018 0838   BUN 11 03/15/2018 0838   CREATININE 0.80 03/15/2018 0838    CALCIUM 8.3 (L) 03/15/2018 0838   PROT 6.9 03/15/2018 0838   ALBUMIN 3.4 (L) 03/15/2018 0838   AST 17 03/15/2018 0838   ALT 27 03/15/2018 0838   ALKPHOS 166 (H) 03/15/2018 0838   BILITOT 0.3 03/15/2018 0838   GFRNONAA >60 03/15/2018 0838   GFRAA >60 03/15/2018 0838    No results found for: TOTALPROTELP, ALBUMINELP, A1GS, A2GS, BETS, BETA2SER, GAMS, MSPIKE, SPEI  No results found for: KPAFRELGTCHN, LAMBDASER, KAPLAMBRATIO  Lab Results  Component Value Date   WBC 3.7 (L) 03/15/2018   NEUTROABS 1.2 (L) 03/15/2018   HGB 12.6 03/15/2018   HCT 38.8 03/15/2018   MCV 84.4 03/15/2018   PLT 210 03/15/2018    _0 @  No results found for: LABCA2  No components found for: [id number]  No results for input(s): INR in the last 168 hours.  No results found for: LABCA2  No results found for: NFA213  No results found for: YQM578  No results found for: ION629  No results found for: CA2729  No components found for: HGQUANT  No results found for: CEA1 / No results found for: CEA1   No results found for: AFPTUMOR  No results found for: CHROMOGRNA  No results found for: PSA1  Appointment on 03/15/2018  Component Date Value Ref Range Status  . Sodium 03/15/2018 133* 135 - 145 mmol/L Final  . Potassium 03/15/2018 3.3* 3.5 - 5.1 mmol/L Final  . Chloride 03/15/2018 96* 98 - 111 mmol/L Final  . CO2 03/15/2018 26  22 - 32 mmol/L Final  . Glucose, Bld 03/15/2018 335* 70 - 99 mg/dL Final  . BUN 03/15/2018 11  6 - 20 mg/dL Final  . Creatinine 03/15/2018 0.80  0.44 - 1.00 mg/dL Final  . Calcium 03/15/2018 8.3* 8.9 - 10.3 mg/dL Final  . Total Protein 03/15/2018 6.9  6.5 - 8.1 g/dL Final  . Albumin 03/15/2018 3.4* 3.5 - 5.0 g/dL Final  . AST 03/15/2018 17  15 - 41 U/L Final  . ALT 03/15/2018 27  0 - 44 U/L Final  . Alkaline Phosphatase 03/15/2018 166* 38 - 126 U/L Final  . Total Bilirubin 03/15/2018 0.3  0.3 - 1.2 mg/dL Final  . GFR, Est Non Af Am 03/15/2018 >60  >60 mL/min  Final  . GFR, Est AFR Am 03/15/2018 >60  >60 mL/min Final   Comment: (NOTE) The eGFR has been calculated using the CKD EPI equation. This calculation has not been validated in all clinical situations. eGFR's persistently <60 mL/min signify possible Chronic Kidney Disease.   Georgiann Hahn gap 03/15/2018 11  5 - 15 Final   Performed at [facility], [place] [address] [place], [place] [address]  . WBC Count 03/15/2018 3.7* 3.9 - 10.3 K/uL Final  . RBC 03/15/2018 4.60  3.70 - 5.45 MIL/uL Final  . Hemoglobin 03/15/2018 12.6  11.6 - 15.9 g/dL Final  . HCT 03/15/2018 38.8  34.8 - 46.6 % Final  . MCV 03/15/2018 84.4  79.5 - 101.0 fL Final  . MCH 03/15/2018 27.5  25.1 - 34.0 pg Final  . MCHC 03/15/2018 32.6  31.5 - 36.0 g/dL Final  . RDW  03/15/2018 14.0  11.2 - 14.5 % Final  . Platelet Count 03/15/2018 210  145 - 400 K/uL Final  . Neutrophils Relative % 03/15/2018 33  % Final  . Neutro Abs 03/15/2018 1.2* 1.5 - 6.5 K/uL Final  . Lymphocytes Relative 03/15/2018 59  % Final  . Lymphs Abs 03/15/2018 2.2  0.9 - 3.3 K/uL Final  . Monocytes Relative 03/15/2018 7  % Final  . Monocytes Absolute 03/15/2018 0.2  0.1 - 0.9 K/uL Final  . Eosinophils Relative 03/15/2018 0  % Final  . Eosinophils Absolute 03/15/2018 0.0  0.0 - 0.5 K/uL Final  . Basophils Relative 03/15/2018 1  % Final  . Basophils Absolute 03/15/2018 0.0  0.0 - 0.1 K/uL Final   Performed at [facility], [address] [place], [place] [address]    (this displays the last labs from the last 3 days)  No results found for: TOTALPROTELP, ALBUMINELP, A1GS, A2GS, BETS, BETA2SER, GAMS, MSPIKE, SPEI (this displays SPEP labs)  No results found for: KPAFRELGTCHN, LAMBDASER, KAPLAMBRATIO (kappa/lambda light chains)  No results found for: HGBA, HGBA2QUANT, HGBFQUANT, HGBSQUAN (Hemoglobinopathy evaluation)   No results found for: LDH  Lab Results  Component Value Date   IRON 35 (L) 01/30/2010    IRONPCTSAT 8.8 (L) 01/30/2010   (Iron and TIBC)  No results found for: FERRITIN  Urinalysis    Component Value Date/Time   COLORURINE DARK YELLOW 01/30/2018 1534   APPEARANCEUR CLOUDY (A) 01/30/2018 1534   LABSPEC 1.025 01/30/2018 1534   PHURINE 5.5 01/30/2018 1534   GLUCOSEU NEGATIVE 01/30/2018 1534   GLUCOSEU NEGATIVE 09/27/2017 1002   HGBUR 1+ (A) 01/30/2018 1534   BILIRUBINUR NEGATIVE 09/27/2017 1002   KETONESUR TRACE (A) 01/30/2018 1534   PROTEINUR TRACE (A) 01/30/2018 1534   UROBILINOGEN 0.2 09/27/2017 1002   NITRITE NEGATIVE 09/27/2017 1002   LEUKOCYTESUR NEGATIVE 09/27/2017 1002     STUDIES: Nm Sentinel Node Inj-no Rpt (breast)  Result Date: 02/23/2018 Sulfur colloid was injected by the nuclear medicine technologist for melanoma sentinel node.   Mm Breast Surgical Specimen  Result Date: 02/23/2018 CLINICAL DATA:  Evaluate specimen EXAM: SPECIMEN RADIOGRAPH OF THE RIGHT BREAST COMPARISON:  Previous exam(s). FINDINGS: Status post excision of the right breast. The radioactive seed and biopsy marker clip are present, completely intact, and were marked for pathology. IMPRESSION: Specimen radiograph of the right breast. Electronically Signed   By: Dorise Bullion III M.D   On: 02/23/2018 13:57   Dg Chest Port 1 View  Result Date: 02/23/2018 CLINICAL DATA:  Port-A-Cath placement. EXAM: PORTABLE CHEST 1 VIEW COMPARISON:  CT chest 02/06/2018 FINDINGS: Power port inserted from the left subclavian vein with its tip in the SVC at the azygos level. The catheter either takes a tight loop or is kinked just lateral to the second rib. No pneumothorax. Cardiomegaly. Venous hypertension/fluid overload. IMPRESSION: Power port in place with the tip in the SVC at the azygos level. No pneumothorax. The catheter either takes a tight loop or a kink just lateral to the second rib. Electronically Signed   By: Nelson Chimes M.D.   On: 02/23/2018 16:00   Dg Fluoro Guide Cv Line-no Report  Result Date:  02/23/2018 Fluoroscopy was utilized by the requesting physician.  No radiographic interpretation.   Mm Rt Radioactive Seed Loc Mammo Guide  Result Date: 02/22/2018 CLINICAL DATA:  Patient presents for seed localization prior to RIGHT lumpectomy for grade 3 invasive ductal carcinoma. EXAM: MAMMOGRAPHIC GUIDED RADIOACTIVE SEED LOCALIZATION OF THE  RIGHT BREAST COMPARISON:  Previous exam(s). FINDINGS: Patient presents for radioactive seed localization prior to lumpectomy. I met with the patient and we discussed the procedure of seed localization including benefits and alternatives. We discussed the high likelihood of a successful procedure. We discussed the risks of the procedure including infection, bleeding, tissue injury and further surgery. We discussed the low dose of radioactivity involved in the procedure. Informed, written consent was given. The usual time-out protocol was performed immediately prior to the procedure. Using mammographic guidance, sterile technique, 1% lidocaine and an I-125 radioactive seed, the ribbon shaped clip within the mass in the UPPER-OUTER QUADRANT of the RIGHT breast was localized using a superior to inferior approach. The follow-up mammogram images confirm the seed in the expected location and were marked for Dr. Barry Dienes. Follow-up survey of the patient confirms presence of the radioactive seed. Order number of I-125 seed:  [id number]. Total activity:  8.295 millicurie.  Reference Date: 02/10/2018 The patient tolerated the procedure well and was released from the [facility]. She was given instructions regarding seed removal. IMPRESSION: Radioactive seed localization right breast. No apparent complications. Electronically Signed   By: Nolon Nations M.D.   On: 02/22/2018 14:51    ELIGIBLE FOR AVAILABLE RESEARCH PROTOCOL: no  ASSESSMENT: 47 y.o. [place], [name] woman status post right breast upper outer quadrant biopsy 01/11/2018 for a clinical T1 N0, stage IA invasive ductal  carcinoma, grade 3, estrogen and progesterone receptor positive, HER-2 amplified, with an MIB-1 of 30%.  (1) status post right lumpectomy and sentinel lymph node sampling 02/23/2018 for a pT2 pN1, stage IB invasive ductal carcinoma, grade 3, with close but negative margins  (2) adjuvant chemotherapy consisting of carboplatin, docetaxel, trastuzumab and Pertuzumab every 21 days x 6 to start 03/08/2018  (3) anti-HER-2 immunotherapy to start concurrently with chemotherapy  (a) baseline echocardiogram on 02/07/2018 showed an ejection fraction in the 55-60% range  (4) adjuvant radiation as appropriate  (5) antiestrogens to follow at the completion of local treatment  (6) genetics testing through [facility] Multi-cancer and Breast panel on 01/13/2018 showed: no deleterious mutations. The following genes were evaluated for sequence changes and exonic deletions/duplications: ALK, APC, ATM, AXIN2, BAP1, BARD1, BLM, BMPR1A, BRCA1, BRCA2, BRIP1, CASR, CDC73, CDH1, CDK4, CDKN1B, CDKN1C,CDKN2A (p14ARF), CDKN2A (p16INK4a), CEBPA, CHEK2, CTNNA1, DICER1, DIS3L2, EPCAM*, FH, FLCN, GATA2, GPC3, GREM1*, HRAS, KIT, MAX, MEN1, MET, MLH1, MSH2, MSH3, MSH6, MUTYH, NBN, NF1, NF2, PALB2, PDGFRA, PHOX2B*, PMS2, POLD1,POLE, POT1, PRKAR1A, PTCH1, PTEN, RAD50, RAD51C, RAD51D, RB1, RECQL4, RET, RUNX1, SDHAF2, SDHB, SDHC, SDHD,SMAD4, SMARCA4, SMARCB1, SMARCE1, STK11, SUFU, TERC, TERT, TMEM127, TP53, TSC1, TSC2, VHL, WRN*, WT1.The following genes were evaluated for sequence changes only: EGFR*, HOXB13*, MITF*, NTHL1*, SDHA  PLAN: Sharonica is not feeling well.  This diarrhea has made her miserable.  She met with myself and Dr. Jana Hakim today.  We reviewed with her how to take Imodium.  She will let us know if this doesn't work.  If not, we will prescribe Questran.  She will receive IV fluids today with potassium 61mq since her potassium is 3.3 today.  We reviewed oral intake and keeping up with her fluids.  Dr. MJana Hakimwill  eliminate Perjeta from cycles 2 and 3 of her treatments.    I also ordered IV Zofran, and sent a script of Zofran to her pharmacy.  I instructed that Zofran should not be taken 3 days after chemo administration.  She verbalized understanding of this.    I reviewed with MYanitzathat due to her  HIV and receiving chemotherapy, she really should f/u with her ID doctor so they can ensure there are no interactions with her medications.  She will call Dr. Sabra Heck and expedite an appointment.    Posey is also very uncomfortable from her hemorrhoids.  I don't see anything that looks like it needs to be lanced, however I did prescribe some Anusol HC cream for her to apply BID PRN.    Eloyse will return tomorrow for IV fluids.  She will then return in 2 weeks for labs, f/u and her next treatment.    She knows to call for any questions or concerns prior to her next appointment.    Wilber Bihari, NP  03/15/18 4:42 PM Medical Oncology and Hematology [facility] [address] [place], [place] [address] Tel. [phone]    Fax. [phone]   ADDENDUM: Cathey had a very difficult time with her first chemotherapy cycle.  Most of the problem appears to be due to the Perjeta.  We discussed how she should take Imodium, how she should take Questran, and how she should hydrate herself.  I am going to omit the Perjeta from the next 2 treatment cycles.  We will consider adding it back at a lower dose with cycle 4.  I reassured her that despite her HIV positivity we are treating for cure.  I personally saw this patient and performed a substantive portion of this encounter with the listed APP documented above.   Chauncey Cruel, MD Medical Oncology and Hematology [facility] [address] [place], [place] [address] Tel. [phone]    Fax. [phone]

## 2018-03-15 ENCOUNTER — Other Ambulatory Visit: Payer: Self-pay | Admitting: Adult Health

## 2018-03-15 ENCOUNTER — Inpatient Hospital Stay (HOSPITAL_BASED_OUTPATIENT_CLINIC_OR_DEPARTMENT_OTHER): Payer: BC Managed Care – PPO | Admitting: Adult Health

## 2018-03-15 ENCOUNTER — Encounter: Payer: Self-pay | Admitting: Adult Health

## 2018-03-15 ENCOUNTER — Inpatient Hospital Stay: Payer: BC Managed Care – PPO

## 2018-03-15 VITALS — BP 128/81 | HR 96 | Temp 98.8°F | Resp 18 | Wt 250.5 lb

## 2018-03-15 DIAGNOSIS — R110 Nausea: Secondary | ICD-10-CM

## 2018-03-15 DIAGNOSIS — C50411 Malignant neoplasm of upper-outer quadrant of right female breast: Secondary | ICD-10-CM

## 2018-03-15 DIAGNOSIS — C773 Secondary and unspecified malignant neoplasm of axilla and upper limb lymph nodes: Secondary | ICD-10-CM

## 2018-03-15 DIAGNOSIS — R197 Diarrhea, unspecified: Secondary | ICD-10-CM | POA: Diagnosis not present

## 2018-03-15 DIAGNOSIS — Z170 Estrogen receptor positive status [ER+]: Secondary | ICD-10-CM

## 2018-03-15 DIAGNOSIS — C50911 Malignant neoplasm of unspecified site of right female breast: Secondary | ICD-10-CM

## 2018-03-15 DIAGNOSIS — Z95828 Presence of other vascular implants and grafts: Secondary | ICD-10-CM

## 2018-03-15 DIAGNOSIS — Z21 Asymptomatic human immunodeficiency virus [HIV] infection status: Secondary | ICD-10-CM

## 2018-03-15 DIAGNOSIS — Z79899 Other long term (current) drug therapy: Secondary | ICD-10-CM

## 2018-03-15 LAB — CBC WITH DIFFERENTIAL (CANCER CENTER ONLY)
Basophils Absolute: 0 10*3/uL (ref 0.0–0.1)
Basophils Relative: 1 %
Eosinophils Absolute: 0 10*3/uL (ref 0.0–0.5)
Eosinophils Relative: 0 %
HCT: 38.8 % (ref 34.8–46.6)
Hemoglobin: 12.6 g/dL (ref 11.6–15.9)
Lymphocytes Relative: 59 %
Lymphs Abs: 2.2 10*3/uL (ref 0.9–3.3)
MCH: 27.5 pg (ref 25.1–34.0)
MCHC: 32.6 g/dL (ref 31.5–36.0)
MCV: 84.4 fL (ref 79.5–101.0)
Monocytes Absolute: 0.2 10*3/uL (ref 0.1–0.9)
Monocytes Relative: 7 %
Neutro Abs: 1.2 10*3/uL — ABNORMAL LOW (ref 1.5–6.5)
Neutrophils Relative %: 33 %
Platelet Count: 210 10*3/uL (ref 145–400)
RBC: 4.6 MIL/uL (ref 3.70–5.45)
RDW: 14 % (ref 11.2–14.5)
WBC Count: 3.7 10*3/uL — ABNORMAL LOW (ref 3.9–10.3)

## 2018-03-15 LAB — CMP (CANCER CENTER ONLY)
ALT: 27 U/L (ref 0–44)
AST: 17 U/L (ref 15–41)
Albumin: 3.4 g/dL — ABNORMAL LOW (ref 3.5–5.0)
Alkaline Phosphatase: 166 U/L — ABNORMAL HIGH (ref 38–126)
Anion gap: 11 (ref 5–15)
BUN: 11 mg/dL (ref 6–20)
CO2: 26 mmol/L (ref 22–32)
Calcium: 8.3 mg/dL — ABNORMAL LOW (ref 8.9–10.3)
Chloride: 96 mmol/L — ABNORMAL LOW (ref 98–111)
Creatinine: 0.8 mg/dL (ref 0.44–1.00)
GFR, Est AFR Am: >60 mL/min (ref >60)
GFR, Estimated: >60 mL/min (ref >60)
Glucose, Bld: 335 mg/dL — ABNORMAL HIGH (ref 70–99)
Potassium: 3.3 mmol/L — ABNORMAL LOW (ref 3.5–5.1)
Sodium: 133 mmol/L — ABNORMAL LOW (ref 135–145)
Total Bilirubin: 0.3 mg/dL (ref 0.3–1.2)
Total Protein: 6.9 g/dL (ref 6.5–8.1)

## 2018-03-15 MED ORDER — SODIUM CHLORIDE 0.9 % IV SOLN: Freq: Once | INTRAVENOUS | Status: DC

## 2018-03-15 MED ORDER — SODIUM CHLORIDE 0.9% FLUSH
10.0000 mL | Freq: Once | INTRAVENOUS | Status: DC
  Filled 2018-03-15: qty 10

## 2018-03-15 MED ORDER — ONDANSETRON HCL 4 MG/2ML IJ SOLN
INTRAMUSCULAR | Status: AC
  Filled 2018-03-15: qty 4

## 2018-03-15 MED ORDER — SODIUM CHLORIDE 0.9 % IV SOLN
Freq: Once | INTRAVENOUS | Status: AC
  Administered 2018-03-15: 11:00:00 via INTRAVENOUS
  Filled 2018-03-15: qty 1000

## 2018-03-15 MED ORDER — SODIUM CHLORIDE 0.9% FLUSH
10.0000 mL | INTRAVENOUS | Status: DC | PRN
  Administered 2018-03-15 (×2): 10 mL via INTRAVENOUS
  Filled 2018-03-15: qty 10

## 2018-03-15 MED ORDER — HEPARIN SOD (PORK) LOCK FLUSH 100 UNIT/ML IV SOLN
500.0000 [IU] | Freq: Once | INTRAVENOUS | Status: AC
  Administered 2018-03-15: 500 [IU] via INTRAVENOUS
  Filled 2018-03-15: qty 5

## 2018-03-15 MED ORDER — OMEPRAZOLE 40 MG PO CPDR: 40.0000 mg | DELAYED_RELEASE_CAPSULE | Freq: Every day | ORAL | 0 refills | Status: DC

## 2018-03-15 MED ORDER — ONDANSETRON HCL 8 MG PO TABS: 8.0000 mg | ORAL_TABLET | Freq: Three times a day (TID) | ORAL | 0 refills | Status: DC | PRN

## 2018-03-15 MED ORDER — ONDANSETRON HCL 4 MG/2ML IJ SOLN
8.0000 mg | Freq: Once | INTRAMUSCULAR | Status: AC
  Administered 2018-03-15: 8 mg via INTRAVENOUS

## 2018-03-15 MED ORDER — HYDROCORTISONE 2.5 % RE CREA: 1.0000 "application " | TOPICAL_CREAM | Freq: Two times a day (BID) | RECTAL | 0 refills | Status: DC

## 2018-03-15 NOTE — Patient Instructions (Signed)

## 2018-03-15 NOTE — Patient Instructions (Signed)

## 2018-03-16 ENCOUNTER — Inpatient Hospital Stay: Payer: BC Managed Care – PPO

## 2018-03-16 ENCOUNTER — Encounter: Payer: Self-pay | Admitting: Physical Therapy

## 2018-03-16 ENCOUNTER — Ambulatory Visit: Payer: BC Managed Care – PPO | Attending: General Surgery | Admitting: Physical Therapy

## 2018-03-16 ENCOUNTER — Other Ambulatory Visit: Payer: Self-pay

## 2018-03-16 ENCOUNTER — Telehealth: Payer: Self-pay | Admitting: Adult Health

## 2018-03-16 VITALS — BP 124/88 | HR 92 | Temp 98.4°F | Resp 18

## 2018-03-16 DIAGNOSIS — Z170 Estrogen receptor positive status [ER+]: Secondary | ICD-10-CM

## 2018-03-16 DIAGNOSIS — R293 Abnormal posture: Secondary | ICD-10-CM | POA: Insufficient documentation

## 2018-03-16 DIAGNOSIS — M25611 Stiffness of right shoulder, not elsewhere classified: Secondary | ICD-10-CM | POA: Insufficient documentation

## 2018-03-16 DIAGNOSIS — C50411 Malignant neoplasm of upper-outer quadrant of right female breast: Secondary | ICD-10-CM | POA: Diagnosis present

## 2018-03-16 DIAGNOSIS — Z483 Aftercare following surgery for neoplasm: Secondary | ICD-10-CM | POA: Insufficient documentation

## 2018-03-16 DIAGNOSIS — Z95828 Presence of other vascular implants and grafts: Secondary | ICD-10-CM

## 2018-03-16 MED ORDER — SODIUM CHLORIDE 0.9 % IV SOLN
INTRAVENOUS | Status: DC
  Administered 2018-03-16: 12:00:00 via INTRAVENOUS
  Filled 2018-03-16 (×2): qty 250

## 2018-03-16 MED ORDER — SODIUM CHLORIDE 0.9% FLUSH
10.0000 mL | INTRAVENOUS | Status: DC | PRN
  Administered 2018-03-16: 10 mL via INTRAVENOUS
  Filled 2018-03-16: qty 10

## 2018-03-16 NOTE — Therapy (Addendum)
[place], [name], [address] Phone: [phone]   Fax:  [phone]  Physical Therapy Treatment  Patient Details  Name: Kelly Rowe MRN: [id number] Date of Birth: June 08, 1971 Referring Provider: Dr. Stark Klein   Encounter Date: 03/16/2018  PT End of Session - 03/16/18 1008    Visit Number  2    Number of Visits  4    Date for PT Re-Evaluation  05/11/18    PT Start Time  0933    PT Stop Time  1008    PT Time Calculation (min)  35 min    Activity Tolerance  Patient limited by pain;Patient limited by fatigue    Behavior During Therapy  [facility] for tasks assessed/performed       Past Medical History:  Diagnosis Date  . ANEMIA-IRON DEFICIENCY 01/30/2010  . ANXIETY 11/27/2007  . Arthritis    both knees  . Asthma 02/25/2011  . Carbuncle and furuncle of trunk 04/16/2010  . Chlamydia infection 03/21/2008  . DIABETES MELLITUS, TYPE II 08/02/2007  . Edema 01/30/2010  . ELEVATED BLOOD PRESSURE WITHOUT DIAGNOSIS OF HYPERTENSION 08/02/2007  . Family history of breast cancer   . Family history of colon cancer   . Family history of lung cancer   . FREQUENCY, URINARY 05/19/2009  . GENITAL HERPES 03/12/2009  . HIV (human immunodeficiency virus infection) ([place]) 2009  . HIV INFECTION 03/12/2009  . HSV (herpes simplex virus) infection   . HYPERLIPIDEMIA 08/02/2007  . Hypertension   . Metrorrhagia 03/21/2008  . PONV (postoperative nausea and vomiting)   . RETENTION, URINE 05/19/2009  . Trichomonas infection 01/19/2010  . VITAMIN D DEFICIENCY 01/30/2010    Past Surgical History:  Procedure Laterality Date  . ABDOMINAL HYSTERECTOMY  07/22/2010   TAH WITH PRESERVATION OF BOTH TUBES AND OVARIES  . BREAST LUMPECTOMY WITH RADIOACTIVE SEED AND SENTINEL LYMPH NODE BIOPSY Right 02/23/2018   Procedure: BREAST LUMPECTOMY WITH RADIOACTIVE SEED AND SENTINEL LYMPH NODE BIOPSY;  Surgeon: Stark Klein, MD;  Location: [facility];  Service:  General;  Laterality: Right;  . CHOLECYSTECTOMY    . ENDOMETRIAL ABLATION  01/11/2008   HER OPTION  . ESOPHAGOGASTRODUODENOSCOPY    . MULTIPLE TOOTH EXTRACTIONS    . PORTACATH PLACEMENT Left 02/23/2018   Procedure: INSERTION PORT-A-CATH;  Surgeon: Stark Klein, MD;  Location: [employer] [facility];  Service: General;  Laterality: Left;  . TUBAL LIGATION    . WISDOM TOOTH EXTRACTION      There were no vitals filed for this visit.  Subjective Assessment - 03/16/18 0939    Subjective  Patient underwent a right lumpectomy and sentinel node biopsy on 02/23/18 with 1/2 positive axillary nodes. She is currently undergoing chemotherapy which she reports is causing many symptoms - nausea, diarrhea, hemorrhoids, dehydration, etc. Her lumpectomy incision is draining and she reports it is not healing well. She saw her surgeon on 03/10/18 who told her it was looking ok but drained it at that time.    Pertinent History  Patient was diagnosed on 01/05/18 with right triple positive breast cancer. The Ki67 is 30%. Medical problems include diabetes and HIV. Patient underwent a right lumpcetomy and sentinel node biopsy on 02/23/18 with 1/2 positive axillary nodes. Currently undergoing chemotherapy.    Patient Stated Goals  Get arm back to normal    Currently in Pain?  Yes    Pain Score  8     Pain Location  Axilla    Pain Orientation  Right  Pain Descriptors / Indicators  Sharp;Stabbing    Pain Type  Surgical pain    Pain Onset  1 to 4 weeks ago    Pain Frequency  Constant    Aggravating Factors   Nothing    Pain Relieving Factors  Nothing         [facility] PT Assessment - 03/16/18 0001      Assessment   Medical Diagnosis  s/p right lumpectomy and SLNB    Referring Provider  Dr. Stark Klein    Onset Date/Surgical Date  02/23/18    Hand Dominance  Right    Prior Therapy  Baselines      Precautions   Precautions  Other (comment)    Precaution Comments  undergoing chemo; right arm lymphedema risk      Restrictions    Weight Bearing Restrictions  No      Balance Screen   Has the patient fallen in the past 6 months  No    Has the patient had a decrease in activity level because of a fear of falling?   No    Is the patient reluctant to leave their home because of a fear of falling?   No      Home Environment   Living Environment  Private residence    Living Arrangements  Children   2 adult sons and 52 y.o. 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 Otherwise no sign of infection.      Posture/Postural Control   Posture/Postural Control  Postural limitations    Postural Limitations  Rounded Shoulders;Forward head      AROM   AROM Assessment Site  Shoulder    Right/Left Shoulder  Right    Right Shoulder Extension  30 Degrees    Right Shoulder Flexion  120 Degrees    Right Shoulder ABduction  108 Degrees    Right Shoulder Internal Rotation  47 Degrees    Right Shoulder External Rotation  87 Degrees        LYMPHEDEMA/ONCOLOGY QUESTIONNAIRE - 03/16/18 0946      Type   Cancer Type  Right breast      Surgeries   Lumpectomy Date  02/23/18    Sentinel Lymph Node Biopsy Date  02/23/18    Number Lymph Nodes Removed  2      Treatment   Active Chemotherapy Treatment  Yes    Date  03/08/18    Past Chemotherapy Treatment  No    Active Radiation Treatment  No    Past Radiation Treatment  No    Current Hormone Treatment  No    Past Hormone Therapy  No      What other symptoms do you have   Are you Having Heaviness or Tightness  Yes    Are you having Pain  Yes    Are you having  pitting edema  No    Is it Hard or Difficult finding clothes that fit  No  Do you have infections  No    Is there Decreased scar mobility  Yes    Stemmer Sign  No      Right Upper Extremity Lymphedema   10 cm Proximal to Olecranon Process  37.9 cm    Olecranon Process  27.8 cm    10 cm Proximal to Ulnar Styloid Process  26.4 cm    Just Proximal to Ulnar Styloid Process  17.3 cm    Across Hand at [employer]  20.1 cm    At [place] of 2nd Digit  6.5 cm      Left Upper Extremity Lymphedema   10 cm Proximal to Olecranon Process  36.2 cm    Olecranon Process  27 cm    10 cm Proximal to Ulnar Styloid Process  25.3 cm    Just Proximal to Ulnar Styloid Process  16.6 cm    Across Hand at [employer]  19.2 cm    At [place] of 2nd Digit  6.3 cm        Quick Dash - 03/16/18 0001    Open a tight or new jar  Moderate difficulty    Do heavy household chores (wash walls, wash floors)  Mild difficulty    Carry a shopping bag or briefcase  No difficulty    Wash your back  Mild difficulty    Use a knife to cut food  No difficulty    Recreational activities in which you take some force or impact through your arm, shoulder, or hand (golf, hammering, tennis)  Mild difficulty    During the past week, to what extent has your arm, shoulder or hand problem interfered with your normal social activities with family, friends, neighbors, or groups?  Slightly    During the past week, to what extent has your arm, shoulder or hand problem limited your work or other regular daily activities  Slightly    Arm, shoulder, or hand pain.  Severe    Tingling (pins and needles) in your arm, shoulder, or hand  Severe    Difficulty Sleeping  Moderate difficulty    DASH Score  34.09 %                     PT Education - 03/16/18 1007    Education Details  reviewed HEP given pre-operatively    Person(s) Educated  Patient    Methods  Explanation;Demonstration;Handout    Comprehension  Returned  demonstration;Verbalized understanding          PT Long Term Goals - 03/16/18 1014      PT LONG TERM GOAL #1   Title  Patient will demonstrate she has returned ot baseline related to shoulder ROM and function.    Time  8    Period  Weeks    Status  On-going            Plan - 03/16/18 1008    Clinical Impression Statement  Patient is struggling today with feeling bad from chemo. Her biggest complaint is hemorrhoids causing her pain due to diarrhea from a chemo side effect. She also reports right axillary incision drainage and pain in her axilla. She was encouraged to begin hre ROM HEP to improve arm mobility which will likely reduce pain she is having in that area. An ABD pad was applied to her axillary region which she reported felt better and she was encouraged to purchase a compression bra to give support to her lateral  proximal trunk in that area. She is not up for beginning PT ofr shoulder ROM at this time but agreed to return to PT in 1 month to reassess and determine if ROM has returned to baseline. If she continues to be limited at that time, she agreed to begin PT.    Rehab Potential  Good    PT Frequency  Mon[place]    PT Duration  --   2 months   PT Treatment/Interventions  ADLs/Self Care Home Management;Therapeutic exercise;Patient/family education;Passive range of motion;Therapeutic activities;Manual techniques;Scar mobilization    PT Next Visit Plan  Reassess ROM in 1 month; if she is not back to baseline, begin PT    PT Home Exercise Plan  Post op shoulder ROM HEP    Consulted and Agree with Plan of Care  Patient       Patient will benefit from skilled therapeutic intervention in order to improve the following deficits and impairments:  Decreased knowledge of precautions, Impaired UE functional use, Decreased range of motion, Postural dysfunction, Pain  Visit Diagnosis: Malignant neoplasm of upper-outer quadrant of right breast in female, estrogen receptor positive  ([place]) - Plan: PT plan of care cert/re-cert  Abnormal posture - Plan: PT plan of care cert/re-cert  Aftercare following surgery for neoplasm - Plan: PT plan of care cert/re-cert  Stiffness of right shoulder, not elsewhere classified - Plan: PT plan of care cert/re-cert     Problem List Patient Active Problem List   Diagnosis Date Noted  . Genetic testing 01/30/2018  . Neoplasm of breast, regional lymph node staging category N3b: metastasis in ipsilateral internal mammary lymph node and axillary lymph node ([place]) 01/30/2018  . Family history of breast cancer   . Family history of colon cancer   . Family history of lung cancer   . Diabetes mellitus type 2 without retinopathy ([place]) 01/18/2018  . Morbid obesity ([place]) 01/18/2018  . Malignant neoplasm of upper-outer quadrant of right breast in female, estrogen receptor positive ([place]) 01/17/2018  . Achilles tendinitis 11/28/2017  . Hot flashes 09/30/2017  . Hypertension 09/08/2017  . Contusion of left knee and lower leg 11/18/2015  . Mild intermittent asthma 08/15/2013  . BV (bacterial vaginosis) 08/11/2012  . Right knee pain 07/05/2012  . GERD (gastroesophageal reflux disease) 07/05/2012  . GC (gonococcus) 05/30/2012  . Chlamydia 05/30/2012  . Left knee pain 01/12/2012  . Cough 11/01/2011  . Encounter for long-term (current) use of high-risk medication 02/25/2011  . Preventative health care 02/14/2011  . VITAMIN D DEFICIENCY 01/30/2010  . ANEMIA-IRON DEFICIENCY 01/30/2010  . Edema 01/30/2010  . HIV (human immunodeficiency virus infection) ([place]) 03/12/2009  . GENITAL HERPES 03/12/2009  . Anxiety state 11/27/2007  . Diabetes ([place]) 08/02/2007  . Hyperlipidemia 08/02/2007   Annia Friendly, PT 03/16/18 10:15 AM  [place], [name], [address] Phone: [phone]   Fax:  [phone]  Name: LYNNSIE LINDERS MRN: [id number] Date of Birth:  Nov 26, 1970  PHYSICAL THERAPY DISCHARGE SUMMARY  Visits from Start of Care: 2  Current functional level related to goals / functional outcomes: Unknown. Patient had a f/u appointment scheduled for the end of September and she no showed. In looking at her chart review, she may not feel well enough to attend therapy right now.   Remaining deficits: Unknown   Education / Equipment: HEP and lymphedema education. Plan: Patient agrees to discharge.  Patient goals were partially met. Patient is being discharged due to not returning  since the last visit.  ?????         Annia Friendly, [place] 05/01/18 4:05 PM

## 2018-03-16 NOTE — Telephone Encounter (Signed)
Patient came in today for IVF's. Per patient LC told her yesterday she needed to come back today for fluids. Patient was sent straight to infusion after f/u visit yesterday and did not get to return to scheduling. No room to add on in infusion today - will be given on exam side. Patient/LC aware. Patient will proceed to [facility] for therapy and return to [facility] after for IVF's. Appointment added. No other orders per 8/21 los.

## 2018-03-16 NOTE — Patient Instructions (Signed)

## 2018-03-17 NOTE — Progress Notes (Signed)
TC to let her know am thinking of her and hope things get better.

## 2018-03-28 ENCOUNTER — Other Ambulatory Visit: Payer: Self-pay

## 2018-03-28 DIAGNOSIS — C50411 Malignant neoplasm of upper-outer quadrant of right female breast: Secondary | ICD-10-CM

## 2018-03-28 DIAGNOSIS — Z170 Estrogen receptor positive status [ER+]: Secondary | ICD-10-CM

## 2018-03-29 ENCOUNTER — Other Ambulatory Visit: Payer: Self-pay | Admitting: Oncology

## 2018-03-29 ENCOUNTER — Inpatient Hospital Stay: Payer: BC Managed Care – PPO | Attending: Oncology

## 2018-03-29 ENCOUNTER — Inpatient Hospital Stay: Payer: BC Managed Care – PPO

## 2018-03-29 ENCOUNTER — Encounter: Payer: Self-pay | Admitting: Adult Health

## 2018-03-29 ENCOUNTER — Inpatient Hospital Stay (HOSPITAL_BASED_OUTPATIENT_CLINIC_OR_DEPARTMENT_OTHER): Payer: BC Managed Care – PPO | Admitting: Adult Health

## 2018-03-29 VITALS — BP 119/71 | HR 85 | Temp 98.0°F | Resp 18 | Ht 62.0 in | Wt 252.0 lb

## 2018-03-29 DIAGNOSIS — E1165 Type 2 diabetes mellitus with hyperglycemia: Secondary | ICD-10-CM | POA: Insufficient documentation

## 2018-03-29 DIAGNOSIS — R739 Hyperglycemia, unspecified: Secondary | ICD-10-CM

## 2018-03-29 DIAGNOSIS — Z170 Estrogen receptor positive status [ER+]: Secondary | ICD-10-CM

## 2018-03-29 DIAGNOSIS — Z79899 Other long term (current) drug therapy: Secondary | ICD-10-CM | POA: Diagnosis not present

## 2018-03-29 DIAGNOSIS — C50911 Malignant neoplasm of unspecified site of right female breast: Secondary | ICD-10-CM

## 2018-03-29 DIAGNOSIS — C50411 Malignant neoplasm of upper-outer quadrant of right female breast: Secondary | ICD-10-CM

## 2018-03-29 DIAGNOSIS — Z95828 Presence of other vascular implants and grafts: Secondary | ICD-10-CM

## 2018-03-29 DIAGNOSIS — Z7982 Long term (current) use of aspirin: Secondary | ICD-10-CM | POA: Insufficient documentation

## 2018-03-29 DIAGNOSIS — E876 Hypokalemia: Secondary | ICD-10-CM | POA: Diagnosis not present

## 2018-03-29 DIAGNOSIS — R110 Nausea: Secondary | ICD-10-CM | POA: Diagnosis not present

## 2018-03-29 DIAGNOSIS — R197 Diarrhea, unspecified: Secondary | ICD-10-CM | POA: Insufficient documentation

## 2018-03-29 DIAGNOSIS — Z5189 Encounter for other specified aftercare: Secondary | ICD-10-CM | POA: Diagnosis not present

## 2018-03-29 DIAGNOSIS — B20 Human immunodeficiency virus [HIV] disease: Secondary | ICD-10-CM | POA: Diagnosis not present

## 2018-03-29 DIAGNOSIS — Z5112 Encounter for antineoplastic immunotherapy: Secondary | ICD-10-CM | POA: Insufficient documentation

## 2018-03-29 DIAGNOSIS — C773 Secondary and unspecified malignant neoplasm of axilla and upper limb lymph nodes: Secondary | ICD-10-CM

## 2018-03-29 DIAGNOSIS — E860 Dehydration: Secondary | ICD-10-CM | POA: Insufficient documentation

## 2018-03-29 DIAGNOSIS — Z21 Asymptomatic human immunodeficiency virus [HIV] infection status: Secondary | ICD-10-CM

## 2018-03-29 LAB — CMP (CANCER CENTER ONLY)
ALT: 37 U/L (ref 0–44)
AST: 21 U/L (ref 15–41)
Albumin: 3.2 g/dL — ABNORMAL LOW (ref 3.5–5.0)
Alkaline Phosphatase: 165 U/L — ABNORMAL HIGH (ref 38–126)
Anion gap: 12 (ref 5–15)
BUN: 7 mg/dL (ref 6–20)
CO2: 26 mmol/L (ref 22–32)
Calcium: 8.7 mg/dL — ABNORMAL LOW (ref 8.9–10.3)
Chloride: 99 mmol/L (ref 98–111)
Creatinine: 0.81 mg/dL (ref 0.44–1.00)
GFR, Est AFR Am: >60 mL/min (ref >60)
GFR, Estimated: >60 mL/min (ref >60)
Glucose, Bld: 412 mg/dL — ABNORMAL HIGH (ref 70–99)
Potassium: 3.5 mmol/L (ref 3.5–5.1)
Sodium: 137 mmol/L (ref 135–145)
Total Bilirubin: 0.2 mg/dL — ABNORMAL LOW (ref 0.3–1.2)
Total Protein: 6.5 g/dL (ref 6.5–8.1)

## 2018-03-29 LAB — CBC WITH DIFFERENTIAL (CANCER CENTER ONLY)
Basophils Absolute: 0 10*3/uL (ref 0.0–0.1)
Basophils Relative: 0 %
Eosinophils Absolute: 0 10*3/uL (ref 0.0–0.5)
Eosinophils Relative: 0 %
HCT: 35.6 % (ref 34.8–46.6)
Hemoglobin: 11.4 g/dL — ABNORMAL LOW (ref 11.6–15.9)
Lymphocytes Relative: 46 %
Lymphs Abs: 2.2 10*3/uL (ref 0.9–3.3)
MCH: 27.3 pg (ref 25.1–34.0)
MCHC: 32 g/dL (ref 31.5–36.0)
MCV: 85.4 fL (ref 79.5–101.0)
Monocytes Absolute: 0.4 10*3/uL (ref 0.1–0.9)
Monocytes Relative: 9 %
Neutro Abs: 2.2 10*3/uL (ref 1.5–6.5)
Neutrophils Relative %: 45 %
Platelet Count: 172 10*3/uL (ref 145–400)
RBC: 4.17 MIL/uL (ref 3.70–5.45)
RDW: 14.7 % — ABNORMAL HIGH (ref 11.2–14.5)
WBC Count: 4.8 10*3/uL (ref 3.9–10.3)

## 2018-03-29 MED ORDER — DOXYCYCLINE HYCLATE 100 MG PO TABS: 100.0000 mg | ORAL_TABLET | Freq: Two times a day (BID) | ORAL | 0 refills | Status: DC

## 2018-03-29 MED ORDER — SODIUM CHLORIDE 0.9% FLUSH
10.0000 mL | INTRAVENOUS | Status: DC | PRN
  Administered 2018-03-29: 10 mL
  Filled 2018-03-29: qty 10

## 2018-03-29 MED ORDER — HEPARIN SOD (PORK) LOCK FLUSH 100 UNIT/ML IV SOLN
500.0000 [IU] | Freq: Once | INTRAVENOUS | Status: AC | PRN
  Administered 2018-03-29: 500 [IU]
  Filled 2018-03-29: qty 5

## 2018-03-29 MED ORDER — SODIUM CHLORIDE 0.9 % IV SOLN
Freq: Once | INTRAVENOUS | Status: AC
  Administered 2018-03-29: 09:00:00 via INTRAVENOUS
  Filled 2018-03-29: qty 250

## 2018-03-29 MED ORDER — SODIUM CHLORIDE 0.9 % IV SOLN
750.0000 mg | Freq: Once | INTRAVENOUS | Status: AC
  Administered 2018-03-29: 750 mg via INTRAVENOUS
  Filled 2018-03-29: qty 75

## 2018-03-29 MED ORDER — DIPHENHYDRAMINE HCL 25 MG PO CAPS
ORAL_CAPSULE | ORAL | Status: AC
  Filled 2018-03-29: qty 1

## 2018-03-29 MED ORDER — INSULIN REGULAR HUMAN 100 UNIT/ML IJ SOLN
10.0000 [IU] | Freq: Once | INTRAMUSCULAR | Status: AC
  Administered 2018-03-29: 10 [IU] via SUBCUTANEOUS
  Filled 2018-03-29: qty 0.1

## 2018-03-29 MED ORDER — TRASTUZUMAB CHEMO 150 MG IV SOLR
6.0000 mg/kg | Freq: Once | INTRAVENOUS | Status: AC
  Administered 2018-03-29: 714 mg via INTRAVENOUS
  Filled 2018-03-29: qty 34

## 2018-03-29 MED ORDER — SODIUM CHLORIDE 0.9% FLUSH
10.0000 mL | Freq: Once | INTRAVENOUS | Status: AC
  Administered 2018-03-29: 10 mL
  Filled 2018-03-29: qty 10

## 2018-03-29 MED ORDER — ACETAMINOPHEN 325 MG PO TABS
650.0000 mg | ORAL_TABLET | Freq: Once | ORAL | Status: AC
  Administered 2018-03-29: 650 mg via ORAL

## 2018-03-29 MED ORDER — ACETAMINOPHEN 325 MG PO TABS
ORAL_TABLET | ORAL | Status: AC
  Filled 2018-03-29: qty 2

## 2018-03-29 MED ORDER — SODIUM CHLORIDE 0.9 % IV SOLN
75.0000 mg/m2 | Freq: Once | INTRAVENOUS | Status: AC
  Administered 2018-03-29: 170 mg via INTRAVENOUS
  Filled 2018-03-29: qty 17

## 2018-03-29 MED ORDER — SODIUM CHLORIDE 0.9 % IV SOLN
Freq: Once | INTRAVENOUS | Status: AC
  Administered 2018-03-29: 10:00:00 via INTRAVENOUS
  Filled 2018-03-29: qty 5

## 2018-03-29 MED ORDER — PALONOSETRON HCL INJECTION 0.25 MG/5ML
INTRAVENOUS | Status: AC
  Filled 2018-03-29: qty 5

## 2018-03-29 MED ORDER — DIPHENHYDRAMINE HCL 25 MG PO CAPS
25.0000 mg | ORAL_CAPSULE | Freq: Once | ORAL | Status: AC
  Administered 2018-03-29: 25 mg via ORAL

## 2018-03-29 MED ORDER — PALONOSETRON HCL INJECTION 0.25 MG/5ML
0.2500 mg | Freq: Once | INTRAVENOUS | Status: AC
  Administered 2018-03-29: 0.25 mg via INTRAVENOUS

## 2018-03-29 NOTE — Patient Instructions (Signed)
[facility] Discharge Instructions for Patients Receiving Chemotherapy  Today you received the following chemotherapy agents:  Herceptin, Taxotere, Carboplatin  To help prevent nausea and vomiting after your treatment, we encourage you to take your nausea medication as prescribed.   If you develop nausea and vomiting that is not controlled by your nausea medication, call the clinic.   BELOW ARE SYMPTOMS THAT SHOULD BE REPORTED IMMEDIATELY:  *FEVER GREATER THAN 100.5 F  *CHILLS WITH OR WITHOUT FEVER  NAUSEA AND VOMITING THAT IS NOT CONTROLLED WITH YOUR NAUSEA MEDICATION  *UNUSUAL SHORTNESS OF BREATH  *UNUSUAL BRUISING OR BLEEDING  TENDERNESS IN MOUTH AND THROAT WITH OR WITHOUT PRESENCE OF ULCERS  *URINARY PROBLEMS  *BOWEL PROBLEMS  UNUSUAL RASH Items with * indicate a potential emergency and should be followed up as soon as possible.  Feel free to call the clinic should you have any questions or concerns. The clinic phone number is [phone].  Please show the CHEMO ALERT CARD at check-in to the Emergency Department and triage nurse.   

## 2018-03-29 NOTE — Progress Notes (Signed)
[phone]

## 2018-03-29 NOTE — Progress Notes (Signed)
Per Mendel Ryder NP Pt to receive insulin in tx area, pt to stop taking decadron. Pt verbalized understanding. Ok to tx today.

## 2018-03-29 NOTE — Progress Notes (Addendum)
[place]  [phone] [phone]     ID: CHESNI VOS DOB: 01-Dec-1970  MR#: [id number]  ONG#:[id number]  Patient Care Team: Biagio Borg, MD as PCP - [name], MD as Consulting Physician (General Surgery) [name], [name], MD as Consulting Physician (Oncology) [name], MD as Attending Physician (Radiation Oncology) [name], NP as Nurse Practitioner (Obstetrics and Gynecology) Marlinda Mike, PA-C as Referring Physician (Physician Assistant) OTHER MD:   CHIEF COMPLAINT: Triple positive breast cancer  CURRENT TREATMENT: adjuvant chemotherapy   HISTORY OF CURRENT ILLNESS: From the original intake note:  "Kelly Rowe" noticed bruising in her lateral right breast and palpated a mass  on 12/23/2017. She followed up with her gynecologist. She underwent unilateral right diagnostic mammography with tomography and right breast ultrasonography at [facility] on 01/05/2018 showing: breast density category B. There is an irregular highly suspicious mass within the right breast at the 9:30 o'clock upper outer quadrant, measuring 1.8 x 1.1 x 1.5 cm, and located 18 cm from the nipple,  Ultrasonography revealed a single morphologically abnormal lymph node in the RIGHT axilla, with cortical thickness of 6 mm.   Accordingly on 01/11/2018 she proceeded to biopsy of the right breast area in question as well as a suspicious lymph node. The pathology from this procedure showed ([id number]): Invasive ductal carcinoma grade III.  The lymph node biopsied was negative for carcinoma (concordant).. Prognostic indicators significant for: estrogen receptor, 60% positive with weak staining intensity and progesterone receptor, 10% positive with strong staining intensity. Proliferation marker Ki67 at 30%. HER2 amplified with ratios HER2/CEP17 signals 2.26 and average HER2 copies per cell 3.50  The patient's subsequent history is as detailed  below.  INTERVAL HISTORY: Kelly Rowe returns today for follow up and treatment of her triple positive breast cancer accompanied by her mother. She is receiving neoadjuvant chemotherapy with Docetaxel, Carboplatin, Trastuzumab, Pertuzumab given on day 1 of a 21 day cycle.  She receives growth factor support on day 3 with Udenyca.  Today is cycle 2 day 1 of treatment.  Pertuzumab is omitted with this cycle due to diarrhea.  REVIEW OF SYSTEMS: Kelly Rowe is feeling better today.  She says that her diarrhea has resolved and her hemorrhoids have improved.  She notes her stool is now soft.  Her hemorrhoids have improved.  She utilized a modified [employer], minus the applesauce.  She is experiencing leaking under her breast.  She notes her right breast is enlarged and is draining on its own.  She has been seeing Dr. Barry Dienes for draining of this area.  Skin is otherwise intact.  She denies any fevers or chills.  Kelly Rowe remains slightly nauseated.  She has had some cramping in her feet, but denies any numbness or tingling in the tips of her fingers or toes.  Otherwise Kelly Rowe is doing well and a detailed ROS is non contributory.     PAST MEDICAL HISTORY: Past Medical History:  Diagnosis Date  . ANEMIA-IRON DEFICIENCY 01/30/2010  . ANXIETY 11/27/2007  . Arthritis    both knees  . Asthma 02/25/2011  . Carbuncle and furuncle of trunk 04/16/2010  . Chlamydia infection 03/21/2008  . DIABETES MELLITUS, TYPE II 08/02/2007  . Edema 01/30/2010  . ELEVATED BLOOD PRESSURE WITHOUT DIAGNOSIS OF HYPERTENSION 08/02/2007  . Family history of breast cancer   . Family history of colon cancer   . Family history of lung cancer   . FREQUENCY, URINARY 05/19/2009  . GENITAL HERPES 03/12/2009  .  HIV (human immunodeficiency virus infection) ([place]) 2009  . HIV INFECTION 03/12/2009  . HSV (herpes simplex virus) infection   . HYPERLIPIDEMIA 08/02/2007  . Hypertension   . Metrorrhagia 03/21/2008  . PONV (postoperative nausea and vomiting)    . RETENTION, URINE 05/19/2009  . Trichomonas infection 01/19/2010  . VITAMIN D DEFICIENCY 01/30/2010    PAST SURGICAL HISTORY: Past Surgical History:  Procedure Laterality Date  . ABDOMINAL HYSTERECTOMY  07/22/2010   TAH WITH PRESERVATION OF BOTH TUBES AND OVARIES  . BREAST LUMPECTOMY WITH RADIOACTIVE SEED AND SENTINEL LYMPH NODE BIOPSY Right 02/23/2018   Procedure: BREAST LUMPECTOMY WITH RADIOACTIVE SEED AND SENTINEL LYMPH NODE BIOPSY;  Surgeon: Stark Klein, MD;  Location: [place];  Service: General;  Laterality: Right;  . CHOLECYSTECTOMY    . ENDOMETRIAL ABLATION  01/11/2008   HER OPTION  . ESOPHAGOGASTRODUODENOSCOPY    . MULTIPLE TOOTH EXTRACTIONS    . PORTACATH PLACEMENT Left 02/23/2018   Procedure: INSERTION PORT-A-CATH;  Surgeon: Stark Klein, MD;  Location: [place];  Service: General;  Laterality: Left;  . TUBAL LIGATION    . WISDOM TOOTH EXTRACTION      FAMILY HISTORY Family History  Problem Relation Age of Onset  . Prostate cancer Other   . Heart disease Other   . Stroke Other   . Diabetes Mother   . Hypertension Mother   . Diabetes Father   . Cancer Father        COLON and LU NG  . Stroke Paternal Uncle   . Breast cancer Paternal Aunt   . Lung cancer Maternal Grandmother 34       Mesothelioma  . Colon cancer Paternal Grandfather        dx over 49s  . Lung cancer Paternal Aunt   . Breast cancer Paternal Aunt   . Breast cancer Paternal Aunt   . Heart attack Maternal Grandfather 71  . Colon cancer Other        MGM's 5 brothers  The patient's father died at age 39 due to metastatic liver cancer. The patient's mother is alive at 37. The patient's has 1 brother and 3 sisters. There was a paternal grandfather with colon cancer. There were 3 paternal aunts with breast cancer, 2 diagnosed in the 15's and 1 at age 60. There was a maternal grandmother with mesothelioma. The patient otherwise denies a history of ovarian cancer in the family.    GYNECOLOGIC HISTORY:   Patient's last menstrual period was 06/21/2010. Menarche: 47 years old Age at first live birth: 47 years old She is GXP5. Her LMP was December 2011. She is status post partial hysterectomy without oophorectomy. She took oral contraception for 3 years with no complications. She never took HRT.    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  At home are 2 of her sons, Glendell Docker and [name], and the patient's grandson, Amador Cunas 75 (who is Willie's son). The patient's oldest is [place] age 28 who lives in [place] as a cook. Daughter, [place] age 41 works as a Scientist, water quality. Son, Glendell Docker age 56 is disabled. Son, [name] age 68 lives in [place] as a Microbiologist. Daughter, Benard Rink age 32 also is a Microbiologist. The patient has 5 grandchildren and no great grandchildren. The patient does not belong to a church.        ADVANCED DIRECTIVES: Not in place; at the 01/18/2018 visit the patient was given the appropriate documents to complete on notarized at her discretion   HEALTH MAINTENANCE: Social History  Tobacco Use  . Smoking status: Never Smoker  . Smokeless tobacco: Never Used  Substance Use Topics  . Alcohol use: Yes    Alcohol/week: 0.0 standard drinks    Comment: OCCASIONALLY  . Drug use: No     Colonoscopy: Not yet  PAP: November 2018  Bone density: Never   Allergies  Allergen Reactions  . Levaquin [Levofloxacin In D5w] Nausea And Vomiting and Rash  . Penicillins Swelling and Rash    Has patient had a PCN reaction causing immediate rash, facial/tongue/throat swelling, SOB or lightheadedness with hypotension: Yes Has patient had a PCN reaction causing severe rash involving mucus membranes or skin necrosis: No Has patient had a PCN reaction that required hospitalization: Yes Has patient had a PCN reaction occurring within the last 10 years: No If all of the above answers are "NO", then may proceed with Cephalosporin use.    Current Outpatient Medications  Medication Sig Dispense Refill   . albuterol (PROVENTIL HFA;VENTOLIN HFA) 108 (90 Base) MCG/ACT inhaler Inhale 2 puffs into the lungs every 6 (six) hours as needed for wheezing or shortness of breath.    Marland Kitchen aspirin 81 MG tablet Take 81 mg by mouth daily as needed (chest pain).     . Azelastine-Fluticasone (DYMISTA) 137-50 MCG/ACT SUSP Use as directed 1 spray each side twice per day as needed (Patient taking differently: Place 1 spray into both nostrils 2 (two) times daily as needed (alleriges). ) 23 g 5  . Beclomethasone Dipropionate (QNASL) 80 MCG/ACT AERS Place 2 sprays into the nose daily. (Patient taking differently: Place 2 sprays into the nose daily as needed (congestion). ) 1 Inhaler 1  . blood glucose meter kit and supplies Dispense based on patient and insurance preference. Use up to four times daily as directed. (FOR ICD-10 E10.9, E11.9). 1 each 0  . Cholecalciferol (VITAMIN D) 2000 units tablet Take 2,000 Units by mouth daily.    Marland Kitchen dexamethasone (DECADRON) 4 MG tablet Take 2 tablets (8 mg total) by mouth 2 (two) times daily. Start the day before Taxotere. Take once the day after, then 2 times a day x 2d. 30 tablet 1  . Diclofenac Sodium (PENNSAID) 2 % SOLN Place 1 application onto the skin 2 (two) times daily. (Patient taking differently: Place 1 application onto the skin 2 (two) times daily as needed (pain). ) 1 Bottle 3  . diphenhydrAMINE HCl (ZZZQUIL) 50 MG/30ML LIQD Take 50 mg by mouth at bedtime as needed (sleep).    . Dulaglutide (TRULICITY) 4.94 WH/6.7RF SOPN Inject 0.75 mg into the skin once a week. 6 mL 11  . elvitegravir-cobicistat-emtricitabine-tenofovir (STRIBILD) 150-150-200-300 MG TABS tablet Take 1 tablet daily by mouth.    . famotidine (PEPCID) 20 MG tablet Take 1 tablet (20 mg total) by mouth 2 (two) times daily. 60 tablet 3  . ferrous gluconate (IRON 27) 240 (27 FE) MG tablet Take 480 mg by mouth daily.    Marland Kitchen glipiZIDE (GLIPIZIDE XL) 5 MG 24 hr tablet Take 1 tablet (5 mg total) by mouth daily with  breakfast. 90 tablet 3  . hydrocortisone (ANUSOL-HC) 2.5 % rectal cream Place 1 application rectally 2 (two) times daily. 30 g 0  . ibuprofen (ADVIL,MOTRIN) 800 MG tablet Take 1 tablet (800 mg total) by mouth every 8 (eight) hours as needed. (Patient taking differently: Take 800 mg by mouth daily as needed for moderate pain. ) 60 tablet 0  . lidocaine-prilocaine (EMLA) cream Apply to affected area once 30 g 3  .  LORazepam (ATIVAN) 0.5 MG tablet Take 1 tablet (0.5 mg total) by mouth at bedtime as needed (Nausea or vomiting). 30 tablet 0  . metFORMIN (GLUCOPHAGE-XR) 500 MG 24 hr tablet Take 2 tablets (1,000 mg total) by mouth daily with breakfast. Annual appt is due must see provider for future refills 180 tablet 3  . Multiple Vitamin (MULTIVITAMIN WITH MINERALS) TABS tablet Take 1 tablet by mouth daily.    Marland Kitchen omeprazole (PRILOSEC) 40 MG capsule Take 1 capsule (40 mg total) by mouth daily. 30 capsule 0  . ondansetron (ZOFRAN) 8 MG tablet Take 1 tablet (8 mg total) by mouth every 8 (eight) hours as needed for nausea or vomiting. 20 tablet 0  . oxyCODONE (OXY IR/ROXICODONE) 5 MG immediate release tablet Take 1-2 tablets (5-10 mg total) by mouth every 6 (six) hours as needed for moderate pain, severe pain or breakthrough pain. 30 tablet 0  . polyethylene glycol (MIRALAX / GLYCOLAX) packet Take 17 g by mouth daily as needed for moderate constipation.    . prochlorperazine (COMPAZINE) 10 MG tablet Take 1 tablet (10 mg total) by mouth every 6 (six) hours as needed (Nausea or vomiting). 30 tablet 1  . rosuvastatin (CRESTOR) 40 MG tablet Take 40 mg by mouth every evening.    . traMADol (ULTRAM) 50 MG tablet Take 50 mg by mouth daily as needed for moderate pain.    . valACYclovir (VALTREX) 500 MG tablet Take twice daily for 3-5 days (Patient taking differently: Take 500 mg by mouth 2 (two) times daily as needed (fever blisters). Take for 3-5 days) 30 tablet 12  . zolpidem (AMBIEN) 10 MG tablet Take 10 mg by mouth  at bedtime as needed for sleep.     No current facility-administered medications for this visit.     OBJECTIVE: Vitals:   03/29/18 0820  BP: 119/71  Pulse: 85  Resp: 18  Temp: 98 F (36.7 C)  SpO2: 100%     Body mass index is 46.09 kg/m.   Wt Readings from Last 3 Encounters:  03/29/18 252 lb (114.3 kg)  03/15/18 250 lb 8 oz (113.6 kg)  03/03/18 262 lb 12.8 oz (119.2 kg)  ECOG FS:2 GENERAL: Patient is a well appearing female in no acute distress HEENT:  Sclerae anicteric.  Oropharynx clear and moist. No ulcerations or evidence of oropharyngeal candidiasis. Neck is supple.  NODES:  No cervical, supraclavicular, or axillary lymphadenopathy palpated.  BREAST EXAM:  See picture below, serous drainage noted on abd pad LUNGS:  Clear to auscultation bilaterally.  No wheezes or rhonchi. HEART:  Regular rate and rhythm. No murmur appreciated. ABDOMEN:  Soft, nontender.  Positive, normoactive bowel sounds. No organomegaly palpated. MSK:  No focal spinal tenderness to palpation. Full range of motion bilaterally in the upper extremities. EXTREMITIES:  No peripheral edema.   SKIN:  Clear with no obvious rashes or skin changes. No nail dyscrasia.  NEURO:  Nonfocal. Well oriented.  Appropriate affect.  Right axillary area on 03/29/18   LAB RESULTS:  CMP     Component Value Date/Time   NA 133 (L) 03/15/2018 0838   K 3.3 (L) 03/15/2018 0838   CL 96 (L) 03/15/2018 0838   CO2 26 03/15/2018 0838   GLUCOSE 335 (H) 03/15/2018 0838   BUN 11 03/15/2018 0838   CREATININE 0.80 03/15/2018 0838   CALCIUM 8.3 (L) 03/15/2018 0838   PROT 6.9 03/15/2018 0838   ALBUMIN 3.4 (L) 03/15/2018 0838   AST 17 03/15/2018 4970  ALT 27 03/15/2018 0838   ALKPHOS 166 (H) 03/15/2018 0838   BILITOT 0.3 03/15/2018 0838   GFRNONAA >60 03/15/2018 0838   GFRAA >60 03/15/2018 0838    No results found for: TOTALPROTELP, ALBUMINELP, A1GS, A2GS, BETS, BETA2SER, GAMS, MSPIKE, SPEI  No results found for:  KPAFRELGTCHN, LAMBDASER, [facility]  Lab Results  Component Value Date   WBC 4.8 03/29/2018   NEUTROABS 2.2 03/29/2018   HGB 11.4 (L) 03/29/2018   HCT 35.6 03/29/2018   MCV 85.4 03/29/2018   PLT 172 03/29/2018    _0 @  No results found for: LABCA2  No components found for: [id number]  No results for input(s): INR in the last 168 hours.  No results found for: LABCA2  No results found for: MWN027  No results found for: OZD664  No results found for: QIH474  No results found for: CA2729  No components found for: HGQUANT  No results found for: CEA1 / No results found for: CEA1   No results found for: AFPTUMOR  No results found for: CHROMOGRNA  No results found for: PSA1  Appointment on 03/29/2018  Component Date Value Ref Range Status  . WBC Count 03/29/2018 4.8  3.9 - 10.3 K/uL Final  . RBC 03/29/2018 4.17  3.70 - 5.45 MIL/uL Final  . Hemoglobin 03/29/2018 11.4* 11.6 - 15.9 g/dL Final  . HCT 03/29/2018 35.6  34.8 - 46.6 % Final  . MCV 03/29/2018 85.4  79.5 - 101.0 fL Final  . MCH 03/29/2018 27.3  25.1 - 34.0 pg Final  . MCHC 03/29/2018 32.0  31.5 - 36.0 g/dL Final  . RDW 03/29/2018 14.7* 11.2 - 14.5 % Final  . Platelet Count 03/29/2018 172  145 - 400 K/uL Final  . Neutrophils Relative % 03/29/2018 45  % Final  . Neutro Abs 03/29/2018 2.2  1.5 - 6.5 K/uL Final  . Lymphocytes Relative 03/29/2018 46  % Final  . Lymphs Abs 03/29/2018 2.2  0.9 - 3.3 K/uL Final  . Monocytes Relative 03/29/2018 9  % Final  . Monocytes Absolute 03/29/2018 0.4  0.1 - 0.9 K/uL Final  . Eosinophils Relative 03/29/2018 0  % Final  . Eosinophils Absolute 03/29/2018 0.0  0.0 - 0.5 K/uL Final  . Basophils Relative 03/29/2018 0  % Final  . Basophils Absolute 03/29/2018 0.0  0.0 - 0.1 K/uL Final   Performed at [facility], [address] [place], [place] [address]    (this displays the last labs from the last 3 days)  No results found for:  TOTALPROTELP, ALBUMINELP, A1GS, A2GS, BETS, BETA2SER, GAMS, MSPIKE, SPEI (this displays SPEP labs)  No results found for: KPAFRELGTCHN, LAMBDASER, KAPLAMBRATIO (kappa/lambda light chains)  No results found for: HGBA, HGBA2QUANT, HGBFQUANT, HGBSQUAN (Hemoglobinopathy evaluation)   No results found for: LDH  Lab Results  Component Value Date   IRON 35 (L) 01/30/2010   IRONPCTSAT 8.8 (L) 01/30/2010   (Iron and TIBC)  No results found for: FERRITIN  Urinalysis    Component Value Date/Time   COLORURINE DARK YELLOW 01/30/2018 1534   APPEARANCEUR CLOUDY (A) 01/30/2018 1534   LABSPEC 1.025 01/30/2018 1534   PHURINE 5.5 01/30/2018 1534   GLUCOSEU NEGATIVE 01/30/2018 1534   GLUCOSEU NEGATIVE 09/27/2017 1002   HGBUR 1+ (A) 01/30/2018 1534   BILIRUBINUR NEGATIVE 09/27/2017 1002   KETONESUR TRACE (A) 01/30/2018 1534   PROTEINUR TRACE (A) 01/30/2018 1534   UROBILINOGEN 0.2 09/27/2017 1002   NITRITE NEGATIVE 09/27/2017 1002   LEUKOCYTESUR NEGATIVE 09/27/2017 1002  STUDIES: No results found.  ELIGIBLE FOR AVAILABLE RESEARCH PROTOCOL: no  ASSESSMENT: 47 y.o. Kelly Rowe, [name] woman status post right breast upper outer quadrant biopsy 01/11/2018 for a clinical T1 N0, stage IA invasive ductal carcinoma, grade 3, estrogen and progesterone receptor positive, HER-2 amplified, with an MIB-1 of 30%.  (1) status post right lumpectomy and sentinel lymph node sampling 02/23/2018 for a pT2 pN1, stage IB invasive ductal carcinoma, grade 3, with close but negative margins  (2) adjuvant chemotherapy consisting of carboplatin, docetaxel, trastuzumab and Pertuzumab every 21 days x 6 to start 03/08/2018  (3) anti-HER-2 immunotherapy to start concurrently with chemotherapy  (a) baseline echocardiogram on 02/07/2018 showed an ejection fraction in the 55-60% range  (4) adjuvant radiation as appropriate  (5) antiestrogens to follow at the completion of local treatment  (6) genetics testing through  [facility] Multi-cancer and Breast panel on 01/13/2018 showed: no deleterious mutations. The following genes were evaluated for sequence changes and exonic deletions/duplications: ALK, APC, ATM, AXIN2, BAP1, BARD1, BLM, BMPR1A, BRCA1, BRCA2, BRIP1, CASR, CDC73, CDH1, CDK4, CDKN1B, CDKN1C,CDKN2A (p14ARF), CDKN2A (p16INK4a), CEBPA, CHEK2, CTNNA1, DICER1, DIS3L2, EPCAM*, FH, FLCN, GATA2, GPC3, GREM1*, HRAS, KIT, MAX, MEN1, MET, MLH1, MSH2, MSH3, MSH6, MUTYH, NBN, NF1, NF2, PALB2, PDGFRA, PHOX2B*, PMS2, POLD1,POLE, POT1, PRKAR1A, PTCH1, PTEN, RAD50, RAD51C, RAD51D, RB1, RECQL4, RET, RUNX1, SDHAF2, SDHB, SDHC, SDHD,SMAD4, SMARCA4, SMARCB1, SMARCE1, STK11, SUFU, TERC, TERT, TMEM127, TP53, TSC1, TSC2, VHL, WRN*, WT1.The following genes were evaluated for sequence changes only: EGFR*, HOXB13*, MITF*, NTHL1*, SDHA  PLAN: Kelly Rowe is feeling much better than two weeks ago.  Her labs have recovered. Her bowel movements have improved.  She is ready to proceed with cycle 2 of her adjuvant chemotherapy.  I reviewed the right axillary area with Dr. Jana Hakim.  He came in and examined Kelly Rowe as well.  She is ok to receive treatment today.  We will start her on Doxycycline today.   After Kelly Rowe left the clinic her blood sugar came back at 412.  I reviewed this with Dr. Jana Hakim, who will adjust her premedications accordingly.  She will also receive 10 units of insulin today.  She will not take any further dexamethasone.  Dr. Jana Hakim will change her chemotherapy orders with subsequent treatments.    Kelly Rowe will return in one week for labs and f/u.  She knows to call for any questions or concerns prior to her next appointment.    A total of (30) minutes of face-to-face time was spent with this patient with greater than 50% of that time in counseling and care-coordination.   Wilber Bihari, NP  03/29/18 8:34 AM Medical Oncology and Hematology [facility] [address] [place], [place]  [address] Tel. [phone]    Fax. [phone]   ADDENDUM: Edit had a very hard time with diarrhea after the first cycle.  I am stopping the Perjeta at this point.  We will consider restarting it cycle 4, without loading dose.  I have very concerned about her very high sugars.  We had tried to restrict the steroids as much as possible but she did need to receive them the day before chemo because of the docetaxel.  Accordingly I am stopping the docetaxel and substituting gemcitabine.  I think with that change she will not need steroids either as premeds or for nausea control.  She will use Compazine as needed for nausea.  We did discuss diabetes managements.  She really needs to be on a pretty much carb free diet if she wants  to bring it under control especially as she will not be able to exercise regularly if she is feeling tired from the chemo.  She is going to discuss this with her primary care physician as well.  We will continue to follow her closely but I think with these changes she will be able to tolerate the remaining chemotherapy cycles without other modifications.  I personally saw this patient and performed a substantive portion of this encounter with the listed APP documented above.   Chauncey Cruel, MD Medical Oncology and Hematology [facility] [address] [place], [place] [address] Tel. [phone]    Fax. [phone]

## 2018-03-30 ENCOUNTER — Ambulatory Visit: Payer: BC Managed Care – PPO | Admitting: Internal Medicine

## 2018-03-30 ENCOUNTER — Encounter: Payer: Self-pay | Admitting: Internal Medicine

## 2018-03-30 VITALS — BP 122/84 | HR 103 | Temp 98.3°F | Ht 62.0 in | Wt 252.0 lb

## 2018-03-30 DIAGNOSIS — I10 Essential (primary) hypertension: Secondary | ICD-10-CM

## 2018-03-30 DIAGNOSIS — K921 Melena: Secondary | ICD-10-CM

## 2018-03-30 DIAGNOSIS — G4700 Insomnia, unspecified: Secondary | ICD-10-CM | POA: Diagnosis not present

## 2018-03-30 DIAGNOSIS — E119 Type 2 diabetes mellitus without complications: Secondary | ICD-10-CM

## 2018-03-30 LAB — T-HELPER CELLS (CD4) COUNT (NOT AT ARMC)
CD4 % Helper T Cell: 61 % — ABNORMAL HIGH (ref 33–55)
CD4 T Cell Abs: 1350 /uL (ref 400–2700)

## 2018-03-30 MED ORDER — ZOLPIDEM TARTRATE 10 MG PO TABS: 10.0000 mg | ORAL_TABLET | Freq: Every evening | ORAL | 1 refills | Status: DC | PRN

## 2018-03-30 NOTE — Assessment & Plan Note (Signed)
Last a1c at 7.4 which is mild elevated but reasonable given her current treatment, and likely glc to improve now off the dexamethasone

## 2018-03-30 NOTE — Assessment & Plan Note (Signed)
stable overall by history and exam, recent data reviewed with pt, and pt to continue medical treatment as before,  to f/u any worsening symptoms or concerns BP Readings from Last 3 Encounters:  03/30/18 122/84  03/29/18 119/71  03/16/18 124/88

## 2018-03-30 NOTE — Assessment & Plan Note (Signed)
Ok for [employer] [facility] [employer] 10 qhs prn,  to f/u any worsening symptoms or concerns

## 2018-03-30 NOTE — Assessment & Plan Note (Signed)
None for 1 wk, but no prior evaluation, for GI referral, likely needs colonoscopy

## 2018-03-30 NOTE — Progress Notes (Signed)
Subjective:    Patient ID: Kelly Rowe, female    DOB: 1971-03-07, 47 y.o.   MRN: [id number]  HPI   Here to f/u, wearing face mask to avoid illness exposure, undergoing chemo for breast ca, most recent labs done yesterday with new slight anemia.  Has hx of iron def anemia but none recently.  Did have 1 wk of fairly significant hematochezia she attributes to hemorrhoidds but no pain or bleeding in the past wk.  Has lost overall 10 lbs after episode of diarrhea.   Due for flus hot.   Wt Readings from Last 3 Encounters:  03/30/18 252 lb (114.3 kg)  03/29/18 252 lb (114.3 kg)  03/15/18 250 lb 8 oz (113.6 kg)  Has been taking dexamethasone but stopped yesterday per oncology after high sugar > 400. Had bleeding hemorrhoids week before last for about 1 wk, but improved with topical tx, no bleeding for about 1 wk.  Did have mild anemia new on labs yesterday. Last a1c at 7.4 on July 24.  Also c/o persistent months of Insomnia 4 nights per wk, sometimes up to 5 am. Has been more stressed, Denies worsening depressive symptoms, suicidal ideation, or panic Past Medical History:  Diagnosis Date  . ANEMIA-IRON DEFICIENCY 01/30/2010  . ANXIETY 11/27/2007  . Arthritis    both knees  . Asthma 02/25/2011  . Carbuncle and furuncle of trunk 04/16/2010  . Chlamydia infection 03/21/2008  . DIABETES MELLITUS, TYPE II 08/02/2007  . Edema 01/30/2010  . ELEVATED BLOOD PRESSURE WITHOUT DIAGNOSIS OF HYPERTENSION 08/02/2007  . Family history of breast cancer   . Family history of colon cancer   . Family history of lung cancer   . FREQUENCY, URINARY 05/19/2009  . GENITAL HERPES 03/12/2009  . HIV (human immunodeficiency virus infection) ([facility]) 2009  . HIV INFECTION 03/12/2009  . HSV (herpes simplex virus) infection   . HYPERLIPIDEMIA 08/02/2007  . Hypertension   . Metrorrhagia 03/21/2008  . PONV (postoperative nausea and vomiting)   . RETENTION, URINE 05/19/2009  . Trichomonas infection 01/19/2010  . VITAMIN D DEFICIENCY  01/30/2010   Past Surgical History:  Procedure Laterality Date  . ABDOMINAL HYSTERECTOMY  07/22/2010   TAH WITH PRESERVATION OF BOTH TUBES AND OVARIES  . BREAST LUMPECTOMY WITH RADIOACTIVE SEED AND SENTINEL LYMPH NODE BIOPSY Right 02/23/2018   Procedure: BREAST LUMPECTOMY WITH RADIOACTIVE SEED AND SENTINEL LYMPH NODE BIOPSY;  Surgeon: Stark Klein, MD;  Location: [facility];  Service: General;  Laterality: Right;  . CHOLECYSTECTOMY    . ENDOMETRIAL ABLATION  01/11/2008   HER OPTION  . ESOPHAGOGASTRODUODENOSCOPY    . MULTIPLE TOOTH EXTRACTIONS    . PORTACATH PLACEMENT Left 02/23/2018   Procedure: INSERTION PORT-A-CATH;  Surgeon: Stark Klein, MD;  Location: [facility];  Service: General;  Laterality: Left;  . TUBAL LIGATION    . WISDOM TOOTH EXTRACTION      reports that she has never smoked. She has never used smokeless tobacco. She reports that she drinks alcohol. She reports that she does not use drugs. family history includes Breast cancer in her paternal aunt, paternal aunt, and paternal aunt; Cancer in her father; Colon cancer in her other and paternal grandfather; Diabetes in her father and mother; Heart attack (age of onset: 79) in her maternal grandfather; Heart disease in her other; Hypertension in her mother; Lung cancer in her paternal aunt; Lung cancer (age of onset: 48) in her maternal grandmother; Prostate cancer in her other; Stroke in her other and paternal  uncle. Allergies  Allergen Reactions  . Levaquin [Levofloxacin In D5w] Nausea And Vomiting and Rash  . Penicillins Swelling and Rash    Has patient had a PCN reaction causing immediate rash, facial/tongue/throat swelling, SOB or lightheadedness with hypotension: Yes Has patient had a PCN reaction causing severe rash involving mucus membranes or skin necrosis: No Has patient had a PCN reaction that required hospitalization: Yes Has patient had a PCN reaction occurring within the last 10 years: No If all of the above answers are "NO",  then may proceed with Cephalosporin use.   Current Outpatient Medications on File Prior to Visit  Medication Sig Dispense Refill  . albuterol (PROVENTIL HFA;VENTOLIN HFA) 108 (90 Base) MCG/ACT inhaler Inhale 2 puffs into the lungs every 6 (six) hours as needed for wheezing or shortness of breath.    Marland Kitchen aspirin 81 MG tablet Take 81 mg by mouth daily as needed (chest pain).     . Azelastine-Fluticasone (DYMISTA) 137-50 MCG/ACT SUSP Use as directed 1 spray each side twice per day as needed (Patient taking differently: Place 1 spray into both nostrils 2 (two) times daily as needed (alleriges). ) 23 g 5  . Beclomethasone Dipropionate (QNASL) 80 MCG/ACT AERS Place 2 sprays into the nose daily. (Patient taking differently: Place 2 sprays into the nose daily as needed (congestion). ) 1 Inhaler 1  . blood glucose meter kit and supplies Dispense based on patient and insurance preference. Use up to four times daily as directed. (FOR ICD-10 E10.9, E11.9). 1 each 0  . Cholecalciferol (VITAMIN D) 2000 units tablet Take 2,000 Units by mouth daily.    Marland Kitchen dexamethasone (DECADRON) 4 MG tablet Take 2 tablets (8 mg total) by mouth 2 (two) times daily. Start the day before Taxotere. Take once the day after, then 2 times a day x 2d. 30 tablet 1  . Diclofenac Sodium (PENNSAID) 2 % SOLN Place 1 application onto the skin 2 (two) times daily. (Patient taking differently: Place 1 application onto the skin 2 (two) times daily as needed (pain). ) 1 Bottle 3  . diphenhydrAMINE HCl (ZZZQUIL) 50 MG/30ML LIQD Take 50 mg by mouth at bedtime as needed (sleep).    Marland Kitchen doxycycline (VIBRA-TABS) 100 MG tablet Take 1 tablet (100 mg total) by mouth 2 (two) times daily. 14 tablet 0  . Dulaglutide (TRULICITY) 1.82 XH/3.7JI SOPN Inject 0.75 mg into the skin once a week. 6 mL 11  . elvitegravir-cobicistat-emtricitabine-tenofovir (STRIBILD) 150-150-200-300 MG TABS tablet Take 1 tablet daily by mouth.    . famotidine (PEPCID) 20 MG tablet Take 1  tablet (20 mg total) by mouth 2 (two) times daily. 60 tablet 3  . ferrous gluconate (IRON 27) 240 (27 FE) MG tablet Take 480 mg by mouth daily.    Marland Kitchen glipiZIDE (GLIPIZIDE XL) 5 MG 24 hr tablet Take 1 tablet (5 mg total) by mouth daily with breakfast. 90 tablet 3  . hydrocortisone (ANUSOL-HC) 2.5 % rectal cream Place 1 application rectally 2 (two) times daily. 30 g 0  . ibuprofen (ADVIL,MOTRIN) 800 MG tablet Take 1 tablet (800 mg total) by mouth every 8 (eight) hours as needed. (Patient taking differently: Take 800 mg by mouth daily as needed for moderate pain. ) 60 tablet 0  . lidocaine-prilocaine (EMLA) cream Apply to affected area once 30 g 3  . LORazepam (ATIVAN) 0.5 MG tablet Take 1 tablet (0.5 mg total) by mouth at bedtime as needed (Nausea or vomiting). 30 tablet 0  . metFORMIN (GLUCOPHAGE-XR) 500 MG  24 hr tablet Take 2 tablets (1,000 mg total) by mouth daily with breakfast. Annual appt is due must see provider for future refills 180 tablet 3  . Multiple Vitamin (MULTIVITAMIN WITH MINERALS) TABS tablet Take 1 tablet by mouth daily.    Marland Kitchen omeprazole (PRILOSEC) 40 MG capsule Take 1 capsule (40 mg total) by mouth daily. 30 capsule 0  . ondansetron (ZOFRAN) 8 MG tablet Take 1 tablet (8 mg total) by mouth every 8 (eight) hours as needed for nausea or vomiting. 20 tablet 0  . oxyCODONE (OXY IR/ROXICODONE) 5 MG immediate release tablet Take 1-2 tablets (5-10 mg total) by mouth every 6 (six) hours as needed for moderate pain, severe pain or breakthrough pain. 30 tablet 0  . polyethylene glycol (MIRALAX / GLYCOLAX) packet Take 17 g by mouth daily as needed for moderate constipation.    . prochlorperazine (COMPAZINE) 10 MG tablet Take 1 tablet (10 mg total) by mouth every 6 (six) hours as needed (Nausea or vomiting). 30 tablet 1  . rosuvastatin (CRESTOR) 40 MG tablet Take 40 mg by mouth every evening.    . traMADol (ULTRAM) 50 MG tablet Take 50 mg by mouth daily as needed for moderate pain.    .  valACYclovir (VALTREX) 500 MG tablet Take twice daily for 3-5 days (Patient taking differently: Take 500 mg by mouth 2 (two) times daily as needed (fever blisters). Take for 3-5 days) 30 tablet 12   No current facility-administered medications on file prior to visit.    Review of Systems  Constitutional: Negative for other unusual diaphoresis or sweats HENT: Negative for ear discharge or swelling Eyes: Negative for other worsening visual disturbances Respiratory: Negative for stridor or other swelling  Gastrointestinal: Negative for worsening distension or other blood Genitourinary: Negative for retention or other urinary change Musculoskeletal: Negative for other MSK pain or swelling Skin: Negative for color change or other new lesions Neurological: Negative for worsening tremors and other numbness  Psychiatric/Behavioral: Negative for worsening agitation or other fatigue All other system neg per pt    Objective:   Physical Exam BP 122/84   Pulse (!) 103   Temp 98.3 F (36.8 C) (Oral)   Ht 5' 2" (1.575 m)   Wt 252 lb (114.3 kg)   LMP 06/21/2010   SpO2 96%   BMI 46.09 kg/m  VS noted,  Constitutional: Pt appears in NAD HENT: Head: NCAT.  Right Ear: External ear normal.  Left Ear: External ear normal.  Eyes: . Pupils are equal, round, and reactive to light. Conjunctivae and EOM are normal Nose: without d/c or deformity Neck: Neck supple. Gross normal ROM Cardiovascular: Normal rate and regular rhythm.   Pulmonary/Chest: Effort normal and breath sounds without rales or wheezing.  Abd:  Soft, NT, ND, + BS, no organomegaly Neurological: Pt is alert. At baseline orientation, motor grossly intact Skin: Skin is warm. No rashes, other new lesions, no LE edema Psychiatric: Pt behavior is normal without agitation  No other exam findings Lab Results  Component Value Date   WBC 4.8 03/29/2018   HGB 11.4 (L) 03/29/2018   HCT 35.6 03/29/2018   PLT 172 03/29/2018   GLUCOSE 412 (H)  03/29/2018   CHOL 202 (H) 09/08/2017   TRIG 92.0 09/08/2017   HDL 48.80 09/08/2017   LDLDIRECT 147.7 07/05/2012   LDLCALC 135 (H) 09/08/2017   ALT 37 03/29/2018   AST 21 03/29/2018   NA 137 03/29/2018   K 3.5 03/29/2018   CL 99 03/29/2018  CREATININE 0.81 03/29/2018   BUN 7 03/29/2018   CO2 26 03/29/2018   TSH 1.41 09/27/2017   HGBA1C 7.4 (H) 02/15/2018   MICROALBUR 1.2 09/27/2017       Assessment & Plan:

## 2018-03-30 NOTE — Patient Instructions (Signed)
Please take all new medication as prescribed - the ambien for sleep  You had the flu shot today  Please continue all other medications as before, and refills have been done if requested.  Please have the pharmacy call with any other refills you may need.  Please continue your efforts at being more active, low cholesterol diet, and weight control.  Please keep your appointments with your specialists as you may have planned  You will be contacted regarding the referral for: Gastroenterology  Please return in 6 months, or sooner if needed, with Lab testing done 3-5 days before

## 2018-03-31 ENCOUNTER — Inpatient Hospital Stay: Payer: BC Managed Care – PPO

## 2018-03-31 ENCOUNTER — Encounter: Payer: Self-pay | Admitting: Gastroenterology

## 2018-03-31 DIAGNOSIS — C50411 Malignant neoplasm of upper-outer quadrant of right female breast: Secondary | ICD-10-CM

## 2018-03-31 DIAGNOSIS — Z5112 Encounter for antineoplastic immunotherapy: Secondary | ICD-10-CM | POA: Diagnosis not present

## 2018-03-31 DIAGNOSIS — Z170 Estrogen receptor positive status [ER+]: Secondary | ICD-10-CM

## 2018-03-31 MED ORDER — PEGFILGRASTIM-CBQV 6 MG/0.6ML ~~LOC~~ SOSY
6.0000 mg | PREFILLED_SYRINGE | Freq: Once | SUBCUTANEOUS | Status: AC
  Administered 2018-03-31: 6 mg via SUBCUTANEOUS

## 2018-04-01 ENCOUNTER — Emergency Department (HOSPITAL_COMMUNITY)
Admission: EM | Admit: 2018-04-01 | Discharge: 2018-04-01 | Disposition: A | Discharge status: Home / Self Care | Payer: BC Managed Care – PPO | Attending: Emergency Medicine | Admitting: Emergency Medicine

## 2018-04-01 ENCOUNTER — Other Ambulatory Visit: Payer: Self-pay

## 2018-04-01 ENCOUNTER — Emergency Department (HOSPITAL_COMMUNITY): Payer: BC Managed Care – PPO

## 2018-04-01 ENCOUNTER — Encounter (HOSPITAL_COMMUNITY): Payer: Self-pay | Admitting: *Deleted

## 2018-04-01 DIAGNOSIS — E119 Type 2 diabetes mellitus without complications: Secondary | ICD-10-CM | POA: Insufficient documentation

## 2018-04-01 DIAGNOSIS — Z79899 Other long term (current) drug therapy: Secondary | ICD-10-CM | POA: Diagnosis not present

## 2018-04-01 DIAGNOSIS — Z853 Personal history of malignant neoplasm of breast: Secondary | ICD-10-CM | POA: Diagnosis not present

## 2018-04-01 DIAGNOSIS — R310 Gross hematuria: Secondary | ICD-10-CM

## 2018-04-01 DIAGNOSIS — B20 Human immunodeficiency virus [HIV] disease: Secondary | ICD-10-CM | POA: Insufficient documentation

## 2018-04-01 DIAGNOSIS — R1032 Left lower quadrant pain: Secondary | ICD-10-CM | POA: Diagnosis present

## 2018-04-01 DIAGNOSIS — Q625 Duplication of ureter: Secondary | ICD-10-CM | POA: Diagnosis not present

## 2018-04-01 HISTORY — DX: Malignant (primary) neoplasm, unspecified: C80.1

## 2018-04-01 LAB — URINALYSIS, ROUTINE W REFLEX MICROSCOPIC
Bacteria, UA: NONE SEEN
Bilirubin Urine: NEGATIVE
Glucose, UA: >=500 mg/dL — AB
Ketones, ur: 5 mg/dL — AB
Leukocytes, UA: NEGATIVE
Nitrite: NEGATIVE
Protein, ur: 100 mg/dL — AB
RBC / HPF: >50 RBC/hpf — ABNORMAL HIGH (ref 0–5)
Specific Gravity, Urine: 1.021 (ref 1.005–1.030)
pH: 6 (ref 5.0–8.0)

## 2018-04-01 LAB — CBC WITH DIFFERENTIAL/PLATELET
Basophils Absolute: 0 10*3/uL (ref 0.0–0.1)
Basophils Relative: 0 %
Eosinophils Absolute: 0 10*3/uL (ref 0.0–0.7)
Eosinophils Relative: 0 %
HCT: 34.3 % — ABNORMAL LOW (ref 36.0–46.0)
Hemoglobin: 11.4 g/dL — ABNORMAL LOW (ref 12.0–15.0)
Lymphocytes Relative: 7 %
Lymphs Abs: 0.7 10*3/uL (ref 0.7–4.0)
MCH: 27.9 pg (ref 26.0–34.0)
MCHC: 33.2 g/dL (ref 30.0–36.0)
MCV: 84.1 fL (ref 78.0–100.0)
Monocytes Absolute: 0.1 10*3/uL (ref 0.1–1.0)
Monocytes Relative: 1 %
Neutro Abs: 8.1 10*3/uL — ABNORMAL HIGH (ref 1.7–7.7)
Neutrophils Relative %: 92 %
Platelets: 122 10*3/uL — ABNORMAL LOW (ref 150–400)
RBC: 4.08 MIL/uL (ref 3.87–5.11)
RDW: 14.7 % (ref 11.5–15.5)
WBC: 8.9 10*3/uL (ref 4.0–10.5)

## 2018-04-01 LAB — COMPREHENSIVE METABOLIC PANEL
ALT: 68 U/L — ABNORMAL HIGH (ref 0–44)
AST: 92 U/L — ABNORMAL HIGH (ref 15–41)
Albumin: 3.2 g/dL — ABNORMAL LOW (ref 3.5–5.0)
Alkaline Phosphatase: 151 U/L — ABNORMAL HIGH (ref 38–126)
Anion gap: 10 (ref 5–15)
BUN: 7 mg/dL (ref 6–20)
CO2: 28 mmol/L (ref 22–32)
Calcium: 8.4 mg/dL — ABNORMAL LOW (ref 8.9–10.3)
Chloride: 100 mmol/L (ref 98–111)
Creatinine, Ser: 0.59 mg/dL (ref 0.44–1.00)
GFR calc Af Amer: >60 mL/min (ref >60)
GFR calc non Af Amer: >60 mL/min (ref >60)
Glucose, Bld: 318 mg/dL — ABNORMAL HIGH (ref 70–99)
Potassium: 3.3 mmol/L — ABNORMAL LOW (ref 3.5–5.1)
Sodium: 138 mmol/L (ref 135–145)
Total Bilirubin: 0.4 mg/dL (ref 0.3–1.2)
Total Protein: 6.2 g/dL — ABNORMAL LOW (ref 6.5–8.1)

## 2018-04-01 LAB — LIPASE, BLOOD: Lipase: 29 U/L (ref 11–51)

## 2018-04-01 MED ORDER — IOPAMIDOL (ISOVUE-300) INJECTION 61%
100.0000 mL | Freq: Once | INTRAVENOUS | Status: AC | PRN
  Administered 2018-04-01: 100 mL via INTRAVENOUS

## 2018-04-01 MED ORDER — LIDOCAINE-PRILOCAINE 2.5-2.5 % EX CREA
TOPICAL_CREAM | Freq: Once | CUTANEOUS | Status: DC
  Filled 2018-04-01: qty 5

## 2018-04-01 MED ORDER — IOPAMIDOL (ISOVUE-300) INJECTION 61%
INTRAVENOUS | Status: AC
  Filled 2018-04-01: qty 100

## 2018-04-01 MED ORDER — HEPARIN SOD (PORK) LOCK FLUSH 100 UNIT/ML IV SOLN
500.0000 [IU] | Freq: Once | INTRAVENOUS | Status: DC
  Filled 2018-04-01: qty 5

## 2018-04-01 MED ORDER — SODIUM CHLORIDE 0.9 % IV BOLUS
1000.0000 mL | Freq: Once | INTRAVENOUS | Status: AC
  Administered 2018-04-01: 1000 mL via INTRAVENOUS

## 2018-04-01 MED ORDER — ACETAMINOPHEN 500 MG PO TABS
1000.0000 mg | ORAL_TABLET | Freq: Once | ORAL | Status: AC
  Administered 2018-04-01: 1000 mg via ORAL
  Filled 2018-04-01: qty 2

## 2018-04-01 NOTE — ED Provider Notes (Signed)
[facility] DEPT Provider Note  CSN: [id number] Arrival date & time: 04/01/18 0001  Chief Complaint(s) Abdominal Pain  HPI Kelly Rowe is a 47 y.o. female    Abdominal Pain   This is a new problem. The current episode started 6 to 12 hours ago (lasted for about 20-30 min). Episode frequency: intermittent. The problem has been resolved. The pain is associated with an unknown factor. The pain is located in the LLQ. The quality of the pain is cramping. The pain is moderate. Associated symptoms include hematochezia, nausea, constipation and hematuria. Pertinent negatives include fever, diarrhea, melena, vomiting, dysuria, frequency, arthralgias and myalgias. Nothing aggravates the symptoms. Relieved by: drinking warm water. Past medical history comments: Breast cancer on chemo.    Past Medical History Past Medical History:  Diagnosis Date  . ANEMIA-IRON DEFICIENCY 01/30/2010  . ANXIETY 11/27/2007  . Arthritis    both knees  . Asthma 02/25/2011  . Cancer ([place])    breast  . Carbuncle and furuncle of trunk 04/16/2010  . Chlamydia infection 03/21/2008  . DIABETES MELLITUS, TYPE II 08/02/2007  . Edema 01/30/2010  . ELEVATED BLOOD PRESSURE WITHOUT DIAGNOSIS OF HYPERTENSION 08/02/2007  . Family history of breast cancer   . Family history of colon cancer   . Family history of lung cancer   . FREQUENCY, URINARY 05/19/2009  . GENITAL HERPES 03/12/2009  . HIV (human immunodeficiency virus infection) ([place]) 2009  . HIV INFECTION 03/12/2009  . HSV (herpes simplex virus) infection   . HYPERLIPIDEMIA 08/02/2007  . Hypertension   . Metrorrhagia 03/21/2008  . PONV (postoperative nausea and vomiting)   . RETENTION, URINE 05/19/2009  . Trichomonas infection 01/19/2010  . VITAMIN D DEFICIENCY 01/30/2010   Patient Active Problem List   Diagnosis Date Noted  . Hematochezia 03/30/2018  . Insomnia 03/30/2018  . Port-A-Cath in place 03/29/2018  . Genetic testing 01/30/2018  .  Neoplasm of breast, regional lymph node staging category N3b: metastasis in ipsilateral internal mammary lymph node and axillary lymph node ([place]) 01/30/2018  . Family history of breast cancer   . Family history of colon cancer   . Family history of lung cancer   . Diabetes mellitus type 2 without retinopathy ([place]) 01/18/2018  . Morbid obesity ([place]) 01/18/2018  . Malignant neoplasm of upper-outer quadrant of right breast in female, estrogen receptor positive ([place]) 01/17/2018  . Achilles tendinitis 11/28/2017  . Hot flashes 09/30/2017  . Hypertension 09/08/2017  . Contusion of left knee and lower leg 11/18/2015  . Mild intermittent asthma 08/15/2013  . BV (bacterial vaginosis) 08/11/2012  . Right knee pain 07/05/2012  . GERD (gastroesophageal reflux disease) 07/05/2012  . GC (gonococcus) 05/30/2012  . Chlamydia 05/30/2012  . Left knee pain 01/12/2012  . Cough 11/01/2011  . Encounter for long-term (current) use of high-risk medication 02/25/2011  . Preventative health care 02/14/2011  . VITAMIN D DEFICIENCY 01/30/2010  . ANEMIA-IRON DEFICIENCY 01/30/2010  . Edema 01/30/2010  . HIV (human immunodeficiency virus infection) ([place]) 03/12/2009  . GENITAL HERPES 03/12/2009  . Anxiety state 11/27/2007  . Diabetes ([place]) 08/02/2007  . Hyperlipidemia 08/02/2007   Home Medication(s) Prior to Admission medications   Medication Sig Start Date End Date Taking? Authorizing Provider  dexamethasone (DECADRON) 4 MG tablet Take 2 tablets (8 mg total) by mouth 2 (two) times daily. Start the day before [place]. Take once the day after, then 2 times a day x 2d. 03/03/18  Yes [name], [name], MD  Diclofenac Sodium (PENNSAID)  2 % SOLN Place 1 application onto the skin 2 (two) times daily. Patient taking differently: Place 1 application onto the skin 2 (two) times daily as needed.  11/30/17  Yes Rosemarie Ax, MD  doxycycline (VIBRA-TABS) 100 MG tablet Take 1 tablet (100 mg total) by mouth 2 (two) times  daily. 03/29/18  Yes Causey, Charlestine Massed, NP  famotidine (PEPCID) 20 MG tablet Take 1 tablet (20 mg total) by mouth 2 (two) times daily. 03/13/18  Yes [name], [name], MD  ferrous gluconate (IRON 27) 240 (27 FE) MG tablet Take 480 mg by mouth daily.   Yes [provider]  hydrocortisone (ANUSOL-HC) 2.5 % rectal cream Place 1 application rectally 2 (two) times daily. 03/15/18  Yes Causey, Charlestine Massed, NP  ibuprofen (ADVIL,MOTRIN) 800 MG tablet Take 1 tablet (800 mg total) by mouth every 8 (eight) hours as needed. Patient taking differently: Take 800 mg by mouth daily as needed for moderate pain.  11/28/17  Yes Biagio Borg, MD  lidocaine-prilocaine (EMLA) cream Apply to affected area once Patient taking differently: Apply 1 application topically once. Apply to affected area once 03/03/18  Yes [name], [name], MD  LORazepam (ATIVAN) 0.5 MG tablet Take 1 tablet (0.5 mg total) by mouth at bedtime as needed (Nausea or vomiting). 03/03/18  Yes [name], [name], MD  metFORMIN (GLUCOPHAGE-XR) 500 MG 24 hr tablet Take 2 tablets (1,000 mg total) by mouth daily with breakfast. Annual appt is due must see provider for future refills 09/27/17  Yes Biagio Borg, MD  Multiple Vitamin (MULTIVITAMIN WITH MINERALS) TABS tablet Take 1 tablet by mouth daily.   Yes [provider]  naproxen (NAPROSYN) 500 MG tablet Take 500 mg by mouth 2 (two) times daily as needed for moderate pain.   Yes [provider]  omeprazole (PRILOSEC) 40 MG capsule Take 1 capsule (40 mg total) by mouth daily. 03/15/18  Yes Causey, Charlestine Massed, NP  ondansetron (ZOFRAN) 8 MG tablet Take 1 tablet (8 mg total) by mouth every 8 (eight) hours as needed for nausea or vomiting. 03/15/18  Yes Causey, Charlestine Massed, NP  oxyCODONE (OXY IR/ROXICODONE) 5 MG immediate release tablet Take 1-2 tablets (5-10 mg total) by mouth every 6 (six) hours as needed for moderate pain, severe pain or breakthrough pain. 02/23/18  Yes  Stark Klein, MD  prochlorperazine (COMPAZINE) 10 MG tablet Take 1 tablet (10 mg total) by mouth every 6 (six) hours as needed (Nausea or vomiting). 03/03/18  Yes [name], [name], MD  zolpidem (AMBIEN) 10 MG tablet Take 1 tablet (10 mg total) by mouth at bedtime as needed for sleep. 03/30/18  Yes Biagio Borg, MD  albuterol (PROVENTIL HFA;VENTOLIN HFA) 108 (90 Base) MCG/ACT inhaler Inhale 2 puffs into the lungs every 6 (six) hours as needed for wheezing or shortness of breath.    [provider]  aspirin 81 MG tablet Take 81 mg by mouth daily as needed (chest pain).     [provider]  Azelastine-Fluticasone [facility]) 137-50 MCG/ACT SUSP Use as directed 1 spray each side twice per day as needed Patient not taking: Reported on 04/01/2018 09/15/17   Biagio Borg, MD  Beclomethasone Dipropionate (QNASL) 80 MCG/ACT AERS Place 2 sprays into the nose daily. Patient not taking: Reported on 04/01/2018 09/08/17   Hoyt Koch, MD  blood glucose meter kit and supplies Dispense based on patient and insurance preference. Use up to four times daily as directed. (FOR ICD-10 E10.9, E11.9).  09/27/17   Biagio Borg, MD  Dulaglutide (TRULICITY) 2.03 TD/9.7CB SOPN Inject 0.75 mg into the skin once a week. Patient not taking: Reported on 04/01/2018 09/27/17   Biagio Borg, MD  elvitegravir-cobicistat-emtricitabine-tenofovir (STRIBILD) 150-150-200-300 MG TABS tablet Take 1 tablet daily by mouth. 03/11/17   [provider]  glipiZIDE (GLIPIZIDE XL) 5 MG 24 hr tablet Take 1 tablet (5 mg total) by mouth daily with breakfast. Patient not taking: Reported on 04/01/2018 09/27/17   Biagio Borg, MD  valACYclovir (VALTREX) 500 MG tablet Take twice daily for 3-5 days Patient not taking: Reported on 04/01/2018 06/08/17   [name], NP                                                                                                                                    Past Surgical History Past Surgical  History:  Procedure Laterality Date  . ABDOMINAL HYSTERECTOMY  07/22/2010   TAH WITH PRESERVATION OF BOTH TUBES AND OVARIES  . BREAST LUMPECTOMY WITH RADIOACTIVE SEED AND SENTINEL LYMPH NODE BIOPSY Right 02/23/2018   Procedure: BREAST LUMPECTOMY WITH RADIOACTIVE SEED AND SENTINEL LYMPH NODE BIOPSY;  Surgeon: Stark Klein, MD;  Location: [facility];  Service: General;  Laterality: Right;  . CHOLECYSTECTOMY    . ENDOMETRIAL ABLATION  01/11/2008   HER OPTION  . ESOPHAGOGASTRODUODENOSCOPY    . MULTIPLE TOOTH EXTRACTIONS    . PORTACATH PLACEMENT Left 02/23/2018   Procedure: INSERTION PORT-A-CATH;  Surgeon: Stark Klein, MD;  Location: [facility];  Service: General;  Laterality: Left;  . TUBAL LIGATION    . WISDOM TOOTH EXTRACTION     Family History Family History  Problem Relation Age of Onset  . Prostate cancer Other   . Heart disease Other   . Stroke Other   . Diabetes Mother   . Hypertension Mother   . Diabetes Father   . Cancer Father        COLON and LU NG  . Stroke Paternal Uncle   . Breast cancer Paternal Aunt   . Lung cancer Maternal Grandmother 43       Mesothelioma  . Colon cancer Paternal Grandfather        dx over 56s  . Lung cancer Paternal Aunt   . Breast cancer Paternal Aunt   . Breast cancer Paternal Aunt   . Heart attack Maternal Grandfather 71  . Colon cancer Other        MGM's 5 brothers    Social History Social History   Tobacco Use  . Smoking status: Never Smoker  . Smokeless tobacco: Never Used  Substance Use Topics  . Alcohol use: Yes    Alcohol/week: 0.0 standard drinks    Comment: OCCASIONALLY  . Drug use: No   Allergies Levaquin [levofloxacin in d5w] and Penicillins  Review of Systems Review of Systems  Constitutional: Negative for fever.  Gastrointestinal: Positive for abdominal pain, blood  in stool (due to hemorrhoids), constipation, hematochezia and nausea. Negative for diarrhea, melena and vomiting.  Genitourinary: Positive for hematuria.  Negative for dysuria, frequency, vaginal bleeding and vaginal discharge.  Musculoskeletal: Negative for arthralgias and myalgias.   All other systems are reviewed and are negative for acute change except as noted in the HPI  Physical Exam Vital Signs  I have reviewed the triage vital signs BP 105/69 (BP Location: Left Arm)   Pulse (!) 117   Temp 99 F (37.2 C) (Oral)   Resp 16   LMP 06/21/2010   SpO2 98%   Physical Exam  Constitutional: She is oriented to person, place, and time. She appears well-developed and well-nourished. No distress.  HENT:  Head: Normocephalic and atraumatic.  Right Ear: External ear normal.  Left Ear: External ear normal.  Nose: Nose normal.  Eyes: Conjunctivae and EOM are normal. No scleral icterus.  Neck: Normal range of motion and phonation normal.  Cardiovascular: Normal rate and regular rhythm.  Pulmonary/Chest: Effort normal. No stridor. No respiratory distress.  Abdominal: She exhibits no distension. There is no tenderness. There is no rigidity, no rebound and no guarding.  Genitourinary: No erythema or bleeding in the vagina. No foreign body in the vagina. No vaginal discharge found.  Genitourinary Comments: Chaperone present during pelvic exam.   Musculoskeletal: Normal range of motion. She exhibits no edema.  Neurological: She is alert and oriented to person, place, and time.  Skin: She is not diaphoretic.  Psychiatric: She has a normal mood and affect. Her behavior is normal.  Vitals reviewed.   ED Results and Treatments Labs (all labs ordered are listed, but only abnormal results are displayed) Labs Reviewed  CBC WITH DIFFERENTIAL/PLATELET - Abnormal; Notable for the following components:      Result Value   Hemoglobin 11.4 (*)    HCT 34.3 (*)    Platelets 122 (*)    Neutro Abs 8.1 (*)    All other components within normal limits  COMPREHENSIVE METABOLIC PANEL - Abnormal; Notable for the following components:   Potassium 3.3 (*)      Glucose, Bld 318 (*)    Calcium 8.4 (*)    Total Protein 6.2 (*)    Albumin 3.2 (*)    AST 92 (*)    ALT 68 (*)    Alkaline Phosphatase 151 (*)    All other components within normal limits  URINALYSIS, ROUTINE W REFLEX MICROSCOPIC - Abnormal; Notable for the following components:   Color, Urine RED (*)    APPearance HAZY (*)    Glucose, UA >=500 (*)    Hgb urine dipstick LARGE (*)    Ketones, ur 5 (*)    Protein, ur 100 (*)    RBC / HPF >50 (*)    All other components within normal limits  LIPASE, BLOOD  EKG  EKG Interpretation  Date/Time:    Ventricular Rate:    PR Interval:    QRS Duration:   QT Interval:    QTC Calculation:   R Axis:     Text Interpretation:        Radiology Ct Abdomen Pelvis W Contrast  Result Date: 04/01/2018 CLINICAL DATA:  Abdominal cramping. Hematuria. Breast cancer patient with last chemotherapy on Wednesday. EXAM: CT ABDOMEN AND PELVIS WITH CONTRAST TECHNIQUE: Multidetector CT imaging of the abdomen and pelvis was performed using the standard protocol following bolus administration of intravenous contrast. CONTRAST:  189m ISOVUE-300 IOPAMIDOL (ISOVUE-300) INJECTION 61% COMPARISON:  None. FINDINGS: Lower chest: Mild dependent changes in the lung bases. Hepatobiliary: Mild diffuse fatty infiltration of the liver. No focal liver abnormality is seen. Status post cholecystectomy. No biliary dilatation. Pancreas: Unremarkable. No pancreatic ductal dilatation or surrounding inflammatory changes. Spleen: Normal in size without focal abnormality. Adrenals/Urinary Tract: No adrenal gland nodules. Subcentimeter cyst in the left kidney. Kidneys are symmetrical in size. There is mild right hydronephrosis and hydroureter. No obstructing ureteral stone is identified. This could indicate a non radiopaque stone, stricture, reflux or pyelonephritis.  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 The lower pole moiety in a duplicated system is susceptible to reflux. The bladder is unremarkable. Stomach/Bowel: Stomach is within normal limits. Appendix appears normal. No evidence of bowel wall thickening, distention, or inflammatory changes. Vascular/Lymphatic: No significant vascular findings are present. No enlarged abdominal or pelvic lymph nodes. Reproductive: Status post hysterectomy. No adnexal masses. Other: No abdominal wall hernia or abnormality. No abdominopelvic ascites. Musculoskeletal: No acute or significant osseous findings. IMPRESSION: 1. 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 No obstructing ureteral stone is identified. This could indicate a non radiopaque stone, stricture, reflux or pyelonephritis. The lower pole moiety in a duplicated system is susceptible to reflux. 2. Mild diffuse fatty infiltration of the liver. 3. Status post cholecystectomy and hysterectomy. Electronically Signed   By: WLucienne CapersM.D.   On: 04/01/2018 06:34   Pertinent labs & imaging results that were available during my care of the patient were reviewed by me and considered in my medical decision making (see chart for details).  Medications Ordered in ED Medications  lidocaine-prilocaine (EMLA) cream (has no administration in time range)  iopamidol (ISOVUE-300) 61 % injection (has no administration in time range)  sodium chloride 0.9 % bolus 1,000 mL (1,000 mLs Intravenous New Bag/Given 04/01/18 0414)  iopamidol (ISOVUE-300) 61 % injection 100 mL (100 mLs Intravenous Contrast Given 04/01/18 0558)                                                                                                                                    Procedures Procedures  (including critical care time)  Medical Decision Making / ED Course I have reviewed the  nursing notes for this encounter and the patient's prior records (if available in EHR or on provided paperwork).    Abdominal cramping now resolved.  Abdomen benign.  Work-up notable for hematuria without evidence of urinary tract infection.  No prior history of stones but patient has a history of breast cancer.  CT abdomen obtained revealing no evidence of acute intra-abdominal inflammatory/infectious process.  It did reveal duplicates right renal collecting system and evidence of possible non-radiopaque stone.  Renal function intact.  Otherwise work-up grossly reassuring.  Recommended urology follow-up  The patient appears reasonably screened and/or stabilized for discharge and I doubt any other medical condition or other [facility] requiring further screening, evaluation, or treatment in the ED at this time prior to discharge.  The patient is safe for discharge with strict return precautions.   Final Clinical Impression(s) / ED Diagnoses Final diagnoses:  Gross hematuria  Duplicated renal collecting system    Disposition: Discharge  Condition: Good  I have discussed the results, Dx and Tx plan with the patient who expressed understanding and agree(s) with the plan. Discharge instructions discussed at great length. The patient was given strict return precautions who verbalized understanding of the instructions. No further questions at time of discharge.    ED Discharge Orders    None       Follow Up: Biagio Borg, MD [place] [id number] [phone]  Schedule an appointment as soon as possible for a visit  As needed  Winter, Christopher Aaron, MD [place] [address] [place] [address] [phone]  Schedule an appointment as soon as possible for a visit  in 1-2 weeks, For close follow up to assess for blood in the urine     This chart was dictated using voice recognition software.  Despite best efforts to proofread,  errors can occur which  can change the documentation meaning.   Fatima Blank, MD 04/01/18 [phone]

## 2018-04-01 NOTE — ED Notes (Signed)
Patient port was deaccessed .

## 2018-04-01 NOTE — ED Notes (Signed)
Patient given discharge teaching and verbalized understanding. Patient ambulated out of ED with a steady gait. 

## 2018-04-01 NOTE — ED Triage Notes (Signed)
Pt c/o abdominal cramping around 1600, took tylenol and stool softener. Pt says when she went to urinate she noticed some blood. Pt is breast cancer pt, last chemo Wed. She has a left chest port.

## 2018-04-03 ENCOUNTER — Telehealth: Payer: Self-pay | Admitting: *Deleted

## 2018-04-03 NOTE — Telephone Encounter (Signed)
Noted faxed communication from [facility] stating pt called with abd pain - advised to go to the ER.  Note in Epic shows pt went to the ER and was diagnosed with kidney stones- d/c home with advice to see an urologist.  Pt called this afternoon stating she was seen by her infectious disease doctor - who was made aware of above but she wanted to follow up with this office as well due to concern with current treatment plan.  This RN discussed above - including overall kidney stones themselves do not affect treatment schedule- concern is related to pain and passing of stones. Urologist referral is appropriate for best work up and coordination of care.  Per discussion- Bl[place] will contact [facility] for an appointment- if any issues she understands to call this office.  No further needs at this time.  This note will me sent to [facility]/NP per scheduled visit 9/11

## 2018-04-05 ENCOUNTER — Encounter: Payer: Self-pay | Admitting: Adult Health

## 2018-04-05 ENCOUNTER — Other Ambulatory Visit: Payer: Self-pay | Admitting: Adult Health

## 2018-04-05 ENCOUNTER — Telehealth: Payer: Self-pay | Admitting: Adult Health

## 2018-04-05 ENCOUNTER — Inpatient Hospital Stay (HOSPITAL_BASED_OUTPATIENT_CLINIC_OR_DEPARTMENT_OTHER): Payer: BC Managed Care – PPO | Admitting: Adult Health

## 2018-04-05 ENCOUNTER — Inpatient Hospital Stay: Payer: BC Managed Care – PPO

## 2018-04-05 VITALS — BP 97/65 | HR 113 | Temp 98.0°F | Resp 18 | Ht 62.0 in | Wt 240.4 lb

## 2018-04-05 DIAGNOSIS — C50911 Malignant neoplasm of unspecified site of right female breast: Secondary | ICD-10-CM

## 2018-04-05 DIAGNOSIS — C50411 Malignant neoplasm of upper-outer quadrant of right female breast: Secondary | ICD-10-CM

## 2018-04-05 DIAGNOSIS — Z5112 Encounter for antineoplastic immunotherapy: Secondary | ICD-10-CM | POA: Diagnosis not present

## 2018-04-05 DIAGNOSIS — E876 Hypokalemia: Secondary | ICD-10-CM | POA: Diagnosis not present

## 2018-04-05 DIAGNOSIS — D709 Neutropenia, unspecified: Secondary | ICD-10-CM

## 2018-04-05 DIAGNOSIS — E860 Dehydration: Secondary | ICD-10-CM

## 2018-04-05 DIAGNOSIS — C773 Secondary and unspecified malignant neoplasm of axilla and upper limb lymph nodes: Secondary | ICD-10-CM

## 2018-04-05 DIAGNOSIS — Z95828 Presence of other vascular implants and grafts: Secondary | ICD-10-CM

## 2018-04-05 DIAGNOSIS — Z170 Estrogen receptor positive status [ER+]: Secondary | ICD-10-CM

## 2018-04-05 DIAGNOSIS — G629 Polyneuropathy, unspecified: Secondary | ICD-10-CM

## 2018-04-05 LAB — CMP (CANCER CENTER ONLY)
ALT: 81 U/L — ABNORMAL HIGH (ref 0–44)
AST: 48 U/L — ABNORMAL HIGH (ref 15–41)
Albumin: 3.6 g/dL (ref 3.5–5.0)
Alkaline Phosphatase: 229 U/L — ABNORMAL HIGH (ref 38–126)
Anion gap: 15 (ref 5–15)
BUN: 9 mg/dL (ref 6–20)
CO2: 27 mmol/L (ref 22–32)
Calcium: 9.4 mg/dL (ref 8.9–10.3)
Chloride: 90 mmol/L — ABNORMAL LOW (ref 98–111)
Creatinine: 1.3 mg/dL — ABNORMAL HIGH (ref 0.44–1.00)
GFR, Est AFR Am: 55 mL/min — ABNORMAL LOW (ref >60)
GFR, Estimated: 47 mL/min — ABNORMAL LOW (ref >60)
Glucose, Bld: 419 mg/dL — ABNORMAL HIGH (ref 70–99)
Potassium: 3.2 mmol/L — ABNORMAL LOW (ref 3.5–5.1)
Sodium: 132 mmol/L — ABNORMAL LOW (ref 135–145)
Total Bilirubin: 0.6 mg/dL (ref 0.3–1.2)
Total Protein: 7.3 g/dL (ref 6.5–8.1)

## 2018-04-05 LAB — CBC WITH DIFFERENTIAL (CANCER CENTER ONLY)
Basophils Absolute: 0 10*3/uL (ref 0.0–0.1)
Basophils Relative: 0 %
Eosinophils Absolute: 0 10*3/uL (ref 0.0–0.5)
Eosinophils Relative: 1 %
HCT: 35 % (ref 34.8–46.6)
Hemoglobin: 11.7 g/dL (ref 11.6–15.9)
Lymphocytes Relative: 52 %
Lymphs Abs: 1.4 10*3/uL (ref 0.9–3.3)
MCH: 27.8 pg (ref 25.1–34.0)
MCHC: 33.3 g/dL (ref 31.5–36.0)
MCV: 83.5 fL (ref 79.5–101.0)
Monocytes Absolute: 0.6 10*3/uL (ref 0.1–0.9)
Monocytes Relative: 21 %
Neutro Abs: 0.7 10*3/uL — ABNORMAL LOW (ref 1.5–6.5)
Neutrophils Relative %: 26 %
Platelet Count: 126 10*3/uL — ABNORMAL LOW (ref 145–400)
RBC: 4.19 MIL/uL (ref 3.70–5.45)
RDW: 14 % (ref 11.2–14.5)
WBC Count: 2.6 10*3/uL — ABNORMAL LOW (ref 3.9–10.3)

## 2018-04-05 MED ORDER — SODIUM CHLORIDE 0.9% FLUSH
10.0000 mL | Freq: Once | INTRAVENOUS | Status: DC
  Filled 2018-04-05: qty 10

## 2018-04-05 MED ORDER — SODIUM CHLORIDE 0.9 % IV SOLN
Freq: Once | INTRAVENOUS | Status: AC
  Administered 2018-04-05: 11:00:00 via INTRAVENOUS
  Filled 2018-04-05: qty 1000

## 2018-04-05 MED ORDER — HEPARIN SOD (PORK) LOCK FLUSH 100 UNIT/ML IV SOLN
500.0000 [IU] | Freq: Once | INTRAVENOUS | Status: DC
  Filled 2018-04-05: qty 5

## 2018-04-05 MED ORDER — INSULIN REGULAR HUMAN 100 UNIT/ML IJ SOLN
10.0000 [IU] | Freq: Once | INTRAMUSCULAR | Status: AC
  Administered 2018-04-05: 10 [IU] via SUBCUTANEOUS
  Filled 2018-04-05: qty 0.1

## 2018-04-05 NOTE — Telephone Encounter (Signed)
Need to add lab and flush and MD after I get approval I will have to call patient

## 2018-04-05 NOTE — Progress Notes (Signed)
[place] [facility]  [phone] [phone]     ID: [name] DOB: 01-Jul-1971  MR#: [id number]  IWL#:[id number]  Patient Care Team: Biagio Borg, MD as PCP - [name], MD as Consulting Physician (General Surgery) [name], [name], MD as Consulting Physician (Oncology) [name], MD as Attending Physician (Radiation Oncology) [name], NP as Nurse Practitioner (Obstetrics and Gynecology) Marlinda Mike, PA-C as Referring Physician (Physician Assistant) OTHER MD:   CHIEF COMPLAINT: Triple positive breast cancer  CURRENT TREATMENT: adjuvant chemotherapy   HISTORY OF CURRENT ILLNESS: From the original intake note:  "Kelly Rowe" noticed bruising in her lateral right breast and palpated a mass  on 12/23/2017. She followed up with her gynecologist. She underwent unilateral right diagnostic mammography with tomography and right breast ultrasonography at [facility] on 01/05/2018 showing: breast density category B. There is an irregular highly suspicious mass within the right breast at the 9:30 o'clock upper outer quadrant, measuring 1.8 x 1.1 x 1.5 cm, and located 18 cm from the nipple,  Ultrasonography revealed a single morphologically abnormal lymph node in the RIGHT axilla, with cortical thickness of 6 mm.   Accordingly on 01/11/2018 she proceeded to biopsy of the right breast area in question as well as a suspicious lymph node. The pathology from this procedure showed ([id number]): Invasive ductal carcinoma grade III.  The lymph node biopsied was negative for carcinoma (concordant).. Prognostic indicators significant for: estrogen receptor, 60% positive with weak staining intensity and progesterone receptor, 10% positive with strong staining intensity. Proliferation marker Ki67 at 30%. HER2 amplified with ratios HER2/CEP17 signals 2.26 and average HER2 copies per cell 3.50  The patient's subsequent history is as detailed  below.  INTERVAL HISTORY: Kelly Rowe returns today for follow up and treatment of her triple positive breast cancer accompanied by her mother. She is receiving neoadjuvant chemotherapy with Docetaxel, Carboplatin, Trastuzumab, Pertuzumab given on day 1 of a 21 day cycle.  She receives growth factor support on day 3 with Udenyca.  Pertuzumab was removed after cycle 1 due to diarrhea.  Today is cycle 2 day 8.  The Docetaxel will be replaced with Gemcitabine after cycle 2 due to uncontrolled hyperglycemia from dexamethasone.  REVIEW OF SYSTEMS: [name] is not feeling well today.  She was seen in ER on 9/7 and underwent Ct scan that demonstrated hydronephrosis and hydroureter of lower pole.  No obstructing stone identified, right hydronephrosis and hydroureter identified.  ? Stone, stricture, reflux or pyelonephritis.  Fatty liver was identified, and patient has undergone cholecystectomy and hysterectomy.  [name] has had elevated sugars in the 400s. We stopped her dexamethasone last week, and they are still elevated.  She admits that she isn't taking and hasn't taken her Metformin and Glipizide in a month or two.  She notes her axillary wound is opening up and she is noting increased fluid drainage from it.  She has seen Dr. Barry Dienes in follow up  She denies redness.  She does note shaking chills over the weekend.    [name] tells me that she followed up with her ID MD about her HIV and her medication was changed.  She says she just started it yesterday and is tolerating it well.  Otherwise [name] is feeling well and denies any other issues today.  A detailed ROS was otherwise non contributory.     PAST MEDICAL HISTORY: Past Medical History:  Diagnosis Date  . ANEMIA-IRON DEFICIENCY 01/30/2010  . ANXIETY 11/27/2007  . Arthritis  both knees  . Asthma 02/25/2011  . Cancer ([place])    breast  . Carbuncle and furuncle of trunk 04/16/2010  . Chlamydia infection 03/21/2008  . DIABETES MELLITUS, TYPE II  08/02/2007  . Edema 01/30/2010  . ELEVATED BLOOD PRESSURE WITHOUT DIAGNOSIS OF HYPERTENSION 08/02/2007  . Family history of breast cancer   . Family history of colon cancer   . Family history of lung cancer   . FREQUENCY, URINARY 05/19/2009  . GENITAL HERPES 03/12/2009  . HIV (human immunodeficiency virus infection) ([place] [facility]) 2009  . HIV INFECTION 03/12/2009  . HSV (herpes simplex virus) infection   . HYPERLIPIDEMIA 08/02/2007  . Hypertension   . Metrorrhagia 03/21/2008  . PONV (postoperative nausea and vomiting)   . RETENTION, URINE 05/19/2009  . Trichomonas infection 01/19/2010  . VITAMIN D DEFICIENCY 01/30/2010    PAST SURGICAL HISTORY: Past Surgical History:  Procedure Laterality Date  . ABDOMINAL HYSTERECTOMY  07/22/2010   TAH WITH PRESERVATION OF BOTH TUBES AND OVARIES  . BREAST LUMPECTOMY WITH RADIOACTIVE SEED AND SENTINEL LYMPH NODE BIOPSY Right 02/23/2018   Procedure: BREAST LUMPECTOMY WITH RADIOACTIVE SEED AND SENTINEL LYMPH NODE BIOPSY;  Surgeon: Stark Klein, MD;  Location: [facility];  Service: General;  Laterality: Right;  . CHOLECYSTECTOMY    . ENDOMETRIAL ABLATION  01/11/2008   HER OPTION  . ESOPHAGOGASTRODUODENOSCOPY    . MULTIPLE TOOTH EXTRACTIONS    . PORTACATH PLACEMENT Left 02/23/2018   Procedure: INSERTION PORT-A-CATH;  Surgeon: Stark Klein, MD;  Location: [facility];  Service: General;  Laterality: Left;  . TUBAL LIGATION    . WISDOM TOOTH EXTRACTION      FAMILY HISTORY Family History  Problem Relation Age of Onset  . Prostate cancer Other   . Heart disease Other   . Stroke Other   . Diabetes Mother   . Hypertension Mother   . Diabetes Father   . Cancer Father        COLON and LU NG  . Stroke Paternal Uncle   . Breast cancer Paternal Aunt   . Lung cancer Maternal Grandmother 60       Mesothelioma  . Colon cancer Paternal Grandfather        dx over 47s  . Lung cancer Paternal Aunt   . Breast cancer Paternal Aunt   . Breast cancer Paternal Aunt   . Heart attack  Maternal Grandfather 71  . Colon cancer Other        MGM's 5 brothers  The patient's father died at age 44 due to metastatic liver cancer. The patient's mother is alive at 38. The patient's has 1 brother and 3 sisters. There was a paternal grandfather with colon cancer. There were 3 paternal aunts with breast cancer, 2 diagnosed in the 32's and 1 at age 46. There was a maternal grandmother with mesothelioma. The patient otherwise denies a history of ovarian cancer in the family.    GYNECOLOGIC HISTORY:  Patient's last menstrual period was 06/21/2010. Menarche: 48 years old Age at first live birth: 47 years old She is GXP5. Her LMP was December 2011. She is status post partial hysterectomy without oophorectomy. She took oral contraception for 3 years with no complications. She never took HRT.    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  At home are 2 of her sons, Glendell Docker and [name], and the patient's grandson, Amador Cunas 45 (who is Willie's son). The patient's oldest is [place] age 54 who lives in [place] as a cook. Daughter,  [place] age 43 works as a Scientist, water quality. Son, Glendell Docker age 30 is disabled. Son, [name] age 41 lives in [place] as a Microbiologist. Daughter, Benard Rink age 26 also is a Microbiologist. The patient has 5 grandchildren and no great grandchildren. The patient does not belong to a church.        ADVANCED DIRECTIVES: Not in place; at the 01/18/2018 visit the patient was given the appropriate documents to complete on notarized at her discretion   HEALTH MAINTENANCE: Social History   Tobacco Use  . Smoking status: Never Smoker  . Smokeless tobacco: Never Used  Substance Use Topics  . Alcohol use: Yes    Alcohol/week: 0.0 standard drinks    Comment: OCCASIONALLY  . Drug use: No     Colonoscopy: Not yet  PAP: November 2018  Bone density: Never   Allergies  Allergen Reactions  . Levaquin [Levofloxacin In D5w] Nausea And Vomiting and Rash  . Penicillins Swelling and Rash    Has  patient had a PCN reaction causing immediate rash, facial/tongue/throat swelling, SOB or lightheadedness with hypotension: Yes Has patient had a PCN reaction causing severe rash involving mucus membranes or skin necrosis: No Has patient had a PCN reaction that required hospitalization: Yes Has patient had a PCN reaction occurring within the last 10 years: No If all of the above answers are "NO", then may proceed with Cephalosporin use.    Current Outpatient Medications  Medication Sig Dispense Refill  . albuterol (PROVENTIL HFA;VENTOLIN HFA) 108 (90 Base) MCG/ACT inhaler Inhale 2 puffs into the lungs every 6 (six) hours as needed for wheezing or shortness of breath.    Marland Kitchen aspirin 81 MG tablet Take 81 mg by mouth daily as needed (chest pain).     . Azelastine-Fluticasone (DYMISTA) 137-50 MCG/ACT SUSP Use as directed 1 spray each side twice per day as needed (Patient not taking: Reported on 04/01/2018) 23 g 5  . Beclomethasone Dipropionate (QNASL) 80 MCG/ACT AERS Place 2 sprays into the nose daily. (Patient not taking: Reported on 04/01/2018) 1 Inhaler 1  . blood glucose meter kit and supplies Dispense based on patient and insurance preference. Use up to four times daily as directed. (FOR ICD-10 E10.9, E11.9). 1 each 0  . dexamethasone (DECADRON) 4 MG tablet Take 2 tablets (8 mg total) by mouth 2 (two) times daily. Start the day before Taxotere. Take once the day after, then 2 times a day x 2d. 30 tablet 1  . Diclofenac Sodium (PENNSAID) 2 % SOLN Place 1 application onto the skin 2 (two) times daily. (Patient taking differently: Place 1 application onto the skin 2 (two) times daily as needed. ) 1 Bottle 3  . doxycycline (VIBRA-TABS) 100 MG tablet Take 1 tablet (100 mg total) by mouth 2 (two) times daily. 14 tablet 0  . Dulaglutide (TRULICITY) 5.64 PP/2.9JJ SOPN Inject 0.75 mg into the skin once a week. (Patient not taking: Reported on 04/01/2018) 6 mL 11  . elvitegravir-cobicistat-emtricitabine-tenofovir  (STRIBILD) 150-150-200-300 MG TABS tablet Take 1 tablet daily by mouth.    . famotidine (PEPCID) 20 MG tablet Take 1 tablet (20 mg total) by mouth 2 (two) times daily. 60 tablet 3  . ferrous gluconate (IRON 27) 240 (27 FE) MG tablet Take 480 mg by mouth daily.    Marland Kitchen glipiZIDE (GLIPIZIDE XL) 5 MG 24 hr tablet Take 1 tablet (5 mg total) by mouth daily with breakfast. (Patient not taking: Reported on 04/01/2018) 90 tablet 3  . hydrocortisone (ANUSOL-HC) 2.5 % rectal cream  Place 1 application rectally 2 (two) times daily. 30 g 0  . ibuprofen (ADVIL,MOTRIN) 800 MG tablet Take 1 tablet (800 mg total) by mouth every 8 (eight) hours as needed. (Patient taking differently: Take 800 mg by mouth daily as needed for moderate pain. ) 60 tablet 0  . lidocaine-prilocaine (EMLA) cream Apply to affected area once (Patient taking differently: Apply 1 application topically once. Apply to affected area once) 30 g 3  . LORazepam (ATIVAN) 0.5 MG tablet Take 1 tablet (0.5 mg total) by mouth at bedtime as needed (Nausea or vomiting). 30 tablet 0  . metFORMIN (GLUCOPHAGE-XR) 500 MG 24 hr tablet Take 2 tablets (1,000 mg total) by mouth daily with breakfast. Annual appt is due must see provider for future refills 180 tablet 3  . Multiple Vitamin (MULTIVITAMIN WITH MINERALS) TABS tablet Take 1 tablet by mouth daily.    . naproxen (NAPROSYN) 500 MG tablet Take 500 mg by mouth 2 (two) times daily as needed for moderate pain.    Marland Kitchen omeprazole (PRILOSEC) 40 MG capsule Take 1 capsule (40 mg total) by mouth daily. 30 capsule 0  . ondansetron (ZOFRAN) 8 MG tablet Take 1 tablet (8 mg total) by mouth every 8 (eight) hours as needed for nausea or vomiting. 20 tablet 0  . oxyCODONE (OXY IR/ROXICODONE) 5 MG immediate release tablet Take 1-2 tablets (5-10 mg total) by mouth every 6 (six) hours as needed for moderate pain, severe pain or breakthrough pain. 30 tablet 0  . prochlorperazine (COMPAZINE) 10 MG tablet Take 1 tablet (10 mg total) by  mouth every 6 (six) hours as needed (Nausea or vomiting). 30 tablet 1  . valACYclovir (VALTREX) 500 MG tablet Take twice daily for 3-5 days (Patient not taking: Reported on 04/01/2018) 30 tablet 12  . zolpidem (AMBIEN) 10 MG tablet Take 1 tablet (10 mg total) by mouth at bedtime as needed for sleep. 90 tablet 1   No current facility-administered medications for this visit.     OBJECTIVE: Vitals:   04/05/18 0934  BP: 97/65  Pulse: (!) 113  Resp: 18  Temp: 98 F (36.7 C)  SpO2: 100%     Body mass index is 43.97 kg/m.   Wt Readings from Last 3 Encounters:  04/05/18 240 lb 6.4 oz (109 kg)  03/30/18 252 lb (114.3 kg)  03/29/18 252 lb (114.3 kg)  ECOG FS:2 GENERAL: Patient is a well appearing female in no acute distress HEENT:  Sclerae anicteric.  Oropharynx clear and moist. No ulcerations or evidence of oropharyngeal candidiasis. Neck is supple.  NODES:  No cervical, supraclavicular, or axillary lymphadenopathy palpated.  BREAST EXAM: axillary wound appears to beginning to open, continued serous drainage noted LUNGS:  Clear to auscultation bilaterally.  No wheezes or rhonchi. HEART:  Regular rate and rhythm. No murmur appreciated. ABDOMEN:  Soft, nontender.  Positive, normoactive bowel sounds. No organomegaly palpated. MSK:  No focal spinal tenderness to palpation. Full range of motion bilaterally in the upper extremities. EXTREMITIES:  No peripheral edema.   SKIN:  Clear with no obvious rashes or skin changes. No nail dyscrasia.  NEURO:  Nonfocal. Well oriented.  Appropriate affect.     LAB RESULTS:  CMP     Component Value Date/Time   NA 132 (L) 04/05/2018 0850   K 3.2 (L) 04/05/2018 0850   CL 90 (L) 04/05/2018 0850   CO2 27 04/05/2018 0850   GLUCOSE 419 (H) 04/05/2018 0850   BUN 9 04/05/2018 0850   CREATININE 1.30 (  H) 04/05/2018 0850   CALCIUM 9.4 04/05/2018 0850   PROT 7.3 04/05/2018 0850   ALBUMIN 3.6 04/05/2018 0850   AST 48 (H) 04/05/2018 0850   ALT 81 (H)  04/05/2018 0850   ALKPHOS 229 (H) 04/05/2018 0850   BILITOT 0.6 04/05/2018 0850   GFRNONAA 47 (L) 04/05/2018 0850   GFRAA 55 (L) 04/05/2018 0850    No results found for: TOTALPROTELP, ALBUMINELP, A1GS, A2GS, BETS, BETA2SER, GAMS, MSPIKE, SPEI  No results found for: KPAFRELGTCHN, LAMBDASER, KAPLAMBRATIO  Lab Results  Component Value Date   WBC 2.6 (L) 04/05/2018   NEUTROABS 0.7 (L) 04/05/2018   HGB 11.7 04/05/2018   HCT 35.0 04/05/2018   MCV 83.5 04/05/2018   PLT 126 (L) 04/05/2018    @LASTCHEMISTRY @  No results found for: LABCA2  No components found for: STMHDQ222  No results for input(s): INR in the last 168 hours.  No results found for: LABCA2  No results found for: LNL892  No results found for: JJH417  No results found for: EYC144  No results found for: CA2729  No components found for: HGQUANT  No results found for: CEA1 / No results found for: CEA1   No results found for: AFPTUMOR  No results found for: CHROMOGRNA  No results found for: PSA1  Appointment on 04/05/2018  Component Date Value Ref Range Status  . Sodium 04/05/2018 132* 135 - 145 mmol/L Final  . Potassium 04/05/2018 3.2* 3.5 - 5.1 mmol/L Final  . Chloride 04/05/2018 90* 98 - 111 mmol/L Final  . CO2 04/05/2018 27  22 - 32 mmol/L Final  . Glucose, Bld 04/05/2018 419* 70 - 99 mg/dL Final  . BUN 04/05/2018 9  6 - 20 mg/dL Final  . Creatinine 04/05/2018 1.30* 0.44 - 1.00 mg/dL Final  . Calcium 04/05/2018 9.4  8.9 - 10.3 mg/dL Final  . Total Protein 04/05/2018 7.3  6.5 - 8.1 g/dL Final  . Albumin 04/05/2018 3.6  3.5 - 5.0 g/dL Final  . AST 04/05/2018 48* 15 - 41 U/L Final  . ALT 04/05/2018 81* 0 - 44 U/L Final  . Alkaline Phosphatase 04/05/2018 229* 38 - 126 U/L Final  . Total Bilirubin 04/05/2018 0.6  0.3 - 1.2 mg/dL Final  . GFR, Est Non Af Am 04/05/2018 47* >60 mL/min Final  . GFR, Est AFR Am 04/05/2018 55* >60 mL/min Final   Comment: (NOTE) The eGFR has been calculated using the CKD  EPI equation. This calculation has not been validated in all clinical situations. eGFR's persistently <60 mL/min signify possible Chronic Kidney Disease.   Georgiann Hahn gap 04/05/2018 15  5 - 15 Final   Performed at [facility], [place] [address] [place], [place] [address]  . WBC Count 04/05/2018 2.6* 3.9 - 10.3 K/uL Final  . RBC 04/05/2018 4.19  3.70 - 5.45 MIL/uL Final  . Hemoglobin 04/05/2018 11.7  11.6 - 15.9 g/dL Final  . HCT 04/05/2018 35.0  34.8 - 46.6 % Final  . MCV 04/05/2018 83.5  79.5 - 101.0 fL Final  . MCH 04/05/2018 27.8  25.1 - 34.0 pg Final  . MCHC 04/05/2018 33.3  31.5 - 36.0 g/dL Final  . RDW 04/05/2018 14.0  11.2 - 14.5 % Final  . Platelet Count 04/05/2018 126* 145 - 400 K/uL Final  . Neutrophils Relative % 04/05/2018 26  % Final  . Neutro Abs 04/05/2018 0.7* 1.5 - 6.5 K/uL Final  . Lymphocytes Relative 04/05/2018 52  % Final  . Lymphs Abs  04/05/2018 1.4  0.9 - 3.3 K/uL Final  . Monocytes Relative 04/05/2018 21  % Final  . Monocytes Absolute 04/05/2018 0.6  0.1 - 0.9 K/uL Final  . Eosinophils Relative 04/05/2018 1  % Final  . Eosinophils Absolute 04/05/2018 0.0  0.0 - 0.5 K/uL Final  . Basophils Relative 04/05/2018 0  % Final  . Basophils Absolute 04/05/2018 0.0  0.0 - 0.1 K/uL Final   Performed at [facility], [address] [place], [place] [address]    (this displays the last labs from the last 3 days)  No results found for: TOTALPROTELP, ALBUMINELP, A1GS, A2GS, BETS, BETA2SER, GAMS, MSPIKE, SPEI (this displays SPEP labs)  No results found for: KPAFRELGTCHN, LAMBDASER, KAPLAMBRATIO (kappa/lambda light chains)  No results found for: HGBA, HGBA2QUANT, HGBFQUANT, HGBSQUAN (Hemoglobinopathy evaluation)   No results found for: LDH  Lab Results  Component Value Date   IRON 35 (L) 01/30/2010   IRONPCTSAT 8.8 (L) 01/30/2010   (Iron and TIBC)  No results found for: FERRITIN  Urinalysis    Component Value  Date/Time   COLORURINE RED (A) 04/01/2018 0202   APPEARANCEUR HAZY (A) 04/01/2018 0202   LABSPEC 1.021 04/01/2018 0202   PHURINE 6.0 04/01/2018 0202   GLUCOSEU >=500 (A) 04/01/2018 0202   GLUCOSEU NEGATIVE 09/27/2017 1002   HGBUR LARGE (A) 04/01/2018 0202   BILIRUBINUR NEGATIVE 04/01/2018 0202   KETONESUR 5 (A) 04/01/2018 0202   PROTEINUR 100 (A) 04/01/2018 0202   UROBILINOGEN 0.2 09/27/2017 1002   NITRITE NEGATIVE 04/01/2018 0202   LEUKOCYTESUR NEGATIVE 04/01/2018 0202     STUDIES: Ct Abdomen Pelvis W Contrast  Result Date: 04/01/2018 CLINICAL DATA:  Abdominal cramping. Hematuria. Breast cancer patient with last chemotherapy on Wednesday. EXAM: CT ABDOMEN AND PELVIS WITH CONTRAST TECHNIQUE: Multidetector CT imaging of the abdomen and pelvis was performed using the standard protocol following bolus administration of intravenous contrast. CONTRAST:  159m ISOVUE-300 IOPAMIDOL (ISOVUE-300) INJECTION 61% COMPARISON:  None. FINDINGS: Lower chest: Mild dependent changes in the lung bases. Hepatobiliary: Mild diffuse fatty infiltration of the liver. No focal liver abnormality is seen. Status post cholecystectomy. No biliary dilatation. Pancreas: Unremarkable. No pancreatic ductal dilatation or surrounding inflammatory changes. Spleen: Normal in size without focal abnormality. Adrenals/Urinary Tract: No adrenal gland nodules. Subcentimeter cyst in the left kidney. Kidneys are symmetrical in size. There is mild right hydronephrosis and hydroureter. No obstructing ureteral stone is identified. This could indicate a non radiopaque stone, stricture, reflux or pyelonephritis. 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 The lower pole moiety in a duplicated system is susceptible to reflux. The bladder is unremarkable. Stomach/Bowel: Stomach is within normal limits. Appendix appears normal. No evidence of bowel wall thickening, distention, or inflammatory  changes. Vascular/Lymphatic: No significant vascular findings are present. No enlarged abdominal or pelvic lymph nodes. Reproductive: Status post hysterectomy. No adnexal masses. Other: No abdominal wall hernia or abnormality. No abdominopelvic ascites. Musculoskeletal: No acute or significant osseous findings. IMPRESSION: 1. 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 No obstructing ureteral stone is identified. This could indicate a non radiopaque stone, stricture, reflux or pyelonephritis. The lower pole moiety in a duplicated system is susceptible to reflux. 2. Mild diffuse fatty infiltration of the liver. 3. Status post cholecystectomy and hysterectomy. Electronically Signed   By: WLucienne CapersM.D.   On: 04/01/2018 06:34    ELIGIBLE FOR AVAILABLE RESEARCH PROTOCOL: no  ASSESSMENT:  47 y.o. [place], [name] woman status post right breast upper outer quadrant biopsy 01/11/2018 for a clinical T1 N0, stage IA invasive ductal carcinoma, grade 3, estrogen and progesterone receptor positive, HER-2 amplified, with an MIB-1 of 30%.  (1) status post right lumpectomy and sentinel lymph node sampling 02/23/2018 for a pT2 pN1, stage IB invasive ductal carcinoma, grade 3, with close but negative margins  (2) adjuvant chemotherapy consisting of carboplatin, docetaxel, trastuzumab and Pertuzumab every 21 days x 6 to start 03/08/2018  (3) anti-HER-2 immunotherapy to start concurrently with chemotherapy  (a) baseline echocardiogram on 02/07/2018 showed an ejection fraction in the 55-60% range  (4) adjuvant radiation as appropriate  (5) antiestrogens to follow at the completion of local treatment  (6) genetics testing through [facility] Multi-cancer and Breast panel on 01/13/2018 showed: no deleterious mutations. The following genes were evaluated for sequence changes and exonic deletions/duplications: ALK, APC, ATM, AXIN2, BAP1, BARD1, BLM, BMPR1A,  BRCA1, BRCA2, BRIP1, CASR, CDC73, CDH1, CDK4, CDKN1B, CDKN1C,CDKN2A (p14ARF), CDKN2A (p16INK4a), CEBPA, CHEK2, CTNNA1, DICER1, DIS3L2, EPCAM*, FH, FLCN, GATA2, GPC3, GREM1*, HRAS, KIT, MAX, MEN1, MET, MLH1, MSH2, MSH3, MSH6, MUTYH, NBN, NF1, NF2, PALB2, PDGFRA, PHOX2B*, PMS2, POLD1,POLE, POT1, PRKAR1A, PTCH1, PTEN, RAD50, RAD51C, RAD51D, RB1, RECQL4, RET, RUNX1, SDHAF2, SDHB, SDHC, SDHD,SMAD4, SMARCA4, SMARCB1, SMARCE1, STK11, SUFU, TERC, TERT, TMEM127, TP53, TSC1, TSC2, VHL, WRN*, WT1.The following genes were evaluated for sequence changes only: EGFR*, HOXB13*, MITF*, NTHL1*, SDHA  PLAN: Lynnmarie is not feeling well today.  Her kidney function is increased, she has a decreased potassium, and she is neutropenic.  She will need the following things.  1. Dehydration and hypokalemia:  IV fluids today NS with 43mq of KCL, return tomorrow for IV fluids and recheck. 2. Neutropenia: Cipro BID x 5 days, neutropenic precautions reviewed in detail. 3. Kidney issues: will expedite urology appointment 4. Right Axillary wound-will send Dr. BBarry Dienesa message 5. Breast cancer: patient will continue chemotherapy.  I did review the change from Docetaxel to Gemcitabine in detail.  We reviewed potential adverse effects of Gemcitabine as well.   6. Neuropathy: very early, will monitor, docetaxel has been removed from chemotherapy regimen.    Due to MSaraefeeling so poorly I recommended that she return tomorrow for labs, IV fluids, and I will check in with her.  The above was reviewed with Dr. mJana Hakimin detail.  He is in agreement with above plan and helped to formulate it.   A total of (30) minutes of face-to-face time was spent with this patient with greater than 50% of that time in counseling and care-coordination.   LWilber Bihari NP  04/05/18 9:46 AM Medical Oncology and Hematology [id number] [id number]. [phone]   Fax. [phone]

## 2018-04-05 NOTE — Patient Instructions (Signed)
Hypokalemia Hypokalemia means that the amount of potassium in the blood is lower than normal.Potassium is a chemical that helps regulate the amount of fluid in the body (electrolyte). It also stimulates muscle tightening (contraction) and helps nerves work properly.Normally, most of the body's potassium is inside of cells, and only a very small amount is in the blood. Because the amount in the blood is so small, minor changes to potassium levels in the blood can be life-threatening. What are the causes? This condition may be caused by:  Antibiotic medicine.  Diarrhea or vomiting. Taking too much of a medicine that helps you have a bowel movement (laxative) can cause diarrhea and lead to hypokalemia.  Chronic kidney disease (CKD).  Medicines that help the body get rid of excess fluid (diuretics).  Eating disorders, such as bulimia.  Low magnesium levels in the body.  Sweating a lot.  What are the signs or symptoms? Symptoms of this condition include:  Weakness.  Constipation.  Fatigue.  Muscle cramps.  Mental confusion.  Skipped heartbeats or irregular heartbeat (palpitations).  Tingling or numbness.  How is this diagnosed? This condition is diagnosed with a blood test. How is this treated? Hypokalemia can be treated by taking potassium supplements by mouth or adjusting the medicines that you take. Treatment may also include eating more foods that contain a lot of potassium. If your potassium level is very low, you may need to get potassium through an IV tube in one of your veins and be monitored in the hospital. Follow these instructions at home:  Take over-the-counter and prescription medicines only as told by your health care provider. This includes vitamins and supplements.  Eat a healthy diet. A healthy diet includes fresh fruits and vegetables, whole grains, healthy fats, and lean proteins.  If instructed, eat more foods that contain a lot of potassium, such  as: ? Nuts, such as peanuts and pistachios. ? Seeds, such as sunflower seeds and pumpkin seeds. ? Peas, lentils, and lima beans. ? Whole grain and bran cereals and breads. ? Fresh fruits and vegetables, such as apricots, avocado, bananas, cantaloupe, kiwi, oranges, tomatoes, asparagus, and potatoes. ? Orange juice. ? Tomato juice. ? Red meats. ? Yogurt.  Keep all follow-up visits as told by your health care provider. This is important. Contact a health care provider if:  You have weakness that gets worse.  You feel your heart pounding or racing.  You vomit.  You have diarrhea.  You have diabetes (diabetes mellitus) and you have trouble keeping your blood sugar (glucose) in your target range. Get help right away if:  You have chest pain.  You have shortness of breath.  You have vomiting or diarrhea that lasts for more than 2 days.  You faint. This information is not intended to replace advice given to you by your health care provider. Make sure you discuss any questions you have with your health care provider. Document Released: 07/12/2005 Document Revised: 02/28/2016 Document Reviewed: 02/28/2016 [employer] Interactive Patient Education  2018 [employer]   Dehydration, Adult Dehydration is when there is not enough fluid or water in your body. This happens when you lose more fluids than you take in. Dehydration can range from mild to very bad. It should be treated right away to keep it from getting very bad. Symptoms of mild dehydration may include:  Thirst.  Dry lips.  Slightly dry mouth.  Dry, warm skin.  Dizziness. Symptoms of moderate dehydration may include:  Very dry mouth.  Muscle   cramps.  Dark pee (urine). Pee may be the color of tea.  Your body making less pee.  Your eyes making fewer tears.  Heartbeat that is uneven or faster than normal (palpitations).  Headache.  Light-headedness, especially when you stand up from sitting.  Fainting  (syncope). Symptoms of very bad dehydration may include:  Changes in skin, such as: ? Cold and clammy skin. ? Blotchy (mottled) or pale skin. ? Skin that does not quickly return to normal after being lightly pinched and let go (poor skin turgor).  Changes in body fluids, such as: ? Feeling very thirsty. ? Your eyes making fewer tears. ? Not sweating when body temperature is high, such as in hot weather. ? Your body making very little pee.  Changes in vital signs, such as: ? Weak pulse. ? Pulse that is more than 100 beats a minute when you are sitting still. ? Fast breathing. ? Low blood pressure.  Other changes, such as: ? Sunken eyes. ? Cold hands and feet. ? Confusion. ? Lack of energy (lethargy). ? Trouble waking up from sleep. ? Short-term weight loss. ? Unconsciousness. Follow these instructions at home:  If told by your doctor, drink an ORS: ? Make an ORS by using instructions on the package. ? Start by drinking small amounts, about  cup (120 mL) every 5-10 minutes. ? Slowly drink more until you have had the amount that your doctor said to have.  Drink enough clear fluid to keep your pee clear or pale yellow. If you were told to drink an ORS, finish the ORS first, then start slowly drinking clear fluids. Drink fluids such as: ? Water. Do not drink only water by itself. Doing that can make the salt (sodium) level in your body get too low (hyponatremia). ? Ice chips. ? Fruit juice that you have added water to (diluted). ? Low-calorie sports drinks.  Avoid: ? Alcohol. ? Drinks that have a lot of sugar. These include high-calorie sports drinks, fruit juice that does not have water added, and soda. ? Caffeine. ? Foods that are greasy or have a lot of fat or sugar.  Take over-the-counter and prescription medicines only as told by your doctor.  Do not take salt tablets. Doing that can make the salt level in your body get too high (hypernatremia).  Eat foods that  have minerals (electrolytes). Examples include bananas, oranges, potatoes, tomatoes, and spinach.  Keep all follow-up visits as told by your doctor. This is important. Contact a doctor if:  You have belly (abdominal) pain that: ? Gets worse. ? Stays in one area (localizes).  You have a rash.  You have a stiff neck.  You get angry or annoyed more easily than normal (irritability).  You are more sleepy than normal.  You have a harder time waking up than normal.  You feel: ? Weak. ? Dizzy. ? Very thirsty.  You have peed (urinated) only a small amount of very dark pee during 6-8 hours. Get help right away if:  You have symptoms of very bad dehydration.  You cannot drink fluids without throwing up (vomiting).  Your symptoms get worse with treatment.  You have a fever.  You have a very bad headache.  You are throwing up or having watery poop (diarrhea) and it: ? Gets worse. ? Does not go away.  You have blood or something green (bile) in your throw-up.  You have blood in your poop (stool). This may cause poop to look black and tarry.    You have not peed in 6-8 hours.  You pass out (faint).  Your heart rate when you are sitting still is more than 100 beats a minute.  You have trouble breathing. This information is not intended to replace advice given to you by your health care provider. Make sure you discuss any questions you have with your health care provider. Document Released: 05/08/2009 Document Revised: 01/30/2016 Document Reviewed: 09/05/2015 [employer] Interactive Patient Education  2018 [employer]  

## 2018-04-06 ENCOUNTER — Other Ambulatory Visit: Payer: Self-pay | Admitting: *Deleted

## 2018-04-06 ENCOUNTER — Inpatient Hospital Stay (HOSPITAL_BASED_OUTPATIENT_CLINIC_OR_DEPARTMENT_OTHER): Payer: BC Managed Care – PPO | Admitting: Adult Health

## 2018-04-06 ENCOUNTER — Telehealth: Payer: Self-pay | Admitting: Adult Health

## 2018-04-06 ENCOUNTER — Inpatient Hospital Stay: Payer: BC Managed Care – PPO

## 2018-04-06 ENCOUNTER — Encounter: Payer: Self-pay | Admitting: Adult Health

## 2018-04-06 ENCOUNTER — Other Ambulatory Visit: Payer: Self-pay | Admitting: Oncology

## 2018-04-06 ENCOUNTER — Other Ambulatory Visit: Payer: Self-pay | Admitting: Adult Health

## 2018-04-06 VITALS — BP 139/89 | HR 118 | Temp 97.6°F | Resp 18

## 2018-04-06 DIAGNOSIS — Z95828 Presence of other vascular implants and grafts: Secondary | ICD-10-CM

## 2018-04-06 DIAGNOSIS — C50411 Malignant neoplasm of upper-outer quadrant of right female breast: Secondary | ICD-10-CM

## 2018-04-06 DIAGNOSIS — I10 Essential (primary) hypertension: Secondary | ICD-10-CM | POA: Diagnosis not present

## 2018-04-06 DIAGNOSIS — C50911 Malignant neoplasm of unspecified site of right female breast: Secondary | ICD-10-CM

## 2018-04-06 DIAGNOSIS — E1165 Type 2 diabetes mellitus with hyperglycemia: Secondary | ICD-10-CM

## 2018-04-06 DIAGNOSIS — Z170 Estrogen receptor positive status [ER+]: Secondary | ICD-10-CM

## 2018-04-06 DIAGNOSIS — R739 Hyperglycemia, unspecified: Secondary | ICD-10-CM

## 2018-04-06 DIAGNOSIS — N200 Calculus of kidney: Secondary | ICD-10-CM

## 2018-04-06 DIAGNOSIS — C773 Secondary and unspecified malignant neoplasm of axilla and upper limb lymph nodes: Secondary | ICD-10-CM

## 2018-04-06 DIAGNOSIS — E876 Hypokalemia: Secondary | ICD-10-CM

## 2018-04-06 DIAGNOSIS — R197 Diarrhea, unspecified: Secondary | ICD-10-CM

## 2018-04-06 DIAGNOSIS — Z5112 Encounter for antineoplastic immunotherapy: Secondary | ICD-10-CM | POA: Diagnosis not present

## 2018-04-06 LAB — CMP (CANCER CENTER ONLY)
ALT: 65 U/L — ABNORMAL HIGH (ref 0–44)
AST: 45 U/L — ABNORMAL HIGH (ref 15–41)
Albumin: 3.4 g/dL — ABNORMAL LOW (ref 3.5–5.0)
Alkaline Phosphatase: 252 U/L — ABNORMAL HIGH (ref 38–126)
Anion gap: 12 (ref 5–15)
BUN: 7 mg/dL (ref 6–20)
CO2: 27 mmol/L (ref 22–32)
Calcium: 8.9 mg/dL (ref 8.9–10.3)
Chloride: 94 mmol/L — ABNORMAL LOW (ref 98–111)
Creatinine: 1.2 mg/dL — ABNORMAL HIGH (ref 0.44–1.00)
GFR, Est AFR Am: >60 mL/min (ref >60)
GFR, Estimated: 52 mL/min — ABNORMAL LOW (ref >60)
Glucose, Bld: 390 mg/dL — ABNORMAL HIGH (ref 70–99)
Potassium: 3.1 mmol/L — ABNORMAL LOW (ref 3.5–5.1)
Sodium: 133 mmol/L — ABNORMAL LOW (ref 135–145)
Total Bilirubin: 0.5 mg/dL (ref 0.3–1.2)
Total Protein: 7 g/dL (ref 6.5–8.1)

## 2018-04-06 LAB — CBC WITH DIFFERENTIAL (CANCER CENTER ONLY)
Basophils Absolute: 0.1 10*3/uL (ref 0.0–0.1)
Basophils Relative: 1 %
Eosinophils Absolute: 0 10*3/uL (ref 0.0–0.5)
Eosinophils Relative: 0 %
HCT: 34.3 % — ABNORMAL LOW (ref 34.8–46.6)
Hemoglobin: 11.1 g/dL — ABNORMAL LOW (ref 11.6–15.9)
Lymphocytes Relative: 24 %
Lymphs Abs: 2.8 10*3/uL (ref 0.9–3.3)
MCH: 27.3 pg (ref 25.1–34.0)
MCHC: 32.4 g/dL (ref 31.5–36.0)
MCV: 84.5 fL (ref 79.5–101.0)
Monocytes Absolute: 1.8 10*3/uL — ABNORMAL HIGH (ref 0.1–0.9)
Monocytes Relative: 16 %
Neutro Abs: 6.8 10*3/uL — ABNORMAL HIGH (ref 1.5–6.5)
Neutrophils Relative %: 59 %
Platelet Count: 156 10*3/uL (ref 145–400)
RBC: 4.06 MIL/uL (ref 3.70–5.45)
RDW: 14.1 % (ref 11.2–14.5)
WBC Count: 11.4 10*3/uL — ABNORMAL HIGH (ref 3.9–10.3)

## 2018-04-06 MED ORDER — ALTEPLASE 2 MG IJ SOLR
2.0000 mg | Freq: Once | INTRAMUSCULAR | Status: AC
  Administered 2018-04-06: 2 mg
  Filled 2018-04-06: qty 2

## 2018-04-06 MED ORDER — SODIUM CHLORIDE 0.9% FLUSH
10.0000 mL | Freq: Once | INTRAVENOUS | Status: AC
  Administered 2018-04-06: 10 mL
  Filled 2018-04-06: qty 10

## 2018-04-06 MED ORDER — SODIUM CHLORIDE 0.9 % IV SOLN
Freq: Once | INTRAVENOUS | Status: AC
  Administered 2018-04-06: 11:00:00 via INTRAVENOUS
  Filled 2018-04-06: qty 250

## 2018-04-06 MED ORDER — POTASSIUM CHLORIDE ER 10 MEQ PO TBCR: 20.0000 meq | EXTENDED_RELEASE_TABLET | Freq: Two times a day (BID) | ORAL | 0 refills | Status: DC

## 2018-04-06 MED ORDER — HEPARIN SOD (PORK) LOCK FLUSH 100 UNIT/ML IV SOLN
500.0000 [IU] | Freq: Once | INTRAVENOUS | Status: AC
  Administered 2018-04-06: 500 [IU]
  Filled 2018-04-06: qty 5

## 2018-04-06 MED ORDER — INSULIN REGULAR HUMAN 100 UNIT/ML IJ SOLN
10.0000 [IU] | Freq: Once | INTRAMUSCULAR | Status: AC
  Administered 2018-04-06: 10 [IU] via SUBCUTANEOUS
  Filled 2018-04-06: qty 0.1

## 2018-04-06 MED ORDER — ALTEPLASE 2 MG IJ SOLR
INTRAMUSCULAR | Status: AC
  Filled 2018-04-06: qty 2

## 2018-04-06 NOTE — Progress Notes (Signed)
[place]  [phone] [phone]     ID: Kelly Rowe DOB: [birth year]  MR#: [id number]  DXA#:[id number]  Patient Care Team: Biagio Borg, MD as PCP - [name], MD as Consulting Physician (General Surgery) [name], [name], MD as Consulting Physician (Oncology) [name], MD as Attending Physician (Radiation Oncology) [name], NP as Nurse Practitioner (Obstetrics and Gynecology) Marlinda Mike, PA-C as Referring Physician (Physician Assistant) OTHER MD:   CHIEF COMPLAINT: Triple positive breast cancer  CURRENT TREATMENT: adjuvant chemotherapy   HISTORY OF CURRENT ILLNESS: From the original intake note:  "Kelly Rowe" noticed bruising in her lateral right breast and palpated a mass  on 12/23/2017. She followed up with her gynecologist. She underwent unilateral right diagnostic mammography with tomography and right breast ultrasonography at [facility] on 01/05/2018 showing: breast density category B. There is an irregular highly suspicious mass within the right breast at the 9:30 o'clock upper outer quadrant, measuring 1.8 x 1.1 x 1.5 cm, and located 18 cm from the nipple,  Ultrasonography revealed a single morphologically abnormal lymph node in the RIGHT axilla, with cortical thickness of 6 mm.   Accordingly on 01/11/2018 she proceeded to biopsy of the right breast area in question as well as a suspicious lymph node. The pathology from this procedure showed ([id number]): Invasive ductal carcinoma grade III.  The lymph node biopsied was negative for carcinoma (concordant).. Prognostic indicators significant for: estrogen receptor, 60% positive with weak staining intensity and progesterone receptor, 10% positive with strong staining intensity. Proliferation marker Ki67 at 30%. HER2 amplified with ratios HER2/CEP17 signals 2.26 and average HER2 copies per cell 3.50  The patient's subsequent history is as detailed  below.  INTERVAL HISTORY: Kelly Rowe returns today for follow up and treatment of her triple positive breast cancer accompanied by her mother. She is receiving neoadjuvant chemotherapy with Docetaxel, Carboplatin, Trastuzumab, Pertuzumab given on day 1 of a 21 day cycle.  She receives growth factor support on day 3 with Udenyca.  Pertuzumab was removed after cycle 1 due to diarrhea.  Today is cycle 2 day 8.  The Docetaxel will be replaced with Gemcitabine after cycle 2 due to uncontrolled hyperglycemia from dexamethasone.  REVIEW OF SYSTEMS: Kelly Rowe is feeling slightly better today than she did yesterday.  She notes that she has continued to have increased bowel movements.  She had three this morning.  They are mushy, and soft.  They are not watery and do not have blood, mucous, pus.  She has not yet picked up her cipro.  She is eating and drinking well.  She is taking imodium for diarrhea, and hasn't taken any yet today.     PAST MEDICAL HISTORY: Past Medical History:  Diagnosis Date  . ANEMIA-IRON DEFICIENCY 01/30/2010  . ANXIETY 11/27/2007  . Arthritis    both knees  . Asthma 02/25/2011  . Cancer ([place])    breast  . Carbuncle and furuncle of trunk 04/16/2010  . Chlamydia infection 03/21/2008  . DIABETES MELLITUS, TYPE II 08/02/2007  . Edema 01/30/2010  . ELEVATED BLOOD PRESSURE WITHOUT DIAGNOSIS OF HYPERTENSION 08/02/2007  . Family history of breast cancer   . Family history of colon cancer   . Family history of lung cancer   . FREQUENCY, URINARY 05/19/2009  . GENITAL HERPES 03/12/2009  . HIV (human immunodeficiency virus infection) ([facility]) 2009  . HIV INFECTION 03/12/2009  . HSV (herpes simplex virus) infection   . HYPERLIPIDEMIA 08/02/2007  . Hypertension   .  Metrorrhagia 03/21/2008  . PONV (postoperative nausea and vomiting)   . RETENTION, URINE 05/19/2009  . Trichomonas infection 01/19/2010  . VITAMIN D DEFICIENCY 01/30/2010    PAST SURGICAL HISTORY: Past Surgical History:  Procedure  Laterality Date  . ABDOMINAL HYSTERECTOMY  07/22/2010   TAH WITH PRESERVATION OF BOTH TUBES AND OVARIES  . BREAST LUMPECTOMY WITH RADIOACTIVE SEED AND SENTINEL LYMPH NODE BIOPSY Right 02/23/2018   Procedure: BREAST LUMPECTOMY WITH RADIOACTIVE SEED AND SENTINEL LYMPH NODE BIOPSY;  Surgeon: Stark Klein, MD;  Location: [facility];  Service: General;  Laterality: Right;  . CHOLECYSTECTOMY    . ENDOMETRIAL ABLATION  01/11/2008   HER OPTION  . ESOPHAGOGASTRODUODENOSCOPY    . MULTIPLE TOOTH EXTRACTIONS    . PORTACATH PLACEMENT Left 02/23/2018   Procedure: INSERTION PORT-A-CATH;  Surgeon: Stark Klein, MD;  Location: [facility];  Service: General;  Laterality: Left;  . TUBAL LIGATION    . WISDOM TOOTH EXTRACTION      FAMILY HISTORY Family History  Problem Relation Age of Onset  . Prostate cancer Other   . Heart disease Other   . Stroke Other   . Diabetes Mother   . Hypertension Mother   . Diabetes Father   . Cancer Father        COLON and LU NG  . Stroke Paternal Uncle   . Breast cancer Paternal Aunt   . Lung cancer Maternal Grandmother 68       Mesothelioma  . Colon cancer Paternal Grandfather        dx over 55s  . Lung cancer Paternal Aunt   . Breast cancer Paternal Aunt   . Breast cancer Paternal Aunt   . Heart attack Maternal Grandfather 71  . Colon cancer Other        MGM's 5 brothers  The patient's father died at age 43 due to metastatic liver cancer. The patient's mother is alive at 55. The patient's has 1 brother and 3 sisters. There was a paternal grandfather with colon cancer. There were 3 paternal aunts with breast cancer, 2 diagnosed in the 95's and 1 at age 42. There was a maternal grandmother with mesothelioma. The patient otherwise denies a history of ovarian cancer in the family.    GYNECOLOGIC HISTORY:  Patient's last menstrual period was 06/21/2010. Menarche: 47 years old Age at first live birth: 47 years old She is GXP5. Her LMP was December 2011. She is status post  partial hysterectomy without oophorectomy. She took oral contraception for 3 years with no complications. She never took HRT.    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  At home are 2 of her sons, Kelly Rowe and Kelly Rowe, and the patient's grandson, Kelly Rowe 22 (who is Willie's son). The patient's oldest is Kelly Rowe age 77 who lives in [place] as a cook. Daughter, Kelly Rowe age 99 works as a Scientist, water quality. Son, Kelly Rowe age 61 is disabled. Son, Kelly Rowe age 79 lives in [place] as a Microbiologist. Daughter, Kelly Rowe age 71 also is a Microbiologist. The patient has 5 grandchildren and no great grandchildren. The patient does not belong to a church.        ADVANCED DIRECTIVES: Not in place; at the 01/18/2018 visit the patient was given the appropriate documents to complete on notarized at her discretion   HEALTH MAINTENANCE: Social History   Tobacco Use  . Smoking status: Never Smoker  . Smokeless tobacco: Never Used  Substance Use Topics  . Alcohol use: Yes    Alcohol/week: 0.0  standard drinks    Comment: OCCASIONALLY  . Drug use: No     Colonoscopy: Not yet  PAP: November 2018  Bone density: Never   Allergies  Allergen Reactions  . Levaquin [Levofloxacin In D5w] Nausea And Vomiting and Rash  . Penicillins Swelling and Rash    Has patient had a PCN reaction causing immediate rash, facial/tongue/throat swelling, SOB or lightheadedness with hypotension: Yes Has patient had a PCN reaction causing severe rash involving mucus membranes or skin necrosis: No Has patient had a PCN reaction that required hospitalization: Yes Has patient had a PCN reaction occurring within the last 10 years: No If all of the above answers are "NO", then may proceed with Cephalosporin use.    Current Outpatient Medications  Medication Sig Dispense Refill  . albuterol (PROVENTIL HFA;VENTOLIN HFA) 108 (90 Base) MCG/ACT inhaler Inhale 2 puffs into the lungs every 6 (six) hours as needed for wheezing or shortness of breath.     Marland Kitchen aspirin 81 MG tablet Take 81 mg by mouth daily as needed (chest pain).     . Azelastine-Fluticasone (DYMISTA) 137-50 MCG/ACT SUSP Use as directed 1 spray each side twice per day as needed 23 g 5  . Beclomethasone Dipropionate (QNASL) 80 MCG/ACT AERS Place 2 sprays into the nose daily. 1 Inhaler 1  . blood glucose meter kit and supplies Dispense based on patient and insurance preference. Use up to four times daily as directed. (FOR ICD-10 E10.9, E11.9). 1 each 0  . dexamethasone (DECADRON) 4 MG tablet Take 2 tablets (8 mg total) by mouth 2 (two) times daily. Start the day before Taxotere. Take once the day after, then 2 times a day x 2d. 30 tablet 1  . Diclofenac Sodium (PENNSAID) 2 % SOLN Place 1 application onto the skin 2 (two) times daily. (Patient taking differently: Place 1 application onto the skin 2 (two) times daily as needed. ) 1 Bottle 3  . doxycycline (VIBRA-TABS) 100 MG tablet Take 1 tablet (100 mg total) by mouth 2 (two) times daily. 14 tablet 0  . Dulaglutide (TRULICITY) 2.37 SE/8.3TD SOPN Inject 0.75 mg into the skin once a week. 6 mL 11  . elvitegravir-cobicistat-emtricitabine-tenofovir (STRIBILD) 150-150-200-300 MG TABS tablet Take 1 tablet daily by mouth.    . famotidine (PEPCID) 20 MG tablet Take 1 tablet (20 mg total) by mouth 2 (two) times daily. 60 tablet 3  . ferrous gluconate (IRON 27) 240 (27 FE) MG tablet Take 480 mg by mouth daily.    Marland Kitchen glipiZIDE (GLIPIZIDE XL) 5 MG 24 hr tablet Take 1 tablet (5 mg total) by mouth daily with breakfast. 90 tablet 3  . hydrocortisone (ANUSOL-HC) 2.5 % rectal cream Place 1 application rectally 2 (two) times daily. 30 g 0  . ibuprofen (ADVIL,MOTRIN) 800 MG tablet Take 1 tablet (800 mg total) by mouth every 8 (eight) hours as needed. (Patient taking differently: Take 800 mg by mouth daily as needed for moderate pain. ) 60 tablet 0  . lidocaine-prilocaine (EMLA) cream Apply to affected area once (Patient taking differently: Apply 1 application  topically once. Apply to affected area once) 30 g 3  . LORazepam (ATIVAN) 0.5 MG tablet Take 1 tablet (0.5 mg total) by mouth at bedtime as needed (Nausea or vomiting). 30 tablet 0  . metFORMIN (GLUCOPHAGE-XR) 500 MG 24 hr tablet Take 2 tablets (1,000 mg total) by mouth daily with breakfast. Annual appt is due must see provider for future refills 180 tablet 3  . Multiple Vitamin (  MULTIVITAMIN WITH MINERALS) TABS tablet Take 1 tablet by mouth daily.    . naproxen (NAPROSYN) 500 MG tablet Take 500 mg by mouth 2 (two) times daily as needed for moderate pain.    Marland Kitchen omeprazole (PRILOSEC) 40 MG capsule Take 1 capsule (40 mg total) by mouth daily. 30 capsule 0  . ondansetron (ZOFRAN) 8 MG tablet Take 1 tablet (8 mg total) by mouth every 8 (eight) hours as needed for nausea or vomiting. 20 tablet 0  . oxyCODONE (OXY IR/ROXICODONE) 5 MG immediate release tablet Take 1-2 tablets (5-10 mg total) by mouth every 6 (six) hours as needed for moderate pain, severe pain or breakthrough pain. 30 tablet 0  . prochlorperazine (COMPAZINE) 10 MG tablet Take 1 tablet (10 mg total) by mouth every 6 (six) hours as needed (Nausea or vomiting). 30 tablet 1  . valACYclovir (VALTREX) 500 MG tablet Take twice daily for 3-5 days 30 tablet 12  . zolpidem (AMBIEN) 10 MG tablet Take 1 tablet (10 mg total) by mouth at bedtime as needed for sleep. 90 tablet 1   Current Facility-Administered Medications  Medication Dose Route Frequency Provider Last Rate Last Dose  . insulin regular (NOVOLIN R,HUMULIN R) 100 units/mL injection 10 Units  10 Units Subcutaneous Once Maris Abascal, Charlestine Massed, NP       Facility-Administered Medications Ordered in Other Visits  Medication Dose Route Frequency Provider Last Rate Last Dose  . heparin lock flush 100 unit/mL  500 Units Intracatheter Once [name], [name], MD      . sodium chloride flush (NS) 0.9 % injection 10 mL  10 mL Intracatheter Once [name], [name], MD        OBJECTIVE:  See [facility]  for vitals, examined in symptom management clinic There were no vitals filed for this visit.   There is no height or weight on file to calculate BMI.   Wt Readings from Last 3 Encounters:  04/05/18 240 lb 6.4 oz (109 kg)  03/30/18 252 lb (114.3 kg)  03/29/18 252 lb (114.3 kg)  ECOG FS:2 GENERAL: Patient is a well appearing female lying down HEENT:  Sclerae anicteric.  Oropharynx clear and moist. No ulcerations or evidence of oropharyngeal candidiasis. Neck is supple.  NODES:  No cervical, supraclavicular, or axillary lymphadenopathy palpated.  BREAST EXAM: deferred LUNGS:  Clear to auscultation bilaterally.  No wheezes or rhonchi. HEART:  Regular rate and rhythm. No murmur appreciated. ABDOMEN:  Soft, nontender.  Positive, normoactive bowel sounds. No organomegaly palpated. SKIN:  Clear with no obvious rashes or skin changes. No nail dyscrasia.  NEURO:  Nonfocal. Well oriented.  Appropriate affect.     LAB RESULTS:  CMP     Component Value Date/Time   NA 133 (L) 04/06/2018 0920   K 3.1 (L) 04/06/2018 0920   CL 94 (L) 04/06/2018 0920   CO2 27 04/06/2018 0920   GLUCOSE 390 (H) 04/06/2018 0920   BUN 7 04/06/2018 0920   CREATININE 1.20 (H) 04/06/2018 0920   CALCIUM 8.9 04/06/2018 0920   PROT 7.0 04/06/2018 0920   ALBUMIN 3.4 (L) 04/06/2018 0920   AST 45 (H) 04/06/2018 0920   ALT 65 (H) 04/06/2018 0920   ALKPHOS 252 (H) 04/06/2018 0920   BILITOT 0.5 04/06/2018 0920   GFRNONAA 52 (L) 04/06/2018 0920   GFRAA >60 04/06/2018 0920    No results found for: TOTALPROTELP, ALBUMINELP, A1GS, A2GS, BETS, BETA2SER, GAMS, MSPIKE, SPEI  No results found for: KPAFRELGTCHN, LAMBDASER, KAPLAMBRATIO  Lab Results  Component  Value Date   WBC 11.4 (H) 04/06/2018   NEUTROABS 6.8 (H) 04/06/2018   HGB 11.1 (L) 04/06/2018   HCT 34.3 (L) 04/06/2018   MCV 84.5 04/06/2018   PLT 156 04/06/2018    _0 @  No results found for: LABCA2  No components found for: [id number]  No  results for input(s): INR in the last 168 hours.  No results found for: LABCA2  No results found for: HDQ222  No results found for: LNL892  No results found for: JJH417  No results found for: CA2729  No components found for: HGQUANT  No results found for: CEA1 / No results found for: CEA1   No results found for: AFPTUMOR  No results found for: CHROMOGRNA  No results found for: PSA1  Appointment on 04/06/2018  Component Date Value Ref Range Status  . WBC Count 04/06/2018 11.4* 3.9 - 10.3 K/uL Final  . RBC 04/06/2018 4.06  3.70 - 5.45 MIL/uL Final  . Hemoglobin 04/06/2018 11.1* 11.6 - 15.9 g/dL Final  . HCT 04/06/2018 34.3* 34.8 - 46.6 % Final  . MCV 04/06/2018 84.5  79.5 - 101.0 fL Final  . MCH 04/06/2018 27.3  25.1 - 34.0 pg Final  . MCHC 04/06/2018 32.4  31.5 - 36.0 g/dL Final  . RDW 04/06/2018 14.1  11.2 - 14.5 % Final  . Platelet Count 04/06/2018 156  145 - 400 K/uL Final  . Neutrophils Relative % 04/06/2018 59  % Final  . Neutro Abs 04/06/2018 6.8* 1.5 - 6.5 K/uL Final  . Lymphocytes Relative 04/06/2018 24  % Final  . Lymphs Abs 04/06/2018 2.8  0.9 - 3.3 K/uL Final  . Monocytes Relative 04/06/2018 16  % Final  . Monocytes Absolute 04/06/2018 1.8* 0.1 - 0.9 K/uL Final  . Eosinophils Relative 04/06/2018 0  % Final  . Eosinophils Absolute 04/06/2018 0.0  0.0 - 0.5 K/uL Final  . Basophils Relative 04/06/2018 1  % Final  . Basophils Absolute 04/06/2018 0.1  0.0 - 0.1 K/uL Final   Performed at [facility], [place] [address], [place], [place] [address]  . Sodium 04/06/2018 133* 135 - 145 mmol/L Final  . Potassium 04/06/2018 3.1* 3.5 - 5.1 mmol/L Final  . Chloride 04/06/2018 94* 98 - 111 mmol/L Final  . CO2 04/06/2018 27  22 - 32 mmol/L Final  . Glucose, Bld 04/06/2018 390* 70 - 99 mg/dL Final  . BUN 04/06/2018 7  6 - 20 mg/dL Final  . Creatinine 04/06/2018 1.20* 0.44 - 1.00 mg/dL Final  . Calcium 04/06/2018 8.9  8.9 - 10.3 mg/dL Final  . Total  Protein 04/06/2018 7.0  6.5 - 8.1 g/dL Final  . Albumin 04/06/2018 3.4* 3.5 - 5.0 g/dL Final  . AST 04/06/2018 45* 15 - 41 U/L Final  . ALT 04/06/2018 65* 0 - 44 U/L Final  . Alkaline Phosphatase 04/06/2018 252* 38 - 126 U/L Final  . Total Bilirubin 04/06/2018 0.5  0.3 - 1.2 mg/dL Final  . GFR, Est Non Af Am 04/06/2018 52* >60 mL/min Final  . GFR, Est AFR Am 04/06/2018 >60  >60 mL/min Final   Comment: (NOTE) The eGFR has been calculated using the CKD EPI equation. This calculation has not been validated in all clinical situations. eGFR's persistently <60 mL/min signify possible Chronic Kidney Disease.   Georgiann Hahn gap 04/06/2018 12  5 - 15 Final   Performed at [facility], [place] [address], [place], [place] [address]  Appointment on 04/05/2018  Component Date Value Ref Range Status  . Sodium 04/05/2018 132* 135 - 145 mmol/L Final  . Potassium 04/05/2018 3.2* 3.5 - 5.1 mmol/L Final  . Chloride 04/05/2018 90* 98 - 111 mmol/L Final  . CO2 04/05/2018 27  22 - 32 mmol/L Final  . Glucose, Bld 04/05/2018 419* 70 - 99 mg/dL Final  . BUN 04/05/2018 9  6 - 20 mg/dL Final  . Creatinine 04/05/2018 1.30* 0.44 - 1.00 mg/dL Final  . Calcium 04/05/2018 9.4  8.9 - 10.3 mg/dL Final  . Total Protein 04/05/2018 7.3  6.5 - 8.1 g/dL Final  . Albumin 04/05/2018 3.6  3.5 - 5.0 g/dL Final  . AST 04/05/2018 48* 15 - 41 U/L Final  . ALT 04/05/2018 81* 0 - 44 U/L Final  . Alkaline Phosphatase 04/05/2018 229* 38 - 126 U/L Final  . Total Bilirubin 04/05/2018 0.6  0.3 - 1.2 mg/dL Final  . GFR, Est Non Af Am 04/05/2018 47* >60 mL/min Final  . GFR, Est AFR Am 04/05/2018 55* >60 mL/min Final   Comment: (NOTE) The eGFR has been calculated using the CKD EPI equation. This calculation has not been validated in all clinical situations. eGFR's persistently <60 mL/min signify possible Chronic Kidney Disease.   Georgiann Hahn gap 04/05/2018 15  5 - 15 Final   Performed at [facility], [place] [address] [place], [place] [address]  . WBC Count 04/05/2018 2.6* 3.9 - 10.3 K/uL Final  . RBC 04/05/2018 4.19  3.70 - 5.45 MIL/uL Final  . Hemoglobin 04/05/2018 11.7  11.6 - 15.9 g/dL Final  . HCT 04/05/2018 35.0  34.8 - 46.6 % Final  . MCV 04/05/2018 83.5  79.5 - 101.0 fL Final  . MCH 04/05/2018 27.8  25.1 - 34.0 pg Final  . MCHC 04/05/2018 33.3  31.5 - 36.0 g/dL Final  . RDW 04/05/2018 14.0  11.2 - 14.5 % Final  . Platelet Count 04/05/2018 126* 145 - 400 K/uL Final  . Neutrophils Relative % 04/05/2018 26  % Final  . Neutro Abs 04/05/2018 0.7* 1.5 - 6.5 K/uL Final  . Lymphocytes Relative 04/05/2018 52  % Final  . Lymphs Abs 04/05/2018 1.4  0.9 - 3.3 K/uL Final  . Monocytes Relative 04/05/2018 21  % Final  . Monocytes Absolute 04/05/2018 0.6  0.1 - 0.9 K/uL Final  . Eosinophils Relative 04/05/2018 1  % Final  . Eosinophils Absolute 04/05/2018 0.0  0.0 - 0.5 K/uL Final  . Basophils Relative 04/05/2018 0  % Final  . Basophils Absolute 04/05/2018 0.0  0.0 - 0.1 K/uL Final   Performed at [facility], [place] [address] [place], [place] [address]    (this displays the last labs from the last 3 days)  No results found for: TOTALPROTELP, ALBUMINELP, A1GS, A2GS, BETS, BETA2SER, GAMS, MSPIKE, SPEI (this displays SPEP labs)  No results found for: KPAFRELGTCHN, LAMBDASER, KAPLAMBRATIO (kappa/lambda light chains)  No results found for: HGBA, HGBA2QUANT, HGBFQUANT, HGBSQUAN (Hemoglobinopathy evaluation)   No results found for: LDH  Lab Results  Component Value Date   IRON 35 (L) 01/30/2010   IRONPCTSAT 8.8 (L) 01/30/2010   (Iron and TIBC)  No results found for: FERRITIN  Urinalysis    Component Value Date/Time   COLORURINE RED (A) 04/01/2018 0202   APPEARANCEUR HAZY (A) 04/01/2018 0202   LABSPEC 1.021 04/01/2018 0202   PHURINE 6.0 04/01/2018 0202   GLUCOSEU >=500 (A) 04/01/2018 0202   GLUCOSEU NEGATIVE 09/27/2017 1002  HGBUR LARGE  (A) 04/01/2018 0202   BILIRUBINUR NEGATIVE 04/01/2018 0202   KETONESUR 5 (A) 04/01/2018 0202   PROTEINUR 100 (A) 04/01/2018 0202   UROBILINOGEN 0.2 09/27/2017 1002   NITRITE NEGATIVE 04/01/2018 0202   LEUKOCYTESUR NEGATIVE 04/01/2018 0202     STUDIES: Ct Abdomen Pelvis W Contrast  Result Date: 04/01/2018 CLINICAL DATA:  Abdominal cramping. Hematuria. Breast cancer patient with last chemotherapy on Wednesday. EXAM: CT ABDOMEN AND PELVIS WITH CONTRAST TECHNIQUE: Multidetector CT imaging of the abdomen and pelvis was performed using the standard protocol following bolus administration of intravenous contrast. CONTRAST:  127m ISOVUE-300 IOPAMIDOL (ISOVUE-300) INJECTION 61% COMPARISON:  None. FINDINGS: Lower chest: Mild dependent changes in the lung bases. Hepatobiliary: Mild diffuse fatty infiltration of the liver. No focal liver abnormality is seen. Status post cholecystectomy. No biliary dilatation. Pancreas: Unremarkable. No pancreatic ductal dilatation or surrounding inflammatory changes. Spleen: Normal in size without focal abnormality. Adrenals/Urinary Tract: No adrenal gland nodules. Subcentimeter cyst in the left kidney. Kidneys are symmetrical in size. There is mild right hydronephrosis and hydroureter. No obstructing ureteral stone is identified. This could indicate a non radiopaque stone, stricture, reflux or pyelonephritis. 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 The lower Rowe moiety in a duplicated system is susceptible to reflux. The bladder is unremarkable. Stomach/Bowel: Stomach is within normal limits. Appendix appears normal. No evidence of bowel wall thickening, distention, or inflammatory changes. Vascular/Lymphatic: No significant vascular findings are present. No enlarged abdominal or pelvic lymph nodes. Reproductive: Status post hysterectomy. No adnexal masses. Other: No abdominal wall hernia or abnormality. No abdominopelvic  ascites. Musculoskeletal: No acute or significant osseous findings. IMPRESSION: 1. 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 No obstructing ureteral stone is identified. This could indicate a non radiopaque stone, stricture, reflux or pyelonephritis. The lower Rowe moiety in a duplicated system is susceptible to reflux. 2. Mild diffuse fatty infiltration of the liver. 3. Status post cholecystectomy and hysterectomy. Electronically Signed   By: WLucienne CapersM.D.   On: 04/01/2018 06:34    ELIGIBLE FOR AVAILABLE RESEARCH PROTOCOL: no  ASSESSMENT: 47[place] post right breast upper outer quadrant biopsy 01/11/2018 for a clinical T1 N0, stage IA invasive ductal carcinoma, grade 3, estrogen and progesterone receptor positive, HER-2 amplified, with an MIB-1 of 30%.  (1) status post right lumpectomy and sentinel lymph node sampling 02/23/2018 for a pT2 pN1, stage IB invasive ductal carcinoma, grade 3, with close but negative margins  (2) adjuvant chemotherapy consisting of carboplatin, docetaxel, trastuzumab and Pertuzumab every 21 days x 6 to start 03/08/2018  (3) anti-HER-2 immunotherapy to start concurrently with chemotherapy  (a) baseline echocardiogram on 02/07/2018 showed an ejection fraction in the 55-60% range  (4) adjuvant radiation as appropriate  (5) antiestrogens to follow at the completion of local treatment  (6) genetics testing through [facility] Multi-cancer and Breast panel on 01/13/2018 showed: no deleterious mutations. The following genes were evaluated for sequence changes and exonic deletions/duplications: ALK, APC, ATM, AXIN2, BAP1, BARD1, BLM, BMPR1A, BRCA1, BRCA2, BRIP1, CASR, CDC73, CDH1, CDK4, CDKN1B, CDKN1C,CDKN2A (p14ARF), CDKN2A (p16INK4a), CEBPA, CHEK2, CTNNA1, DICER1, DIS3L2, EPCAM*, FH, FLCN, GATA2, GPC3, GREM1*, HRAS, KIT, MAX, MEN1, MET, MLH1, MSH2, MSH3, MSH6, MUTYH, NBN, NF1,  NF2, PALB2, PDGFRA, PHOX2B*, PMS2, POLD1,Rowe, POT1, PRKAR1A, PTCH1, PTEN, RAD50, RAD51C, RAD51D, RB1, RECQL4, RET, RUNX1, SDHAF2, SDHB, SDHC, SDHD,SMAD4, SMARCA4, SMARCB1, SMARCE1, STK11, SUFU, TERC, TERT, TMEM127, TP53,  TSC1, TSC2, VHL, WRN*, WT1.The following genes were evaluated for sequence changes only: EGFR*, HOXB13*, MITF*, NTHL1*, SDHA  PLAN: Kersti is feeling better which is encouraging.  She is still having increased bowel movements and she has not really been taking her anti diarrheas as she should have been.  I encouraged her to take her imodium when she needs it.  She will receive IV fluids of one liter of normal saline today.  I will send in potassium supplements for her to take.  I encouraged her to go ahead and pick up the cipro and start taking today as well.    Shenique has started checking her blood sugars at home, which is a step in the right direction.  Her blood sugar is 390 today.  She says when she took it early this morning it was 296.  She did start back on her diabetes medications.  I continued to emphasize the need to get these sugars better due to complications that can arise from persistent hyperglycemia.  We will give her 10 units of regular insulin today.    She will return tomorrow for another round of IV fluids.  We will get a BMET tomorrow to f/u with her sugars and potassium.    Alyn knows to call for any questions or concerns prior to her next appointment with [place].  We can certainly see her sooner if needed.  A total of (30) minutes of face-to-face time was spent with this patient with greater than 50% of that time in counseling and care-coordination.   Wilber Bihari, NP  04/06/18 11:09 AM Medical Oncology and Hematology [facility] [address] [place], [place] [address] Tel. [phone]    Fax. [phone]

## 2018-04-06 NOTE — Progress Notes (Signed)
Pt to receive IVF today.  Unable to get blood return from port, assessed by RN Lanelle Bal and RN Learta Codding.  Pt given cathflo.  Blood return obtained after 30 min with cathflo.  IVF started.  Pt to receive insulin per NP Mendel Ryder C.  Pt eating at this time.

## 2018-04-06 NOTE — Telephone Encounter (Signed)
Gave avs and calendar ° °

## 2018-04-06 NOTE — Patient Instructions (Signed)
Dehydration, Adult Dehydration is when there is not enough fluid or water in your body. This happens when you lose more fluids than you take in. Dehydration can range from mild to very bad. It should be treated right away to keep it from getting very bad. Symptoms of mild dehydration may include:  Thirst.  Dry lips.  Slightly dry mouth.  Dry, warm skin.  Dizziness. Symptoms of moderate dehydration may include:  Very dry mouth.  Muscle cramps.  Dark pee (urine). Pee may be the color of tea.  Your body making less pee.  Your eyes making fewer tears.  Heartbeat that is uneven or faster than normal (palpitations).  Headache.  Light-headedness, especially when you stand up from sitting.  Fainting (syncope). Symptoms of very bad dehydration may include:  Changes in skin, such as: ? Cold and clammy skin. ? Blotchy (mottled) or pale skin. ? Skin that does not quickly return to normal after being lightly pinched and let go (poor skin turgor).  Changes in body fluids, such as: ? Feeling very thirsty. ? Your eyes making fewer tears. ? Not sweating when body temperature is high, such as in hot weather. ? Your body making very little pee.  Changes in vital signs, such as: ? Weak pulse. ? Pulse that is more than 100 beats a minute when you are sitting still. ? Fast breathing. ? Low blood pressure.  Other changes, such as: ? Sunken eyes. ? Cold hands and feet. ? Confusion. ? Lack of energy (lethargy). ? Trouble waking up from sleep. ? Short-term weight loss. ? Unconsciousness. Follow these instructions at home:  If told by your doctor, drink an ORS: ? Make an ORS by using instructions on the package. ? Start by drinking small amounts, about  cup (120 mL) every 5-10 minutes. ? Slowly drink more until you have had the amount that your doctor said to have.  Drink enough clear fluid to keep your pee clear or pale yellow. If you were told to drink an ORS, finish the ORS  first, then start slowly drinking clear fluids. Drink fluids such as: ? Water. Do not drink only water by itself. Doing that can make the salt (sodium) level in your body get too low (hyponatremia). ? Ice chips. ? Fruit juice that you have added water to (diluted). ? Low-calorie sports drinks.  Avoid: ? Alcohol. ? Drinks that have a lot of sugar. These include high-calorie sports drinks, fruit juice that does not have water added, and soda. ? Caffeine. ? Foods that are greasy or have a lot of fat or sugar.  Take over-the-counter and prescription medicines only as told by your doctor.  Do not take salt tablets. Doing that can make the salt level in your body get too high (hypernatremia).  Eat foods that have minerals (electrolytes). Examples include bananas, oranges, potatoes, tomatoes, and spinach.  Keep all follow-up visits as told by your doctor. This is important. Contact a doctor if:  You have belly (abdominal) pain that: ? Gets worse. ? Stays in one area (localizes).  You have a rash.  You have a stiff neck.  You get angry or annoyed more easily than normal (irritability).  You are more sleepy than normal.  You have a harder time waking up than normal.  You feel: ? Weak. ? Dizzy. ? Very thirsty.  You have peed (urinated) only a small amount of very dark pee during 6-8 hours. Get help right away if:  You have symptoms of   very bad dehydration.  You cannot drink fluids without throwing up (vomiting).  Your symptoms get worse with treatment.  You have a fever.  You have a very bad headache.  You are throwing up or having watery poop (diarrhea) and it: ? Gets worse. ? Does not go away.  You have blood or something green (bile) in your throw-up.  You have blood in your poop (stool). This may cause poop to look black and tarry.  You have not peed in 6-8 hours.  You pass out (faint).  Your heart rate when you are sitting still is more than 100 beats a  minute.  You have trouble breathing. This information is not intended to replace advice given to you by your health care provider. Make sure you discuss any questions you have with your health care provider. Document Released: 05/08/2009 Document Revised: 01/30/2016 Document Reviewed: 09/05/2015 Elsevier Interactive Patient Education  2018 Elsevier Inc.  

## 2018-04-07 ENCOUNTER — Other Ambulatory Visit: Payer: Self-pay

## 2018-04-07 ENCOUNTER — Ambulatory Visit (HOSPITAL_COMMUNITY)
Admission: RE | Admit: 2018-04-07 | Discharge: 2018-04-07 | Disposition: A | Discharge status: Home / Self Care | Payer: BC Managed Care – PPO | Source: Ambulatory Visit | Attending: Adult Health | Admitting: Adult Health

## 2018-04-07 ENCOUNTER — Inpatient Hospital Stay: Payer: BC Managed Care – PPO

## 2018-04-07 ENCOUNTER — Telehealth: Payer: Self-pay | Admitting: Medical Oncology

## 2018-04-07 ENCOUNTER — Other Ambulatory Visit: Payer: Self-pay | Admitting: Adult Health

## 2018-04-07 VITALS — BP 114/76 | HR 98 | Temp 97.5°F | Resp 18

## 2018-04-07 DIAGNOSIS — C773 Secondary and unspecified malignant neoplasm of axilla and upper limb lymph nodes: Secondary | ICD-10-CM

## 2018-04-07 DIAGNOSIS — Z170 Estrogen receptor positive status [ER+]: Secondary | ICD-10-CM

## 2018-04-07 DIAGNOSIS — C50911 Malignant neoplasm of unspecified site of right female breast: Secondary | ICD-10-CM

## 2018-04-07 DIAGNOSIS — C50411 Malignant neoplasm of upper-outer quadrant of right female breast: Secondary | ICD-10-CM

## 2018-04-07 DIAGNOSIS — Z95828 Presence of other vascular implants and grafts: Secondary | ICD-10-CM

## 2018-04-07 DIAGNOSIS — Z5112 Encounter for antineoplastic immunotherapy: Secondary | ICD-10-CM | POA: Diagnosis not present

## 2018-04-07 DIAGNOSIS — Z452 Encounter for adjustment and management of vascular access device: Secondary | ICD-10-CM | POA: Diagnosis not present

## 2018-04-07 DIAGNOSIS — R739 Hyperglycemia, unspecified: Secondary | ICD-10-CM

## 2018-04-07 LAB — CMP (CANCER CENTER ONLY)
ALT: 52 U/L — ABNORMAL HIGH (ref 0–44)
AST: 41 U/L (ref 15–41)
Albumin: 3.3 g/dL — ABNORMAL LOW (ref 3.5–5.0)
Alkaline Phosphatase: 270 U/L — ABNORMAL HIGH (ref 38–126)
Anion gap: 11 (ref 5–15)
BUN: 5 mg/dL — ABNORMAL LOW (ref 6–20)
CO2: 28 mmol/L (ref 22–32)
Calcium: 8.7 mg/dL — ABNORMAL LOW (ref 8.9–10.3)
Chloride: 95 mmol/L — ABNORMAL LOW (ref 98–111)
Creatinine: 1.16 mg/dL — ABNORMAL HIGH (ref 0.44–1.00)
GFR, Est AFR Am: >60 mL/min (ref >60)
GFR, Estimated: 54 mL/min — ABNORMAL LOW (ref >60)
Glucose, Bld: 438 mg/dL — ABNORMAL HIGH (ref 70–99)
Potassium: 3.3 mmol/L — ABNORMAL LOW (ref 3.5–5.1)
Sodium: 134 mmol/L — ABNORMAL LOW (ref 135–145)
Total Bilirubin: 0.4 mg/dL (ref 0.3–1.2)
Total Protein: 6.7 g/dL (ref 6.5–8.1)

## 2018-04-07 LAB — CBC WITH DIFFERENTIAL (CANCER CENTER ONLY)
Basophils Absolute: 0.1 10*3/uL (ref 0.0–0.1)
Basophils Relative: 1 %
Eosinophils Absolute: 0 10*3/uL (ref 0.0–0.5)
Eosinophils Relative: 0 %
HCT: 32.8 % — ABNORMAL LOW (ref 34.8–46.6)
Hemoglobin: 10.7 g/dL — ABNORMAL LOW (ref 11.6–15.9)
Lymphocytes Relative: 16 %
Lymphs Abs: 2.8 10*3/uL (ref 0.9–3.3)
MCH: 27.6 pg (ref 25.1–34.0)
MCHC: 32.6 g/dL (ref 31.5–36.0)
MCV: 84.8 fL (ref 79.5–101.0)
Monocytes Absolute: 2 10*3/uL — ABNORMAL HIGH (ref 0.1–0.9)
Monocytes Relative: 11 %
Neutro Abs: 13 10*3/uL — ABNORMAL HIGH (ref 1.5–6.5)
Neutrophils Relative %: 72 %
Platelet Count: 157 10*3/uL (ref 145–400)
RBC: 3.87 MIL/uL (ref 3.70–5.45)
RDW: 14.5 % (ref 11.2–14.5)
WBC Count: 17.9 10*3/uL — ABNORMAL HIGH (ref 3.9–10.3)
nRBC: 1 /100 WBC — ABNORMAL HIGH

## 2018-04-07 MED ORDER — INSULIN REGULAR HUMAN 100 UNIT/ML IJ SOLN
10.0000 [IU] | Freq: Once | INTRAMUSCULAR | Status: AC
  Administered 2018-04-07: 10 [IU] via SUBCUTANEOUS
  Filled 2018-04-07: qty 0.1

## 2018-04-07 MED ORDER — SODIUM CHLORIDE 0.9% FLUSH
10.0000 mL | Freq: Once | INTRAVENOUS | Status: AC
  Administered 2018-04-07: 10 mL
  Filled 2018-04-07: qty 10

## 2018-04-07 MED ORDER — HEPARIN SOD (PORK) LOCK FLUSH 100 UNIT/ML IV SOLN
500.0000 [IU] | Freq: Once | INTRAVENOUS | Status: AC
  Administered 2018-04-07: 500 [IU]
  Filled 2018-04-07: qty 5

## 2018-04-07 MED ORDER — SODIUM CHLORIDE 0.9 % IV SOLN
INTRAVENOUS | Status: DC
  Administered 2018-04-07: 12:00:00 via INTRAVENOUS
  Filled 2018-04-07 (×2): qty 250

## 2018-04-07 MED ORDER — INSULIN REGULAR HUMAN 100 UNIT/ML IJ SOLN
10.0000 [IU] | Freq: Once | INTRAMUSCULAR | Status: DC
  Filled 2018-04-07: qty 0.1

## 2018-04-07 NOTE — Telephone Encounter (Signed)
Pt notified to go to xray before appts at cancer center.

## 2018-04-07 NOTE — Progress Notes (Signed)
Pt's CXR from today shows PAC terminating "at the expected location of the junction of brachiocephalic vein and superior vena cava. Mild kinking of the catheter is seen at the level of the second rib laterally." Per Mendel Ryder Causey-NP IVF to be given though PIV, and she will contact the surgeon Dr. Barry Dienes to see what she would like to do about possibly revising tip placement.   Pt updated on plan and agreeable to proceed with PIV. Will continue to monitor.  12:26 Per Mendel Ryder Causey-NP per Dr. Barry Dienes: OK to give IVF through [facility], and Dr. Marlowe Aschoff RN will contact pt to schedule appt for [facility] revision. Pt verbalized understanding and agreement.  PAC accessed without incident and blood return noted. Flushed and IVF started. Will continue to monitor

## 2018-04-07 NOTE — Patient Instructions (Signed)
Dehydration, Adult Dehydration is when there is not enough fluid or water in your body. This happens when you lose more fluids than you take in. Dehydration can range from mild to very bad. It should be treated right away to keep it from getting very bad. Symptoms of mild dehydration may include:  Thirst.  Dry lips.  Slightly dry mouth.  Dry, warm skin.  Dizziness. Symptoms of moderate dehydration may include:  Very dry mouth.  Muscle cramps.  Dark pee (urine). Pee may be the color of tea.  Your body making less pee.  Your eyes making fewer tears.  Heartbeat that is uneven or faster than normal (palpitations).  Headache.  Light-headedness, especially when you stand up from sitting.  Fainting (syncope). Symptoms of very bad dehydration may include:  Changes in skin, such as: ? Cold and clammy skin. ? Blotchy (mottled) or pale skin. ? Skin that does not quickly return to normal after being lightly pinched and let go (poor skin turgor).  Changes in body fluids, such as: ? Feeling very thirsty. ? Your eyes making fewer tears. ? Not sweating when body temperature is high, such as in hot weather. ? Your body making very little pee.  Changes in vital signs, such as: ? Weak pulse. ? Pulse that is more than 100 beats a minute when you are sitting still. ? Fast breathing. ? Low blood pressure.  Other changes, such as: ? Sunken eyes. ? Cold hands and feet. ? Confusion. ? Lack of energy (lethargy). ? Trouble waking up from sleep. ? Short-term weight loss. ? Unconsciousness. Follow these instructions at home:  If told by your doctor, drink an ORS: ? Make an ORS by using instructions on the package. ? Start by drinking small amounts, about  cup (120 mL) every 5-10 minutes. ? Slowly drink more until you have had the amount that your doctor said to have.  Drink enough clear fluid to keep your pee clear or pale yellow. If you were told to drink an ORS, finish the ORS  first, then start slowly drinking clear fluids. Drink fluids such as: ? Water. Do not drink only water by itself. Doing that can make the salt (sodium) level in your body get too low (hyponatremia). ? Ice chips. ? Fruit juice that you have added water to (diluted). ? Low-calorie sports drinks.  Avoid: ? Alcohol. ? Drinks that have a lot of sugar. These include high-calorie sports drinks, fruit juice that does not have water added, and soda. ? Caffeine. ? Foods that are greasy or have a lot of fat or sugar.  Take over-the-counter and prescription medicines only as told by your doctor.  Do not take salt tablets. Doing that can make the salt level in your body get too high (hypernatremia).  Eat foods that have minerals (electrolytes). Examples include bananas, oranges, potatoes, tomatoes, and spinach.  Keep all follow-up visits as told by your doctor. This is important. Contact a doctor if:  You have belly (abdominal) pain that: ? Gets worse. ? Stays in one area (localizes).  You have a rash.  You have a stiff neck.  You get angry or annoyed more easily than normal (irritability).  You are more sleepy than normal.  You have a harder time waking up than normal.  You feel: ? Weak. ? Dizzy. ? Very thirsty.  You have peed (urinated) only a small amount of very dark pee during 6-8 hours. Get help right away if:  You have symptoms of   very bad dehydration.  You cannot drink fluids without throwing up (vomiting).  Your symptoms get worse with treatment.  You have a fever.  You have a very bad headache.  You are throwing up or having watery poop (diarrhea) and it: ? Gets worse. ? Does not go away.  You have blood or something green (bile) in your throw-up.  You have blood in your poop (stool). This may cause poop to look black and tarry.  You have not peed in 6-8 hours.  You pass out (faint).  Your heart rate when you are sitting still is more than 100 beats a  minute.  You have trouble breathing. This information is not intended to replace advice given to you by your health care provider. Make sure you discuss any questions you have with your health care provider. Document Released: 05/08/2009 Document Revised: 01/30/2016 Document Reviewed: 09/05/2015 Elsevier Interactive Patient Education  2018 Elsevier Inc.  

## 2018-04-10 ENCOUNTER — Ambulatory Visit: Payer: BC Managed Care – PPO | Admitting: Internal Medicine

## 2018-04-10 DIAGNOSIS — F411 Generalized anxiety disorder: Secondary | ICD-10-CM

## 2018-04-10 DIAGNOSIS — E119 Type 2 diabetes mellitus without complications: Secondary | ICD-10-CM

## 2018-04-10 DIAGNOSIS — I10 Essential (primary) hypertension: Secondary | ICD-10-CM | POA: Diagnosis not present

## 2018-04-10 MED ORDER — DULAGLUTIDE 1.5 MG/0.5ML ~~LOC~~ SOAJ: SUBCUTANEOUS | 3 refills | Status: DC

## 2018-04-10 NOTE — Assessment & Plan Note (Signed)
stable overall by history and exam, recent data reviewed with pt, and pt to continue medical treatment as before,  to f/u any worsening symptoms or concerns  

## 2018-04-10 NOTE — Patient Instructions (Signed)
Ok to increase the trulicity to 1.5 IN/8.6 ml per week  Please continue all other medications as before, and refills have been done if requested.  Please have the pharmacy call with any other refills you may need.  Please continue your efforts at being more active, low cholesterol diet, and weight control.  Please keep your appointments with your specialists as you may have planned

## 2018-04-10 NOTE — Progress Notes (Signed)
Subjective:    Patient ID: Kelly Rowe, female    DOB: Feb 25, 1971, 47 y.o.   MRN: [id number]  HPI  Here to f/u after recent labs with glc 478; overall doing ok,  Pt denies chest pain, increasing sob or doe, wheezing, orthopnea, PND, increased LE swelling, palpitations, dizziness or syncope.  Pt denies new neurological symptoms such as new headache, or facial or extremity weakness or numbness.  Pt denies polydipsia, polyuria, or low sugar episode.  Pt states overall good compliance with meds, mostly trying to follow appropriate diet, with wt overall stable,  but little exercise however. 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 ANEMIA-IRON DEFICIENCY 01/30/2010  . ANXIETY 11/27/2007  . Arthritis    both knees  . Asthma 02/25/2011  . Cancer ([place])    breast  . Carbuncle and furuncle of trunk 04/16/2010  . Chlamydia infection 03/21/2008  . DIABETES MELLITUS, TYPE II 08/02/2007  . Edema 01/30/2010  . ELEVATED BLOOD PRESSURE WITHOUT DIAGNOSIS OF HYPERTENSION 08/02/2007  . Family history of breast cancer   . Family history of colon cancer   . Family history of lung cancer   . FREQUENCY, URINARY 05/19/2009  . GENITAL HERPES 03/12/2009  . HIV (human immunodeficiency virus infection) ([place]) 2009  . HIV INFECTION 03/12/2009  . HSV (herpes simplex virus) infection   . HYPERLIPIDEMIA 08/02/2007  . Hypertension   . Metrorrhagia 03/21/2008  . PONV (postoperative nausea and vomiting)   . RETENTION, URINE 05/19/2009  . Trichomonas infection 01/19/2010  . VITAMIN D DEFICIENCY 01/30/2010   Past Surgical History:  Procedure Laterality Date  . ABDOMINAL HYSTERECTOMY  07/22/2010   TAH WITH PRESERVATION OF BOTH TUBES AND OVARIES  . BREAST LUMPECTOMY WITH RADIOACTIVE SEED AND SENTINEL LYMPH NODE BIOPSY Right 02/23/2018   Procedure: BREAST LUMPECTOMY WITH  RADIOACTIVE SEED AND SENTINEL LYMPH NODE BIOPSY;  Surgeon: Stark Klein, MD;  Location: [facility];  Service: General;  Laterality: Right;  . CHOLECYSTECTOMY    . ENDOMETRIAL ABLATION  01/11/2008   HER OPTION  . ESOPHAGOGASTRODUODENOSCOPY    . MULTIPLE TOOTH EXTRACTIONS    . PORTACATH PLACEMENT Left 02/23/2018   Procedure: INSERTION PORT-A-CATH;  Surgeon: Stark Klein, MD;  Location: [facility];  Service: General;  Laterality: Left;  . TUBAL LIGATION    . WISDOM TOOTH EXTRACTION      reports that she has never smoked. She has never used smokeless tobacco. She reports that she drinks alcohol. She reports that she does not use drugs. family history includes Breast cancer in her paternal aunt, paternal aunt, and paternal aunt; Cancer in her father; Colon cancer in her other and paternal grandfather; Diabetes in her father and mother; Heart attack (age of onset: 28) in her maternal grandfather; Heart disease in her other; Hypertension in her mother; Lung cancer in her paternal aunt; Lung cancer (age of onset: 52) in her maternal grandmother; Prostate cancer in her other; Stroke in her other and paternal uncle. Allergies  Allergen Reactions  . Levaquin [Levofloxacin In D5w] Nausea And Vomiting and Rash  . Penicillins Swelling and Rash    Has patient had a PCN reaction causing immediate rash, facial/tongue/throat swelling, SOB or lightheadedness with hypotension: Yes Has patient had a PCN reaction causing severe rash involving mucus membranes or skin necrosis: No Has patient had a PCN reaction that required hospitalization: Yes Has patient  had a PCN reaction occurring within the last 10 years: No If all of the above answers are "NO", then may proceed with Cephalosporin use.   Current Outpatient Medications on File Prior to Visit  Medication Sig Dispense Refill  . albuterol (PROVENTIL HFA;VENTOLIN HFA) 108 (90 Base) MCG/ACT inhaler Inhale 2 puffs into the lungs every 6 (six) hours as needed for wheezing or  shortness of breath.    Marland Kitchen aspirin 81 MG tablet Take 81 mg by mouth daily as needed (chest pain).     . Azelastine-Fluticasone (DYMISTA) 137-50 MCG/ACT SUSP Use as directed 1 spray each side twice per day as needed 23 g 5  . Beclomethasone Dipropionate (QNASL) 80 MCG/ACT AERS Place 2 sprays into the nose daily. 1 Inhaler 1  . blood glucose meter kit and supplies Dispense based on patient and insurance preference. Use up to four times daily as directed. (FOR ICD-10 E10.9, E11.9). 1 each 0  . dexamethasone (DECADRON) 4 MG tablet Take 2 tablets (8 mg total) by mouth 2 (two) times daily. Start the day before Taxotere. Take once the day after, then 2 times a day x 2d. 30 tablet 1  . Diclofenac Sodium (PENNSAID) 2 % SOLN Place 1 application onto the skin 2 (two) times daily. (Patient taking differently: Place 1 application onto the skin 2 (two) times daily as needed. ) 1 Bottle 3  . doxycycline (VIBRA-TABS) 100 MG tablet Take 1 tablet (100 mg total) by mouth 2 (two) times daily. 14 tablet 0  . elvitegravir-cobicistat-emtricitabine-tenofovir (STRIBILD) 150-150-200-300 MG TABS tablet Take 1 tablet daily by mouth.    . famotidine (PEPCID) 20 MG tablet Take 1 tablet (20 mg total) by mouth 2 (two) times daily. 60 tablet 3  . ferrous gluconate (IRON 27) 240 (27 FE) MG tablet Take 480 mg by mouth daily.    Marland Kitchen glipiZIDE (GLIPIZIDE XL) 5 MG 24 hr tablet Take 1 tablet (5 mg total) by mouth daily with breakfast. 90 tablet 3  . hydrocortisone (ANUSOL-HC) 2.5 % rectal cream Place 1 application rectally 2 (two) times daily. 30 g 0  . ibuprofen (ADVIL,MOTRIN) 800 MG tablet Take 1 tablet (800 mg total) by mouth every 8 (eight) hours as needed. (Patient taking differently: Take 800 mg by mouth daily as needed for moderate pain. ) 60 tablet 0  . lidocaine-prilocaine (EMLA) cream Apply to affected area once (Patient taking differently: Apply 1 application topically once. Apply to affected area once) 30 g 3  . LORazepam (ATIVAN)  0.5 MG tablet Take 1 tablet (0.5 mg total) by mouth at bedtime as needed (Nausea or vomiting). 30 tablet 0  . metFORMIN (GLUCOPHAGE-XR) 500 MG 24 hr tablet Take 2 tablets (1,000 mg total) by mouth daily with breakfast. Annual appt is due must see provider for future refills 180 tablet 3  . Multiple Vitamin (MULTIVITAMIN WITH MINERALS) TABS tablet Take 1 tablet by mouth daily.    . naproxen (NAPROSYN) 500 MG tablet Take 500 mg by mouth 2 (two) times daily as needed for moderate pain.    Marland Kitchen omeprazole (PRILOSEC) 40 MG capsule Take 1 capsule (40 mg total) by mouth daily. 30 capsule 0  . ondansetron (ZOFRAN) 8 MG tablet Take 1 tablet (8 mg total) by mouth every 8 (eight) hours as needed for nausea or vomiting. 20 tablet 0  . oxyCODONE (OXY IR/ROXICODONE) 5 MG immediate release tablet Take 1-2 tablets (5-10 mg total) by mouth every 6 (six) hours as needed for moderate pain, severe pain or  breakthrough pain. 30 tablet 0  . potassium chloride (K-DUR) 10 MEQ tablet Take 2 tablets (20 mEq total) by mouth 2 (two) times daily. 20 tablet 0  . prochlorperazine (COMPAZINE) 10 MG tablet Take 1 tablet (10 mg total) by mouth every 6 (six) hours as needed (Nausea or vomiting). 30 tablet 1  . valACYclovir (VALTREX) 500 MG tablet Take twice daily for 3-5 days 30 tablet 12  . zolpidem (AMBIEN) 10 MG tablet Take 1 tablet (10 mg total) by mouth at bedtime as needed for sleep. 90 tablet 1   No current facility-administered medications on file prior to visit.    Review of Systems  Constitutional: Negative for other unusual diaphoresis or sweats HENT: Negative for ear discharge or swelling Eyes: Negative for other worsening visual disturbances Respiratory: Negative for stridor or other swelling  Gastrointestinal: Negative for worsening distension or other blood Genitourinary: Negative for retention or other urinary change Musculoskeletal: Negative for other MSK pain or swelling Skin: Negative for color change or other  new lesions Neurological: Negative for worsening tremors and other numbness  Psychiatric/Behavioral: Negative for worsening agitation or other fatigue All other system neg per pt    Objective:   Physical Exam Blood pressure 128/86, pulse (!) 103, temperature 98.6 F (37 C), temperature source Oral, height 5' 2"  (1.575 m), weight 246 lb (111.6 kg), last menstrual period 06/21/2010, SpO2 98 %. VS noted,  Constitutional: Pt appears in NAD HENT: Head: NCAT.  Right Ear: External ear normal.  Left Ear: External ear normal.  Eyes: . Pupils are equal, round, and reactive to light. Conjunctivae and EOM are normal Nose: without d/c or deformity Neck: Neck supple. Gross normal ROM Cardiovascular: Normal rate and regular rhythm.   Pulmonary/Chest: Effort normal and breath sounds without rales or wheezing.  Abd:  Soft, NT, ND, + BS, no organomegaly Neurological: Pt is alert. At baseline orientation, motor grossly intact Skin: Skin is warm. No rashes, other new lesions, no LE edema Psychiatric: Pt behavior is normal without agitation , mild nervous No other exam findings  Lab Results  Component Value Date   WBC 17.9 (H) 04/07/2018   HGB 10.7 (L) 04/07/2018   HCT 32.8 (L) 04/07/2018   PLT 157 04/07/2018   GLUCOSE 438 (H) 04/07/2018   CHOL 202 (H) 09/08/2017   TRIG 92.0 09/08/2017   HDL 48.80 09/08/2017   LDLDIRECT 147.7 07/05/2012   LDLCALC 135 (H) 09/08/2017   ALT 52 (H) 04/07/2018   AST 41 04/07/2018   NA 134 (L) 04/07/2018   K 3.3 (L) 04/07/2018   CL 95 (L) 04/07/2018   CREATININE 1.16 (H) 04/07/2018   BUN 5 (L) 04/07/2018   CO2 28 04/07/2018   TSH 1.41 09/27/2017   HGBA1C 7.4 (H) 02/15/2018   MICROALBUR 1.2 09/27/2017       Assessment & Plan:

## 2018-04-10 NOTE — Assessment & Plan Note (Signed)
Uncontrolled, for, increased trulicity to 1.5, cont all other tx, cont to monitor cbgs, call for persistent > 200

## 2018-04-11 ENCOUNTER — Other Ambulatory Visit: Payer: Self-pay | Admitting: General Surgery

## 2018-04-14 ENCOUNTER — Telehealth: Payer: Self-pay | Admitting: Oncology

## 2018-04-14 NOTE — Telephone Encounter (Signed)
Faxed medical records to [facility], Release ID: [id number]

## 2018-04-18 ENCOUNTER — Other Ambulatory Visit: Payer: Self-pay

## 2018-04-18 DIAGNOSIS — C50411 Malignant neoplasm of upper-outer quadrant of right female breast: Secondary | ICD-10-CM

## 2018-04-18 DIAGNOSIS — Z170 Estrogen receptor positive status [ER+]: Secondary | ICD-10-CM

## 2018-04-19 ENCOUNTER — Encounter: Payer: Self-pay | Admitting: Adult Health

## 2018-04-19 ENCOUNTER — Telehealth: Payer: Self-pay | Admitting: Adult Health

## 2018-04-19 ENCOUNTER — Inpatient Hospital Stay: Payer: BC Managed Care – PPO

## 2018-04-19 ENCOUNTER — Inpatient Hospital Stay: Payer: BC Managed Care – PPO | Admitting: Nutrition

## 2018-04-19 ENCOUNTER — Inpatient Hospital Stay (HOSPITAL_BASED_OUTPATIENT_CLINIC_OR_DEPARTMENT_OTHER): Payer: BC Managed Care – PPO | Admitting: Adult Health

## 2018-04-19 ENCOUNTER — Encounter: Payer: Self-pay | Admitting: *Deleted

## 2018-04-19 VITALS — BP 134/90 | HR 89 | Temp 98.3°F | Resp 16 | Ht 62.0 in | Wt 248.0 lb

## 2018-04-19 DIAGNOSIS — R110 Nausea: Secondary | ICD-10-CM | POA: Diagnosis not present

## 2018-04-19 DIAGNOSIS — Z170 Estrogen receptor positive status [ER+]: Secondary | ICD-10-CM

## 2018-04-19 DIAGNOSIS — C50411 Malignant neoplasm of upper-outer quadrant of right female breast: Secondary | ICD-10-CM

## 2018-04-19 DIAGNOSIS — E119 Type 2 diabetes mellitus without complications: Secondary | ICD-10-CM

## 2018-04-19 DIAGNOSIS — B20 Human immunodeficiency virus [HIV] disease: Secondary | ICD-10-CM | POA: Diagnosis not present

## 2018-04-19 DIAGNOSIS — Z79899 Other long term (current) drug therapy: Secondary | ICD-10-CM

## 2018-04-19 DIAGNOSIS — Z5112 Encounter for antineoplastic immunotherapy: Secondary | ICD-10-CM | POA: Diagnosis not present

## 2018-04-19 LAB — HEPATIC FUNCTION PANEL
ALT: 28 U/L (ref 0–44)
AST: 37 U/L (ref 15–41)
Albumin: 3.3 g/dL — ABNORMAL LOW (ref 3.5–5.0)
Alkaline Phosphatase: 142 U/L — ABNORMAL HIGH (ref 38–126)
Bilirubin, Direct: 0.1 mg/dL (ref 0.0–0.2)
Indirect Bilirubin: 0.4 mg/dL (ref 0.3–0.9)
Total Bilirubin: 0.5 mg/dL (ref 0.3–1.2)
Total Protein: 6.8 g/dL (ref 6.5–8.1)

## 2018-04-19 LAB — CBC WITH DIFFERENTIAL (CANCER CENTER ONLY)
Basophils Absolute: 0 10*3/uL (ref 0.0–0.1)
Basophils Relative: 1 %
Eosinophils Absolute: 0 10*3/uL (ref 0.0–0.5)
Eosinophils Relative: 0 %
HCT: 33.6 % — ABNORMAL LOW (ref 34.8–46.6)
Hemoglobin: 11 g/dL — ABNORMAL LOW (ref 11.6–15.9)
Lymphocytes Relative: 42 %
Lymphs Abs: 1.7 10*3/uL (ref 0.9–3.3)
MCH: 27.9 pg (ref 25.1–34.0)
MCHC: 32.6 g/dL (ref 31.5–36.0)
MCV: 85.8 fL (ref 79.5–101.0)
Monocytes Absolute: 0.5 10*3/uL (ref 0.1–0.9)
Monocytes Relative: 13 %
Neutro Abs: 1.8 10*3/uL (ref 1.5–6.5)
Neutrophils Relative %: 44 %
Platelet Count: 313 10*3/uL (ref 145–400)
RBC: 3.92 MIL/uL (ref 3.70–5.45)
RDW: 15.8 % — ABNORMAL HIGH (ref 11.2–14.5)
WBC Count: 4.2 10*3/uL (ref 3.9–10.3)

## 2018-04-19 LAB — BASIC METABOLIC PANEL - CANCER CENTER ONLY
Anion gap: 11 (ref 5–15)
BUN: 6 mg/dL (ref 6–20)
CO2: 29 mmol/L (ref 22–32)
Calcium: 9 mg/dL (ref 8.9–10.3)
Chloride: 100 mmol/L (ref 98–111)
Creatinine: 0.84 mg/dL (ref 0.44–1.00)
GFR, Est AFR Am: >60 mL/min (ref >60)
GFR, Estimated: >60 mL/min (ref >60)
Glucose, Bld: 246 mg/dL — ABNORMAL HIGH (ref 70–99)
Potassium: 3.4 mmol/L — ABNORMAL LOW (ref 3.5–5.1)
Sodium: 140 mmol/L (ref 135–145)

## 2018-04-19 MED ORDER — PALONOSETRON HCL INJECTION 0.25 MG/5ML
0.2500 mg | Freq: Once | INTRAVENOUS | Status: AC
  Administered 2018-04-19: 0.25 mg via INTRAVENOUS

## 2018-04-19 MED ORDER — TRASTUZUMAB CHEMO 150 MG IV SOLR
6.0000 mg/kg | Freq: Once | INTRAVENOUS | Status: AC
  Administered 2018-04-19: 714 mg via INTRAVENOUS
  Filled 2018-04-19: qty 34

## 2018-04-19 MED ORDER — SODIUM CHLORIDE 0.9 % IV SOLN
800.0000 mg/m2 | Freq: Once | INTRAVENOUS | Status: AC
  Administered 2018-04-19: 1824 mg via INTRAVENOUS
  Filled 2018-04-19: qty 48

## 2018-04-19 MED ORDER — DIPHENHYDRAMINE HCL 25 MG PO CAPS
ORAL_CAPSULE | ORAL | Status: AC
  Filled 2018-04-19: qty 1

## 2018-04-19 MED ORDER — SODIUM CHLORIDE 0.9 % IV SOLN
750.0000 mg | Freq: Once | INTRAVENOUS | Status: AC
  Administered 2018-04-19: 750 mg via INTRAVENOUS
  Filled 2018-04-19: qty 75

## 2018-04-19 MED ORDER — PALONOSETRON HCL INJECTION 0.25 MG/5ML
INTRAVENOUS | Status: AC
  Filled 2018-04-19: qty 5

## 2018-04-19 MED ORDER — ACETAMINOPHEN 325 MG PO TABS
650.0000 mg | ORAL_TABLET | Freq: Once | ORAL | Status: AC
  Administered 2018-04-19: 650 mg via ORAL

## 2018-04-19 MED ORDER — DIPHENHYDRAMINE HCL 25 MG PO CAPS
25.0000 mg | ORAL_CAPSULE | Freq: Once | ORAL | Status: AC
  Administered 2018-04-19: 25 mg via ORAL

## 2018-04-19 MED ORDER — SODIUM CHLORIDE 0.9 % IV SOLN
Freq: Once | INTRAVENOUS | Status: AC
  Administered 2018-04-19: 12:00:00 via INTRAVENOUS
  Filled 2018-04-19: qty 5

## 2018-04-19 MED ORDER — ACETAMINOPHEN 325 MG PO TABS
ORAL_TABLET | ORAL | Status: AC
  Filled 2018-04-19: qty 2

## 2018-04-19 MED ORDER — SODIUM CHLORIDE 0.9 % IV SOLN
Freq: Once | INTRAVENOUS | Status: AC
  Administered 2018-04-19: 11:00:00 via INTRAVENOUS
  Filled 2018-04-19: qty 250

## 2018-04-19 NOTE — Telephone Encounter (Signed)
Gave patient avs and calendar.   °

## 2018-04-19 NOTE — Patient Instructions (Signed)
[place] Discharge Instructions for Patients Receiving Chemotherapy  Today you received the following chemotherapy agents: Herceptin, Gemzar, Carboplatin.  To help prevent nausea and vomiting after your treatment, we encourage you to take your nausea medication as prescribed.   If you develop nausea and vomiting that is not controlled by your nausea medication, call the clinic.   BELOW ARE SYMPTOMS THAT SHOULD BE REPORTED IMMEDIATELY:  *FEVER GREATER THAN 100.5 F  *CHILLS WITH OR WITHOUT FEVER  NAUSEA AND VOMITING THAT IS NOT CONTROLLED WITH YOUR NAUSEA MEDICATION  *UNUSUAL SHORTNESS OF BREATH  *UNUSUAL BRUISING OR BLEEDING  TENDERNESS IN MOUTH AND THROAT WITH OR WITHOUT PRESENCE OF ULCERS  *URINARY PROBLEMS  *BOWEL PROBLEMS  UNUSUAL RASH Items with * indicate a potential emergency and should be followed up as soon as possible.  Feel free to call the clinic should you have any questions or concerns. The clinic phone number is [phone].  Please show the [facility] at check-in to the Emergency Department and triage nurse.     Gemcitabine injection What is this medicine? GEMCITABINE (jem SIT a been) is a chemotherapy drug. This medicine is used to treat many types of cancer like breast cancer, lung cancer, pancreatic cancer, and ovarian cancer. This medicine may be used for other purposes; ask your health care provider or pharmacist if you have questions. COMMON BRAND NAME(S): Gemzar What should I tell my health care provider before I take this medicine? They need to know if you have any of these conditions: -blood disorders -infection -kidney disease -liver disease -recent or ongoing radiation therapy -an unusual or allergic reaction to gemcitabine, other chemotherapy, other medicines, foods, dyes, or preservatives -pregnant or trying to get pregnant -breast-feeding How should I use this medicine? This drug is given as an infusion into a  vein. It is administered in a hospital or clinic by a specially trained health care professional. Talk to your pediatrician regarding the use of this medicine in children. Special care may be needed. Overdosage: If you think you have taken too much of this medicine contact a poison control center or emergency room at once. NOTE: This medicine is only for you. Do not share this medicine with others. What if I miss a dose? It is important not to miss your dose. Call your doctor or health care professional if you are unable to keep an appointment. What may interact with this medicine? -medicines to increase blood counts like filgrastim, pegfilgrastim, sargramostim -some other chemotherapy drugs like cisplatin -vaccines Talk to your doctor or health care professional before taking any of these medicines: -acetaminophen -aspirin -ibuprofen -ketoprofen -naproxen This list may not describe all possible interactions. Give your health care provider a list of all the medicines, herbs, non-prescription drugs, or dietary supplements you use. Also tell them if you smoke, drink alcohol, or use illegal drugs. Some items may interact with your medicine. What should I watch for while using this medicine? Visit your doctor for checks on your progress. This drug may make you feel generally unwell. This is not uncommon, as chemotherapy can affect healthy cells as well as cancer cells. Report any side effects. Continue your course of treatment even though you feel ill unless your doctor tells you to stop. In some cases, you may be given additional medicines to help with side effects. Follow all directions for their use. Call your doctor or health care professional for advice if you get a fever, chills or sore throat, or other  symptoms of a cold or flu. Do not treat yourself. This drug decreases your body's ability to fight infections. Try to avoid being around people who are sick. This medicine may increase your  risk to bruise or bleed. Call your doctor or health care professional if you notice any unusual bleeding. Be careful brushing and flossing your teeth or using a toothpick because you may get an infection or bleed more easily. If you have any dental work done, tell your dentist you are receiving this medicine. Avoid taking products that contain aspirin, acetaminophen, ibuprofen, naproxen, or ketoprofen unless instructed by your doctor. These medicines may hide a fever. Women should inform their doctor if they wish to become pregnant or think they might be pregnant. There is a potential for serious side effects to an unborn child. Talk to your health care professional or pharmacist for more information. Do not breast-feed an infant while taking this medicine. What side effects may I notice from receiving this medicine? Side effects that you should report to your doctor or health care professional as soon as possible: -allergic reactions like skin rash, itching or hives, swelling of the face, lips, or tongue -low blood counts - this medicine may decrease the number of white blood cells, red blood cells and platelets. You may be at increased risk for infections and bleeding. -signs of infection - fever or chills, cough, sore throat, pain or difficulty passing urine -signs of decreased platelets or bleeding - bruising, pinpoint red spots on the skin, black, tarry stools, blood in the urine -signs of decreased red blood cells - unusually weak or tired, fainting spells, lightheadedness -breathing problems -chest pain -mouth sores -nausea and vomiting -pain, swelling, redness at site where injected -pain, tingling, numbness in the hands or feet -stomach pain -swelling of ankles, feet, hands -unusual bleeding Side effects that usually do not require medical attention (report to your doctor or health care professional if they continue or are bothersome): -constipation -diarrhea -hair loss -loss of  appetite -stomach upset This list may not describe all possible side effects. Call your doctor for medical advice about side effects. You may report side effects to FDA at 1-800-FDA-1088. Where should I keep my medicine? This drug is given in a hospital or clinic and will not be stored at home. NOTE: This sheet is a summary. It may not cover all possible information. If you have questions about this medicine, talk to your doctor, pharmacist, or health care provider.  2018 Elsevier/Gold Standard (2007-11-21 18:45:54)

## 2018-04-19 NOTE — Progress Notes (Signed)
Per NP Ria Comment d/t poor port positioning pt to receive tx through PIV.  While pt's hepatic panel is running ok per Ria Comment to release herceptin and admin w/premeds, not to give chemo until labs come back.

## 2018-04-19 NOTE — Progress Notes (Signed)
Kelly Rowe  Telephone:([phone]) [phone] Fax:(336) [phone]     ID: Kelly Rowe DOB: 1970/10/08  MR#: [id number]  DSK#:[id number]  Patient Care Team: Biagio Borg, MD as PCP - [name], MD as Consulting Physician (General Surgery) [name], [name], MD as Consulting Physician (Oncology) [name], MD as Attending Physician (Radiation Oncology) [name], NP as Nurse Practitioner (Obstetrics and Gynecology) Marlinda Mike, PA-C as Referring Physician (Physician Assistant) OTHER MD:   CHIEF COMPLAINT: Triple positive breast cancer  CURRENT TREATMENT: adjuvant chemotherapy   HISTORY OF CURRENT ILLNESS: From the original intake note:  "Kelly Rowe" noticed bruising in her lateral right breast and palpated a mass  on 12/23/2017. She followed up with her gynecologist. She underwent unilateral right diagnostic mammography with tomography and right breast ultrasonography at [facility] on 01/05/2018 showing: breast density category B. There is an irregular highly suspicious mass within the right breast at the 9:30 o'clock upper outer quadrant, measuring 1.8 x 1.1 x 1.5 cm, and located 18 cm from the nipple,  Ultrasonography revealed a single morphologically abnormal lymph node in the RIGHT axilla, with cortical thickness of 6 mm.   Accordingly on 01/11/2018 she proceeded to biopsy of the right breast area in question as well as a suspicious lymph node. The pathology from this procedure showed ([id number]): Invasive ductal carcinoma grade III.  The lymph node biopsied was negative for carcinoma (concordant).. Prognostic indicators significant for: estrogen receptor, 60% positive with weak staining intensity and progesterone receptor, 10% positive with strong staining intensity. Proliferation marker Ki67 at 30%. HER2 amplified with ratios HER2/CEP17 signals 2.26 and average HER2 copies per cell 3.50  The patient's subsequent history is as detailed  below.  INTERVAL HISTORY: Kelly Rowe returns today for follow up and treatment of her triple positive breast cancer accompanied by her mother. She is receiving neoadjuvant chemotherapy with Docetaxel, Carboplatin, Trastuzumab, Pertuzumab given on day 1 of a 21 day cycle.  She receives growth factor support on day 3 with Udenyca.  Pertuzumab was removed after cycle 1 due to diarrhea.  Today is cycle 3 day 1.  The Docetaxel will be replaced with Gemcitabine after cycle 2 due to uncontrolled hyperglycemia from dexamethasone.She will need to get her chemotherapy peripherally today.  Her port is going to be revised on Friday.    REVIEW OF SYSTEMS: Kelly Rowe is doing well today.  She denies any fevers or chills. She is without headaches or vision changes.  She is having normal bowel movements, no bladder issues.  Her blood sugars are improving.  Today her blood sugar is 246.  She is working on her sugars and is taking her diabetes medications Metformin and Glipizide regularly.  Her left axilla wound drainage is improving.    Kelly Rowe has a small bump on her right upper arm she wants me to look at.  She denies any other issues today.     PAST MEDICAL HISTORY: Past Medical History:  Diagnosis Date  . ANEMIA-IRON DEFICIENCY 01/30/2010  . ANXIETY 11/27/2007  . Arthritis    both knees  . Asthma 02/25/2011  . Cancer ([place])    breast  . Carbuncle and furuncle of trunk 04/16/2010  . Chlamydia infection 03/21/2008  . DIABETES MELLITUS, TYPE II 08/02/2007  . Edema 01/30/2010  . ELEVATED BLOOD PRESSURE WITHOUT DIAGNOSIS OF HYPERTENSION 08/02/2007  . Family history of breast cancer   . Family history of colon cancer   . Family history of lung cancer   . FREQUENCY,  URINARY 05/19/2009  . GENITAL HERPES 03/12/2009  . HIV (human immunodeficiency virus infection) ([place]) 2009  . HIV INFECTION 03/12/2009  . HSV (herpes simplex virus) infection   . HYPERLIPIDEMIA 08/02/2007  . Hypertension   . Metrorrhagia 03/21/2008  . PONV  (postoperative nausea and vomiting)   . RETENTION, URINE 05/19/2009  . Trichomonas infection 01/19/2010  . VITAMIN D DEFICIENCY 01/30/2010    PAST SURGICAL HISTORY: Past Surgical History:  Procedure Laterality Date  . ABDOMINAL HYSTERECTOMY  07/22/2010   TAH WITH PRESERVATION OF BOTH TUBES AND OVARIES  . BREAST LUMPECTOMY WITH RADIOACTIVE SEED AND SENTINEL LYMPH NODE BIOPSY Right 02/23/2018   Procedure: BREAST LUMPECTOMY WITH RADIOACTIVE SEED AND SENTINEL LYMPH NODE BIOPSY;  Surgeon: Stark Klein, MD;  Location: [facility];  Service: General;  Laterality: Right;  . CHOLECYSTECTOMY    . ENDOMETRIAL ABLATION  01/11/2008   HER OPTION  . ESOPHAGOGASTRODUODENOSCOPY    . MULTIPLE TOOTH EXTRACTIONS    . PORTACATH PLACEMENT Left 02/23/2018   Procedure: INSERTION PORT-A-CATH;  Surgeon: Stark Klein, MD;  Location: [facility];  Service: General;  Laterality: Left;  . TUBAL LIGATION    . WISDOM TOOTH EXTRACTION      FAMILY HISTORY Family History  Problem Relation Age of Onset  . Prostate cancer Other   . Heart disease Other   . Stroke Other   . Diabetes Mother   . Hypertension Mother   . Diabetes Father   . Cancer Father        COLON and LU NG  . Stroke Paternal Uncle   . Breast cancer Paternal Aunt   . Lung cancer Maternal Grandmother 26       Mesothelioma  . Colon cancer Paternal Grandfather        dx over 33s  . Lung cancer Paternal Aunt   . Breast cancer Paternal Aunt   . Breast cancer Paternal Aunt   . Heart attack Maternal Grandfather 71  . Colon cancer Other        MGM's 5 brothers  The patient's father died at age 57 due to metastatic liver cancer. The patient's mother is alive at 66. The patient's has 1 brother and 3 sisters. There was a paternal grandfather with colon cancer. There were 3 paternal aunts with breast cancer, 2 diagnosed in the 49's and 1 at age 30. There was a maternal grandmother with mesothelioma. The patient otherwise denies a history of ovarian cancer in the family.     GYNECOLOGIC HISTORY:  Patient's last menstrual period was 06/21/2010. Menarche: 47 years old Age at first live birth: 47 years old She is GXP5. Her LMP was December 2011. She is status post partial hysterectomy without oophorectomy. She took oral contraception for 3 years with no complications. She never took HRT.    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  At home are 2 of her sons, Kelly Rowe and Kelly Rowe, and the patient's grandson, Kelly Rowe 37 (who is Kelly Rowe). The patient's oldest is Kelly Rowe age 82 who lives in [place] as a cook. Daughter, Kelly Rowe age 23 works as a Scientist, water quality. Rowe, Kelly Rowe age 37 is disabled. Rowe, Kelly Rowe age 66 lives in [place] as a Microbiologist. Daughter, Kelly Rowe age 71 also is a Microbiologist. The patient has 5 grandchildren and no great grandchildren. The patient does not belong to a church.        ADVANCED DIRECTIVES: Not in place; at the 01/18/2018 visit the patient was given the appropriate documents to complete on notarized  at her discretion   HEALTH MAINTENANCE: Social History   Tobacco Use  . Smoking status: Never Smoker  . Smokeless tobacco: Never Used  Substance Use Topics  . Alcohol use: Yes    Alcohol/week: 0.0 standard drinks    Comment: OCCASIONALLY  . Drug use: No     Colonoscopy: Not yet  PAP: November 2018  Bone density: Never   Allergies  Allergen Reactions  . Levaquin [Levofloxacin In D5w] Nausea And Vomiting and Rash  . Penicillins Swelling and Rash    Has patient had a PCN reaction causing immediate rash, facial/tongue/throat swelling, SOB or lightheadedness with hypotension: Yes Has patient had a PCN reaction causing severe rash involving mucus membranes or skin necrosis: No Has patient had a PCN reaction that required hospitalization: Yes Has patient had a PCN reaction occurring within the last 10 years: No If all of the above answers are "NO", then may proceed with Cephalosporin use.    Current Outpatient Medications   Medication Sig Dispense Refill  . albuterol (PROVENTIL HFA;VENTOLIN HFA) 108 (90 Base) MCG/ACT inhaler Inhale 2 puffs into the lungs every 6 (six) hours as needed for wheezing or shortness of breath.    Marland Kitchen aspirin 81 MG tablet Take 81 mg by mouth daily as needed (chest pain).     . Azelastine-Fluticasone (DYMISTA) 137-50 MCG/ACT SUSP Use as directed 1 spray each side twice per day as needed 23 g 5  . Beclomethasone Dipropionate (QNASL) 80 MCG/ACT AERS Place 2 sprays into the nose daily. 1 Inhaler 1  . blood glucose meter kit and supplies Dispense based on patient and insurance preference. Use up to four times daily as directed. (FOR ICD-10 E10.9, E11.9). 1 each 0  . Diclofenac Sodium (PENNSAID) 2 % SOLN Place 1 application onto the skin 2 (two) times daily. (Patient taking differently: Place 1 application onto the skin 2 (two) times daily as needed. ) 1 Bottle 3  . Dulaglutide (TRULICITY) 1.5 FH/2.1FX SOPN 0.5 ml weekly SQ 6 mL 3  . famotidine (PEPCID) 20 MG tablet Take 1 tablet (20 mg total) by mouth 2 (two) times daily. 60 tablet 3  . ferrous gluconate (IRON 27) 240 (27 FE) MG tablet Take 480 mg by mouth daily.    Marland Kitchen glipiZIDE (GLIPIZIDE XL) 5 MG 24 hr tablet Take 1 tablet (5 mg total) by mouth daily with breakfast. 90 tablet 3  . hydrocortisone (ANUSOL-HC) 2.5 % rectal cream Place 1 application rectally 2 (two) times daily. 30 g 0  . ibuprofen (ADVIL,MOTRIN) 800 MG tablet Take 1 tablet (800 mg total) by mouth every 8 (eight) hours as needed. (Patient taking differently: Take 800 mg by mouth daily as needed for moderate pain. ) 60 tablet 0  . lidocaine-prilocaine (EMLA) cream Apply to affected area once (Patient taking differently: Apply 1 application topically once. Apply to affected area once) 30 g 3  . LORazepam (ATIVAN) 0.5 MG tablet Take 1 tablet (0.5 mg total) by mouth at bedtime as needed (Nausea or vomiting). 30 tablet 0  . metFORMIN (GLUCOPHAGE-XR) 500 MG 24 hr tablet Take 2 tablets (1,000  mg total) by mouth daily with breakfast. Annual appt is due must see provider for future refills 180 tablet 3  . Multiple Vitamin (MULTIVITAMIN WITH MINERALS) TABS tablet Take 1 tablet by mouth daily.    . naproxen (NAPROSYN) 500 MG tablet Take 500 mg by mouth 2 (two) times daily as needed for moderate pain.    Marland Kitchen omeprazole (PRILOSEC) 40 MG capsule  Take 1 capsule (40 mg total) by mouth daily. 30 capsule 0  . ondansetron (ZOFRAN) 8 MG tablet Take 1 tablet (8 mg total) by mouth every 8 (eight) hours as needed for nausea or vomiting. 20 tablet 0  . oxyCODONE (OXY IR/ROXICODONE) 5 MG immediate release tablet Take 1-2 tablets (5-10 mg total) by mouth every 6 (six) hours as needed for moderate pain, severe pain or breakthrough pain. 30 tablet 0  . potassium chloride (K-DUR) 10 MEQ tablet Take 2 tablets (20 mEq total) by mouth 2 (two) times daily. 20 tablet 0  . prochlorperazine (COMPAZINE) 10 MG tablet Take 1 tablet (10 mg total) by mouth every 6 (six) hours as needed (Nausea or vomiting). 30 tablet 1  . valACYclovir (VALTREX) 500 MG tablet Take twice daily for 3-5 days 30 tablet 12  . zolpidem (AMBIEN) 10 MG tablet Take 1 tablet (10 mg total) by mouth at bedtime as needed for sleep. 90 tablet 1  . dexamethasone (DECADRON) 4 MG tablet Take 2 tablets (8 mg total) by mouth 2 (two) times daily. Start the day before Taxotere. Take once the day after, then 2 times a day x 2d. (Patient not taking: Reported on 04/19/2018) 30 tablet 1   No current facility-administered medications for this visit.     OBJECTIVE:  Vitals:   04/19/18 0941  BP: 134/90  Pulse: 89  Resp: 16  Temp: 98.3 F (36.8 C)  SpO2: 100%     Body mass index is 45.36 kg/m.   Wt Readings from Last 3 Encounters:  04/19/18 248 lb (112.5 kg)  04/10/18 246 lb (111.6 kg)  04/05/18 240 lb 6.4 oz (109 kg)  ECOG FS:2 GENERAL: Patient is a well appearing female lying down HEENT:  Sclerae anicteric.  Oropharynx clear and moist. No ulcerations  or evidence of oropharyngeal candidiasis. Neck is supple.  NODES:  No cervical, supraclavicular, or axillary lymphadenopathy palpated.  BREAST EXAM: no sign of recurrence, noted to right breast LUNGS:  Clear to auscultation bilaterally.  No wheezes or rhonchi. HEART:  Regular rate and rhythm. No murmur appreciated. ABDOMEN:  Soft, nontender.  Positive, normoactive bowel sounds. No organomegaly palpated. SKIN:  Clear with no obvious rashes or skin changes. No nail dyscrasia.  NEURO:  Nonfocal. Well oriented.  Appropriate affect.     LAB RESULTS:  CMP     Component Value Date/Time   NA 140 04/19/2018 0916   K 3.4 (L) 04/19/2018 0916   CL 100 04/19/2018 0916   CO2 29 04/19/2018 0916   GLUCOSE 246 (H) 04/19/2018 0916   BUN 6 04/19/2018 0916   CREATININE 0.84 04/19/2018 0916   CALCIUM 9.0 04/19/2018 0916   PROT 6.7 04/07/2018 1123   ALBUMIN 3.3 (L) 04/07/2018 1123   AST 41 04/07/2018 1123   ALT 52 (H) 04/07/2018 1123   ALKPHOS 270 (H) 04/07/2018 1123   BILITOT 0.4 04/07/2018 1123   GFRNONAA >60 04/19/2018 0916   GFRAA >60 04/19/2018 0916    No results found for: TOTALPROTELP, ALBUMINELP, A1GS, A2GS, BETS, BETA2SER, GAMS, MSPIKE, SPEI  No results found for: KPAFRELGTCHN, LAMBDASER, KAPLAMBRATIO  Lab Results  Component Value Date   WBC 4.2 04/19/2018   NEUTROABS 1.8 04/19/2018   HGB 11.0 (L) 04/19/2018   HCT 33.6 (L) 04/19/2018   MCV 85.8 04/19/2018   PLT 313 04/19/2018    @LASTCHEMISTRY @  No results found for: LABCA2  No components found for: WEXHBZ169  No results for input(s): INR in the last 168 hours.  No results found for: LABCA2  No results found for: ONG295  No results found for: MWU132  No results found for: GMW102  No results found for: CA2729  No components found for: HGQUANT  No results found for: CEA1 / No results found for: CEA1   No results found for: AFPTUMOR  No results found for: CHROMOGRNA  No results found for: PSA1  Appointment  on 04/19/2018  Component Date Value Ref Range Status  . WBC Count 04/19/2018 4.2  3.9 - 10.3 K/uL Final  . RBC 04/19/2018 3.92  3.70 - 5.45 MIL/uL Final  . Hemoglobin 04/19/2018 11.0* 11.6 - 15.9 g/dL Final  . HCT 04/19/2018 33.6* 34.8 - 46.6 % Final  . MCV 04/19/2018 85.8  79.5 - 101.0 fL Final  . MCH 04/19/2018 27.9  25.1 - 34.0 pg Final  . MCHC 04/19/2018 32.6  31.5 - 36.0 g/dL Final  . RDW 04/19/2018 15.8* 11.2 - 14.5 % Final  . Platelet Count 04/19/2018 313  145 - 400 K/uL Final  . Neutrophils Relative % 04/19/2018 44  % Final  . Neutro Abs 04/19/2018 1.8  1.5 - 6.5 K/uL Final  . Lymphocytes Relative 04/19/2018 42  % Final  . Lymphs Abs 04/19/2018 1.7  0.9 - 3.3 K/uL Final  . Monocytes Relative 04/19/2018 13  % Final  . Monocytes Absolute 04/19/2018 0.5  0.1 - 0.9 K/uL Final  . Eosinophils Relative 04/19/2018 0  % Final  . Eosinophils Absolute 04/19/2018 0.0  0.0 - 0.5 K/uL Final  . Basophils Relative 04/19/2018 1  % Final  . Basophils Absolute 04/19/2018 0.0  0.0 - 0.1 K/uL Final   Performed at [facility], [place] [address], [place], [place] [address]  . Sodium 04/19/2018 140  135 - 145 mmol/L Final  . Potassium 04/19/2018 3.4* 3.5 - 5.1 mmol/L Final  . Chloride 04/19/2018 100  98 - 111 mmol/L Final  . CO2 04/19/2018 29  22 - 32 mmol/L Final  . Glucose, Bld 04/19/2018 246* 70 - 99 mg/dL Final  . BUN 04/19/2018 6  6 - 20 mg/dL Final  . Creatinine 04/19/2018 0.84  0.44 - 1.00 mg/dL Final  . Calcium 04/19/2018 9.0  8.9 - 10.3 mg/dL Final  . GFR, Est Non Af Am 04/19/2018 >60  >60 mL/min Final  . GFR, Est AFR Am 04/19/2018 >60  >60 mL/min Final   Comment: (NOTE) The eGFR has been calculated using the CKD EPI equation. This calculation has not been validated in all clinical situations. eGFR's persistently <60 mL/min signify possible Chronic Kidney Disease.   Kelly Rowe gap 04/19/2018 11  5 - 15 Final   Performed at [facility], [address] [place], [place] [address]    (this displays the last labs from the last 3 days)  No results found for: TOTALPROTELP, ALBUMINELP, A1GS, A2GS, BETS, BETA2SER, GAMS, MSPIKE, SPEI (this displays SPEP labs)  No results found for: KPAFRELGTCHN, LAMBDASER, KAPLAMBRATIO (kappa/lambda light chains)  No results found for: HGBA, HGBA2QUANT, HGBFQUANT, HGBSQUAN (Hemoglobinopathy evaluation)   No results found for: LDH  Lab Results  Component Value Date   IRON 35 (L) 01/30/2010   IRONPCTSAT 8.8 (L) 01/30/2010   (Iron and TIBC)  No results found for: FERRITIN  Urinalysis    Component Value Date/Time   COLORURINE RED (A) 04/01/2018 0202   APPEARANCEUR HAZY (A) 04/01/2018 0202   LABSPEC 1.021 04/01/2018 0202   PHURINE 6.0 04/01/2018 0202   GLUCOSEU >=500 (  A) 04/01/2018 0202   GLUCOSEU NEGATIVE 09/27/2017 1002   HGBUR LARGE (A) 04/01/2018 0202   BILIRUBINUR NEGATIVE 04/01/2018 0202   KETONESUR 5 (A) 04/01/2018 0202   PROTEINUR 100 (A) 04/01/2018 0202   UROBILINOGEN 0.2 09/27/2017 1002   NITRITE NEGATIVE 04/01/2018 0202   LEUKOCYTESUR NEGATIVE 04/01/2018 0202     STUDIES: Dg Chest 2 View  Result Date: 04/07/2018 CLINICAL DATA:  Check Port-A-Cath placement. History of breast cancer. EXAM: CHEST - 2 VIEW COMPARISON:  02/23/2018 FINDINGS: Left subclavian approach injectable port terminates at the expected location of the junction of brachiocephalic vein and superior vena cava. Mild kinking of the catheter is seen at the level of the second rib laterally. Cardiomediastinal silhouette is normal. Mediastinal contours appear intact. There is no evidence of focal airspace consolidation, pleural effusion or pneumothorax. Osseous structures are without acute abnormality. Postsurgical changes in the right chest wall. IMPRESSION: Left subclavian approach injectable port terminates at the expected location of the junction of the brachiocephalic vein and superior vena cava. Mild kinking of  the catheter seen at the level of the lateral second rib. No pneumothorax. Electronically Signed   By: Fidela Salisbury M.D.   On: 04/07/2018 11:15   Ct Abdomen Pelvis W Contrast  Result Date: 04/01/2018 CLINICAL DATA:  Abdominal cramping. Hematuria. Breast cancer patient with last chemotherapy on Wednesday. EXAM: CT ABDOMEN AND PELVIS WITH CONTRAST TECHNIQUE: Multidetector CT imaging of the abdomen and pelvis was performed using the standard protocol following bolus administration of intravenous contrast. CONTRAST:  159m ISOVUE-300 IOPAMIDOL (ISOVUE-300) INJECTION 61% COMPARISON:  None. FINDINGS: Lower chest: Mild dependent changes in the lung bases. Hepatobiliary: Mild diffuse fatty infiltration of the liver. No focal liver abnormality is seen. Status post cholecystectomy. No biliary dilatation. Pancreas: Unremarkable. No pancreatic ductal dilatation or surrounding inflammatory changes. Spleen: Normal in size without focal abnormality. Adrenals/Urinary Tract: No adrenal gland nodules. Subcentimeter cyst in the left kidney. Kidneys are symmetrical in size. There is mild right hydronephrosis and hydroureter. No obstructing ureteral stone is identified. This could indicate a non radiopaque stone, stricture, reflux or pyelonephritis. 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 The lower Rowe moiety in a duplicated system is susceptible to reflux. The bladder is unremarkable. Stomach/Bowel: Stomach is within normal limits. Appendix appears normal. No evidence of bowel wall thickening, distention, or inflammatory changes. Vascular/Lymphatic: No significant vascular findings are present. No enlarged abdominal or pelvic lymph nodes. Reproductive: Status post hysterectomy. No adnexal masses. Other: No abdominal wall hernia or abnormality. No abdominopelvic ascites. Musculoskeletal: No acute or significant osseous findings. IMPRESSION: 1. 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 No obstructing ureteral stone is identified. This could indicate a non radiopaque stone, stricture, reflux or pyelonephritis. The lower Rowe moiety in a duplicated system is susceptible to reflux. 2. Mild diffuse fatty infiltration of the liver. 3. Status post cholecystectomy and hysterectomy. Electronically Signed   By: WLucienne CapersM.D.   On: 04/01/2018 06:34    ELIGIBLE FOR AVAILABLE RESEARCH PROTOCOL: no  ASSESSMENT: 47y.o. [place] status post right breast upper outer quadrant biopsy 01/11/2018 for a clinical T1 N0, stage IA invasive ductal carcinoma, grade 3, estrogen and progesterone receptor positive, HER-2 amplified, with an MIB-1 of 30%.  (1) status post right lumpectomy and sentinel lymph node sampling 02/23/2018 for a pT2 pN1, stage IB invasive ductal carcinoma, grade 3, with close but negative margins  (  2) adjuvant chemotherapy consisting of carboplatin, docetaxel, trastuzumab and Pertuzumab every 21 days x 6 to start 03/08/2018  (a) pertuzumab stopped after cycle 1 due to diarrhea  (b) Docetaxel changed to Gemcitabine after cycle 2 due to persistent hyperglycemia  (3) anti-HER-2 immunotherapy to start concurrently with chemotherapy  (a) baseline echocardiogram on 02/07/2018 showed an ejection fraction in the 55-60% range  (4) adjuvant radiation as appropriate  (5) antiestrogens to follow at the completion of local treatment  (6) genetics testing through [facility] Multi-cancer and Breast panel on 01/13/2018 showed: no deleterious mutations. The following genes were evaluated for sequence changes and exonic deletions/duplications: ALK, APC, ATM, AXIN2, BAP1, BARD1, BLM, BMPR1A, BRCA1, BRCA2, BRIP1, CASR, CDC73, CDH1, CDK4, CDKN1B, CDKN1C,CDKN2A (p14ARF), CDKN2A (p16INK4a), CEBPA, CHEK2, CTNNA1, DICER1, DIS3L2, EPCAM*, FH, FLCN, GATA2, GPC3, GREM1*, HRAS, KIT, MAX, MEN1, MET, MLH1,  MSH2, MSH3, MSH6, MUTYH, NBN, NF1, NF2, PALB2, PDGFRA, PHOX2B*, PMS2, POLD1,Rowe, POT1, PRKAR1A, PTCH1, PTEN, RAD50, RAD51C, RAD51D, RB1, RECQL4, RET, RUNX1, SDHAF2, SDHB, SDHC, SDHD,SMAD4, SMARCA4, SMARCB1, SMARCE1, STK11, SUFU, TERC, TERT, TMEM127, TP53, TSC1, TSC2, VHL, WRN*, WT1.The following genes were evaluated for sequence changes only: EGFR*, HOXB13*, MITF*, NTHL1*, SDHA  PLAN: Kelly Rowe is doing well today.  She and I reviewed her labs.  We will need liver function tests before she receives chemotherapy due to her h/o of slightly elevated liver enzymes.  She is due for Gemcitabine, Carbo, Trastuzumab today.  She can receive it but peripherally. I reviewed Kaisley in detail with Dr. Lindi Adie, particularly her co-morbidities, and he is ok with her undergoing treatment today and port revision on Friday.  Oveta was quite nauseated last time and improved with IV hydration.  She didn't take that many of her compazine tablets.  We re reviewed her anti emetics again today.    Melah will return on Friday for Udenyca after she undergoes her port revision. She will return and see [place] in one week for labs and f/u.  I have requested IV fluids be added onto that day as well.  Jaymes knows to call for any questions or concerns prior to her next appointment with [place].  We can certainly see her sooner if needed.  A total of (30) minutes of face-to-face time was spent with this patient with greater than 50% of that time in counseling and care-coordination.   Wilber Bihari, NP  04/19/18 10:02 AM Medical Oncology and Hematology [facility] [address] [place], [place] [address] Tel. [phone]    Fax. [phone]

## 2018-04-19 NOTE — Addendum Note (Signed)
Addended by: Scot Dock on: 04/19/2018 11:14 AM   Modules accepted: Orders

## 2018-04-20 ENCOUNTER — Ambulatory Visit: Payer: BC Managed Care – PPO | Attending: General Surgery | Admitting: Physical Therapy

## 2018-04-20 ENCOUNTER — Other Ambulatory Visit: Payer: Self-pay

## 2018-04-20 ENCOUNTER — Encounter (HOSPITAL_COMMUNITY): Payer: Self-pay | Admitting: *Deleted

## 2018-04-20 ENCOUNTER — Telehealth: Payer: Self-pay

## 2018-04-20 NOTE — Telephone Encounter (Signed)
Called pt to follow up first time gemzar chemo form yesterday. Reviewed pt home meds and how to take if she gets nausea/vomiting. Encouraged plenty of fluid intake and to monitor for s/s infection and fatigue. Pt verbalized understanding and is doing really well without any symptoms or side effects.

## 2018-04-20 NOTE — Anesthesia Preprocedure Evaluation (Signed)
Anesthesia Evaluation  Patient identified by MRN, date of birth, ID band Patient awake    Reviewed: Allergy & Precautions, NPO status   History of Anesthesia Complications (+) PONV and history of anesthetic complications  Airway Mallampati: II       Dental no notable dental hx. (+) Teeth Intact   Pulmonary asthma ,    Pulmonary exam normal breath sounds clear to auscultation       Cardiovascular hypertension, Normal cardiovascular exam Rhythm:Regular Rate:Normal     Neuro/Psych PSYCHIATRIC DISORDERS Anxiety    GI/Hepatic GERD  ,  Endo/Other  diabetes, Oral Hypoglycemic AgentsMorbid obesity  Renal/GU      Musculoskeletal  (+) Arthritis ,   Abdominal (+) + obese,   Peds  Hematology  (+) anemia ,   Anesthesia Other Findings Kelly Rowe  ECHOCARDIOGRAM COMPLETE  Order# [id number]  Reading physician: Kelly Dresser, MD Ordering physician: Kelly Cruel, MD Study date: 02/07/18 Study Result   Result status: Final result                             *[facility]*                         [address], [place] [address]                            [phone]  ------------------------------------------------------------------- Transthoracic Echocardiography  Patient:    Kelly, Rowe MR #:       [id number] Study Date: 02/07/2018 Gender:     F Age:        47 Height:     157.5 cm Weight:     120.4 kg BSA:        2.37 m^2 Pt. Status: Room:   ORDERING     Kelly Rowe  REFERRING    Kelly Rowe  ATTENDING    Kelly Rowe, M.D.  [place], RDCS  PERFORMING   [facility], Outpatient  cc:  ------------------------------------------------------------------- LV EF: 55% -    60%  ------------------------------------------------------------------- Indications:      V58.11 Chemotherapy Evaluation.  ------------------------------------------------------------------- History:   PMH:  Baseline echo. Breast cancer.  ------------------------------------------------------------------- Study Conclusions  - Left ventricle: The cavity size was normal. Wall thickness was   increased in a pattern of mild LVH. Systolic function was normal.   The estimated ejection fraction was in the range of 55% to 60%.   GLS -17.9%. Wall motion was normal; there were no regional wall   motion abnormalities. Doppler parameters are consistent with   abnormal left ventricular relaxation (grade 1 diastolic   dysfunction). - Aortic valve: There was no stenosis. - Mitral valve: There was trivial regurgitation. - Left atrium    Reproductive/Obstetrics                             Anesthesia Physical  Anesthesia Plan  ASA: III  Anesthesia Plan: General   Post-op Pain Management:  Regional for Post-op pain  Induction:   PONV Risk Score and Plan: 4 or greater and Ondansetron, Midazolam, Scopolamine patch - Pre-op and Treatment may vary due to age or medical condition  Airway Management Planned: LMA  Additional Equipment:   Intra-op Plan:   Post-operative Plan: Extubation in OR  Informed Consent: I have reviewed the patients History and Physical, chart, labs and discussed the procedure including the risks, benefits and alternatives for the proposed anesthesia with the patient or authorized representative who has indicated his/her understanding and acceptance.   Dental advisory given  Plan Discussed with: CRNA and Surgeon  Anesthesia Plan Comments:         Anesthesia Quick Evaluation

## 2018-04-20 NOTE — Progress Notes (Signed)
FMLA successfully faxed to [employer] at [phone]. Mailed copy to patient address on file.

## 2018-04-21 ENCOUNTER — Ambulatory Visit (HOSPITAL_COMMUNITY): Payer: BC Managed Care – PPO

## 2018-04-21 ENCOUNTER — Ambulatory Visit (HOSPITAL_COMMUNITY): Payer: BC Managed Care – PPO | Admitting: Anesthesiology

## 2018-04-21 ENCOUNTER — Encounter (HOSPITAL_COMMUNITY): Payer: Self-pay

## 2018-04-21 ENCOUNTER — Inpatient Hospital Stay: Payer: BC Managed Care – PPO

## 2018-04-21 ENCOUNTER — Ambulatory Visit (HOSPITAL_COMMUNITY)
Admission: RE | Admit: 2018-04-21 | Discharge: 2018-04-21 | Disposition: A | Discharge status: Home / Self Care | Payer: BC Managed Care – PPO | Source: Ambulatory Visit | Attending: Oncology | Admitting: Oncology

## 2018-04-21 ENCOUNTER — Encounter (HOSPITAL_COMMUNITY)
Admission: RE | Disposition: A | Discharge status: Home / Self Care | Payer: Self-pay | Source: Ambulatory Visit | Attending: General Surgery

## 2018-04-21 DIAGNOSIS — Z7951 Long term (current) use of inhaled steroids: Secondary | ICD-10-CM | POA: Diagnosis not present

## 2018-04-21 DIAGNOSIS — Z7984 Long term (current) use of oral hypoglycemic drugs: Secondary | ICD-10-CM | POA: Insufficient documentation

## 2018-04-21 DIAGNOSIS — K219 Gastro-esophageal reflux disease without esophagitis: Secondary | ICD-10-CM | POA: Diagnosis not present

## 2018-04-21 DIAGNOSIS — B20 Human immunodeficiency virus [HIV] disease: Secondary | ICD-10-CM | POA: Insufficient documentation

## 2018-04-21 DIAGNOSIS — Z7982 Long term (current) use of aspirin: Secondary | ICD-10-CM | POA: Diagnosis not present

## 2018-04-21 DIAGNOSIS — Z880 Allergy status to penicillin: Secondary | ICD-10-CM | POA: Insufficient documentation

## 2018-04-21 DIAGNOSIS — M1990 Unspecified osteoarthritis, unspecified site: Secondary | ICD-10-CM | POA: Diagnosis not present

## 2018-04-21 DIAGNOSIS — T82524A Displacement of infusion catheter, initial encounter: Secondary | ICD-10-CM | POA: Diagnosis present

## 2018-04-21 DIAGNOSIS — Z5112 Encounter for antineoplastic immunotherapy: Secondary | ICD-10-CM | POA: Diagnosis not present

## 2018-04-21 DIAGNOSIS — F419 Anxiety disorder, unspecified: Secondary | ICD-10-CM | POA: Insufficient documentation

## 2018-04-21 DIAGNOSIS — E785 Hyperlipidemia, unspecified: Secondary | ICD-10-CM | POA: Insufficient documentation

## 2018-04-21 DIAGNOSIS — I10 Essential (primary) hypertension: Secondary | ICD-10-CM | POA: Insufficient documentation

## 2018-04-21 DIAGNOSIS — J45909 Unspecified asthma, uncomplicated: Secondary | ICD-10-CM | POA: Insufficient documentation

## 2018-04-21 DIAGNOSIS — C50411 Malignant neoplasm of upper-outer quadrant of right female breast: Secondary | ICD-10-CM

## 2018-04-21 DIAGNOSIS — E119 Type 2 diabetes mellitus without complications: Secondary | ICD-10-CM | POA: Diagnosis not present

## 2018-04-21 DIAGNOSIS — C50911 Malignant neoplasm of unspecified site of right female breast: Secondary | ICD-10-CM | POA: Diagnosis not present

## 2018-04-21 DIAGNOSIS — Z170 Estrogen receptor positive status [ER+]: Secondary | ICD-10-CM

## 2018-04-21 DIAGNOSIS — Y712 Prosthetic and other implants, materials and accessory cardiovascular devices associated with adverse incidents: Secondary | ICD-10-CM | POA: Diagnosis not present

## 2018-04-21 DIAGNOSIS — Z95828 Presence of other vascular implants and grafts: Secondary | ICD-10-CM

## 2018-04-21 HISTORY — DX: Gastro-esophageal reflux disease without esophagitis: K21.9

## 2018-04-21 HISTORY — DX: Personal history of urinary calculi: Z87.442

## 2018-04-21 HISTORY — PX: PORTACATH PLACEMENT: SHX2246

## 2018-04-21 LAB — GLUCOSE, CAPILLARY
Glucose-Capillary: 181 mg/dL — ABNORMAL HIGH (ref 70–99)
Glucose-Capillary: 191 mg/dL — ABNORMAL HIGH (ref 70–99)

## 2018-04-21 SURGERY — INSERTION, TUNNELED CENTRAL VENOUS DEVICE, WITH PORT
Anesthesia: General

## 2018-04-21 MED ORDER — HEPARIN SOD (PORK) LOCK FLUSH 100 UNIT/ML IV SOLN
INTRAVENOUS | Status: AC
  Filled 2018-04-21: qty 5

## 2018-04-21 MED ORDER — ACETAMINOPHEN 500 MG PO TABS
1000.0000 mg | ORAL_TABLET | ORAL | Status: AC
  Administered 2018-04-21: 1000 mg via ORAL
  Filled 2018-04-21: qty 2

## 2018-04-21 MED ORDER — LACTATED RINGERS IV SOLN
INTRAVENOUS | Status: DC
  Administered 2018-04-21 (×2): via INTRAVENOUS

## 2018-04-21 MED ORDER — MIDAZOLAM HCL 2 MG/2ML IJ SOLN
INTRAMUSCULAR | Status: DC | PRN
  Administered 2018-04-21: 2 mg via INTRAVENOUS

## 2018-04-21 MED ORDER — DEXAMETHASONE SODIUM PHOSPHATE 10 MG/ML IJ SOLN
INTRAMUSCULAR | Status: DC | PRN
  Administered 2018-04-21: 10 mg via INTRAVENOUS

## 2018-04-21 MED ORDER — CLINDAMYCIN PHOSPHATE 900 MG/50ML IV SOLN
900.0000 mg | INTRAVENOUS | Status: AC
  Administered 2018-04-21: 900 mg via INTRAVENOUS
  Filled 2018-04-21: qty 50

## 2018-04-21 MED ORDER — OXYCODONE HCL 5 MG/5ML PO SOLN
5.0000 mg | Freq: Once | ORAL | Status: DC | PRN
  Filled 2018-04-21: qty 5

## 2018-04-21 MED ORDER — VASOPRESSIN 20 UNIT/ML IV SOLN
INTRAVENOUS | Status: DC | PRN
  Administered 2018-04-21 (×3): 1 [IU] via INTRAVENOUS

## 2018-04-21 MED ORDER — DEXAMETHASONE SODIUM PHOSPHATE 10 MG/ML IJ SOLN
INTRAMUSCULAR | Status: AC
  Filled 2018-04-21: qty 1

## 2018-04-21 MED ORDER — ALBUMIN HUMAN 5 % IV SOLN
INTRAVENOUS | Status: DC | PRN
  Administered 2018-04-21: 09:00:00 via INTRAVENOUS

## 2018-04-21 MED ORDER — FENTANYL CITRATE (PF) 100 MCG/2ML IJ SOLN: 25.0000 ug | INTRAMUSCULAR | Status: DC | PRN

## 2018-04-21 MED ORDER — ONDANSETRON HCL 4 MG/2ML IJ SOLN
INTRAMUSCULAR | Status: DC | PRN
  Administered 2018-04-21: 4 mg via INTRAVENOUS

## 2018-04-21 MED ORDER — HEPARIN SOD (PORK) LOCK FLUSH 100 UNIT/ML IV SOLN
INTRAVENOUS | Status: DC | PRN
  Administered 2018-04-21: 500 [IU] via INTRAVENOUS

## 2018-04-21 MED ORDER — PROMETHAZINE HCL 25 MG/ML IJ SOLN: 6.2500 mg | INTRAMUSCULAR | Status: DC | PRN

## 2018-04-21 MED ORDER — SCOPOLAMINE 1 MG/3DAYS TD PT72
MEDICATED_PATCH | TRANSDERMAL | Status: AC
  Filled 2018-04-21: qty 1

## 2018-04-21 MED ORDER — SODIUM CHLORIDE 0.9 % IV SOLN
Freq: Once | INTRAVENOUS | Status: AC
  Administered 2018-04-21: 500 mL
  Filled 2018-04-21: qty 1.2

## 2018-04-21 MED ORDER — PHENYLEPHRINE 40 MCG/ML (10ML) SYRINGE FOR IV PUSH (FOR BLOOD PRESSURE SUPPORT)
PREFILLED_SYRINGE | INTRAVENOUS | Status: AC
  Filled 2018-04-21: qty 20

## 2018-04-21 MED ORDER — FENTANYL CITRATE (PF) 100 MCG/2ML IJ SOLN
INTRAMUSCULAR | Status: DC | PRN
  Administered 2018-04-21 (×2): 25 ug via INTRAVENOUS

## 2018-04-21 MED ORDER — PEGFILGRASTIM-CBQV 6 MG/0.6ML ~~LOC~~ SOSY
PREFILLED_SYRINGE | SUBCUTANEOUS | Status: AC
  Filled 2018-04-21: qty 0.6

## 2018-04-21 MED ORDER — VASOPRESSIN 20 UNIT/ML IV SOLN
INTRAVENOUS | Status: AC
  Filled 2018-04-21: qty 1

## 2018-04-21 MED ORDER — SODIUM CHLORIDE 0.9 % IV SOLN: INTRAVENOUS | Status: DC

## 2018-04-21 MED ORDER — BUPIVACAINE-EPINEPHRINE 0.25% -1:200000 IJ SOLN
INTRAMUSCULAR | Status: DC | PRN
  Administered 2018-04-21: 10 mL

## 2018-04-21 MED ORDER — MIDAZOLAM HCL 2 MG/2ML IJ SOLN
INTRAMUSCULAR | Status: AC
  Filled 2018-04-21: qty 2

## 2018-04-21 MED ORDER — PROPOFOL 10 MG/ML IV BOLUS
INTRAVENOUS | Status: AC
  Filled 2018-04-21: qty 20

## 2018-04-21 MED ORDER — PHENYLEPHRINE 40 MCG/ML (10ML) SYRINGE FOR IV PUSH (FOR BLOOD PRESSURE SUPPORT)
PREFILLED_SYRINGE | INTRAVENOUS | Status: DC | PRN
  Administered 2018-04-21 (×7): 80 ug via INTRAVENOUS

## 2018-04-21 MED ORDER — LIDOCAINE HCL (PF) 1 % IJ SOLN
INTRAMUSCULAR | Status: AC
  Filled 2018-04-21: qty 30

## 2018-04-21 MED ORDER — ONDANSETRON HCL 4 MG/2ML IJ SOLN
INTRAMUSCULAR | Status: AC
  Filled 2018-04-21: qty 2

## 2018-04-21 MED ORDER — PHENYLEPHRINE 40 MCG/ML (10ML) SYRINGE FOR IV PUSH (FOR BLOOD PRESSURE SUPPORT)
PREFILLED_SYRINGE | INTRAVENOUS | Status: AC
  Filled 2018-04-21: qty 10

## 2018-04-21 MED ORDER — OXYCODONE HCL 5 MG PO TABS: 5.0000 mg | ORAL_TABLET | Freq: Four times a day (QID) | ORAL | 0 refills | Status: DC | PRN

## 2018-04-21 MED ORDER — LIDOCAINE HCL 1 % IJ SOLN
INTRAMUSCULAR | Status: DC | PRN
  Administered 2018-04-21: 10 mL

## 2018-04-21 MED ORDER — SCOPOLAMINE 1 MG/3DAYS TD PT72
1.0000 | MEDICATED_PATCH | TRANSDERMAL | Status: DC
  Administered 2018-04-21: 1 via TRANSDERMAL

## 2018-04-21 MED ORDER — MEPERIDINE HCL 50 MG/ML IJ SOLN: 6.2500 mg | INTRAMUSCULAR | Status: DC | PRN

## 2018-04-21 MED ORDER — LIDOCAINE 2% (20 MG/ML) 5 ML SYRINGE
INTRAMUSCULAR | Status: AC
  Filled 2018-04-21: qty 5

## 2018-04-21 MED ORDER — ALBUMIN HUMAN 5 % IV SOLN
INTRAVENOUS | Status: AC
  Filled 2018-04-21: qty 250

## 2018-04-21 MED ORDER — FENTANYL CITRATE (PF) 100 MCG/2ML IJ SOLN
INTRAMUSCULAR | Status: AC
  Filled 2018-04-21: qty 2

## 2018-04-21 MED ORDER — CHLORHEXIDINE GLUCONATE CLOTH 2 % EX PADS: 6.0000 | MEDICATED_PAD | Freq: Once | CUTANEOUS | Status: DC

## 2018-04-21 MED ORDER — BUPIVACAINE-EPINEPHRINE (PF) 0.25% -1:200000 IJ SOLN
INTRAMUSCULAR | Status: AC
  Filled 2018-04-21: qty 30

## 2018-04-21 MED ORDER — PROPOFOL 10 MG/ML IV BOLUS
INTRAVENOUS | Status: DC | PRN
  Administered 2018-04-21: 200 mg via INTRAVENOUS

## 2018-04-21 MED ORDER — LIDOCAINE 2% (20 MG/ML) 5 ML SYRINGE
INTRAMUSCULAR | Status: DC | PRN
  Administered 2018-04-21: 100 mg via INTRAVENOUS

## 2018-04-21 MED ORDER — PEGFILGRASTIM-CBQV 6 MG/0.6ML ~~LOC~~ SOSY
6.0000 mg | PREFILLED_SYRINGE | Freq: Once | SUBCUTANEOUS | Status: AC
  Administered 2018-04-21: 6 mg via SUBCUTANEOUS

## 2018-04-21 MED ORDER — OXYCODONE HCL 5 MG PO TABS: 5.0000 mg | ORAL_TABLET | Freq: Once | ORAL | Status: DC | PRN

## 2018-04-21 MED ORDER — LACTATED RINGERS IV BOLUS
1000.0000 mL | Freq: Once | INTRAVENOUS | Status: AC
  Administered 2018-04-21: 1000 mL via INTRAVENOUS

## 2018-04-21 MED ORDER — CELECOXIB 200 MG PO CAPS
200.0000 mg | ORAL_CAPSULE | ORAL | Status: AC
  Administered 2018-04-21: 200 mg via ORAL
  Filled 2018-04-21: qty 1

## 2018-04-21 SURGICAL SUPPLY — 33 items
BAG DECANTER FOR FLEXI CONT (MISCELLANEOUS) ×2 IMPLANT
BLADE HEX COATED 2.75 (ELECTRODE) ×2 IMPLANT
BLADE SURG 15 STRL LF DISP TIS (BLADE) ×1 IMPLANT
BLADE SURG 15 STRL SS (BLADE) ×1
BLADE SURG SZ11 CARB STEEL (BLADE) ×2 IMPLANT
CHLORAPREP W/TINT 26ML (MISCELLANEOUS) ×2 IMPLANT
COVER SURGICAL LIGHT HANDLE (MISCELLANEOUS) ×2 IMPLANT
DECANTER SPIKE VIAL GLASS SM (MISCELLANEOUS) ×2 IMPLANT
DERMABOND ADVANCED (GAUZE/BANDAGES/DRESSINGS) ×1
DERMABOND ADVANCED .7 DNX12 (GAUZE/BANDAGES/DRESSINGS) ×1 IMPLANT
DRAPE C-ARM 42X120 X-RAY (DRAPES) ×2 IMPLANT
DRAPE LAPAROTOMY TRNSV 102X78 (DRAPE) ×2 IMPLANT
DRAPE UTILITY XL STRL (DRAPES) ×2 IMPLANT
ELECT PENCIL ROCKER SW 15FT (MISCELLANEOUS) ×2 IMPLANT
ELECT REM PT RETURN 15FT ADLT (MISCELLANEOUS) ×2 IMPLANT
GAUZE 4X4 16PLY RFD (DISPOSABLE) ×2 IMPLANT
GLOVE BIO SURGEON STRL SZ 6 (GLOVE) ×2 IMPLANT
GLOVE INDICATOR 6.5 STRL GRN (GLOVE) ×2 IMPLANT
GOWN STRL REUS W/TWL 2XL LVL3 (GOWN DISPOSABLE) ×2 IMPLANT
GOWN STRL REUS W/TWL XL LVL3 (GOWN DISPOSABLE) ×2 IMPLANT
KIT BASIN OR (CUSTOM PROCEDURE TRAY) ×2 IMPLANT
KIT PORT POWER 8FR ISP CVUE (Port) ×2 IMPLANT
NEEDLE HYPO 22GX1.5 SAFETY (NEEDLE) ×2 IMPLANT
PACK BASIC VI WITH GOWN DISP (CUSTOM PROCEDURE TRAY) ×2 IMPLANT
SUT MNCRL AB 4-0 PS2 18 (SUTURE) ×2 IMPLANT
SUT PROLENE 2 0 SH DA (SUTURE) ×4 IMPLANT
SUT VIC AB 3-0 SH 27 (SUTURE) ×1
SUT VIC AB 3-0 SH 27X BRD (SUTURE) ×1 IMPLANT
SYR 10ML LL (SYRINGE) ×2 IMPLANT
SYR CONTROL 10ML LL (SYRINGE) ×2 IMPLANT
TOWEL OR 17X26 10 PK STRL BLUE (TOWEL DISPOSABLE) ×2 IMPLANT
TOWEL OR NON WOVEN STRL DISP B (DISPOSABLE) ×2 IMPLANT
YANKAUER SUCT BULB TIP 10FT TU (MISCELLANEOUS) IMPLANT

## 2018-04-21 NOTE — Op Note (Signed)
PRE-OPERATIVE DIAGNOSIS: retracted catheter from port  POST-OPERATIVE DIAGNOSIS:  Same  PROCEDURE:  Procedure(s): Port revision  SURGEON:  Surgeon(s): Stark Klein, MD  ANESTHESIA:   local and general  DRAINS: none   LOCAL MEDICATIONS USED:  BUPIVICAINE  and LIDOCAINE   SPECIMEN:  No Specimen  DISPOSITION OF SPECIMEN:  N/A  COUNTS:  YES  DICTATION: .Dragon Dictation  PLAN OF CARE: Discharge to home after PACU  PATIENT DISPOSITION:  PACU - hemodynamically stable.  FINDINGS:  Catheter retracted into brachiocephalic  EBL: 25 mL  PROCEDURE:  Pt was identified in the holding area and taken to the OR where she was placed in supine position.  General anesthesia was induced.  Time out was performed according to the surgical safety checklist.  When all was correct, we continued.    Local anesthesia was administered under the prior incisions.  The port site was opened as well as the prior wire exit site.  The catheter was pulled away from the port.  The catheter was then pulled out the wire exit site from the port.  The wire was advanced through the old catheter.  The wire was checked with fluoro and the patient had ectopy.  The wire was retracted until ectopy stopped.    The old catheter was removed.  It had been 22 cm long and was a bit short.  The new catheter was cut at 28 cm.  It was advanced over the wire and the wire removed.  The catheter was then pulled to the port site with the tunneler.  The new catheter was secured to the port along with the locking cap.  The port was aspirated and flushed.  The catheter was checked with fluoro and was at the svc/ra junction.  There was some twisting at the skin portion which was corrected.    The catheter was then aspirated and flushed with concentrated heparin.  The skin of both sites was closed with 3-0 vicryl deep dermal sutures and 4-0 monocryl running subcuticular sutures.    The skin was cleaned and dried and dressed with dermabond.   The patient was allowed to emerge from anesthesia and taken to the PACU in stable condition.  Needle, sponge, and instrument counts are correct x 2.

## 2018-04-21 NOTE — Anesthesia Postprocedure Evaluation (Signed)
Anesthesia Post Note  Patient: Kelly Rowe  Procedure(s) Performed: PORT-A-CATH REVISION (N/A )     Patient location during evaluation: PACU Anesthesia Type: General Level of consciousness: sedated and patient cooperative Pain management: pain level controlled Vital Signs Assessment: post-procedure vital signs reviewed and stable Respiratory status: spontaneous breathing Cardiovascular status: stable Anesthetic complications: no    Last Vitals:  Vitals:   04/21/18 1033 04/21/18 1045  BP:  118/70  Pulse: 80 85  Resp: 14 14  Temp:    SpO2: 99% 93%    Last Pain:  Vitals:   04/21/18 1045  TempSrc:   PainSc: Asleep                 Nolon Nations

## 2018-04-21 NOTE — Transfer of Care (Signed)
Immediate Anesthesia Transfer of Care Note  Patient: Kelly Rowe  Procedure(s) Performed: PORT-A-CATH REVISION (N/A )  Patient Location: PACU  Anesthesia Type:General  Level of Consciousness: awake and patient cooperative  Airway & Oxygen Therapy: Patient Spontanous Breathing and Patient connected to face mask oxygen  Post-op Assessment: Report given to RN and Post -op Vital signs reviewed and stable  Post vital signs: Reviewed and stable  Last Vitals:  Vitals Value Taken Time  BP 124/82 04/21/2018 10:04 AM  Temp    Pulse 82 04/21/2018 10:06 AM  Resp 0 04/21/2018 10:06 AM  SpO2 94 % 04/21/2018 10:06 AM  Vitals shown include unvalidated device data.  Last Pain:  Vitals:   04/21/18 0734  TempSrc:   PainSc: 0-No pain         Complications: No apparent anesthesia complications

## 2018-04-21 NOTE — Discharge Instructions (Addendum)
[place] Office Phone Number [phone]   POST OP INSTRUCTIONS  Always review your discharge instruction sheet given to you by the facility where your surgery was performed.  IF YOU HAVE DISABILITY OR FAMILY LEAVE FORMS, YOU MUST BRING THEM TO THE OFFICE FOR PROCESSING.  DO NOT GIVE THEM TO YOUR DOCTOR.  1. A prescription for pain medication may be given to you upon discharge.  Take your pain medication as prescribed, if needed.  If narcotic pain medicine is not needed, then you may take acetaminophen (Tylenol) or ibuprofen (Advil) as needed. 2. Take your usually prescribed medications unless otherwise directed 3. If you need a refill on your pain medication, please contact your pharmacy.  They will contact our office to request authorization.  Prescriptions will not be filled after 5pm or on week-ends. 4. You should eat very light the first 24 hours after surgery, such as soup, crackers, pudding, etc.  Resume your normal diet the day after surgery 5. It is common to experience some constipation if taking pain medication after surgery.  Increasing fluid intake and taking a stool softener will usually help or prevent this problem from occurring.  A mild laxative (Milk of Magnesia or Miralax) should be taken according to package directions if there are no bowel movements after 48 hours. 6. You may shower in 48 hours.  The surgical glue will flake off in 2-3 weeks.   7. ACTIVITIES:  No strenuous activity or heavy lifting for 1 week.   a. You may drive when you no longer are taking prescription pain medication, you can comfortably wear a seatbelt, and you can safely maneuver your car and apply brakes. b. RETURN TO WORK:  __________to be determined.  _______________ Dennis Bast should see your doctor in the office for a follow-up appointment approximately three-four weeks after your surgery.    WHEN TO CALL YOUR DOCTOR: 1. Fever over 101.0 2. Nausea and/or vomiting. 3. Extreme swelling  or bruising. 4. Continued bleeding from incision. 5. Increased pain, redness, or drainage from the incision.  The clinic staff is available to answer your questions during regular business hours.  Please dont hesitate to call and ask to speak to one of the nurses for clinical concerns.  If you have a medical emergency, go to the nearest emergency room or call 911.  A surgeon from [facility] is always on call at the hospital.  For further questions, please visit [email or web address]    Implanted Port Insertion, Care After This sheet gives you information about how to care for yourself after your procedure. Your health care provider may also give you more specific instructions. If you have problems or questions, contact your health care provider. What can I expect after the procedure? After your procedure, it is common to have:  Discomfort at the port insertion site.  Bruising on the skin over the port. This should improve over 3-4 days.  Follow these instructions at home: [facility] care  After your port is placed, you will get a manufacturer's information card. The card has information about your port. Keep this card with you at all times.  Take care of the port as told by your health care provider. Ask your health care provider if you or a family member can get training for taking care of the port at home. A home health care nurse may also take care of the port.  Make sure to remember what type of port you have. Incision care  Follow instructions from  your health care provider about how to take care of your port insertion site. Make sure you: ? Wash your hands with soap and water before you change your bandage (dressing). If soap and water are not available, use hand sanitizer. ? Change your dressing as told by your health care provider. ? Leave stitches (sutures), skin glue, or adhesive strips in place. These skin closures may need to stay in place for 2 weeks or  longer. If adhesive strip edges start to loosen and curl up, you may trim the loose edges. Do not remove adhesive strips completely unless your health care provider tells you to do that.  Check your port insertion site every day for signs of infection. Check for: ? More redness, swelling, or pain. ? More fluid or blood. ? Warmth. ? Pus or a bad smell. General instructions  Do not take baths, swim, or use a hot tub until your health care provider approves.  Do not lift anything that is heavier than 10 lb (4.5 kg) for a week, or as told by your health care provider.  Ask your health care provider when it is okay to: ? Return to work or school. ? Resume usual physical activities or sports.  Do not drive for 24 hours if you were given a medicine to help you relax (sedative).  Take over-the-counter and prescription medicines only as told by your health care provider.  Wear a medical alert bracelet in case of an emergency. This will tell any health care providers that you have a port.  Keep all follow-up visits as told by your health care provider. This is important. Contact a health care provider if:  You cannot flush your port with saline as directed, or you cannot draw blood from the port.  You have a fever or chills.  You have more redness, swelling, or pain around your port insertion site.  You have more fluid or blood coming from your port insertion site.  Your port insertion site feels warm to the touch.  You have pus or a bad smell coming from the port insertion site. Get help right away if:  You have chest pain or shortness of breath.  You have bleeding from your port that you cannot control. Summary  Take care of the port as told by your health care provider.  Change your dressing as told by your health care provider.  Keep all follow-up visits as told by your health care provider. This information is not intended to replace advice given to you by your health care  provider. Make sure you discuss any questions you have with your health care provider. Document Released: 05/02/2013 Document Revised: 06/02/2016 Document Reviewed: 06/02/2016 Elsevier Interactive Patient Education  2017 [place] Anesthesia, Adult, Care After These instructions provide you with information about caring for yourself after your procedure. Your health care provider may also give you more specific instructions. Your treatment has been planned according to current medical practices, but problems sometimes occur. Call your health care provider if you have any problems or questions after your procedure. What can I expect after the procedure? After the procedure, it is common to have:  Vomiting.  A sore throat.  Mental slowness.  It is common to feel:  Nauseous.  Cold or shivery.  Sleepy.  Tired.  Sore or achy, even in parts of your body where you did not have surgery.  Follow these instructions at home: For at least 24 hours after the procedure:  Do not: ? Participate in activities where you could fall or become injured. ? Drive. ? Use heavy machinery. ? Drink alcohol. ? Take sleeping pills or medicines that cause drowsiness. ? Make important decisions or sign legal documents. ? Take care of children on your own.  Rest. Eating and drinking  If you vomit, drink water, juice, or soup when you can drink without vomiting.  Drink enough fluid to keep your urine clear or pale yellow.  Make sure you have little or no nausea before eating solid foods.  Follow the diet recommended by your health care provider. General instructions  Have a responsible adult stay with you until you are awake and alert.  Return to your normal activities as told by your health care provider. Ask your health care provider what activities are safe for you.  Take over-the-counter and prescription medicines only as told by your health care provider.  If you smoke, do  not smoke without supervision.  Keep all follow-up visits as told by your health care provider. This is important. Contact a health care provider if:  You continue to have nausea or vomiting at home, and medicines are not helpful.  You cannot drink fluids or start eating again.  You cannot urinate after 8-12 hours.  You develop a skin rash.  You have fever.  You have increasing redness at the site of your procedure. Get help right away if:  You have difficulty breathing.  You have chest pain.  You have unexpected bleeding.  You feel that you are having a life-threatening or urgent problem. This information is not intended to replace advice given to you by your health care provider. Make sure you discuss any questions you have with your health care provider. Document Released: 10/18/2000 Document Revised: 12/15/2015 Document Reviewed: 06/26/2015 Elsevier Interactive Patient Education  [employer].

## 2018-04-21 NOTE — H&P (Signed)
Kelly Rowe is an 47 y.o. female.   Chief Complaint: right breast cancer HPI:  Pt is a 47 yo F with right breast cancer, triple positive with port retraction.  She needs port for chemo.    Past Medical History:  Diagnosis Date  . ANEMIA-IRON DEFICIENCY 01/30/2010  . ANXIETY 11/27/2007  . Arthritis   . Asthma 02/25/2011  . Cancer ([place])    breast  . Carbuncle and furuncle of trunk 04/16/2010  . Chlamydia infection 03/21/2008  . DIABETES MELLITUS, TYPE II 08/02/2007  . Edema 01/30/2010  . ELEVATED BLOOD PRESSURE WITHOUT DIAGNOSIS OF HYPERTENSION 08/02/2007  . Family history of breast cancer   . Family history of colon cancer   . Family history of lung cancer   . FREQUENCY, URINARY 05/19/2009  . GENITAL HERPES 03/12/2009  . GERD (gastroesophageal reflux disease)   . History of kidney stones   . HIV (human immunodeficiency virus infection) ([place]) 2009  . HIV INFECTION 03/12/2009  . HSV (herpes simplex virus) infection   . HYPERLIPIDEMIA 08/02/2007  . Hypertension   . Metrorrhagia 03/21/2008  . PONV (postoperative nausea and vomiting)    woke up crying per patient   . RETENTION, URINE 05/19/2009  . Trichomonas infection 01/19/2010  . VITAMIN D DEFICIENCY 01/30/2010    Past Surgical History:  Procedure Laterality Date  . ABDOMINAL HYSTERECTOMY  07/22/2010   TAH WITH PRESERVATION OF BOTH TUBES AND OVARIES  . BREAST LUMPECTOMY WITH RADIOACTIVE SEED AND SENTINEL LYMPH NODE BIOPSY Right 02/23/2018   Procedure: BREAST LUMPECTOMY WITH RADIOACTIVE SEED AND SENTINEL LYMPH NODE BIOPSY;  Surgeon: Stark Klein, MD;  Location: [facility];  Service: General;  Laterality: Right;  . CHOLECYSTECTOMY    . ENDOMETRIAL ABLATION  01/11/2008   HER OPTION  . ESOPHAGOGASTRODUODENOSCOPY    . MULTIPLE TOOTH EXTRACTIONS    . PORTACATH PLACEMENT Left 02/23/2018   Procedure: INSERTION PORT-A-CATH;  Surgeon: Stark Klein, MD;  Location: [facility];  Service: General;  Laterality: Left;  . TUBAL LIGATION    . WISDOM TOOTH  EXTRACTION      Family History  Problem Relation Age of Onset  . Prostate cancer Other   . Heart disease Other   . Stroke Other   . Diabetes Mother   . Hypertension Mother   . Diabetes Father   . Cancer Father        COLON and LU NG  . Stroke Paternal Uncle   . Breast cancer Paternal Aunt   . Lung cancer Maternal Grandmother 71       Mesothelioma  . Colon cancer Paternal Grandfather        dx over 16s  . Lung cancer Paternal Aunt   . Breast cancer Paternal Aunt   . Breast cancer Paternal Aunt   . Heart attack Maternal Grandfather 71  . Colon cancer Other        MGM's 5 brothers   Social History:  reports that she has never smoked. She has never used smokeless tobacco. She reports that she does not drink alcohol or use drugs.  Allergies:  Allergies  Allergen Reactions  . Levaquin [Levofloxacin In D5w] Nausea And Vomiting and Rash  . Penicillins Swelling and Rash    Has patient had a PCN reaction causing immediate rash, facial/tongue/throat swelling, SOB or lightheadedness with hypotension: Yes Has patient had a PCN reaction causing severe rash involving mucus membranes or skin necrosis: No Has patient had a PCN reaction that required hospitalization: Yes Has patient had  a PCN reaction occurring within the last 10 years: No If all of the above answers are "NO", then may proceed with Cephalosporin use.    Medications Prior to Admission  Medication Sig Dispense Refill  . albuterol (PROVENTIL HFA;VENTOLIN HFA) 108 (90 Base) MCG/ACT inhaler Inhale 2 puffs into the lungs every 6 (six) hours as needed for wheezing or shortness of breath.    Marland Kitchen aspirin 81 MG tablet Take 81 mg by mouth daily as needed (chest pain).     . Azelastine-Fluticasone (DYMISTA) 137-50 MCG/ACT SUSP Use as directed 1 spray each side twice per day as needed 23 g 5  . Beclomethasone Dipropionate (QNASL) 80 MCG/ACT AERS Place 2 sprays into the nose daily. 1 Inhaler 1  . bictegravir-emtricitabine-tenofovir AF  (BIKTARVY) 50-200-25 MG TABS tablet Take 1 tablet by mouth daily.    . Diclofenac Sodium (PENNSAID) 2 % SOLN Place 1 application onto the skin 2 (two) times daily. (Patient taking differently: Place 1 application onto the skin 2 (two) times daily as needed. ) 1 Bottle 3  . Dulaglutide (TRULICITY) 1.5 BM/8.4XL SOPN 0.5 ml weekly SQ 6 mL 3  . famotidine (PEPCID) 20 MG tablet Take 1 tablet (20 mg total) by mouth 2 (two) times daily. (Patient taking differently: Take 20 mg by mouth daily as needed for heartburn. ) 60 tablet 3  . ferrous gluconate (IRON 27) 240 (27 FE) MG tablet Take 240 mg by mouth daily as needed (iron).     Marland Kitchen glipiZIDE (GLIPIZIDE XL) 5 MG 24 hr tablet Take 1 tablet (5 mg total) by mouth daily with breakfast. 90 tablet 3  . ibuprofen (ADVIL,MOTRIN) 800 MG tablet Take 1 tablet (800 mg total) by mouth every 8 (eight) hours as needed. (Patient taking differently: Take 800 mg by mouth daily as needed for moderate pain. ) 60 tablet 0  . lidocaine-prilocaine (EMLA) cream Apply to affected area once (Patient taking differently: Apply 1 application topically once. Apply to affected area once) 30 g 3  . loratadine (CLARITIN) 10 MG tablet Take 10 mg by mouth daily.    Marland Kitchen LORazepam (ATIVAN) 0.5 MG tablet Take 1 tablet (0.5 mg total) by mouth at bedtime as needed (Nausea or vomiting). 30 tablet 0  . metFORMIN (GLUCOPHAGE-XR) 500 MG 24 hr tablet Take 2 tablets (1,000 mg total) by mouth daily with breakfast. Annual appt is due must see provider for future refills 180 tablet 3  . Multiple Vitamin (MULTIVITAMIN WITH MINERALS) TABS tablet Take 1 tablet by mouth daily.    Marland Kitchen omeprazole (PRILOSEC) 40 MG capsule Take 1 capsule (40 mg total) by mouth daily. 30 capsule 0  . ondansetron (ZOFRAN) 8 MG tablet Take 1 tablet (8 mg total) by mouth every 8 (eight) hours as needed for nausea or vomiting. 20 tablet 0  . potassium chloride (K-DUR) 10 MEQ tablet Take 2 tablets (20 mEq total) by mouth 2 (two) times daily.  20 tablet 0  . prochlorperazine (COMPAZINE) 10 MG tablet Take 1 tablet (10 mg total) by mouth every 6 (six) hours as needed (Nausea or vomiting). 30 tablet 1  . valACYclovir (VALTREX) 500 MG tablet Take twice daily for 3-5 days 30 tablet 12  . zolpidem (AMBIEN) 10 MG tablet Take 1 tablet (10 mg total) by mouth at bedtime as needed for sleep. 90 tablet 1  . blood glucose meter kit and supplies Dispense based on patient and insurance preference. Use up to four times daily as directed. (FOR ICD-10 E10.9, E11.9). 1 each  0  . dexamethasone (DECADRON) 4 MG tablet Take 2 tablets (8 mg total) by mouth 2 (two) times daily. Start the day before Taxotere. Take once the day after, then 2 times a day x 2d. (Patient not taking: Reported on 04/19/2018) 30 tablet 1  . hydrocortisone (ANUSOL-HC) 2.5 % rectal cream Place 1 application rectally 2 (two) times daily. (Patient not taking: Reported on 04/21/2018) 30 g 0  . oxyCODONE (OXY IR/ROXICODONE) 5 MG immediate release tablet Take 1-2 tablets (5-10 mg total) by mouth every 6 (six) hours as needed for moderate pain, severe pain or breakthrough pain. (Patient not taking: Reported on 04/21/2018) 30 tablet 0    Results for orders placed or performed during the hospital encounter of 04/21/18 (from the past 48 hour(s))  Glucose, capillary     Status: Abnormal   Collection Time: 04/21/18  7:09 AM  Result Value Ref Range   Glucose-Capillary 181 (H) 70 - 99 mg/dL   Comment 1 Notify RN    Comment 2 Document in Chart    No results found.  Review of Systems  All other systems reviewed and are negative.   Blood pressure 110/65, pulse 86, temperature 98.9 F (37.2 C), temperature source Oral, resp. rate 18, height 5' 2"  (1.575 m), weight 112.7 kg, last menstrual period 06/21/2010, SpO2 98 %. Physical Exam  Constitutional: She is oriented to person, place, and time. She appears well-developed and well-nourished. No distress.  HENT:  Head: Normocephalic.  Cardiovascular:  Normal rate.  Respiratory: Effort normal.  GI: Soft.  Musculoskeletal: Normal range of motion.  Neurological: She is alert and oriented to person, place, and time.  Skin: Skin is warm.  Psychiatric: She has a normal mood and affect. Her behavior is normal. Judgment and thought content normal.     Assessment/Plan Port retraction Plan port revision. Discussed risks  Stark Klein, MD 04/21/2018, 8:29 AM

## 2018-04-21 NOTE — Anesthesia Procedure Notes (Signed)
Procedure Name: LMA Insertion Date/Time: 04/21/2018 8:50 AM Performed by: Dione Booze, CRNA Pre-anesthesia Checklist: Emergency Drugs available, Suction available, Patient being monitored and Patient identified Patient Re-evaluated:Patient Re-evaluated prior to induction Oxygen Delivery Method: Circle system utilized Preoxygenation: Pre-oxygenation with 100% oxygen Induction Type: IV induction LMA Size: 4.0 Number of attempts: 1 Placement Confirmation: positive ETCO2 and breath sounds checked- equal and bilateral Tube secured with: Tape Dental Injury: Teeth and Oropharynx as per pre-operative assessment

## 2018-04-24 ENCOUNTER — Encounter (HOSPITAL_COMMUNITY): Payer: Self-pay | Admitting: General Surgery

## 2018-04-25 NOTE — Progress Notes (Signed)
[place]  [phone] Fax:(336) [phone]     ID: [name] DOB: July 23, 1971  MR#: [id number]  PYK#:[id number]  Patient Care Team: Biagio Borg, MD as PCP - Kelly Citizen, MD as Consulting Physician (General Surgery) Stace Peace, [name], MD as Consulting Physician (Oncology) [name], MD as Attending Physician (Radiation Oncology) [name], NP as Nurse Practitioner (Obstetrics and Gynecology) Marlinda Mike, PA-C as Referring Physician (Physician Assistant) OTHER MD:   CHIEF COMPLAINT: Triple positive breast cancer  CURRENT TREATMENT: adjuvant chemotherapy   HISTORY OF CURRENT ILLNESS: From the original intake note:  "Kelly Rowe" noticed bruising in her lateral right breast and palpated a mass  on 12/23/2017. She followed up with her gynecologist. She underwent unilateral right diagnostic mammography with tomography and right breast ultrasonography at [facility] on 01/05/2018 showing: breast density category B. There is an irregular highly suspicious mass within the right breast at the 9:30 o'clock upper outer quadrant, measuring 1.8 x 1.1 x 1.5 cm, and located 18 cm from the nipple,  Ultrasonography revealed a single morphologically abnormal lymph node in the RIGHT axilla, with cortical thickness of 6 mm.   Accordingly on 01/11/2018 she proceeded to biopsy of the right breast area in question as well as a suspicious lymph node. The pathology from this procedure showed ([id number]): Invasive ductal carcinoma grade III.  The lymph node biopsied was negative for carcinoma (concordant).. Prognostic indicators significant for: estrogen receptor, 60% positive with weak staining intensity and progesterone receptor, 10% positive with strong staining intensity. Proliferation marker Ki67 at 30%. HER2 amplified with ratios HER2/CEP17 signals 2.26 and average HER2 copies per cell 3.50  The patient's subsequent history is as detailed  below.  INTERVAL HISTORY: Kelly Rowe returns today for follow up and treatment of her triple positive breast cancer. She is receiving neoadjuvant chemotherapy with Gemcitabine (previously Docetaxel), Carboplatin, and Trastuzumab,  given on day 1 of every 21 day cycle. She receives [place] support on day 3. Today is day 8 cycle 3.   She tolerated the gemcitabine considerably better than the docetaxel with pertuzumab. She continues to have loose bowel movements every day, but they are not watery.  Recall we stopped the Perjeta for this reason.  She tries to eat rice and yogurt and takes Imodium twice per week. Both of her hands are dry and pealing.    REVIEW OF SYSTEMS: Kelly Rowe reports that she had a port revision on 04/21/2018. She developed a cold over the weekend from being around her sister who was sick with a respiratory injection.  She denies unusual headaches, visual changes, nausea, vomiting, or dizziness. There has been no unusual cough, phlegm production, or pleurisy. There has been no change in bowel or bladder habits. She denies unexplained fatigue or unexplained weight loss, bleeding, rash, or fever. A detailed review of systems was otherwise stable.    PAST MEDICAL HISTORY: Past Medical History:  Diagnosis Date  . ANEMIA-IRON DEFICIENCY 01/30/2010  . ANXIETY 11/27/2007  . Arthritis   . Asthma 02/25/2011  . Cancer ([facility])    breast  . Carbuncle and furuncle of trunk 04/16/2010  . Chlamydia infection 03/21/2008  . DIABETES MELLITUS, TYPE II 08/02/2007  . Edema 01/30/2010  . ELEVATED BLOOD PRESSURE WITHOUT DIAGNOSIS OF HYPERTENSION 08/02/2007  . Family history of breast cancer   . Family history of colon cancer   . Family history of lung cancer   . FREQUENCY, URINARY 05/19/2009  . GENITAL HERPES 03/12/2009  . GERD (gastroesophageal  reflux disease)   . History of kidney stones   . HIV (human immunodeficiency virus infection) ([place]) 2009  . HIV INFECTION 03/12/2009  . HSV (herpes simplex  virus) infection   . HYPERLIPIDEMIA 08/02/2007  . Hypertension   . Metrorrhagia 03/21/2008  . PONV (postoperative nausea and vomiting)    woke up crying per patient   . RETENTION, URINE 05/19/2009  . Trichomonas infection 01/19/2010  . VITAMIN D DEFICIENCY 01/30/2010    PAST SURGICAL HISTORY: Past Surgical History:  Procedure Laterality Date  . ABDOMINAL HYSTERECTOMY  07/22/2010   TAH WITH PRESERVATION OF BOTH TUBES AND OVARIES  . BREAST LUMPECTOMY WITH RADIOACTIVE SEED AND SENTINEL LYMPH NODE BIOPSY Right 02/23/2018   Procedure: BREAST LUMPECTOMY WITH RADIOACTIVE SEED AND SENTINEL LYMPH NODE BIOPSY;  Surgeon: Stark Klein, MD;  Location: [facility];  Service: General;  Laterality: Right;  . CHOLECYSTECTOMY    . ENDOMETRIAL ABLATION  01/11/2008   HER OPTION  . ESOPHAGOGASTRODUODENOSCOPY    . MULTIPLE TOOTH EXTRACTIONS    . PORTACATH PLACEMENT Left 02/23/2018   Procedure: INSERTION PORT-A-CATH;  Surgeon: Stark Klein, MD;  Location: [facility];  Service: General;  Laterality: Left;  . PORTACATH PLACEMENT N/A 04/21/2018   Procedure: PORT-A-CATH REVISION;  Surgeon: Stark Klein, MD;  Location: [facility];  Service: General;  Laterality: N/A;  . TUBAL LIGATION    . WISDOM TOOTH EXTRACTION      FAMILY HISTORY Family History  Problem Relation Age of Onset  . Prostate cancer Other   . Heart disease Other   . Stroke Other   . Diabetes Mother   . Hypertension Mother   . Diabetes Father   . Cancer Father        COLON and LU NG  . Stroke Paternal Uncle   . Breast cancer Paternal Aunt   . Lung cancer Maternal Grandmother 22       Mesothelioma  . Colon cancer Paternal Grandfather        dx over 107s  . Lung cancer Paternal Aunt   . Breast cancer Paternal Aunt   . Breast cancer Paternal Aunt   . Heart attack Maternal Grandfather 71  . Colon cancer Other        MGM's 5 brothers  The patient's father died at age 61 due to metastatic liver cancer. The patient's mother is alive at 52. The patient's has  1 brother and 3 sisters. There was a paternal grandfather with colon cancer. There were 3 paternal aunts with breast cancer, 2 diagnosed in the 57's and 1 at age 68. There was a maternal grandmother with mesothelioma. The patient otherwise denies a history of ovarian cancer in the family.    GYNECOLOGIC HISTORY:  Patient's last menstrual period was 06/21/2010. Menarche: 47 years old Age at first live birth: 47 years old She is GXP5. Her LMP was December 2011. She is status post partial hysterectomy without oophorectomy. She took oral contraception for 3 years with no complications. She never took HRT.    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  At home are 2 of her sons, Glendell Docker and [name], and the patient's grandson, Amador Cunas 36 (who is Willie's son). The patient's oldest is [place] age 73 who lives in [place] as a cook. Daughter, [place] age 66 works as a Scientist, water quality. Son, Glendell Docker age 85 is disabled. Son, [name] age 53 lives in [place] as a Microbiologist. Daughter, Benard Rink age 10 also is a Microbiologist. The patient has 5 grandchildren and no  great grandchildren. The patient does not belong to a church.        ADVANCED DIRECTIVES: Not in place; at the 01/18/2018 visit the patient was given the appropriate documents to complete on notarized at her discretion   HEALTH MAINTENANCE: Social History   Tobacco Use  . Smoking status: Never Smoker  . Smokeless tobacco: Never Used  Substance Use Topics  . Alcohol use: Never    Alcohol/week: 0.0 standard drinks    Frequency: Never  . Drug use: No     Colonoscopy: Not yet  PAP: November 2018  Bone density: Never   Allergies  Allergen Reactions  . Levaquin [Levofloxacin In D5w] Nausea And Vomiting and Rash  . Penicillins Swelling and Rash    Has patient had a PCN reaction causing immediate rash, facial/tongue/throat swelling, SOB or lightheadedness with hypotension: Yes Has patient had a PCN reaction causing severe rash involving mucus  membranes or skin necrosis: No Has patient had a PCN reaction that required hospitalization: Yes Has patient had a PCN reaction occurring within the last 10 years: No If all of the above answers are "NO", then may proceed with Cephalosporin use.    Current Outpatient Medications  Medication Sig Dispense Refill  . albuterol (PROVENTIL HFA;VENTOLIN HFA) 108 (90 Base) MCG/ACT inhaler Inhale 2 puffs into the lungs every 6 (six) hours as needed for wheezing or shortness of breath.    Marland Kitchen aspirin 81 MG tablet Take 81 mg by mouth daily as needed (chest pain).     . Azelastine-Fluticasone (DYMISTA) 137-50 MCG/ACT SUSP Use as directed 1 spray each side twice per day as needed 23 g 5  . Beclomethasone Dipropionate (QNASL) 80 MCG/ACT AERS Place 2 sprays into the nose daily. 1 Inhaler 1  . bictegravir-emtricitabine-tenofovir AF (BIKTARVY) 50-200-25 MG TABS tablet Take 1 tablet by mouth daily.    . blood glucose meter kit and supplies Dispense based on patient and insurance preference. Use up to four times daily as directed. (FOR ICD-10 E10.9, E11.9). 1 each 0  . dexamethasone (DECADRON) 4 MG tablet Take 2 tablets (8 mg total) by mouth 2 (two) times daily. Start the day before Taxotere. Take once the day after, then 2 times a day x 2d. (Patient not taking: Reported on 04/19/2018) 30 tablet 1  . Diclofenac Sodium (PENNSAID) 2 % SOLN Place 1 application onto the skin 2 (two) times daily. (Patient taking differently: Place 1 application onto the skin 2 (two) times daily as needed. ) 1 Bottle 3  . Dulaglutide (TRULICITY) 1.5 WK/4.6KM SOPN 0.5 ml weekly SQ 6 mL 3  . famotidine (PEPCID) 20 MG tablet Take 1 tablet (20 mg total) by mouth 2 (two) times daily. (Patient taking differently: Take 20 mg by mouth daily as needed for heartburn. ) 60 tablet 3  . ferrous gluconate (IRON 27) 240 (27 FE) MG tablet Take 240 mg by mouth daily as needed (iron).     Marland Kitchen glipiZIDE (GLIPIZIDE XL) 5 MG 24 hr tablet Take 1 tablet (5 mg total)  by mouth daily with breakfast. 90 tablet 3  . hydrocortisone (ANUSOL-HC) 2.5 % rectal cream Place 1 application rectally 2 (two) times daily. (Patient not taking: Reported on 04/21/2018) 30 g 0  . ibuprofen (ADVIL,MOTRIN) 800 MG tablet Take 1 tablet (800 mg total) by mouth every 8 (eight) hours as needed. (Patient taking differently: Take 800 mg by mouth daily as needed for moderate pain. ) 60 tablet 0  . lidocaine-prilocaine (EMLA) cream Apply to affected  area once (Patient taking differently: Apply 1 application topically once. Apply to affected area once) 30 g 3  . loratadine (CLARITIN) 10 MG tablet Take 10 mg by mouth daily.    Marland Kitchen LORazepam (ATIVAN) 0.5 MG tablet Take 1 tablet (0.5 mg total) by mouth at bedtime as needed (Nausea or vomiting). 30 tablet 0  . metFORMIN (GLUCOPHAGE-XR) 500 MG 24 hr tablet Take 2 tablets (1,000 mg total) by mouth daily with breakfast. Annual appt is due must see provider for future refills 180 tablet 3  . Multiple Vitamin (MULTIVITAMIN WITH MINERALS) TABS tablet Take 1 tablet by mouth daily.    Marland Kitchen omeprazole (PRILOSEC) 40 MG capsule Take 1 capsule (40 mg total) by mouth daily. 30 capsule 0  . ondansetron (ZOFRAN) 8 MG tablet Take 1 tablet (8 mg total) by mouth every 8 (eight) hours as needed for nausea or vomiting. 20 tablet 0  . oxyCODONE (OXY IR/ROXICODONE) 5 MG immediate release tablet Take 1-2 tablets (5-10 mg total) by mouth every 6 (six) hours as needed for moderate pain, severe pain or breakthrough pain. 30 tablet 0  . potassium chloride (K-DUR) 10 MEQ tablet Take 2 tablets (20 mEq total) by mouth 2 (two) times daily. 20 tablet 0  . prochlorperazine (COMPAZINE) 10 MG tablet Take 1 tablet (10 mg total) by mouth every 6 (six) hours as needed (Nausea or vomiting). 30 tablet 1  . valACYclovir (VALTREX) 500 MG tablet Take twice daily for 3-5 days 30 tablet 12  . zolpidem (AMBIEN) 10 MG tablet Take 1 tablet (10 mg total) by mouth at bedtime as needed for sleep. 90 tablet  1   No current facility-administered medications for this visit.     OBJECTIVE: Morbidly obese African-American woman in no acute distress  Vitals:   04/26/18 0949  BP: 127/62  Pulse: (!) 102  Resp: 18  Temp: 98.6 F (37 C)  SpO2: 100%     Body mass index is 44.83 kg/m.   Wt Readings from Last 3 Encounters:  04/26/18 245 lb 1.6 oz (111.2 kg)  04/21/18 248 lb 6 oz (112.7 kg)  04/19/18 248 lb (112.5 kg)   ECOG FS: 1 - Symptomatic but completely ambulatory  Sclerae unicteric, EOMs intact No cervical or supraclavicular adenopathy Lungs no rales or rhonchi Heart regular rate and rhythm Abd soft, obese, nontender, positive bowel sounds MSK no focal spinal tenderness, no upper extremity lymphedema Neuro: nonfocal, well oriented, positive affect Breasts: The right breast is status post lumpectomy.  The axillary incision is continuing to complete healing by secondary intention.  There is no significant swelling or erythema.  The left breast is benign.  Left axilla is benign.   LAB RESULTS:  CMP     Component Value Date/Time   NA 138 04/26/2018 0901   K 2.6 (LL) 04/26/2018 0901   CL 96 (L) 04/26/2018 0901   CO2 29 04/26/2018 0901   GLUCOSE 236 (H) 04/26/2018 0901   BUN 6 04/26/2018 0901   CREATININE 0.88 04/26/2018 0901   CALCIUM 8.8 (L) 04/26/2018 0901   PROT 7.0 04/26/2018 0901   ALBUMIN 3.4 (L) 04/26/2018 0901   AST 39 04/26/2018 0901   ALT 45 (H) 04/26/2018 0901   ALKPHOS 292 (H) 04/26/2018 0901   BILITOT 0.3 04/26/2018 0901   GFRNONAA >60 04/26/2018 0901   GFRAA >60 04/26/2018 0901    No results found for: TOTALPROTELP, ALBUMINELP, A1GS, A2GS, BETS, BETA2SER, GAMS, MSPIKE, SPEI  No results found for: KPAFRELGTCHN, LAMBDASER, KAPLAMBRATIO  Lab Results  Component Value Date   WBC 17.0 (H) 04/26/2018   NEUTROABS 13.4 (H) 04/26/2018   HGB 10.7 (L) 04/26/2018   HCT 32.1 (L) 04/26/2018   MCV 85.3 04/26/2018   PLT 220 04/26/2018    @LASTCHEMISTRY @  No  results found for: LABCA2  No components found for: FIEPPI951  No results for input(s): INR in the last 168 hours.  No results found for: LABCA2  No results found for: OAC166  No results found for: AYT016  No results found for: WFU932  No results found for: CA2729  No components found for: HGQUANT  No results found for: CEA1 / No results found for: CEA1   No results found for: AFPTUMOR  No results found for: [place]  No results found for: PSA1  Appointment on 04/26/2018  Component Date Value Ref Range Status  . WBC Count 04/26/2018 17.0* 3.9 - 10.3 K/uL Final  . RBC 04/26/2018 3.76  3.70 - 5.45 MIL/uL Final  . Hemoglobin 04/26/2018 10.7* 11.6 - 15.9 g/dL Final  . HCT 04/26/2018 32.1* 34.8 - 46.6 % Final  . MCV 04/26/2018 85.3  79.5 - 101.0 fL Final  . MCH 04/26/2018 28.3  25.1 - 34.0 pg Final  . MCHC 04/26/2018 33.2  31.5 - 36.0 g/dL Final  . RDW 04/26/2018 16.0* 11.2 - 14.5 % Final  . Platelet Count 04/26/2018 220  145 - 400 K/uL Final  . Neutrophils Relative % 04/26/2018 79  % Final  . Neutro Abs 04/26/2018 13.4* 1.5 - 6.5 K/uL Final  . Lymphocytes Relative 04/26/2018 14  % Final  . Lymphs Abs 04/26/2018 2.4  0.9 - 3.3 K/uL Final  . Monocytes Relative 04/26/2018 7  % Final  . Monocytes Absolute 04/26/2018 1.2* 0.1 - 0.9 K/uL Final  . Eosinophils Relative 04/26/2018 0  % Final  . Eosinophils Absolute 04/26/2018 0.0  0.0 - 0.5 K/uL Final  . Basophils Relative 04/26/2018 0  % Final  . Basophils Absolute 04/26/2018 0.0  0.0 - 0.1 K/uL Final   Performed at [facility], [place] [address] [place], [place] [address]  . Sodium 04/26/2018 138  135 - 145 mmol/L Final  . Potassium 04/26/2018 2.6* 3.5 - 5.1 mmol/L Final   CRITICAL RESULT CALLED TO, READ BACK BY AND VERIFIED WITH: Thompson Caul at [phone]   . Chloride 04/26/2018 96* 98 - 111 mmol/L Final  . CO2 04/26/2018 29  22 - 32 mmol/L Final  . Glucose, Bld 04/26/2018 236* 70 - 99 mg/dL Final  .  BUN 04/26/2018 6  6 - 20 mg/dL Final  . Creatinine 04/26/2018 0.88  0.44 - 1.00 mg/dL Final  . Calcium 04/26/2018 8.8* 8.9 - 10.3 mg/dL Final  . Total Protein 04/26/2018 7.0  6.5 - 8.1 g/dL Final  . Albumin 04/26/2018 3.4* 3.5 - 5.0 g/dL Final  . AST 04/26/2018 39  15 - 41 U/L Final  . ALT 04/26/2018 45* 0 - 44 U/L Final  . Alkaline Phosphatase 04/26/2018 292* 38 - 126 U/L Final  . Total Bilirubin 04/26/2018 0.3  0.3 - 1.2 mg/dL Final  . GFR, Est Non Af Am 04/26/2018 >60  >60 mL/min Final  . GFR, Est AFR Am 04/26/2018 >60  >60 mL/min Final   Comment: (NOTE) The eGFR has been calculated using the CKD EPI equation. This calculation has not been validated in all clinical situations. eGFR's persistently <60 mL/min signify possible Chronic Kidney Disease.   . Anion gap 04/26/2018 13  5 -  15 Final   Performed at [facility], [place] [address] [place], [place] [address]    (this displays the last labs from the last 3 days)  No results found for: TOTALPROTELP, ALBUMINELP, A1GS, A2GS, BETS, BETA2SER, GAMS, MSPIKE, SPEI (this displays SPEP labs)  No results found for: KPAFRELGTCHN, LAMBDASER, KAPLAMBRATIO (kappa/lambda light chains)  No results found for: HGBA, HGBA2QUANT, HGBFQUANT, HGBSQUAN (Hemoglobinopathy evaluation)   No results found for: LDH  Lab Results  Component Value Date   IRON 35 (L) 01/30/2010   IRONPCTSAT 8.8 (L) 01/30/2010   (Iron and TIBC)  No results found for: FERRITIN  Urinalysis    Component Value Date/Time   COLORURINE RED (A) 04/01/2018 0202   APPEARANCEUR HAZY (A) 04/01/2018 0202   LABSPEC 1.021 04/01/2018 0202   PHURINE 6.0 04/01/2018 0202   GLUCOSEU >=500 (A) 04/01/2018 0202   GLUCOSEU NEGATIVE 09/27/2017 1002   HGBUR LARGE (A) 04/01/2018 0202   BILIRUBINUR NEGATIVE 04/01/2018 0202   KETONESUR 5 (A) 04/01/2018 0202   PROTEINUR 100 (A) 04/01/2018 0202   UROBILINOGEN 0.2 09/27/2017 1002   NITRITE NEGATIVE 04/01/2018 0202    LEUKOCYTESUR NEGATIVE 04/01/2018 0202     STUDIES: Dg Chest 2 View  Result Date: 04/07/2018 CLINICAL DATA:  Check Port-A-Cath placement. History of breast cancer. EXAM: CHEST - 2 VIEW COMPARISON:  02/23/2018 FINDINGS: Left subclavian approach injectable port terminates at the expected location of the junction of brachiocephalic vein and superior vena cava. Mild kinking of the catheter is seen at the level of the second rib laterally. Cardiomediastinal silhouette is normal. Mediastinal contours appear intact. There is no evidence of focal airspace consolidation, pleural effusion or pneumothorax. Osseous structures are without acute abnormality. Postsurgical changes in the right chest wall. IMPRESSION: Left subclavian approach injectable port terminates at the expected location of the junction of the brachiocephalic vein and superior vena cava. Mild kinking of the catheter seen at the level of the lateral second rib. No pneumothorax. Electronically Signed   By: Fidela Salisbury M.D.   On: 04/07/2018 11:15   Ct Abdomen Pelvis W Contrast  Result Date: 04/01/2018 CLINICAL DATA:  Abdominal cramping. Hematuria. Breast cancer patient with last chemotherapy on Wednesday. EXAM: CT ABDOMEN AND PELVIS WITH CONTRAST TECHNIQUE: Multidetector CT imaging of the abdomen and pelvis was performed using the standard protocol following bolus administration of intravenous contrast. CONTRAST:  145m ISOVUE-300 IOPAMIDOL (ISOVUE-300) INJECTION 61% COMPARISON:  None. FINDINGS: Lower chest: Mild dependent changes in the lung bases. Hepatobiliary: Mild diffuse fatty infiltration of the liver. No focal liver abnormality is seen. Status post cholecystectomy. No biliary dilatation. Pancreas: Unremarkable. No pancreatic ductal dilatation or surrounding inflammatory changes. Spleen: Normal in size without focal abnormality. Adrenals/Urinary Tract: No adrenal gland nodules. Subcentimeter cyst in the left kidney. Kidneys are  symmetrical in size. There is mild right hydronephrosis and hydroureter. No obstructing ureteral stone is identified. This could indicate a non radiopaque stone, stricture, reflux or pyelonephritis. 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 The lower pole moiety in a duplicated system is susceptible to reflux. The bladder is unremarkable. Stomach/Bowel: Stomach is within normal limits. Appendix appears normal. No evidence of bowel wall thickening, distention, or inflammatory changes. Vascular/Lymphatic: No significant vascular findings are present. No enlarged abdominal or pelvic lymph nodes. Reproductive: Status post hysterectomy. No adnexal masses. Other: No abdominal wall hernia or abnormality. No abdominopelvic ascites. Musculoskeletal: No acute or significant osseous findings. IMPRESSION: 1. 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 No obstructing ureteral stone is identified. This could indicate a non radiopaque stone, stricture, reflux or pyelonephritis. The lower pole moiety in a duplicated system is susceptible to reflux. 2. Mild diffuse fatty infiltration of the liver. 3. Status post cholecystectomy and hysterectomy. Electronically Signed   By: Lucienne Capers M.D.   On: 04/01/2018 06:34   Dg Chest Port 1 View  Result Date: 04/21/2018 CLINICAL DATA:  Status post Port-A-Cath placement. EXAM: PORTABLE CHEST 1 VIEW COMPARISON:  04/07/2018 FINDINGS: New left anterior chest wall Port-A-Cath extends through the subclavian vein. Tip projects in the right atrium. No pneumothorax.  No acute findings in the lungs. Cardiac silhouette is normal in size. No mediastinal or hilar masses. Stable changes from prior right breast surgery. IMPRESSION: 1. Left anterior chest wall Port-A-Cath. Tip projects in the right atrium. 2. No pneumothorax.  No acute cardiopulmonary disease. Electronically Signed    By: Lajean Manes M.D.   On: 04/21/2018 11:17   Dg C-arm 1-60 Min-no Report  Result Date: 04/21/2018 Fluoroscopy was utilized by the requesting physician.  No radiographic interpretation.    ELIGIBLE FOR AVAILABLE RESEARCH PROTOCOL: no  ASSESSMENT: 47 y.o. [place], [name] woman status post right breast upper outer quadrant biopsy 01/11/2018 for a clinical T1 N0, stage IA invasive ductal carcinoma, grade 3, estrogen and progesterone receptor positive, HER-2 amplified, with an MIB-1 of 30%.  (1) status post right lumpectomy and sentinel lymph node sampling 02/23/2018 for a pT2 pN1, stage IB invasive ductal carcinoma, grade 3, with close but negative margins  (2) adjuvant chemotherapy consisting of carboplatin, docetaxel, trastuzumab and Pertuzumab every 21 days x 6 started 03/08/2018  (a) pertuzumab stopped after cycle 1 due to diarrhea  (b) docetaxel changed to gemcitabine after cycle 2 due to persistent hyperglycemia  (3) anti-HER-2 immunotherapy to start concurrently with chemotherapy  (a) baseline echocardiogram on 02/07/2018 showed an ejection fraction in the 55-60% range  (4) adjuvant radiation to follow  (5) antiestrogens to follow at the completion of local treatment  (6) genetics testing through [facility] Multi-cancer and Breast panel on 01/13/2018 showed: no deleterious mutations. The following genes were evaluated for sequence changes and exonic deletions/duplications: ALK, APC, ATM, AXIN2, BAP1, BARD1, BLM, BMPR1A, BRCA1, BRCA2, BRIP1, CASR, CDC73, CDH1, CDK4, CDKN1B, CDKN1C,CDKN2A (p14ARF), CDKN2A (p16INK4a), CEBPA, CHEK2, CTNNA1, DICER1, DIS3L2, EPCAM*, FH, FLCN, GATA2, GPC3, GREM1*, HRAS, KIT, MAX, MEN1, MET, MLH1, MSH2, MSH3, MSH6, MUTYH, NBN, NF1, NF2, PALB2, PDGFRA, PHOX2B*, PMS2, POLD1,POLE, POT1, PRKAR1A, PTCH1, PTEN, RAD50, RAD51C, RAD51D, RB1, RECQL4, RET, RUNX1, SDHAF2, SDHB, SDHC, SDHD,SMAD4, SMARCA4, SMARCB1, SMARCE1, STK11, SUFU, TERC, TERT, TMEM127, TP53, TSC1, TSC2,  VHL, WRN*, WT1.The following genes were evaluated for sequence changes only: EGFR*, HOXB13*, MITF*, NTHL1*, SDHA  PLAN: Journiee tolerated cycle 3 much better and at this point I feel comfortable she will be able to complete her 6 adjuvant chemotherapy treatments.  I have reviewed the dates with her and we are moving the final cycle 1 week because she does not want to be treated the day before Thanksgiving's.  Once she completes her treatment, the first week in December, she will be ready to start radiation.  I am requesting an appointment with Dr. Isidore Moos sometime in mid December to begin to set that up.  I have encouraged her to exercise as much as possible.  This would be walking.  If she can only do 5 or 10 minutes at a time doing that 2 or  3 times a day would be optimal  We are going to see her on day 8 or so after each cycle.  She knows to call for any other issues that may develop before those visits.  Satori Krabill, [name], MD  04/26/18 9:56 AM Medical Oncology and Hematology [facility] [address] [place], [place] [address] Tel. [phone]    Fax. [phone]  Alice Rieger, am acting as scribe for Chauncey Cruel MD.  I, Lurline Del MD, have reviewed the above documentation for accuracy and completeness, and I agree with the above.

## 2018-04-26 ENCOUNTER — Telehealth: Payer: Self-pay | Admitting: Oncology

## 2018-04-26 ENCOUNTER — Other Ambulatory Visit: Payer: Self-pay | Admitting: Adult Health

## 2018-04-26 ENCOUNTER — Inpatient Hospital Stay: Payer: BC Managed Care – PPO

## 2018-04-26 ENCOUNTER — Inpatient Hospital Stay: Payer: BC Managed Care – PPO | Attending: Oncology | Admitting: Oncology

## 2018-04-26 ENCOUNTER — Telehealth: Payer: Self-pay

## 2018-04-26 VITALS — BP 127/62 | HR 102 | Temp 98.6°F | Resp 18 | Ht 62.0 in | Wt 245.1 lb

## 2018-04-26 DIAGNOSIS — E876 Hypokalemia: Secondary | ICD-10-CM

## 2018-04-26 DIAGNOSIS — B20 Human immunodeficiency virus [HIV] disease: Secondary | ICD-10-CM | POA: Diagnosis not present

## 2018-04-26 DIAGNOSIS — C50411 Malignant neoplasm of upper-outer quadrant of right female breast: Secondary | ICD-10-CM | POA: Insufficient documentation

## 2018-04-26 DIAGNOSIS — Z5112 Encounter for antineoplastic immunotherapy: Secondary | ICD-10-CM | POA: Insufficient documentation

## 2018-04-26 DIAGNOSIS — Z79899 Other long term (current) drug therapy: Secondary | ICD-10-CM | POA: Diagnosis not present

## 2018-04-26 DIAGNOSIS — Z170 Estrogen receptor positive status [ER+]: Secondary | ICD-10-CM

## 2018-04-26 DIAGNOSIS — F419 Anxiety disorder, unspecified: Secondary | ICD-10-CM | POA: Insufficient documentation

## 2018-04-26 DIAGNOSIS — R197 Diarrhea, unspecified: Secondary | ICD-10-CM | POA: Diagnosis not present

## 2018-04-26 DIAGNOSIS — I10 Essential (primary) hypertension: Secondary | ICD-10-CM | POA: Diagnosis not present

## 2018-04-26 DIAGNOSIS — E119 Type 2 diabetes mellitus without complications: Secondary | ICD-10-CM | POA: Insufficient documentation

## 2018-04-26 DIAGNOSIS — Z5189 Encounter for other specified aftercare: Secondary | ICD-10-CM | POA: Diagnosis not present

## 2018-04-26 LAB — CBC WITH DIFFERENTIAL (CANCER CENTER ONLY)
Basophils Absolute: 0 10*3/uL (ref 0.0–0.1)
Basophils Relative: 0 %
Eosinophils Absolute: 0 10*3/uL (ref 0.0–0.5)
Eosinophils Relative: 0 %
HCT: 32.1 % — ABNORMAL LOW (ref 34.8–46.6)
Hemoglobin: 10.7 g/dL — ABNORMAL LOW (ref 11.6–15.9)
Lymphocytes Relative: 14 %
Lymphs Abs: 2.4 10*3/uL (ref 0.9–3.3)
MCH: 28.3 pg (ref 25.1–34.0)
MCHC: 33.2 g/dL (ref 31.5–36.0)
MCV: 85.3 fL (ref 79.5–101.0)
Monocytes Absolute: 1.2 10*3/uL — ABNORMAL HIGH (ref 0.1–0.9)
Monocytes Relative: 7 %
Neutro Abs: 13.4 10*3/uL — ABNORMAL HIGH (ref 1.5–6.5)
Neutrophils Relative %: 79 %
Platelet Count: 220 10*3/uL (ref 145–400)
RBC: 3.76 MIL/uL (ref 3.70–5.45)
RDW: 16 % — ABNORMAL HIGH (ref 11.2–14.5)
WBC Count: 17 10*3/uL — ABNORMAL HIGH (ref 3.9–10.3)

## 2018-04-26 LAB — CMP (CANCER CENTER ONLY)
ALT: 45 U/L — ABNORMAL HIGH (ref 0–44)
AST: 39 U/L (ref 15–41)
Albumin: 3.4 g/dL — ABNORMAL LOW (ref 3.5–5.0)
Alkaline Phosphatase: 292 U/L — ABNORMAL HIGH (ref 38–126)
Anion gap: 13 (ref 5–15)
BUN: 6 mg/dL (ref 6–20)
CO2: 29 mmol/L (ref 22–32)
Calcium: 8.8 mg/dL — ABNORMAL LOW (ref 8.9–10.3)
Chloride: 96 mmol/L — ABNORMAL LOW (ref 98–111)
Creatinine: 0.88 mg/dL (ref 0.44–1.00)
GFR, Est AFR Am: >60 mL/min (ref >60)
GFR, Estimated: >60 mL/min (ref >60)
Glucose, Bld: 236 mg/dL — ABNORMAL HIGH (ref 70–99)
Potassium: 2.6 mmol/L — CL (ref 3.5–5.1)
Sodium: 138 mmol/L (ref 135–145)
Total Bilirubin: 0.3 mg/dL (ref 0.3–1.2)
Total Protein: 7 g/dL (ref 6.5–8.1)

## 2018-04-26 MED ORDER — POTASSIUM CHLORIDE ER 10 MEQ PO TBCR: 40.0000 meq | EXTENDED_RELEASE_TABLET | Freq: Two times a day (BID) | ORAL | 0 refills | Status: DC

## 2018-04-26 MED ORDER — POTASSIUM CHLORIDE ER 10 MEQ PO TBCR: 10.0000 meq | EXTENDED_RELEASE_TABLET | Freq: Every day | ORAL | 0 refills | Status: DC

## 2018-04-26 MED ORDER — SODIUM CHLORIDE 0.9 % IV SOLN
Freq: Once | INTRAVENOUS | Status: DC
  Filled 2018-04-26: qty 1000

## 2018-04-26 MED ORDER — SODIUM CHLORIDE 0.9 % IV SOLN
Freq: Once | INTRAVENOUS | Status: DC
  Filled 2018-04-26: qty 250

## 2018-04-26 NOTE — Telephone Encounter (Signed)
LVM for pt that NP called in potassium to her pharmacy to continue for a week due to low level.  And that scheduling should be calling to set up f/u lab/appt with NP.

## 2018-04-26 NOTE — Telephone Encounter (Signed)
Scheduled appt per 10/2 sch message  - pt is aware of appt date and time   

## 2018-04-26 NOTE — Telephone Encounter (Signed)
Gave avs and calendar added flush per May 11/6 could not adjust

## 2018-05-03 ENCOUNTER — Inpatient Hospital Stay (HOSPITAL_BASED_OUTPATIENT_CLINIC_OR_DEPARTMENT_OTHER): Payer: BC Managed Care – PPO | Admitting: Adult Health

## 2018-05-03 ENCOUNTER — Inpatient Hospital Stay: Payer: BC Managed Care – PPO

## 2018-05-03 VITALS — BP 146/87 | HR 96 | Temp 98.6°F | Resp 18 | Ht 62.0 in | Wt 251.4 lb

## 2018-05-03 DIAGNOSIS — Z170 Estrogen receptor positive status [ER+]: Secondary | ICD-10-CM

## 2018-05-03 DIAGNOSIS — Z79899 Other long term (current) drug therapy: Secondary | ICD-10-CM

## 2018-05-03 DIAGNOSIS — E876 Hypokalemia: Secondary | ICD-10-CM | POA: Diagnosis not present

## 2018-05-03 DIAGNOSIS — I10 Essential (primary) hypertension: Secondary | ICD-10-CM

## 2018-05-03 DIAGNOSIS — C50411 Malignant neoplasm of upper-outer quadrant of right female breast: Secondary | ICD-10-CM

## 2018-05-03 DIAGNOSIS — E119 Type 2 diabetes mellitus without complications: Secondary | ICD-10-CM | POA: Diagnosis not present

## 2018-05-03 DIAGNOSIS — B20 Human immunodeficiency virus [HIV] disease: Secondary | ICD-10-CM

## 2018-05-03 DIAGNOSIS — F419 Anxiety disorder, unspecified: Secondary | ICD-10-CM

## 2018-05-03 LAB — CMP (CANCER CENTER ONLY)
ALT: 27 U/L (ref 0–44)
AST: 23 U/L (ref 15–41)
Albumin: 3.2 g/dL — ABNORMAL LOW (ref 3.5–5.0)
Alkaline Phosphatase: 217 U/L — ABNORMAL HIGH (ref 38–126)
Anion gap: 11 (ref 5–15)
BUN: 7 mg/dL (ref 6–20)
CO2: 30 mmol/L (ref 22–32)
Calcium: 8.1 mg/dL — ABNORMAL LOW (ref 8.9–10.3)
Chloride: 99 mmol/L (ref 98–111)
Creatinine: 0.77 mg/dL (ref 0.44–1.00)
GFR, Est AFR Am: >60 mL/min (ref >60)
GFR, Estimated: >60 mL/min (ref >60)
Glucose, Bld: 247 mg/dL — ABNORMAL HIGH (ref 70–99)
Potassium: 3.4 mmol/L — ABNORMAL LOW (ref 3.5–5.1)
Sodium: 140 mmol/L (ref 135–145)
Total Bilirubin: 0.4 mg/dL (ref 0.3–1.2)
Total Protein: 6.4 g/dL — ABNORMAL LOW (ref 6.5–8.1)

## 2018-05-03 LAB — CBC WITH DIFFERENTIAL (CANCER CENTER ONLY)
Abs Immature Granulocytes: 0.67 10*3/uL — ABNORMAL HIGH (ref 0.00–0.07)
Basophils Absolute: 0 10*3/uL (ref 0.0–0.1)
Basophils Relative: 0 %
Eosinophils Absolute: 0 10*3/uL (ref 0.0–0.5)
Eosinophils Relative: 0 %
HCT: 29.1 % — ABNORMAL LOW (ref 36.0–46.0)
Hemoglobin: 9.2 g/dL — ABNORMAL LOW (ref 12.0–15.0)
Immature Granulocytes: 7 %
Lymphocytes Relative: 19 %
Lymphs Abs: 1.8 10*3/uL (ref 0.7–4.0)
MCH: 28.1 pg (ref 26.0–34.0)
MCHC: 31.6 g/dL (ref 30.0–36.0)
MCV: 89 fL (ref 80.0–100.0)
Monocytes Absolute: 0.5 10*3/uL (ref 0.1–1.0)
Monocytes Relative: 6 %
Neutro Abs: 6.6 10*3/uL (ref 1.7–7.7)
Neutrophils Relative %: 68 %
Platelet Count: 60 10*3/uL — ABNORMAL LOW (ref 150–400)
RBC: 3.27 MIL/uL — ABNORMAL LOW (ref 3.87–5.11)
RDW: 16.9 % — ABNORMAL HIGH (ref 11.5–15.5)
WBC Count: 9.7 10*3/uL (ref 4.0–10.5)
nRBC: 0.5 % — ABNORMAL HIGH (ref 0.0–0.2)

## 2018-05-03 NOTE — Progress Notes (Signed)
[facility]  [phone] [phone]     ID: [name] DOB: Sep 04, 1970  MR#: [id number]  JYN#:[id number]  Patient Care Team: Biagio Borg, MD as PCP - [name], MD as Consulting Physician (General Surgery) [name], [name], MD as Consulting Physician (Oncology) [name], MD as Attending Physician (Radiation Oncology) [name], NP as Nurse Practitioner (Obstetrics and Gynecology) Marlinda Mike, PA-C as Referring Physician (Physician Assistant) OTHER MD:   CHIEF COMPLAINT: Triple positive breast cancer  CURRENT TREATMENT: adjuvant chemotherapy   HISTORY OF CURRENT ILLNESS: From the original intake note:  "Kelly Rowe" noticed bruising in her lateral right breast and palpated a mass  on 12/23/2017. She followed up with her gynecologist. She underwent unilateral right diagnostic mammography with tomography and right breast ultrasonography at [facility] on 01/05/2018 showing: breast density category B. There is an irregular highly suspicious mass within the right breast at the 9:30 o'clock upper outer quadrant, measuring 1.8 x 1.1 x 1.5 cm, and located 18 cm from the nipple,  Ultrasonography revealed a single morphologically abnormal lymph node in the RIGHT axilla, with cortical thickness of 6 mm.   Accordingly on 01/11/2018 she proceeded to biopsy of the right breast area in question as well as a suspicious lymph node. The pathology from this procedure showed ([id number]): Invasive ductal carcinoma grade III.  The lymph node biopsied was negative for carcinoma (concordant).. Prognostic indicators significant for: estrogen receptor, 60% positive with weak staining intensity and progesterone receptor, 10% positive with strong staining intensity. Proliferation marker Ki67 at 30%. HER2 amplified with ratios HER2/CEP17 signals 2.26 and average HER2 copies per cell 3.50  The patient's subsequent history is as detailed  below.  INTERVAL HISTORY: Kelly Rowe returns today for follow up and treatment of her triple positive breast cancer. She is receiving neoadjuvant chemotherapy with Gemcitabine (previously Docetaxel), Carboplatin, and Trastuzumab,  given on day 1 of every 21 day cycle. She receives [place] support on day 3. Today is day 15 cycle 3.   REVIEW OF SYSTEMS: Kelly Rowe is here to f/u of her hypokalemia.  She has had no further diarrhea.  She has taken all of the postassium prescribed. She has increased her potassium rich foods.   She denies any other issues such as fevers, chills, nausea, vomiting, peripheral neuropathy.  A detailed ROS was otherwise non contributory.     PAST MEDICAL HISTORY: Past Medical History:  Diagnosis Date  . ANEMIA-IRON DEFICIENCY 01/30/2010  . ANXIETY 11/27/2007  . Arthritis   . Asthma 02/25/2011  . Cancer ([facility])    breast  . Carbuncle and furuncle of trunk 04/16/2010  . Chlamydia infection 03/21/2008  . DIABETES MELLITUS, TYPE II 08/02/2007  . Edema 01/30/2010  . ELEVATED BLOOD PRESSURE WITHOUT DIAGNOSIS OF HYPERTENSION 08/02/2007  . Family history of breast cancer   . Family history of colon cancer   . Family history of lung cancer   . FREQUENCY, URINARY 05/19/2009  . GENITAL HERPES 03/12/2009  . GERD (gastroesophageal reflux disease)   . History of kidney stones   . HIV (human immunodeficiency virus infection) ([facility]) 2009  . HIV INFECTION 03/12/2009  . HSV (herpes simplex virus) infection   . HYPERLIPIDEMIA 08/02/2007  . Hypertension   . Metrorrhagia 03/21/2008  . PONV (postoperative nausea and vomiting)    woke up crying per patient   . RETENTION, URINE 05/19/2009  . Trichomonas infection 01/19/2010  . VITAMIN D DEFICIENCY 01/30/2010    PAST SURGICAL HISTORY: Past Surgical  History:  Procedure Laterality Date  . ABDOMINAL HYSTERECTOMY  07/22/2010   TAH WITH PRESERVATION OF BOTH TUBES AND OVARIES  . BREAST LUMPECTOMY WITH RADIOACTIVE SEED AND SENTINEL LYMPH NODE BIOPSY  Right 02/23/2018   Procedure: BREAST LUMPECTOMY WITH RADIOACTIVE SEED AND SENTINEL LYMPH NODE BIOPSY;  Surgeon: Stark Klein, MD;  Location: [facility];  Service: General;  Laterality: Right;  . CHOLECYSTECTOMY    . ENDOMETRIAL ABLATION  01/11/2008   HER OPTION  . ESOPHAGOGASTRODUODENOSCOPY    . MULTIPLE TOOTH EXTRACTIONS    . PORTACATH PLACEMENT Left 02/23/2018   Procedure: INSERTION PORT-A-CATH;  Surgeon: Stark Klein, MD;  Location: [facility];  Service: General;  Laterality: Left;  . PORTACATH PLACEMENT N/A 04/21/2018   Procedure: PORT-A-CATH REVISION;  Surgeon: Stark Klein, MD;  Location: [facility];  Service: General;  Laterality: N/A;  . TUBAL LIGATION    . WISDOM TOOTH EXTRACTION      FAMILY HISTORY Family History  Problem Relation Age of Onset  . Prostate cancer Other   . Heart disease Other   . Stroke Other   . Diabetes Mother   . Hypertension Mother   . Diabetes Father   . Cancer Father        COLON and LU NG  . Stroke Paternal Uncle   . Breast cancer Paternal Aunt   . Lung cancer Maternal Grandmother 30       Mesothelioma  . Colon cancer Paternal Grandfather        dx over 48s  . Lung cancer Paternal Aunt   . Breast cancer Paternal Aunt   . Breast cancer Paternal Aunt   . Heart attack Maternal Grandfather 71  . Colon cancer Other        MGM's 5 brothers  The patient's father died at age 64 due to metastatic liver cancer. The patient's mother is alive at 87. The patient's has 1 brother and 3 sisters. There was a paternal grandfather with colon cancer. There were 3 paternal aunts with breast cancer, 2 diagnosed in the 16's and 1 at age 67. There was a maternal grandmother with mesothelioma. The patient otherwise denies a history of ovarian cancer in the family.    GYNECOLOGIC HISTORY:  Patient's last menstrual period was 06/21/2010. Menarche: 47 years old Age at first live birth: 47 years old She is GXP5. Her LMP was December 2011. She is status post partial hysterectomy  without oophorectomy. She took oral contraception for 3 years with no complications. She never took HRT.    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  At home are 2 of her sons, Kelly Rowe and Kelly Rowe, and the patient's grandson, Kelly Rowe 14 (who is Willie's son). The patient's oldest is [place] age 8 who lives in [place] as a cook. Daughter, [place] age 53 works as a Scientist, water quality. Son, Kelly Rowe age 42 is disabled. Son, Kelly Rowe age 49 lives in [place] as a Microbiologist. Daughter, Benard Rink age 30 also is a Microbiologist. The patient has 5 grandchildren and no great grandchildren. The patient does not belong to a church.        ADVANCED DIRECTIVES: Not in place; at the 01/18/2018 visit the patient was given the appropriate documents to complete on notarized at her discretion   HEALTH MAINTENANCE: Social History   Tobacco Use  . Smoking status: Never Smoker  . Smokeless tobacco: Never Used  Substance Use Topics  . Alcohol use: Never    Alcohol/week: 0.0 standard drinks    Frequency: Never  .  Drug use: No     Colonoscopy: Not yet  PAP: November 2018  Bone density: Never   Allergies  Allergen Reactions  . Levaquin [Levofloxacin In D5w] Nausea And Vomiting and Rash  . Penicillins Swelling and Rash    Has patient had a PCN reaction causing immediate rash, facial/tongue/throat swelling, SOB or lightheadedness with hypotension: Yes Has patient had a PCN reaction causing severe rash involving mucus membranes or skin necrosis: No Has patient had a PCN reaction that required hospitalization: Yes Has patient had a PCN reaction occurring within the last 10 years: No If all of the above answers are "NO", then may proceed with Cephalosporin use.    Current Outpatient Medications  Medication Sig Dispense Refill  . albuterol (PROVENTIL HFA;VENTOLIN HFA) 108 (90 Base) MCG/ACT inhaler Inhale 2 puffs into the lungs every 6 (six) hours as needed for wheezing or shortness of breath.    Marland Kitchen aspirin 81 MG tablet  Take 81 mg by mouth daily as needed (chest pain).     . Azelastine-Fluticasone (DYMISTA) 137-50 MCG/ACT SUSP Use as directed 1 spray each side twice per day as needed 23 g 5  . Beclomethasone Dipropionate (QNASL) 80 MCG/ACT AERS Place 2 sprays into the nose daily. 1 Inhaler 1  . bictegravir-emtricitabine-tenofovir AF (BIKTARVY) 50-200-25 MG TABS tablet Take 1 tablet by mouth daily.    . blood glucose meter kit and supplies Dispense based on patient and insurance preference. Use up to four times daily as directed. (FOR ICD-10 E10.9, E11.9). 1 each 0  . Diclofenac Sodium (PENNSAID) 2 % SOLN Place 1 application onto the skin 2 (two) times daily. (Patient taking differently: Place 1 application onto the skin 2 (two) times daily as needed. ) 1 Bottle 3  . Dulaglutide (TRULICITY) 1.5 YQ/6.5HQ SOPN 0.5 ml weekly SQ 6 mL 3  . famotidine (PEPCID) 20 MG tablet Take 1 tablet (20 mg total) by mouth 2 (two) times daily. (Patient taking differently: Take 20 mg by mouth daily as needed for heartburn. ) 60 tablet 3  . ferrous gluconate (IRON 27) 240 (27 FE) MG tablet Take 240 mg by mouth daily as needed (iron).     Marland Kitchen glipiZIDE (GLIPIZIDE XL) 5 MG 24 hr tablet Take 1 tablet (5 mg total) by mouth daily with breakfast. 90 tablet 3  . ibuprofen (ADVIL,MOTRIN) 800 MG tablet Take 1 tablet (800 mg total) by mouth every 8 (eight) hours as needed. (Patient taking differently: Take 800 mg by mouth daily as needed for moderate pain. ) 60 tablet 0  . lidocaine-prilocaine (EMLA) cream Apply to affected area once (Patient taking differently: Apply 1 application topically once. Apply to affected area once) 30 g 3  . loratadine (CLARITIN) 10 MG tablet Take 10 mg by mouth daily.    Marland Kitchen LORazepam (ATIVAN) 0.5 MG tablet Take 1 tablet (0.5 mg total) by mouth at bedtime as needed (Nausea or vomiting). 30 tablet 0  . metFORMIN (GLUCOPHAGE-XR) 500 MG 24 hr tablet Take 2 tablets (1,000 mg total) by mouth daily with breakfast. Annual appt is  due must see provider for future refills 180 tablet 3  . Multiple Vitamin (MULTIVITAMIN WITH MINERALS) TABS tablet Take 1 tablet by mouth daily.    Marland Kitchen omeprazole (PRILOSEC) 40 MG capsule Take 1 capsule (40 mg total) by mouth daily. 30 capsule 0  . ondansetron (ZOFRAN) 8 MG tablet Take 1 tablet (8 mg total) by mouth every 8 (eight) hours as needed for nausea or vomiting. 20 tablet 0  .  oxyCODONE (OXY IR/ROXICODONE) 5 MG immediate release tablet Take 1-2 tablets (5-10 mg total) by mouth every 6 (six) hours as needed for moderate pain, severe pain or breakthrough pain. 30 tablet 0  . potassium chloride (K-DUR) 10 MEQ tablet Take 1 tablet (10 mEq total) by mouth daily. Take 40 meq BID x 2 days then 20 meq BID x 5 days 36 tablet 0  . prochlorperazine (COMPAZINE) 10 MG tablet Take 1 tablet (10 mg total) by mouth every 6 (six) hours as needed (Nausea or vomiting). 30 tablet 1  . valACYclovir (VALTREX) 500 MG tablet Take twice daily for 3-5 days 30 tablet 12  . zolpidem (AMBIEN) 10 MG tablet Take 1 tablet (10 mg total) by mouth at bedtime as needed for sleep. 90 tablet 1   No current facility-administered medications for this visit.    Facility-Administered Medications Ordered in Other Visits  Medication Dose Route Frequency Provider Last Rate Last Dose  . 0.9 %  sodium chloride infusion   Intravenous Once [name], [name], MD      . sodium chloride 0.9 % 1,000 mL with potassium chloride 10 mEq infusion   Intravenous Once [name], [name], MD        OBJECTIVE:  Vitals:   05/03/18 1103  BP: (!) 146/87  Pulse: 96  Resp: 18  Temp: 98.6 F (37 C)  SpO2: 100%     Body mass index is 45.98 kg/m.   Wt Readings from Last 3 Encounters:  05/03/18 251 lb 6.4 oz (114 kg)  04/26/18 245 lb 1.6 oz (111.2 kg)  04/21/18 248 lb 6 oz (112.7 kg)   ECOG FS: 1 - Symptomatic but completely ambulatory  GENERAL: Patient is a well appearing female in no acute distress HEENT:  Sclerae anicteric.  Oropharynx  clear and moist. No ulcerations or evidence of oropharyngeal candidiasis. Neck is supple.  NODES:  No cervical, supraclavicular, or axillary lymphadenopathy palpated.  BREAST EXAM:  Deferred. LUNGS:  Clear to auscultation bilaterally.  No wheezes or rhonchi. HEART:  Regular rate and rhythm. No murmur appreciated. ABDOMEN:  Soft, nontender.  Positive, normoactive bowel sounds. No organomegaly palpated. MSK:  No focal spinal tenderness to palpation. Full range of motion bilaterally in the upper extremities. EXTREMITIES:  No peripheral edema.   SKIN:  Clear with no obvious rashes or skin changes. No nail dyscrasia. NEURO:  Nonfocal. Well oriented.  Appropriate affect.    LAB RESULTS:  CMP     Component Value Date/Time   NA 140 05/03/2018 1027   K 3.4 (L) 05/03/2018 1027   CL 99 05/03/2018 1027   CO2 30 05/03/2018 1027   GLUCOSE 247 (H) 05/03/2018 1027   BUN 7 05/03/2018 1027   CREATININE 0.77 05/03/2018 1027   CALCIUM 8.1 (L) 05/03/2018 1027   PROT 6.4 (L) 05/03/2018 1027   ALBUMIN 3.2 (L) 05/03/2018 1027   AST 23 05/03/2018 1027   ALT 27 05/03/2018 1027   ALKPHOS 217 (H) 05/03/2018 1027   BILITOT 0.4 05/03/2018 1027   GFRNONAA >60 05/03/2018 1027   GFRAA >60 05/03/2018 1027    No results found for: TOTALPROTELP, ALBUMINELP, A1GS, A2GS, BETS, BETA2SER, GAMS, MSPIKE, SPEI  No results found for: KPAFRELGTCHN, LAMBDASER, KAPLAMBRATIO  Lab Results  Component Value Date   WBC 9.7 05/03/2018   NEUTROABS 6.6 05/03/2018   HGB 9.2 (L) 05/03/2018   HCT 29.1 (L) 05/03/2018   MCV 89.0 05/03/2018   PLT 60 (L) 05/03/2018    _0 @  No results  found for: LABCA2  No components found for: [id number]  No results for input(s): INR in the last 168 hours.  No results found for: LABCA2  No results found for: WUJ811  No results found for: BJY782  No results found for: NFA213  No results found for: CA2729  No components found for: HGQUANT  No results found for: CEA1  / No results found for: CEA1   No results found for: AFPTUMOR  No results found for: [place]  No results found for: PSA1  Appointment on 05/03/2018  Component Date Value Ref Range Status  . WBC Count 05/03/2018 9.7  4.0 - 10.5 K/uL Final  . RBC 05/03/2018 3.27* 3.87 - 5.11 MIL/uL Final  . Hemoglobin 05/03/2018 9.2* 12.0 - 15.0 g/dL Final  . HCT 05/03/2018 29.1* 36.0 - 46.0 % Final  . MCV 05/03/2018 89.0  80.0 - 100.0 fL Final  . MCH 05/03/2018 28.1  26.0 - 34.0 pg Final  . MCHC 05/03/2018 31.6  30.0 - 36.0 g/dL Final  . RDW 05/03/2018 16.9* 11.5 - 15.5 % Final  . Platelet Count 05/03/2018 60* 150 - 400 K/uL Final  . nRBC 05/03/2018 0.5* 0.0 - 0.2 % Final  . Neutrophils Relative % 05/03/2018 68  % Final  . Neutro Abs 05/03/2018 6.6  1.7 - 7.7 K/uL Final  . Lymphocytes Relative 05/03/2018 19  % Final  . Lymphs Abs 05/03/2018 1.8  0.7 - 4.0 K/uL Final  . Monocytes Relative 05/03/2018 6  % Final  . Monocytes Absolute 05/03/2018 0.5  0.1 - 1.0 K/uL Final  . Eosinophils Relative 05/03/2018 0  % Final  . Eosinophils Absolute 05/03/2018 0.0  0.0 - 0.5 K/uL Final  . Basophils Relative 05/03/2018 0  % Final  . Basophils Absolute 05/03/2018 0.0  0.0 - 0.1 K/uL Final  . Immature Granulocytes 05/03/2018 7  % Final   Increased IG's, likely caused by Bone Marrow Colony Stimulating Factor received within 30 days.  . Abs Immature Granulocytes 05/03/2018 0.67* 0.00 - 0.07 K/uL Final   Performed at [facility], [place] [address] [place], [place] [address]  . Sodium 05/03/2018 140  135 - 145 mmol/L Final  . Potassium 05/03/2018 3.4* 3.5 - 5.1 mmol/L Final  . Chloride 05/03/2018 99  98 - 111 mmol/L Final  . CO2 05/03/2018 30  22 - 32 mmol/L Final  . Glucose, Bld 05/03/2018 247* 70 - 99 mg/dL Final  . BUN 05/03/2018 7  6 - 20 mg/dL Final  . Creatinine 05/03/2018 0.77  0.44 - 1.00 mg/dL Final  . Calcium 05/03/2018 8.1* 8.9 - 10.3 mg/dL Final  . Total Protein 05/03/2018  6.4* 6.5 - 8.1 g/dL Final  . Albumin 05/03/2018 3.2* 3.5 - 5.0 g/dL Final  . AST 05/03/2018 23  15 - 41 U/L Final  . ALT 05/03/2018 27  0 - 44 U/L Final  . Alkaline Phosphatase 05/03/2018 217* 38 - 126 U/L Final  . Total Bilirubin 05/03/2018 0.4  0.3 - 1.2 mg/dL Final  . GFR, Est Non Af Am 05/03/2018 >60  >60 mL/min Final  . GFR, Est AFR Am 05/03/2018 >60  >60 mL/min Final   Comment: (NOTE) The eGFR has been calculated using the CKD EPI equation. This calculation has not been validated in all clinical situations. eGFR's persistently <60 mL/min signify possible Chronic Kidney Disease.   Georgiann Hahn gap 05/03/2018 11  5 - 15 Final   Performed at [facility], [address]  [place], [place] [address]    (this displays the last labs from the last 3 days)  No results found for: TOTALPROTELP, ALBUMINELP, A1GS, A2GS, BETS, BETA2SER, GAMS, MSPIKE, SPEI (this displays SPEP labs)  No results found for: KPAFRELGTCHN, LAMBDASER, KAPLAMBRATIO (kappa/lambda light chains)  No results found for: HGBA, HGBA2QUANT, HGBFQUANT, HGBSQUAN (Hemoglobinopathy evaluation)   No results found for: LDH  Lab Results  Component Value Date   IRON 35 (L) 01/30/2010   IRONPCTSAT 8.8 (L) 01/30/2010   (Iron and TIBC)  No results found for: FERRITIN  Urinalysis    Component Value Date/Time   COLORURINE RED (A) 04/01/2018 0202   APPEARANCEUR HAZY (A) 04/01/2018 0202   LABSPEC 1.021 04/01/2018 0202   PHURINE 6.0 04/01/2018 0202   GLUCOSEU >=500 (A) 04/01/2018 0202   GLUCOSEU NEGATIVE 09/27/2017 1002   HGBUR LARGE (A) 04/01/2018 0202   BILIRUBINUR NEGATIVE 04/01/2018 0202   KETONESUR 5 (A) 04/01/2018 0202   PROTEINUR 100 (A) 04/01/2018 0202   UROBILINOGEN 0.2 09/27/2017 1002   NITRITE NEGATIVE 04/01/2018 0202   LEUKOCYTESUR NEGATIVE 04/01/2018 0202     STUDIES: Dg Chest 2 View  Result Date: 04/07/2018 CLINICAL DATA:  Check Port-A-Cath placement. History of breast cancer.  EXAM: CHEST - 2 VIEW COMPARISON:  02/23/2018 FINDINGS: Left subclavian approach injectable port terminates at the expected location of the junction of brachiocephalic vein and superior vena cava. Mild kinking of the catheter is seen at the level of the second rib laterally. Cardiomediastinal silhouette is normal. Mediastinal contours appear intact. There is no evidence of focal airspace consolidation, pleural effusion or pneumothorax. Osseous structures are without acute abnormality. Postsurgical changes in the right chest wall. IMPRESSION: Left subclavian approach injectable port terminates at the expected location of the junction of the brachiocephalic vein and superior vena cava. Mild kinking of the catheter seen at the level of the lateral second rib. No pneumothorax. Electronically Signed   By: Fidela Salisbury M.D.   On: 04/07/2018 11:15   Dg Chest Port 1 View  Result Date: 04/21/2018 CLINICAL DATA:  Status post Port-A-Cath placement. EXAM: PORTABLE CHEST 1 VIEW COMPARISON:  04/07/2018 FINDINGS: New left anterior chest wall Port-A-Cath extends through the subclavian vein. Tip projects in the right atrium. No pneumothorax.  No acute findings in the lungs. Cardiac silhouette is normal in size. No mediastinal or hilar masses. Stable changes from prior right breast surgery. IMPRESSION: 1. Left anterior chest wall Port-A-Cath. Tip projects in the right atrium. 2. No pneumothorax.  No acute cardiopulmonary disease. Electronically Signed   By: Lajean Manes M.D.   On: 04/21/2018 11:17   Dg C-arm 1-60 Min-no Report  Result Date: 04/21/2018 Fluoroscopy was utilized by the requesting physician.  No radiographic interpretation.    ELIGIBLE FOR AVAILABLE RESEARCH PROTOCOL: no  ASSESSMENT: 47 y.o. Kelly Rowe, [name] woman status post right breast upper outer quadrant biopsy 01/11/2018 for a clinical T1 N0, stage IA invasive ductal carcinoma, grade 3, estrogen and progesterone receptor positive, HER-2 amplified,  with an MIB-1 of 30%.  (1) status post right lumpectomy and sentinel lymph node sampling 02/23/2018 for a pT2 pN1, stage IB invasive ductal carcinoma, grade 3, with close but negative margins  (2) adjuvant chemotherapy consisting of carboplatin, docetaxel, trastuzumab and Pertuzumab every 21 days x 6 started 03/08/2018  (a) pertuzumab stopped after cycle 1 due to diarrhea  (b) docetaxel changed to gemcitabine after cycle 2 due to persistent hyperglycemia  (3) anti-HER-2 immunotherapy to start concurrently with chemotherapy  (a) baseline echocardiogram on 02/07/2018  showed an ejection fraction in the 55-60% range  (4) adjuvant radiation to follow  (5) antiestrogens to follow at the completion of local treatment  (6) genetics testing through [facility] Multi-cancer and Breast panel on 01/13/2018 showed: no deleterious mutations. The following genes were evaluated for sequence changes and exonic deletions/duplications: ALK, APC, ATM, AXIN2, BAP1, BARD1, BLM, BMPR1A, BRCA1, BRCA2, BRIP1, CASR, CDC73, CDH1, CDK4, CDKN1B, CDKN1C,CDKN2A (p14ARF), CDKN2A (p16INK4a), CEBPA, CHEK2, CTNNA1, DICER1, DIS3L2, EPCAM*, FH, FLCN, GATA2, GPC3, GREM1*, HRAS, KIT, MAX, MEN1, MET, MLH1, MSH2, MSH3, MSH6, MUTYH, NBN, NF1, NF2, PALB2, PDGFRA, PHOX2B*, PMS2, POLD1,Rowe, POT1, PRKAR1A, PTCH1, PTEN, RAD50, RAD51C, RAD51D, RB1, RECQL4, RET, RUNX1, SDHAF2, SDHB, SDHC, SDHD,SMAD4, SMARCA4, SMARCB1, SMARCE1, STK11, SUFU, TERC, TERT, TMEM127, TP53, TSC1, TSC2, VHL, WRN*, WT1.The following genes were evaluated for sequence changes only: EGFR*, HOXB13*, MITF*, NTHL1*, SDHA  PLAN: Kelly Rowe is doing much better today than last week.  Her potassium level has improved from 2.6 to 3.4.  I reviewed with her why keeping the potassium at a normal level is important for her overall well being, particularly cardiac function.  She was unaware of what could happen if her potassium level was too decreased.  She is eating potassium rich foods.     Kelly Rowe will return next week for labs and her next cycle of adjuvant chemotherapy.  She knows to call for any questions or concerns prior to her next appointment with [place].    A total of (20) minutes of face-to-face time was spent with this patient with greater than 50% of that time in counseling and care-coordination.   Wilber Bihari, NP 05/05/18 7:49 AM Medical Oncology and Hematology [facility] [address] [place], [place] [address] Tel. [phone]    Fax. [phone]

## 2018-05-05 ENCOUNTER — Encounter: Payer: Self-pay | Admitting: Adult Health

## 2018-05-10 ENCOUNTER — Inpatient Hospital Stay: Payer: BC Managed Care – PPO

## 2018-05-10 ENCOUNTER — Encounter: Payer: Self-pay | Admitting: Adult Health

## 2018-05-10 ENCOUNTER — Telehealth: Payer: Self-pay | Admitting: Adult Health

## 2018-05-10 ENCOUNTER — Inpatient Hospital Stay (HOSPITAL_BASED_OUTPATIENT_CLINIC_OR_DEPARTMENT_OTHER): Payer: BC Managed Care – PPO | Admitting: Adult Health

## 2018-05-10 ENCOUNTER — Other Ambulatory Visit: Payer: BC Managed Care – PPO

## 2018-05-10 VITALS — BP 117/66 | HR 86 | Temp 98.7°F | Resp 18

## 2018-05-10 DIAGNOSIS — Z95828 Presence of other vascular implants and grafts: Secondary | ICD-10-CM

## 2018-05-10 DIAGNOSIS — Z79899 Other long term (current) drug therapy: Secondary | ICD-10-CM

## 2018-05-10 DIAGNOSIS — Z170 Estrogen receptor positive status [ER+]: Secondary | ICD-10-CM

## 2018-05-10 DIAGNOSIS — E119 Type 2 diabetes mellitus without complications: Secondary | ICD-10-CM | POA: Diagnosis not present

## 2018-05-10 DIAGNOSIS — C50911 Malignant neoplasm of unspecified site of right female breast: Secondary | ICD-10-CM

## 2018-05-10 DIAGNOSIS — C773 Secondary and unspecified malignant neoplasm of axilla and upper limb lymph nodes: Secondary | ICD-10-CM

## 2018-05-10 DIAGNOSIS — I10 Essential (primary) hypertension: Secondary | ICD-10-CM

## 2018-05-10 DIAGNOSIS — C50411 Malignant neoplasm of upper-outer quadrant of right female breast: Secondary | ICD-10-CM

## 2018-05-10 LAB — CBC WITH DIFFERENTIAL (CANCER CENTER ONLY)
Abs Immature Granulocytes: 0.06 10*3/uL (ref 0.00–0.07)
Basophils Absolute: 0 10*3/uL (ref 0.0–0.1)
Basophils Relative: 0 %
Eosinophils Absolute: 0 10*3/uL (ref 0.0–0.5)
Eosinophils Relative: 1 %
HCT: 28.9 % — ABNORMAL LOW (ref 36.0–46.0)
Hemoglobin: 9.1 g/dL — ABNORMAL LOW (ref 12.0–15.0)
Immature Granulocytes: 1 %
Lymphocytes Relative: 37 %
Lymphs Abs: 1.6 10*3/uL (ref 0.7–4.0)
MCH: 28.3 pg (ref 26.0–34.0)
MCHC: 31.5 g/dL (ref 30.0–36.0)
MCV: 90 fL (ref 80.0–100.0)
Monocytes Absolute: 0.4 10*3/uL (ref 0.1–1.0)
Monocytes Relative: 9 %
Neutro Abs: 2.3 10*3/uL (ref 1.7–7.7)
Neutrophils Relative %: 52 %
Platelet Count: 234 10*3/uL (ref 150–400)
RBC: 3.21 MIL/uL — ABNORMAL LOW (ref 3.87–5.11)
RDW: 19 % — ABNORMAL HIGH (ref 11.5–15.5)
WBC Count: 4.4 10*3/uL (ref 4.0–10.5)
nRBC: 0 % (ref 0.0–0.2)

## 2018-05-10 LAB — CMP (CANCER CENTER ONLY)
ALT: 25 U/L (ref 0–44)
AST: 25 U/L (ref 15–41)
Albumin: 3.3 g/dL — ABNORMAL LOW (ref 3.5–5.0)
Alkaline Phosphatase: 174 U/L — ABNORMAL HIGH (ref 38–126)
Anion gap: 12 (ref 5–15)
BUN: 8 mg/dL (ref 6–20)
CO2: 27 mmol/L (ref 22–32)
Calcium: 8.6 mg/dL — ABNORMAL LOW (ref 8.9–10.3)
Chloride: 100 mmol/L (ref 98–111)
Creatinine: 0.82 mg/dL (ref 0.44–1.00)
GFR, Est AFR Am: >60 mL/min (ref >60)
GFR, Estimated: >60 mL/min (ref >60)
Glucose, Bld: 276 mg/dL — ABNORMAL HIGH (ref 70–99)
Potassium: 3.5 mmol/L (ref 3.5–5.1)
Sodium: 139 mmol/L (ref 135–145)
Total Bilirubin: 0.4 mg/dL (ref 0.3–1.2)
Total Protein: 6.7 g/dL (ref 6.5–8.1)

## 2018-05-10 MED ORDER — SODIUM CHLORIDE 0.9 % IV SOLN
750.0000 mg | Freq: Once | INTRAVENOUS | Status: AC
  Administered 2018-05-10: 750 mg via INTRAVENOUS
  Filled 2018-05-10: qty 75

## 2018-05-10 MED ORDER — DIPHENHYDRAMINE HCL 25 MG PO CAPS
25.0000 mg | ORAL_CAPSULE | Freq: Once | ORAL | Status: AC
  Administered 2018-05-10: 25 mg via ORAL

## 2018-05-10 MED ORDER — SODIUM CHLORIDE 0.9 % IV SOLN
Freq: Once | INTRAVENOUS | Status: AC
  Administered 2018-05-10: 10:00:00 via INTRAVENOUS
  Filled 2018-05-10: qty 250

## 2018-05-10 MED ORDER — PALONOSETRON HCL INJECTION 0.25 MG/5ML
INTRAVENOUS | Status: AC
  Filled 2018-05-10: qty 5

## 2018-05-10 MED ORDER — ACETAMINOPHEN 325 MG PO TABS
650.0000 mg | ORAL_TABLET | Freq: Once | ORAL | Status: AC
  Administered 2018-05-10: 650 mg via ORAL

## 2018-05-10 MED ORDER — SODIUM CHLORIDE 0.9 % IV SOLN
800.0000 mg/m2 | Freq: Once | INTRAVENOUS | Status: AC
  Administered 2018-05-10: 1824 mg via INTRAVENOUS
  Filled 2018-05-10: qty 47.97

## 2018-05-10 MED ORDER — SODIUM CHLORIDE 0.9 % IV SOLN
Freq: Once | INTRAVENOUS | Status: AC
  Administered 2018-05-10: 11:00:00 via INTRAVENOUS
  Filled 2018-05-10: qty 5

## 2018-05-10 MED ORDER — TRASTUZUMAB CHEMO 150 MG IV SOLR
6.0000 mg/kg | Freq: Once | INTRAVENOUS | Status: AC
  Administered 2018-05-10: 714 mg via INTRAVENOUS
  Filled 2018-05-10: qty 34

## 2018-05-10 MED ORDER — SODIUM CHLORIDE 0.9% FLUSH
10.0000 mL | Freq: Once | INTRAVENOUS | Status: AC
  Administered 2018-05-10: 10 mL
  Filled 2018-05-10: qty 10

## 2018-05-10 MED ORDER — PALONOSETRON HCL INJECTION 0.25 MG/5ML
0.2500 mg | Freq: Once | INTRAVENOUS | Status: AC
  Administered 2018-05-10: 0.25 mg via INTRAVENOUS

## 2018-05-10 MED ORDER — DIPHENHYDRAMINE HCL 25 MG PO CAPS
ORAL_CAPSULE | ORAL | Status: AC
  Filled 2018-05-10: qty 1

## 2018-05-10 MED ORDER — ACETAMINOPHEN 325 MG PO TABS
ORAL_TABLET | ORAL | Status: AC
  Filled 2018-05-10: qty 2

## 2018-05-10 MED ORDER — SODIUM CHLORIDE 0.9% FLUSH
10.0000 mL | INTRAVENOUS | Status: DC | PRN
  Administered 2018-05-10: 10 mL
  Filled 2018-05-10: qty 10

## 2018-05-10 MED ORDER — HEPARIN SOD (PORK) LOCK FLUSH 100 UNIT/ML IV SOLN
500.0000 [IU] | Freq: Once | INTRAVENOUS | Status: AC | PRN
  Administered 2018-05-10: 500 [IU]
  Filled 2018-05-10: qty 5

## 2018-05-10 NOTE — Patient Instructions (Signed)
[place] Discharge Instructions for Patients Receiving Chemotherapy  Today you received the following chemotherapy agents: Herceptin, Gemzar, Carboplatin.  To help prevent nausea and vomiting after your treatment, we encourage you to take your nausea medication as prescribed.   If you develop nausea and vomiting that is not controlled by your nausea medication, call the clinic.   BELOW ARE SYMPTOMS THAT SHOULD BE REPORTED IMMEDIATELY:  *FEVER GREATER THAN 100.5 F  *CHILLS WITH OR WITHOUT FEVER  NAUSEA AND VOMITING THAT IS NOT CONTROLLED WITH YOUR NAUSEA MEDICATION  *UNUSUAL SHORTNESS OF BREATH  *UNUSUAL BRUISING OR BLEEDING  TENDERNESS IN MOUTH AND THROAT WITH OR WITHOUT PRESENCE OF ULCERS  *URINARY PROBLEMS  *BOWEL PROBLEMS  UNUSUAL RASH Items with * indicate a potential emergency and should be followed up as soon as possible.  Feel free to call the clinic should you have any questions or concerns. The clinic phone number is [phone].  Please show the [facility] at check-in to the Emergency Department and triage nurse.     Gemcitabine injection What is this medicine? GEMCITABINE (jem SIT a been) is a chemotherapy drug. This medicine is used to treat many types of cancer like breast cancer, lung cancer, pancreatic cancer, and ovarian cancer. This medicine may be used for other purposes; ask your health care provider or pharmacist if you have questions. COMMON BRAND NAME(S): Gemzar What should I tell my health care provider before I take this medicine? They need to know if you have any of these conditions: -blood disorders -infection -kidney disease -liver disease -recent or ongoing radiation therapy -an unusual or allergic reaction to gemcitabine, other chemotherapy, other medicines, foods, dyes, or preservatives -pregnant or trying to get pregnant -breast-feeding How should I use this medicine? This drug is given as an infusion into a  vein. It is administered in a hospital or clinic by a specially trained health care professional. Talk to your pediatrician regarding the use of this medicine in children. Special care may be needed. Overdosage: If you think you have taken too much of this medicine contact a poison control center or emergency room at once. NOTE: This medicine is only for you. Do not share this medicine with others. What if I miss a dose? It is important not to miss your dose. Call your doctor or health care professional if you are unable to keep an appointment. What may interact with this medicine? -medicines to increase blood counts like filgrastim, pegfilgrastim, sargramostim -some other chemotherapy drugs like cisplatin -vaccines Talk to your doctor or health care professional before taking any of these medicines: -acetaminophen -aspirin -ibuprofen -ketoprofen -naproxen This list may not describe all possible interactions. Give your health care provider a list of all the medicines, herbs, non-prescription drugs, or dietary supplements you use. Also tell them if you smoke, drink alcohol, or use illegal drugs. Some items may interact with your medicine. What should I watch for while using this medicine? Visit your doctor for checks on your progress. This drug may make you feel generally unwell. This is not uncommon, as chemotherapy can affect healthy cells as well as cancer cells. Report any side effects. Continue your course of treatment even though you feel ill unless your doctor tells you to stop. In some cases, you may be given additional medicines to help with side effects. Follow all directions for their use. Call your doctor or health care professional for advice if you get a fever, chills or sore throat, or other  symptoms of a cold or flu. Do not treat yourself. This drug decreases your body's ability to fight infections. Try to avoid being around people who are sick. This medicine may increase your  risk to bruise or bleed. Call your doctor or health care professional if you notice any unusual bleeding. Be careful brushing and flossing your teeth or using a toothpick because you may get an infection or bleed more easily. If you have any dental work done, tell your dentist you are receiving this medicine. Avoid taking products that contain aspirin, acetaminophen, ibuprofen, naproxen, or ketoprofen unless instructed by your doctor. These medicines may hide a fever. Women should inform their doctor if they wish to become pregnant or think they might be pregnant. There is a potential for serious side effects to an unborn child. Talk to your health care professional or pharmacist for more information. Do not breast-feed an infant while taking this medicine. What side effects may I notice from receiving this medicine? Side effects that you should report to your doctor or health care professional as soon as possible: -allergic reactions like skin rash, itching or hives, swelling of the face, lips, or tongue -low blood counts - this medicine may decrease the number of white blood cells, red blood cells and platelets. You may be at increased risk for infections and bleeding. -signs of infection - fever or chills, cough, sore throat, pain or difficulty passing urine -signs of decreased platelets or bleeding - bruising, pinpoint red spots on the skin, black, tarry stools, blood in the urine -signs of decreased red blood cells - unusually weak or tired, fainting spells, lightheadedness -breathing problems -chest pain -mouth sores -nausea and vomiting -pain, swelling, redness at site where injected -pain, tingling, numbness in the hands or feet -stomach pain -swelling of ankles, feet, hands -unusual bleeding Side effects that usually do not require medical attention (report to your doctor or health care professional if they continue or are bothersome): -constipation -diarrhea -hair loss -loss of  appetite -stomach upset This list may not describe all possible side effects. Call your doctor for medical advice about side effects. You may report side effects to FDA at 1-800-FDA-1088. Where should I keep my medicine? This drug is given in a hospital or clinic and will not be stored at home. NOTE: This sheet is a summary. It may not cover all possible information. If you have questions about this medicine, talk to your doctor, pharmacist, or health care provider.  2018 Elsevier/Gold Standard (2007-11-21 18:45:54)

## 2018-05-10 NOTE — Telephone Encounter (Signed)
Per 10/16 no los

## 2018-05-10 NOTE — Progress Notes (Signed)
[place]  [phone]) [phone] Fax:(336) [phone]     ID: ERVIN ROTHBAUER DOB: 1971-05-08  MR#: [id number]  FWY#:[id number]  Patient Care Team: Biagio Borg, MD as PCP - [name], MD as Consulting Physician (General Surgery) [name], [name], MD as Consulting Physician (Oncology) [name], MD as Attending Physician (Radiation Oncology) [name], NP as Nurse Practitioner (Obstetrics and Gynecology) Marlinda Mike, PA-C as Referring Physician (Physician Assistant) OTHER MD:   CHIEF COMPLAINT: Triple positive breast cancer  CURRENT TREATMENT: adjuvant chemotherapy   HISTORY OF CURRENT ILLNESS: From the original intake note:  "Orville" noticed bruising in her lateral right breast and palpated a mass  on 12/23/2017. She followed up with her gynecologist. She underwent unilateral right diagnostic mammography with tomography and right breast ultrasonography at [facility] on 01/05/2018 showing: breast density category B. There is an irregular highly suspicious mass within the right breast at the 9:30 o'clock upper outer quadrant, measuring 1.8 x 1.1 x 1.5 cm, and located 18 cm from the nipple,  Ultrasonography revealed a single morphologically abnormal lymph node in the RIGHT axilla, with cortical thickness of 6 mm.   Accordingly on 01/11/2018 she proceeded to biopsy of the right breast area in question as well as a suspicious lymph node. The pathology from this procedure showed ([id number]): Invasive ductal carcinoma grade III.  The lymph node biopsied was negative for carcinoma (concordant).. Prognostic indicators significant for: estrogen receptor, 60% positive with weak staining intensity and progesterone receptor, 10% positive with strong staining intensity. Proliferation marker Ki67 at 30%. HER2 amplified with ratios HER2/CEP17 signals 2.26 and average HER2 copies per cell 3.50  The patient's subsequent history is as detailed  below.  INTERVAL HISTORY: Emani returns today for follow up and treatment of her triple positive breast cancer. She is receiving adjuvant chemotherapy with Gemcitabine (previously Docetaxel), Carboplatin, and Trastuzumab,  given on day 1 of every 21 day cycle. She receives [place] support on day 3. Today is day 1 cycle 4.   REVIEW OF SYSTEMS: Eniya is doing well today. She denies any recent issues such as fevers, chills, nausea, vomiting, constipation, diarrhea, headache, unusual vision issues, chest pain, cough, shortness of breath, or palpitations.  She notes that she does get slightly more short of breath when walking up the stairs. She notes that she has not been nearly as active during chemotherapy as she was prior to chemotherapy.  She notes that she does feel deconditioned and weaker.    Ariely is otherwise doing well.  She is taking her diabetes medications.  She is not following a diabetic diet.  A detailed ROS was otherwise non contributory.     PAST MEDICAL HISTORY: Past Medical History:  Diagnosis Date  . ANEMIA-IRON DEFICIENCY 01/30/2010  . ANXIETY 11/27/2007  . Arthritis   . Asthma 02/25/2011  . Cancer ([place])    breast  . Carbuncle and furuncle of trunk 04/16/2010  . Chlamydia infection 03/21/2008  . DIABETES MELLITUS, TYPE II 08/02/2007  . Edema 01/30/2010  . ELEVATED BLOOD PRESSURE WITHOUT DIAGNOSIS OF HYPERTENSION 08/02/2007  . Family history of breast cancer   . Family history of colon cancer   . Family history of lung cancer   . FREQUENCY, URINARY 05/19/2009  . GENITAL HERPES 03/12/2009  . GERD (gastroesophageal reflux disease)   . History of kidney stones   . HIV (human immunodeficiency virus infection) ([place]) 2009  . HIV INFECTION 03/12/2009  . HSV (herpes simplex virus) infection   .  HYPERLIPIDEMIA 08/02/2007  . Hypertension   . Metrorrhagia 03/21/2008  . PONV (postoperative nausea and vomiting)    woke up crying per patient   . RETENTION, URINE 05/19/2009  .  Trichomonas infection 01/19/2010  . VITAMIN D DEFICIENCY 01/30/2010    PAST SURGICAL HISTORY: Past Surgical History:  Procedure Laterality Date  . ABDOMINAL HYSTERECTOMY  07/22/2010   TAH WITH PRESERVATION OF BOTH TUBES AND OVARIES  . BREAST LUMPECTOMY WITH RADIOACTIVE SEED AND SENTINEL LYMPH NODE BIOPSY Right 02/23/2018   Procedure: BREAST LUMPECTOMY WITH RADIOACTIVE SEED AND SENTINEL LYMPH NODE BIOPSY;  Surgeon: Stark Klein, MD;  Location: [facility];  Service: General;  Laterality: Right;  . CHOLECYSTECTOMY    . ENDOMETRIAL ABLATION  01/11/2008   HER OPTION  . ESOPHAGOGASTRODUODENOSCOPY    . MULTIPLE TOOTH EXTRACTIONS    . PORTACATH PLACEMENT Left 02/23/2018   Procedure: INSERTION PORT-A-CATH;  Surgeon: Stark Klein, MD;  Location: [facility];  Service: General;  Laterality: Left;  . PORTACATH PLACEMENT N/A 04/21/2018   Procedure: PORT-A-CATH REVISION;  Surgeon: Stark Klein, MD;  Location: [facility];  Service: General;  Laterality: N/A;  . TUBAL LIGATION    . WISDOM TOOTH EXTRACTION      FAMILY HISTORY Family History  Problem Relation Age of Onset  . Prostate cancer Other   . Heart disease Other   . Stroke Other   . Diabetes Mother   . Hypertension Mother   . Diabetes Father   . Cancer Father        COLON and LU NG  . Stroke Paternal Uncle   . Breast cancer Paternal Aunt   . Lung cancer Maternal Grandmother 19       Mesothelioma  . Colon cancer Paternal Grandfather        dx over 46s  . Lung cancer Paternal Aunt   . Breast cancer Paternal Aunt   . Breast cancer Paternal Aunt   . Heart attack Maternal Grandfather 71  . Colon cancer Other        MGM's 5 brothers  The patient's father died at age 77 due to metastatic liver cancer. The patient's mother is alive at 51. The patient's has 1 brother and 3 sisters. There was a paternal grandfather with colon cancer. There were 3 paternal aunts with breast cancer, 2 diagnosed in the 40's and 1 at age 47. There was a maternal grandmother with  mesothelioma. The patient otherwise denies a history of ovarian cancer in the family.    GYNECOLOGIC HISTORY:  Patient's last menstrual period was 06/21/2010. Menarche: 47 years old Age at first live birth: 47 years old She is GXP5. Her LMP was December 2011. She is status post partial hysterectomy without oophorectomy. She took oral contraception for 3 years with no complications. She never took HRT.    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  At home are 2 of her sons, Glendell Docker and [name], and the patient's grandson, Amador Cunas 32 (who is Willie's son). The patient's oldest is [place] age 25 who lives in [place] as a cook. Daughter, [place] age 65 works as a Scientist, water quality. Son, Glendell Docker age 66 is disabled. Son, [name] age 69 lives in [place] as a Microbiologist. Daughter, Benard Rink age 43 also is a Microbiologist. The patient has 5 grandchildren and no great grandchildren. The patient does not belong to a church.        ADVANCED DIRECTIVES: Not in place; at the 01/18/2018 visit the patient was given the appropriate documents to  complete on notarized at her discretion   HEALTH MAINTENANCE: Social History   Tobacco Use  . Smoking status: Never Smoker  . Smokeless tobacco: Never Used  Substance Use Topics  . Alcohol use: Never    Alcohol/week: 0.0 standard drinks    Frequency: Never  . Drug use: No     Colonoscopy: Not yet  PAP: November 2018  Bone density: Never   Allergies  Allergen Reactions  . Levaquin [Levofloxacin In D5w] Nausea And Vomiting and Rash  . Penicillins Swelling and Rash    Has patient had a PCN reaction causing immediate rash, facial/tongue/throat swelling, SOB or lightheadedness with hypotension: Yes Has patient had a PCN reaction causing severe rash involving mucus membranes or skin necrosis: No Has patient had a PCN reaction that required hospitalization: Yes Has patient had a PCN reaction occurring within the last 10 years: No If all of the above answers are "NO",  then may proceed with Cephalosporin use.    Current Outpatient Medications  Medication Sig Dispense Refill  . albuterol (PROVENTIL HFA;VENTOLIN HFA) 108 (90 Base) MCG/ACT inhaler Inhale 2 puffs into the lungs every 6 (six) hours as needed for wheezing or shortness of breath.    Marland Kitchen aspirin 81 MG tablet Take 81 mg by mouth daily as needed (chest pain).     . Azelastine-Fluticasone (DYMISTA) 137-50 MCG/ACT SUSP Use as directed 1 spray each side twice per day as needed 23 g 5  . Beclomethasone Dipropionate (QNASL) 80 MCG/ACT AERS Place 2 sprays into the nose daily. 1 Inhaler 1  . bictegravir-emtricitabine-tenofovir AF (BIKTARVY) 50-200-25 MG TABS tablet Take 1 tablet by mouth daily.    . blood glucose meter kit and supplies Dispense based on patient and insurance preference. Use up to four times daily as directed. (FOR ICD-10 E10.9, E11.9). 1 each 0  . Diclofenac Sodium (PENNSAID) 2 % SOLN Place 1 application onto the skin 2 (two) times daily. (Patient taking differently: Place 1 application onto the skin 2 (two) times daily as needed. ) 1 Bottle 3  . Dulaglutide (TRULICITY) 1.5 YY/5.0PT SOPN 0.5 ml weekly SQ 6 mL 3  . famotidine (PEPCID) 20 MG tablet Take 1 tablet (20 mg total) by mouth 2 (two) times daily. (Patient taking differently: Take 20 mg by mouth daily as needed for heartburn. ) 60 tablet 3  . ferrous gluconate (IRON 27) 240 (27 FE) MG tablet Take 240 mg by mouth daily as needed (iron).     Marland Kitchen glipiZIDE (GLIPIZIDE XL) 5 MG 24 hr tablet Take 1 tablet (5 mg total) by mouth daily with breakfast. 90 tablet 3  . ibuprofen (ADVIL,MOTRIN) 800 MG tablet Take 1 tablet (800 mg total) by mouth every 8 (eight) hours as needed. (Patient taking differently: Take 800 mg by mouth daily as needed for moderate pain. ) 60 tablet 0  . lidocaine-prilocaine (EMLA) cream Apply to affected area once (Patient taking differently: Apply 1 application topically once. Apply to affected area once) 30 g 3  . loratadine  (CLARITIN) 10 MG tablet Take 10 mg by mouth daily.    Marland Kitchen LORazepam (ATIVAN) 0.5 MG tablet Take 1 tablet (0.5 mg total) by mouth at bedtime as needed (Nausea or vomiting). 30 tablet 0  . metFORMIN (GLUCOPHAGE-XR) 500 MG 24 hr tablet Take 2 tablets (1,000 mg total) by mouth daily with breakfast. Annual appt is due must see provider for future refills 180 tablet 3  . Multiple Vitamin (MULTIVITAMIN WITH MINERALS) TABS tablet Take 1 tablet by  mouth daily.    Marland Kitchen omeprazole (PRILOSEC) 40 MG capsule Take 1 capsule (40 mg total) by mouth daily. 30 capsule 0  . ondansetron (ZOFRAN) 8 MG tablet Take 1 tablet (8 mg total) by mouth every 8 (eight) hours as needed for nausea or vomiting. 20 tablet 0  . oxyCODONE (OXY IR/ROXICODONE) 5 MG immediate release tablet Take 1-2 tablets (5-10 mg total) by mouth every 6 (six) hours as needed for moderate pain, severe pain or breakthrough pain. 30 tablet 0  . potassium chloride (K-DUR) 10 MEQ tablet Take 1 tablet (10 mEq total) by mouth daily. Take 40 meq BID x 2 days then 20 meq BID x 5 days 36 tablet 0  . prochlorperazine (COMPAZINE) 10 MG tablet Take 1 tablet (10 mg total) by mouth every 6 (six) hours as needed (Nausea or vomiting). 30 tablet 1  . valACYclovir (VALTREX) 500 MG tablet Take twice daily for 3-5 days 30 tablet 12  . zolpidem (AMBIEN) 10 MG tablet Take 1 tablet (10 mg total) by mouth at bedtime as needed for sleep. 90 tablet 1   No current facility-administered medications for this visit.    Facility-Administered Medications Ordered in Other Visits  Medication Dose Route Frequency Provider Last Rate Last Dose  . 0.9 %  sodium chloride infusion   Intravenous Once [name], [name], MD      . CARBOplatin (PARAPLATIN) 750 mg in sodium chloride 0.9 % 250 mL chemo infusion  750 mg Intravenous Once [name], [name], MD      . gemcitabine (GEMZAR) 1,824 mg in sodium chloride 0.9 % 250 mL chemo infusion  800 mg/m2 (Treatment Plan Recorded) Intravenous Once  [name], [name], MD      . heparin lock flush 100 unit/mL  500 Units Intracatheter Once PRN [name], [name], MD      . sodium chloride 0.9 % 1,000 mL with potassium chloride 10 mEq infusion   Intravenous Once [name], [name], MD      . sodium chloride flush (NS) 0.9 % injection 10 mL  10 mL Intracatheter PRN [name], [name], MD      . trastuzumab (HERCEPTIN) 714 mg in sodium chloride 0.9 % 250 mL chemo infusion  6 mg/kg (Treatment Plan Recorded) Intravenous Once Chauncey Cruel, MD 568 mL/hr at 05/10/18 1127 714 mg at 05/10/18 1127    OBJECTIVE:  See [facility] for vitals There were no vitals filed for this visit.   There is no height or weight on file to calculate BMI.   Wt Readings from Last 3 Encounters:  05/03/18 251 lb 6.4 oz (114 kg)  04/26/18 245 lb 1.6 oz (111.2 kg)  04/21/18 248 lb 6 oz (112.7 kg)   ECOG FS: 1 - Symptomatic but completely ambulatory  GENERAL: Patient is a well appearing female in no acute distress HEENT:  Sclerae anicteric.  Oropharynx clear and moist. No ulcerations or evidence of oropharyngeal candidiasis. Neck is supple.  NODES:  No cervical, supraclavicular, or axillary lymphadenopathy palpated.  BREAST EXAM:  Deferred. LUNGS:  Clear to auscultation bilaterally.  No wheezes or rhonchi. HEART:  Regular rate and rhythm. No murmur appreciated. ABDOMEN:  Soft, nontender.  Positive, normoactive bowel sounds. No organomegaly palpated. MSK:  No focal spinal tenderness to palpation. EXTREMITIES:  No peripheral edema.   SKIN:  Clear with no obvious rashes or skin changes. No nail dyscrasia. NEURO:  Nonfocal. Well oriented.  Appropriate affect.    LAB RESULTS:  CMP  Component Value Date/Time   NA 139 05/10/2018 0902   K 3.5 05/10/2018 0902   CL 100 05/10/2018 0902   CO2 27 05/10/2018 0902   GLUCOSE 276 (H) 05/10/2018 0902   BUN 8 05/10/2018 0902   CREATININE 0.82 05/10/2018 0902   CALCIUM 8.6 (L) 05/10/2018 0902   PROT 6.7 05/10/2018 0902    ALBUMIN 3.3 (L) 05/10/2018 0902   AST 25 05/10/2018 0902   ALT 25 05/10/2018 0902   ALKPHOS 174 (H) 05/10/2018 0902   BILITOT 0.4 05/10/2018 0902   GFRNONAA >60 05/10/2018 0902   GFRAA >60 05/10/2018 0902    No results found for: TOTALPROTELP, ALBUMINELP, A1GS, A2GS, BETS, BETA2SER, GAMS, MSPIKE, SPEI  No results found for: KPAFRELGTCHN, LAMBDASER, KAPLAMBRATIO  Lab Results  Component Value Date   WBC 4.4 05/10/2018   NEUTROABS 2.3 05/10/2018   HGB 9.1 (L) 05/10/2018   HCT 28.9 (L) 05/10/2018   MCV 90.0 05/10/2018   PLT 234 05/10/2018    _0 @  No results found for: LABCA2  No components found for: XBLTJQ300  No results for input(s): INR in the last 168 hours.  No results found for: LABCA2  No results found for: PQZ300  No results found for: TMA263  No results found for: FHL456  No results found for: CA2729  No components found for: HGQUANT  No results found for: CEA1 / No results found for: CEA1   No results found for: AFPTUMOR  No results found for: CHROMOGRNA  No results found for: PSA1  Appointment on 05/10/2018  Component Date Value Ref Range Status  . WBC Count 05/10/2018 4.4  4.0 - 10.5 K/uL Final  . RBC 05/10/2018 3.21* 3.87 - 5.11 MIL/uL Final  . Hemoglobin 05/10/2018 9.1* 12.0 - 15.0 g/dL Final  . HCT 05/10/2018 28.9* 36.0 - 46.0 % Final  . MCV 05/10/2018 90.0  80.0 - 100.0 fL Final  . MCH 05/10/2018 28.3  26.0 - 34.0 pg Final  . MCHC 05/10/2018 31.5  30.0 - 36.0 g/dL Final  . RDW 05/10/2018 19.0* 11.5 - 15.5 % Final  . Platelet Count 05/10/2018 234  150 - 400 K/uL Final  . nRBC 05/10/2018 0.0  0.0 - 0.2 % Final  . Neutrophils Relative % 05/10/2018 52  % Final  . Neutro Abs 05/10/2018 2.3  1.7 - 7.7 K/uL Final  . Lymphocytes Relative 05/10/2018 37  % Final  . Lymphs Abs 05/10/2018 1.6  0.7 - 4.0 K/uL Final  . Monocytes Relative 05/10/2018 9  % Final  . Monocytes Absolute 05/10/2018 0.4  0.1 - 1.0 K/uL Final  . Eosinophils  Relative 05/10/2018 1  % Final  . Eosinophils Absolute 05/10/2018 0.0  0.0 - 0.5 K/uL Final  . Basophils Relative 05/10/2018 0  % Final  . Basophils Absolute 05/10/2018 0.0  0.0 - 0.1 K/uL Final  . Immature Granulocytes 05/10/2018 1  % Final  . Abs Immature Granulocytes 05/10/2018 0.06  0.00 - 0.07 K/uL Final   Performed at [facility], [place] [address], [place], [place] [address]  . Sodium 05/10/2018 139  135 - 145 mmol/L Final  . Potassium 05/10/2018 3.5  3.5 - 5.1 mmol/L Final  . Chloride 05/10/2018 100  98 - 111 mmol/L Final  . CO2 05/10/2018 27  22 - 32 mmol/L Final  . Glucose, Bld 05/10/2018 276* 70 - 99 mg/dL Final  . BUN 05/10/2018 8  6 - 20 mg/dL Final  . Creatinine 05/10/2018 0.82  0.44 - 1.00  mg/dL Final  . Calcium 05/10/2018 8.6* 8.9 - 10.3 mg/dL Final  . Total Protein 05/10/2018 6.7  6.5 - 8.1 g/dL Final  . Albumin 05/10/2018 3.3* 3.5 - 5.0 g/dL Final  . AST 05/10/2018 25  15 - 41 U/L Final  . ALT 05/10/2018 25  0 - 44 U/L Final  . Alkaline Phosphatase 05/10/2018 174* 38 - 126 U/L Final  . Total Bilirubin 05/10/2018 0.4  0.3 - 1.2 mg/dL Final  . GFR, Est Non Af Am 05/10/2018 >60  >60 mL/min Final  . GFR, Est AFR Am 05/10/2018 >60  >60 mL/min Final   Comment: (NOTE) The eGFR has been calculated using the CKD EPI equation. This calculation has not been validated in all clinical situations. eGFR's persistently <60 mL/min signify possible Chronic Kidney Disease.   Georgiann Hahn gap 05/10/2018 12  5 - 15 Final   Performed at [facility], [address] [place], [place] [address]    (this displays the last labs from the last 3 days)  No results found for: TOTALPROTELP, ALBUMINELP, A1GS, A2GS, BETS, BETA2SER, GAMS, MSPIKE, SPEI (this displays SPEP labs)  No results found for: KPAFRELGTCHN, LAMBDASER, KAPLAMBRATIO (kappa/lambda light chains)  No results found for: HGBA, HGBA2QUANT, HGBFQUANT, HGBSQUAN (Hemoglobinopathy  evaluation)   No results found for: LDH  Lab Results  Component Value Date   IRON 35 (L) 01/30/2010   IRONPCTSAT 8.8 (L) 01/30/2010   (Iron and TIBC)  No results found for: FERRITIN  Urinalysis    Component Value Date/Time   COLORURINE RED (A) 04/01/2018 0202   APPEARANCEUR HAZY (A) 04/01/2018 0202   LABSPEC 1.021 04/01/2018 0202   PHURINE 6.0 04/01/2018 0202   GLUCOSEU >=500 (A) 04/01/2018 0202   GLUCOSEU NEGATIVE 09/27/2017 1002   HGBUR LARGE (A) 04/01/2018 0202   BILIRUBINUR NEGATIVE 04/01/2018 0202   KETONESUR 5 (A) 04/01/2018 0202   PROTEINUR 100 (A) 04/01/2018 0202   UROBILINOGEN 0.2 09/27/2017 1002   NITRITE NEGATIVE 04/01/2018 0202   LEUKOCYTESUR NEGATIVE 04/01/2018 0202     STUDIES: Dg Chest Port 1 View  Result Date: 04/21/2018 CLINICAL DATA:  Status post Port-A-Cath placement. EXAM: PORTABLE CHEST 1 VIEW COMPARISON:  04/07/2018 FINDINGS: New left anterior chest wall Port-A-Cath extends through the subclavian vein. Tip projects in the right atrium. No pneumothorax.  No acute findings in the lungs. Cardiac silhouette is normal in size. No mediastinal or hilar masses. Stable changes from prior right breast surgery. IMPRESSION: 1. Left anterior chest wall Port-A-Cath. Tip projects in the right atrium. 2. No pneumothorax.  No acute cardiopulmonary disease. Electronically Signed   By: Lajean Manes M.D.   On: 04/21/2018 11:17   Dg C-arm 1-60 Min-no Report  Result Date: 04/21/2018 Fluoroscopy was utilized by the requesting physician.  No radiographic interpretation.    ELIGIBLE FOR AVAILABLE RESEARCH PROTOCOL: no  ASSESSMENT: 47 y.o. [place], [name] woman status post right breast upper outer quadrant biopsy 01/11/2018 for a clinical T1 N0, stage IA invasive ductal carcinoma, grade 3, estrogen and progesterone receptor positive, HER-2 amplified, with an MIB-1 of 30%.  (1) status post right lumpectomy and sentinel lymph node sampling 02/23/2018 for a pT2 pN1, stage IB  invasive ductal carcinoma, grade 3, with close but negative margins  (2) adjuvant chemotherapy consisting of carboplatin, docetaxel, trastuzumab and Pertuzumab every 21 days x 6 started 03/08/2018  (a) pertuzumab stopped after cycle 1 due to diarrhea  (b) docetaxel changed to gemcitabine after cycle 2 due to persistent hyperglycemia  (3)  anti-HER-2 immunotherapy to start concurrently with chemotherapy  (a) baseline echocardiogram on 02/07/2018 showed an ejection fraction in the 55-60% range  (4) adjuvant radiation to follow  (5) antiestrogens to follow at the completion of local treatment  (6) genetics testing through [facility] Multi-cancer and Breast panel on 01/13/2018 showed: no deleterious mutations. The following genes were evaluated for sequence changes and exonic deletions/duplications: ALK, APC, ATM, AXIN2, BAP1, BARD1, BLM, BMPR1A, BRCA1, BRCA2, BRIP1, CASR, CDC73, CDH1, CDK4, CDKN1B, CDKN1C,CDKN2A (p14ARF), CDKN2A (p16INK4a), CEBPA, CHEK2, CTNNA1, DICER1, DIS3L2, EPCAM*, FH, FLCN, GATA2, GPC3, GREM1*, HRAS, KIT, MAX, MEN1, MET, MLH1, MSH2, MSH3, MSH6, MUTYH, NBN, NF1, NF2, PALB2, PDGFRA, PHOX2B*, PMS2, POLD1,POLE, POT1, PRKAR1A, PTCH1, PTEN, RAD50, RAD51C, RAD51D, RB1, RECQL4, RET, RUNX1, SDHAF2, SDHB, SDHC, SDHD,SMAD4, SMARCA4, SMARCB1, SMARCE1, STK11, SUFU, TERC, TERT, TMEM127, TP53, TSC1, TSC2, VHL, WRN*, WT1.The following genes were evaluated for sequence changes only: EGFR*, HOXB13*, MITF*, NTHL1*, SDHA  PLAN: Intisar is doing well today.  She is doing well today and her labs are stable. She will proceed with her fourth cycle of adjuvant Gemcitabine, Carboplatin and Trastuzumab.  She is tolerating this well.  I noted her blood sugar is slightly elevated and reviewed that with her.  She is continuing her diabetes medications.   Genna and I reviewed her shortness of breath which is likely related to deconditioning.  We reviewed that she should work to be more active during times  that she is not as fatigued as others.  Should her shortness of breath continue, or worsen, we will evaluate her need for an expedited echocardiogram.  Her lung exam is normal, she has no swelling (though does have some weight gain since she isn't experiencing any more diarrhea), and is otherwise feeling well.    Kimala is not experiencing diarrhea, but knows what to do if she is.  We reviewed imodium, and she knows to call if she is having any loose stools so we can try to manage them so her potassium wont decrease again.  Zaynah will return next week for labs and f/u.  She knows to call for any questions or concerns prior to her next appointment with [place].    A total of (20) minutes of face-to-face time was spent with this patient with greater than 50% of that time in counseling and care-coordination.   Wilber Bihari, NP 05/10/18 11:50 AM Medical Oncology and Hematology [facility] [address] [place], [place] [address] Tel. [phone]    Fax. [phone]

## 2018-05-12 ENCOUNTER — Inpatient Hospital Stay: Payer: BC Managed Care – PPO

## 2018-05-12 DIAGNOSIS — C50411 Malignant neoplasm of upper-outer quadrant of right female breast: Secondary | ICD-10-CM | POA: Diagnosis not present

## 2018-05-12 DIAGNOSIS — Z170 Estrogen receptor positive status [ER+]: Secondary | ICD-10-CM

## 2018-05-12 MED ORDER — PEGFILGRASTIM-CBQV 6 MG/0.6ML ~~LOC~~ SOSY
6.0000 mg | PREFILLED_SYRINGE | Freq: Once | SUBCUTANEOUS | Status: AC
  Administered 2018-05-12: 6 mg via SUBCUTANEOUS

## 2018-05-12 MED ORDER — PEGFILGRASTIM-CBQV 6 MG/0.6ML ~~LOC~~ SOSY
PREFILLED_SYRINGE | SUBCUTANEOUS | Status: AC
  Filled 2018-05-12: qty 0.6

## 2018-05-12 NOTE — Patient Instructions (Signed)
Pegfilgrastim injection What is this medicine? PEGFILGRASTIM (PEG fil gra stim) is a long-acting granulocyte colony-stimulating factor that stimulates the growth of neutrophils, a type of white blood cell important in the body's fight against infection. It is used to reduce the incidence of fever and infection in patients with certain types of cancer who are receiving chemotherapy that affects the bone marrow, and to increase survival after being exposed to high doses of radiation. This medicine may be used for other purposes; ask your health care provider or pharmacist if you have questions. COMMON BRAND NAME(S): Neulasta What should I tell my health care provider before I take this medicine? They need to know if you have any of these conditions: -kidney disease -latex allergy -ongoing radiation therapy -sickle cell disease -skin reactions to acrylic adhesives (On-Body Injector only) -an unusual or allergic reaction to pegfilgrastim, filgrastim, other medicines, foods, dyes, or preservatives -pregnant or trying to get pregnant -breast-feeding How should I use this medicine? This medicine is for injection under the skin. If you get this medicine at home, you will be taught how to prepare and give the pre-filled syringe or how to use the On-body Injector. Refer to the patient Instructions for Use for detailed instructions. Use exactly as directed. Tell your healthcare provider immediately if you suspect that the On-body Injector may not have performed as intended or if you suspect the use of the On-body Injector resulted in a missed or partial dose. It is important that you put your used needles and syringes in a special sharps container. Do not put them in a trash can. If you do not have a sharps container, call your pharmacist or healthcare provider to get one. Talk to your pediatrician regarding the use of this medicine in children. While this drug may be prescribed for selected conditions,  precautions do apply. Overdosage: If you think you have taken too much of this medicine contact a poison control center or emergency room at once. NOTE: This medicine is only for you. Do not share this medicine with others. What if I miss a dose? It is important not to miss your dose. Call your doctor or health care professional if you miss your dose. If you miss a dose due to an On-body Injector failure or leakage, a new dose should be administered as soon as possible using a single prefilled syringe for manual use. What may interact with this medicine? Interactions have not been studied. Give your health care provider a list of all the medicines, herbs, non-prescription drugs, or dietary supplements you use. Also tell them if you smoke, drink alcohol, or use illegal drugs. Some items may interact with your medicine. This list may not describe all possible interactions. Give your health care provider a list of all the medicines, herbs, non-prescription drugs, or dietary supplements you use. Also tell them if you smoke, drink alcohol, or use illegal drugs. Some items may interact with your medicine. What should I watch for while using this medicine? You may need blood work done while you are taking this medicine. If you are going to need a MRI, CT scan, or other procedure, tell your doctor that you are using this medicine (On-Body Injector only). What side effects may I notice from receiving this medicine? Side effects that you should report to your doctor or health care professional as soon as possible: -allergic reactions like skin rash, itching or hives, swelling of the face, lips, or tongue -dizziness -fever -pain, redness, or irritation at site   where injected -pinpoint red spots on the skin -red or dark-brown urine -shortness of breath or breathing problems -stomach or side pain, or pain at the shoulder -swelling -tiredness -trouble passing urine or change in the amount of urine Side  effects that usually do not require medical attention (report to your doctor or health care professional if they continue or are bothersome): -bone pain -muscle pain This list may not describe all possible side effects. Call your doctor for medical advice about side effects. You may report side effects to FDA at 1-800-FDA-1088. Where should I keep my medicine? Keep out of the reach of children. Store pre-filled syringes in a refrigerator between 2 and 8 degrees C (36 and 46 degrees F). Do not freeze. Keep in carton to protect from light. Throw away this medicine if it is left out of the refrigerator for more than 48 hours. Throw away any unused medicine after the expiration date. NOTE: This sheet is a summary. It may not cover all possible information. If you have questions about this medicine, talk to your doctor, pharmacist, or health care provider.  2018 Elsevier/Gold Standard (2016-07-08 12:58:03)  

## 2018-05-16 ENCOUNTER — Ambulatory Visit: Payer: BC Managed Care – PPO | Admitting: Gastroenterology

## 2018-05-16 ENCOUNTER — Encounter: Payer: Self-pay | Admitting: Gastroenterology

## 2018-05-16 ENCOUNTER — Telehealth: Payer: Self-pay | Admitting: *Deleted

## 2018-05-16 ENCOUNTER — Telehealth: Payer: Self-pay | Admitting: Emergency Medicine

## 2018-05-16 VITALS — BP 144/78 | HR 85 | Ht 62.0 in | Wt 245.8 lb

## 2018-05-16 DIAGNOSIS — K921 Melena: Secondary | ICD-10-CM

## 2018-05-16 NOTE — Telephone Encounter (Signed)
Hi Mendel Ryder,   Just an FYI Ms. Breece is scheduled for a colonoscopy on 07-06-18 with Dr. Thornton Park. It looks like she will complete her chemotherapy on 06-28-18. Not sure when she will begin radiation but wanted to coordinate the timing of the colonoscopy with you to make sure you think patient is stable.   Thanks,   Tinnie Gens, [place]

## 2018-05-16 NOTE — Patient Instructions (Addendum)
You have been scheduled for a colonoscopy. Please follow written instructions given to you at your visit today.  Please pick up your prep supplies at the pharmacy within the next 1-3 days. If you use inhalers (even only as needed), please bring them with you on the day of your procedure. Your physician has requested that you go to www.[email or web address].com and enter the access code given to you at your visit today. This web site gives a general overview about your procedure. However, you should still follow specific instructions given to you by our office regarding your preparation for the procedure.  Drink at least 72 ounces of water daily.   Use a daily bulking agent such as Metamucil and add a stool softener if needed.

## 2018-05-16 NOTE — Telephone Encounter (Signed)
Received vm call from pt requesting call back regarding disability forms.  Returned call & she states that she gave work disability forms to [employer] would like those dated for Nov 1.  She also states that SS disability has been trying to reach [place].  Message left with Debria Garret.

## 2018-05-16 NOTE — Progress Notes (Signed)
Referring Provider: Biagio Borg, MD Primary Care Physician:  Biagio Borg, MD   Reason for Consultation:  Recent diarrhea and hematochezia   IMPRESSION:  Hematochezia, diarrhea while on chemotherapy, now resolved Chemotherapy-induced thrombocytopenia, now resolved Mild constipation related to chemotherapy Family history of colon cancer (father at age 49) No prior colon cancer screening  Colonoscopy recommended to evaluate for the source of her hematochezia. Agreed to perform a full rectal exam under sedation. Will want to time the exam around her chemotherapy to avoid any thrombocytopenia.   Reviewed management options for medication-related constipation. Will consider additional evaluation and treatment if the constipation persists despite adequate hydration and the addition of a daily stool bulking agent.   PLAN: - Colonoscopy when oncology approves the timing given her chemotherapy schedule and transient thrombocytopenia - Drink at [place] 72 hours of water daily, use a daily bulking agent such as Metamucil, add the stool softener as needed   HPI: Kelly Rowe is a 47 y.o. female referred for diarrhea and hematochezia.  History is obtained through the patient and review of her electronic health record.    She was diagnosed with triple positive breast cancer in June 2019.  She has been receiving adjuvant chemotherapy since August.  Treatment with her pertuzumab had to be discontinued after cycle 1 due to diarrhea.  She was having 8-10 bloody bowel movements.  These were primarily postprandial and described as explosive and watery. No rectal pain, tear, or hard stools. No other associated symptoms.  No identified exacerbating or relieving features  Preparation H, a topical lidocaine, and Imodium provided no relief.  She was only able to improve her symptoms by minimizing food intake.  Thankfully with the adjustment in her chemotherapy regimen, she has had no further diarrhea or  bleeding in the last month.    Chemotherapy has been associated with thrombocytopenia (nadir 60,000 05/03/18). Although most recent platelet count was 234,000.   There is no prior history of GI bleeding.  No prior endoscopic evaluation.  She has heartburn that is well controlled on omeprazole.  She has intermittent constipation that responds to a stool softener as needed.  There is no dysphagia, odynophagia, regurgitation, nausea, abdominal pain, or melena.  Her father was diagnosed with colon cancer at age 49 but ultimately died of lung cancer there is no other known family history of colon cancer or colon polyps.   Past Medical History:  Diagnosis Date  . ANEMIA-IRON DEFICIENCY 01/30/2010  . ANXIETY 11/27/2007  . Arthritis   . Asthma 02/25/2011  . Cancer ([place])    breast  . Carbuncle and furuncle of trunk 04/16/2010  . Chlamydia infection 03/21/2008  . DIABETES MELLITUS, TYPE II 08/02/2007  . Edema 01/30/2010  . ELEVATED BLOOD PRESSURE WITHOUT DIAGNOSIS OF HYPERTENSION 08/02/2007  . Family history of breast cancer   . Family history of colon cancer   . Family history of lung cancer   . FREQUENCY, URINARY 05/19/2009  . GENITAL HERPES 03/12/2009  . GERD (gastroesophageal reflux disease)   . History of kidney stones   . HIV (human immunodeficiency virus infection) ([place]) 2009  . HIV INFECTION 03/12/2009  . HSV (herpes simplex virus) infection   . HYPERLIPIDEMIA 08/02/2007  . Hypertension   . Metrorrhagia 03/21/2008  . PONV (postoperative nausea and vomiting)    woke up crying per patient   . RETENTION, URINE 05/19/2009  . Trichomonas infection 01/19/2010  . VITAMIN D DEFICIENCY 01/30/2010    Past Surgical History:  Procedure Laterality Date  . ABDOMINAL HYSTERECTOMY  07/22/2010   TAH WITH PRESERVATION OF BOTH TUBES AND OVARIES  . BREAST LUMPECTOMY WITH RADIOACTIVE SEED AND SENTINEL LYMPH NODE BIOPSY Right 02/23/2018   Procedure: BREAST LUMPECTOMY WITH RADIOACTIVE SEED AND SENTINEL LYMPH NODE  BIOPSY;  Surgeon: Stark Klein, MD;  Location: [facility];  Service: General;  Laterality: Right;  . CHOLECYSTECTOMY    . ENDOMETRIAL ABLATION  01/11/2008   HER OPTION  . ESOPHAGOGASTRODUODENOSCOPY    . MULTIPLE TOOTH EXTRACTIONS    . PORTACATH PLACEMENT Left 02/23/2018   Procedure: INSERTION PORT-A-CATH;  Surgeon: Stark Klein, MD;  Location: [facility];  Service: General;  Laterality: Left;  . PORTACATH PLACEMENT N/A 04/21/2018   Procedure: PORT-A-CATH REVISION;  Surgeon: Stark Klein, MD;  Location: [facility];  Service: General;  Laterality: N/A;  . TUBAL LIGATION    . WISDOM TOOTH EXTRACTION      Prior to Admission medications   Medication Sig Start Date End Date Taking? Authorizing Provider  albuterol (PROVENTIL HFA;VENTOLIN HFA) 108 (90 Base) MCG/ACT inhaler Inhale 2 puffs into the lungs every 6 (six) hours as needed for wheezing or shortness of breath.   Yes [provider]  aspirin 81 MG tablet Take 81 mg by mouth daily as needed (chest pain).    Yes [provider]  Azelastine-Fluticasone (DYMISTA) 137-50 MCG/ACT SUSP Use as directed 1 spray each side twice per day as needed 09/15/17  Yes Biagio Borg, MD  Beclomethasone Dipropionate (QNASL) 80 MCG/ACT AERS Place 2 sprays into the nose daily. 09/08/17  Yes Hoyt Koch, MD  bictegravir-emtricitabine-tenofovir AF (BIKTARVY) 50-200-25 MG TABS tablet Take 1 tablet by mouth daily.   Yes [provider]  blood glucose meter kit and supplies Dispense based on patient and insurance preference. Use up to four times daily as directed. (FOR ICD-10 E10.9, E11.9). 09/27/17  Yes Biagio Borg, MD  Diclofenac Sodium (PENNSAID) 2 % SOLN Place 1 application onto the skin 2 (two) times daily. Patient taking differently: Place 1 application onto the skin 2 (two) times daily as needed.  11/30/17  Yes Rosemarie Ax, MD  Dulaglutide (TRULICITY) 1.5 EP/3.2RJ SOPN 0.5 ml weekly SQ 04/10/18  Yes Biagio Borg, MD  famotidine (PEPCID)  20 MG tablet Take 1 tablet (20 mg total) by mouth 2 (two) times daily. Patient taking differently: Take 20 mg by mouth daily as needed for heartburn.  03/13/18  Yes [name], [name], MD  ferrous gluconate (IRON 27) 240 (27 FE) MG tablet Take 240 mg by mouth daily as needed (iron).    Yes [provider]  glipiZIDE (GLIPIZIDE XL) 5 MG 24 hr tablet Take 1 tablet (5 mg total) by mouth daily with breakfast. 09/27/17  Yes Biagio Borg, MD  ibuprofen (ADVIL,MOTRIN) 800 MG tablet Take 1 tablet (800 mg total) by mouth every 8 (eight) hours as needed. Patient taking differently: Take 800 mg by mouth daily as needed for moderate pain.  11/28/17  Yes Biagio Borg, MD  lidocaine-prilocaine (EMLA) cream Apply to affected area once Patient taking differently: Apply 1 application topically once. Apply to affected area once 03/03/18  Yes [name], [name], MD  loratadine (CLARITIN) 10 MG tablet Take 10 mg by mouth daily.   Yes [provider]  LORazepam (ATIVAN) 0.5 MG tablet Take 1 tablet (0.5 mg total) by mouth at bedtime as needed (Nausea or vomiting). 03/03/18  Yes [name], [name], MD  metFORMIN (GLUCOPHAGE-XR) 500 MG 24  hr tablet Take 2 tablets (1,000 mg total) by mouth daily with breakfast. Annual appt is due must see provider for future refills 09/27/17  Yes Biagio Borg, MD  Multiple Vitamin (MULTIVITAMIN WITH MINERALS) TABS tablet Take 1 tablet by mouth daily.   Yes [provider]  omeprazole (PRILOSEC) 40 MG capsule Take 1 capsule (40 mg total) by mouth daily. 03/15/18  Yes Causey, Charlestine Massed, NP  ondansetron (ZOFRAN) 8 MG tablet Take 1 tablet (8 mg total) by mouth every 8 (eight) hours as needed for nausea or vomiting. 03/15/18  Yes Causey, Charlestine Massed, NP  oxyCODONE (OXY IR/ROXICODONE) 5 MG immediate release tablet Take 1-2 tablets (5-10 mg total) by mouth every 6 (six) hours as needed for moderate pain, severe pain or breakthrough pain. 04/21/18  Yes Stark Klein, MD    potassium chloride (K-DUR) 10 MEQ tablet Take 1 tablet (10 mEq total) by mouth daily. Take 40 meq BID x 2 days then 20 meq BID x 5 days 04/26/18  Yes Causey, Charlestine Massed, NP  prochlorperazine (COMPAZINE) 10 MG tablet Take 1 tablet (10 mg total) by mouth every 6 (six) hours as needed (Nausea or vomiting). 03/03/18  Yes [name], [name], MD  valACYclovir (VALTREX) 500 MG tablet Take twice daily for 3-5 days 06/08/17  Yes [name], NP  zolpidem (AMBIEN) 10 MG tablet Take 1 tablet (10 mg total) by mouth at bedtime as needed for sleep. 03/30/18  Yes Biagio Borg, MD    Current Outpatient Medications  Medication Sig Dispense Refill  . albuterol (PROVENTIL HFA;VENTOLIN HFA) 108 (90 Base) MCG/ACT inhaler Inhale 2 puffs into the lungs every 6 (six) hours as needed for wheezing or shortness of breath.    Marland Kitchen aspirin 81 MG tablet Take 81 mg by mouth daily as needed (chest pain).     . Azelastine-Fluticasone (DYMISTA) 137-50 MCG/ACT SUSP Use as directed 1 spray each side twice per day as needed 23 g 5  . Beclomethasone Dipropionate (QNASL) 80 MCG/ACT AERS Place 2 sprays into the nose daily. 1 Inhaler 1  . bictegravir-emtricitabine-tenofovir AF (BIKTARVY) 50-200-25 MG TABS tablet Take 1 tablet by mouth daily.    . blood glucose meter kit and supplies Dispense based on patient and insurance preference. Use up to four times daily as directed. (FOR ICD-10 E10.9, E11.9). 1 each 0  . Diclofenac Sodium (PENNSAID) 2 % SOLN Place 1 application onto the skin 2 (two) times daily. (Patient taking differently: Place 1 application onto the skin 2 (two) times daily as needed. ) 1 Bottle 3  . Dulaglutide (TRULICITY) 1.5 ZW/2.5EN SOPN 0.5 ml weekly SQ 6 mL 3  . famotidine (PEPCID) 20 MG tablet Take 1 tablet (20 mg total) by mouth 2 (two) times daily. (Patient taking differently: Take 20 mg by mouth daily as needed for heartburn. ) 60 tablet 3  . ferrous gluconate (IRON 27) 240 (27 FE) MG tablet Take 240 mg by mouth  daily as needed (iron).     Marland Kitchen glipiZIDE (GLIPIZIDE XL) 5 MG 24 hr tablet Take 1 tablet (5 mg total) by mouth daily with breakfast. 90 tablet 3  . ibuprofen (ADVIL,MOTRIN) 800 MG tablet Take 1 tablet (800 mg total) by mouth every 8 (eight) hours as needed. (Patient taking differently: Take 800 mg by mouth daily as needed for moderate pain. ) 60 tablet 0  . lidocaine-prilocaine (EMLA) cream Apply to affected area once (Patient taking differently: Apply 1 application topically once. Apply to affected area once)  30 g 3  . loratadine (CLARITIN) 10 MG tablet Take 10 mg by mouth daily.    Marland Kitchen LORazepam (ATIVAN) 0.5 MG tablet Take 1 tablet (0.5 mg total) by mouth at bedtime as needed (Nausea or vomiting). 30 tablet 0  . metFORMIN (GLUCOPHAGE-XR) 500 MG 24 hr tablet Take 2 tablets (1,000 mg total) by mouth daily with breakfast. Annual appt is due must see provider for future refills 180 tablet 3  . Multiple Vitamin (MULTIVITAMIN WITH MINERALS) TABS tablet Take 1 tablet by mouth daily.    Marland Kitchen omeprazole (PRILOSEC) 40 MG capsule Take 1 capsule (40 mg total) by mouth daily. 30 capsule 0  . ondansetron (ZOFRAN) 8 MG tablet Take 1 tablet (8 mg total) by mouth every 8 (eight) hours as needed for nausea or vomiting. 20 tablet 0  . oxyCODONE (OXY IR/ROXICODONE) 5 MG immediate release tablet Take 1-2 tablets (5-10 mg total) by mouth every 6 (six) hours as needed for moderate pain, severe pain or breakthrough pain. 30 tablet 0  . potassium chloride (K-DUR) 10 MEQ tablet Take 1 tablet (10 mEq total) by mouth daily. Take 40 meq BID x 2 days then 20 meq BID x 5 days 36 tablet 0  . prochlorperazine (COMPAZINE) 10 MG tablet Take 1 tablet (10 mg total) by mouth every 6 (six) hours as needed (Nausea or vomiting). 30 tablet 1  . valACYclovir (VALTREX) 500 MG tablet Take twice daily for 3-5 days 30 tablet 12  . zolpidem (AMBIEN) 10 MG tablet Take 1 tablet (10 mg total) by mouth at bedtime as needed for sleep. 90 tablet 1   No  current facility-administered medications for this visit.    Facility-Administered Medications Ordered in Other Visits  Medication Dose Route Frequency Provider Last Rate Last Dose  . 0.9 %  sodium chloride infusion   Intravenous Once [name], [name], MD      . sodium chloride 0.9 % 1,000 mL with potassium chloride 10 mEq infusion   Intravenous Once [name], [name], MD        Allergies as of 05/16/2018 - Review Complete 05/16/2018  Allergen Reaction Noted  . Levaquin [levofloxacin in d5w] Nausea And Vomiting and Rash 08/17/2013  . Penicillins Swelling and Rash 08/02/2007    Family History  Problem Relation Age of Onset  . Prostate cancer Other   . Heart disease Other   . Stroke Other   . Diabetes Mother   . Hypertension Mother   . Diabetes Father   . Cancer Father        COLON and LU NG  . Stroke Paternal Uncle   . Breast cancer Paternal Aunt   . Lung cancer Maternal Grandmother 55       Mesothelioma  . Colon cancer Paternal Grandfather        dx over 10s  . Lung cancer Paternal Aunt   . Breast cancer Paternal Aunt   . Breast cancer Paternal Aunt   . Heart attack Maternal Grandfather 71  . Colon cancer Other        MGM's 5 brothers    Social History   Socioeconomic History  . Marital status: Divorced    Spouse name: Not on file  . Number of children: Not on file  . Years of education: Not on file  . Highest education level: Not on file  Occupational History  . Not on file  Social Needs  . Financial resource strain: Not on file  . Food insecurity:  Worry: Not on file    Inability: Not on file  . Transportation needs:    Medical: Not on file    Non-medical: Not on file  Tobacco Use  . Smoking status: Never Smoker  . Smokeless tobacco: Never Used  Substance and Sexual Activity  . Alcohol use: Never    Alcohol/week: 0.0 standard drinks    Frequency: Never  . Drug use: No  . Sexual activity: Yes    Birth control/protection: Surgical    Comment: HAS  HAD A HYSTERECTOMY  Lifestyle  . Physical activity:    Days per week: Not on file    Minutes per session: Not on file  . Stress: Not on file  Relationships  . Social connections:    Talks on phone: Not on file    Gets together: Not on file    Attends religious service: Not on file    Active member of club or organization: Not on file    Attends meetings of clubs or organizations: Not on file    Relationship status: Not on file  . Intimate partner violence:    Fear of current or ex partner: Not on file    Emotionally abused: Not on file    Physically abused: Not on file    Forced sexual activity: Not on file  Other Topics Concern  . Not on file  Social History Narrative  . Not on file    Review of Systems: 12 system ROS is negative except as noted above except for sinus trouble, vision changes, depression, headaches, night sweats, insomnia.   Physical Exam:   General:   Alert, well-nourished, pleasant and cooperative in NAD Head:  Normocephalic and atraumatic. Eyes:  Sclera clear, no icterus.   Conjunctiva pink. Mouth:  No deformity or lesions.   Neck:  Supple; no thyromegaly. Lungs:  Clear throughout to auscultation.   No wheezes.  Heart:  Regular rate and rhythm; no murmurs Abdomen:  Soft, nontender, normal bowel sounds. No rebound or guarding. No hepatosplenomegaly Rectal:  Deferred  Msk:  Symmetrical without gross deformities. Extremities:  No gross deformities or edema. Neurologic:  Alert and  oriented x4;  grossly nonfocal Skin:  No rash or bruise. Psych:  Alert and cooperative. Normal mood and affect.    Caldonia Leap L. Tarri Glenn Md, MPH [facility] 05/16/2018, 1:31 PM

## 2018-05-17 ENCOUNTER — Other Ambulatory Visit: Payer: Self-pay | Admitting: Adult Health

## 2018-05-17 ENCOUNTER — Inpatient Hospital Stay (HOSPITAL_BASED_OUTPATIENT_CLINIC_OR_DEPARTMENT_OTHER): Payer: BC Managed Care – PPO | Admitting: Adult Health

## 2018-05-17 ENCOUNTER — Encounter: Payer: Self-pay | Admitting: Adult Health

## 2018-05-17 ENCOUNTER — Telehealth: Payer: Self-pay

## 2018-05-17 ENCOUNTER — Inpatient Hospital Stay: Payer: BC Managed Care – PPO

## 2018-05-17 ENCOUNTER — Encounter: Payer: Self-pay | Admitting: Internal Medicine

## 2018-05-17 VITALS — BP 143/77 | HR 93 | Temp 98.0°F | Resp 18 | Ht 62.0 in | Wt 247.5 lb

## 2018-05-17 DIAGNOSIS — C773 Secondary and unspecified malignant neoplasm of axilla and upper limb lymph nodes: Secondary | ICD-10-CM

## 2018-05-17 DIAGNOSIS — C50411 Malignant neoplasm of upper-outer quadrant of right female breast: Secondary | ICD-10-CM

## 2018-05-17 DIAGNOSIS — F419 Anxiety disorder, unspecified: Secondary | ICD-10-CM

## 2018-05-17 DIAGNOSIS — C50911 Malignant neoplasm of unspecified site of right female breast: Secondary | ICD-10-CM

## 2018-05-17 DIAGNOSIS — Z170 Estrogen receptor positive status [ER+]: Secondary | ICD-10-CM

## 2018-05-17 DIAGNOSIS — E876 Hypokalemia: Secondary | ICD-10-CM

## 2018-05-17 DIAGNOSIS — I10 Essential (primary) hypertension: Secondary | ICD-10-CM | POA: Diagnosis not present

## 2018-05-17 DIAGNOSIS — E119 Type 2 diabetes mellitus without complications: Secondary | ICD-10-CM | POA: Diagnosis not present

## 2018-05-17 DIAGNOSIS — B20 Human immunodeficiency virus [HIV] disease: Secondary | ICD-10-CM

## 2018-05-17 DIAGNOSIS — Z95828 Presence of other vascular implants and grafts: Secondary | ICD-10-CM

## 2018-05-17 LAB — CBC WITH DIFFERENTIAL (CANCER CENTER ONLY)
Abs Immature Granulocytes: 0.02 10*3/uL (ref 0.00–0.07)
Basophils Absolute: 0 10*3/uL (ref 0.0–0.1)
Basophils Relative: 0 %
Eosinophils Absolute: 0 10*3/uL (ref 0.0–0.5)
Eosinophils Relative: 0 %
HCT: 26.7 % — ABNORMAL LOW (ref 36.0–46.0)
Hemoglobin: 8.7 g/dL — ABNORMAL LOW (ref 12.0–15.0)
Immature Granulocytes: 1 %
Lymphocytes Relative: 51 %
Lymphs Abs: 1.3 10*3/uL (ref 0.7–4.0)
MCH: 28.9 pg (ref 26.0–34.0)
MCHC: 32.6 g/dL (ref 30.0–36.0)
MCV: 88.7 fL (ref 80.0–100.0)
Monocytes Absolute: 0.2 10*3/uL (ref 0.1–1.0)
Monocytes Relative: 7 %
Neutro Abs: 1 10*3/uL — ABNORMAL LOW (ref 1.7–7.7)
Neutrophils Relative %: 41 %
Platelet Count: 265 10*3/uL (ref 150–400)
RBC: 3.01 MIL/uL — ABNORMAL LOW (ref 3.87–5.11)
RDW: 17.3 % — ABNORMAL HIGH (ref 11.5–15.5)
WBC Count: 2.5 10*3/uL — ABNORMAL LOW (ref 4.0–10.5)
nRBC: 0 % (ref 0.0–0.2)

## 2018-05-17 LAB — CMP (CANCER CENTER ONLY)
ALT: 50 U/L — ABNORMAL HIGH (ref 0–44)
AST: 41 U/L (ref 15–41)
Albumin: 3.4 g/dL — ABNORMAL LOW (ref 3.5–5.0)
Alkaline Phosphatase: 170 U/L — ABNORMAL HIGH (ref 38–126)
Anion gap: 12 (ref 5–15)
BUN: 9 mg/dL (ref 6–20)
CO2: 28 mmol/L (ref 22–32)
Calcium: 9.1 mg/dL (ref 8.9–10.3)
Chloride: 99 mmol/L (ref 98–111)
Creatinine: 0.76 mg/dL (ref 0.44–1.00)
GFR, Est AFR Am: >60 mL/min (ref >60)
GFR, Estimated: >60 mL/min (ref >60)
Glucose, Bld: 197 mg/dL — ABNORMAL HIGH (ref 70–99)
Potassium: 3 mmol/L — CL (ref 3.5–5.1)
Sodium: 139 mmol/L (ref 135–145)
Total Bilirubin: 0.3 mg/dL (ref 0.3–1.2)
Total Protein: 7 g/dL (ref 6.5–8.1)

## 2018-05-17 MED ORDER — HEPARIN SOD (PORK) LOCK FLUSH 100 UNIT/ML IV SOLN
500.0000 [IU] | Freq: Once | INTRAVENOUS | Status: AC
  Administered 2018-05-17: 500 [IU]
  Filled 2018-05-17: qty 5

## 2018-05-17 MED ORDER — POTASSIUM CHLORIDE CRYS ER 20 MEQ PO TBCR: 20.0000 meq | EXTENDED_RELEASE_TABLET | Freq: Every day | ORAL | 0 refills | Status: DC

## 2018-05-17 MED ORDER — SODIUM CHLORIDE 0.9% FLUSH
10.0000 mL | Freq: Once | INTRAVENOUS | Status: AC
  Administered 2018-05-17: 10 mL
  Filled 2018-05-17: qty 10

## 2018-05-17 NOTE — Progress Notes (Signed)
FMLA successfully faxed to Doristine Johns at [facility] at [phone]. Mailed copy to patient address on file.

## 2018-05-17 NOTE — Progress Notes (Signed)
[id number]

## 2018-05-17 NOTE — Progress Notes (Signed)
Kelly Rowe  [phone] [phone]) [phone]     ID: [name] DOB: Jun 26, 1971  MR#: [id number]  VJD#:[id number]  Patient Care Team: Biagio Borg, MD as PCP - [name], MD as Consulting Physician (General Surgery) [name], [name], MD as Consulting Physician (Oncology) [name], MD as Attending Physician (Radiation Oncology) [name], NP as Nurse Practitioner (Obstetrics and Gynecology) Marlinda Mike, PA-C as Referring Physician (Physician Assistant) OTHER MD:   CHIEF COMPLAINT: Triple positive breast cancer  CURRENT TREATMENT: adjuvant chemotherapy   HISTORY OF CURRENT ILLNESS: From the original intake note:  "Kelly Rowe" noticed bruising in her lateral right breast and palpated a mass  on 12/23/2017. She followed up with her gynecologist. She underwent unilateral right diagnostic mammography with tomography and right breast ultrasonography at [facility] on 01/05/2018 showing: breast density category B. There is an irregular highly suspicious mass within the right breast at the 9:30 o'clock upper outer quadrant, measuring 1.8 x 1.1 x 1.5 cm, and located 18 cm from the nipple,  Ultrasonography revealed a single morphologically abnormal lymph node in the RIGHT axilla, with cortical thickness of 6 mm.   Accordingly on 01/11/2018 she proceeded to biopsy of the right breast area in question as well as a suspicious lymph node. The pathology from this procedure showed ([id number]): Invasive ductal carcinoma grade III.  The lymph node biopsied was negative for carcinoma (concordant).. Prognostic indicators significant for: estrogen receptor, 60% positive with weak staining intensity and progesterone receptor, 10% positive with strong staining intensity. Proliferation marker Ki67 at 30%. HER2 amplified with ratios HER2/CEP17 signals 2.26 and average HER2 copies per cell 3.50  The patient's subsequent history is as detailed  below.  INTERVAL HISTORY: Kelly Rowe returns today for follow up and treatment of her triple positive breast cancer. She is receiving adjuvant chemotherapy with Gemcitabine (previously Docetaxel), Carboplatin, and Trastuzumab,  given on day 1 of every 21 day cycle. She receives [place] support on day 3. Today is day 8 cycle 4.   REVIEW OF SYSTEMS: [name] is doing moderately well today.  She notes an improvement in how she feels following chemotherapy.  She hasn't had as much bony aches and pains.  She does feel like she might be getting constipated.  She also notes a keloid under her right arm that is becoming painful.  She says her blood sugars at home have been doing well.  She says yesterday her blood sugar was 155.  Otherwise [name] denies fevers, chills, headaches, vision changes, mucositis, nausea, vomiting, abdominal pain, diarrhea or any other concerns.  A detailed ROS is otherwise non contributory.    PAST MEDICAL HISTORY: Past Medical History:  Diagnosis Date  . ANEMIA-IRON DEFICIENCY 01/30/2010  . ANXIETY 11/27/2007  . Arthritis   . Asthma 02/25/2011  . Cancer ([place])    breast  . Carbuncle and furuncle of trunk 04/16/2010  . Chlamydia infection 03/21/2008  . DIABETES MELLITUS, TYPE II 08/02/2007  . Edema 01/30/2010  . ELEVATED BLOOD PRESSURE WITHOUT DIAGNOSIS OF HYPERTENSION 08/02/2007  . Family history of breast cancer   . Family history of colon cancer   . Family history of lung cancer   . FREQUENCY, URINARY 05/19/2009  . GENITAL HERPES 03/12/2009  . GERD (gastroesophageal reflux disease)   . History of kidney stones   . HIV (human immunodeficiency virus infection) ([place]) 2009  . HIV INFECTION 03/12/2009  . HSV (herpes simplex virus) infection   . HYPERLIPIDEMIA 08/02/2007  . Hypertension   .  Metrorrhagia 03/21/2008  . PONV (postoperative nausea and vomiting)    woke up crying per patient   . RETENTION, URINE 05/19/2009  . Trichomonas infection 01/19/2010  . VITAMIN D DEFICIENCY  01/30/2010    PAST SURGICAL HISTORY: Past Surgical History:  Procedure Laterality Date  . ABDOMINAL HYSTERECTOMY  07/22/2010   TAH WITH PRESERVATION OF BOTH TUBES AND OVARIES  . BREAST LUMPECTOMY WITH RADIOACTIVE SEED AND SENTINEL LYMPH NODE BIOPSY Right 02/23/2018   Procedure: BREAST LUMPECTOMY WITH RADIOACTIVE SEED AND SENTINEL LYMPH NODE BIOPSY;  Surgeon: Stark Klein, MD;  Location: [facility];  Service: General;  Laterality: Right;  . CHOLECYSTECTOMY    . ENDOMETRIAL ABLATION  01/11/2008   HER OPTION  . ESOPHAGOGASTRODUODENOSCOPY    . MULTIPLE TOOTH EXTRACTIONS    . PORTACATH PLACEMENT Left 02/23/2018   Procedure: INSERTION PORT-A-CATH;  Surgeon: Stark Klein, MD;  Location: [facility];  Service: General;  Laterality: Left;  . PORTACATH PLACEMENT N/A 04/21/2018   Procedure: PORT-A-CATH REVISION;  Surgeon: Stark Klein, MD;  Location: [facility];  Service: General;  Laterality: N/A;  . TUBAL LIGATION    . WISDOM TOOTH EXTRACTION      FAMILY HISTORY Family History  Problem Relation Age of Onset  . Prostate cancer Other   . Heart disease Other   . Stroke Other   . Diabetes Mother   . Hypertension Mother   . Diabetes Father   . Cancer Father        COLON and LU NG  . Stroke Paternal Uncle   . Breast cancer Paternal Aunt   . Lung cancer Maternal Grandmother 10       Mesothelioma  . Colon cancer Paternal Grandfather        dx over 57s  . Lung cancer Paternal Aunt   . Breast cancer Paternal Aunt   . Breast cancer Paternal Aunt   . Heart attack Maternal Grandfather 71  . Colon cancer Other        MGM's 5 brothers  The patient's father died at age 74 due to metastatic liver cancer. The patient's mother is alive at 83. The patient's has 1 brother and 3 sisters. There was a paternal grandfather with colon cancer. There were 3 paternal aunts with breast cancer, 2 diagnosed in the 61's and 1 at age 10. There was a maternal grandmother with mesothelioma. The patient otherwise denies a history of  ovarian cancer in the family.    GYNECOLOGIC HISTORY:  Patient's last menstrual period was 06/21/2010. Menarche: 47 years old Age at first live birth: 47 years old She is GXP5. Her LMP was December 2011. She is status post partial hysterectomy without oophorectomy. She took oral contraception for 3 years with no complications. She never took HRT.    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  At home are 2 of her sons, Glendell Docker and [name], and the patient's grandson, Amador Cunas 21 (who is Willie's son). The patient's oldest is [place] age 66 who lives in [place] as a cook. Daughter, [place] age 43 works as a Scientist, water quality. Son, Glendell Docker age 17 is disabled. Son, [name] age 69 lives in [place] as a Microbiologist. Daughter, Benard Rink age 7 also is a Microbiologist. The patient has 5 grandchildren and no great grandchildren. The patient does not belong to a church.        ADVANCED DIRECTIVES: Not in place; at the 01/18/2018 visit the patient was given the appropriate documents to complete on notarized at her discretion  HEALTH MAINTENANCE: Social History   Tobacco Use  . Smoking status: Never Smoker  . Smokeless tobacco: Never Used  Substance Use Topics  . Alcohol use: Never    Alcohol/week: 0.0 standard drinks    Frequency: Never  . Drug use: No     Colonoscopy: Not yet  PAP: November 2018  Bone density: Never   Allergies  Allergen Reactions  . Levaquin [Levofloxacin In D5w] Nausea And Vomiting and Rash  . Penicillins Swelling and Rash    Has patient had a PCN reaction causing immediate rash, facial/tongue/throat swelling, SOB or lightheadedness with hypotension: Yes Has patient had a PCN reaction causing severe rash involving mucus membranes or skin necrosis: No Has patient had a PCN reaction that required hospitalization: Yes Has patient had a PCN reaction occurring within the last 10 years: No If all of the above answers are "NO", then may proceed with Cephalosporin use.    Current  Outpatient Medications  Medication Sig Dispense Refill  . albuterol (PROVENTIL HFA;VENTOLIN HFA) 108 (90 Base) MCG/ACT inhaler Inhale 2 puffs into the lungs every 6 (six) hours as needed for wheezing or shortness of breath.    Marland Kitchen aspirin 81 MG tablet Take 81 mg by mouth daily as needed (chest pain).     . Azelastine-Fluticasone (DYMISTA) 137-50 MCG/ACT SUSP Use as directed 1 spray each side twice per day as needed 23 g 5  . Beclomethasone Dipropionate (QNASL) 80 MCG/ACT AERS Place 2 sprays into the nose daily. 1 Inhaler 1  . bictegravir-emtricitabine-tenofovir AF (BIKTARVY) 50-200-25 MG TABS tablet Take 1 tablet by mouth daily.    . blood glucose meter kit and supplies Dispense based on patient and insurance preference. Use up to four times daily as directed. (FOR ICD-10 E10.9, E11.9). 1 each 0  . Diclofenac Sodium (PENNSAID) 2 % SOLN Place 1 application onto the skin 2 (two) times daily. (Patient taking differently: Place 1 application onto the skin 2 (two) times daily as needed. ) 1 Bottle 3  . Dulaglutide (TRULICITY) 1.5 MG/5.0IB SOPN 0.5 ml weekly SQ 6 mL 3  . famotidine (PEPCID) 20 MG tablet Take 1 tablet (20 mg total) by mouth 2 (two) times daily. (Patient taking differently: Take 20 mg by mouth daily as needed for heartburn. ) 60 tablet 3  . ferrous gluconate (IRON 27) 240 (27 FE) MG tablet Take 240 mg by mouth daily as needed (iron).     Marland Kitchen glipiZIDE (GLIPIZIDE XL) 5 MG 24 hr tablet Take 1 tablet (5 mg total) by mouth daily with breakfast. 90 tablet 3  . ibuprofen (ADVIL,MOTRIN) 800 MG tablet Take 1 tablet (800 mg total) by mouth every 8 (eight) hours as needed. (Patient taking differently: Take 800 mg by mouth daily as needed for moderate pain. ) 60 tablet 0  . lidocaine-prilocaine (EMLA) cream Apply to affected area once (Patient taking differently: Apply 1 application topically once. Apply to affected area once) 30 g 3  . loratadine (CLARITIN) 10 MG tablet Take 10 mg by mouth daily.    Marland Kitchen  LORazepam (ATIVAN) 0.5 MG tablet Take 1 tablet (0.5 mg total) by mouth at bedtime as needed (Nausea or vomiting). 30 tablet 0  . metFORMIN (GLUCOPHAGE-XR) 500 MG 24 hr tablet Take 2 tablets (1,000 mg total) by mouth daily with breakfast. Annual appt is due must see provider for future refills 180 tablet 3  . Multiple Vitamin (MULTIVITAMIN WITH MINERALS) TABS tablet Take 1 tablet by mouth daily.    Marland Kitchen omeprazole (PRILOSEC)  40 MG capsule Take 1 capsule (40 mg total) by mouth daily. 30 capsule 0  . ondansetron (ZOFRAN) 8 MG tablet Take 1 tablet (8 mg total) by mouth every 8 (eight) hours as needed for nausea or vomiting. 20 tablet 0  . oxyCODONE (OXY IR/ROXICODONE) 5 MG immediate release tablet Take 1-2 tablets (5-10 mg total) by mouth every 6 (six) hours as needed for moderate pain, severe pain or breakthrough pain. 30 tablet 0  . potassium chloride (K-DUR) 10 MEQ tablet Take 1 tablet (10 mEq total) by mouth daily. Take 40 meq BID x 2 days then 20 meq BID x 5 days 36 tablet 0  . prochlorperazine (COMPAZINE) 10 MG tablet Take 1 tablet (10 mg total) by mouth every 6 (six) hours as needed (Nausea or vomiting). 30 tablet 1  . valACYclovir (VALTREX) 500 MG tablet Take twice daily for 3-5 days 30 tablet 12  . zolpidem (AMBIEN) 10 MG tablet Take 1 tablet (10 mg total) by mouth at bedtime as needed for sleep. 90 tablet 1   No current facility-administered medications for this visit.    Facility-Administered Medications Ordered in Other Visits  Medication Dose Route Frequency Provider Last Rate Last Dose  . 0.9 %  sodium chloride infusion   Intravenous Once [name], [name], MD      . sodium chloride 0.9 % 1,000 mL with potassium chloride 10 mEq infusion   Intravenous Once [name], [name], MD        OBJECTIVE:  See CHL for vitals Vitals:   05/17/18 0923  BP: (!) 143/77  Pulse: 93  Resp: 18  Temp: 98 F (36.7 C)  SpO2: 100%     Body mass index is 45.27 kg/m.   Wt Readings from Last 3  Encounters:  05/17/18 247 lb 8 oz (112.3 kg)  05/16/18 245 lb 12.8 oz (111.5 kg)  05/03/18 251 lb 6.4 oz (114 kg)   ECOG FS: 1 - Symptomatic but completely ambulatory  GENERAL: Patient is a well appearing female in no acute distress HEENT:  Sclerae anicteric.  Oropharynx clear and moist. No ulcerations or evidence of oropharyngeal candidiasis. Neck is supple.  NODES:  No cervical, supraclavicular, or axillary lymphadenopathy palpated.  BREAST EXAM:  Right lumpectomy site benign, see picture for right axilla, left breast benign LUNGS:  Clear to auscultation bilaterally.  No wheezes or rhonchi. HEART:  Regular rate and rhythm. No murmur appreciated. ABDOMEN:  Soft, nontender.  Positive, normoactive bowel sounds. No organomegaly palpated. MSK:  No focal spinal tenderness to palpation. EXTREMITIES:  No peripheral edema.   SKIN:  Clear with no obvious rashes or skin changes. No nail dyscrasia. NEURO:  Nonfocal. Well oriented.  Appropriate affect. Right axilla on 05/17/2018    LAB RESULTS:  CMP     Component Value Date/Time   NA 139 05/10/2018 0902   K 3.5 05/10/2018 0902   CL 100 05/10/2018 0902   CO2 27 05/10/2018 0902   GLUCOSE 276 (H) 05/10/2018 0902   BUN 8 05/10/2018 0902   CREATININE 0.82 05/10/2018 0902   CALCIUM 8.6 (L) 05/10/2018 0902   PROT 6.7 05/10/2018 0902   ALBUMIN 3.3 (L) 05/10/2018 0902   AST 25 05/10/2018 0902   ALT 25 05/10/2018 0902   ALKPHOS 174 (H) 05/10/2018 0902   BILITOT 0.4 05/10/2018 0902   GFRNONAA >60 05/10/2018 0902   GFRAA >60 05/10/2018 0902    No results found for: TOTALPROTELP, ALBUMINELP, A1GS, A2GS, BETS, BETA2SER, GAMS, MSPIKE, SPEI  No  results found for: KPAFRELGTCHN, LAMBDASER, [facility]  Lab Results  Component Value Date   WBC 2.5 (L) 05/17/2018   NEUTROABS PENDING 05/17/2018   HGB 8.7 (L) 05/17/2018   HCT 26.7 (L) 05/17/2018   MCV 88.7 05/17/2018   PLT 265 05/17/2018    @LASTCHEMISTRY @  No results found for:  LABCA2  No components found for: [id number]  No results for input(s): INR in the last 168 hours.  No results found for: LABCA2  No results found for: XVQ008  No results found for: QPY195  No results found for: KDT267  No results found for: CA2729  No components found for: HGQUANT  No results found for: CEA1 / No results found for: CEA1   No results found for: AFPTUMOR  No results found for: [place]  No results found for: PSA1  Appointment on 05/17/2018  Component Date Value Ref Range Status  . WBC Count 05/17/2018 2.5* 4.0 - 10.5 K/uL Final  . RBC 05/17/2018 3.01* 3.87 - 5.11 MIL/uL Final  . Hemoglobin 05/17/2018 8.7* 12.0 - 15.0 g/dL Final  . HCT 05/17/2018 26.7* 36.0 - 46.0 % Final  . MCV 05/17/2018 88.7  80.0 - 100.0 fL Final  . MCH 05/17/2018 28.9  26.0 - 34.0 pg Final  . MCHC 05/17/2018 32.6  30.0 - 36.0 g/dL Final  . RDW 05/17/2018 17.3* 11.5 - 15.5 % Final  . Platelet Count 05/17/2018 265  150 - 400 K/uL Final  . nRBC 05/17/2018 0.0  0.0 - 0.2 % Final   Performed at [facility], [place] [address], [place], [place] [address]  . Neutrophils Relative % 05/17/2018 PENDING  % Incomplete  . Neutro Abs 05/17/2018 PENDING  1.7 - 7.7 K/uL Incomplete  . Band Neutrophils 05/17/2018 PENDING  % Incomplete  . Lymphocytes Relative 05/17/2018 PENDING  % Incomplete  . Lymphs Abs 05/17/2018 PENDING  0.7 - 4.0 K/uL Incomplete  . Monocytes Relative 05/17/2018 PENDING  % Incomplete  . Monocytes Absolute 05/17/2018 PENDING  0.1 - 1.0 K/uL Incomplete  . Eosinophils Relative 05/17/2018 PENDING  % Incomplete  . Eosinophils Absolute 05/17/2018 PENDING  0.0 - 0.5 K/uL Incomplete  . Basophils Relative 05/17/2018 PENDING  % Incomplete  . Basophils Absolute 05/17/2018 PENDING  0.0 - 0.1 K/uL Incomplete  . WBC Morphology 05/17/2018 PENDING   Incomplete  . RBC Morphology 05/17/2018 PENDING   Incomplete  . Smear Review 05/17/2018 PENDING   Incomplete  . Other  05/17/2018 PENDING  % Incomplete  . nRBC 05/17/2018 PENDING  0 /100 WBC Incomplete  . Metamyelocytes Relative 05/17/2018 PENDING  % Incomplete  . Myelocytes 05/17/2018 PENDING  % Incomplete  . Promyelocytes Relative 05/17/2018 PENDING  % Incomplete  . Blasts 05/17/2018 PENDING  % Incomplete    (this displays the last labs from the last 3 days)  No results found for: TOTALPROTELP, ALBUMINELP, A1GS, A2GS, BETS, BETA2SER, GAMS, MSPIKE, SPEI (this displays SPEP labs)  No results found for: KPAFRELGTCHN, LAMBDASER, KAPLAMBRATIO (kappa/lambda light chains)  No results found for: HGBA, HGBA2QUANT, HGBFQUANT, HGBSQUAN (Hemoglobinopathy evaluation)   No results found for: LDH  Lab Results  Component Value Date   IRON 35 (L) 01/30/2010   IRONPCTSAT 8.8 (L) 01/30/2010   (Iron and TIBC)  No results found for: FERRITIN  Urinalysis    Component Value Date/Time   COLORURINE RED (A) 04/01/2018 0202   APPEARANCEUR HAZY (A) 04/01/2018 0202   LABSPEC 1.021 04/01/2018 0202   PHURINE 6.0 04/01/2018 0202   GLUCOSEU >=500 (A) 04/01/2018  0202   GLUCOSEU NEGATIVE 09/27/2017 1002   HGBUR LARGE (A) 04/01/2018 0202   BILIRUBINUR NEGATIVE 04/01/2018 0202   KETONESUR 5 (A) 04/01/2018 0202   PROTEINUR 100 (A) 04/01/2018 0202   UROBILINOGEN 0.2 09/27/2017 1002   NITRITE NEGATIVE 04/01/2018 0202   LEUKOCYTESUR NEGATIVE 04/01/2018 0202     STUDIES: Dg Chest Port 1 View  Result Date: 04/21/2018 CLINICAL DATA:  Status post Port-A-Cath placement. EXAM: PORTABLE CHEST 1 VIEW COMPARISON:  04/07/2018 FINDINGS: New left anterior chest wall Port-A-Cath extends through the subclavian vein. Tip projects in the right atrium. No pneumothorax.  No acute findings in the lungs. Cardiac silhouette is normal in size. No mediastinal or hilar masses. Stable changes from prior right breast surgery. IMPRESSION: 1. Left anterior chest wall Port-A-Cath. Tip projects in the right atrium. 2. No pneumothorax.  No acute  cardiopulmonary disease. Electronically Signed   By: Lajean Manes M.D.   On: 04/21/2018 11:17   Dg C-arm 1-60 Min-no Report  Result Date: 04/21/2018 Fluoroscopy was utilized by the requesting physician.  No radiographic interpretation.    ELIGIBLE FOR AVAILABLE RESEARCH PROTOCOL: no  ASSESSMENT: 47 y.o. [place], [name] woman status post right breast upper outer quadrant biopsy 01/11/2018 for a clinical T1 N0, stage IA invasive ductal carcinoma, grade 3, estrogen and progesterone receptor positive, HER-2 amplified, with an MIB-1 of 30%.  (1) status post right lumpectomy and sentinel lymph node sampling 02/23/2018 for a pT2 pN1, stage IB invasive ductal carcinoma, grade 3, with close but negative margins  (2) adjuvant chemotherapy consisting of carboplatin, docetaxel, trastuzumab and Pertuzumab every 21 days x 6 started 03/08/2018  (a) pertuzumab stopped after cycle 1 due to diarrhea  (b) docetaxel changed to gemcitabine after cycle 2 due to persistent hyperglycemia  (3) anti-HER-2 immunotherapy to start concurrently with chemotherapy  (a) baseline echocardiogram on 02/07/2018 showed an ejection fraction in the 55-60% range  (4) adjuvant radiation to follow  (5) antiestrogens to follow at the completion of local treatment  (6) genetics testing through [facility] Multi-cancer and Breast panel on 01/13/2018 showed: no deleterious mutations. The following genes were evaluated for sequence changes and exonic deletions/duplications: ALK, APC, ATM, AXIN2, BAP1, BARD1, BLM, BMPR1A, BRCA1, BRCA2, BRIP1, CASR, CDC73, CDH1, CDK4, CDKN1B, CDKN1C,CDKN2A (p14ARF), CDKN2A (p16INK4a), CEBPA, CHEK2, CTNNA1, DICER1, DIS3L2, EPCAM*, FH, FLCN, GATA2, GPC3, GREM1*, HRAS, KIT, MAX, MEN1, MET, MLH1, MSH2, MSH3, MSH6, MUTYH, NBN, NF1, NF2, PALB2, PDGFRA, PHOX2B*, PMS2, POLD1,POLE, POT1, PRKAR1A, PTCH1, PTEN, RAD50, RAD51C, RAD51D, RB1, RECQL4, RET, RUNX1, SDHAF2, SDHB, SDHC, SDHD,SMAD4, SMARCA4, SMARCB1, SMARCE1,  STK11, SUFU, TERC, TERT, TMEM127, TP53, TSC1, TSC2, VHL, WRN*, WT1.The following genes were evaluated for sequence changes only: EGFR*, HOXB13*, MITF*, NTHL1*, SDHA  PLAN: Alexsis is doing moderately well today.  Her CBC is back and it is stable.  I cannot tell yet if she is neutropenic. We will call her if she needs to follow neutropenic precautions.  She continues to tolerate chemotherapy well.  Arley feels constipated.  We reviewed the use of stool softeners and miralax if needed.  Leimomi has right axillary pain.  Her incision looks like there might be some fluid along the incision site.  I took a picture of it (see above) and will forward to Dr. Barry Dienes for her to review.  I recommended warm compresses and f/u with Dr. Barry Dienes.  Jennfier and I reviewed that she has a colonoscopy in mid December.  Her sixth cycle of chemo is 1 week prior.  I would recommend at least 2  weeks separation between the chemotherapy and colonoscopy.  She was originally scheduled to have chemotherapy on 11/27 but that is the day before thanksgiving.  She is going to consider changing either her colonoscopy appointment or the chemo appointment and will let us know.    [place] will return in 2 weeks for labs, f/u and her fifth cycle of adjuvant chemotherapy  She knows to call for any questions or concerns prior to her next appointment with [place].    A total of (30) minutes of face-to-face time was spent with this patient with greater than 50% of that time in counseling and care-coordination.   Wilber Bihari, NP 05/17/18 9:38 AM Medical Oncology and Hematology [facility] [address] [place], [place] [address] Tel. [phone]    Fax. [phone]

## 2018-05-17 NOTE — Telephone Encounter (Signed)
Potassium 20 mEq po daily x 7 days sent to patient pharmacy.  Patient aware.

## 2018-05-18 ENCOUNTER — Other Ambulatory Visit: Payer: Self-pay | Admitting: General Surgery

## 2018-05-18 ENCOUNTER — Telehealth: Payer: Self-pay | Admitting: Adult Health

## 2018-05-18 DIAGNOSIS — N63 Unspecified lump in unspecified breast: Secondary | ICD-10-CM

## 2018-05-18 NOTE — Telephone Encounter (Signed)
Per 10/23 no los

## 2018-05-19 ENCOUNTER — Ambulatory Visit
Admission: RE | Admit: 2018-05-19 | Discharge: 2018-05-19 | Disposition: A | Discharge status: Home / Self Care | Payer: BC Managed Care – PPO | Source: Ambulatory Visit | Attending: General Surgery | Admitting: General Surgery

## 2018-05-19 DIAGNOSIS — N63 Unspecified lump in unspecified breast: Secondary | ICD-10-CM

## 2018-05-24 LAB — AEROBIC/ANAEROBIC CULTURE (SURGICAL/DEEP WOUND): Culture: NORMAL

## 2018-05-24 LAB — AEROBIC/ANAEROBIC CULTURE W GRAM STAIN (SURGICAL/DEEP WOUND)

## 2018-05-29 ENCOUNTER — Inpatient Hospital Stay: Payer: BC Managed Care – PPO | Attending: Oncology

## 2018-05-29 ENCOUNTER — Encounter: Payer: Self-pay | Admitting: Oncology

## 2018-05-29 ENCOUNTER — Encounter (HOSPITAL_COMMUNITY): Payer: BC Managed Care – PPO | Admitting: Cardiology

## 2018-05-29 ENCOUNTER — Ambulatory Visit (HOSPITAL_COMMUNITY): Admission: RE | Admit: 2018-05-29 | Payer: BC Managed Care – PPO | Source: Ambulatory Visit

## 2018-05-29 DIAGNOSIS — Z79899 Other long term (current) drug therapy: Secondary | ICD-10-CM | POA: Insufficient documentation

## 2018-05-29 DIAGNOSIS — I10 Essential (primary) hypertension: Secondary | ICD-10-CM | POA: Insufficient documentation

## 2018-05-29 DIAGNOSIS — R21 Rash and other nonspecific skin eruption: Secondary | ICD-10-CM | POA: Insufficient documentation

## 2018-05-29 DIAGNOSIS — Z170 Estrogen receptor positive status [ER+]: Secondary | ICD-10-CM | POA: Insufficient documentation

## 2018-05-29 DIAGNOSIS — C50411 Malignant neoplasm of upper-outer quadrant of right female breast: Secondary | ICD-10-CM | POA: Insufficient documentation

## 2018-05-29 DIAGNOSIS — B20 Human immunodeficiency virus [HIV] disease: Secondary | ICD-10-CM | POA: Insufficient documentation

## 2018-05-29 DIAGNOSIS — Z7982 Long term (current) use of aspirin: Secondary | ICD-10-CM | POA: Insufficient documentation

## 2018-05-29 DIAGNOSIS — E119 Type 2 diabetes mellitus without complications: Secondary | ICD-10-CM | POA: Insufficient documentation

## 2018-05-29 DIAGNOSIS — D696 Thrombocytopenia, unspecified: Secondary | ICD-10-CM | POA: Insufficient documentation

## 2018-05-29 DIAGNOSIS — Z5111 Encounter for antineoplastic chemotherapy: Secondary | ICD-10-CM | POA: Insufficient documentation

## 2018-05-29 DIAGNOSIS — E876 Hypokalemia: Secondary | ICD-10-CM | POA: Insufficient documentation

## 2018-05-29 DIAGNOSIS — Z5112 Encounter for antineoplastic immunotherapy: Secondary | ICD-10-CM | POA: Insufficient documentation

## 2018-05-29 DIAGNOSIS — D701 Agranulocytosis secondary to cancer chemotherapy: Secondary | ICD-10-CM | POA: Insufficient documentation

## 2018-05-29 NOTE — Progress Notes (Signed)
Patient came in to bring proof of income for [employer].  Patient approved for one-time $1000 grant. Went over in detail the expenses it covers and the process. Gave patient a copy of the approval letter as well as the expense sheet along with the Outpatient pharmacy information. Patient received a gas card today and submitted some bills for payment. She has my card for any additional financial questions or concerns.

## 2018-05-31 ENCOUNTER — Telehealth: Payer: Self-pay | Admitting: *Deleted

## 2018-05-31 ENCOUNTER — Inpatient Hospital Stay: Payer: BC Managed Care – PPO

## 2018-05-31 ENCOUNTER — Telehealth: Payer: Self-pay | Admitting: Oncology

## 2018-05-31 VITALS — BP 129/82 | HR 89 | Temp 98.6°F | Resp 18

## 2018-05-31 DIAGNOSIS — Z5112 Encounter for antineoplastic immunotherapy: Secondary | ICD-10-CM | POA: Diagnosis not present

## 2018-05-31 DIAGNOSIS — C50911 Malignant neoplasm of unspecified site of right female breast: Secondary | ICD-10-CM

## 2018-05-31 DIAGNOSIS — C773 Secondary and unspecified malignant neoplasm of axilla and upper limb lymph nodes: Secondary | ICD-10-CM

## 2018-05-31 DIAGNOSIS — Z5111 Encounter for antineoplastic chemotherapy: Secondary | ICD-10-CM | POA: Diagnosis not present

## 2018-05-31 DIAGNOSIS — C50411 Malignant neoplasm of upper-outer quadrant of right female breast: Secondary | ICD-10-CM

## 2018-05-31 DIAGNOSIS — D701 Agranulocytosis secondary to cancer chemotherapy: Secondary | ICD-10-CM | POA: Diagnosis not present

## 2018-05-31 DIAGNOSIS — Z170 Estrogen receptor positive status [ER+]: Secondary | ICD-10-CM | POA: Diagnosis not present

## 2018-05-31 DIAGNOSIS — R21 Rash and other nonspecific skin eruption: Secondary | ICD-10-CM | POA: Diagnosis not present

## 2018-05-31 DIAGNOSIS — D696 Thrombocytopenia, unspecified: Secondary | ICD-10-CM | POA: Diagnosis not present

## 2018-05-31 DIAGNOSIS — I10 Essential (primary) hypertension: Secondary | ICD-10-CM | POA: Diagnosis not present

## 2018-05-31 DIAGNOSIS — Z7982 Long term (current) use of aspirin: Secondary | ICD-10-CM | POA: Diagnosis not present

## 2018-05-31 DIAGNOSIS — Z79899 Other long term (current) drug therapy: Secondary | ICD-10-CM | POA: Diagnosis not present

## 2018-05-31 DIAGNOSIS — Z95828 Presence of other vascular implants and grafts: Secondary | ICD-10-CM

## 2018-05-31 DIAGNOSIS — B20 Human immunodeficiency virus [HIV] disease: Secondary | ICD-10-CM | POA: Diagnosis not present

## 2018-05-31 DIAGNOSIS — E876 Hypokalemia: Secondary | ICD-10-CM | POA: Diagnosis not present

## 2018-05-31 DIAGNOSIS — E119 Type 2 diabetes mellitus without complications: Secondary | ICD-10-CM | POA: Diagnosis not present

## 2018-05-31 LAB — CBC WITH DIFFERENTIAL (CANCER CENTER ONLY)
Abs Immature Granulocytes: 0.01 10*3/uL (ref 0.00–0.07)
Basophils Absolute: 0 10*3/uL (ref 0.0–0.1)
Basophils Relative: 0 %
Eosinophils Absolute: 0 10*3/uL (ref 0.0–0.5)
Eosinophils Relative: 0 %
HCT: 28.3 % — ABNORMAL LOW (ref 36.0–46.0)
Hemoglobin: 8.8 g/dL — ABNORMAL LOW (ref 12.0–15.0)
Immature Granulocytes: 0 %
Lymphocytes Relative: 58 %
Lymphs Abs: 1.5 10*3/uL (ref 0.7–4.0)
MCH: 29.4 pg (ref 26.0–34.0)
MCHC: 31.1 g/dL (ref 30.0–36.0)
MCV: 94.6 fL (ref 80.0–100.0)
Monocytes Absolute: 0.3 10*3/uL (ref 0.1–1.0)
Monocytes Relative: 10 %
Neutro Abs: 0.9 10*3/uL — ABNORMAL LOW (ref 1.7–7.7)
Neutrophils Relative %: 32 %
Platelet Count: 85 10*3/uL — ABNORMAL LOW (ref 150–400)
RBC: 2.99 MIL/uL — ABNORMAL LOW (ref 3.87–5.11)
RDW: 20.1 % — ABNORMAL HIGH (ref 11.5–15.5)
WBC Count: 2.7 10*3/uL — ABNORMAL LOW (ref 4.0–10.5)
nRBC: 0 % (ref 0.0–0.2)

## 2018-05-31 LAB — CMP (CANCER CENTER ONLY)
ALT: 19 U/L (ref 0–44)
AST: 23 U/L (ref 15–41)
Albumin: 3.4 g/dL — ABNORMAL LOW (ref 3.5–5.0)
Alkaline Phosphatase: 164 U/L — ABNORMAL HIGH (ref 38–126)
Anion gap: 12 (ref 5–15)
BUN: 10 mg/dL (ref 6–20)
CO2: 25 mmol/L (ref 22–32)
Calcium: 8.6 mg/dL — ABNORMAL LOW (ref 8.9–10.3)
Chloride: 103 mmol/L (ref 98–111)
Creatinine: 0.83 mg/dL (ref 0.44–1.00)
GFR, Est AFR Am: >60 mL/min (ref >60)
GFR, Estimated: >60 mL/min (ref >60)
Glucose, Bld: 167 mg/dL — ABNORMAL HIGH (ref 70–99)
Potassium: 3.6 mmol/L (ref 3.5–5.1)
Sodium: 140 mmol/L (ref 135–145)
Total Bilirubin: 0.2 mg/dL — ABNORMAL LOW (ref 0.3–1.2)
Total Protein: 7.1 g/dL (ref 6.5–8.1)

## 2018-05-31 MED ORDER — SODIUM CHLORIDE 0.9% FLUSH
10.0000 mL | Freq: Once | INTRAVENOUS | Status: AC
  Administered 2018-05-31: 10 mL
  Filled 2018-05-31: qty 10

## 2018-05-31 MED ORDER — HEPARIN SOD (PORK) LOCK FLUSH 100 UNIT/ML IV SOLN
500.0000 [IU] | Freq: Once | INTRAVENOUS | Status: AC
  Administered 2018-05-31: 500 [IU]
  Filled 2018-05-31: qty 5

## 2018-05-31 MED ORDER — SODIUM CHLORIDE 0.9 % IV SOLN
Freq: Once | INTRAVENOUS | Status: AC
  Administered 2018-05-31: 09:00:00 via INTRAVENOUS
  Filled 2018-05-31: qty 250

## 2018-05-31 NOTE — Telephone Encounter (Signed)
Per notification by treatment room nurse of [facility] of 0.9 and plts of [phone] regarding ok to treat with abnormal labs.  Noted pt is scheduled for neulasta support.  Per MD review- post pone treatment for 1 week.  Treatment room nurse notified and this RN sent scheduling request.

## 2018-05-31 NOTE — Patient Instructions (Addendum)
Neutropenia Neutropenia is a condition that occurs when you have a lower-than-normal level of a type of white blood cell (neutrophil) in your body. Neutrophils are made in the spongy center of large bones (bone marrow) and they fight infections. Neutrophils are your body's main defense against bacterial and fungal infections. The fewer neutrophils you have and the longer your body remains without them, the greater your risk of getting a severe infection. What are the causes? This condition can occur if your body uses up or destroys neutrophils faster than your bone marrow can make them. This problem may happen because of:  Bacterial or fungal infection.  Allergic disorders.  Reactions to some medicines.  Autoimmune disease.  An enlarged spleen.  This condition can also occur if your bone marrow does not produce enough neutrophils. This problem may be caused by:  Cancer.  Cancer treatments, such as radiation or chemotherapy.  Viral infections.  Medicines, such as phenytoin.  Vitamin B12 deficiency.  Diseases of the bone marrow.  Environmental toxins, such as insecticides.  What are the signs or symptoms? This condition does not usually cause symptoms. If symptoms are present, they are usually caused by an underlying infection. Symptoms of an infection may include:  Fever.  Chills.  Swollen glands.  Oral or anal ulcers.  Cough and shortness of breath.  Rash.  Skin infection.  Fatigue.  How is this diagnosed? Your health care provider may suspect neutropenia if you have:  A condition that may cause neutropenia.  Symptoms of infection, especially fever.  Frequent and unusual infections.  You will have a medical history and physical exam. Tests will also be done, such as:  A complete blood count (CBC).  A procedure to collect a sample of bone marrow for examination (bone marrow biopsy).  A chest X-ray.  A urine culture.  A blood culture.  How is this  treated? Treatment depends on the underlying cause and severity of your condition. Mild neutropenia may not require treatment. Treatment may include medicines, such as:  Antibiotic medicine given through an IV tube.  Antiviral medicines.  Antifungal medicines.  A medicine to increase neutrophil production (colony-stimulating factor). You may get this drug through an IV tube or by injection.  Steroids given through an IV tube.  If an underlying condition is causing neutropenia, you may need treatment for that condition. If medicines you are taking are causing neutropenia, your health care provider may have you stop taking those medicines. Follow these instructions at home: Medicines  Take over-the-counter and prescription medicines only as told by your health care provider.  Get a seasonal flu shot (influenza vaccine). Lifestyle  Do not eat unpasteurized foods.Do not eat unwashed raw fruits or vegetables.  Avoid exposure to groups of people or children.  Avoid being around people who are sick.  Avoid being around dirt or dust, such as in construction areas or gardens.  Do not provide direct care for pets. Avoid animal droppings. Do not clean litter boxes and bird cages. Hygiene   Bathe daily.  Clean the area between the genitals and the anus (perineal area) after you urinate or have a bowel movement. If you are female, wipe from front to back.  Brush your teeth with a soft toothbrush before and after meals.  Do not use a razor that has a blade. Use an electric razor to remove hair.  Wash your hands often. Make sure others who come in contact with you also wash their hands. If soap and water  are not available, use hand sanitizer. General instructions  Do not have sex unless your health care provider has approved.  Take actions to avoid cuts and burns. For example: ? Be cautious when you use knives. Always cut away from yourself. ? Keep knives in protective sheaths or  guards when not in use. ? Use oven mitts when you cook with a hot stove, oven, or grill. ? Stand a safe distance away from open fires.  Avoid people who received a vaccine in the past 30 days if that vaccine contained a live version of the germ (live vaccine). You should not get a live vaccine. Common live vaccines are varicella, measles, mumps, and rubella.  Do not share food utensils.  Do not use tampons, enemas, or rectal suppositories unless your health care provider has approved.  Keep all appointments as told by your health care provider. This is important. Contact a health care provider if:  You have a fever.  You have chills or you start to shake.  You have: ? A sore throat. ? A warm, red, or tender area on your skin. ? A cough. ? Frequent or painful urination. ? Vaginal discharge or itching.  You develop: ? Sores in your mouth or anus. ? Swollen lymph nodes. ? Red streaks on the skin. ? A rash.  You feel: ? Nauseous or you vomit. ? Very fatigued. ? Short of breath. This information is not intended to replace advice given to you by your health care provider. Make sure you discuss any questions you have with your health care provider. Document Released: 01/01/2002 Document Revised: 12/18/2015 Document Reviewed: 01/22/2015 [employer]vier Interactive Patient Education  [employer].

## 2018-05-31 NOTE — Telephone Encounter (Signed)
R/s appt per 11/6 sch message - left message for patient with appt date and time

## 2018-05-31 NOTE — Progress Notes (Signed)
Per Dr. Andrey Spearman to treat without recent echo. Patient has echo scheduled for later this month. Due to pancytopenia, treatment canceled by Dr. Jana Hakim and patient is to return to clinic in one week for f/u. Reviewed neutropenic precautions with patient and masks given. Patient verbalized understanding.

## 2018-06-02 ENCOUNTER — Ambulatory Visit: Payer: BC Managed Care – PPO

## 2018-06-07 ENCOUNTER — Encounter: Payer: Self-pay | Admitting: *Deleted

## 2018-06-07 ENCOUNTER — Inpatient Hospital Stay: Payer: BC Managed Care – PPO

## 2018-06-07 ENCOUNTER — Encounter: Payer: Self-pay | Admitting: Adult Health

## 2018-06-07 ENCOUNTER — Inpatient Hospital Stay (HOSPITAL_BASED_OUTPATIENT_CLINIC_OR_DEPARTMENT_OTHER): Payer: BC Managed Care – PPO | Admitting: Adult Health

## 2018-06-07 ENCOUNTER — Telehealth: Payer: Self-pay | Admitting: Adult Health

## 2018-06-07 VITALS — BP 127/70 | HR 86 | Temp 98.6°F | Resp 20 | Ht 62.0 in | Wt 250.5 lb

## 2018-06-07 DIAGNOSIS — Z170 Estrogen receptor positive status [ER+]: Secondary | ICD-10-CM

## 2018-06-07 DIAGNOSIS — Z95828 Presence of other vascular implants and grafts: Secondary | ICD-10-CM

## 2018-06-07 DIAGNOSIS — C50411 Malignant neoplasm of upper-outer quadrant of right female breast: Secondary | ICD-10-CM

## 2018-06-07 DIAGNOSIS — C773 Secondary and unspecified malignant neoplasm of axilla and upper limb lymph nodes: Secondary | ICD-10-CM

## 2018-06-07 DIAGNOSIS — E119 Type 2 diabetes mellitus without complications: Secondary | ICD-10-CM

## 2018-06-07 DIAGNOSIS — R21 Rash and other nonspecific skin eruption: Secondary | ICD-10-CM | POA: Diagnosis not present

## 2018-06-07 DIAGNOSIS — D696 Thrombocytopenia, unspecified: Secondary | ICD-10-CM

## 2018-06-07 DIAGNOSIS — C50911 Malignant neoplasm of unspecified site of right female breast: Secondary | ICD-10-CM

## 2018-06-07 DIAGNOSIS — D701 Agranulocytosis secondary to cancer chemotherapy: Secondary | ICD-10-CM | POA: Diagnosis not present

## 2018-06-07 DIAGNOSIS — I10 Essential (primary) hypertension: Secondary | ICD-10-CM

## 2018-06-07 LAB — CBC WITH DIFFERENTIAL (CANCER CENTER ONLY)
Abs Immature Granulocytes: 0.01 10*3/uL (ref 0.00–0.07)
Basophils Absolute: 0 10*3/uL (ref 0.0–0.1)
Basophils Relative: 0 %
Eosinophils Absolute: 0 10*3/uL (ref 0.0–0.5)
Eosinophils Relative: 1 %
HCT: 29.4 % — ABNORMAL LOW (ref 36.0–46.0)
Hemoglobin: 9.3 g/dL — ABNORMAL LOW (ref 12.0–15.0)
Immature Granulocytes: 0 %
Lymphocytes Relative: 49 %
Lymphs Abs: 1.6 10*3/uL (ref 0.7–4.0)
MCH: 29.9 pg (ref 26.0–34.0)
MCHC: 31.6 g/dL (ref 30.0–36.0)
MCV: 94.5 fL (ref 80.0–100.0)
Monocytes Absolute: 0.2 10*3/uL (ref 0.1–1.0)
Monocytes Relative: 7 %
Neutro Abs: 1.4 10*3/uL — ABNORMAL LOW (ref 1.7–7.7)
Neutrophils Relative %: 43 %
Platelet Count: 270 10*3/uL (ref 150–400)
RBC: 3.11 MIL/uL — ABNORMAL LOW (ref 3.87–5.11)
RDW: 19.8 % — ABNORMAL HIGH (ref 11.5–15.5)
WBC Count: 3.3 10*3/uL — ABNORMAL LOW (ref 4.0–10.5)
nRBC: 0 % (ref 0.0–0.2)

## 2018-06-07 LAB — CMP (CANCER CENTER ONLY)
ALT: 13 U/L (ref 0–44)
AST: 18 U/L (ref 15–41)
Albumin: 3.4 g/dL — ABNORMAL LOW (ref 3.5–5.0)
Alkaline Phosphatase: 159 U/L — ABNORMAL HIGH (ref 38–126)
Anion gap: 9 (ref 5–15)
BUN: 13 mg/dL (ref 6–20)
CO2: 27 mmol/L (ref 22–32)
Calcium: 8.7 mg/dL — ABNORMAL LOW (ref 8.9–10.3)
Chloride: 102 mmol/L (ref 98–111)
Creatinine: 0.83 mg/dL (ref 0.44–1.00)
GFR, Est AFR Am: >60 mL/min (ref >60)
GFR, Estimated: >60 mL/min (ref >60)
Glucose, Bld: 186 mg/dL — ABNORMAL HIGH (ref 70–99)
Potassium: 3.5 mmol/L (ref 3.5–5.1)
Sodium: 138 mmol/L (ref 135–145)
Total Bilirubin: 0.3 mg/dL (ref 0.3–1.2)
Total Protein: 7.2 g/dL (ref 6.5–8.1)

## 2018-06-07 MED ORDER — SODIUM CHLORIDE 0.9 % IV SOLN
675.0000 mg | Freq: Once | INTRAVENOUS | Status: AC
  Administered 2018-06-07: 680 mg via INTRAVENOUS
  Filled 2018-06-07: qty 68

## 2018-06-07 MED ORDER — SODIUM CHLORIDE 0.9 % IV SOLN
700.0000 mg/m2 | Freq: Once | INTRAVENOUS | Status: AC
  Administered 2018-06-07: 1596 mg via INTRAVENOUS
  Filled 2018-06-07: qty 41.98

## 2018-06-07 MED ORDER — DIPHENHYDRAMINE HCL 25 MG PO CAPS
25.0000 mg | ORAL_CAPSULE | Freq: Once | ORAL | Status: AC
  Administered 2018-06-07: 25 mg via ORAL

## 2018-06-07 MED ORDER — SODIUM CHLORIDE 0.9% FLUSH
10.0000 mL | Freq: Once | INTRAVENOUS | Status: AC
  Administered 2018-06-07: 10 mL
  Filled 2018-06-07: qty 10

## 2018-06-07 MED ORDER — PALONOSETRON HCL INJECTION 0.25 MG/5ML
INTRAVENOUS | Status: AC
  Filled 2018-06-07: qty 5

## 2018-06-07 MED ORDER — SODIUM CHLORIDE 0.9 % IV SOLN
Freq: Once | INTRAVENOUS | Status: AC
  Administered 2018-06-07: 10:00:00 via INTRAVENOUS
  Filled 2018-06-07: qty 5

## 2018-06-07 MED ORDER — HEPARIN SOD (PORK) LOCK FLUSH 100 UNIT/ML IV SOLN
500.0000 [IU] | Freq: Once | INTRAVENOUS | Status: AC | PRN
  Administered 2018-06-07: 500 [IU]
  Filled 2018-06-07: qty 5

## 2018-06-07 MED ORDER — SODIUM CHLORIDE 0.9 % IV SOLN
Freq: Once | INTRAVENOUS | Status: AC
  Administered 2018-06-07: 09:00:00 via INTRAVENOUS
  Filled 2018-06-07: qty 250

## 2018-06-07 MED ORDER — DIPHENHYDRAMINE HCL 25 MG PO CAPS
ORAL_CAPSULE | ORAL | Status: AC
  Filled 2018-06-07: qty 1

## 2018-06-07 MED ORDER — SODIUM CHLORIDE 0.9% FLUSH
10.0000 mL | INTRAVENOUS | Status: DC | PRN
  Administered 2018-06-07: 10 mL
  Filled 2018-06-07: qty 10

## 2018-06-07 MED ORDER — ACETAMINOPHEN 325 MG PO TABS
ORAL_TABLET | ORAL | Status: AC
  Filled 2018-06-07: qty 2

## 2018-06-07 MED ORDER — TRASTUZUMAB CHEMO 150 MG IV SOLR
6.0000 mg/kg | Freq: Once | INTRAVENOUS | Status: AC
  Administered 2018-06-07: 714 mg via INTRAVENOUS
  Filled 2018-06-07: qty 34

## 2018-06-07 MED ORDER — PALONOSETRON HCL INJECTION 0.25 MG/5ML
0.2500 mg | Freq: Once | INTRAVENOUS | Status: AC
  Administered 2018-06-07: 0.25 mg via INTRAVENOUS

## 2018-06-07 MED ORDER — ACETAMINOPHEN 325 MG PO TABS
650.0000 mg | ORAL_TABLET | Freq: Once | ORAL | Status: AC
  Administered 2018-06-07: 650 mg via ORAL

## 2018-06-07 NOTE — Progress Notes (Signed)
Kelly Rowe  [phone] [phone]     ID: Kelly Rowe DOB: 02/14/1971  MR#: [id number]  PRF#:[id number]  Patient Care Team: Biagio Borg, MD as PCP - [name], MD as Consulting Physician (General Surgery) [name], [name], MD as Consulting Physician (Oncology) [name], MD as Attending Physician (Radiation Oncology) [name], NP as Nurse Practitioner (Obstetrics and Gynecology) Marlinda Mike, PA-C as Referring Physician (Physician Assistant) OTHER MD:   CHIEF COMPLAINT: Triple positive breast cancer  CURRENT TREATMENT: adjuvant chemotherapy   HISTORY OF CURRENT ILLNESS: From the original intake note:  "Kelly Rowe" noticed bruising in her lateral right breast and palpated a mass  on 12/23/2017. She followed up with her gynecologist. She underwent unilateral right diagnostic mammography with tomography and right breast ultrasonography at [facility] on 01/05/2018 showing: breast density category B. There is an irregular highly suspicious mass within the right breast at the 9:30 o'clock upper outer quadrant, measuring 1.8 x 1.1 x 1.5 cm, and located 18 cm from the nipple,  Ultrasonography revealed a single morphologically abnormal lymph node in the RIGHT axilla, with cortical thickness of 6 mm.   Accordingly on 01/11/2018 she proceeded to biopsy of the right breast area in question as well as a suspicious lymph node. The pathology from this procedure showed ([id number]): Invasive ductal carcinoma grade III.  The lymph node biopsied was negative for carcinoma (concordant).. Prognostic indicators significant for: estrogen receptor, 60% positive with weak staining intensity and progesterone receptor, 10% positive with strong staining intensity. Proliferation marker Ki67 at 30%. HER2 amplified with ratios HER2/CEP17 signals 2.26 and average HER2 copies per cell 3.50  The patient's subsequent history is as detailed  below.  INTERVAL HISTORY: Kelly Rowe returns today for follow up and treatment of her triple positive breast cancer. She is receiving adjuvant chemotherapy with Gemcitabine (previously Docetaxel), Carboplatin, and Trastuzumab,  given on day 1 of every 21 day cycle. She receives [place] support on day 3. Today is day 1 cycle 5.   REVIEW OF SYSTEMS: Kelly Rowe is doing well today.  Her treatment was held last week due to neutropenia with an ANC of 0.9 and thrombocytopenia with a plt count of 85.  She is a little worried about what her results were going to be today, however today they are 1.4.  Her back broke out in a rash on Monday this week, but notes that it is resolving.  She denies peripheral neuropathy.  She is without unusual headaches, vision changes.  She isn't having any abdominal concerns, nausea., vomiting, bowel/bladder changes, chest pain, palpitations or any other concerns.  A detailed ROS is otherwise non contributory.    PAST MEDICAL HISTORY: Past Medical History:  Diagnosis Date  . ANEMIA-IRON DEFICIENCY 01/30/2010  . ANXIETY 11/27/2007  . Arthritis   . Asthma 02/25/2011  . Cancer ([place])    breast  . Carbuncle and furuncle of trunk 04/16/2010  . Chlamydia infection 03/21/2008  . DIABETES MELLITUS, TYPE II 08/02/2007  . Edema 01/30/2010  . ELEVATED BLOOD PRESSURE WITHOUT DIAGNOSIS OF HYPERTENSION 08/02/2007  . Family history of breast cancer   . Family history of colon cancer   . Family history of lung cancer   . FREQUENCY, URINARY 05/19/2009  . GENITAL HERPES 03/12/2009  . GERD (gastroesophageal reflux disease)   . History of kidney stones   . HIV (human immunodeficiency virus infection) ([place]) 2009  . HIV INFECTION 03/12/2009  . HSV (herpes simplex virus) infection   .  HYPERLIPIDEMIA 08/02/2007  . Hypertension   . Metrorrhagia 03/21/2008  . PONV (postoperative nausea and vomiting)    woke up crying per patient   . RETENTION, URINE 05/19/2009  . Trichomonas infection 01/19/2010  .  VITAMIN D DEFICIENCY 01/30/2010    PAST SURGICAL HISTORY: Past Surgical History:  Procedure Laterality Date  . ABDOMINAL HYSTERECTOMY  07/22/2010   TAH WITH PRESERVATION OF BOTH TUBES AND OVARIES  . BREAST LUMPECTOMY WITH RADIOACTIVE SEED AND SENTINEL LYMPH NODE BIOPSY Right 02/23/2018   Procedure: BREAST LUMPECTOMY WITH RADIOACTIVE SEED AND SENTINEL LYMPH NODE BIOPSY;  Surgeon: Stark Klein, MD;  Location: [facility];  Service: General;  Laterality: Right;  . CHOLECYSTECTOMY    . ENDOMETRIAL ABLATION  01/11/2008   HER OPTION  . ESOPHAGOGASTRODUODENOSCOPY    . MULTIPLE TOOTH EXTRACTIONS    . PORTACATH PLACEMENT Left 02/23/2018   Procedure: INSERTION PORT-A-CATH;  Surgeon: Stark Klein, MD;  Location: [facility];  Service: General;  Laterality: Left;  . PORTACATH PLACEMENT N/A 04/21/2018   Procedure: PORT-A-CATH REVISION;  Surgeon: Stark Klein, MD;  Location: [facility];  Service: General;  Laterality: N/A;  . TUBAL LIGATION    . WISDOM TOOTH EXTRACTION      FAMILY HISTORY Family History  Problem Relation Age of Onset  . Prostate cancer Other   . Heart disease Other   . Stroke Other   . Diabetes Mother   . Hypertension Mother   . Diabetes Father   . Cancer Father        COLON and LU NG  . Stroke Paternal Uncle   . Breast cancer Paternal Aunt   . Lung cancer Maternal Grandmother 44       Mesothelioma  . Colon cancer Paternal Grandfather        dx over 16s  . Lung cancer Paternal Aunt   . Breast cancer Paternal Aunt   . Breast cancer Paternal Aunt   . Heart attack Maternal Grandfather 71  . Colon cancer Other        MGM's 5 brothers  The patient's father died at age 13 due to metastatic liver cancer. The patient's mother is alive at 6. The patient's has 1 brother and 3 sisters. There was a paternal grandfather with colon cancer. There were 3 paternal aunts with breast cancer, 2 diagnosed in the 84's and 1 at age 58. There was a maternal grandmother with mesothelioma. The patient otherwise  denies a history of ovarian cancer in the family.    GYNECOLOGIC HISTORY:  Patient's last menstrual period was 06/21/2010. Menarche: 47 years old Age at first live birth: 47 years old She is GXP5. Her LMP was December 2011. She is status post partial hysterectomy without oophorectomy. She took oral contraception for 3 years with no complications. She never took HRT.    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  At home are 2 of her sons, Kelly Rowe and Kelly Rowe, and the patient's grandson, Kelly Rowe 55 (who is Willie's son). The patient's oldest is Kelly Rowe age 4 who lives in [place] as a cook. Daughter, Kelly Rowe age 41 works as a Scientist, water quality. Son, Kelly Rowe age 73 is disabled. Son, Kelly Rowe age 81 lives in [place] as a Microbiologist. Daughter, Kelly Rowe age 48 also is a Microbiologist. The patient has 5 grandchildren and no great grandchildren. The patient does not belong to a church.        ADVANCED DIRECTIVES: Not in place; at the 01/18/2018 visit the patient was given the appropriate documents to  complete on notarized at her discretion   HEALTH MAINTENANCE: Social History   Tobacco Use  . Smoking status: Never Smoker  . Smokeless tobacco: Never Used  Substance Use Topics  . Alcohol use: Never    Alcohol/week: 0.0 standard drinks    Frequency: Never  . Drug use: No     Colonoscopy: Not yet  PAP: November 2018  Bone density: Never   Allergies  Allergen Reactions  . Levaquin [Levofloxacin In D5w] Nausea And Vomiting and Rash  . Penicillins Swelling and Rash    Has patient had a PCN reaction causing immediate rash, facial/tongue/throat swelling, SOB or lightheadedness with hypotension: Yes Has patient had a PCN reaction causing severe rash involving mucus membranes or skin necrosis: No Has patient had a PCN reaction that required hospitalization: Yes Has patient had a PCN reaction occurring within the last 10 years: No If all of the above answers are "NO", then may proceed with Cephalosporin  use.    Current Outpatient Medications  Medication Sig Dispense Refill  . albuterol (PROVENTIL HFA;VENTOLIN HFA) 108 (90 Base) MCG/ACT inhaler Inhale 2 puffs into the lungs every 6 (six) hours as needed for wheezing or shortness of breath.    Marland Kitchen aspirin 81 MG tablet Take 81 mg by mouth daily as needed (chest pain).     . Azelastine-Fluticasone (DYMISTA) 137-50 MCG/ACT SUSP Use as directed 1 spray each side twice per day as needed 23 g 5  . Beclomethasone Dipropionate (QNASL) 80 MCG/ACT AERS Place 2 sprays into the nose daily. 1 Inhaler 1  . bictegravir-emtricitabine-tenofovir AF (BIKTARVY) 50-200-25 MG TABS tablet Take 1 tablet by mouth daily.    . blood glucose meter kit and supplies Dispense based on patient and insurance preference. Use up to four times daily as directed. (FOR ICD-10 E10.9, E11.9). 1 each 0  . Diclofenac Sodium (PENNSAID) 2 % SOLN Place 1 application onto the skin 2 (two) times daily. (Patient taking differently: Place 1 application onto the skin 2 (two) times daily as needed. ) 1 Bottle 3  . Dulaglutide (TRULICITY) 1.5 TK/2.4OX SOPN 0.5 ml weekly SQ 6 mL 3  . famotidine (PEPCID) 20 MG tablet Take 1 tablet (20 mg total) by mouth 2 (two) times daily. (Patient taking differently: Take 20 mg by mouth daily as needed for heartburn. ) 60 tablet 3  . ferrous gluconate (IRON 27) 240 (27 FE) MG tablet Take 240 mg by mouth daily as needed (iron).     Marland Kitchen glipiZIDE (GLIPIZIDE XL) 5 MG 24 hr tablet Take 1 tablet (5 mg total) by mouth daily with breakfast. 90 tablet 3  . ibuprofen (ADVIL,MOTRIN) 800 MG tablet Take 1 tablet (800 mg total) by mouth every 8 (eight) hours as needed. (Patient taking differently: Take 800 mg by mouth daily as needed for moderate pain. ) 60 tablet 0  . lidocaine-prilocaine (EMLA) cream Apply to affected area once (Patient taking differently: Apply 1 application topically once. Apply to affected area once) 30 g 3  . loratadine (CLARITIN) 10 MG tablet Take 10 mg by  mouth daily.    Marland Kitchen LORazepam (ATIVAN) 0.5 MG tablet Take 1 tablet (0.5 mg total) by mouth at bedtime as needed (Nausea or vomiting). 30 tablet 0  . metFORMIN (GLUCOPHAGE-XR) 500 MG 24 hr tablet Take 2 tablets (1,000 mg total) by mouth daily with breakfast. Annual appt is due must see provider for future refills 180 tablet 3  . Multiple Vitamin (MULTIVITAMIN WITH MINERALS) TABS tablet Take 1 tablet by  mouth daily.    Marland Kitchen omeprazole (PRILOSEC) 40 MG capsule Take 1 capsule (40 mg total) by mouth daily. 30 capsule 0  . ondansetron (ZOFRAN) 8 MG tablet Take 1 tablet (8 mg total) by mouth every 8 (eight) hours as needed for nausea or vomiting. 20 tablet 0  . oxyCODONE (OXY IR/ROXICODONE) 5 MG immediate release tablet Take 1-2 tablets (5-10 mg total) by mouth every 6 (six) hours as needed for moderate pain, severe pain or breakthrough pain. 30 tablet 0  . potassium chloride (K-DUR) 10 MEQ tablet Take 1 tablet (10 mEq total) by mouth daily. Take 40 meq BID x 2 days then 20 meq BID x 5 days 36 tablet 0  . prochlorperazine (COMPAZINE) 10 MG tablet Take 1 tablet (10 mg total) by mouth every 6 (six) hours as needed (Nausea or vomiting). 30 tablet 1  . valACYclovir (VALTREX) 500 MG tablet Take twice daily for 3-5 days 30 tablet 12  . zolpidem (AMBIEN) 10 MG tablet Take 1 tablet (10 mg total) by mouth at bedtime as needed for sleep. 90 tablet 1  . potassium chloride SA (K-DUR,KLOR-CON) 20 MEQ tablet Take 1 tablet (20 mEq total) by mouth daily for 7 days. 7 tablet 0   No current facility-administered medications for this visit.    Facility-Administered Medications Ordered in Other Visits  Medication Dose Route Frequency Provider Last Rate Last Dose  . 0.9 %  sodium chloride infusion   Intravenous Once [name], [name], MD      . sodium chloride 0.9 % 1,000 mL with potassium chloride 10 mEq infusion   Intravenous Once [name], [name], MD        OBJECTIVE:   There were no vitals filed for this visit.   There  is no height or weight on file to calculate BMI.   Wt Readings from Last 3 Encounters:  05/17/18 247 lb 8 oz (112.3 kg)  05/16/18 245 lb 12.8 oz (111.5 kg)  05/03/18 251 lb 6.4 oz (114 kg)   ECOG FS: 1 - Symptomatic but completely ambulatory  GENERAL: Patient is a well appearing female in no acute distress HEENT:  Sclerae anicteric.  Oropharynx clear and moist. No ulcerations or evidence of oropharyngeal candidiasis. Neck is supple.  NODES:  No cervical, supraclavicular, or axillary lymphadenopathy palpated.  BREAST EXAM:  Right lumpectomy site benign, see picture for right axilla has healed, looks much improved. LUNGS:  Clear to auscultation bilaterally.  No wheezes or rhonchi. HEART:  Regular rate and rhythm. No murmur appreciated. ABDOMEN:  Soft, nontender.  Positive, normoactive bowel sounds. No organomegaly palpated. MSK:  No focal spinal tenderness to palpation. EXTREMITIES:  No peripheral edema.   SKIN:  Clear with no obvious rashes or skin changes. No nail dyscrasia. NEURO:  Nonfocal. Well oriented.  Appropriate affect.     LAB RESULTS:  CMP     Component Value Date/Time   NA 140 05/31/2018 0801   K 3.6 05/31/2018 0801   CL 103 05/31/2018 0801   CO2 25 05/31/2018 0801   GLUCOSE 167 (H) 05/31/2018 0801   BUN 10 05/31/2018 0801   CREATININE 0.83 05/31/2018 0801   CALCIUM 8.6 (L) 05/31/2018 0801   PROT 7.1 05/31/2018 0801   ALBUMIN 3.4 (L) 05/31/2018 0801   AST 23 05/31/2018 0801   ALT 19 05/31/2018 0801   ALKPHOS 164 (H) 05/31/2018 0801   BILITOT 0.2 (L) 05/31/2018 0801   GFRNONAA >60 05/31/2018 0801   GFRAA >60 05/31/2018 0801  No results found for: TOTALPROTELP, ALBUMINELP, A1GS, A2GS, BETS, BETA2SER, GAMS, MSPIKE, SPEI  No results found for: KPAFRELGTCHN, LAMBDASER, KAPLAMBRATIO  Lab Results  Component Value Date   WBC 3.3 (L) 06/07/2018   NEUTROABS 1.4 (L) 06/07/2018   HGB 9.3 (L) 06/07/2018   HCT 29.4 (L) 06/07/2018   MCV 94.5 06/07/2018   PLT 270  06/07/2018    _0 @  No results found for: LABCA2  No components found for: [id number]  No results for input(s): INR in the last 168 hours.  No results found for: LABCA2  No results found for: LGX211  No results found for: HER740  No results found for: CXK481  No results found for: CA2729  No components found for: HGQUANT  No results found for: CEA1 / No results found for: CEA1   No results found for: AFPTUMOR  No results found for: [place]  No results found for: PSA1  Appointment on 06/07/2018  Component Date Value Ref Range Status  . WBC Count 06/07/2018 3.3* 4.0 - 10.5 K/uL Final  . RBC 06/07/2018 3.11* 3.87 - 5.11 MIL/uL Final  . Hemoglobin 06/07/2018 9.3* 12.0 - 15.0 g/dL Final  . HCT 06/07/2018 29.4* 36.0 - 46.0 % Final  . MCV 06/07/2018 94.5  80.0 - 100.0 fL Final  . MCH 06/07/2018 29.9  26.0 - 34.0 pg Final  . MCHC 06/07/2018 31.6  30.0 - 36.0 g/dL Final  . RDW 06/07/2018 19.8* 11.5 - 15.5 % Final  . Platelet Count 06/07/2018 270  150 - 400 K/uL Final  . nRBC 06/07/2018 0.0  0.0 - 0.2 % Final  . Neutrophils Relative % 06/07/2018 43  % Final  . Neutro Abs 06/07/2018 1.4* 1.7 - 7.7 K/uL Final  . Lymphocytes Relative 06/07/2018 49  % Final  . Lymphs Abs 06/07/2018 1.6  0.7 - 4.0 K/uL Final  . Monocytes Relative 06/07/2018 7  % Final  . Monocytes Absolute 06/07/2018 0.2  0.1 - 1.0 K/uL Final  . Eosinophils Relative 06/07/2018 1  % Final  . Eosinophils Absolute 06/07/2018 0.0  0.0 - 0.5 K/uL Final  . Basophils Relative 06/07/2018 0  % Final  . Basophils Absolute 06/07/2018 0.0  0.0 - 0.1 K/uL Final  . Immature Granulocytes 06/07/2018 0  % Final  . Abs Immature Granulocytes 06/07/2018 0.01  0.00 - 0.07 K/uL Final   Performed at [facility], [address] [place], [place] [address]    (this displays the last labs from the last 3 days)  No results found for: TOTALPROTELP, ALBUMINELP, A1GS, A2GS, BETS, BETA2SER, GAMS,  MSPIKE, SPEI (this displays SPEP labs)  No results found for: KPAFRELGTCHN, LAMBDASER, KAPLAMBRATIO (kappa/lambda light chains)  No results found for: HGBA, HGBA2QUANT, HGBFQUANT, HGBSQUAN (Hemoglobinopathy evaluation)   No results found for: LDH  Lab Results  Component Value Date   IRON 35 (L) 01/30/2010   IRONPCTSAT 8.8 (L) 01/30/2010   (Iron and TIBC)  No results found for: FERRITIN  Urinalysis    Component Value Date/Time   COLORURINE RED (A) 04/01/2018 0202   APPEARANCEUR HAZY (A) 04/01/2018 0202   LABSPEC 1.021 04/01/2018 0202   PHURINE 6.0 04/01/2018 0202   GLUCOSEU >=500 (A) 04/01/2018 0202   GLUCOSEU NEGATIVE 09/27/2017 1002   HGBUR LARGE (A) 04/01/2018 0202   BILIRUBINUR NEGATIVE 04/01/2018 0202   KETONESUR 5 (A) 04/01/2018 0202   PROTEINUR 100 (A) 04/01/2018 0202   UROBILINOGEN 0.2 09/27/2017 1002   NITRITE NEGATIVE 04/01/2018 0202   LEUKOCYTESUR NEGATIVE 04/01/2018  0202     STUDIES: US Breast Ltd Uni Right Inc Axilla  Result Date: 05/19/2018 CLINICAL DATA:  Patient presents with right axillary redness, swelling and pain, deep to an axillary node dissection scar from her recent right lumpectomy for breast carcinoma. Has another scar below this in the 10 o'clock position of the right breast from the breast cancer resection. EXAM: ULTRASOUND OF THE RIGHT BREAST/AXILLA COMPARISON:  Previous exam(s). FINDINGS: On physical exam, there is erythema, warmth and tenderness surrounding the axillary scar. There is a keloid that has formed within the axillary scar. There is masslike fullness deep to the axillary scar. There is also prominence deep to the upper-outer quadrant lumpectomy scar. Targeted ultrasound is performed, showing a large collection at 10 o'clock, 20 cm the nipple, deep to the lumpectomy scar. The collection measures 6 x 2.5 x 5.4 cm. In the right axilla, there is a second collection, deep to the axillary node dissection scar, measuring 4.8 x 3.5 x 3.6 cm.  IMPRESSION: 1. There are collections deep to both right breast incisions, the 10 o'clock position lumpectomy scar and the axillary node dissection scar. Inflammation versus cellulitis surrounds the axillary node dissection scar. RECOMMENDATION: Ultrasound-guided aspiration of both collections will be performed. If either appears infected, fluid will be sent for laboratory analysis. I have discussed the findings and recommendations with the patient. Results were also provided in writing at the conclusion of the visit. If applicable, a reminder letter will be sent to the patient regarding the next appointment. BI-RADS CATEGORY  2: Benign. Electronically Signed   By: Lajean Manes M.D.   On: 05/19/2018 08:11   US Breast Aspiration Right  Addendum Date: 05/25/2018   ADDENDUM REPORT: 05/25/2018 14:46 ADDENDUM: Right breast aspiration yielded Gram Stain-RARE WBC PRESENT, PREDOMINANTLY PMN, NO ORGANISMS SEEN. Culture-RARE NORMAL SKIN FLORA, NO ANAEROBES ISOLATED. I notified the patient of results by telephone and her questions were answered. The patient reported taking her antibiotics as prescribed and the affected area was smaller. She was instructed to call for any additional questions or concerns. Surgical consultation has been arranged with Dr. Stark Klein at [facility] on May 26, 2018. Pathology results reported by Terie Purser, RN on 05/25/2018. Electronically Signed   By: Lajean Manes M.D.   On: 05/25/2018 14:46   Result Date: 05/25/2018 CLINICAL DATA:  Aspiration of 2 adjacent right-sided postsurgical collections, 1 deep to the 10 o'clock position lumpectomy scar in the other deep to the axillary node dissection scar. EXAM: ULTRASOUND GUIDED RIGHT BREAST CYST ASPIRATION COMPARISON:  Previous exams. PROCEDURE: Using sterile technique, 1% lidocaine, under direct ultrasound visualization, needle aspiration of the axillary region flexion was performed. A total of 30 cc of reddish brown fluid  was collected. Given the surrounding signs of inflammation/infection, this fluid was sent for laboratory analysis. Using sterile technique, 1% lidocaine, under direct ultrasound visualization, needle aspiration of collection deep to the lumpectomy scar at 10 o'clock was performed. 50 cc of yellowish fluid was aspirated. Both collections were evacuated. IMPRESSION: Ultrasound-guided aspiration of two postoperative collections on the right. Collection in the right axilla deep to the axillary node dissection scar contained possibly infected fluid, which was sent for laboratory analysis. No apparent complications. RECOMMENDATIONS: 1. Patient was placed on doxycycline, 100 mg p.o. b.i.d. for 10 days. 2. Recommend clinical follow-up in 10-14 days for reassessment. If the symptoms of infection have not resolved, consider an additional antibiotic course as well as repeat sonographic evaluation to assess for a recurrent collection.  Electronically Signed: By: Lajean Manes M.D. On: 05/19/2018 08:16    ELIGIBLE FOR AVAILABLE RESEARCH PROTOCOL: no  ASSESSMENT: 47 y.o. [place], [name] woman status post right breast upper outer quadrant biopsy 01/11/2018 for a clinical T1 N0, stage IA invasive ductal carcinoma, grade 3, estrogen and progesterone receptor positive, HER-2 amplified, with an MIB-1 of 30%.  (1) status post right lumpectomy and sentinel lymph node sampling 02/23/2018 for a pT2 pN1, stage IB invasive ductal carcinoma, grade 3, with close but negative margins  (2) adjuvant chemotherapy consisting of carboplatin, docetaxel, trastuzumab and Pertuzumab every 21 days x 6 started 03/08/2018  (a) pertuzumab stopped after cycle 1 due to diarrhea  (b) docetaxel changed to gemcitabine after cycle 2 due to persistent hyperglycemia  (c) Gemcitabine and Carboplatin dose reduced by approximately 10% due to delayed neutropenia and thrombocytopenia  (3) anti-HER-2 immunotherapy to start concurrently with  chemotherapy  (a) baseline echocardiogram on 02/07/2018 showed an ejection fraction in the 55-60% range  (4) adjuvant radiation to follow  (5) antiestrogens to follow at the completion of local treatment  (6) genetics testing through [facility] Multi-cancer and Breast panel on 01/13/2018 showed: no deleterious mutations. The following genes were evaluated for sequence changes and exonic deletions/duplications: ALK, APC, ATM, AXIN2, BAP1, BARD1, BLM, BMPR1A, BRCA1, BRCA2, BRIP1, CASR, CDC73, CDH1, CDK4, CDKN1B, CDKN1C,CDKN2A (p14ARF), CDKN2A (p16INK4a), CEBPA, CHEK2, CTNNA1, DICER1, DIS3L2, EPCAM*, FH, FLCN, GATA2, GPC3, GREM1*, HRAS, KIT, MAX, MEN1, MET, MLH1, MSH2, MSH3, MSH6, MUTYH, NBN, NF1, NF2, PALB2, PDGFRA, PHOX2B*, PMS2, POLD1,Rowe, POT1, PRKAR1A, PTCH1, PTEN, RAD50, RAD51C, RAD51D, RB1, RECQL4, RET, RUNX1, SDHAF2, SDHB, SDHC, SDHD,SMAD4, SMARCA4, SMARCB1, SMARCE1, STK11, SUFU, TERC, TERT, TMEM127, TP53, TSC1, TSC2, VHL, WRN*, WT1.The following genes were evaluated for sequence changes only: EGFR*, HOXB13*, MITF*, NTHL1*, SDHA  PLAN: Loza is doing well today.  Her labs have improved and she will proceed with her fifth cycle of adjuvant chemotherapy with Gemcitabine, Carboplatin, and Trastuzumab.  I do not think her rash is related to the chemo due to the delayed timing of it.  It is also resolving.  I reviewed her chemotherapy with Dr. Jana Hakim and we will dose reduce her with this cycle by 10% due to her delayed neutropenia and thrombocytopenia.  She will undergo Udenyca support on 11/15.  Cameren is scheduled with echo and f/u in cardiology on 06/19/18.  She will return in one week for labs,and f/u with [place] to review how she tolerated treatment.  A total of (30) minutes of face-to-face time was spent with this patient with greater than 50% of that time in counseling and care-coordination.   Wilber Bihari, NP 06/07/18 8:58 AM Medical Oncology and Hematology [facility] [address] [place], [place] [address] Tel. [phone]    Fax. [phone]

## 2018-06-07 NOTE — Telephone Encounter (Signed)
Made appts per 11/13 los.  Patient did not stop by scheduling, so called infusion to make sure she knows there were changes.  Per Amy, RN, they will print a new calendar for the patient.

## 2018-06-07 NOTE — Progress Notes (Signed)
Spoke with Mendel Ryder, NP. Okay to start patient's pre-meds for treatment today while she is reviewing chemo doses w/ Dr. Jana Hakim.   Demetrius Charity, PharmD, [place] [facility] Pharmac[facility] Pharmacy Phone: [phone] 06/07/2018

## 2018-06-07 NOTE — Patient Instructions (Signed)
[facility] Discharge Instructions for Patients Receiving Chemotherapy  Today you received the following chemotherapy agents: Herceptin, Gemzar, Carboplatin.  To help prevent nausea and vomiting after your treatment, we encourage you to take your nausea medication as prescribed.   If you develop nausea and vomiting that is not controlled by your nausea medication, call the clinic.   BELOW ARE SYMPTOMS THAT SHOULD BE REPORTED IMMEDIATELY:  *FEVER GREATER THAN 100.5 F  *CHILLS WITH OR WITHOUT FEVER  NAUSEA AND VOMITING THAT IS NOT CONTROLLED WITH YOUR NAUSEA MEDICATION  *UNUSUAL SHORTNESS OF BREATH  *UNUSUAL BRUISING OR BLEEDING  TENDERNESS IN MOUTH AND THROAT WITH OR WITHOUT PRESENCE OF ULCERS  *URINARY PROBLEMS  *BOWEL PROBLEMS  UNUSUAL RASH Items with * indicate a potential emergency and should be followed up as soon as possible.  Feel free to call the clinic should you have any questions or concerns. The clinic phone number is [phone].  Please show the [facility] at check-in to the Emergency Department and triage nurse.     Gemcitabine injection What is this medicine? GEMCITABINE (jem SIT a been) is a chemotherapy drug. This medicine is used to treat many types of cancer like breast cancer, lung cancer, pancreatic cancer, and ovarian cancer. This medicine may be used for other purposes; ask your health care provider or pharmacist if you have questions. COMMON BRAND NAME(S): Gemzar What should I tell my health care provider before I take this medicine? They need to know if you have any of these conditions: -blood disorders -infection -kidney disease -liver disease -recent or ongoing radiation therapy -an unusual or allergic reaction to gemcitabine, other chemotherapy, other medicines, foods, dyes, or preservatives -pregnant or trying to get pregnant -breast-feeding How should I use this medicine? This drug is given as an infusion into a  vein. It is administered in a hospital or clinic by a specially trained health care professional. Talk to your pediatrician regarding the use of this medicine in children. Special care may be needed. Overdosage: If you think you have taken too much of this medicine contact a poison control center or emergency room at once. NOTE: This medicine is only for you. Do not share this medicine with others. What if I miss a dose? It is important not to miss your dose. Call your doctor or health care professional if you are unable to keep an appointment. What may interact with this medicine? -medicines to increase blood counts like filgrastim, pegfilgrastim, sargramostim -some other chemotherapy drugs like cisplatin -vaccines Talk to your doctor or health care professional before taking any of these medicines: -acetaminophen -aspirin -ibuprofen -ketoprofen -naproxen This list may not describe all possible interactions. Give your health care provider a list of all the medicines, herbs, non-prescription drugs, or dietary supplements you use. Also tell them if you smoke, drink alcohol, or use illegal drugs. Some items may interact with your medicine. What should I watch for while using this medicine? Visit your doctor for checks on your progress. This drug may make you feel generally unwell. This is not uncommon, as chemotherapy can affect healthy cells as well as cancer cells. Report any side effects. Continue your course of treatment even though you feel ill unless your doctor tells you to stop. In some cases, you may be given additional medicines to help with side effects. Follow all directions for their use. Call your doctor or health care professional for advice if you get a fever, chills or sore throat, or other  symptoms of a cold or flu. Do not treat yourself. This drug decreases your body's ability to fight infections. Try to avoid being around people who are sick. This medicine may increase your  risk to bruise or bleed. Call your doctor or health care professional if you notice any unusual bleeding. Be careful brushing and flossing your teeth or using a toothpick because you may get an infection or bleed more easily. If you have any dental work done, tell your dentist you are receiving this medicine. Avoid taking products that contain aspirin, acetaminophen, ibuprofen, naproxen, or ketoprofen unless instructed by your doctor. These medicines may hide a fever. Women should inform their doctor if they wish to become pregnant or think they might be pregnant. There is a potential for serious side effects to an unborn child. Talk to your health care professional or pharmacist for more information. Do not breast-feed an infant while taking this medicine. What side effects may I notice from receiving this medicine? Side effects that you should report to your doctor or health care professional as soon as possible: -allergic reactions like skin rash, itching or hives, swelling of the face, lips, or tongue -low blood counts - this medicine may decrease the number of white blood cells, red blood cells and platelets. You may be at increased risk for infections and bleeding. -signs of infection - fever or chills, cough, sore throat, pain or difficulty passing urine -signs of decreased platelets or bleeding - bruising, pinpoint red spots on the skin, black, tarry stools, blood in the urine -signs of decreased red blood cells - unusually weak or tired, fainting spells, lightheadedness -breathing problems -chest pain -mouth sores -nausea and vomiting -pain, swelling, redness at site where injected -pain, tingling, numbness in the hands or feet -stomach pain -swelling of ankles, feet, hands -unusual bleeding Side effects that usually do not require medical attention (report to your doctor or health care professional if they continue or are bothersome): -constipation -diarrhea -hair loss -loss of  appetite -stomach upset This list may not describe all possible side effects. Call your doctor for medical advice about side effects. You may report side effects to FDA at 1-800-FDA-1088. Where should I keep my medicine? This drug is given in a hospital or clinic and will not be stored at home. NOTE: This sheet is a summary. It may not cover all possible information. If you have questions about this medicine, talk to your doctor, pharmacist, or health care provider.  2018 Elsevier/Gold Standard (2007-11-21 18:45:54)   Neutropenia Neutropenia is a condition that occurs when you have a lower-than-normal level of a type of white blood cell (neutrophil) in your body. Neutrophils are made in the spongy center of large bones (bone marrow) and they fight infections. Neutrophils are your body's main defense against bacterial and fungal infections. The fewer neutrophils you have and the longer your body remains without them, the greater your risk of getting a severe infection. What are the causes? This condition can occur if your body uses up or destroys neutrophils faster than your bone marrow can make them. This problem may happen because of:  Bacterial or fungal infection.  Allergic disorders.  Reactions to some medicines.  Autoimmune disease.  An enlarged spleen.  This condition can also occur if your bone marrow does not produce enough neutrophils. This problem may be caused by:  Cancer.  Cancer treatments, such as radiation or chemotherapy.  Viral infections.  Medicines, such as phenytoin.  Vitamin B12 deficiency.  Diseases  of the bone marrow.  Environmental toxins, such as insecticides.  What are the signs or symptoms? This condition does not usually cause symptoms. If symptoms are present, they are usually caused by an underlying infection. Symptoms of an infection may include:  Fever.  Chills.  Swollen glands.  Oral or anal ulcers.  Cough and shortness of  breath.  Rash.  Skin infection.  Fatigue.  How is this diagnosed? Your health care provider may suspect neutropenia if you have:  A condition that may cause neutropenia.  Symptoms of infection, especially fever.  Frequent and unusual infections.  You will have a medical history and physical exam. Tests will also be done, such as:  A complete blood count (CBC).  A procedure to collect a sample of bone marrow for examination (bone marrow biopsy).  A chest X-ray.  A urine culture.  A blood culture.  How is this treated? Treatment depends on the underlying cause and severity of your condition. Mild neutropenia may not require treatment. Treatment may include medicines, such as:  Antibiotic medicine given through an IV tube.  Antiviral medicines.  Antifungal medicines.  A medicine to increase neutrophil production (colony-stimulating factor). You may get this drug through an IV tube or by injection.  Steroids given through an IV tube.  If an underlying condition is causing neutropenia, you may need treatment for that condition. If medicines you are taking are causing neutropenia, your health care provider may have you stop taking those medicines. Follow these instructions at home: Medicines  Take over-the-counter and prescription medicines only as told by your health care provider.  Get a seasonal flu shot (influenza vaccine). Lifestyle  Do not eat unpasteurized foods.Do not eat unwashed raw fruits or vegetables.  Avoid exposure to groups of people or children.  Avoid being around people who are sick.  Avoid being around dirt or dust, such as in construction areas or gardens.  Do not provide direct care for pets. Avoid animal droppings. Do not clean litter boxes and bird cages. Hygiene   Bathe daily.  Clean the area between the genitals and the anus (perineal area) after you urinate or have a bowel movement. If you are female, wipe from front to  back.  Brush your teeth with a soft toothbrush before and after meals.  Do not use a razor that has a blade. Use an electric razor to remove hair.  Wash your hands often. Make sure others who come in contact with you also wash their hands. If soap and water are not available, use hand sanitizer. General instructions  Do not have sex unless your health care provider has approved.  Take actions to avoid cuts and burns. For example: ? Be cautious when you use knives. Always cut away from yourself. ? Keep knives in protective sheaths or guards when not in use. ? Use oven mitts when you cook with a hot stove, oven, or grill. ? Stand a safe distance away from open fires.  Avoid people who received a vaccine in the past 30 days if that vaccine contained a live version of the germ (live vaccine). You should not get a live vaccine. Common live vaccines are varicella, measles, mumps, and rubella.  Do not share food utensils.  Do not use tampons, enemas, or rectal suppositories unless your health care provider has approved.  Keep all appointments as told by your health care provider. This is important. Contact a health care provider if:  You have a fever.  You have  chills or you start to shake.  You have: ? A sore throat. ? A warm, red, or tender area on your skin. ? A cough. ? Frequent or painful urination. ? Vaginal discharge or itching.  You develop: ? Sores in your mouth or anus. ? Swollen lymph nodes. ? Red streaks on the skin. ? A rash.  You feel: ? Nauseous or you vomit. ? Very fatigued. ? Short of breath. This information is not intended to replace advice given to you by your health care provider. Make sure you discuss any questions you have with your health care provider. Document Released: 01/01/2002 Document Revised: 12/18/2015 Document Reviewed: 01/22/2015 Elsevier Interactive Patient Education  [employer].

## 2018-06-09 ENCOUNTER — Encounter: Payer: Self-pay | Admitting: Oncology

## 2018-06-09 ENCOUNTER — Inpatient Hospital Stay: Payer: BC Managed Care – PPO

## 2018-06-09 VITALS — BP 129/74 | HR 102 | Temp 98.7°F | Resp 18

## 2018-06-09 DIAGNOSIS — C50411 Malignant neoplasm of upper-outer quadrant of right female breast: Secondary | ICD-10-CM | POA: Diagnosis not present

## 2018-06-09 DIAGNOSIS — Z170 Estrogen receptor positive status [ER+]: Secondary | ICD-10-CM

## 2018-06-09 MED ORDER — PEGFILGRASTIM-CBQV 6 MG/0.6ML ~~LOC~~ SOSY
PREFILLED_SYRINGE | SUBCUTANEOUS | Status: AC
  Filled 2018-06-09: qty 0.6

## 2018-06-09 MED ORDER — PEGFILGRASTIM-CBQV 6 MG/0.6ML ~~LOC~~ SOSY
6.0000 mg | PREFILLED_SYRINGE | Freq: Once | SUBCUTANEOUS | Status: AC
  Administered 2018-06-09: 6 mg via SUBCUTANEOUS

## 2018-06-09 NOTE — Progress Notes (Signed)
Enrolled patient on 05/31/18 in copay assistance program for Herceptin and Perjeta through [employer].  Patient approved for $25,000 for Herceptin and $25,000 for Perjeta leaving her with a $5 copay per drug after insurance pays. She has a copy of approval letters as well for her records. Lenise has a copy of for billing/copay purposes to submit to [facility] when patient has been billed a copay for these two drugs.  She has my card for any additional financial questions or concerns.

## 2018-06-12 ENCOUNTER — Ambulatory Visit (INDEPENDENT_AMBULATORY_CARE_PROVIDER_SITE_OTHER): Payer: BC Managed Care – PPO | Admitting: Women's Health

## 2018-06-12 ENCOUNTER — Encounter: Payer: Self-pay | Admitting: Women's Health

## 2018-06-12 VITALS — BP 142/90 | Ht 62.0 in | Wt 248.2 lb

## 2018-06-12 DIAGNOSIS — Z01419 Encounter for gynecological examination (general) (routine) without abnormal findings: Secondary | ICD-10-CM

## 2018-06-12 MED ORDER — VALACYCLOVIR HCL 500 MG PO TABS: ORAL_TABLET | ORAL | 12 refills | Status: DC

## 2018-06-12 NOTE — Patient Instructions (Signed)
Health Maintenance for Postmenopausal Women Menopause is a normal process in which your reproductive ability comes to an end. This process happens gradually over a span of months to years, usually between the ages of 22 and 9. Menopause is complete when you have missed 12 consecutive menstrual periods. It is important to talk with your health care provider about some of the most common conditions that affect postmenopausal women, such as heart disease, cancer, and bone loss (osteoporosis). Adopting a healthy lifestyle and getting preventive care can help to promote your health and wellness. Those actions can also lower your chances of developing some of these common conditions. What should I know about menopause? During menopause, you may experience a number of symptoms, such as:  Moderate-to-severe hot flashes.  Night sweats.  Decrease in sex drive.  Mood swings.  Headaches.  Tiredness.  Irritability.  Memory problems.  Insomnia.  Choosing to treat or not to treat menopausal changes is an individual decision that you make with your health care provider. What should I know about hormone replacement therapy and supplements? Hormone therapy products are effective for treating symptoms that are associated with menopause, such as hot flashes and night sweats. Hormone replacement carries certain risks, especially as you become older. If you are thinking about using estrogen or estrogen with progestin treatments, discuss the benefits and risks with your health care provider. What should I know about heart disease and stroke? Heart disease, heart attack, and stroke become more likely as you age. This may be due, in part, to the hormonal changes that your body experiences during menopause. These can affect how your body processes dietary fats, triglycerides, and cholesterol. Heart attack and stroke are both medical emergencies. There are many things that you can do to help prevent heart disease  and stroke:  Have your blood pressure checked at least every 1-2 years. High blood pressure causes heart disease and increases the risk of stroke.  If you are 49-22 years old, ask your health care provider if you should take aspirin to prevent a heart attack or a stroke.  Do not use any tobacco products, including cigarettes, chewing tobacco, or electronic cigarettes. If you need help quitting, ask your health care provider.  It is important to eat a healthy diet and maintain a healthy weight. ? Be sure to include plenty of vegetables, fruits, low-fat dairy products, and lean protein. ? Avoid eating foods that are high in solid fats, added sugars, or salt (sodium).  Get regular exercise. This is one of the most important things that you can do for your health. ? Try to exercise for at least 150 minutes each week. The type of exercise that you do should increase your heart rate and make you sweat. This is known as moderate-intensity exercise. ? Try to do strengthening exercises at least twice each week. Do these in addition to the moderate-intensity exercise.  Know your numbers.Ask your health care provider to check your cholesterol and your blood glucose. Continue to have your blood tested as directed by your health care provider.  What should I know about cancer screening? There are several types of cancer. Take the following steps to reduce your risk and to catch any cancer development as early as possible. Breast Cancer  Practice breast self-awareness. ? This means understanding how your breasts normally appear and feel. ? It also means doing regular breast self-exams. Let your health care provider know about any changes, no matter how small.  If you are 49  or older, have a clinician do a breast exam (clinical breast exam or CBE) every year. Depending on your age, family history, and medical history, it may be recommended that you also have a yearly breast X-ray (mammogram).  If you  have a family history of breast cancer, talk with your health care provider about genetic screening.  If you are at high risk for breast cancer, talk with your health care provider about having an MRI and a mammogram every year.  Breast cancer (BRCA) gene test is recommended for women who have family members with BRCA-related cancers. Results of the assessment will determine the need for genetic counseling and BRCA1 and for BRCA2 testing. BRCA-related cancers include these types: ? Breast. This occurs in males or females. ? Ovarian. ? Tubal. This may also be called fallopian tube cancer. ? Cancer of the abdominal or pelvic lining (peritoneal cancer). ? Prostate. ? Pancreatic.  Cervical, Uterine, and Ovarian Cancer Your health care provider may recommend that you be screened regularly for cancer of the pelvic organs. These include your ovaries, uterus, and vagina. This screening involves a pelvic exam, which includes checking for microscopic changes to the surface of your cervix (Pap test).  For women ages 49-65, health care providers may recommend a pelvic exam and a Pap test every three years. For women ages 49-65, they may recommend the Pap test and pelvic exam, combined with testing for human papilloma virus (HPV), every five years. Some types of HPV increase your risk of cervical cancer. Testing for HPV may also be done on women of any age who have unclear Pap test results.  Other health care providers may not recommend any screening for nonpregnant women who are considered low risk for pelvic cancer and have no symptoms. Ask your health care provider if a screening pelvic exam is right for you.  If you have had past treatment for cervical cancer or a condition that could lead to cancer, you need Pap tests and screening for cancer for at least 20 years after your treatment. If Pap tests have been discontinued for you, your risk factors (such as having a new sexual partner) need to be  reassessed to determine if you should start having screenings again. Some women have medical problems that increase the chance of getting cervical cancer. In these cases, your health care provider may recommend that you have screening and Pap tests more often.  If you have a family history of uterine cancer or ovarian cancer, talk with your health care provider about genetic screening.  If you have vaginal bleeding after reaching menopause, tell your health care provider.  There are currently no reliable tests available to screen for ovarian cancer.  Lung Cancer Lung cancer screening is recommended for adults 69-62 years old who are at high risk for lung cancer because of a history of smoking. A yearly low-dose CT scan of the lungs is recommended if you:  Currently smoke.  Have a history of at least 30 pack-years of smoking and you currently smoke or have quit within the past 15 years. A pack-year is smoking an average of one pack of cigarettes per day for one year.  Yearly screening should:  Continue until it has been 15 years since you quit.  Stop if you develop a health problem that would prevent you from having lung cancer treatment.  Colorectal Cancer  This type of cancer can be detected and can often be prevented.  Routine colorectal cancer screening usually begins at  age 42 and continues through age 45.  If you have risk factors for colon cancer, your health care provider may recommend that you be screened at an earlier age.  If you have a family history of colorectal cancer, talk with your health care provider about genetic screening.  Your health care provider may also recommend using home test kits to check for hidden blood in your stool.  A small camera at the end of a tube can be used to examine your colon directly (sigmoidoscopy or colonoscopy). This is done to check for the earliest forms of colorectal cancer.  Direct examination of the colon should be repeated every  5-10 years until age 71. However, if early forms of precancerous polyps or small growths are found or if you have a family history or genetic risk for colorectal cancer, you may need to be screened more often.  Skin Cancer  Check your skin from head to toe regularly.  Monitor any moles. Be sure to tell your health care provider: ? About any new moles or changes in moles, especially if there is a change in a mole's shape or color. ? If you have a mole that is larger than the size of a pencil eraser.  If any of your family members has a history of skin cancer, especially at a young age, talk with your health care provider about genetic screening.  Always use sunscreen. Apply sunscreen liberally and repeatedly throughout the day.  Whenever you are outside, protect yourself by wearing long sleeves, pants, a wide-brimmed hat, and sunglasses.  What should I know about osteoporosis? Osteoporosis is a condition in which bone destruction happens more quickly than new bone creation. After menopause, you may be at an increased risk for osteoporosis. To help prevent osteoporosis or the bone fractures that can happen because of osteoporosis, the following is recommended:  If you are 46-71 years old, get at least 1,000 mg of calcium and at least 600 mg of vitamin D per day.  If you are older than age 55 but younger than age 65, get at least 1,200 mg of calcium and at least 600 mg of vitamin D per day.  If you are older than age 54, get at least 1,200 mg of calcium and at least 800 mg of vitamin D per day.  Smoking and excessive alcohol intake increase the risk of osteoporosis. Eat foods that are rich in calcium and vitamin D, and do weight-bearing exercises several times each week as directed by your health care provider. What should I know about how menopause affects my mental health? Depression may occur at any age, but it is more common as you become older. Common symptoms of depression  include:  Low or sad mood.  Changes in sleep patterns.  Changes in appetite or eating patterns.  Feeling an overall lack of motivation or enjoyment of activities that you previously enjoyed.  Frequent crying spells.  Talk with your health care provider if you think that you are experiencing depression. What should I know about immunizations? It is important that you get and maintain your immunizations. These include:  Tetanus, diphtheria, and pertussis (Tdap) booster vaccine.  Influenza every year before the flu season begins.  Pneumonia vaccine.  Shingles vaccine.  Your health care provider may also recommend other immunizations. This information is not intended to replace advice given to you by your health care provider. Make sure you discuss any questions you have with your health care provider. Document Released: 09/03/2005  Document Revised: 01/30/2016 Document Reviewed: 04/15/2015 Elsevier Interactive Patient Education  2018 [employer]  

## 2018-06-12 NOTE — Progress Notes (Signed)
Kelly Rowe 06-30-1971 [id number]    History:    Presents for annual exam.  2011 TAH for fibroids.  01/2018 right breast cancer lumpectomy, and continues with chemotherapy which should and this month with radiation starting next month in January.Marland Kitchen  Has had some difficulties with the chemo with nausea/vomiting and diarrhea doing better now.  Normal Pap history.  2011 diagnosed with HIV doing well with undetectable viral load,  normal Paps since diagnosed.  Primary care manages hypertension, diabetes, anxiety/depression and hypercholesteremia.  Currently not sexually active.  Having numerous hot flushes.  Had flu vaccine prior to starting chemo.  Past medical history, past surgical history, family history and social history were all reviewed and documented in the EPIC chart.  5 children all doing well.  School bus driver.  Mother diabetes.  ROS:  A ROS was performed and pertinent positives and negatives are included.  Exam:  Vitals:   06/12/18 1018  Weight: 248 lb 3.2 oz (112.6 kg)  Height: 5\' 2"  (1.575 m)   Body mass index is 45.4 kg/m.   General appearance:  Normal Thyroid:  Symmetrical, normal in size, without palpable masses or nodularity. Respiratory  Auscultation:  Clear without wheezing or rhonchi Cardiovascular  Auscultation:  Regular rate, without rubs, murmurs or gallops  Edema/varicosities:  Not grossly evident Abdominal  Soft,nontender, without masses, guarding or rebound.  Liver/spleen:  No organomegaly noted  Hernia:  None appreciated  Skin  Inspection:  Grossly normal, loss of all hair   Breasts: Examined lying and sitting.     Right: Without masses, retractions, discharge or axillary adenopathy.     Left: Without masses, retractions, discharge or axillary adenopathy. Gentitourinary   Inguinal/mons:  Normal without inguinal adenopathy  External genitalia:  Normal  BUS/Urethra/Skene's glands:  Normal  Vagina:  Normal  Cervix: And uterus  absent adnexa/parametria:     Rt: Without masses or tenderness.   Lt: Without masses or tenderness.  Anus and perineum: Normal  Digital rectal exam: Normal sphincter tone without palpated masses or tenderness  Assessment/Plan:  47 y.o. SBF G7, P5 for annual exam.     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  SBE's, annual screening mammogram after completed with treatment.  Reviewed importance of increasing regular daily walking, decreasing calorie/carbs.  Infection prevention discussed.  Is currently off work as a Teacher, early years/pre.  Ultrex 500 twice daily for 3 to 5 days as needed prescription, proper use given.      Kelly Rowe [facility], 10:21 AM 06/12/2018

## 2018-06-14 ENCOUNTER — Other Ambulatory Visit: Payer: Self-pay | Admitting: *Deleted

## 2018-06-14 ENCOUNTER — Inpatient Hospital Stay (HOSPITAL_BASED_OUTPATIENT_CLINIC_OR_DEPARTMENT_OTHER): Payer: BC Managed Care – PPO | Admitting: Adult Health

## 2018-06-14 ENCOUNTER — Telehealth: Payer: Self-pay | Admitting: Adult Health

## 2018-06-14 ENCOUNTER — Inpatient Hospital Stay: Payer: BC Managed Care – PPO

## 2018-06-14 ENCOUNTER — Encounter: Payer: Self-pay | Admitting: Adult Health

## 2018-06-14 VITALS — BP 126/86 | HR 87 | Temp 98.3°F | Resp 20 | Ht 62.0 in | Wt 248.3 lb

## 2018-06-14 DIAGNOSIS — B20 Human immunodeficiency virus [HIV] disease: Secondary | ICD-10-CM

## 2018-06-14 DIAGNOSIS — E876 Hypokalemia: Secondary | ICD-10-CM | POA: Diagnosis not present

## 2018-06-14 DIAGNOSIS — Z95828 Presence of other vascular implants and grafts: Secondary | ICD-10-CM

## 2018-06-14 DIAGNOSIS — Z170 Estrogen receptor positive status [ER+]: Secondary | ICD-10-CM | POA: Diagnosis not present

## 2018-06-14 DIAGNOSIS — C50411 Malignant neoplasm of upper-outer quadrant of right female breast: Secondary | ICD-10-CM

## 2018-06-14 DIAGNOSIS — I10 Essential (primary) hypertension: Secondary | ICD-10-CM

## 2018-06-14 DIAGNOSIS — C773 Secondary and unspecified malignant neoplasm of axilla and upper limb lymph nodes: Secondary | ICD-10-CM

## 2018-06-14 DIAGNOSIS — C50911 Malignant neoplasm of unspecified site of right female breast: Secondary | ICD-10-CM

## 2018-06-14 LAB — CBC WITH DIFFERENTIAL (CANCER CENTER ONLY)
Abs Immature Granulocytes: 0.04 10*3/uL (ref 0.00–0.07)
Basophils Absolute: 0 10*3/uL (ref 0.0–0.1)
Basophils Relative: 0 %
Eosinophils Absolute: 0 10*3/uL (ref 0.0–0.5)
Eosinophils Relative: 0 %
HCT: 27.6 % — ABNORMAL LOW (ref 36.0–46.0)
Hemoglobin: 8.7 g/dL — ABNORMAL LOW (ref 12.0–15.0)
Immature Granulocytes: 1 %
Lymphocytes Relative: 40 %
Lymphs Abs: 1.9 10*3/uL (ref 0.7–4.0)
MCH: 29.7 pg (ref 26.0–34.0)
MCHC: 31.5 g/dL (ref 30.0–36.0)
MCV: 94.2 fL (ref 80.0–100.0)
Monocytes Absolute: 0.3 10*3/uL (ref 0.1–1.0)
Monocytes Relative: 7 %
Neutro Abs: 2.5 10*3/uL (ref 1.7–7.7)
Neutrophils Relative %: 52 %
Platelet Count: 205 10*3/uL (ref 150–400)
RBC: 2.93 MIL/uL — ABNORMAL LOW (ref 3.87–5.11)
RDW: 18 % — ABNORMAL HIGH (ref 11.5–15.5)
WBC Count: 4.8 10*3/uL (ref 4.0–10.5)
nRBC: 0 % (ref 0.0–0.2)

## 2018-06-14 LAB — CMP (CANCER CENTER ONLY)
ALT: 28 U/L (ref 0–44)
AST: 24 U/L (ref 15–41)
Albumin: 3.5 g/dL (ref 3.5–5.0)
Alkaline Phosphatase: 173 U/L — ABNORMAL HIGH (ref 38–126)
Anion gap: 11 (ref 5–15)
BUN: 7 mg/dL (ref 6–20)
CO2: 29 mmol/L (ref 22–32)
Calcium: 8.6 mg/dL — ABNORMAL LOW (ref 8.9–10.3)
Chloride: 99 mmol/L (ref 98–111)
Creatinine: 0.78 mg/dL (ref 0.44–1.00)
GFR, Est AFR Am: >60 mL/min (ref >60)
GFR, Estimated: >60 mL/min (ref >60)
Glucose, Bld: 155 mg/dL — ABNORMAL HIGH (ref 70–99)
Potassium: 3.2 mmol/L — ABNORMAL LOW (ref 3.5–5.1)
Sodium: 139 mmol/L (ref 135–145)
Total Bilirubin: 0.2 mg/dL — ABNORMAL LOW (ref 0.3–1.2)
Total Protein: 7.2 g/dL (ref 6.5–8.1)

## 2018-06-14 MED ORDER — POTASSIUM CHLORIDE CRYS ER 20 MEQ PO TBCR: 20.0000 meq | EXTENDED_RELEASE_TABLET | Freq: Every day | ORAL | 0 refills | Status: DC

## 2018-06-14 MED ORDER — HEPARIN SOD (PORK) LOCK FLUSH 100 UNIT/ML IV SOLN
500.0000 [IU] | Freq: Once | INTRAVENOUS | Status: AC
  Administered 2018-06-14: 500 [IU]
  Filled 2018-06-14: qty 5

## 2018-06-14 MED ORDER — SODIUM CHLORIDE 0.9% FLUSH
10.0000 mL | Freq: Once | INTRAVENOUS | Status: AC
  Administered 2018-06-14: 10 mL
  Filled 2018-06-14: qty 10

## 2018-06-14 NOTE — Progress Notes (Signed)
[place]  [phone] [phone]) [phone]     ID: Kelly Rowe DOB: 1971-03-21  MR#: [id number]  OIN#:[id number]  Patient Care Team: Biagio Borg, MD as PCP - [name], MD as Consulting Physician (General Surgery) [name], [name], MD as Consulting Physician (Oncology) [name], MD as Attending Physician (Radiation Oncology) [name], NP as Nurse Practitioner (Obstetrics and Gynecology) Marlinda Mike, PA-C as Referring Physician (Physician Assistant) OTHER MD:   CHIEF COMPLAINT: Triple positive breast cancer  CURRENT TREATMENT: adjuvant chemotherapy   HISTORY OF CURRENT ILLNESS: From the original intake note:  "Kelly Rowe" noticed bruising in her lateral right breast and palpated a mass  on 12/23/2017. She followed up with her gynecologist. She underwent unilateral right diagnostic mammography with tomography and right breast ultrasonography at [facility] on 01/05/2018 showing: breast density category B. There is an irregular highly suspicious mass within the right breast at the 9:30 o'clock upper outer quadrant, measuring 1.8 x 1.1 x 1.5 cm, and located 18 cm from the nipple,  Ultrasonography revealed a single morphologically abnormal lymph node in the RIGHT axilla, with cortical thickness of 6 mm.   Accordingly on 01/11/2018 she proceeded to biopsy of the right breast area in question as well as a suspicious lymph node. The pathology from this procedure showed ([id number]): Invasive ductal carcinoma grade III.  The lymph node biopsied was negative for carcinoma (concordant).. Prognostic indicators significant for: estrogen receptor, 60% positive with weak staining intensity and progesterone receptor, 10% positive with strong staining intensity. Proliferation marker Ki67 at 30%. HER2 amplified with ratios HER2/CEP17 signals 2.26 and average HER2 copies per cell 3.50  The patient's subsequent history is as detailed  below.  INTERVAL HISTORY: Kelly Rowe returns today for follow up and treatment of her triple positive breast cancer. She is receiving adjuvant chemotherapy with Gemcitabine (previously Docetaxel), Carboplatin, and Trastuzumab,  given on day 1 of every 21 day cycle. She receives [place] support on day 3. Today is day 8 cycle 5. Her chemotherapy was dose reduced last week because it had been held from the week prior due to delayed neutropenia.    REVIEW OF SYSTEMS: Kelly Rowe is doing well today.  She has not felt nauseated after this cycle.  She had one loose bowel movement.  She has had issues with hypokalemia, and is eating an increased potassium diet.  Her potassium is 3.2 today.  She is doing well otherwise today.  A detailed ROS is otherwise non contributory.     PAST MEDICAL HISTORY: Past Medical History:  Diagnosis Date  . ANEMIA-IRON DEFICIENCY 01/30/2010  . ANXIETY 11/27/2007  . Arthritis   . Asthma 02/25/2011  . Cancer ([place])    breast  . Carbuncle and furuncle of trunk 04/16/2010  . Chlamydia infection 03/21/2008  . DIABETES MELLITUS, TYPE II 08/02/2007  . Edema 01/30/2010  . ELEVATED BLOOD PRESSURE WITHOUT DIAGNOSIS OF HYPERTENSION 08/02/2007  . Family history of breast cancer   . Family history of colon cancer   . Family history of lung cancer   . FREQUENCY, URINARY 05/19/2009  . GENITAL HERPES 03/12/2009  . GERD (gastroesophageal reflux disease)   . History of kidney stones   . HIV (human immunodeficiency virus infection) ([place]) 2009  . HIV INFECTION 03/12/2009  . HSV (herpes simplex virus) infection   . HYPERLIPIDEMIA 08/02/2007  . Hypertension   . Metrorrhagia 03/21/2008  . PONV (postoperative nausea and vomiting)    woke up crying per patient   .  RETENTION, URINE 05/19/2009  . Trichomonas infection 01/19/2010  . VITAMIN D DEFICIENCY 01/30/2010    PAST SURGICAL HISTORY: Past Surgical History:  Procedure Laterality Date  . ABDOMINAL HYSTERECTOMY  07/22/2010   TAH WITH PRESERVATION  OF BOTH TUBES AND OVARIES  . BREAST LUMPECTOMY WITH RADIOACTIVE SEED AND SENTINEL LYMPH NODE BIOPSY Right 02/23/2018   Procedure: BREAST LUMPECTOMY WITH RADIOACTIVE SEED AND SENTINEL LYMPH NODE BIOPSY;  Surgeon: Stark Klein, MD;  Location: [facility];  Service: General;  Laterality: Right;  . CHOLECYSTECTOMY    . ENDOMETRIAL ABLATION  01/11/2008   HER OPTION  . ESOPHAGOGASTRODUODENOSCOPY    . MULTIPLE TOOTH EXTRACTIONS    . PORTACATH PLACEMENT Left 02/23/2018   Procedure: INSERTION PORT-A-CATH;  Surgeon: Stark Klein, MD;  Location: [facility];  Service: General;  Laterality: Left;  . PORTACATH PLACEMENT N/A 04/21/2018   Procedure: PORT-A-CATH REVISION;  Surgeon: Stark Klein, MD;  Location: [facility];  Service: General;  Laterality: N/A;  . TUBAL LIGATION    . WISDOM TOOTH EXTRACTION      FAMILY HISTORY Family History  Problem Relation Age of Onset  . Prostate cancer Other   . Heart disease Other   . Stroke Other   . Diabetes Mother   . Hypertension Mother   . Diabetes Father   . Cancer Father        COLON and LU NG  . Stroke Paternal Uncle   . Breast cancer Paternal Aunt   . Lung cancer Maternal Grandmother 41       Mesothelioma  . Colon cancer Paternal Grandfather        dx over 31s  . Lung cancer Paternal Aunt   . Breast cancer Paternal Aunt   . Breast cancer Paternal Aunt   . Heart attack Maternal Grandfather 71  . Colon cancer Other        MGM's 5 brothers  The patient's father died at age 46 due to metastatic liver cancer. The patient's mother is alive at 81. The patient's has 1 brother and 3 sisters. There was a paternal grandfather with colon cancer. There were 3 paternal aunts with breast cancer, 2 diagnosed in the 71's and 1 at age 85. There was a maternal grandmother with mesothelioma. The patient otherwise denies a history of ovarian cancer in the family.    GYNECOLOGIC HISTORY:  Patient's last menstrual period was 06/21/2010. Menarche: 47 years old Age at first live birth:  47 years old She is GXP5. Her LMP was December 2011. She is status post partial hysterectomy without oophorectomy. She took oral contraception for 3 years with no complications. She never took HRT.    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  At home are 2 of her sons, Glendell Docker and [name], and the patient's grandson, Amador Cunas 23 (who is Willie's son). The patient's oldest is [place] age 45 who lives in [place] as a cook. Daughter, [place] age 4 works as a Scientist, water quality. Son, Glendell Docker age 75 is disabled. Son, [name] age 49 lives in [place] as a Microbiologist. Daughter, Benard Rink age 100 also is a Microbiologist. The patient has 5 grandchildren and no great grandchildren. The patient does not belong to a church.        ADVANCED DIRECTIVES: Not in place; at the 01/18/2018 visit the patient was given the appropriate documents to complete on notarized at her discretion   HEALTH MAINTENANCE: Social History   Tobacco Use  . Smoking status: Never Smoker  . Smokeless tobacco: Never Used  Substance Use Topics  . Alcohol use: Never    Alcohol/week: 0.0 standard drinks    Frequency: Never  . Drug use: No     Colonoscopy: Not yet  PAP: November 2018  Bone density: Never   Allergies  Allergen Reactions  . Levaquin [Levofloxacin In D5w] Nausea And Vomiting and Rash  . Penicillins Swelling and Rash    Has patient had a PCN reaction causing immediate rash, facial/tongue/throat swelling, SOB or lightheadedness with hypotension: Yes Has patient had a PCN reaction causing severe rash involving mucus membranes or skin necrosis: No Has patient had a PCN reaction that required hospitalization: Yes Has patient had a PCN reaction occurring within the last 10 years: No If all of the above answers are "NO", then may proceed with Cephalosporin use.    Current Outpatient Medications  Medication Sig Dispense Refill  . albuterol (PROVENTIL HFA;VENTOLIN HFA) 108 (90 Base) MCG/ACT inhaler Inhale 2 puffs into the lungs  every 6 (six) hours as needed for wheezing or shortness of breath.    Marland Kitchen aspirin 81 MG tablet Take 81 mg by mouth daily as needed (chest pain).     . Azelastine-Fluticasone (DYMISTA) 137-50 MCG/ACT SUSP Use as directed 1 spray each side twice per day as needed 23 g 5  . Beclomethasone Dipropionate (QNASL) 80 MCG/ACT AERS Place 2 sprays into the nose daily. 1 Inhaler 1  . bictegravir-emtricitabine-tenofovir AF (BIKTARVY) 50-200-25 MG TABS tablet Take 1 tablet by mouth daily.    . blood glucose meter kit and supplies Dispense based on patient and insurance preference. Use up to four times daily as directed. (FOR ICD-10 E10.9, E11.9). 1 each 0  . Diclofenac Sodium (PENNSAID) 2 % SOLN Place 1 application onto the skin 2 (two) times daily. (Patient taking differently: Place 1 application onto the skin 2 (two) times daily as needed. ) 1 Bottle 3  . Dulaglutide (TRULICITY) 1.5 IW/8.0HO SOPN 0.5 ml weekly SQ 6 mL 3  . famotidine (PEPCID) 20 MG tablet Take 1 tablet (20 mg total) by mouth 2 (two) times daily. (Patient taking differently: Take 20 mg by mouth daily as needed for heartburn. ) 60 tablet 3  . ferrous gluconate (IRON 27) 240 (27 FE) MG tablet Take 240 mg by mouth daily as needed (iron).     Marland Kitchen glipiZIDE (GLIPIZIDE XL) 5 MG 24 hr tablet Take 1 tablet (5 mg total) by mouth daily with breakfast. 90 tablet 3  . ibuprofen (ADVIL,MOTRIN) 800 MG tablet Take 1 tablet (800 mg total) by mouth every 8 (eight) hours as needed. (Patient taking differently: Take 800 mg by mouth daily as needed for moderate pain. ) 60 tablet 0  . lidocaine-prilocaine (EMLA) cream Apply to affected area once (Patient taking differently: Apply 1 application topically once. Apply to affected area once) 30 g 3  . loratadine (CLARITIN) 10 MG tablet Take 10 mg by mouth daily.    Marland Kitchen LORazepam (ATIVAN) 0.5 MG tablet Take 1 tablet (0.5 mg total) by mouth at bedtime as needed (Nausea or vomiting). 30 tablet 0  . metFORMIN (GLUCOPHAGE-XR) 500 MG  24 hr tablet Take 2 tablets (1,000 mg total) by mouth daily with breakfast. Annual appt is due must see provider for future refills 180 tablet 3  . Multiple Vitamin (MULTIVITAMIN WITH MINERALS) TABS tablet Take 1 tablet by mouth daily.    Marland Kitchen omeprazole (PRILOSEC) 40 MG capsule Take 1 capsule (40 mg total) by mouth daily. 30 capsule 0  . ondansetron (ZOFRAN) 8 MG  tablet Take 1 tablet (8 mg total) by mouth every 8 (eight) hours as needed for nausea or vomiting. 20 tablet 0  . oxyCODONE (OXY IR/ROXICODONE) 5 MG immediate release tablet Take 1-2 tablets (5-10 mg total) by mouth every 6 (six) hours as needed for moderate pain, severe pain or breakthrough pain. 30 tablet 0  . potassium chloride (K-DUR) 10 MEQ tablet Take 1 tablet (10 mEq total) by mouth daily. Take 40 meq BID x 2 days then 20 meq BID x 5 days 36 tablet 0  . prochlorperazine (COMPAZINE) 10 MG tablet Take 1 tablet (10 mg total) by mouth every 6 (six) hours as needed (Nausea or vomiting). 30 tablet 1  . valACYclovir (VALTREX) 500 MG tablet Take twice daily for 3-5 days 30 tablet 12  . zolpidem (AMBIEN) 10 MG tablet Take 1 tablet (10 mg total) by mouth at bedtime as needed for sleep. 90 tablet 1  . potassium chloride SA (K-DUR,KLOR-CON) 20 MEQ tablet Take 1 tablet (20 mEq total) by mouth daily for 7 days. 7 tablet 0   No current facility-administered medications for this visit.    Facility-Administered Medications Ordered in Other Visits  Medication Dose Route Frequency Provider Last Rate Last Dose  . 0.9 %  sodium chloride infusion   Intravenous Once [name], [name], MD      . sodium chloride 0.9 % 1,000 mL with potassium chloride 10 mEq infusion   Intravenous Once [name], [name], MD        OBJECTIVE:   Vitals:   06/14/18 0935  BP: 126/86  Pulse: 87  Resp: 20  Temp: 98.3 F (36.8 C)  SpO2: 100%     Body mass index is 45.41 kg/m.   Wt Readings from Last 3 Encounters:  06/14/18 248 lb 4.8 oz (112.6 kg)  06/12/18 248 lb  3.2 oz (112.6 kg)  06/07/18 250 lb 8 oz (113.6 kg)  ECOG FS: 1 - Symptomatic but completely ambulatory GENERAL: Patient is a well appearing female in no acute distress HEENT:  Sclerae anicteric.  Oropharynx clear and moist. No ulcerations or evidence of oropharyngeal candidiasis. Neck is supple.  NODES:  No cervical, supraclavicular, or axillary lymphadenopathy palpated.  BREAST EXAM:   right axilla has healed, looks much improved. LUNGS:  Clear to auscultation bilaterally.  No wheezes or rhonchi. HEART:  Regular rate and rhythm. No murmur appreciated. ABDOMEN:  Soft, nontender.  Positive, normoactive bowel sounds. No organomegaly palpated. MSK:  No focal spinal tenderness to palpation. EXTREMITIES:  No peripheral edema.   SKIN:  Clear with no obvious rashes or skin changes. No nail dyscrasia. NEURO:  Nonfocal. Well oriented.  Appropriate affect.     LAB RESULTS:  CMP     Component Value Date/Time   NA 139 06/14/2018 0900   K 3.2 (L) 06/14/2018 0900   CL 99 06/14/2018 0900   CO2 29 06/14/2018 0900   GLUCOSE 155 (H) 06/14/2018 0900   BUN 7 06/14/2018 0900   CREATININE 0.78 06/14/2018 0900   CALCIUM 8.6 (L) 06/14/2018 0900   PROT 7.2 06/14/2018 0900   ALBUMIN 3.5 06/14/2018 0900   AST 24 06/14/2018 0900   ALT 28 06/14/2018 0900   ALKPHOS 173 (H) 06/14/2018 0900   BILITOT 0.2 (L) 06/14/2018 0900   GFRNONAA >60 06/14/2018 0900   GFRAA >60 06/14/2018 0900    No results found for: TOTALPROTELP, ALBUMINELP, A1GS, A2GS, BETS, BETA2SER, GAMS, MSPIKE, SPEI  No results found for: KPAFRELGTCHN, LAMBDASER, KAPLAMBRATIO  Lab Results  Component Value Date   WBC 4.8 06/14/2018   NEUTROABS PENDING 06/14/2018   HGB 8.7 (L) 06/14/2018   HCT 27.6 (L) 06/14/2018   MCV 94.2 06/14/2018   PLT 205 06/14/2018    _0 @  No results found for: LABCA2  No components found for: [id number]  No results for input(s): INR in the last 168 hours.  No results found for: LABCA2  No  results found for: ZJI967  No results found for: ELF810  No results found for: FBP102  No results found for: CA2729  No components found for: HGQUANT  No results found for: CEA1 / No results found for: CEA1   No results found for: AFPTUMOR  No results found for: [place]  No results found for: PSA1  Appointment on 06/14/2018  Component Date Value Ref Range Status  . Sodium 06/14/2018 139  135 - 145 mmol/L Final  . Potassium 06/14/2018 3.2* 3.5 - 5.1 mmol/L Final  . Chloride 06/14/2018 99  98 - 111 mmol/L Final  . CO2 06/14/2018 29  22 - 32 mmol/L Final  . Glucose, Bld 06/14/2018 155* 70 - 99 mg/dL Final  . BUN 06/14/2018 7  6 - 20 mg/dL Final  . Creatinine 06/14/2018 0.78  0.44 - 1.00 mg/dL Final  . Calcium 06/14/2018 8.6* 8.9 - 10.3 mg/dL Final  . Total Protein 06/14/2018 7.2  6.5 - 8.1 g/dL Final  . Albumin 06/14/2018 3.5  3.5 - 5.0 g/dL Final  . AST 06/14/2018 24  15 - 41 U/L Final  . ALT 06/14/2018 28  0 - 44 U/L Final  . Alkaline Phosphatase 06/14/2018 173* 38 - 126 U/L Final  . Total Bilirubin 06/14/2018 0.2* 0.3 - 1.2 mg/dL Final  . GFR, Est Non Af Am 06/14/2018 >60  >60 mL/min Final  . GFR, Est AFR Am 06/14/2018 >60  >60 mL/min Final   Comment: (NOTE) The eGFR has been calculated using the CKD EPI equation. This calculation has not been validated in all clinical situations. eGFR's persistently <60 mL/min signify possible Chronic Kidney Disease.   Georgiann Hahn gap 06/14/2018 11  5 - 15 Final   Performed at [facility], [place] [address], [place], [place] [address]  . WBC Count 06/14/2018 4.8  4.0 - 10.5 K/uL Final  . RBC 06/14/2018 2.93* 3.87 - 5.11 MIL/uL Final  . Hemoglobin 06/14/2018 8.7* 12.0 - 15.0 g/dL Final  . HCT 06/14/2018 27.6* 36.0 - 46.0 % Final  . MCV 06/14/2018 94.2  80.0 - 100.0 fL Final  . MCH 06/14/2018 29.7  26.0 - 34.0 pg Final  . MCHC 06/14/2018 31.5  30.0 - 36.0 g/dL Final  . RDW 06/14/2018 18.0* 11.5 - 15.5 % Final   . Platelet Count 06/14/2018 205  150 - 400 K/uL Final  . nRBC 06/14/2018 0.0  0.0 - 0.2 % Final   Performed at [facility], [place] [address], [place], [place] [address]  . Neutrophils Relative % 06/14/2018 PENDING  % Incomplete  . Neutro Abs 06/14/2018 PENDING  1.7 - 7.7 K/uL Incomplete  . Band Neutrophils 06/14/2018 PENDING  % Incomplete  . Lymphocytes Relative 06/14/2018 PENDING  % Incomplete  . Lymphs Abs 06/14/2018 PENDING  0.7 - 4.0 K/uL Incomplete  . Monocytes Relative 06/14/2018 PENDING  % Incomplete  . Monocytes Absolute 06/14/2018 PENDING  0.1 - 1.0 K/uL Incomplete  . Eosinophils Relative 06/14/2018 PENDING  % Incomplete  . Eosinophils Absolute 06/14/2018 PENDING  0.0 - 0.5 K/uL Incomplete  .  Basophils Relative 06/14/2018 PENDING  % Incomplete  . Basophils Absolute 06/14/2018 PENDING  0.0 - 0.1 K/uL Incomplete  . WBC Morphology 06/14/2018 PENDING   Incomplete  . RBC Morphology 06/14/2018 PENDING   Incomplete  . Smear Review 06/14/2018 PENDING   Incomplete  . Other 06/14/2018 PENDING  % Incomplete  . nRBC 06/14/2018 PENDING  0 /100 WBC Incomplete  . Metamyelocytes Relative 06/14/2018 PENDING  % Incomplete  . Myelocytes 06/14/2018 PENDING  % Incomplete  . Promyelocytes Relative 06/14/2018 PENDING  % Incomplete  . Blasts 06/14/2018 PENDING  % Incomplete    (this displays the last labs from the last 3 days)  No results found for: TOTALPROTELP, ALBUMINELP, A1GS, A2GS, BETS, BETA2SER, GAMS, MSPIKE, SPEI (this displays SPEP labs)  No results found for: KPAFRELGTCHN, LAMBDASER, KAPLAMBRATIO (kappa/lambda light chains)  No results found for: HGBA, HGBA2QUANT, HGBFQUANT, HGBSQUAN (Hemoglobinopathy evaluation)   No results found for: LDH  Lab Results  Component Value Date   IRON 35 (L) 01/30/2010   IRONPCTSAT 8.8 (L) 01/30/2010   (Iron and TIBC)  No results found for: FERRITIN  Urinalysis    Component Value Date/Time   COLORURINE RED (A)  04/01/2018 0202   APPEARANCEUR HAZY (A) 04/01/2018 0202   LABSPEC 1.021 04/01/2018 0202   PHURINE 6.0 04/01/2018 0202   GLUCOSEU >=500 (A) 04/01/2018 0202   GLUCOSEU NEGATIVE 09/27/2017 1002   HGBUR LARGE (A) 04/01/2018 0202   BILIRUBINUR NEGATIVE 04/01/2018 0202   KETONESUR 5 (A) 04/01/2018 0202   PROTEINUR 100 (A) 04/01/2018 0202   UROBILINOGEN 0.2 09/27/2017 1002   NITRITE NEGATIVE 04/01/2018 0202   LEUKOCYTESUR NEGATIVE 04/01/2018 0202     STUDIES: US Breast Ltd Uni Right Inc Axilla  Result Date: 05/19/2018 CLINICAL DATA:  Patient presents with right axillary redness, swelling and pain, deep to an axillary node dissection scar from her recent right lumpectomy for breast carcinoma. Has another scar below this in the 10 o'clock position of the right breast from the breast cancer resection. EXAM: ULTRASOUND OF THE RIGHT BREAST/AXILLA COMPARISON:  Previous exam(s). FINDINGS: On physical exam, there is erythema, warmth and tenderness surrounding the axillary scar. There is a keloid that has formed within the axillary scar. There is masslike fullness deep to the axillary scar. There is also prominence deep to the upper-outer quadrant lumpectomy scar. Targeted ultrasound is performed, showing a large collection at 10 o'clock, 20 cm the nipple, deep to the lumpectomy scar. The collection measures 6 x 2.5 x 5.4 cm. In the right axilla, there is a second collection, deep to the axillary node dissection scar, measuring 4.8 x 3.5 x 3.6 cm. IMPRESSION: 1. There are collections deep to both right breast incisions, the 10 o'clock position lumpectomy scar and the axillary node dissection scar. Inflammation versus cellulitis surrounds the axillary node dissection scar. RECOMMENDATION: Ultrasound-guided aspiration of both collections will be performed. If either appears infected, fluid will be sent for laboratory analysis. I have discussed the findings and recommendations with the patient. Results were also  provided in writing at the conclusion of the visit. If applicable, a reminder letter will be sent to the patient regarding the next appointment. BI-RADS CATEGORY  2: Benign. Electronically Signed   By: Lajean Manes M.D.   On: 05/19/2018 08:11   US Breast Aspiration Right  Addendum Date: 05/25/2018   ADDENDUM REPORT: 05/25/2018 14:46 ADDENDUM: Right breast aspiration yielded Gram Stain-RARE WBC PRESENT, PREDOMINANTLY PMN, NO ORGANISMS SEEN. Culture-RARE NORMAL SKIN FLORA, NO ANAEROBES ISOLATED. I notified the patient  of results by telephone and her questions were answered. The patient reported taking her antibiotics as prescribed and the affected area was smaller. She was instructed to call for any additional questions or concerns. Surgical consultation has been arranged with Dr. Stark Klein at [facility] on May 26, 2018. Pathology results reported by Terie Purser, RN on 05/25/2018. Electronically Signed   By: Lajean Manes M.D.   On: 05/25/2018 14:46   Result Date: 05/25/2018 CLINICAL DATA:  Aspiration of 2 adjacent right-sided postsurgical collections, 1 deep to the 10 o'clock position lumpectomy scar in the other deep to the axillary node dissection scar. EXAM: ULTRASOUND GUIDED RIGHT BREAST CYST ASPIRATION COMPARISON:  Previous exams. PROCEDURE: Using sterile technique, 1% lidocaine, under direct ultrasound visualization, needle aspiration of the axillary region flexion was performed. A total of 30 cc of reddish brown fluid was collected. Given the surrounding signs of inflammation/infection, this fluid was sent for laboratory analysis. Using sterile technique, 1% lidocaine, under direct ultrasound visualization, needle aspiration of collection deep to the lumpectomy scar at 10 o'clock was performed. 50 cc of yellowish fluid was aspirated. Both collections were evacuated. IMPRESSION: Ultrasound-guided aspiration of two postoperative collections on the right. Collection in the right  axilla deep to the axillary node dissection scar contained possibly infected fluid, which was sent for laboratory analysis. No apparent complications. RECOMMENDATIONS: 1. Patient was placed on doxycycline, 100 mg p.o. b.i.d. for 10 days. 2. Recommend clinical follow-up in 10-14 days for reassessment. If the symptoms of infection have not resolved, consider an additional antibiotic course as well as repeat sonographic evaluation to assess for a recurrent collection. Electronically Signed: By: Lajean Manes M.D. On: 05/19/2018 08:16    ELIGIBLE FOR AVAILABLE RESEARCH PROTOCOL: no  ASSESSMENT: 47 y.o. [place], [name] woman status post right breast upper outer quadrant biopsy 01/11/2018 for a clinical T1 N0, stage IA invasive ductal carcinoma, grade 3, estrogen and progesterone receptor positive, HER-2 amplified, with an MIB-1 of 30%.  (1) status post right lumpectomy and sentinel lymph node sampling 02/23/2018 for a pT2 pN1, stage IB invasive ductal carcinoma, grade 3, with close but negative margins  (2) adjuvant chemotherapy consisting of carboplatin, docetaxel, trastuzumab and Pertuzumab every 21 days x 6 started 03/08/2018  (a) pertuzumab stopped after cycle 1 due to diarrhea  (b) docetaxel changed to gemcitabine after cycle 2 due to persistent hyperglycemia  (c) Gemcitabine and Carboplatin dose reduced by approximately 10% due to delayed neutropenia and thrombocytopenia  (3) anti-HER-2 immunotherapy to start concurrently with chemotherapy  (a) baseline echocardiogram on 02/07/2018 showed an ejection fraction in the 55-60% range  (4) adjuvant radiation to follow  (5) antiestrogens to follow at the completion of local treatment  (6) genetics testing through [facility] Multi-cancer and Breast panel on 01/13/2018 showed: no deleterious mutations. The following genes were evaluated for sequence changes and exonic deletions/duplications: ALK, APC, ATM, AXIN2, BAP1, BARD1, BLM, BMPR1A, BRCA1, BRCA2,  BRIP1, CASR, CDC73, CDH1, CDK4, CDKN1B, CDKN1C,CDKN2A (p14ARF), CDKN2A (p16INK4a), CEBPA, CHEK2, CTNNA1, DICER1, DIS3L2, EPCAM*, FH, FLCN, GATA2, GPC3, GREM1*, HRAS, KIT, MAX, MEN1, MET, MLH1, MSH2, MSH3, MSH6, MUTYH, NBN, NF1, NF2, PALB2, PDGFRA, PHOX2B*, PMS2, POLD1,POLE, POT1, PRKAR1A, PTCH1, PTEN, RAD50, RAD51C, RAD51D, RB1, RECQL4, RET, RUNX1, SDHAF2, SDHB, SDHC, SDHD,SMAD4, SMARCA4, SMARCB1, SMARCE1, STK11, SUFU, TERC, TERT, TMEM127, TP53, TSC1, TSC2, VHL, WRN*, WT1.The following genes were evaluated for sequence changes only: EGFR*, HOXB13*, MITF*, NTHL1*, SDHA  PLAN: Keniah is doing well today.  Her labs are stable and I reviewed those with  her in detail.  She tolerated chemotherapy moderately well.  She has slight hypokalemia with a potassium of 3.2.  She has been eating increased potassium rich foods, and it has persisted.  I sent in a few days of kdur for her to take.    Lauralee is scheduled with echo and f/u in cardiology on 06/19/18.    Maiyah will return in 2 weeks for labs, f/u and her sixth cycle of treatment.  I reviewed her progress with our breast navigator who is reaching out to work on setting up radiation oncology f/u with Dr. Isidore Moos.    Estee knows to call for any questions or concerns prior to her next appointment.    A total of (20) minutes of face-to-face time was spent with this patient with greater than 50% of that time in counseling and care-coordination.   Wilber Bihari, NP 06/14/18 10:10 AM Medical Oncology and Hematology [facility] [address] [place], [place] [address] Tel. [phone]    Fax. [phone]

## 2018-06-14 NOTE — Telephone Encounter (Signed)
Gave patient avs and calendar.   °

## 2018-06-19 ENCOUNTER — Ambulatory Visit (HOSPITAL_BASED_OUTPATIENT_CLINIC_OR_DEPARTMENT_OTHER)
Admission: RE | Admit: 2018-06-19 | Discharge: 2018-06-19 | Disposition: A | Discharge status: Home / Self Care | Payer: BC Managed Care – PPO | Source: Ambulatory Visit | Attending: Cardiology | Admitting: Cardiology

## 2018-06-19 ENCOUNTER — Ambulatory Visit (HOSPITAL_COMMUNITY)
Admission: RE | Admit: 2018-06-19 | Discharge: 2018-06-19 | Disposition: A | Discharge status: Home / Self Care | Payer: BC Managed Care – PPO | Source: Ambulatory Visit | Attending: Cardiology | Admitting: Cardiology

## 2018-06-19 ENCOUNTER — Encounter (HOSPITAL_COMMUNITY): Payer: Self-pay | Admitting: Cardiology

## 2018-06-19 ENCOUNTER — Other Ambulatory Visit: Payer: Self-pay

## 2018-06-19 VITALS — BP 128/74 | HR 80 | Wt 248.5 lb

## 2018-06-19 DIAGNOSIS — C50411 Malignant neoplasm of upper-outer quadrant of right female breast: Secondary | ICD-10-CM

## 2018-06-19 DIAGNOSIS — E785 Hyperlipidemia, unspecified: Secondary | ICD-10-CM | POA: Insufficient documentation

## 2018-06-19 DIAGNOSIS — E119 Type 2 diabetes mellitus without complications: Secondary | ICD-10-CM | POA: Insufficient documentation

## 2018-06-19 DIAGNOSIS — Z170 Estrogen receptor positive status [ER+]: Secondary | ICD-10-CM

## 2018-06-19 DIAGNOSIS — I10 Essential (primary) hypertension: Secondary | ICD-10-CM | POA: Diagnosis not present

## 2018-06-19 NOTE — Progress Notes (Signed)
  Echocardiogram 2D Echocardiogram has been performed.  Jannett Celestine 06/19/2018, 2:37 PM

## 2018-06-19 NOTE — Patient Instructions (Signed)
Your physician has requested that you have an echocardiogram. Echocardiography is a painless test that uses sound waves to create images of your heart. It provides your doctor with information about the size and shape of your heart and how well your heart's chambers and valves are working. This procedure takes approximately one hour. There are no restrictions for this procedure.  Your physician recommends that you schedule a follow-up appointment in: 3 months with an Echo

## 2018-06-20 NOTE — Progress Notes (Signed)
Oncologist: Kelly Rowe  47 yo with history of type II diabetes, HIV, and breast cancer was referred by Kelly Rowe for cardio-oncology evaluation.  Breast cancer on right was diagnosed by biopsy in 6/19.  ER+/PR+/HER2+.  She has had chemotherapy then right lumpectomy in 8/19.  She will continue Herceptin for a year.   She has no prior cardiac history.  No exertional chest pain or dyspnea. No palpitations. No orthopnea/PND.  She has generalized fatigue.   PMH: 1. Type 2 diabetes 2. Asthma 3. HIV 4. Breast cancer: On right, diagnosed by biopsy in 6/19.  ER+/PR+/HER2+.  She had chemotherapy then lumpectomy in 8/19.  She will be on Herceptin for a year.   - Echo (7/19): EF 55-60%, mild LVH, GLS -17.9%, normal RV size and systolic function.  - Echo (11/19): EF 55-60%, GLS -52.8%, normal diastolic function, normal RV.   Social History   Socioeconomic History  . Marital status: Divorced    Spouse name: Not on file  . Number of children: Not on file  . Years of education: Not on file  . Highest education level: Not on file  Occupational History  . Not on file  Social Needs  . Financial resource strain: Not on file  . Food insecurity:    Worry: Not on file    Inability: Not on file  . Transportation needs:    Medical: Not on file    Non-medical: Not on file  Tobacco Use  . Smoking status: Never Smoker  . Smokeless tobacco: Never Used  Substance and Sexual Activity  . Alcohol use: Never    Alcohol/week: 0.0 standard drinks    Frequency: Never  . Drug use: No  . Sexual activity: Not Currently    Birth control/protection: Surgical    Comment: HAS HAD A HYSTERECTOMY  Lifestyle  . Physical activity:    Days per week: Not on file    Minutes per session: Not on file  . Stress: Not on file  Relationships  . Social connections:    Talks on phone: Not on file    Gets together: Not on file    Attends religious service: Not on file    Active member of club or organization: Not on  file    Attends meetings of clubs or organizations: Not on file    Relationship status: Not on file  Other Topics Concern  . Not on file  Social History Narrative  . Not on file   Family History  Problem Relation Age of Onset  . Prostate cancer Other   . Heart disease Other   . Stroke Other   . Diabetes Mother   . Hypertension Mother   . Diabetes Father   . Cancer Father        COLON and LU NG  . Stroke Paternal Uncle   . Breast cancer Paternal Aunt   . Lung cancer Maternal Grandmother 55       Mesothelioma  . Colon cancer Paternal Grandfather        dx over 57s  . Lung cancer Paternal Aunt   . Breast cancer Paternal Aunt   . Breast cancer Paternal Aunt   . Heart attack Maternal Grandfather 71  . Colon cancer Other        MGM's 5 brothers   ROS: All systems reviewed and negative except as per HPI.   BP 128/74   Pulse 80   Wt 112.7 kg (248 lb 8 oz)   LMP 06/21/2010  SpO2 100%   BMI 45.45 kg/m  General: NAD Neck: No JVD, no thyromegaly or thyroid nodule.  Lungs: Clear to auscultation bilaterally with normal respiratory effort. CV: Nondisplaced PMI.  Heart regular S1/S2, no S3/S4, no murmur.  No peripheral edema.  No carotid bruit.  Normal pedal pulses.  Abdomen: Soft, nontender, no hepatosplenomegaly, no distention.  Skin: Intact without lesions or rashes.  Neurologic: Alert and oriented x 3.  Psych: Normal affect. Extremities: No clubbing or cyanosis.  HEENT: Normal.   Assessment/Plan: Patient has breast cancer and will be getting therapy including Herceptin x 1 year.  I reviewed today's echo: EF and strain parameters were normal.  - Repeat echo + followup in 3 months.

## 2018-06-27 ENCOUNTER — Inpatient Hospital Stay (HOSPITAL_BASED_OUTPATIENT_CLINIC_OR_DEPARTMENT_OTHER): Payer: BC Managed Care – PPO | Admitting: Adult Health

## 2018-06-27 ENCOUNTER — Encounter: Payer: Self-pay | Admitting: Adult Health

## 2018-06-27 ENCOUNTER — Telehealth: Payer: Self-pay | Admitting: Adult Health

## 2018-06-27 ENCOUNTER — Ambulatory Visit (HOSPITAL_COMMUNITY)
Admission: RE | Admit: 2018-06-27 | Discharge: 2018-06-27 | Disposition: A | Discharge status: Home / Self Care | Payer: BC Managed Care – PPO | Source: Ambulatory Visit | Attending: Adult Health | Admitting: Adult Health

## 2018-06-27 ENCOUNTER — Inpatient Hospital Stay: Payer: BC Managed Care – PPO

## 2018-06-27 ENCOUNTER — Telehealth: Payer: Self-pay | Admitting: Oncology

## 2018-06-27 ENCOUNTER — Inpatient Hospital Stay: Payer: BC Managed Care – PPO | Attending: Oncology

## 2018-06-27 VITALS — BP 131/70 | HR 94 | Temp 99.5°F | Resp 18 | Wt 246.3 lb

## 2018-06-27 DIAGNOSIS — J069 Acute upper respiratory infection, unspecified: Secondary | ICD-10-CM

## 2018-06-27 DIAGNOSIS — R509 Fever, unspecified: Secondary | ICD-10-CM

## 2018-06-27 DIAGNOSIS — B20 Human immunodeficiency virus [HIV] disease: Secondary | ICD-10-CM

## 2018-06-27 DIAGNOSIS — C773 Secondary and unspecified malignant neoplasm of axilla and upper limb lymph nodes: Secondary | ICD-10-CM | POA: Diagnosis not present

## 2018-06-27 DIAGNOSIS — C50411 Malignant neoplasm of upper-outer quadrant of right female breast: Secondary | ICD-10-CM | POA: Insufficient documentation

## 2018-06-27 DIAGNOSIS — R05 Cough: Secondary | ICD-10-CM | POA: Diagnosis present

## 2018-06-27 DIAGNOSIS — Z79899 Other long term (current) drug therapy: Secondary | ICD-10-CM | POA: Diagnosis not present

## 2018-06-27 DIAGNOSIS — Z170 Estrogen receptor positive status [ER+]: Secondary | ICD-10-CM | POA: Insufficient documentation

## 2018-06-27 DIAGNOSIS — R059 Cough, unspecified: Secondary | ICD-10-CM

## 2018-06-27 DIAGNOSIS — Z5189 Encounter for other specified aftercare: Secondary | ICD-10-CM | POA: Insufficient documentation

## 2018-06-27 DIAGNOSIS — Z7982 Long term (current) use of aspirin: Secondary | ICD-10-CM | POA: Insufficient documentation

## 2018-06-27 DIAGNOSIS — Z5111 Encounter for antineoplastic chemotherapy: Secondary | ICD-10-CM | POA: Insufficient documentation

## 2018-06-27 DIAGNOSIS — E1165 Type 2 diabetes mellitus with hyperglycemia: Secondary | ICD-10-CM | POA: Insufficient documentation

## 2018-06-27 DIAGNOSIS — I10 Essential (primary) hypertension: Secondary | ICD-10-CM | POA: Insufficient documentation

## 2018-06-27 DIAGNOSIS — E119 Type 2 diabetes mellitus without complications: Secondary | ICD-10-CM

## 2018-06-27 DIAGNOSIS — Z5112 Encounter for antineoplastic immunotherapy: Secondary | ICD-10-CM | POA: Insufficient documentation

## 2018-06-27 DIAGNOSIS — C50911 Malignant neoplasm of unspecified site of right female breast: Secondary | ICD-10-CM

## 2018-06-27 DIAGNOSIS — Z95828 Presence of other vascular implants and grafts: Secondary | ICD-10-CM

## 2018-06-27 LAB — CMP (CANCER CENTER ONLY)
ALT: 30 U/L (ref 0–44)
AST: 32 U/L (ref 15–41)
Albumin: 3.4 g/dL — ABNORMAL LOW (ref 3.5–5.0)
Alkaline Phosphatase: 201 U/L — ABNORMAL HIGH (ref 38–126)
Anion gap: 10 (ref 5–15)
BUN: 7 mg/dL (ref 6–20)
CO2: 27 mmol/L (ref 22–32)
Calcium: 9.1 mg/dL (ref 8.9–10.3)
Chloride: 100 mmol/L (ref 98–111)
Creatinine: 0.82 mg/dL (ref 0.44–1.00)
GFR, Est AFR Am: >60 mL/min (ref >60)
GFR, Estimated: >60 mL/min (ref >60)
Glucose, Bld: 163 mg/dL — ABNORMAL HIGH (ref 70–99)
Potassium: 3.7 mmol/L (ref 3.5–5.1)
Sodium: 137 mmol/L (ref 135–145)
Total Bilirubin: 0.3 mg/dL (ref 0.3–1.2)
Total Protein: 7.9 g/dL (ref 6.5–8.1)

## 2018-06-27 LAB — CBC WITH DIFFERENTIAL (CANCER CENTER ONLY)
Abs Immature Granulocytes: 0.27 10*3/uL — ABNORMAL HIGH (ref 0.00–0.07)
Basophils Absolute: 0 10*3/uL (ref 0.0–0.1)
Basophils Relative: 0 %
Eosinophils Absolute: 0 10*3/uL (ref 0.0–0.5)
Eosinophils Relative: 0 %
HCT: 28.8 % — ABNORMAL LOW (ref 36.0–46.0)
Hemoglobin: 9.1 g/dL — ABNORMAL LOW (ref 12.0–15.0)
Immature Granulocytes: 3 %
Lymphocytes Relative: 20 %
Lymphs Abs: 1.6 10*3/uL (ref 0.7–4.0)
MCH: 30.1 pg (ref 26.0–34.0)
MCHC: 31.6 g/dL (ref 30.0–36.0)
MCV: 95.4 fL (ref 80.0–100.0)
Monocytes Absolute: 1 10*3/uL (ref 0.1–1.0)
Monocytes Relative: 13 %
Neutro Abs: 5.1 10*3/uL (ref 1.7–7.7)
Neutrophils Relative %: 64 %
Platelet Count: 186 10*3/uL (ref 150–400)
RBC: 3.02 MIL/uL — ABNORMAL LOW (ref 3.87–5.11)
RDW: 19 % — ABNORMAL HIGH (ref 11.5–15.5)
WBC Count: 8 10*3/uL (ref 4.0–10.5)
nRBC: 0.4 % — ABNORMAL HIGH (ref 0.0–0.2)

## 2018-06-27 MED ORDER — AZITHROMYCIN 250 MG PO TABS: ORAL_TABLET | ORAL | 0 refills | Status: DC

## 2018-06-27 MED ORDER — HEPARIN SOD (PORK) LOCK FLUSH 100 UNIT/ML IV SOLN
500.0000 [IU] | Freq: Once | INTRAVENOUS | Status: AC
  Administered 2018-06-27: 500 [IU]
  Filled 2018-06-27: qty 5

## 2018-06-27 MED ORDER — SODIUM CHLORIDE 0.9% FLUSH
10.0000 mL | Freq: Once | INTRAVENOUS | Status: AC
  Administered 2018-06-27: 10 mL
  Filled 2018-06-27: qty 10

## 2018-06-27 MED FILL — AZITHROMYCIN 250 MG TABLET: 250 | 5 days supply | Qty: 6 | Fill #0

## 2018-06-27 NOTE — Telephone Encounter (Signed)
Appointments scheduled/cancelled per 12/3 los (dates/tx). Also 12/5 injection cxd per LC. Spoke with patient re next appointment for 12/11.

## 2018-06-27 NOTE — Telephone Encounter (Signed)
Called patient infectious disease team at [facility] and let them know about patient's URI, and chest xray concerning for right upper lobe patchy opacity.  I informed them that I had already sent in [place] and noted her allergies to PCN and Levaquin.  My message was sent to ID care team.  Wilber Bihari, NP

## 2018-06-27 NOTE — Progress Notes (Signed)
[name]  [phone] [phone]     ID: MARIELLA Rowe DOB: September 26, 1970  MR#: [id number]  FXT#:[id number]  Patient Care Team: Biagio Borg, MD as PCP - [name], MD as Consulting Physician (General Surgery) [name], [name], MD as Consulting Physician (Oncology) [name], MD as Attending Physician (Radiation Oncology) [name], NP as Nurse Practitioner (Obstetrics and Gynecology) Marlinda Mike, PA-C as Referring Physician (Physician Assistant) OTHER MD:   CHIEF COMPLAINT: Triple positive breast cancer  CURRENT TREATMENT: adjuvant chemotherapy   HISTORY OF CURRENT ILLNESS: From the original intake note:  "Kelly Rowe" noticed bruising in her lateral right breast and palpated a mass  on 12/23/2017. She followed up with her gynecologist. She underwent unilateral right diagnostic mammography with tomography and right breast ultrasonography at [facility] on 01/05/2018 showing: breast density category B. There is an irregular highly suspicious mass within the right breast at the 9:30 o'clock upper outer quadrant, measuring 1.8 x 1.1 x 1.5 cm, and located 18 cm from the nipple,  Ultrasonography revealed a single morphologically abnormal lymph node in the RIGHT axilla, with cortical thickness of 6 mm.   Accordingly on 01/11/2018 she proceeded to biopsy of the right breast area in question as well as a suspicious lymph node. The pathology from this procedure showed ([id number]): Invasive ductal carcinoma grade III.  The lymph node biopsied was negative for carcinoma (concordant).. Prognostic indicators significant for: estrogen receptor, 60% positive with weak staining intensity and progesterone receptor, 10% positive with strong staining intensity. Proliferation marker Ki67 at 30%. HER2 amplified with ratios HER2/CEP17 signals 2.26 and average HER2 copies per cell 3.50  The patient's subsequent history is as detailed  below.  INTERVAL HISTORY: [name] returns today for follow up and treatment of her triple positive breast cancer. She is receiving adjuvant chemotherapy with Gemcitabine (previously Docetaxel), Carboplatin, and Trastuzumab,  given on day 1 of every 21 day cycle. She receives [place] support on day 3. Tomorrow is day 1 cycle 6.     REVIEW OF SYSTEMS: Kelly Rowe is not feeling well today.  She has a low grade fever.   She developed nasal drainage, productive cough of green and white sputum on Saturday.  She says that it was made worse by second hand smoke.  This is getting better, because her throat is less sore and she has a voice today.  She has a low grade fever today, but cannot tell if she has any other fevers because she has hot flashes.  She does not have sinus pain or pressure.  She denies pleuritic chest pain.  She does have abdominal pain from coughing.  She denies shortness of breath.  She is without dizziness, her appetite is stable, and a detailed ROS is otherwise non contributory.  PAST MEDICAL HISTORY: Past Medical History:  Diagnosis Date  . ANEMIA-IRON DEFICIENCY 01/30/2010  . ANXIETY 11/27/2007  . Arthritis   . Asthma 02/25/2011  . Cancer ([place])    breast  . Carbuncle and furuncle of trunk 04/16/2010  . Chlamydia infection 03/21/2008  . DIABETES MELLITUS, TYPE II 08/02/2007  . Edema 01/30/2010  . ELEVATED BLOOD PRESSURE WITHOUT DIAGNOSIS OF HYPERTENSION 08/02/2007  . Family history of breast cancer   . Family history of colon cancer   . Family history of lung cancer   . FREQUENCY, URINARY 05/19/2009  . GENITAL HERPES 03/12/2009  . GERD (gastroesophageal reflux disease)   . History of kidney stones   . HIV (human  immunodeficiency virus infection) ([place]) 2009  . HIV INFECTION 03/12/2009  . HSV (herpes simplex virus) infection   . HYPERLIPIDEMIA 08/02/2007  . Hypertension   . Metrorrhagia 03/21/2008  . PONV (postoperative nausea and vomiting)    woke up crying per patient   . RETENTION,  URINE 05/19/2009  . Trichomonas infection 01/19/2010  . VITAMIN D DEFICIENCY 01/30/2010    PAST SURGICAL HISTORY: Past Surgical History:  Procedure Laterality Date  . ABDOMINAL HYSTERECTOMY  07/22/2010   TAH WITH PRESERVATION OF BOTH TUBES AND OVARIES  . BREAST LUMPECTOMY WITH RADIOACTIVE SEED AND SENTINEL LYMPH NODE BIOPSY Right 02/23/2018   Procedure: BREAST LUMPECTOMY WITH RADIOACTIVE SEED AND SENTINEL LYMPH NODE BIOPSY;  Surgeon: Stark Klein, MD;  Location: [facility];  Service: General;  Laterality: Right;  . CHOLECYSTECTOMY    . ENDOMETRIAL ABLATION  01/11/2008   HER OPTION  . ESOPHAGOGASTRODUODENOSCOPY    . MULTIPLE TOOTH EXTRACTIONS    . PORTACATH PLACEMENT Left 02/23/2018   Procedure: INSERTION PORT-A-CATH;  Surgeon: Stark Klein, MD;  Location: [facility];  Service: General;  Laterality: Left;  . PORTACATH PLACEMENT N/A 04/21/2018   Procedure: PORT-A-CATH REVISION;  Surgeon: Stark Klein, MD;  Location: [facility];  Service: General;  Laterality: N/A;  . TUBAL LIGATION    . WISDOM TOOTH EXTRACTION      FAMILY HISTORY Family History  Problem Relation Age of Onset  . Prostate cancer Other   . Heart disease Other   . Stroke Other   . Diabetes Mother   . Hypertension Mother   . Diabetes Father   . Cancer Father        COLON and LU NG  . Stroke Paternal Uncle   . Breast cancer Paternal Aunt   . Lung cancer Maternal Grandmother 21       Mesothelioma  . Colon cancer Paternal Grandfather        dx over 54s  . Lung cancer Paternal Aunt   . Breast cancer Paternal Aunt   . Breast cancer Paternal Aunt   . Heart attack Maternal Grandfather 71  . Colon cancer Other        MGM's 5 brothers  The patient's father died at age 36 due to metastatic liver cancer. The patient's mother is alive at 18. The patient's has 1 brother and 3 sisters. There was a paternal grandfather with colon cancer. There were 3 paternal aunts with breast cancer, 2 diagnosed in the 60's and 1 at age 70. There was a  maternal grandmother with mesothelioma. The patient otherwise denies a history of ovarian cancer in the family.    GYNECOLOGIC HISTORY:  Patient's last menstrual period was 06/21/2010. Menarche: 47 years old Age at first live birth: 47 years old She is GXP5. Her LMP was December 2011. She is status post partial hysterectomy without oophorectomy. She took oral contraception for 3 years with no complications. She never took HRT.    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  At home are 2 of her sons, Glendell Docker and [name], and the patient's grandson, Amador Cunas 50 (who is Willie's son). The patient's oldest is [place] age 74 who lives in [place] as a cook. Daughter, [place] age 15 works as a Scientist, water quality. Son, Glendell Docker age 26 is disabled. Son, [name] age 57 lives in [place] as a Microbiologist. Daughter, Benard Rink age 22 also is a Microbiologist. The patient has 5 grandchildren and no great grandchildren. The patient does not belong to a church.  ADVANCED DIRECTIVES: Not in place; at the 01/18/2018 visit the patient was given the appropriate documents to complete on notarized at her discretion   HEALTH MAINTENANCE: Social History   Tobacco Use  . Smoking status: Never Smoker  . Smokeless tobacco: Never Used  Substance Use Topics  . Alcohol use: Never    Alcohol/week: 0.0 standard drinks    Frequency: Never  . Drug use: No     Colonoscopy: Not yet  PAP: November 2018  Bone density: Never   Allergies  Allergen Reactions  . Levaquin [Levofloxacin In D5w] Nausea And Vomiting and Rash  . Penicillins Swelling and Rash    Has patient had a PCN reaction causing immediate rash, facial/tongue/throat swelling, SOB or lightheadedness with hypotension: Yes Has patient had a PCN reaction causing severe rash involving mucus membranes or skin necrosis: No Has patient had a PCN reaction that required hospitalization: Yes Has patient had a PCN reaction occurring within the last 10 years: No If all of the  above answers are "NO", then may proceed with Cephalosporin use.    Current Outpatient Medications  Medication Sig Dispense Refill  . albuterol (PROVENTIL HFA;VENTOLIN HFA) 108 (90 Base) MCG/ACT inhaler Inhale 2 puffs into the lungs every 6 (six) hours as needed for wheezing or shortness of breath.    Marland Kitchen aspirin 81 MG tablet Take 81 mg by mouth daily as needed (chest pain).     . Azelastine-Fluticasone (DYMISTA) 137-50 MCG/ACT SUSP Use as directed 1 spray each side twice per day as needed 23 g 5  . Beclomethasone Dipropionate (QNASL) 80 MCG/ACT AERS Place 2 sprays into the nose daily. 1 Inhaler 1  . bictegravir-emtricitabine-tenofovir AF (BIKTARVY) 50-200-25 MG TABS tablet Take 1 tablet by mouth daily.    . blood glucose meter kit and supplies Dispense based on patient and insurance preference. Use up to four times daily as directed. (FOR ICD-10 E10.9, E11.9). 1 each 0  . Diclofenac Sodium (PENNSAID) 2 % SOLN Place 1 application onto the skin 2 (two) times daily. (Patient taking differently: Place 1 application onto the skin 2 (two) times daily as needed. ) 1 Bottle 3  . Dulaglutide (TRULICITY) 1.5 ZJ/6.7HA SOPN 0.5 ml weekly SQ 6 mL 3  . famotidine (PEPCID) 20 MG tablet Take 1 tablet (20 mg total) by mouth 2 (two) times daily. (Patient taking differently: Take 20 mg by mouth daily as needed for heartburn. ) 60 tablet 3  . ferrous gluconate (IRON 27) 240 (27 FE) MG tablet Take 240 mg by mouth daily as needed (iron).     Marland Kitchen glipiZIDE (GLIPIZIDE XL) 5 MG 24 hr tablet Take 1 tablet (5 mg total) by mouth daily with breakfast. 90 tablet 3  . ibuprofen (ADVIL,MOTRIN) 800 MG tablet Take 1 tablet (800 mg total) by mouth every 8 (eight) hours as needed. (Patient taking differently: Take 800 mg by mouth daily as needed for moderate pain. ) 60 tablet 0  . lidocaine-prilocaine (EMLA) cream Apply to affected area once (Patient taking differently: Apply 1 application topically once. Apply to affected area once) 30  g 3  . loratadine (CLARITIN) 10 MG tablet Take 10 mg by mouth daily.    Marland Kitchen LORazepam (ATIVAN) 0.5 MG tablet Take 1 tablet (0.5 mg total) by mouth at bedtime as needed (Nausea or vomiting). 30 tablet 0  . metFORMIN (GLUCOPHAGE-XR) 500 MG 24 hr tablet Take 2 tablets (1,000 mg total) by mouth daily with breakfast. Annual appt is due must see provider for future  refills 180 tablet 3  . Multiple Vitamin (MULTIVITAMIN WITH MINERALS) TABS tablet Take 1 tablet by mouth daily.    Marland Kitchen omeprazole (PRILOSEC) 40 MG capsule Take 1 capsule (40 mg total) by mouth daily. 30 capsule 0  . ondansetron (ZOFRAN) 8 MG tablet Take 1 tablet (8 mg total) by mouth every 8 (eight) hours as needed for nausea or vomiting. 20 tablet 0  . oxyCODONE (OXY IR/ROXICODONE) 5 MG immediate release tablet Take 1-2 tablets (5-10 mg total) by mouth every 6 (six) hours as needed for moderate pain, severe pain or breakthrough pain. 30 tablet 0  . prochlorperazine (COMPAZINE) 10 MG tablet Take 1 tablet (10 mg total) by mouth every 6 (six) hours as needed (Nausea or vomiting). 30 tablet 1  . valACYclovir (VALTREX) 500 MG tablet Take twice daily for 3-5 days 30 tablet 12  . zolpidem (AMBIEN) 10 MG tablet Take 1 tablet (10 mg total) by mouth at bedtime as needed for sleep. 90 tablet 1   No current facility-administered medications for this visit.    Facility-Administered Medications Ordered in Other Visits  Medication Dose Route Frequency Provider Last Rate Last Dose  . 0.9 %  sodium chloride infusion   Intravenous Once [name], [name], MD      . sodium chloride 0.9 % 1,000 mL with potassium chloride 10 mEq infusion   Intravenous Once [name], [name], MD        OBJECTIVE:   Vitals:   06/27/18 1238  BP: 131/70  Pulse: 94  Resp: 18  Temp: 99.5 F (37.5 C)  SpO2: 100%     Body mass index is 45.05 kg/m.   Wt Readings from Last 3 Encounters:  06/27/18 246 lb 5 oz (111.7 kg)  06/19/18 248 lb 8 oz (112.7 kg)  06/14/18 248 lb 4.8 oz  (112.6 kg)  ECOG FS: 1 - Symptomatic but completely ambulatory GENERAL: Patient is a tired appearing female in no acute distress HEENT:  Sclerae anicteric.  Oropharynx clear and moist. No ulcerations or evidence of oropharyngeal candidiasis. Neck is supple.  NODES:  No cervical, supraclavicular, or axillary lymphadenopathy palpated.  BREAST EXAM:   right axilla has healed, looks much improved. Right lumpectomy site healed, no sign of local recurrence LUNGS:  Clear to auscultation bilaterally.  No wheezes or rhonchi. HEART:  Regular rate and rhythm. No murmur appreciated. ABDOMEN:  Soft, nontender.  Positive, normoactive bowel sounds. No organomegaly palpated. MSK:  No focal spinal tenderness to palpation. EXTREMITIES:  No peripheral edema.   SKIN:  Clear with no obvious rashes or skin changes. No nail dyscrasia. NEURO:  Nonfocal. Well oriented.  Appropriate affect.     LAB RESULTS:  CMP     Component Value Date/Time   NA 139 06/14/2018 0900   K 3.2 (L) 06/14/2018 0900   CL 99 06/14/2018 0900   CO2 29 06/14/2018 0900   GLUCOSE 155 (H) 06/14/2018 0900   BUN 7 06/14/2018 0900   CREATININE 0.78 06/14/2018 0900   CALCIUM 8.6 (L) 06/14/2018 0900   PROT 7.2 06/14/2018 0900   ALBUMIN 3.5 06/14/2018 0900   AST 24 06/14/2018 0900   ALT 28 06/14/2018 0900   ALKPHOS 173 (H) 06/14/2018 0900   BILITOT 0.2 (L) 06/14/2018 0900   GFRNONAA >60 06/14/2018 0900   GFRAA >60 06/14/2018 0900    No results found for: TOTALPROTELP, ALBUMINELP, A1GS, A2GS, BETS, BETA2SER, GAMS, MSPIKE, SPEI  No results found for: KPAFRELGTCHN, LAMBDASER, KAPLAMBRATIO  Lab Results  Component Value  Date   WBC 8.0 06/27/2018   NEUTROABS 5.1 06/27/2018   HGB 9.1 (L) 06/27/2018   HCT 28.8 (L) 06/27/2018   MCV 95.4 06/27/2018   PLT 186 06/27/2018    _0 @  No results found for: LABCA2  No components found for: [id number]  No results for input(s): INR in the last 168 hours.  No results found  for: LABCA2  No results found for: HRC163  No results found for: AGT364  No results found for: WOE321  No results found for: CA2729  No components found for: HGQUANT  No results found for: CEA1 / No results found for: CEA1   No results found for: AFPTUMOR  No results found for: CHROMOGRNA  No results found for: PSA1  Appointment on 06/27/2018  Component Date Value Ref Range Status  . WBC Count 06/27/2018 8.0  4.0 - 10.5 K/uL Final  . RBC 06/27/2018 3.02* 3.87 - 5.11 MIL/uL Final  . Hemoglobin 06/27/2018 9.1* 12.0 - 15.0 g/dL Final  . HCT 06/27/2018 28.8* 36.0 - 46.0 % Final  . MCV 06/27/2018 95.4  80.0 - 100.0 fL Final  . MCH 06/27/2018 30.1  26.0 - 34.0 pg Final  . MCHC 06/27/2018 31.6  30.0 - 36.0 g/dL Final  . RDW 06/27/2018 19.0* 11.5 - 15.5 % Final  . Platelet Count 06/27/2018 186  150 - 400 K/uL Final  . nRBC 06/27/2018 0.4* 0.0 - 0.2 % Final  . Neutrophils Relative % 06/27/2018 64  % Final  . Neutro Abs 06/27/2018 5.1  1.7 - 7.7 K/uL Final  . Lymphocytes Relative 06/27/2018 20  % Final  . Lymphs Abs 06/27/2018 1.6  0.7 - 4.0 K/uL Final  . Monocytes Relative 06/27/2018 13  % Final  . Monocytes Absolute 06/27/2018 1.0  0.1 - 1.0 K/uL Final  . Eosinophils Relative 06/27/2018 0  % Final  . Eosinophils Absolute 06/27/2018 0.0  0.0 - 0.5 K/uL Final  . Basophils Relative 06/27/2018 0  % Final  . Basophils Absolute 06/27/2018 0.0  0.0 - 0.1 K/uL Final  . Immature Granulocytes 06/27/2018 3  % Final  . Abs Immature Granulocytes 06/27/2018 0.27* 0.00 - 0.07 K/uL Final   Performed at [facility], [address] [place],  [address]    (this displays the last labs from the last 3 days)  No results found for: TOTALPROTELP, ALBUMINELP, A1GS, A2GS, BETS, BETA2SER, GAMS, MSPIKE, SPEI (this displays SPEP labs)  No results found for: KPAFRELGTCHN, LAMBDASER, KAPLAMBRATIO (kappa/lambda light chains)  No results found for: HGBA, HGBA2QUANT,  HGBFQUANT, HGBSQUAN (Hemoglobinopathy evaluation)   No results found for: LDH  Lab Results  Component Value Date   IRON 35 (L) 01/30/2010   IRONPCTSAT 8.8 (L) 01/30/2010   (Iron and TIBC)  No results found for: FERRITIN  Urinalysis    Component Value Date/Time   COLORURINE RED (A) 04/01/2018 0202   APPEARANCEUR HAZY (A) 04/01/2018 0202   LABSPEC 1.021 04/01/2018 0202   PHURINE 6.0 04/01/2018 0202   GLUCOSEU >=500 (A) 04/01/2018 0202   GLUCOSEU NEGATIVE 09/27/2017 1002   HGBUR LARGE (A) 04/01/2018 0202   BILIRUBINUR NEGATIVE 04/01/2018 0202   KETONESUR 5 (A) 04/01/2018 0202   PROTEINUR 100 (A) 04/01/2018 0202   UROBILINOGEN 0.2 09/27/2017 1002   NITRITE NEGATIVE 04/01/2018 0202   LEUKOCYTESUR NEGATIVE 04/01/2018 0202     STUDIES: No results found.  ELIGIBLE FOR AVAILABLE RESEARCH PROTOCOL: no  ASSESSMENT: 47 y.o. [place], [name] woman status post right breast upper outer  quadrant biopsy 01/11/2018 for a clinical T1 N0, stage IA invasive ductal carcinoma, grade 3, estrogen and progesterone receptor positive, HER-2 amplified, with an MIB-1 of 30%.  (1) status post right lumpectomy and sentinel lymph node sampling 02/23/2018 for a pT2 pN1, stage IB invasive ductal carcinoma, grade 3, with close but negative margins  (2) adjuvant chemotherapy consisting of carboplatin, docetaxel, trastuzumab and Pertuzumab every 21 days x 6 started 03/08/2018  (a) pertuzumab stopped after cycle 1 due to diarrhea  (b) docetaxel changed to gemcitabine after cycle 2 due to persistent hyperglycemia  (c) Gemcitabine and Carboplatin dose reduced by approximately 10% due to delayed neutropenia and thrombocytopenia  (3) anti-HER-2 immunotherapy to start concurrently with chemotherapy  (a) baseline echocardiogram on 02/07/2018 showed an ejection fraction in the 55-60% range  (b) repeat echo 06/19/2018 showed an ejection fraction of 55-60%  (4) adjuvant radiation to follow  (5) antiestrogens to  follow at the completion of local treatment  (6) genetics testing through [facility] Multi-cancer and Breast panel on 01/13/2018 showed: no deleterious mutations. The following genes were evaluated for sequence changes and exonic deletions/duplications: ALK, APC, ATM, AXIN2, BAP1, BARD1, BLM, BMPR1A, BRCA1, BRCA2, BRIP1, CASR, CDC73, CDH1, CDK4, CDKN1B, CDKN1C,CDKN2A (p14ARF), CDKN2A (p16INK4a), CEBPA, CHEK2, CTNNA1, DICER1, DIS3L2, EPCAM*, FH, FLCN, GATA2, GPC3, GREM1*, HRAS, KIT, MAX, MEN1, MET, MLH1, MSH2, MSH3, MSH6, MUTYH, NBN, NF1, NF2, PALB2, PDGFRA, PHOX2B*, PMS2, POLD1,POLE, POT1, PRKAR1A, PTCH1, PTEN, RAD50, RAD51C, RAD51D, RB1, RECQL4, RET, RUNX1, SDHAF2, SDHB, SDHC, SDHD,SMAD4, SMARCA4, SMARCB1, SMARCE1, STK11, SUFU, TERC, TERT, TMEM127, TP53, TSC1, TSC2, VHL, WRN*, WT1.The following genes were evaluated for sequence changes only: EGFR*, HOXB13*, MITF*, NTHL1*, SDHA  PLAN: [place] is unfortunately not feeling well today.  Her labs are stable, however she does have a URI.  Given her h/o HIV and receiving chemotherapy, we will hold treatment one week, get a chest xray and I sent in a Zpak for her to take.  I checked for interactions between the zpak and her other meds on up to date and she is cleared to receive them.    We reviewed that if she worsens in any way to please call us, so we can see her back and do more testing if indicated.    I requested that her appointments be adjusted accordingly.  Lakasha knows to call for any questions or concerns prior to her next appointment.    A total of (30) minutes of face-to-face time was spent with this patient with greater than 50% of that time in counseling and care-coordination.  The above was reviewed with Dr. Jana Hakim in detail who is in agreement with the plan.    Wilber Bihari, NP 06/27/18 12:41 PM Medical Oncology and Hematology [facility] [address] [place], [place] [address] Tel. [phone]    Fax.  [phone]

## 2018-06-28 ENCOUNTER — Ambulatory Visit: Payer: BC Managed Care – PPO

## 2018-06-28 ENCOUNTER — Other Ambulatory Visit: Payer: BC Managed Care – PPO

## 2018-06-28 ENCOUNTER — Telehealth: Payer: Self-pay | Admitting: Adult Health

## 2018-06-28 NOTE — Telephone Encounter (Signed)
Called patient back to review chest xray results.  Left message on machine to return call.    Wilber Bihari, NP

## 2018-06-28 NOTE — Telephone Encounter (Signed)
Received call back from Marlinda Mike PA-C from [facility].  Message on my voice mail at 12 26 pm stated to consider adding Cefpodoxime 200mg  PO BID, noted challenge in accessing chest xray results in care everywhere.  I will fax results to Kathlee Nations [phone].  I asked Kathlee Nations that if Apolonio Schneiders or anyone else thought that the patient would benefit from additional treatment, to please send that in and/or follow up with patient.   Wilber Bihari, NP

## 2018-06-29 ENCOUNTER — Telehealth: Payer: Self-pay

## 2018-06-29 ENCOUNTER — Ambulatory Visit: Payer: BC Managed Care – PPO | Admitting: Internal Medicine

## 2018-06-29 ENCOUNTER — Ambulatory Visit: Payer: BC Managed Care – PPO

## 2018-06-29 DIAGNOSIS — Z0289 Encounter for other administrative examinations: Secondary | ICD-10-CM

## 2018-06-29 NOTE — Telephone Encounter (Signed)
Spoke with patient regarding CXR per Wilber Bihari, NP recommendations.  Patient has started Z-pak.  Patient informed results were sent to [facility] and they will contact if any further recommendations.  Patient voiced understanding, no further needs at this time.

## 2018-06-30 ENCOUNTER — Ambulatory Visit: Payer: BC Managed Care – PPO

## 2018-06-30 NOTE — Progress Notes (Signed)
Disability successfully faxed to Doristine Johns at [phone]. Mailed copy to patient address on file.

## 2018-07-05 ENCOUNTER — Encounter: Payer: Self-pay | Admitting: Adult Health

## 2018-07-05 ENCOUNTER — Inpatient Hospital Stay: Payer: BC Managed Care – PPO

## 2018-07-05 ENCOUNTER — Other Ambulatory Visit: Payer: BC Managed Care – PPO

## 2018-07-05 ENCOUNTER — Inpatient Hospital Stay (HOSPITAL_BASED_OUTPATIENT_CLINIC_OR_DEPARTMENT_OTHER): Payer: BC Managed Care – PPO | Admitting: Adult Health

## 2018-07-05 ENCOUNTER — Ambulatory Visit: Payer: BC Managed Care – PPO

## 2018-07-05 VITALS — BP 137/70 | HR 87 | Temp 98.5°F | Resp 17 | Ht 62.0 in | Wt 252.6 lb

## 2018-07-05 DIAGNOSIS — C50411 Malignant neoplasm of upper-outer quadrant of right female breast: Secondary | ICD-10-CM | POA: Diagnosis not present

## 2018-07-05 DIAGNOSIS — I10 Essential (primary) hypertension: Secondary | ICD-10-CM | POA: Diagnosis not present

## 2018-07-05 DIAGNOSIS — B20 Human immunodeficiency virus [HIV] disease: Secondary | ICD-10-CM

## 2018-07-05 DIAGNOSIS — Z170 Estrogen receptor positive status [ER+]: Secondary | ICD-10-CM

## 2018-07-05 DIAGNOSIS — Z79899 Other long term (current) drug therapy: Secondary | ICD-10-CM

## 2018-07-05 LAB — CBC WITH DIFFERENTIAL (CANCER CENTER ONLY)
Abs Immature Granulocytes: 0.06 10*3/uL (ref 0.00–0.07)
Basophils Absolute: 0 10*3/uL (ref 0.0–0.1)
Basophils Relative: 0 %
Eosinophils Absolute: 0 10*3/uL (ref 0.0–0.5)
Eosinophils Relative: 0 %
HCT: 30.6 % — ABNORMAL LOW (ref 36.0–46.0)
Hemoglobin: 9.5 g/dL — ABNORMAL LOW (ref 12.0–15.0)
Immature Granulocytes: 1 %
Lymphocytes Relative: 50 %
Lymphs Abs: 2.4 10*3/uL (ref 0.7–4.0)
MCH: 29.9 pg (ref 26.0–34.0)
MCHC: 31 g/dL (ref 30.0–36.0)
MCV: 96.2 fL (ref 80.0–100.0)
Monocytes Absolute: 0.6 10*3/uL (ref 0.1–1.0)
Monocytes Relative: 12 %
Neutro Abs: 1.9 10*3/uL (ref 1.7–7.7)
Neutrophils Relative %: 37 %
Platelet Count: 407 10*3/uL — ABNORMAL HIGH (ref 150–400)
RBC: 3.18 MIL/uL — ABNORMAL LOW (ref 3.87–5.11)
RDW: 18.2 % — ABNORMAL HIGH (ref 11.5–15.5)
WBC Count: 5 10*3/uL (ref 4.0–10.5)
nRBC: 0 % (ref 0.0–0.2)

## 2018-07-05 LAB — CMP (CANCER CENTER ONLY)
ALT: 20 U/L (ref 0–44)
AST: 23 U/L (ref 15–41)
Albumin: 3.4 g/dL — ABNORMAL LOW (ref 3.5–5.0)
Alkaline Phosphatase: 182 U/L — ABNORMAL HIGH (ref 38–126)
Anion gap: 12 (ref 5–15)
BUN: 10 mg/dL (ref 6–20)
CO2: 28 mmol/L (ref 22–32)
Calcium: 9.5 mg/dL (ref 8.9–10.3)
Chloride: 101 mmol/L (ref 98–111)
Creatinine: 0.89 mg/dL (ref 0.44–1.00)
GFR, Est AFR Am: >60 mL/min (ref >60)
GFR, Estimated: >60 mL/min (ref >60)
Glucose, Bld: 167 mg/dL — ABNORMAL HIGH (ref 70–99)
Potassium: 4 mmol/L (ref 3.5–5.1)
Sodium: 141 mmol/L (ref 135–145)
Total Bilirubin: <0.2 mg/dL — ABNORMAL LOW (ref 0.3–1.2)
Total Protein: 7.6 g/dL (ref 6.5–8.1)

## 2018-07-05 MED ORDER — SODIUM CHLORIDE 0.9 % IV SOLN
675.0000 mg | Freq: Once | INTRAVENOUS | Status: AC
  Administered 2018-07-05: 680 mg via INTRAVENOUS
  Filled 2018-07-05: qty 68

## 2018-07-05 MED ORDER — SODIUM CHLORIDE 0.9% FLUSH
10.0000 mL | INTRAVENOUS | Status: DC | PRN
  Administered 2018-07-05: 10 mL
  Filled 2018-07-05: qty 10

## 2018-07-05 MED ORDER — DIPHENHYDRAMINE HCL 25 MG PO CAPS
25.0000 mg | ORAL_CAPSULE | Freq: Once | ORAL | Status: AC
  Administered 2018-07-05: 25 mg via ORAL

## 2018-07-05 MED ORDER — PALONOSETRON HCL INJECTION 0.25 MG/5ML
INTRAVENOUS | Status: AC
  Filled 2018-07-05: qty 5

## 2018-07-05 MED ORDER — PALONOSETRON HCL INJECTION 0.25 MG/5ML
0.2500 mg | Freq: Once | INTRAVENOUS | Status: AC
  Administered 2018-07-05: 0.25 mg via INTRAVENOUS

## 2018-07-05 MED ORDER — TRASTUZUMAB CHEMO 150 MG IV SOLR
6.0000 mg/kg | Freq: Once | INTRAVENOUS | Status: AC
  Administered 2018-07-05: 714 mg via INTRAVENOUS
  Filled 2018-07-05: qty 34

## 2018-07-05 MED ORDER — SODIUM CHLORIDE 0.9 % IV SOLN
700.0000 mg/m2 | Freq: Once | INTRAVENOUS | Status: AC
  Administered 2018-07-05: 1596 mg via INTRAVENOUS
  Filled 2018-07-05: qty 41.98

## 2018-07-05 MED ORDER — DIPHENHYDRAMINE HCL 25 MG PO CAPS
ORAL_CAPSULE | ORAL | Status: AC
  Filled 2018-07-05: qty 1

## 2018-07-05 MED ORDER — SODIUM CHLORIDE 0.9 % IV SOLN
Freq: Once | INTRAVENOUS | Status: AC
  Administered 2018-07-05: 09:00:00 via INTRAVENOUS
  Filled 2018-07-05: qty 5

## 2018-07-05 MED ORDER — ACETAMINOPHEN 325 MG PO TABS
ORAL_TABLET | ORAL | Status: AC
  Filled 2018-07-05: qty 2

## 2018-07-05 MED ORDER — HEPARIN SOD (PORK) LOCK FLUSH 100 UNIT/ML IV SOLN
500.0000 [IU] | Freq: Once | INTRAVENOUS | Status: AC | PRN
  Administered 2018-07-05: 500 [IU]
  Filled 2018-07-05: qty 5

## 2018-07-05 MED ORDER — SODIUM CHLORIDE 0.9 % IV SOLN
Freq: Once | INTRAVENOUS | Status: AC
  Administered 2018-07-05: 09:00:00 via INTRAVENOUS
  Filled 2018-07-05: qty 250

## 2018-07-05 MED ORDER — ACETAMINOPHEN 325 MG PO TABS
650.0000 mg | ORAL_TABLET | Freq: Once | ORAL | Status: AC
  Administered 2018-07-05: 650 mg via ORAL

## 2018-07-05 NOTE — Patient Instructions (Signed)
[facility] Discharge Instructions for Patients Receiving Chemotherapy  Today you received the following chemotherapy agents: Herceptin, Gemzar, & Carboplatin.  To help prevent nausea and vomiting after your treatment, we encourage you to take your nausea medication as prescribed.   If you develop nausea and vomiting that is not controlled by your nausea medication, call the clinic.   BELOW ARE SYMPTOMS THAT SHOULD BE REPORTED IMMEDIATELY:  *FEVER GREATER THAN 100.5 F  *CHILLS WITH OR WITHOUT FEVER  NAUSEA AND VOMITING THAT IS NOT CONTROLLED WITH YOUR NAUSEA MEDICATION  *UNUSUAL SHORTNESS OF BREATH  *UNUSUAL BRUISING OR BLEEDING  TENDERNESS IN MOUTH AND THROAT WITH OR WITHOUT PRESENCE OF ULCERS  *URINARY PROBLEMS  *BOWEL PROBLEMS  UNUSUAL RASH Items with * indicate a potential emergency and should be followed up as soon as possible.  Feel free to call the clinic should you have any questions or concerns. The clinic phone number is [phone].  Please show the [facility] at check-in to the Emergency Department and triage nurse.

## 2018-07-05 NOTE — Progress Notes (Signed)
Location of Breast Cancer: Right Breast  Histology per Pathology Report:  01/11/18 Diagnosis 1. Breast, right, needle core biopsy, 9:30 o'clock ribbon clip - INVASIVE DUCTAL CARCINOMA. - SEE COMMENT. 2. Lymph node, needle/core biopsy, right axillary ln spiral hydromark clip - THERE IS NO EVIDENCE OF CARCINOMA IN 1 OF 1 LYMPH NODE (0/1).  Receptor Status: ER(60%), PR (10%), Her2-neu (POS), Ki-(30%)  02/23/18 Diagnosis 1. 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 - CARCINOMA IS FOCALLY 0.1 CM TO THE MEDIAL MARGIN. - SEE ONCOLOGY TABLE BELOW. 2. Lymph node, sentinel, biopsy, Right axillary #1 - THERE IS NO EVIDENCE OF CARCINOMA IN 1 OF 1 LYMPH NODE (0/1) 3. Lymph node, sentinel, biopsy, Right axillary #2 - METASTATIC CARCINOMA IN 1 OF 1 LYMPH NODE (1/1).  Did patient present with symptoms or was this found on screening mammography?: She presented with breast abnormality on the following imaging: unilateralrightdiagnostic mammography with tomography andrightbreast ultrasonography at [facility] on 01/05/2018 after she self-palpated a mass on 12/23/17.  Past/Anticipated interventions by surgeon, if any: 02/23/18 Dr. Barry Dienes Left subclavian port a cath, Right Breast Radioactive seed localized lumpectomy and sentinel lymph node mapping and biopsy  04/21/18 PROCEDURE:  Procedure(s): Port revision SURGEON:  Surgeon(s): Stark Klein, MD  Past/Anticipated interventions by medical oncology, if any: 07/05/18 Wilber Bihari NP -adjuvant chemotherapy consisting of carboplatin, docetaxel, trastuzumab and Pertuzumab every 21 days x 6 03/08/2018-07/05/2018             (a) pertuzumab stopped after cycle 1 due to diarrhea             (b) docetaxel changed to gemcitabine after cycle 2 due to persistent hyperglycemia             (c) Gemcitabine and Carboplatin dose reduced by approximately 10% due to delayed neutropenia and thrombocytopenia   -anti-HER-2 immunotherapy to start concurrently with chemotherapy             (a) baseline echocardiogram on 02/07/2018 showed an ejection fraction in the 55-60% range             (b) repeat echo 06/19/2018 showed an ejection fraction of 55-60% - adjuvant radiation to follow - antiestrogens to follow at the completion of local treatment   Lymphedema issues, if any:  She feels like she may have some. She tells me that Dr. Barry Dienes plans to schedule her to see PT.   Pain issues, if any:   She reports sharp sudden pains to her right breast at times.   SAFETY ISSUES:  Prior radiation? No  Pacemaker/ICD? No  Possible current pregnancy? No, she is not having periods. She has had a tubal ligation and partial hysterectomy.   Is the patient on methotrexate? No  Current Complaints / other details:    BP 121/90 (BP Location: Left Arm, Patient Position: Sitting)   Pulse 98   Temp 98.3 F (36.8 C) (Oral)   Resp 18   Ht 5' 2"  (1.575 m)   Wt 249 lb (112.9 kg)   LMP 06/21/2010   SpO2 100%   BMI 45.54 kg/m    Wt Readings from Last 3 Encounters:  07/11/18 249 lb (112.9 kg)  07/05/18 252 lb 9.6 oz (114.6 kg)  06/27/18 246 lb 5 oz (111.7 kg)      Kelly Rowe, Kelly Police, RN 07/05/2018,9:52 AM

## 2018-07-05 NOTE — Progress Notes (Signed)
[facility]  Telephone:(336) [phone] Fax:(336) [phone]     ID: Kelly Rowe DOB: 1970-11-14  MR#: [id number]  YQM#:[id number]  Patient Care Team: Biagio Borg, MD as PCP - [name], MD as Consulting Physician (General Surgery) [name], [name], MD as Consulting Physician (Oncology) [name], MD as Attending Physician (Radiation Oncology) [name], NP as Nurse Practitioner (Obstetrics and Gynecology) Marlinda Mike, PA-C as Referring Physician (Physician Assistant) OTHER MD:   CHIEF COMPLAINT: Triple positive breast cancer  CURRENT TREATMENT: adjuvant chemotherapy   HISTORY OF CURRENT ILLNESS: From the original intake note:  "Kelly Rowe" noticed bruising in her lateral right breast and palpated a mass  on 12/23/2017. She followed up with her gynecologist. She underwent unilateral right diagnostic mammography with tomography and right breast ultrasonography at [facility] on 01/05/2018 showing: breast density category B. There is an irregular highly suspicious mass within the right breast at the 9:30 o'clock upper outer quadrant, measuring 1.8 x 1.1 x 1.5 cm, and located 18 cm from the nipple,  Ultrasonography revealed a single morphologically abnormal lymph node in the RIGHT axilla, with cortical thickness of 6 mm.   Accordingly on 01/11/2018 she proceeded to biopsy of the right breast area in question as well as a suspicious lymph node. The pathology from this procedure showed ([id number]): Invasive ductal carcinoma grade III.  The lymph node biopsied was negative for carcinoma (concordant).. Prognostic indicators significant for: estrogen receptor, 60% positive with weak staining intensity and progesterone receptor, 10% positive with strong staining intensity. Proliferation marker Ki67 at 30%. HER2 amplified with ratios HER2/CEP17 signals 2.26 and average HER2 copies per cell 3.50  The patient's subsequent history is as detailed  below.  INTERVAL HISTORY: Kelly Rowe returns today for follow up and treatment of her triple positive breast cancer. She is receiving adjuvant chemotherapy with Gemcitabine (previously Docetaxel), Carboplatin, and Trastuzumab,  given on day 1 of every 21 day cycle. She receives [place] support on day 3. Today is day 1 cycle 6.   We held her chemo last week due to URI, and ? Early pneumonia on xray.    REVIEW OF SYSTEMS: Kelly Rowe is feeling better today.  She completed her Zpak I sent in last week and tolerated it well.  She still has an occasional cough with phlegm, but no other issues.  She denies fevers or chills, pleuritic chest pain, or shortness of breath.  She is tired this morning because her kids woke her up at 2am and she has been unable to go back to sleep.  She did not get any medications called in from her infectious disease team.  She is feeling well.    She denies headaches or unusual vision changes.  She is without nausea, vomiting, constipation, diarrhea, bladder changes.  She notes she is eating excess sugar before she comes in for her appointments.  She is not exercising.  A detailed ROS was otherwise non contributory.    PAST MEDICAL HISTORY: Past Medical History:  Diagnosis Date  . ANEMIA-IRON DEFICIENCY 01/30/2010  . ANXIETY 11/27/2007  . Arthritis   . Asthma 02/25/2011  . Cancer ([place])    breast  . Carbuncle and furuncle of trunk 04/16/2010  . Chlamydia infection 03/21/2008  . DIABETES MELLITUS, TYPE II 08/02/2007  . Edema 01/30/2010  . ELEVATED BLOOD PRESSURE WITHOUT DIAGNOSIS OF HYPERTENSION 08/02/2007  . Family history of breast cancer   . Family history of colon cancer   . Family history of lung cancer   .  FREQUENCY, URINARY 05/19/2009  . GENITAL HERPES 03/12/2009  . GERD (gastroesophageal reflux disease)   . History of kidney stones   . HIV (human immunodeficiency virus infection) ([place]) 2009  . HIV INFECTION 03/12/2009  . HSV (herpes simplex virus) infection   .  HYPERLIPIDEMIA 08/02/2007  . Hypertension   . Metrorrhagia 03/21/2008  . PONV (postoperative nausea and vomiting)    woke up crying per patient   . RETENTION, URINE 05/19/2009  . Trichomonas infection 01/19/2010  . VITAMIN D DEFICIENCY 01/30/2010    PAST SURGICAL HISTORY: Past Surgical History:  Procedure Laterality Date  . ABDOMINAL HYSTERECTOMY  07/22/2010   TAH WITH PRESERVATION OF BOTH TUBES AND OVARIES  . BREAST LUMPECTOMY WITH RADIOACTIVE SEED AND SENTINEL LYMPH NODE BIOPSY Right 02/23/2018   Procedure: BREAST LUMPECTOMY WITH RADIOACTIVE SEED AND SENTINEL LYMPH NODE BIOPSY;  Surgeon: Stark Klein, MD;  Location: [facility];  Service: General;  Laterality: Right;  . CHOLECYSTECTOMY    . ENDOMETRIAL ABLATION  01/11/2008   HER OPTION  . ESOPHAGOGASTRODUODENOSCOPY    . MULTIPLE TOOTH EXTRACTIONS    . PORTACATH PLACEMENT Left 02/23/2018   Procedure: INSERTION PORT-A-CATH;  Surgeon: Stark Klein, MD;  Location: [facility];  Service: General;  Laterality: Left;  . PORTACATH PLACEMENT N/A 04/21/2018   Procedure: PORT-A-CATH REVISION;  Surgeon: Stark Klein, MD;  Location: [facility];  Service: General;  Laterality: N/A;  . TUBAL LIGATION    . WISDOM TOOTH EXTRACTION      FAMILY HISTORY Family History  Problem Relation Age of Onset  . Prostate cancer Other   . Heart disease Other   . Stroke Other   . Diabetes Mother   . Hypertension Mother   . Diabetes Father   . Cancer Father        COLON and LU NG  . Stroke Paternal Uncle   . Breast cancer Paternal Aunt   . Lung cancer Maternal Grandmother 39       Mesothelioma  . Colon cancer Paternal Grandfather        dx over 29s  . Lung cancer Paternal Aunt   . Breast cancer Paternal Aunt   . Breast cancer Paternal Aunt   . Heart attack Maternal Grandfather 71  . Colon cancer Other        MGM's 5 brothers  The patient's father died at age 62 due to metastatic liver cancer. The patient's mother is alive at 71. The patient's has 1 brother and 3  sisters. There was a paternal grandfather with colon cancer. There were 3 paternal aunts with breast cancer, 2 diagnosed in the 23's and 1 at age 82. There was a maternal grandmother with mesothelioma. The patient otherwise denies a history of ovarian cancer in the family.    GYNECOLOGIC HISTORY:  Patient's last menstrual period was 06/21/2010. Menarche: 47 years old Age at first live birth: 47 years old She is GXP5. Her LMP was December 2011. She is status post partial hysterectomy without oophorectomy. She took oral contraception for 3 years with no complications. She never took HRT.    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  At home are 2 of her sons, Glendell Docker and [name], and the patient's grandson, Amador Cunas 6 (who is Kelly Rowe). The patient's oldest is [place] age 46 who lives in [place] as a cook. Daughter, [place] age 56 works as a Scientist, water quality. Rowe, Glendell Docker age 29 is disabled. Rowe, [name] age 44 lives in [place] as a Microbiologist. Daughter, Benard Rink age  22 also is a Microbiologist. The patient has 5 grandchildren and no great grandchildren. The patient does not belong to a church.        ADVANCED DIRECTIVES: Not in place; at the 01/18/2018 visit the patient was given the appropriate documents to complete on notarized at her discretion   HEALTH MAINTENANCE: Social History   Tobacco Use  . Smoking status: Never Smoker  . Smokeless tobacco: Never Used  Substance Use Topics  . Alcohol use: Never    Alcohol/week: 0.0 standard drinks    Frequency: Never  . Drug use: No     Colonoscopy: Not yet  PAP: November 2018  Bone density: Never   Allergies  Allergen Reactions  . Levaquin [Levofloxacin In D5w] Nausea And Vomiting and Rash  . Penicillins Swelling and Rash    Has patient had a PCN reaction causing immediate rash, facial/tongue/throat swelling, SOB or lightheadedness with hypotension: Yes Has patient had a PCN reaction causing severe rash involving mucus membranes or skin  necrosis: No Has patient had a PCN reaction that required hospitalization: Yes Has patient had a PCN reaction occurring within the last 10 years: No If all of the above answers are "NO", then may proceed with Cephalosporin use.    Current Outpatient Medications  Medication Sig Dispense Refill  . albuterol (PROVENTIL HFA;VENTOLIN HFA) 108 (90 Base) MCG/ACT inhaler Inhale 2 puffs into the lungs every 6 (six) hours as needed for wheezing or shortness of breath.    Marland Kitchen aspirin 81 MG tablet Take 81 mg by mouth daily as needed (chest pain).     . Azelastine-Fluticasone (DYMISTA) 137-50 MCG/ACT SUSP Use as directed 1 spray each side twice per day as needed 23 g 5  . azithromycin (ZITHROMAX) 250 MG tablet 2 tabs on day 1 then 1 tab daily until complete 6 each 0  . Beclomethasone Dipropionate (QNASL) 80 MCG/ACT AERS Place 2 sprays into the nose daily. 1 Inhaler 1  . bictegravir-emtricitabine-tenofovir AF (BIKTARVY) 50-200-25 MG TABS tablet Take 1 tablet by mouth daily.    . blood glucose meter kit and supplies Dispense based on patient and insurance preference. Use up to four times daily as directed. (FOR ICD-10 E10.9, E11.9). 1 each 0  . Diclofenac Sodium (PENNSAID) 2 % SOLN Place 1 application onto the skin 2 (two) times daily. (Patient taking differently: Place 1 application onto the skin 2 (two) times daily as needed. ) 1 Bottle 3  . Dulaglutide (TRULICITY) 1.49 YO/3.7CH SOPN 0.5 ml weekly SQ 6 mL 3  . famotidine (PEPCID) 20 MG tablet Take 1 tablet (20 mg total) by mouth 2 (two) times daily. (Patient taking differently: Take 20 mg by mouth daily as needed for heartburn. ) 60 tablet 3  . ferrous gluconate (IRON 27) 240 (27 FE) MG tablet Take 240 mg by mouth daily as needed (iron).     Marland Kitchen glipiZIDE (GLIPIZIDE XL) 5 MG 24 hr tablet Take 1 tablet (5 mg total) by mouth daily with breakfast. 90 tablet 3  . ibuprofen (ADVIL,MOTRIN) 800 MG tablet Take 1 tablet (800 mg total) by mouth every 8 (eight) hours as  needed. (Patient taking differently: Take 800 mg by mouth daily as needed for moderate pain. ) 60 tablet 0  . lidocaine-prilocaine (EMLA) cream Apply to affected area once (Patient taking differently: Apply 1 application topically once. Apply to affected area once) 30 g 3  . loratadine (CLARITIN) 10 MG tablet Take 10 mg by mouth daily.    Marland Kitchen LORazepam (ATIVAN)  0.5 MG tablet Take 1 tablet (0.5 mg total) by mouth at bedtime as needed (Nausea or vomiting). 30 tablet 0  . metFORMIN (GLUCOPHAGE-XR) 500 MG 24 hr tablet Take 2 tablets (1,000 mg total) by mouth daily with breakfast. Annual appt is due must see provider for future refills 180 tablet 3  . Multiple Vitamin (MULTIVITAMIN WITH MINERALS) TABS tablet Take 1 tablet by mouth daily.    Marland Kitchen omeprazole (PRILOSEC) 40 MG capsule Take 1 capsule (40 mg total) by mouth daily. 30 capsule 0  . ondansetron (ZOFRAN) 8 MG tablet Take 1 tablet (8 mg total) by mouth every 8 (eight) hours as needed for nausea or vomiting. 20 tablet 0  . oxyCODONE (OXY IR/ROXICODONE) 5 MG immediate release tablet Take 1-2 tablets (5-10 mg total) by mouth every 6 (six) hours as needed for moderate pain, severe pain or breakthrough pain. 30 tablet 0  . prochlorperazine (COMPAZINE) 10 MG tablet Take 1 tablet (10 mg total) by mouth every 6 (six) hours as needed (Nausea or vomiting). 30 tablet 1  . valACYclovir (VALTREX) 500 MG tablet Take twice daily for 3-5 days 30 tablet 12  . zolpidem (AMBIEN) 10 MG tablet Take 1 tablet (10 mg total) by mouth at bedtime as needed for sleep. 90 tablet 1   No current facility-administered medications for this visit.    Facility-Administered Medications Ordered in Other Visits  Medication Dose Route Frequency Provider Last Rate Last Dose  . 0.9 %  sodium chloride infusion   Intravenous Once [name], [name], MD      . sodium chloride 0.9 % 1,000 mL with potassium chloride 10 mEq infusion   Intravenous Once [name], [name], MD        OBJECTIVE:    Vitals:   07/05/18 0801  BP: 137/70  Pulse: 87  Resp: 17  Temp: 98.5 F (36.9 C)  SpO2: 100%     Body mass index is 46.2 kg/m.   Wt Readings from Last 3 Encounters:  07/05/18 252 lb 9.6 oz (114.6 kg)  06/27/18 246 lb 5 oz (111.7 kg)  06/19/18 248 lb 8 oz (112.7 kg)  ECOG FS: 1 - Symptomatic but completely ambulatory GENERAL: Patient is a tired appearing female in no acute distress HEENT:  Sclerae anicteric.  Oropharynx clear and moist. No ulcerations or evidence of oropharyngeal candidiasis. Neck is supple.  NODES:  No cervical, supraclavicular, or axillary lymphadenopathy palpated.  BREAST EXAM:  Deferred, examined last week LUNGS:  Clear to auscultation bilaterally.  No wheezes or rhonchi. HEART:  Regular rate and rhythm. No murmur appreciated. ABDOMEN:  Soft, nontender.  Positive, normoactive bowel sounds. No organomegaly palpated. MSK:  No focal spinal tenderness to palpation. EXTREMITIES:  No peripheral edema.   SKIN:  Clear with no obvious rashes or skin changes. No nail dyscrasia. NEURO:  Nonfocal. Well oriented.  Appropriate affect.     LAB RESULTS:  CMP     Component Value Date/Time   NA 137 06/27/2018 1149   K 3.7 06/27/2018 1149   CL 100 06/27/2018 1149   CO2 27 06/27/2018 1149   GLUCOSE 163 (H) 06/27/2018 1149   BUN 7 06/27/2018 1149   CREATININE 0.82 06/27/2018 1149   CALCIUM 9.1 06/27/2018 1149   PROT 7.9 06/27/2018 1149   ALBUMIN 3.4 (L) 06/27/2018 1149   AST 32 06/27/2018 1149   ALT 30 06/27/2018 1149   ALKPHOS 201 (H) 06/27/2018 1149   BILITOT 0.3 06/27/2018 1149   GFRNONAA >60 06/27/2018 1149  GFRAA >60 06/27/2018 1149    No results found for: TOTALPROTELP, ALBUMINELP, A1GS, A2GS, BETS, BETA2SER, GAMS, MSPIKE, SPEI  No results found for: KPAFRELGTCHN, LAMBDASER, KAPLAMBRATIO  Lab Results  Component Value Date   WBC 5.0 07/05/2018   NEUTROABS 1.9 07/05/2018   HGB 9.5 (L) 07/05/2018   HCT 30.6 (L) 07/05/2018   MCV 96.2 07/05/2018    PLT 407 (H) 07/05/2018    _0 @  No results found for: LABCA2  No components found for: [id number]  No results for input(s): INR in the last 168 hours.  No results found for: LABCA2  No results found for: RSW546  No results found for: EVO350  No results found for: KXF818  No results found for: CA2729  No components found for: HGQUANT  No results found for: CEA1 / No results found for: CEA1   No results found for: AFPTUMOR  No results found for: [place]  No results found for: PSA1  Appointment on 07/05/2018  Component Date Value Ref Range Status  . WBC Count 07/05/2018 5.0  4.0 - 10.5 K/uL Final  . RBC 07/05/2018 3.18* 3.87 - 5.11 MIL/uL Final  . Hemoglobin 07/05/2018 9.5* 12.0 - 15.0 g/dL Final  . HCT 07/05/2018 30.6* 36.0 - 46.0 % Final  . MCV 07/05/2018 96.2  80.0 - 100.0 fL Final  . MCH 07/05/2018 29.9  26.0 - 34.0 pg Final  . MCHC 07/05/2018 31.0  30.0 - 36.0 g/dL Final  . RDW 07/05/2018 18.2* 11.5 - 15.5 % Final  . Platelet Count 07/05/2018 407* 150 - 400 K/uL Final  . nRBC 07/05/2018 0.0  0.0 - 0.2 % Final  . Neutrophils Relative % 07/05/2018 37  % Final  . Neutro Abs 07/05/2018 1.9  1.7 - 7.7 K/uL Final  . Lymphocytes Relative 07/05/2018 50  % Final  . Lymphs Abs 07/05/2018 2.4  0.7 - 4.0 K/uL Final  . Monocytes Relative 07/05/2018 12  % Final  . Monocytes Absolute 07/05/2018 0.6  0.1 - 1.0 K/uL Final  . Eosinophils Relative 07/05/2018 0  % Final  . Eosinophils Absolute 07/05/2018 0.0  0.0 - 0.5 K/uL Final  . Basophils Relative 07/05/2018 0  % Final  . Basophils Absolute 07/05/2018 0.0  0.0 - 0.1 K/uL Final  . Immature Granulocytes 07/05/2018 1  % Final  . Abs Immature Granulocytes 07/05/2018 0.06  0.00 - 0.07 K/uL Final   Performed at [facility], [address] [place], [place] [address]    (this displays the last labs from the last 3 days)  No results found for: TOTALPROTELP, ALBUMINELP, A1GS, A2GS, BETS,  BETA2SER, GAMS, MSPIKE, SPEI (this displays SPEP labs)  No results found for: KPAFRELGTCHN, LAMBDASER, KAPLAMBRATIO (kappa/lambda light chains)  No results found for: HGBA, HGBA2QUANT, HGBFQUANT, HGBSQUAN (Hemoglobinopathy evaluation)   No results found for: LDH  Lab Results  Component Value Date   IRON 35 (L) 01/30/2010   IRONPCTSAT 8.8 (L) 01/30/2010   (Iron and TIBC)  No results found for: FERRITIN  Urinalysis    Component Value Date/Time   COLORURINE RED (A) 04/01/2018 0202   APPEARANCEUR HAZY (A) 04/01/2018 0202   LABSPEC 1.021 04/01/2018 0202   PHURINE 6.0 04/01/2018 0202   GLUCOSEU >=500 (A) 04/01/2018 0202   GLUCOSEU NEGATIVE 09/27/2017 1002   HGBUR LARGE (A) 04/01/2018 0202   BILIRUBINUR NEGATIVE 04/01/2018 0202   KETONESUR 5 (A) 04/01/2018 0202   PROTEINUR 100 (A) 04/01/2018 0202   UROBILINOGEN 0.2 09/27/2017 1002   NITRITE NEGATIVE  04/01/2018 0202   LEUKOCYTESUR NEGATIVE 04/01/2018 0202     STUDIES: Dg Chest 2 View  Result Date: 06/27/2018 CLINICAL DATA:  Fever and cough.  History of breast cancer and HIV. EXAM: CHEST - 2 VIEW COMPARISON:  04/21/2018 FINDINGS: New patchy RIGHT UPPER lung opacity noted, likely representing infection. No other airspace disease, mass, pleural effusion or pneumothorax identified. The cardiomediastinal silhouette is unremarkable. A LEFT subclavian Port-A-Cath is present with tip overlying the SUPERIOR cavoatrial junction. No acute bony abnormalities are identified. RIGHT breast/axillary surgical changes again noted. IMPRESSION: Patchy RIGHT UPPER lung opacity, likely infection. Radiographic follow-up to resolution is recommended. Electronically Signed   By: Margarette Canada M.D.   On: 06/27/2018 13:42    ELIGIBLE FOR AVAILABLE RESEARCH PROTOCOL: no  ASSESSMENT: 47 y.o. [place], [name] woman status post right breast upper outer quadrant biopsy 01/11/2018 for a clinical T1 N0, stage IA invasive ductal carcinoma, grade 3, estrogen and  progesterone receptor positive, HER-2 amplified, with an MIB-1 of 30%.  (1) status post right lumpectomy and sentinel lymph node sampling 02/23/2018 for a pT2 pN1, stage IB invasive ductal carcinoma, grade 3, with close but negative margins  (2) adjuvant chemotherapy consisting of carboplatin, docetaxel, trastuzumab and Pertuzumab every 21 days x 6 03/08/2018-07/05/2018  (a) pertuzumab stopped after cycle 1 due to diarrhea  (b) docetaxel changed to gemcitabine after cycle 2 due to persistent hyperglycemia  (c) Gemcitabine and Carboplatin dose reduced by approximately 10% due to delayed neutropenia and thrombocytopenia  (3) anti-HER-2 immunotherapy to start concurrently with chemotherapy  (a) baseline echocardiogram on 02/07/2018 showed an ejection fraction in the 55-60% range  (b) repeat echo 06/19/2018 showed an ejection fraction of 55-60%  (4) adjuvant radiation to follow  (5) antiestrogens to follow at the completion of local treatment  (6) genetics testing through [facility] Multi-cancer and Breast panel on 01/13/2018 showed: no deleterious mutations. The following genes were evaluated for sequence changes and exonic deletions/duplications: ALK, APC, ATM, AXIN2, BAP1, BARD1, BLM, BMPR1A, BRCA1, BRCA2, BRIP1, CASR, CDC73, CDH1, CDK4, CDKN1B, CDKN1C,CDKN2A (p14ARF), CDKN2A (p16INK4a), CEBPA, CHEK2, CTNNA1, DICER1, DIS3L2, EPCAM*, FH, FLCN, GATA2, GPC3, GREM1*, HRAS, KIT, MAX, MEN1, MET, MLH1, MSH2, MSH3, MSH6, MUTYH, NBN, NF1, NF2, PALB2, PDGFRA, PHOX2B*, PMS2, POLD1,POLE, POT1, PRKAR1A, PTCH1, PTEN, RAD50, RAD51C, RAD51D, RB1, RECQL4, RET, RUNX1, SDHAF2, SDHB, SDHC, SDHD,SMAD4, SMARCA4, SMARCB1, SMARCE1, STK11, SUFU, TERC, TERT, TMEM127, TP53, TSC1, TSC2, VHL, WRN*, WT1.The following genes were evaluated for sequence changes only: EGFR*, HOXB13*, MITF*, NTHL1*, SDHA  PLAN: Kelly Rowe is doing much better today.  Her labs are stable and she will proceed with her sixth cycle of adjuvant  chemotherapy with Gemcitabine, Carboplatin, and Trastuzumab.  She is tolerating this well.  Kelly Rowe has ppt with Dr. Isidore Moos in radiation oncology on 12/17 to discuss radiation.  Her URI has clinically improved.  I recommended she undergo chest xray in 3 weeks when she returns for her Trastuzumab to evaluate the previous opacity to ensure clearance.    I recommended healthy diet and exercise.  Kelly Rowe will return in 3 weeks for labs, f/u with Dr. Jana Hakim, and her next Trastuzumab.  She knows to call for any questions prior to her next appt.   A total of (30) minutes of face-to-face time was spent with this patient with greater than 50% of that time in counseling and care-coordination.  Wilber Bihari, NP 07/05/18 8:15 AM Medical Oncology and Hematology [facility] [address] [place], [place] [address] Tel. [phone]    Fax. [phone]

## 2018-07-06 ENCOUNTER — Encounter: Payer: BC Managed Care – PPO | Admitting: Gastroenterology

## 2018-07-06 ENCOUNTER — Other Ambulatory Visit: Payer: BC Managed Care – PPO

## 2018-07-06 ENCOUNTER — Ambulatory Visit: Payer: BC Managed Care – PPO | Admitting: Oncology

## 2018-07-06 ENCOUNTER — Telehealth: Payer: Self-pay | Admitting: Adult Health

## 2018-07-06 NOTE — Telephone Encounter (Signed)
Per 12/11 no los

## 2018-07-07 ENCOUNTER — Inpatient Hospital Stay: Payer: BC Managed Care – PPO

## 2018-07-07 DIAGNOSIS — C50411 Malignant neoplasm of upper-outer quadrant of right female breast: Secondary | ICD-10-CM

## 2018-07-07 DIAGNOSIS — Z170 Estrogen receptor positive status [ER+]: Secondary | ICD-10-CM

## 2018-07-07 MED ORDER — PEGFILGRASTIM-CBQV 6 MG/0.6ML ~~LOC~~ SOSY
6.0000 mg | PREFILLED_SYRINGE | Freq: Once | SUBCUTANEOUS | Status: AC
  Administered 2018-07-07: 6 mg via SUBCUTANEOUS

## 2018-07-07 MED ORDER — PEGFILGRASTIM-CBQV 6 MG/0.6ML ~~LOC~~ SOSY
PREFILLED_SYRINGE | SUBCUTANEOUS | Status: AC
  Filled 2018-07-07: qty 0.6

## 2018-07-11 ENCOUNTER — Other Ambulatory Visit: Payer: Self-pay

## 2018-07-11 ENCOUNTER — Ambulatory Visit
Admission: RE | Admit: 2018-07-11 | Discharge: 2018-07-11 | Disposition: A | Discharge status: Home / Self Care | Payer: BC Managed Care – PPO | Source: Ambulatory Visit | Attending: Radiation Oncology | Admitting: Radiation Oncology

## 2018-07-11 ENCOUNTER — Encounter: Payer: Self-pay | Admitting: Radiation Oncology

## 2018-07-11 VITALS — BP 121/90 | HR 98 | Temp 98.3°F | Resp 18 | Ht 62.0 in | Wt 249.0 lb

## 2018-07-11 DIAGNOSIS — C50411 Malignant neoplasm of upper-outer quadrant of right female breast: Secondary | ICD-10-CM | POA: Insufficient documentation

## 2018-07-11 DIAGNOSIS — Z510 Encounter for antineoplastic radiation therapy: Secondary | ICD-10-CM | POA: Insufficient documentation

## 2018-07-11 DIAGNOSIS — Z79899 Other long term (current) drug therapy: Secondary | ICD-10-CM | POA: Diagnosis not present

## 2018-07-11 DIAGNOSIS — Z9221 Personal history of antineoplastic chemotherapy: Secondary | ICD-10-CM | POA: Insufficient documentation

## 2018-07-11 DIAGNOSIS — Z170 Estrogen receptor positive status [ER+]: Secondary | ICD-10-CM

## 2018-07-11 NOTE — Progress Notes (Signed)
Radiation Oncology         [phone]) [phone] ________________________________  Name: Kelly Rowe MRN: [id number]  Date: 07/11/2018  DOB: 18-Nov-1970  Follow-Up Visit Note  Outpatient  CC: Biagio Borg, MD  [name], [name], MD  Diagnosis:      ICD-10-CM   1. Malignant neoplasm of upper-outer quadrant of right breast in female, estrogen receptor positive ([place]) C50.411 Ambulatory referral to Social Work   Z17.0      Cancer Staging Malignant neoplasm of upper-outer quadrant of right breast in female, estrogen receptor positive ([place]) Staging form: Breast, AJCC 8th Edition - Clinical stage from 01/18/2018: Stage IIIB (cT1c, cN3b, cM0, G3, ER+, PR+, HER2+) - Signed by [name], MD on 07/18/2018 - Pathologic: Stage IB (pT2, pN1a, cM0, G3, ER+, PR+, HER2+) - Signed by Gardenia Phlegm, NP on 03/08/2018    CHIEF COMPLAINT: Here to discuss management of right breast cancer  Narrative: The patient returns today for follow-up. She has been doing well overall.     Since consultation date, she underwent the following imaging (dates and results as follows): MRI Bilateral breast on 01/25/2018 showed: Biopsy-proven solitary malignancy in the upper-outer quadrant of the right breast measuring 1.6 cm greatest diameter. Two prominent right axillary lymph nodes and a prominent right retropectoral lymph node for which metastatic disease cannot be excluded. Three oval T2 bright structures seen along the internal mammary chain, likely reflecting lymph nodes that are asymmetric to the left side. The largest measures approximately 5 x 3 mm short axis. No MRI evidence of malignancy in the left breast.     CT Chest on 02/06/2018 showed: Right axillary adenopathy, indicative of metastatic disease. Borderline enlarged left axillary lymph nodes, nonspecific. No additional evidence of metastatic disease.   I have reviewed her images personally.  Systemic therapy, if applicable, involved (dates and  therapy as follows): She has had neoadjuvant chemotherapy with Gemcitabine, Carboplatin, and Trastuzumab, given on day 1 of every 21 day cycle. She receives [place] support on day 3. She has finished her chemotherapy on 07/05/2018 and is continuing on Herceptin.   Breast/nodal surgery on date of 02/23/2018 revealed: tumor size of 2.4 cm; histology of right invasive ductal carcinoma; margin status to invasive disease of focally 0.1 cm to the medial margin; nodal status of Right axillary #1 with no evidence of carcinoma in 1 of 1 lymph node (0/1). Lymph node, sentinel, biopsy, Right axillary #2 with metastatic carcinoma in 1 of 1 lymph node (1/1); ER status: 60%, weak; PR status 10%, strong, Her2 status amplification was detected. The ratio was 2.26; Grade III.  Symptomatically, the patient reports: non-pruritic, sharp, darkening to right areola. She informed her surgeon, Dr. Barry Dienes about the area and was prescribed antibiotics for this. She notes that she doesn't smoke, however, her son does smoke around her.   I personally reviewed the patient's imaging prior to today's visit.                 ALLERGIES:  is allergic to levaquin [levofloxacin in d5w] and penicillins.  Meds: Current Outpatient Medications  Medication Sig Dispense Refill  . albuterol (PROVENTIL HFA;VENTOLIN HFA) 108 (90 Base) MCG/ACT inhaler Inhale 2 puffs into the lungs every 6 (six) hours as needed for wheezing or shortness of breath.    Marland Kitchen aspirin 81 MG tablet Take 81 mg by mouth daily as needed (chest pain).     . Azelastine-Fluticasone (DYMISTA) 137-50 MCG/ACT SUSP Use as directed 1 spray each side twice  per day as needed 23 g 5  . Beclomethasone Dipropionate (QNASL) 80 MCG/ACT AERS Place 2 sprays into the nose daily. 1 Inhaler 1  . bictegravir-emtricitabine-tenofovir AF (BIKTARVY) 50-200-25 MG TABS tablet Take 1 tablet by mouth daily.    . blood glucose meter kit and supplies Dispense based on patient and insurance preference. Use  up to four times daily as directed. (FOR ICD-10 E10.9, E11.9). 1 each 0  . Diclofenac Sodium (PENNSAID) 2 % SOLN Place 1 application onto the skin 2 (two) times daily. (Patient taking differently: Place 1 application onto the skin 2 (two) times daily as needed. ) 1 Bottle 3  . Dulaglutide (TRULICITY) 1.5 ZL/9.3TT SOPN 0.5 ml weekly SQ 6 mL 3  . famotidine (PEPCID) 20 MG tablet Take 1 tablet (20 mg total) by mouth 2 (two) times daily. (Patient taking differently: Take 20 mg by mouth daily as needed for heartburn. ) 60 tablet 3  . ferrous gluconate (IRON 27) 240 (27 FE) MG tablet Take 240 mg by mouth daily as needed (iron).     Marland Kitchen glipiZIDE (GLIPIZIDE XL) 5 MG 24 hr tablet Take 1 tablet (5 mg total) by mouth daily with breakfast. 90 tablet 3  . ibuprofen (ADVIL,MOTRIN) 800 MG tablet Take 1 tablet (800 mg total) by mouth every 8 (eight) hours as needed. (Patient taking differently: Take 800 mg by mouth daily as needed for moderate pain. ) 60 tablet 0  . lidocaine-prilocaine (EMLA) cream Apply to affected area once (Patient taking differently: Apply 1 application topically once. Apply to affected area once) 30 g 3  . loratadine (CLARITIN) 10 MG tablet Take 10 mg by mouth daily.    Marland Kitchen LORazepam (ATIVAN) 0.5 MG tablet Take 1 tablet (0.5 mg total) by mouth at bedtime as needed (Nausea or vomiting). 30 tablet 0  . metFORMIN (GLUCOPHAGE-XR) 500 MG 24 hr tablet Take 2 tablets (1,000 mg total) by mouth daily with breakfast. Annual appt is due must see provider for future refills 180 tablet 3  . Multiple Vitamin (MULTIVITAMIN WITH MINERALS) TABS tablet Take 1 tablet by mouth daily.    Marland Kitchen omeprazole (PRILOSEC) 40 MG capsule Take 1 capsule (40 mg total) by mouth daily. 30 capsule 0  . ondansetron (ZOFRAN) 8 MG tablet Take 1 tablet (8 mg total) by mouth every 8 (eight) hours as needed for nausea or vomiting. 20 tablet 0  . prochlorperazine (COMPAZINE) 10 MG tablet Take 1 tablet (10 mg total) by mouth every 6 (six) hours  as needed (Nausea or vomiting). 30 tablet 1  . valACYclovir (VALTREX) 500 MG tablet Take twice daily for 3-5 days 30 tablet 12  . zolpidem (AMBIEN) 10 MG tablet Take 1 tablet (10 mg total) by mouth at bedtime as needed for sleep. 90 tablet 1  . oxyCODONE (OXY IR/ROXICODONE) 5 MG immediate release tablet Take 1-2 tablets (5-10 mg total) by mouth every 6 (six) hours as needed for moderate pain, severe pain or breakthrough pain. (Patient not taking: Reported on 07/11/2018) 30 tablet 0   No current facility-administered medications for this encounter.    Facility-Administered Medications Ordered in Other Encounters  Medication Dose Route Frequency Provider Last Rate Last Dose  . 0.9 %  sodium chloride infusion   Intravenous Once [name], [name], MD      . sodium chloride 0.9 % 1,000 mL with potassium chloride 10 mEq infusion   Intravenous Once [name], [name], MD        Physical Findings:  height is  5' 2"  (1.575 m) and weight is 249 lb (112.9 kg). Her oral temperature is 98.3 F (36.8 C). Her blood pressure is 121/90 and her pulse is 98. Her respiration is 18 and oxygen saturation is 100%. .     General: Alert and oriented, in no acute distress HEENT: Head is normocephalic. Extraocular movements are intact. Oropharynx is clear. Neck: Neck is supple, no palpable cervical or supraclavicular lymphadenopathy. Heart: Regular in rate and rhythm with no murmurs, rubs, or gallops. Chest: Clear to auscultation bilaterally, with no rhonchi, wheezes, or rales. Abdomen: Soft, nontender, nondistended, with no rigidity or guarding. Extremities: No cyanosis or edema in extremities. Lymphatics: see Neck Exam Musculoskeletal: symmetric strength and muscle tone throughout. Neurologic: No obvious focalities. Speech is fluent.  Psychiatric: Judgment and insight are intact. Affect is appropriate. Breast exam reveals UOQ lumpectomy scar and axillary scar on the right that have both healed well. Mild erythema  of the right breast. No excessive warmth. No induration.   Lab Findings: Lab Results  Component Value Date   WBC 5.0 07/05/2018   HGB 9.5 (L) 07/05/2018   HCT 30.6 (L) 07/05/2018   MCV 96.2 07/05/2018   PLT 407 (H) 07/05/2018    Radiographic Findings: Dg Chest 2 View  Result Date: 06/27/2018 CLINICAL DATA:  Fever and cough.  History of breast cancer and HIV. EXAM: CHEST - 2 VIEW COMPARISON:  04/21/2018 FINDINGS: New patchy RIGHT UPPER lung opacity noted, likely representing infection. No other airspace disease, mass, pleural effusion or pneumothorax identified. The cardiomediastinal silhouette is unremarkable. A LEFT subclavian Port-A-Cath is present with tip overlying the SUPERIOR cavoatrial junction. No acute bony abnormalities are identified. RIGHT breast/axillary surgical changes again noted. IMPRESSION: Patchy RIGHT UPPER lung opacity, likely infection. Radiographic follow-up to resolution is recommended. Electronically Signed   By: Margarette Canada M.D.   On: 06/27/2018 13:42    Impression/Plan: We discussed adjuvant radiotherapy today. I recommend radiation therapy to her right breast and regional nodes in order to reduce the risks of locoregional recurrence by 2/3. The risks, benefits and side effects of this treatment were discussed in detail.  She understands that radiotherapy is associated with skin irritation and fatigue in the acute setting. Late effects can include cosmetic changes and rare injury to internal organs.   She is enthusiastic about proceeding with treatment. A consent form has been signed and placed in her chart.  I will cover the SCV, axillary, and IM nodes given the extent of suspicious adenopathy on her staging scans.  A total of 5 medically necessary complex treatment devices will be fabricated and supervised by me: 4 fields with MLCs for custom blocks to protect heart, and lungs;  and, a Vac-lok. MORE COMPLEX DEVICES MAY BE MADE IN DOSIMETRY FOR FIELD IN FIELD BEAMS  FOR DOSE HOMOGENEITY.  I have requested : 3D Simulation which is medically necessary to give adequate dose to at risk tissues while sparing lungs and heart.  I have requested a DVH of the following structures: lungs, heart, right lumpectomy cavity, esophagus, cord.  The patient will receive 50 Gy in 25 fractions to the right breast and regional nodes with 4 fields.  This will be followed by a boost.  CT simulation appointment scheduled for later today with treatment date on 07/27/2017. This was discussed with the patient during today's visit.   Will have the patient to follow up with Physical Therapy to have measurements completed of her bilateral arms to determine if a compressive sleeve would be  beneficial.     I spent 30 minutes face to face with the patient and more than 50% of that time was spent in counseling and/or coordination of care. _____________________________________   [name], MD  This document serves as a record of services personally performed by [name], MD. It was created on her behalf by Steva Colder, a trained medical scribe. The creation of this record is based on the scribe's personal observations and the provider's statements to them. This document has been checked and approved by the attending provider.

## 2018-07-12 ENCOUNTER — Encounter: Payer: Self-pay | Admitting: Radiation Oncology

## 2018-07-12 ENCOUNTER — Encounter: Payer: Self-pay | Admitting: General Practice

## 2018-07-12 NOTE — Progress Notes (Signed)
  Radiation Oncology         [phone]) [phone] ________________________________  Name: Kelly Rowe MRN: [id number]  Date: 07/11/2018  DOB: May 17, 1971  SIMULATION AND TREATMENT PLANNING NOTE    Outpatient  DIAGNOSIS:     ICD-10-CM   1. Malignant neoplasm of upper-outer quadrant of right breast in female, estrogen receptor positive ([place]) C50.411    Z17.0     NARRATIVE:  The patient was brought to the [facility].  Identity was confirmed.  All relevant records and images related to the planned course of therapy were reviewed.  The patient freely provided informed written consent to proceed with treatment after reviewing the details related to the planned course of therapy. The consent form was witnessed and verified by the simulation staff.    Then, the patient was set-up in a stable reproducible supine position for radiation therapy with her ipsilateral arm over her head, and her upper body secured in a custom-made Vac-lok device.  CT images were obtained.  Surface markings were placed.  The CT images were loaded into the planning software.    TREATMENT PLANNING NOTE: Treatment planning then occurred.  The radiation prescription was entered and confirmed.     A total of 5 medically necessary complex treatment devices were fabricated and supervised by me: 4 fields with MLCs for custom blocks to protect heart, and lungs;  and, a Vac-lok. MORE COMPLEX DEVICES MAY BE MADE IN DOSIMETRY FOR FIELD IN FIELD BEAMS FOR DOSE HOMOGENEITY.  I have requested : 3D Simulation which is medically necessary to give adequate dose to at risk tissues while sparing lungs and heart.  I have requested a DVH of the following structures: lungs, heart, lumpectomy cavity, IM nodes, esophagus, cord.    The patient will receive 50 Gy in 25 fractions to the right breast and regional nodes with 4 fields.  This will be followed by a boost.  Optical Surface Tracking Plan:  Since intensity modulated  radiotherapy (IMRT) and 3D conformal radiation treatment methods are predicated on accurate and precise positioning for treatment, intrafraction motion monitoring is medically necessary to ensure accurate and safe treatment delivery. The ability to quantify intrafraction motion without excessive ionizing radiation dose can only be performed with optical surface tracking. Accordingly, surface imaging offers the opportunity to obtain 3D measurements of patient position throughout IMRT and 3D treatments without excessive radiation exposure. I am ordering optical surface tracking for this patient's upcoming course of radiotherapy.  ________________________________   Reference:  Ursula Alert, J, et al. Surface imaging-based analysis of intrafraction motion for breast radiotherapy patients.Journal of [place], n. 6, nov. 2014. ISSN [id number].  Available at: <http://www.jacmp.org/index.php/jacmp/article/view/4957>.    -----------------------------------  [name], MD

## 2018-07-12 NOTE — Progress Notes (Signed)
[place] Psychosocial Distress Screening Clinical Social Work  Clinical Social Work was referred by distress screening protocol.  The patient scored a 8 on the Psychosocial Distress Thermometer which indicates moderate distress. Clinical Social Worker contacted patient by phone to assess for distress and other psychosocial needs. Called patient, left VM w my contact information.  Can assess for needs/resources upon return call.    ONCBCN DISTRESS SCREENING 07/11/2018  Screening Type Initial Screening  Distress experienced in past week (1-10) 8  Practical problem type Housing;Insurance;Work/school  Emotional problem type Depression;Nervousness/Anxiety;Adjusting to illness;Isolation/feeling alone;Adjusting to appearance changes  Spiritual/Religous concerns type Facing my mortality;Loss of sense of purpose  Physical Problem type Pain;Nausea/vomiting;Sleep/insomnia  Referral to support programs     Clinical Social Worker follow up needed: Yes.  Await return call.   If yes, follow up plan:  Beverely Pace, [place], LCSW Clinical Social Worker Phone:  [phone]

## 2018-07-13 ENCOUNTER — Telehealth: Payer: Self-pay | Admitting: General Practice

## 2018-07-13 NOTE — Telephone Encounter (Signed)
[facility] Psychosocial Distress Screening Clinical Social Work  Clinical Social Work was referred by distress screening protocol.  The patient scored a 8 on the Psychosocial Distress Thermometer which indicates moderate distress. Clinical Social Worker contacted patient by phone to assess for distress and other psychosocial needs. Second attempt to reach patient by phone, no answer.  Left VM w information on [facility], contact information and encouragement to call back if desired for support/resources.   ONCBCN DISTRESS SCREENING 07/11/2018  Screening Type Initial Screening  Distress experienced in past week (1-10) 8  Practical problem type Housing;Insurance;Work/school  Emotional problem type Depression;Nervousness/Anxiety;Adjusting to illness;Isolation/feeling alone;Adjusting to appearance changes  Spiritual/Religous concerns type Facing my mortality;Loss of sense of purpose  Physical Problem type Pain;Nausea/vomiting;Sleep/insomnia  Referral to support programs     Clinical Social Worker follow up needed: No.  If yes, follow up plan:  Beverely Pace, [place], LCSW Clinical Social Worker Phone:  [phone]

## 2018-07-13 NOTE — Progress Notes (Signed)
FMLA successfully faxed to Doristine Johns at [phone]. Mailed copy to patient address on file.

## 2018-07-17 ENCOUNTER — Ambulatory Visit: Payer: BC Managed Care – PPO | Admitting: Oncology

## 2018-07-17 ENCOUNTER — Other Ambulatory Visit: Payer: BC Managed Care – PPO

## 2018-07-18 ENCOUNTER — Other Ambulatory Visit: Payer: Self-pay | Admitting: Radiation Oncology

## 2018-07-18 ENCOUNTER — Encounter: Payer: Self-pay | Admitting: Radiation Oncology

## 2018-07-18 DIAGNOSIS — C50411 Malignant neoplasm of upper-outer quadrant of right female breast: Secondary | ICD-10-CM

## 2018-07-18 DIAGNOSIS — Z170 Estrogen receptor positive status [ER+]: Secondary | ICD-10-CM

## 2018-07-20 ENCOUNTER — Telehealth: Payer: Self-pay | Admitting: *Deleted

## 2018-07-20 NOTE — Telephone Encounter (Signed)
Called patient to inform of PT appt. for 07-27-18 @ 2:30 pm @ [facility], lvm for a return call

## 2018-07-20 NOTE — Telephone Encounter (Signed)
xxxx 

## 2018-07-21 DIAGNOSIS — C50411 Malignant neoplasm of upper-outer quadrant of right female breast: Secondary | ICD-10-CM | POA: Diagnosis not present

## 2018-07-22 NOTE — Progress Notes (Signed)
[facility]  [phone] [phone]) [phone]     ID: [name] DOB: Jun 19, 1971  MR#: [id number]  JYN#:[id number]  Patient Care Team: Biagio Borg, MD as PCP - [name], MD as Consulting Physician (General Surgery) Zaylynn Rickett, [name], MD as Consulting Physician (Oncology) [name], MD as Attending Physician (Radiation Oncology) [name], NP as Nurse Practitioner (Obstetrics and Gynecology) Marlinda Mike, PA-C as Referring Physician (Physician Assistant) OTHER MD:   CHIEF COMPLAINT: Triple positive breast cancer  CURRENT TREATMENT: Trastuzumab; adjuvant radiation   HISTORY OF CURRENT ILLNESS: From the original intake note:  "Kelly Rowe" noticed bruising in her lateral right breast and palpated a mass  on 12/23/2017. She followed up with her gynecologist. She underwent unilateral right diagnostic mammography with tomography and right breast ultrasonography at [facility] on 01/05/2018 showing: breast density category B. There is an irregular highly suspicious mass within the right breast at the 9:30 o'clock upper outer quadrant, measuring 1.8 x 1.1 x 1.5 cm, and located 18 cm from the nipple,  Ultrasonography revealed a single morphologically abnormal lymph node in the RIGHT axilla, with cortical thickness of 6 mm.   Accordingly on 01/11/2018 she proceeded to biopsy of the right breast area in question as well as a suspicious lymph node. The pathology from this procedure showed ([id number]): Invasive ductal carcinoma grade III.  The lymph node biopsied was negative for carcinoma (concordant).. Prognostic indicators significant for: estrogen receptor, 60% positive with weak staining intensity and progesterone receptor, 10% positive with strong staining intensity. Proliferation marker Ki67 at 30%. HER2 amplified with ratios HER2/CEP17 signals 2.26 and average HER2 copies per cell 3.50  The patient's subsequent history is as  detailed below.  INTERVAL HISTORY: Kelly Rowe returns today for follow-up of her triple positive breast cancer.   The patient completed adjuvant chemotherapy with gemcitabine (previously Docetaxel), and carboplatin, on 07/05/2018.   She continues on trastuzumab, given every 21 days, with a dose due today.   Her most recent echo was 06/19/2018 owing an ejection fraction in the 55-60% range.  Since her last visit here on 07/05/2018, she has not undergone any studies.    REVIEW OF SYSTEMS: For Christmas, Kelly Rowe's grandchildren came up from [place]. She states that her blood sugars have fluctuating from the holidays. She notes that her hair is coming back in, but there is a spot over her frontal bone that has not begun to come in just yet. The patient denies unusual headaches, visual changes, nausea, vomiting, or dizziness. There has been no unusual cough, phlegm production, or pleurisy. This been no change in bowel or bladder habits. The patient denies unexplained fatigue or unexplained weight loss, bleeding, rash, or fever. A detailed review of systems was otherwise noncontributory.    PAST MEDICAL HISTORY: Past Medical History:  Diagnosis Date  . ANEMIA-IRON DEFICIENCY 01/30/2010  . ANXIETY 11/27/2007  . Arthritis   . Asthma 02/25/2011  . Cancer ([facility])    breast  . Carbuncle and furuncle of trunk 04/16/2010  . Chlamydia infection 03/21/2008  . DIABETES MELLITUS, TYPE II 08/02/2007  . Edema 01/30/2010  . ELEVATED BLOOD PRESSURE WITHOUT DIAGNOSIS OF HYPERTENSION 08/02/2007  . Family history of breast cancer   . Family history of colon cancer   . Family history of lung cancer   . FREQUENCY, URINARY 05/19/2009  . GENITAL HERPES 03/12/2009  . GERD (gastroesophageal reflux disease)   . History of kidney stones   . HIV (human immunodeficiency virus infection) ([facility])  2009  . HIV INFECTION 03/12/2009  . HSV (herpes simplex virus) infection   . HYPERLIPIDEMIA 08/02/2007  . Hypertension   .  Metrorrhagia 03/21/2008  . PONV (postoperative nausea and vomiting)    woke up crying per patient   . RETENTION, URINE 05/19/2009  . Trichomonas infection 01/19/2010  . VITAMIN D DEFICIENCY 01/30/2010    PAST SURGICAL HISTORY: Past Surgical History:  Procedure Laterality Date  . ABDOMINAL HYSTERECTOMY  07/22/2010   TAH WITH PRESERVATION OF BOTH TUBES AND OVARIES  . BREAST LUMPECTOMY WITH RADIOACTIVE SEED AND SENTINEL LYMPH NODE BIOPSY Right 02/23/2018   Procedure: BREAST LUMPECTOMY WITH RADIOACTIVE SEED AND SENTINEL LYMPH NODE BIOPSY;  Surgeon: Stark Klein, MD;  Location: [facility];  Service: General;  Laterality: Right;  . CHOLECYSTECTOMY    . ENDOMETRIAL ABLATION  01/11/2008   HER OPTION  . ESOPHAGOGASTRODUODENOSCOPY    . MULTIPLE TOOTH EXTRACTIONS    . PORTACATH PLACEMENT Left 02/23/2018   Procedure: INSERTION PORT-A-CATH;  Surgeon: Stark Klein, MD;  Location: [facility];  Service: General;  Laterality: Left;  . PORTACATH PLACEMENT N/A 04/21/2018   Procedure: PORT-A-CATH REVISION;  Surgeon: Stark Klein, MD;  Location: [facility];  Service: General;  Laterality: N/A;  . TUBAL LIGATION    . WISDOM TOOTH EXTRACTION      FAMILY HISTORY Family History  Problem Relation Age of Onset  . Prostate cancer Other   . Heart disease Other   . Stroke Other   . Diabetes Mother   . Hypertension Mother   . Diabetes Father   . Cancer Father        COLON and LU NG  . Stroke Paternal Uncle   . Breast cancer Paternal Aunt   . Lung cancer Maternal Grandmother 54       Mesothelioma  . Colon cancer Paternal Grandfather        dx over 15s  . Lung cancer Paternal Aunt   . Breast cancer Paternal Aunt   . Breast cancer Paternal Aunt   . Heart attack Maternal Grandfather 71  . Colon cancer Other        MGM's 5 brothers   The patient's father died at age 12 due to metastatic liver cancer. The patient's mother is alive at 18. The patient's has 1 brother and 3 sisters. There was a paternal grandfather with  colon cancer. There were 3 paternal aunts with breast cancer, 2 diagnosed in the 39's and 1 at age 31. There was a maternal grandmother with mesothelioma. The patient otherwise denies a history of ovarian cancer in the family.    GYNECOLOGIC HISTORY:  Patient's last menstrual period was 06/21/2010. Menarche: 47 years old Age at first live birth: 47 years old She is GXP5. Her LMP was December 2011. She is status post partial hysterectomy without oophorectomy. She took oral contraception for 3 years with no complications. She never took HRT.    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  At home are 2 of her sons, Glendell Docker and [name], and the patient's grandson, Amador Cunas 58 (who is Willie's son). The patient's oldest is [place] age 26 who lives in [place] as a cook. Daughter, [place] age 66 works as a Scientist, water quality. Son, Glendell Docker age 61 is disabled. Son, [name] age 41 lives in [place] as a Microbiologist. Daughter, Benard Rink age 44 also is a Microbiologist. The patient has 5 grandchildren and no great grandchildren. The patient does not belong to a church.    ADVANCED DIRECTIVES: Not in  place; at the 01/18/2018 visit the patient was given the appropriate documents to complete on notarized at her discretion   HEALTH MAINTENANCE: Social History   Tobacco Use  . Smoking status: Never Smoker  . Smokeless tobacco: Never Used  Substance Use Topics  . Alcohol use: Never    Alcohol/week: 0.0 standard drinks    Frequency: Never  . Drug use: No     Colonoscopy: Not yet  PAP: November 2018  Bone density: Never   Allergies  Allergen Reactions  . Levaquin [Levofloxacin In D5w] Nausea And Vomiting and Rash  . Penicillins Swelling and Rash    Has patient had a PCN reaction causing immediate rash, facial/tongue/throat swelling, SOB or lightheadedness with hypotension: Yes Has patient had a PCN reaction causing severe rash involving mucus membranes or skin necrosis: No Has patient had a PCN reaction that  required hospitalization: Yes Has patient had a PCN reaction occurring within the last 10 years: No If all of the above answers are "NO", then may proceed with Cephalosporin use.    Current Outpatient Medications  Medication Sig Dispense Refill  . albuterol (PROVENTIL HFA;VENTOLIN HFA) 108 (90 Base) MCG/ACT inhaler Inhale 2 puffs into the lungs every 6 (six) hours as needed for wheezing or shortness of breath.    Marland Kitchen aspirin 81 MG tablet Take 81 mg by mouth daily as needed (chest pain).     . Azelastine-Fluticasone (DYMISTA) 137-50 MCG/ACT SUSP Use as directed 1 spray each side twice per day as needed 23 g 5  . Beclomethasone Dipropionate (QNASL) 80 MCG/ACT AERS Place 2 sprays into the nose daily. 1 Inhaler 1  . bictegravir-emtricitabine-tenofovir AF (BIKTARVY) 50-200-25 MG TABS tablet Take 1 tablet by mouth daily.    . blood glucose meter kit and supplies Dispense based on patient and insurance preference. Use up to four times daily as directed. (FOR ICD-10 E10.9, E11.9). 1 each 0  . Diclofenac Sodium (PENNSAID) 2 % SOLN Place 1 application onto the skin 2 (two) times daily. (Patient taking differently: Place 1 application onto the skin 2 (two) times daily as needed. ) 1 Bottle 3  . Dulaglutide (TRULICITY) 1.5 XY/5.8PF SOPN 0.5 ml weekly SQ 6 mL 3  . famotidine (PEPCID) 20 MG tablet Take 1 tablet (20 mg total) by mouth 2 (two) times daily. (Patient taking differently: Take 20 mg by mouth daily as needed for heartburn. ) 60 tablet 3  . ferrous gluconate (IRON 27) 240 (27 FE) MG tablet Take 240 mg by mouth daily as needed (iron).     Marland Kitchen glipiZIDE (GLIPIZIDE XL) 5 MG 24 hr tablet Take 1 tablet (5 mg total) by mouth daily with breakfast. 90 tablet 3  . ibuprofen (ADVIL,MOTRIN) 800 MG tablet Take 1 tablet (800 mg total) by mouth every 8 (eight) hours as needed. (Patient taking differently: Take 800 mg by mouth daily as needed for moderate pain. ) 60 tablet 0  . lidocaine-prilocaine (EMLA) cream Apply to  affected area once (Patient taking differently: Apply 1 application topically once. Apply to affected area once) 30 g 3  . loratadine (CLARITIN) 10 MG tablet Take 10 mg by mouth daily.    Marland Kitchen LORazepam (ATIVAN) 0.5 MG tablet Take 1 tablet (0.5 mg total) by mouth at bedtime as needed (Nausea or vomiting). 30 tablet 0  . metFORMIN (GLUCOPHAGE-XR) 500 MG 24 hr tablet Take 2 tablets (1,000 mg total) by mouth daily with breakfast. Annual appt is due must see provider for future refills 180 tablet 3  .  Multiple Vitamin (MULTIVITAMIN WITH MINERALS) TABS tablet Take 1 tablet by mouth daily.    Marland Kitchen omeprazole (PRILOSEC) 40 MG capsule Take 1 capsule (40 mg total) by mouth daily. 30 capsule 0  . ondansetron (ZOFRAN) 8 MG tablet Take 1 tablet (8 mg total) by mouth every 8 (eight) hours as needed for nausea or vomiting. 20 tablet 0  . oxyCODONE (OXY IR/ROXICODONE) 5 MG immediate release tablet Take 1-2 tablets (5-10 mg total) by mouth every 6 (six) hours as needed for moderate pain, severe pain or breakthrough pain. (Patient not taking: Reported on 07/11/2018) 30 tablet 0  . prochlorperazine (COMPAZINE) 10 MG tablet Take 1 tablet (10 mg total) by mouth every 6 (six) hours as needed (Nausea or vomiting). 30 tablet 1  . valACYclovir (VALTREX) 500 MG tablet Take twice daily for 3-5 days 30 tablet 12  . zolpidem (AMBIEN) 10 MG tablet Take 1 tablet (10 mg total) by mouth at bedtime as needed for sleep. 90 tablet 1   No current facility-administered medications for this visit.    Facility-Administered Medications Ordered in Other Visits  Medication Dose Route Frequency Provider Last Rate Last Dose  . 0.9 %  sodium chloride infusion   Intravenous Once Naja Apperson, [name], MD      . sodium chloride 0.9 % 1,000 mL with potassium chloride 10 mEq infusion   Intravenous Once Aadith Raudenbush, [name], MD        OBJECTIVE: Morbidly obese African-American woman who appears well  Vitals:   07/24/18 1208  BP: (!) 112/54  Pulse: 80   Resp: 18  Temp: 98.5 F (36.9 C)  SpO2: 100%     Body mass index is 46.86 kg/m.   Wt Readings from Last 3 Encounters:  07/24/18 256 lb 3.2 oz (116.2 kg)  07/11/18 249 lb (112.9 kg)  07/05/18 252 lb 9.6 oz (114.6 kg)  ECOG FS: 1 - Symptomatic but completely ambulatory  Sclerae unicteric, EOMs intact No cervical or supraclavicular adenopathy Lungs no rales or rhonchi Heart regular rate and rhythm Abd soft, nontender, positive bowel sounds MSK no focal spinal tenderness, no upper extremity lymphedema Neuro: nonfocal, well oriented, appropriate affect Breasts: Deferred     LAB RESULTS:  CMP     Component Value Date/Time   NA 139 07/24/2018 1138   K 3.6 07/24/2018 1138   CL 102 07/24/2018 1138   CO2 27 07/24/2018 1138   GLUCOSE 174 (H) 07/24/2018 1138   BUN 11 07/24/2018 1138   CREATININE 0.80 07/24/2018 1138   CALCIUM 8.6 (L) 07/24/2018 1138   PROT 7.0 07/24/2018 1138   ALBUMIN 3.4 (L) 07/24/2018 1138   AST 21 07/24/2018 1138   ALT 24 07/24/2018 1138   ALKPHOS 139 (H) 07/24/2018 1138   BILITOT 0.3 07/24/2018 1138   GFRNONAA >60 07/24/2018 1138   GFRAA >60 07/24/2018 1138    No results found for: TOTALPROTELP, ALBUMINELP, A1GS, A2GS, BETS, BETA2SER, GAMS, MSPIKE, SPEI  No results found for: KPAFRELGTCHN, LAMBDASER, KAPLAMBRATIO  Lab Results  Component Value Date   WBC 3.3 (L) 07/24/2018   NEUTROABS 1.4 (L) 07/24/2018   HGB 8.9 (L) 07/24/2018   HCT 28.2 (L) 07/24/2018   MCV 96.9 07/24/2018   PLT 119 (L) 07/24/2018    _0 @  No results found for: LABCA2  No components found for: [id number]  No results for input(s): INR in the last 168 hours.  No results found for: LABCA2  No results found for: LFY101  No results found for: BPZ025  No results found for: YTK160  No results found for: CA2729  No components found for: HGQUANT  No results found for: CEA1 / No results found for: CEA1   No results found for: AFPTUMOR  No results found  for: CHROMOGRNA  No results found for: PSA1  Appointment on 07/24/2018  Component Date Value Ref Range Status  . Sodium 07/24/2018 139  135 - 145 mmol/L Final  . Potassium 07/24/2018 3.6  3.5 - 5.1 mmol/L Final  . Chloride 07/24/2018 102  98 - 111 mmol/L Final  . CO2 07/24/2018 27  22 - 32 mmol/L Final  . Glucose, Bld 07/24/2018 174* 70 - 99 mg/dL Final  . BUN 07/24/2018 11  6 - 20 mg/dL Final  . Creatinine 07/24/2018 0.80  0.44 - 1.00 mg/dL Final  . Calcium 07/24/2018 8.6* 8.9 - 10.3 mg/dL Final  . Total Protein 07/24/2018 7.0  6.5 - 8.1 g/dL Final  . Albumin 07/24/2018 3.4* 3.5 - 5.0 g/dL Final  . AST 07/24/2018 21  15 - 41 U/L Final  . ALT 07/24/2018 24  0 - 44 U/L Final  . Alkaline Phosphatase 07/24/2018 139* 38 - 126 U/L Final  . Total Bilirubin 07/24/2018 0.3  0.3 - 1.2 mg/dL Final  . GFR, Est Non Af Am 07/24/2018 >60  >60 mL/min Final  . GFR, Est AFR Am 07/24/2018 >60  >60 mL/min Final  . Anion gap 07/24/2018 10  5 - 15 Final   Performed at [facility], [place] [address], [place], [place] [address]  . WBC Count 07/24/2018 3.3* 4.0 - 10.5 K/uL Final  . RBC 07/24/2018 2.91* 3.87 - 5.11 MIL/uL Final  . Hemoglobin 07/24/2018 8.9* 12.0 - 15.0 g/dL Final  . HCT 07/24/2018 28.2* 36.0 - 46.0 % Final  . MCV 07/24/2018 96.9  80.0 - 100.0 fL Final  . MCH 07/24/2018 30.6  26.0 - 34.0 pg Final  . MCHC 07/24/2018 31.6  30.0 - 36.0 g/dL Final  . RDW 07/24/2018 18.0* 11.5 - 15.5 % Final  . Platelet Count 07/24/2018 119* 150 - 400 K/uL Final  . nRBC 07/24/2018 0.0  0.0 - 0.2 % Final  . Neutrophils Relative % 07/24/2018 41  % Final  . Neutro Abs 07/24/2018 1.4* 1.7 - 7.7 K/uL Final  . Lymphocytes Relative 07/24/2018 44  % Final  . Lymphs Abs 07/24/2018 1.4  0.7 - 4.0 K/uL Final  . Monocytes Relative 07/24/2018 14  % Final  . Monocytes Absolute 07/24/2018 0.5  0.1 - 1.0 K/uL Final  . Eosinophils Relative 07/24/2018 0  % Final  . Eosinophils Absolute 07/24/2018 0.0   0.0 - 0.5 K/uL Final  . Basophils Relative 07/24/2018 0  % Final  . Basophils Absolute 07/24/2018 0.0  0.0 - 0.1 K/uL Final  . Immature Granulocytes 07/24/2018 1  % Final  . Abs Immature Granulocytes 07/24/2018 0.04  0.00 - 0.07 K/uL Final   Performed at [facility], [place] [address] [place], [place] [address]    (this displays the last labs from the last 3 days)  No results found for: TOTALPROTELP, ALBUMINELP, A1GS, A2GS, BETS, BETA2SER, GAMS, MSPIKE, SPEI (this displays SPEP labs)  No results found for: KPAFRELGTCHN, LAMBDASER, KAPLAMBRATIO (kappa/lambda light chains)  No results found for: HGBA, HGBA2QUANT, HGBFQUANT, HGBSQUAN (Hemoglobinopathy evaluation)   No results found for: LDH  Lab Results  Component Value Date   IRON 35 (L) 01/30/2010   IRONPCTSAT 8.8 (L) 01/30/2010   (Iron and TIBC)  No results found for: FERRITIN  Urinalysis    Component Value Date/Time   COLORURINE RED (A) 04/01/2018 0202   APPEARANCEUR HAZY (A) 04/01/2018 0202   LABSPEC 1.021 04/01/2018 0202   PHURINE 6.0 04/01/2018 0202   GLUCOSEU >=500 (A) 04/01/2018 0202   GLUCOSEU NEGATIVE 09/27/2017 1002   HGBUR LARGE (A) 04/01/2018 0202   BILIRUBINUR NEGATIVE 04/01/2018 0202   KETONESUR 5 (A) 04/01/2018 0202   PROTEINUR 100 (A) 04/01/2018 0202   UROBILINOGEN 0.2 09/27/2017 1002   NITRITE NEGATIVE 04/01/2018 0202   LEUKOCYTESUR NEGATIVE 04/01/2018 0202     STUDIES: Dg Chest 2 View  Result Date: 06/27/2018 CLINICAL DATA:  Fever and cough.  History of breast cancer and HIV. EXAM: CHEST - 2 VIEW COMPARISON:  04/21/2018 FINDINGS: New patchy RIGHT UPPER lung opacity noted, likely representing infection. No other airspace disease, mass, pleural effusion or pneumothorax identified. The cardiomediastinal silhouette is unremarkable. A LEFT subclavian Port-A-Cath is present with tip overlying the SUPERIOR cavoatrial junction. No acute bony abnormalities are identified. RIGHT  breast/axillary surgical changes again noted. IMPRESSION: Patchy RIGHT UPPER lung opacity, likely infection. Radiographic follow-up to resolution is recommended. Electronically Signed   By: Margarette Canada M.D.   On: 06/27/2018 13:42    ELIGIBLE FOR AVAILABLE RESEARCH PROTOCOL: no  ASSESSMENT: 47 y.o. [place], [name] woman status post right breast upper outer quadrant biopsy 01/11/2018 for a clinical T1 N0, stage IA invasive ductal carcinoma, grade 3, estrogen and progesterone receptor positive, HER-2 amplified, with an MIB-1 of 30%.  (1) status post right lumpectomy and sentinel lymph node sampling 02/23/2018 for a pT2 pN1, stage IB invasive ductal carcinoma, grade 3, with close but negative margins  (2) adjuvant chemotherapy consisting of carboplatin, docetaxel, trastuzumab and Pertuzumab every 21 days x 6 03/08/2018-07/05/2018 (a) pertuzumab stopped after cycle 1 due to diarrhea (b) docetaxel changed to gemcitabine after cycle 2 due to persistent hyperglycemia (c) Gemcitabine and Carboplatin dose reduced by approximately 10% due to delayed neutropenia and thrombocytopenia  (3) anti-HER-2 immunotherapy started concurrently with chemotherapy (a) baseline echocardiogram on 02/07/2018 showed an ejection fraction in the 55-60% range (b) repeat echo 06/19/2018 showed an ejection fraction of 55-60% (c) continuing trastuzumab through June 2020  (4) adjuvant radiation pending  (5) antiestrogens to follow at the completion of local treatment  (6) genetics testing through [facility] Multi-cancer and Breast panel on 01/13/2018 showed: no deleterious mutations. The following genes were evaluated for sequence changes and exonic deletions/duplications: ALK, APC, ATM, AXIN2, BAP1, BARD1, BLM, BMPR1A, BRCA1, BRCA2, BRIP1, CASR, CDC73, CDH1, CDK4, CDKN1B, CDKN1C,CDKN2A (p14ARF), CDKN2A (p16INK4a), CEBPA, CHEK2, CTNNA1, DICER1, DIS3L2, EPCAM*, FH, FLCN, GATA2, GPC3, GREM1*, HRAS, KIT, MAX, MEN1, MET, MLH1, MSH2,  MSH3, MSH6, MUTYH, NBN, NF1, NF2, PALB2, PDGFRA, PHOX2B*, PMS2, POLD1,POLE, POT1, PRKAR1A, PTCH1, PTEN, RAD50, RAD51C, RAD51D, RB1, RECQL4, RET, RUNX1, SDHAF2, SDHB, SDHC, SDHD,SMAD4, SMARCA4, SMARCB1, SMARCE1, STK11, SUFU, TERC, TERT, TMEM127, TP53, TSC1, TSC2, VHL, WRN*, WT1.The following genes were evaluated for sequence changes only: EGFR*, HOXB13*, MITF*, NTHL1*, SDHA  PLAN: Addalyne has recovered very nicely from her chemotherapy.  She now will continue trastuzumab through June of this year to complete a year.  She was a little bit confused.  She did not seem to understand that trastuzumab is not chemotherapy but a form of immunotherapy.  Accordingly she does not need her Neulasta/Udenyca, does not need to take antiemetics, and so on.  I would expect her hair will continue to grow as well.  She also did not seem to know that  she has been receiving trastuzumab for the last 6 months  She will start radiation and scheduled, to be completed 09/06/2018.  I will see her shortly after that and we will likely start anastrozole at that time  She knows to call for any other issues that may develop before the next visit.   Shawne Eskelson, [name], MD  07/24/18 12:40 PM Medical Oncology and Hematology [facility] [address] [place], [place] [address] Tel. [phone]    Fax. [phone]   I, Jacqualyn Posey am acting as a Education administrator for Chauncey Cruel, MD.   I, Lurline Del MD, have reviewed the above documentation for accuracy and completeness, and I agree with the above.

## 2018-07-24 ENCOUNTER — Inpatient Hospital Stay: Payer: BC Managed Care – PPO

## 2018-07-24 ENCOUNTER — Encounter: Payer: Self-pay | Admitting: *Deleted

## 2018-07-24 ENCOUNTER — Inpatient Hospital Stay (HOSPITAL_BASED_OUTPATIENT_CLINIC_OR_DEPARTMENT_OTHER): Payer: BC Managed Care – PPO | Admitting: Oncology

## 2018-07-24 VITALS — BP 112/54 | HR 80 | Temp 98.5°F | Resp 18 | Ht 62.0 in | Wt 256.2 lb

## 2018-07-24 DIAGNOSIS — Z170 Estrogen receptor positive status [ER+]: Secondary | ICD-10-CM

## 2018-07-24 DIAGNOSIS — C773 Secondary and unspecified malignant neoplasm of axilla and upper limb lymph nodes: Secondary | ICD-10-CM

## 2018-07-24 DIAGNOSIS — C50912 Malignant neoplasm of unspecified site of left female breast: Secondary | ICD-10-CM | POA: Diagnosis not present

## 2018-07-24 DIAGNOSIS — C50411 Malignant neoplasm of upper-outer quadrant of right female breast: Secondary | ICD-10-CM

## 2018-07-24 DIAGNOSIS — Z95828 Presence of other vascular implants and grafts: Secondary | ICD-10-CM

## 2018-07-24 DIAGNOSIS — C771 Secondary and unspecified malignant neoplasm of intrathoracic lymph nodes: Principal | ICD-10-CM

## 2018-07-24 DIAGNOSIS — C50911 Malignant neoplasm of unspecified site of right female breast: Secondary | ICD-10-CM

## 2018-07-24 LAB — CBC WITH DIFFERENTIAL (CANCER CENTER ONLY)
Abs Immature Granulocytes: 0.04 10*3/uL (ref 0.00–0.07)
Basophils Absolute: 0 10*3/uL (ref 0.0–0.1)
Basophils Relative: 0 %
Eosinophils Absolute: 0 10*3/uL (ref 0.0–0.5)
Eosinophils Relative: 0 %
HCT: 28.2 % — ABNORMAL LOW (ref 36.0–46.0)
Hemoglobin: 8.9 g/dL — ABNORMAL LOW (ref 12.0–15.0)
Immature Granulocytes: 1 %
Lymphocytes Relative: 44 %
Lymphs Abs: 1.4 10*3/uL (ref 0.7–4.0)
MCH: 30.6 pg (ref 26.0–34.0)
MCHC: 31.6 g/dL (ref 30.0–36.0)
MCV: 96.9 fL (ref 80.0–100.0)
Monocytes Absolute: 0.5 10*3/uL (ref 0.1–1.0)
Monocytes Relative: 14 %
Neutro Abs: 1.4 10*3/uL — ABNORMAL LOW (ref 1.7–7.7)
Neutrophils Relative %: 41 %
Platelet Count: 119 10*3/uL — ABNORMAL LOW (ref 150–400)
RBC: 2.91 MIL/uL — ABNORMAL LOW (ref 3.87–5.11)
RDW: 18 % — ABNORMAL HIGH (ref 11.5–15.5)
WBC Count: 3.3 10*3/uL — ABNORMAL LOW (ref 4.0–10.5)
nRBC: 0 % (ref 0.0–0.2)

## 2018-07-24 LAB — CMP (CANCER CENTER ONLY)
ALT: 24 U/L (ref 0–44)
AST: 21 U/L (ref 15–41)
Albumin: 3.4 g/dL — ABNORMAL LOW (ref 3.5–5.0)
Alkaline Phosphatase: 139 U/L — ABNORMAL HIGH (ref 38–126)
Anion gap: 10 (ref 5–15)
BUN: 11 mg/dL (ref 6–20)
CO2: 27 mmol/L (ref 22–32)
Calcium: 8.6 mg/dL — ABNORMAL LOW (ref 8.9–10.3)
Chloride: 102 mmol/L (ref 98–111)
Creatinine: 0.8 mg/dL (ref 0.44–1.00)
GFR, Est AFR Am: >60 mL/min (ref >60)
GFR, Estimated: >60 mL/min (ref >60)
Glucose, Bld: 174 mg/dL — ABNORMAL HIGH (ref 70–99)
Potassium: 3.6 mmol/L (ref 3.5–5.1)
Sodium: 139 mmol/L (ref 135–145)
Total Bilirubin: 0.3 mg/dL (ref 0.3–1.2)
Total Protein: 7 g/dL (ref 6.5–8.1)

## 2018-07-24 MED ORDER — SODIUM CHLORIDE 0.9 % IV SOLN
Freq: Once | INTRAVENOUS | Status: AC
  Administered 2018-07-24: 13:00:00 via INTRAVENOUS
  Filled 2018-07-24: qty 250

## 2018-07-24 MED ORDER — SODIUM CHLORIDE 0.9% FLUSH
10.0000 mL | INTRAVENOUS | Status: DC | PRN
  Administered 2018-07-24: 10 mL
  Filled 2018-07-24: qty 10

## 2018-07-24 MED ORDER — HEPARIN SOD (PORK) LOCK FLUSH 100 UNIT/ML IV SOLN
500.0000 [IU] | Freq: Once | INTRAVENOUS | Status: AC | PRN
  Administered 2018-07-24: 500 [IU]
  Filled 2018-07-24: qty 5

## 2018-07-24 MED ORDER — DIPHENHYDRAMINE HCL 25 MG PO CAPS
25.0000 mg | ORAL_CAPSULE | Freq: Once | ORAL | Status: AC
  Administered 2018-07-24: 25 mg via ORAL

## 2018-07-24 MED ORDER — ACETAMINOPHEN 325 MG PO TABS
650.0000 mg | ORAL_TABLET | Freq: Once | ORAL | Status: AC
  Administered 2018-07-24: 650 mg via ORAL

## 2018-07-24 MED ORDER — TRASTUZUMAB CHEMO 150 MG IV SOLR
6.0000 mg/kg | Freq: Once | INTRAVENOUS | Status: AC
  Administered 2018-07-24: 714 mg via INTRAVENOUS
  Filled 2018-07-24: qty 34

## 2018-07-24 MED ORDER — SODIUM CHLORIDE 0.9% FLUSH
10.0000 mL | Freq: Once | INTRAVENOUS | Status: AC
  Administered 2018-07-24: 10 mL
  Filled 2018-07-24: qty 10

## 2018-07-24 MED ORDER — ACETAMINOPHEN 325 MG PO TABS
ORAL_TABLET | ORAL | Status: AC
  Filled 2018-07-24: qty 2

## 2018-07-24 MED ORDER — DIPHENHYDRAMINE HCL 25 MG PO CAPS
ORAL_CAPSULE | ORAL | Status: AC
  Filled 2018-07-24: qty 1

## 2018-07-24 NOTE — Patient Instructions (Signed)
[facility] Discharge Instructions for Patients Receiving Immunotherapy  Today you received the following immunotherapy agents: Herceptin   To help prevent nausea and vomiting after your treatment, we encourage you to take your nausea medication as directed.    If you develop nausea and vomiting that is not controlled by your nausea medication, call the clinic.   BELOW ARE SYMPTOMS THAT SHOULD BE REPORTED IMMEDIATELY:  *FEVER GREATER THAN 100.5 F  *CHILLS WITH OR WITHOUT FEVER  NAUSEA AND VOMITING THAT IS NOT CONTROLLED WITH YOUR NAUSEA MEDICATION  *UNUSUAL SHORTNESS OF BREATH  *UNUSUAL BRUISING OR BLEEDING  TENDERNESS IN MOUTH AND THROAT WITH OR WITHOUT PRESENCE OF ULCERS  *URINARY PROBLEMS  *BOWEL PROBLEMS  UNUSUAL RASH Items with * indicate a potential emergency and should be followed up as soon as possible.  Feel free to call the clinic should you have any questions or concerns. The clinic phone number is [phone].  Please show the [facility] at check-in to the Emergency Department and triage nurse.

## 2018-07-27 ENCOUNTER — Ambulatory Visit: Payer: BC Managed Care – PPO | Attending: Radiation Oncology | Admitting: Physical Therapy

## 2018-07-27 ENCOUNTER — Ambulatory Visit
Admission: RE | Admit: 2018-07-27 | Discharge: 2018-07-27 | Disposition: A | Discharge status: Home / Self Care | Payer: BC Managed Care – PPO | Source: Ambulatory Visit | Attending: Radiation Oncology | Admitting: Radiation Oncology

## 2018-07-27 ENCOUNTER — Other Ambulatory Visit: Payer: Self-pay

## 2018-07-27 DIAGNOSIS — Z510 Encounter for antineoplastic radiation therapy: Secondary | ICD-10-CM | POA: Insufficient documentation

## 2018-07-27 DIAGNOSIS — C50411 Malignant neoplasm of upper-outer quadrant of right female breast: Secondary | ICD-10-CM | POA: Insufficient documentation

## 2018-07-27 DIAGNOSIS — Z483 Aftercare following surgery for neoplasm: Secondary | ICD-10-CM | POA: Diagnosis present

## 2018-07-27 DIAGNOSIS — Z170 Estrogen receptor positive status [ER+]: Secondary | ICD-10-CM | POA: Insufficient documentation

## 2018-07-27 DIAGNOSIS — I890 Lymphedema, not elsewhere classified: Secondary | ICD-10-CM | POA: Insufficient documentation

## 2018-07-27 DIAGNOSIS — R293 Abnormal posture: Secondary | ICD-10-CM

## 2018-07-27 NOTE — Therapy (Signed)
[place], [name], [address] Phone: [phone]   Fax:  [phone]  Physical Therapy Evaluation  Patient Details  Name: Kelly Rowe MRN: [id number] Date of Birth: [birth year] Referring Provider (PT): Dr. Isidore Moos   Encounter Date: 07/27/2018  PT End of Session - 07/27/18 1520    Visit Number  1    Number of Visits  4    Date for PT Re-Evaluation  08/27/18    PT Start Time  6283    PT Stop Time  1430    PT Time Calculation (min)  45 min    Activity Tolerance  Patient tolerated treatment well    Behavior During Therapy  [facility] for tasks assessed/performed       Past Medical History:  Diagnosis Date  . ANEMIA-IRON DEFICIENCY 01/30/2010  . ANXIETY 11/27/2007  . Arthritis   . Asthma 02/25/2011  . Cancer ([place])    breast  . Carbuncle and furuncle of trunk 04/16/2010  . Chlamydia infection 03/21/2008  . DIABETES MELLITUS, TYPE II 08/02/2007  . Edema 01/30/2010  . ELEVATED BLOOD PRESSURE WITHOUT DIAGNOSIS OF HYPERTENSION 08/02/2007  . Family history of breast cancer   . Family history of colon cancer   . Family history of lung cancer   . FREQUENCY, URINARY 05/19/2009  . GENITAL HERPES 03/12/2009  . GERD (gastroesophageal reflux disease)   . History of kidney stones   . HIV (human immunodeficiency virus infection) ([place]) 2009  . HIV INFECTION 03/12/2009  . HSV (herpes simplex virus) infection   . HYPERLIPIDEMIA 08/02/2007  . Hypertension   . Metrorrhagia 03/21/2008  . PONV (postoperative nausea and vomiting)    woke up crying per patient   . RETENTION, URINE 05/19/2009  . Trichomonas infection 01/19/2010  . VITAMIN D DEFICIENCY 01/30/2010    Past Surgical History:  Procedure Laterality Date  . ABDOMINAL HYSTERECTOMY  07/22/2010   TAH WITH PRESERVATION OF BOTH TUBES AND OVARIES  . BREAST LUMPECTOMY WITH RADIOACTIVE SEED AND SENTINEL LYMPH NODE BIOPSY Right 02/23/2018   Procedure: BREAST LUMPECTOMY WITH  RADIOACTIVE SEED AND SENTINEL LYMPH NODE BIOPSY;  Surgeon: Stark Klein, MD;  Location: [place];  Service: General;  Laterality: Right;  . CHOLECYSTECTOMY    . ENDOMETRIAL ABLATION  01/11/2008   HER OPTION  . ESOPHAGOGASTRODUODENOSCOPY    . MULTIPLE TOOTH EXTRACTIONS    . PORTACATH PLACEMENT Left 02/23/2018   Procedure: INSERTION PORT-A-CATH;  Surgeon: Stark Klein, MD;  Location: [place];  Service: General;  Laterality: Left;  . PORTACATH PLACEMENT N/A 04/21/2018   Procedure: PORT-A-CATH REVISION;  Surgeon: Stark Klein, MD;  [facility];  Service: General;  Laterality: N/A;  . TUBAL LIGATION    . WISDOM TOOTH EXTRACTION      There were no vitals filed for this visit.   Subjective Assessment - 07/27/18 1439    Subjective  Pt states she has some pain and swelling in her breast and so she was on an antibiotic but she is finished with that now She started her radiation therapy today     Pertinent History  right breast cancer diagnosed on Dec 23 2017, She underwent chemo and is still having to take the herceptin, she had a lumpectomy on 02/23/2018  with 2 nodes removed (1/2 positive)  She has aspiration x 2 of seroma of right breast. with the last one sometime in November .      Patient Stated Goals  to get rid of some of  the pain and fullness in her right upper arm     Currently in Pain?  Yes    Pain Score  2     Pain Location  Breast    Pain Orientation  Right    Pain Descriptors / Indicators  Sharp   she says the pain has been there a while    Pain Type  Surgical pain    Pain Radiating Towards  to right upper arm     Pain Onset  More than a month ago    Pain Frequency  Several days a week         [facility] PT Assessment - 07/27/18 0001      Assessment   Medical Diagnosis  s/p right lumpectomy and SLNB    Referring Provider (PT)  Dr. Isidore Moos    Onset Date/Surgical Date  02/23/18    Hand Dominance  Right    Prior Therapy  Baselines      Precautions   Precautions  Other (comment)     Precaution Comments  --   history of HIV     Restrictions   Weight Bearing Restrictions  No      Balance Screen   Has the patient fallen in the past 6 months  No    Has the patient had a decrease in activity level because of a fear of falling?   No    Is the patient reluctant to leave their home because of a fear of falling?   No      Home Environment   Living Environment  Private residence    Living Arrangements  Children   2 adult sons and 50 y.o. 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  Also well healed incision in right lateral breast with fullness around it.  right aereola is darker than right and pt states that is new since surgery and she has only had one radiation treatment     Skin Integrity  no open areas     Other Surveys   Other Surveys   lymphedema life impact scale 16.18      Observation/Other Assessments-Edema    Edema  --   fullness in right upper arm, axilla and lateral chest      Sensation   Light Touch  Not tested      Coordination   Gross Motor Movements are Fluid and Coordinated  Yes      Posture/Postural Control   Posture/Postural Control  Postural limitations    Postural Limitations  Rounded Shoulders;Forward head      ROM / Strength   AROM / PROM / Strength  AROM;Strength      AROM   Right Shoulder Extension  --    Right Shoulder Flexion  160 Degrees    Right Shoulder ABduction  160 Degrees    Right Shoulder Internal Rotation  90 Degrees  Right Shoulder External  Rotation  --      Strength   Overall Strength  Within functional limits for tasks performed        LYMPHEDEMA/ONCOLOGY QUESTIONNAIRE - 07/27/18 1456      Right Upper Extremity Lymphedema   15 cm Proximal to Olecranon Process  43 cm    10 cm Proximal to Olecranon Process  39 cm    Olecranon Process  28 cm    10 cm Proximal to Ulnar Styloid Process  26 cm    Just Proximal to Ulnar Styloid Process  17.3 cm    Across Hand at [employer]  20.5 cm    At [place] of 2nd Digit  6.5 cm      Left Upper Extremity Lymphedema   10 cm Proximal to Olecranon Process  3699 cm    Olecranon Process  27 cm    10 cm Proximal to Ulnar Styloid Process  25.6 cm    Just Proximal to Ulnar Styloid Process  16.5 cm    Across Hand at [employer]  20 cm    At [place] of 2nd Digit  6 cm             Outpatient Re[facility]27/[facility]  Lymphedema Life Impact Scale Total Score  16.18 %      Objective measurements completed on examination: See above findings.              PT Education - 07/27/18 1520    Education Details  encouraged pt to wear compression bras to see if they would give some pain relief    Person(s) Educated  Patient    Methods  Explanation    Comprehension  Verbalized understanding          PT Long Term Goals - 07/27/18 1528      PT LONG TERM GOAL #1   Title  Patient will demonstrate she has returned ot baseline related to shoulder ROM and function.    Status  Achieved      PT LONG TERM GOAL #2   Title  Pt will be independent in manual lymph drainage and use of compression for managment of breast and axillary edema     Time  4    Period  Weeks    Status  New             Plan - 07/27/18 1521    Clinical Impression Statement  Pt has pain and swelling in right breast and right axilla with visible puffiness around the incision in her axilla. There is some fullness and tenderness to palpation in this area also.   She will return to wearing compression bras and may benefit from a foam patch or chip pack to the axillary area in addition to manual lymph drainage.  She may only be able to tolerate manual techniques to skin for a couple of weeks as she is beginning radiation .  She will then suspend MLD and return to PT if needed after radiation is complete.     History and Personal Factors relevant to plan of care:  HIV diabetes     Clinical Presentation  Stable    Clinical Decision Making  Low    Rehab Potential  Good    Clinical Impairments Affecting Rehab Potential  radiation     PT Frequency  2x / week    PT Duration  4 weeks   adjust to radiation  response    PT Treatment/Interventions  ADLs/Self Care Home Management;DME Instruction;Manual techniques;Scar mobilization;Compression bandaging;Manual lymph drainage;Taping    PT Next Visit Plan  teach MLD, assess compression bra, make chip pack/ foam patch for axilla     Consulted and Agree with Plan of Care  Patient       Patient will benefit from skilled therapeutic intervention in order to improve the following deficits and impairments:  Decreased knowledge of precautions, Impaired UE functional use, Decreased range of motion, Postural dysfunction, Pain, Obesity, Increased edema  Visit Diagnosis: Lymphedema, not elsewhere classified - Plan: PT plan of care cert/re-cert  Aftercare following surgery for neoplasm - Plan: PT plan of care cert/re-cert  Abnormal posture - Plan: PT plan of care cert/re-cert     Problem List Patient Active Problem List   Diagnosis Date Noted  . Hematochezia 03/30/2018  . Insomnia 03/30/2018  . Port-A-Cath in place 03/29/2018  . Genetic testing 01/30/2018  . Neoplasm of breast, regional lymph node staging category N3b: metastasis in ipsilateral internal mammary lymph node and axillary lymph node ([place]) 01/30/2018  . Family history of breast cancer   . Family history of colon cancer   . Family history of lung cancer    . Diabetes mellitus type 2 without retinopathy ([place]) 01/18/2018  . Morbid obesity ([place]) 01/18/2018  . Malignant neoplasm of upper-outer quadrant of right breast in female, estrogen receptor positive ([place]) 01/17/2018  . Achilles tendinitis 11/28/2017  . Hot flashes 09/30/2017  . Hypertension 09/08/2017  . Contusion of left knee and lower leg 11/18/2015  . Mild intermittent asthma 08/15/2013  . BV (bacterial vaginosis) 08/11/2012  . Right knee pain 07/05/2012  . GERD (gastroesophageal reflux disease) 07/05/2012  . GC (gonococcus) 05/30/2012  . Chlamydia 05/30/2012  . Left knee pain 01/12/2012  . Cough 11/01/2011  . Encounter for long-term (current) use of high-risk medication 02/25/2011  . Preventative health care 02/14/2011  . VITAMIN D DEFICIENCY 01/30/2010  . ANEMIA-IRON DEFICIENCY 01/30/2010  . Edema 01/30/2010  . HIV (human immunodeficiency virus infection) ([place]) 03/12/2009  . GENITAL HERPES 03/12/2009  . Anxiety state 11/27/2007  . Diabetes ([place]) 08/02/2007  . Hyperlipidemia 08/02/2007   Donato Heinz. Owens Shark PT  Norwood Levo 07/27/2018, 3:36 PM  [place], [name], [address] Phone: [phone]   Fax:  [phone]  Name: DESTANIE TIBBETTS MRN: [id number] Date of Birth: 08/13/1970

## 2018-07-28 ENCOUNTER — Ambulatory Visit
Admission: RE | Admit: 2018-07-28 | Discharge: 2018-07-28 | Disposition: A | Discharge status: Home / Self Care | Payer: BC Managed Care – PPO | Source: Ambulatory Visit | Attending: Radiation Oncology | Admitting: Radiation Oncology

## 2018-07-28 DIAGNOSIS — I890 Lymphedema, not elsewhere classified: Secondary | ICD-10-CM | POA: Diagnosis not present

## 2018-07-31 ENCOUNTER — Ambulatory Visit
Admission: RE | Admit: 2018-07-31 | Discharge: 2018-07-31 | Disposition: A | Discharge status: Home / Self Care | Payer: BC Managed Care – PPO | Source: Ambulatory Visit | Attending: Radiation Oncology | Admitting: Radiation Oncology

## 2018-07-31 DIAGNOSIS — C50411 Malignant neoplasm of upper-outer quadrant of right female breast: Secondary | ICD-10-CM

## 2018-07-31 DIAGNOSIS — Z170 Estrogen receptor positive status [ER+]: Secondary | ICD-10-CM

## 2018-07-31 DIAGNOSIS — I890 Lymphedema, not elsewhere classified: Secondary | ICD-10-CM | POA: Diagnosis not present

## 2018-07-31 MED ORDER — RADIAPLEXRX EX GEL
Freq: Once | CUTANEOUS | Status: AC
  Administered 2018-07-31: 19:00:00 via TOPICAL

## 2018-07-31 MED ORDER — ALRA NON-METALLIC DEODORANT (RAD-ONC)
1.0000 "application " | Freq: Once | TOPICAL | Status: AC
  Administered 2018-07-31: 1 via TOPICAL

## 2018-07-31 NOTE — Progress Notes (Signed)

## 2018-08-01 ENCOUNTER — Ambulatory Visit
Admission: RE | Admit: 2018-08-01 | Discharge: 2018-08-01 | Disposition: A | Discharge status: Home / Self Care | Payer: BC Managed Care – PPO | Source: Ambulatory Visit | Attending: Radiation Oncology | Admitting: Radiation Oncology

## 2018-08-01 DIAGNOSIS — I890 Lymphedema, not elsewhere classified: Secondary | ICD-10-CM | POA: Diagnosis not present

## 2018-08-02 ENCOUNTER — Ambulatory Visit: Payer: BC Managed Care – PPO | Admitting: Physical Therapy

## 2018-08-02 ENCOUNTER — Telehealth: Payer: Self-pay

## 2018-08-02 ENCOUNTER — Ambulatory Visit
Admission: RE | Admit: 2018-08-02 | Discharge: 2018-08-02 | Disposition: A | Discharge status: Home / Self Care | Payer: BC Managed Care – PPO | Source: Ambulatory Visit | Attending: Radiation Oncology | Admitting: Radiation Oncology

## 2018-08-02 ENCOUNTER — Encounter: Payer: Self-pay | Admitting: Oncology

## 2018-08-02 ENCOUNTER — Encounter: Payer: Self-pay | Admitting: Physical Therapy

## 2018-08-02 DIAGNOSIS — Z483 Aftercare following surgery for neoplasm: Secondary | ICD-10-CM

## 2018-08-02 DIAGNOSIS — R293 Abnormal posture: Secondary | ICD-10-CM

## 2018-08-02 DIAGNOSIS — I890 Lymphedema, not elsewhere classified: Secondary | ICD-10-CM

## 2018-08-02 NOTE — Patient Instructions (Signed)
Manual Lymph Drainage for Right Breast.  Do daily.  Do slowly. Use flat hands with just enough pressure to stretch the skin. Do not slide over the skin, but move the skin with the hand you're using. Lie down or sit comfortably (in a recliner, for example) to do this.  1) Hug yourself:  cross arms and do circles at collar bones near neck 5-7 times (to "wake up" lots of lymph nodes in this area). 2) Take slow deep breaths, allowing your belly to balloon out as your breathe in, 5x (to "wake up" abdominal lymph nodes to take on extra fluid). 3) Left armpit--stretch skin in small circles to stimulate intact lymph nodes there, 5-7x. 4) Right groin area, at panty line--stretch skin in small circles to stimulate lymph nodes 5-7x. 5) Redirect fluid from right chest toward left armpit (stretch skin starting at right chest in 3-4 spots working toward left armpit) 3-4x across the chest. 6) Redirect fluid from right armpit toward right groin (cup your hand around the curve of your right side and do 3-4 "pumps" from armpit to groin) 3-4x down your side. 7) Draw an imaginary diagonal line from upper outer breast through the nipple area toward lower inner breast.  Direct fluid upward and inward from this line toward the pathway across your upper chest (established in #5).  Do this in three rows to treat all of the upper inner breast tissue, and do each row 3-4x. 8) Then repeat #5 above. 9) From the imaginary diagonal, direct fluid in three rows (to treat all of lower outer breast tissue) downward and outward toward pathway established in #6 that is aimed at the right groin. 10)  Then repeat #6 above. 11)  End with repeating #3 and #4 above.   [facility] [address] [place], [place]   [address] [phone]  

## 2018-08-02 NOTE — Progress Notes (Signed)
Contacted patient regarding available copay assistance for her diagnosis through [employer]. Patient was on her way in and came in to apply in person.  Attempted to apply via portal but experienced an error.  Called [employer](Nidia) to apply via phone with patient present.  Patient approved for maximum amount of $16,000 08/02/18 though 08/02/19 with a look-back period of 02/03/18. Advised patient about pharmacy card for her accompanying medications related to her treatment. She verbalized understanding. She will receive an approval packet in the mail for her records.  Copy will be given to [facility] for billing/copay information.

## 2018-08-02 NOTE — Therapy (Addendum)
[place], [name], [address] Phone: [phone]   Fax:  [phone]  Physical Therapy Treatment  Patient Details  Name: Kelly Rowe MRN: [id number] Date of Birth: 06-16-1971 Referring Provider (PT): Dr. Isidore Moos   Encounter Date: 08/02/2018  PT End of Session - 08/02/18 2208    Visit Number  2    Number of Visits  4    Date for PT Re-Evaluation  08/27/18    PT Start Time  1314    PT Stop Time  1430    PT Time Calculation (min)  45 min    Activity Tolerance  Patient tolerated treatment well    Behavior During Therapy  [facility] for tasks assessed/performed       Past Medical History:  Diagnosis Date  . ANEMIA-IRON DEFICIENCY 01/30/2010  . ANXIETY 11/27/2007  . Arthritis   . Asthma 02/25/2011  . Cancer ([place])    breast  . Carbuncle and furuncle of trunk 04/16/2010  . Chlamydia infection 03/21/2008  . DIABETES MELLITUS, TYPE II 08/02/2007  . Edema 01/30/2010  . ELEVATED BLOOD PRESSURE WITHOUT DIAGNOSIS OF HYPERTENSION 08/02/2007  . Family history of breast cancer   . Family history of colon cancer   . Family history of lung cancer   . FREQUENCY, URINARY 05/19/2009  . GENITAL HERPES 03/12/2009  . GERD (gastroesophageal reflux disease)   . History of kidney stones   . HIV (human immunodeficiency virus infection) ([place]) 2009  . HIV INFECTION 03/12/2009  . HSV (herpes simplex virus) infection   . HYPERLIPIDEMIA 08/02/2007  . Hypertension   . Metrorrhagia 03/21/2008  . PONV (postoperative nausea and vomiting)    woke up crying per patient   . RETENTION, URINE 05/19/2009  . Trichomonas infection 01/19/2010  . VITAMIN D DEFICIENCY 01/30/2010    Past Surgical History:  Procedure Laterality Date  . ABDOMINAL HYSTERECTOMY  07/22/2010   TAH WITH PRESERVATION OF BOTH TUBES AND OVARIES  . BREAST LUMPECTOMY WITH RADIOACTIVE SEED AND SENTINEL LYMPH NODE BIOPSY Right 02/23/2018   Procedure: BREAST LUMPECTOMY WITH  RADIOACTIVE SEED AND SENTINEL LYMPH NODE BIOPSY;  Surgeon: Stark Klein, MD;  Location: [place] [facility];  Service: General;  Laterality: Right;  . CHOLECYSTECTOMY    . ENDOMETRIAL ABLATION  01/11/2008   HER OPTION  . ESOPHAGOGASTRODUODENOSCOPY    . MULTIPLE TOOTH EXTRACTIONS    . PORTACATH PLACEMENT Left 02/23/2018   Procedure: INSERTION PORT-A-CATH;  Surgeon: Stark Klein, MD;  Location: [facility];  Service: General;  Laterality: Left;  . PORTACATH PLACEMENT N/A 04/21/2018   Procedure: PORT-A-CATH REVISION;  Surgeon: Stark Klein, MD;  [facility];  Service: General;  Laterality: N/A;  . TUBAL LIGATION    . WISDOM TOOTH EXTRACTION      There were no vitals filed for this visit.               Outpatient Rehab from 07/27/2018 in [facility] [place]  Lymphedema Life Impact Scale Total Score  16.18 %           [facility] Adult PT Treatment/Exercise - 08/02/18 0001      Manual Therapy   Manual Therapy  Manual Lymphatic Drainage (MLD)    Manual Lymphatic Drainage (MLD)  in supine with head of bed elevated and breast elevated with towel roll explained the physiology of the lymph system and instructed in short neck, diaphragmatic breathing, left axillary nodes, anterior interaxillary anastamosis, right inguinal nodes , abdomen and axillo-inguinal anastamosis.  Pt cautioned not to pull on skin under the breast where it may be vulnerable to tearing if too much pulling on skin. pt cautioned not to do manua lymph drainage on radiated skin to avoid friction and pulling on the fragile skin.  Pt was able to provide return demonstration and feels that she can do it at home                   PT Long Term Goals - 08/02/18 2208      PT LONG TERM GOAL #1   Title  Patient will demonstrate she has returned ot baseline related to shoulder ROM and function.    Status  Achieved      PT LONG TERM GOAL #2   Title  Pt will be independent in manual lymph drainage and use  of compression for managment of breast and axillary edema     Time  4    Period  Weeks    Status  On-going            Plan - 08/02/18 2209    Clinical Impression Statement  Pt instructed in manual lymph drainage to right breast and was able to demonstrate understanding.  She acknowledged need to protect radiatied skin and avoid friction and pulling on the skin. Her compression bra does not cover the area around her incision so that may need to be addressed after radiation is complete     Rehab Potential  Good    Clinical Impairments Affecting Rehab Potential  radiation     PT Frequency  2x / week    PT Duration  4 weeks    PT Next Visit Plan  Reassess after radiation and check how pt is doing with MLD, assess compression bra, make chip pack/ foam patch for axilla     Consulted and Agree with Plan of Care  Patient       Patient will benefit from skilled therapeutic intervention in order to improve the following deficits and impairments:  Decreased knowledge of precautions, Impaired UE functional use, Decreased range of motion, Postural dysfunction, Pain, Obesity, Increased edema  Visit Diagnosis: Lymphedema, not elsewhere classified  Aftercare following surgery for neoplasm  Abnormal posture     Problem List Patient Active Problem List   Diagnosis Date Noted  . Hematochezia 03/30/2018  . Insomnia 03/30/2018  . Port-A-Cath in place 03/29/2018  . Genetic testing 01/30/2018  . Neoplasm of breast, regional lymph node staging category N3b: metastasis in ipsilateral internal mammary lymph node and axillary lymph node ([place]) 01/30/2018  . Family history of breast cancer   . Family history of colon cancer   . Family history of lung cancer   . Diabetes mellitus type 2 without retinopathy ([place]) 01/18/2018  . Morbid obesity ([place]) 01/18/2018  . Malignant neoplasm of upper-outer quadrant of right breast in female, estrogen receptor positive ([place]) 01/17/2018  . Achilles tendinitis  11/28/2017  . Hot flashes 09/30/2017  . Hypertension 09/08/2017  . Contusion of left knee and lower leg 11/18/2015  . Mild intermittent asthma 08/15/2013  . BV (bacterial vaginosis) 08/11/2012  . Right knee pain 07/05/2012  . GERD (gastroesophageal reflux disease) 07/05/2012  . GC (gonococcus) 05/30/2012  . Chlamydia 05/30/2012  . Left knee pain 01/12/2012  . Cough 11/01/2011  . Encounter for long-term (current) use of high-risk medication 02/25/2011  . Preventative health care 02/14/2011  . VITAMIN D DEFICIENCY 01/30/2010  . ANEMIA-IRON DEFICIENCY 01/30/2010  . Edema 01/30/2010  .  HIV (human immunodeficiency virus infection) ([place]) 03/12/2009  . GENITAL HERPES 03/12/2009  . Anxiety state 11/27/2007  . Diabetes ([place]) 08/02/2007  . Hyperlipidemia 08/02/2007   Donato Heinz. Owens Shark, PT  Norwood Levo 08/02/2018, 10:12 PM  [place], [name], [address] Phone: [phone]   Fax:  [phone]  Name: Kelly Rowe MRN: [id number] Date of Birth: 26-Nov-1970  PHYSICAL THERAPY DISCHARGE SUMMARY  Visits from Start of Care: 2  Current functional level related to goals / functional outcomes: unknown   Remaining deficits: unknown   Education / Equipment: Self manual lymph drainage Plan: Patient agrees to discharge.  Patient goals were partially met. Patient is being discharged due to not returning since the last visit.  ?????         Maudry Diego, PT 10/16/18 2:10 PM

## 2018-08-02 NOTE — Telephone Encounter (Signed)
Returned patient's call regarding disability paperwork.  Per patient form needs to be completed monthly.  Pt coming to office today, will drop off new form.  Will forward to appropriate staff.  Patient voiced understanding.

## 2018-08-03 ENCOUNTER — Ambulatory Visit
Admission: RE | Admit: 2018-08-03 | Discharge: 2018-08-03 | Disposition: A | Discharge status: Home / Self Care | Payer: BC Managed Care – PPO | Source: Ambulatory Visit | Attending: Radiation Oncology | Admitting: Radiation Oncology

## 2018-08-03 DIAGNOSIS — I890 Lymphedema, not elsewhere classified: Secondary | ICD-10-CM | POA: Diagnosis not present

## 2018-08-04 ENCOUNTER — Ambulatory Visit: Payer: BC Managed Care – PPO | Admitting: Physical Therapy

## 2018-08-04 ENCOUNTER — Ambulatory Visit
Admission: RE | Admit: 2018-08-04 | Discharge: 2018-08-04 | Disposition: A | Discharge status: Home / Self Care | Payer: BC Managed Care – PPO | Source: Ambulatory Visit | Attending: Radiation Oncology | Admitting: Radiation Oncology

## 2018-08-04 DIAGNOSIS — I890 Lymphedema, not elsewhere classified: Secondary | ICD-10-CM | POA: Diagnosis not present

## 2018-08-07 ENCOUNTER — Ambulatory Visit
Admission: RE | Admit: 2018-08-07 | Discharge: 2018-08-07 | Disposition: A | Discharge status: Home / Self Care | Payer: BC Managed Care – PPO | Source: Ambulatory Visit | Attending: Radiation Oncology | Admitting: Radiation Oncology

## 2018-08-07 DIAGNOSIS — I890 Lymphedema, not elsewhere classified: Secondary | ICD-10-CM | POA: Diagnosis not present

## 2018-08-08 ENCOUNTER — Ambulatory Visit
Admission: RE | Admit: 2018-08-08 | Discharge: 2018-08-08 | Disposition: A | Discharge status: Home / Self Care | Payer: BC Managed Care – PPO | Source: Ambulatory Visit | Attending: Radiation Oncology | Admitting: Radiation Oncology

## 2018-08-08 DIAGNOSIS — I890 Lymphedema, not elsewhere classified: Secondary | ICD-10-CM | POA: Diagnosis not present

## 2018-08-09 ENCOUNTER — Encounter: Payer: BC Managed Care – PPO | Admitting: Physical Therapy

## 2018-08-09 ENCOUNTER — Ambulatory Visit
Admission: RE | Admit: 2018-08-09 | Discharge: 2018-08-09 | Disposition: A | Discharge status: Home / Self Care | Payer: BC Managed Care – PPO | Source: Ambulatory Visit | Attending: Radiation Oncology | Admitting: Radiation Oncology

## 2018-08-09 DIAGNOSIS — I890 Lymphedema, not elsewhere classified: Secondary | ICD-10-CM | POA: Diagnosis not present

## 2018-08-10 ENCOUNTER — Ambulatory Visit
Admission: RE | Admit: 2018-08-10 | Discharge: 2018-08-10 | Disposition: A | Discharge status: Home / Self Care | Payer: BC Managed Care – PPO | Source: Ambulatory Visit | Attending: Radiation Oncology | Admitting: Radiation Oncology

## 2018-08-10 DIAGNOSIS — I890 Lymphedema, not elsewhere classified: Secondary | ICD-10-CM | POA: Diagnosis not present

## 2018-08-11 ENCOUNTER — Ambulatory Visit
Admission: RE | Admit: 2018-08-11 | Discharge: 2018-08-11 | Disposition: A | Discharge status: Home / Self Care | Payer: BC Managed Care – PPO | Source: Ambulatory Visit | Attending: Radiation Oncology | Admitting: Radiation Oncology

## 2018-08-11 DIAGNOSIS — I890 Lymphedema, not elsewhere classified: Secondary | ICD-10-CM | POA: Diagnosis not present

## 2018-08-14 ENCOUNTER — Inpatient Hospital Stay: Payer: BC Managed Care – PPO

## 2018-08-14 ENCOUNTER — Inpatient Hospital Stay: Payer: BC Managed Care – PPO | Attending: Oncology

## 2018-08-14 ENCOUNTER — Ambulatory Visit
Admission: RE | Admit: 2018-08-14 | Discharge: 2018-08-14 | Disposition: A | Discharge status: Home / Self Care | Payer: BC Managed Care – PPO | Source: Ambulatory Visit | Attending: Radiation Oncology | Admitting: Radiation Oncology

## 2018-08-14 VITALS — BP 121/59 | HR 82 | Temp 98.7°F | Resp 16

## 2018-08-14 DIAGNOSIS — C773 Secondary and unspecified malignant neoplasm of axilla and upper limb lymph nodes: Secondary | ICD-10-CM | POA: Insufficient documentation

## 2018-08-14 DIAGNOSIS — Z170 Estrogen receptor positive status [ER+]: Secondary | ICD-10-CM

## 2018-08-14 DIAGNOSIS — B20 Human immunodeficiency virus [HIV] disease: Secondary | ICD-10-CM | POA: Insufficient documentation

## 2018-08-14 DIAGNOSIS — C50411 Malignant neoplasm of upper-outer quadrant of right female breast: Secondary | ICD-10-CM

## 2018-08-14 DIAGNOSIS — Z79899 Other long term (current) drug therapy: Secondary | ICD-10-CM | POA: Insufficient documentation

## 2018-08-14 DIAGNOSIS — C50912 Malignant neoplasm of unspecified site of left female breast: Secondary | ICD-10-CM | POA: Insufficient documentation

## 2018-08-14 DIAGNOSIS — I890 Lymphedema, not elsewhere classified: Secondary | ICD-10-CM | POA: Diagnosis not present

## 2018-08-14 DIAGNOSIS — Z5112 Encounter for antineoplastic immunotherapy: Secondary | ICD-10-CM | POA: Insufficient documentation

## 2018-08-14 LAB — CMP (CANCER CENTER ONLY)
ALT: 18 U/L (ref 0–44)
AST: 16 U/L (ref 15–41)
Albumin: 3.4 g/dL — ABNORMAL LOW (ref 3.5–5.0)
Alkaline Phosphatase: 164 U/L — ABNORMAL HIGH (ref 38–126)
Anion gap: 9 (ref 5–15)
BUN: 10 mg/dL (ref 6–20)
CO2: 28 mmol/L (ref 22–32)
Calcium: 9 mg/dL (ref 8.9–10.3)
Chloride: 99 mmol/L (ref 98–111)
Creatinine: 0.8 mg/dL (ref 0.44–1.00)
GFR, Est AFR Am: >60 mL/min (ref >60)
GFR, Estimated: >60 mL/min (ref >60)
Glucose, Bld: 271 mg/dL — ABNORMAL HIGH (ref 70–99)
Potassium: 3.7 mmol/L (ref 3.5–5.1)
Sodium: 136 mmol/L (ref 135–145)
Total Bilirubin: 0.3 mg/dL (ref 0.3–1.2)
Total Protein: 7.2 g/dL (ref 6.5–8.1)

## 2018-08-14 LAB — CBC WITH DIFFERENTIAL (CANCER CENTER ONLY)
Abs Immature Granulocytes: 0.01 10*3/uL (ref 0.00–0.07)
Basophils Absolute: 0 10*3/uL (ref 0.0–0.1)
Basophils Relative: 0 %
Eosinophils Absolute: 0 10*3/uL (ref 0.0–0.5)
Eosinophils Relative: 1 %
HCT: 31.2 % — ABNORMAL LOW (ref 36.0–46.0)
Hemoglobin: 9.9 g/dL — ABNORMAL LOW (ref 12.0–15.0)
Immature Granulocytes: 0 %
Lymphocytes Relative: 29 %
Lymphs Abs: 0.8 10*3/uL (ref 0.7–4.0)
MCH: 30.1 pg (ref 26.0–34.0)
MCHC: 31.7 g/dL (ref 30.0–36.0)
MCV: 94.8 fL (ref 80.0–100.0)
Monocytes Absolute: 0.3 10*3/uL (ref 0.1–1.0)
Monocytes Relative: 12 %
Neutro Abs: 1.5 10*3/uL — ABNORMAL LOW (ref 1.7–7.7)
Neutrophils Relative %: 58 %
Platelet Count: 181 10*3/uL (ref 150–400)
RBC: 3.29 MIL/uL — ABNORMAL LOW (ref 3.87–5.11)
RDW: 14.8 % (ref 11.5–15.5)
WBC Count: 2.7 10*3/uL — ABNORMAL LOW (ref 4.0–10.5)
nRBC: 0 % (ref 0.0–0.2)

## 2018-08-14 MED ORDER — DIPHENHYDRAMINE HCL 25 MG PO CAPS
25.0000 mg | ORAL_CAPSULE | Freq: Once | ORAL | Status: AC
  Administered 2018-08-14: 25 mg via ORAL

## 2018-08-14 MED ORDER — DIPHENHYDRAMINE HCL 25 MG PO CAPS
ORAL_CAPSULE | ORAL | Status: AC
  Filled 2018-08-14: qty 1

## 2018-08-14 MED ORDER — SODIUM CHLORIDE 0.9% FLUSH
10.0000 mL | INTRAVENOUS | Status: DC | PRN
  Administered 2018-08-14: 10 mL
  Filled 2018-08-14: qty 10

## 2018-08-14 MED ORDER — HEPARIN SOD (PORK) LOCK FLUSH 100 UNIT/ML IV SOLN
500.0000 [IU] | Freq: Once | INTRAVENOUS | Status: AC | PRN
  Administered 2018-08-14: 500 [IU]
  Filled 2018-08-14: qty 5

## 2018-08-14 MED ORDER — ACETAMINOPHEN 325 MG PO TABS
ORAL_TABLET | ORAL | Status: AC
  Filled 2018-08-14: qty 2

## 2018-08-14 MED ORDER — ACETAMINOPHEN 325 MG PO TABS
650.0000 mg | ORAL_TABLET | Freq: Once | ORAL | Status: AC
  Administered 2018-08-14: 650 mg via ORAL

## 2018-08-14 MED ORDER — TRASTUZUMAB CHEMO 150 MG IV SOLR
6.0000 mg/kg | Freq: Once | INTRAVENOUS | Status: AC
  Administered 2018-08-14: 714 mg via INTRAVENOUS
  Filled 2018-08-14: qty 34

## 2018-08-14 MED ORDER — SODIUM CHLORIDE 0.9 % IV SOLN
Freq: Once | INTRAVENOUS | Status: AC
  Administered 2018-08-14: 10:00:00 via INTRAVENOUS
  Filled 2018-08-14: qty 250

## 2018-08-14 NOTE — Patient Instructions (Addendum)
[facility] Discharge Instructions for Patients Receiving Immunotherapy  Today you received the following immunotherapy agents: Herceptin   To help prevent nausea and vomiting after your treatment, we encourage you to take your nausea medication as directed.    If you develop nausea and vomiting that is not controlled by your nausea medication, call the clinic.   BELOW ARE SYMPTOMS THAT SHOULD BE REPORTED IMMEDIATELY:  *FEVER GREATER THAN 100.5 F  *CHILLS WITH OR WITHOUT FEVER  NAUSEA AND VOMITING THAT IS NOT CONTROLLED WITH YOUR NAUSEA MEDICATION  *UNUSUAL SHORTNESS OF BREATH  *UNUSUAL BRUISING OR BLEEDING  TENDERNESS IN MOUTH AND THROAT WITH OR WITHOUT PRESENCE OF ULCERS  *URINARY PROBLEMS  *BOWEL PROBLEMS  UNUSUAL RASH Items with * indicate a potential emergency and should be followed up as soon as possible.  Feel free to call the clinic should you have any questions or concerns. The clinic phone number is [phone].  Please show the [facility] at check-in to the Emergency Department and triage nurse.

## 2018-08-15 ENCOUNTER — Encounter: Payer: Self-pay | Admitting: *Deleted

## 2018-08-15 ENCOUNTER — Ambulatory Visit
Admission: RE | Admit: 2018-08-15 | Discharge: 2018-08-15 | Disposition: A | Discharge status: Home / Self Care | Payer: BC Managed Care – PPO | Source: Ambulatory Visit | Attending: Radiation Oncology | Admitting: Radiation Oncology

## 2018-08-15 ENCOUNTER — Inpatient Hospital Stay: Payer: BC Managed Care – PPO

## 2018-08-15 DIAGNOSIS — I890 Lymphedema, not elsewhere classified: Secondary | ICD-10-CM | POA: Diagnosis not present

## 2018-08-15 DIAGNOSIS — Z170 Estrogen receptor positive status [ER+]: Secondary | ICD-10-CM

## 2018-08-15 DIAGNOSIS — C50411 Malignant neoplasm of upper-outer quadrant of right female breast: Secondary | ICD-10-CM

## 2018-08-16 ENCOUNTER — Ambulatory Visit
Admission: RE | Admit: 2018-08-16 | Discharge: 2018-08-16 | Disposition: A | Discharge status: Home / Self Care | Payer: BC Managed Care – PPO | Source: Ambulatory Visit | Attending: Radiation Oncology | Admitting: Radiation Oncology

## 2018-08-16 DIAGNOSIS — I890 Lymphedema, not elsewhere classified: Secondary | ICD-10-CM | POA: Diagnosis not present

## 2018-08-17 ENCOUNTER — Ambulatory Visit
Admission: RE | Admit: 2018-08-17 | Discharge: 2018-08-17 | Disposition: A | Discharge status: Home / Self Care | Payer: BC Managed Care – PPO | Source: Ambulatory Visit | Attending: Radiation Oncology | Admitting: Radiation Oncology

## 2018-08-17 DIAGNOSIS — I890 Lymphedema, not elsewhere classified: Secondary | ICD-10-CM | POA: Diagnosis not present

## 2018-08-18 ENCOUNTER — Ambulatory Visit
Admission: RE | Admit: 2018-08-18 | Discharge: 2018-08-18 | Disposition: A | Discharge status: Home / Self Care | Payer: BC Managed Care – PPO | Source: Ambulatory Visit | Attending: Radiation Oncology | Admitting: Radiation Oncology

## 2018-08-18 DIAGNOSIS — I890 Lymphedema, not elsewhere classified: Secondary | ICD-10-CM | POA: Diagnosis not present

## 2018-08-21 ENCOUNTER — Ambulatory Visit: Payer: BC Managed Care – PPO | Admitting: Radiation Oncology

## 2018-08-21 ENCOUNTER — Ambulatory Visit
Admission: RE | Admit: 2018-08-21 | Discharge: 2018-08-21 | Disposition: A | Discharge status: Home / Self Care | Payer: BC Managed Care – PPO | Source: Ambulatory Visit | Attending: Radiation Oncology | Admitting: Radiation Oncology

## 2018-08-21 DIAGNOSIS — I890 Lymphedema, not elsewhere classified: Secondary | ICD-10-CM | POA: Diagnosis not present

## 2018-08-22 ENCOUNTER — Ambulatory Visit
Admission: RE | Admit: 2018-08-22 | Discharge: 2018-08-22 | Disposition: A | Discharge status: Home / Self Care | Payer: BC Managed Care – PPO | Source: Ambulatory Visit | Attending: Radiation Oncology | Admitting: Radiation Oncology

## 2018-08-22 DIAGNOSIS — I890 Lymphedema, not elsewhere classified: Secondary | ICD-10-CM | POA: Diagnosis not present

## 2018-08-23 ENCOUNTER — Ambulatory Visit
Admission: RE | Admit: 2018-08-23 | Discharge: 2018-08-23 | Disposition: A | Discharge status: Home / Self Care | Payer: BC Managed Care – PPO | Source: Ambulatory Visit | Attending: Radiation Oncology | Admitting: Radiation Oncology

## 2018-08-23 DIAGNOSIS — I890 Lymphedema, not elsewhere classified: Secondary | ICD-10-CM | POA: Diagnosis not present

## 2018-08-24 ENCOUNTER — Ambulatory Visit
Admission: RE | Admit: 2018-08-24 | Discharge: 2018-08-24 | Disposition: A | Discharge status: Home / Self Care | Payer: BC Managed Care – PPO | Source: Ambulatory Visit | Attending: Radiation Oncology | Admitting: Radiation Oncology

## 2018-08-24 DIAGNOSIS — I890 Lymphedema, not elsewhere classified: Secondary | ICD-10-CM | POA: Diagnosis not present

## 2018-08-25 ENCOUNTER — Ambulatory Visit
Admission: RE | Admit: 2018-08-25 | Discharge: 2018-08-25 | Disposition: A | Discharge status: Home / Self Care | Payer: BC Managed Care – PPO | Source: Ambulatory Visit | Attending: Radiation Oncology | Admitting: Radiation Oncology

## 2018-08-25 DIAGNOSIS — I890 Lymphedema, not elsewhere classified: Secondary | ICD-10-CM | POA: Diagnosis not present

## 2018-08-28 ENCOUNTER — Ambulatory Visit
Admission: RE | Admit: 2018-08-28 | Discharge: 2018-08-28 | Disposition: A | Discharge status: Home / Self Care | Payer: BC Managed Care – PPO | Source: Ambulatory Visit | Attending: Radiation Oncology | Admitting: Radiation Oncology

## 2018-08-28 DIAGNOSIS — Z170 Estrogen receptor positive status [ER+]: Secondary | ICD-10-CM | POA: Insufficient documentation

## 2018-08-28 DIAGNOSIS — Z510 Encounter for antineoplastic radiation therapy: Secondary | ICD-10-CM | POA: Insufficient documentation

## 2018-08-28 DIAGNOSIS — C50411 Malignant neoplasm of upper-outer quadrant of right female breast: Secondary | ICD-10-CM | POA: Diagnosis present

## 2018-08-29 ENCOUNTER — Ambulatory Visit
Admission: RE | Admit: 2018-08-29 | Discharge: 2018-08-29 | Disposition: A | Discharge status: Home / Self Care | Payer: BC Managed Care – PPO | Source: Ambulatory Visit | Attending: Radiation Oncology | Admitting: Radiation Oncology

## 2018-08-29 DIAGNOSIS — C50411 Malignant neoplasm of upper-outer quadrant of right female breast: Secondary | ICD-10-CM | POA: Diagnosis not present

## 2018-08-30 ENCOUNTER — Ambulatory Visit
Admission: RE | Admit: 2018-08-30 | Discharge: 2018-08-30 | Disposition: A | Discharge status: Home / Self Care | Payer: BC Managed Care – PPO | Source: Ambulatory Visit | Attending: Radiation Oncology | Admitting: Radiation Oncology

## 2018-08-30 DIAGNOSIS — C50411 Malignant neoplasm of upper-outer quadrant of right female breast: Secondary | ICD-10-CM | POA: Diagnosis not present

## 2018-08-31 ENCOUNTER — Ambulatory Visit
Admission: RE | Admit: 2018-08-31 | Discharge: 2018-08-31 | Disposition: A | Discharge status: Home / Self Care | Payer: BC Managed Care – PPO | Source: Ambulatory Visit | Attending: Radiation Oncology | Admitting: Radiation Oncology

## 2018-08-31 DIAGNOSIS — C50411 Malignant neoplasm of upper-outer quadrant of right female breast: Secondary | ICD-10-CM | POA: Diagnosis not present

## 2018-09-01 ENCOUNTER — Ambulatory Visit
Admission: RE | Admit: 2018-09-01 | Discharge: 2018-09-01 | Disposition: A | Discharge status: Home / Self Care | Payer: BC Managed Care – PPO | Source: Ambulatory Visit | Attending: Radiation Oncology | Admitting: Radiation Oncology

## 2018-09-01 DIAGNOSIS — C50411 Malignant neoplasm of upper-outer quadrant of right female breast: Secondary | ICD-10-CM | POA: Diagnosis not present

## 2018-09-04 ENCOUNTER — Ambulatory Visit
Admission: RE | Admit: 2018-09-04 | Discharge: 2018-09-04 | Disposition: A | Discharge status: Home / Self Care | Payer: BC Managed Care – PPO | Source: Ambulatory Visit | Attending: Radiation Oncology | Admitting: Radiation Oncology

## 2018-09-04 ENCOUNTER — Telehealth: Payer: Self-pay | Admitting: *Deleted

## 2018-09-04 ENCOUNTER — Inpatient Hospital Stay: Payer: BC Managed Care – PPO

## 2018-09-04 ENCOUNTER — Inpatient Hospital Stay (HOSPITAL_BASED_OUTPATIENT_CLINIC_OR_DEPARTMENT_OTHER): Payer: BC Managed Care – PPO | Admitting: Medical

## 2018-09-04 ENCOUNTER — Other Ambulatory Visit: Payer: Self-pay | Admitting: Medical

## 2018-09-04 ENCOUNTER — Inpatient Hospital Stay: Payer: BC Managed Care – PPO | Attending: Oncology

## 2018-09-04 VITALS — BP 112/77 | HR 75 | Temp 98.5°F | Resp 18

## 2018-09-04 DIAGNOSIS — C50912 Malignant neoplasm of unspecified site of left female breast: Secondary | ICD-10-CM | POA: Insufficient documentation

## 2018-09-04 DIAGNOSIS — Z170 Estrogen receptor positive status [ER+]: Secondary | ICD-10-CM | POA: Diagnosis not present

## 2018-09-04 DIAGNOSIS — Z95828 Presence of other vascular implants and grafts: Secondary | ICD-10-CM

## 2018-09-04 DIAGNOSIS — C50411 Malignant neoplasm of upper-outer quadrant of right female breast: Secondary | ICD-10-CM

## 2018-09-04 DIAGNOSIS — Z5112 Encounter for antineoplastic immunotherapy: Secondary | ICD-10-CM | POA: Diagnosis not present

## 2018-09-04 DIAGNOSIS — C773 Secondary and unspecified malignant neoplasm of axilla and upper limb lymph nodes: Secondary | ICD-10-CM

## 2018-09-04 DIAGNOSIS — L810 Postinflammatory hyperpigmentation: Secondary | ICD-10-CM

## 2018-09-04 DIAGNOSIS — T300 Burn of unspecified body region, unspecified degree: Secondary | ICD-10-CM

## 2018-09-04 DIAGNOSIS — C50911 Malignant neoplasm of unspecified site of right female breast: Secondary | ICD-10-CM

## 2018-09-04 LAB — CMP (CANCER CENTER ONLY)
ALT: 19 U/L (ref 0–44)
AST: 17 U/L (ref 15–41)
Albumin: 3.5 g/dL (ref 3.5–5.0)
Alkaline Phosphatase: 178 U/L — ABNORMAL HIGH (ref 38–126)
Anion gap: 10 (ref 5–15)
BUN: 10 mg/dL (ref 6–20)
CO2: 26 mmol/L (ref 22–32)
Calcium: 8.9 mg/dL (ref 8.9–10.3)
Chloride: 99 mmol/L (ref 98–111)
Creatinine: 0.88 mg/dL (ref 0.44–1.00)
GFR, Est AFR Am: >60 mL/min (ref >60)
GFR, Estimated: >60 mL/min (ref >60)
Glucose, Bld: 249 mg/dL — ABNORMAL HIGH (ref 70–99)
Potassium: 3.7 mmol/L (ref 3.5–5.1)
Sodium: 135 mmol/L (ref 135–145)
Total Bilirubin: 0.4 mg/dL (ref 0.3–1.2)
Total Protein: 7.2 g/dL (ref 6.5–8.1)

## 2018-09-04 LAB — CBC WITH DIFFERENTIAL (CANCER CENTER ONLY)
Abs Immature Granulocytes: 0.01 10*3/uL (ref 0.00–0.07)
Basophils Absolute: 0 10*3/uL (ref 0.0–0.1)
Basophils Relative: 0 %
Eosinophils Absolute: 0 10*3/uL (ref 0.0–0.5)
Eosinophils Relative: 1 %
HCT: 33.8 % — ABNORMAL LOW (ref 36.0–46.0)
Hemoglobin: 10.8 g/dL — ABNORMAL LOW (ref 12.0–15.0)
Immature Granulocytes: 1 %
Lymphocytes Relative: 31 %
Lymphs Abs: 0.6 10*3/uL — ABNORMAL LOW (ref 0.7–4.0)
MCH: 29.5 pg (ref 26.0–34.0)
MCHC: 32 g/dL (ref 30.0–36.0)
MCV: 92.3 fL (ref 80.0–100.0)
Monocytes Absolute: 0.2 10*3/uL (ref 0.1–1.0)
Monocytes Relative: 12 %
Neutro Abs: 1 10*3/uL — ABNORMAL LOW (ref 1.7–7.7)
Neutrophils Relative %: 55 %
Platelet Count: 168 10*3/uL (ref 150–400)
RBC: 3.66 MIL/uL — ABNORMAL LOW (ref 3.87–5.11)
RDW: 13.8 % (ref 11.5–15.5)
WBC Count: 1.9 10*3/uL — ABNORMAL LOW (ref 4.0–10.5)
nRBC: 0 % (ref 0.0–0.2)

## 2018-09-04 MED ORDER — HEPARIN SOD (PORK) LOCK FLUSH 100 UNIT/ML IV SOLN
500.0000 [IU] | Freq: Once | INTRAVENOUS | Status: AC | PRN
  Administered 2018-09-04: 500 [IU]
  Filled 2018-09-04: qty 5

## 2018-09-04 MED ORDER — SILVER SULFADIAZINE 1 % EX CREA: 1.0000 "application " | TOPICAL_CREAM | Freq: Two times a day (BID) | CUTANEOUS | 1 refills | Status: DC

## 2018-09-04 MED ORDER — DIPHENHYDRAMINE HCL 25 MG PO CAPS
ORAL_CAPSULE | ORAL | Status: AC
  Filled 2018-09-04: qty 1

## 2018-09-04 MED ORDER — DIPHENHYDRAMINE HCL 25 MG PO CAPS
25.0000 mg | ORAL_CAPSULE | Freq: Once | ORAL | Status: AC
  Administered 2018-09-04: 25 mg via ORAL

## 2018-09-04 MED ORDER — ACETAMINOPHEN 325 MG PO TABS
650.0000 mg | ORAL_TABLET | Freq: Once | ORAL | Status: AC
  Administered 2018-09-04: 650 mg via ORAL

## 2018-09-04 MED ORDER — SODIUM CHLORIDE 0.9 % IV SOLN
Freq: Once | INTRAVENOUS | Status: AC
  Administered 2018-09-04: 12:00:00 via INTRAVENOUS
  Filled 2018-09-04: qty 250

## 2018-09-04 MED ORDER — SODIUM CHLORIDE 0.9% FLUSH
10.0000 mL | INTRAVENOUS | Status: DC | PRN
  Administered 2018-09-04: 10 mL
  Filled 2018-09-04: qty 10

## 2018-09-04 MED ORDER — SODIUM CHLORIDE 0.9% FLUSH
10.0000 mL | Freq: Once | INTRAVENOUS | Status: AC
  Administered 2018-09-04: 10 mL
  Filled 2018-09-04: qty 10

## 2018-09-04 MED ORDER — TRASTUZUMAB CHEMO 150 MG IV SOLR
6.0000 mg/kg | Freq: Once | INTRAVENOUS | Status: AC
  Administered 2018-09-04: 714 mg via INTRAVENOUS
  Filled 2018-09-04: qty 34

## 2018-09-04 MED ORDER — ACETAMINOPHEN 325 MG PO TABS
ORAL_TABLET | ORAL | Status: AC
  Filled 2018-09-04: qty 2

## 2018-09-04 MED FILL — SSD 1% CREAM: 1 | 30 days supply | Qty: 400 | Fill #0

## 2018-09-04 NOTE — Progress Notes (Unsigned)
Per Dr. Jana Hakim, patient is OK to treat with today's labs.

## 2018-09-04 NOTE — Progress Notes (Signed)
The patient is a 48 year old female who is managed by Dr. Jana Hakim.  She was seen in the infusion room today when she reported that she was having pain and skin breakdown in her right axilla at the site of radiation therapy.  The patient's right axilla was examined.  She had hyperpigmentation and erythema.  There was a small area where the patient indicated that there was skin breakdown.  No open lesions were noted.  There was no exudate.  The patient was given a prescription for Silvadene cream to use twice daily.  She was told to store this in the refrigerator as it would augment her relief of symptoms.  Sandi Mealy, MHS, PA-C Physician Assistant

## 2018-09-04 NOTE — Telephone Encounter (Signed)
Per MD review of anc 1.0 - ok to proceed with herceptin today.

## 2018-09-04 NOTE — Patient Instructions (Signed)
[facility] Discharge Instructions for Patients Receiving Chemotherapy Today you received the following chemotherapy agents:  Herceptin To help prevent nausea and vomiting after your treatment, we encourage you to take your nausea medication as prescribed.   If you develop nausea and vomiting that is not controlled by your nausea medication, call the clinic.   BELOW ARE SYMPTOMS THAT SHOULD BE REPORTED IMMEDIATELY:  *FEVER GREATER THAN 100.5 F  *CHILLS WITH OR WITHOUT FEVER  NAUSEA AND VOMITING THAT IS NOT CONTROLLED WITH YOUR NAUSEA MEDICATION  *UNUSUAL SHORTNESS OF BREATH  *UNUSUAL BRUISING OR BLEEDING  TENDERNESS IN MOUTH AND THROAT WITH OR WITHOUT PRESENCE OF ULCERS  *URINARY PROBLEMS  *BOWEL PROBLEMS  UNUSUAL RASH Items with * indicate a potential emergency and should be followed up as soon as possible.  Feel free to call the clinic should you have any questions or concerns. The clinic phone number is [phone].  Please show the CHEMO ALERT CARD at check-in to the Emergency Department and triage nurse.   

## 2018-09-05 ENCOUNTER — Ambulatory Visit
Admission: RE | Admit: 2018-09-05 | Discharge: 2018-09-05 | Disposition: A | Discharge status: Home / Self Care | Payer: BC Managed Care – PPO | Source: Ambulatory Visit | Attending: Radiation Oncology | Admitting: Radiation Oncology

## 2018-09-05 DIAGNOSIS — C50411 Malignant neoplasm of upper-outer quadrant of right female breast: Secondary | ICD-10-CM | POA: Diagnosis present

## 2018-09-05 DIAGNOSIS — Z170 Estrogen receptor positive status [ER+]: Secondary | ICD-10-CM | POA: Diagnosis not present

## 2018-09-05 DIAGNOSIS — Z510 Encounter for antineoplastic radiation therapy: Secondary | ICD-10-CM | POA: Diagnosis not present

## 2018-09-06 ENCOUNTER — Ambulatory Visit
Admission: RE | Admit: 2018-09-06 | Discharge: 2018-09-06 | Disposition: A | Discharge status: Home / Self Care | Payer: BC Managed Care – PPO | Source: Ambulatory Visit | Attending: Radiation Oncology | Admitting: Radiation Oncology

## 2018-09-06 ENCOUNTER — Encounter: Payer: Self-pay | Admitting: Radiation Oncology

## 2018-09-06 DIAGNOSIS — C50411 Malignant neoplasm of upper-outer quadrant of right female breast: Secondary | ICD-10-CM | POA: Diagnosis not present

## 2018-09-07 ENCOUNTER — Other Ambulatory Visit: Payer: Self-pay | Admitting: Oncology

## 2018-09-07 DIAGNOSIS — C773 Secondary and unspecified malignant neoplasm of axilla and upper limb lymph nodes: Principal | ICD-10-CM

## 2018-09-07 DIAGNOSIS — C50911 Malignant neoplasm of unspecified site of right female breast: Secondary | ICD-10-CM

## 2018-09-11 NOTE — Progress Notes (Signed)
Disability for February and March 2020 successfully faxed to Doristine Johns at [phone]. Mailed copy to patient address on file.

## 2018-09-12 NOTE — Progress Notes (Signed)
  Radiation Oncology         [phone]) [phone] ________________________________  Name: Kelly Rowe MRN: [id number]  Date: 09/06/2018  DOB: Jun 19, 1971  End of Treatment Note  Diagnosis:   47 y.o. female with Malignant neoplasm of upper-outer quadrant of right breast in female, estrogen receptor positive ([place]) Staging form: Breast, AJCC 8th Edition - Clinical stage from 01/18/2018: Stage IIIB (cT1c, cN3b, cM0, G3, ER+, PR+, HER2+) - Signed by [name], MD on 07/18/2018 - Pathologic: Stage IB (pT2, pN1a, cM0, G3, ER+, PR+, HER2+) - Signed by Gardenia Phlegm, NP on 03/08/2018     Indication for treatment:  Curative       Radiation treatment dates:   07/27/2018 - 09/06/2018  Site/dose:    1. Right Breast and IM nodes / 50 Gy in 25 fractions 2. Right Axillary and Supraclavicular nodes / 50 Gy in 25 fractions 3. Right Breast Boost / 10 Gy in 5 fractions  Beams/energy:    1. 3D / 15X Photon 2. 3D / 15X Photon 3. 10X, 6X, 15X photons  Narrative: The patient tolerated radiation treatment relatively well.   She did experience some skin irritation and axillary pain as she progressed through treatment. Her right breast shows hyperpigmentation, and moist desquamation is present at the axillary scar. She is applying Neosporin to this area and continues to use Radiaplex to other areas of her breast. She denied any fatigue.  Plan: The patient has completed radiation treatment. The patient will followup in one month. I advised them to call or return sooner if they have any questions or concerns related to their recovery or treatment.  -----------------------------------  [name], MD  This document serves as a record of services personally performed by [name], MD. It was created on her behalf by Rae Lips, a trained medical scribe. The creation of this record is based on the scribe's personal observations and the provider's statements to them. This document has been  checked and approved by the attending provider.

## 2018-09-13 ENCOUNTER — Telehealth: Payer: Self-pay | Admitting: General Practice

## 2018-09-13 ENCOUNTER — Encounter: Payer: Self-pay | Admitting: General Practice

## 2018-09-13 ENCOUNTER — Other Ambulatory Visit: Payer: Self-pay | Admitting: Women's Health

## 2018-09-13 DIAGNOSIS — R300 Dysuria: Secondary | ICD-10-CM

## 2018-09-13 NOTE — Telephone Encounter (Signed)
[facility] CSW Progress Notes   

## 2018-09-13 NOTE — Progress Notes (Signed)
[facility] CSW Progress Notes  Referred to Building services engineer for emergency assistance via [place] [employer] platform, agencies will reach out directly to patient.  Edwyna Shell, LCSW Clinical Social Worker Phone:  [phone]

## 2018-09-13 NOTE — Telephone Encounter (Signed)
[place] CSW Progress Notes  Call fro patient, concerned about finances.  Is on short term disability from one job, no income from second part time job.  Works as bus Geophysicist/field seismologist, not able to return to work until June 2020.  Stressed about paying bills.  Has received maximum [employer].  Discussed [facility] [employer] in [place], [place].  Will mail applications for these options.  Will meet w patient to complete these applications on Thurs 3/74 at 11 AM.  Will also refer patient to [facility] for emergency financial assistance, referral letter generated.  Edwyna Shell, LCSW Clinical Social Worker Phone:  [phone]

## 2018-09-13 NOTE — Telephone Encounter (Signed)
Left message for patient to call.  On here it says that the Diflucan is for dysuria?  History of diabetes and recurrent yeast, also currently being treated for breast cancer now.  If she is having vaginal itching okay for Diflucan  100 mg 1 tablet daily for 3 days office visit if no relief.

## 2018-09-14 NOTE — Telephone Encounter (Signed)
Patient called back stating she is having vaginal itching only, no dysuria Rx sent for Diflucan 100mg  daily x 3 days. Marland Kitchen

## 2018-09-21 ENCOUNTER — Encounter: Payer: Self-pay | Admitting: General Practice

## 2018-09-21 ENCOUNTER — Inpatient Hospital Stay: Payer: BC Managed Care – PPO | Admitting: General Practice

## 2018-09-21 NOTE — Progress Notes (Signed)
[facility] CSW Progress Notes  Met w patient to help w financial distress.  Discussed available resources including [facility] in [place], [facility] and [place].  Applications provided for all the above - patient will gather required documentation and work w CSW to submit.  Next [facility] appointment is Monday March 2 - CSW Dalene Seltzer may be able to help patient submit any completed applications at that time.  Edwyna Shell, LCSW Clinical Social Worker Phone:  [phone]

## 2018-09-24 NOTE — Progress Notes (Signed)
[facility]  [phone] [phone]    ID: Kelly Rowe DOB: 12/15/1970  MR#: [id number]  WYO#:[id number]  Patient Care Team: Biagio Borg, MD as PCP - [name], MD as Consulting Physician (General Surgery) [name], [name], MD as Consulting Physician (Oncology) [name], MD as Attending Physician (Radiation Oncology) [name], NP as Nurse Practitioner (Obstetrics and Gynecology) Marlinda Mike, PA-C as Referring Physician (Physician Assistant) OTHER MD:   CHIEF COMPLAINT: Triple positive breast cancer  CURRENT TREATMENT: Trastuzumab, tamoxifen  HISTORY OF CURRENT ILLNESS: From the original intake note:  "Kelly Rowe" noticed bruising in her lateral right breast and palpated a mass  on 12/23/2017. She followed up with her gynecologist. She underwent unilateral right diagnostic mammography with tomography and right breast ultrasonography at [facility] on 01/05/2018 showing: breast density category B. There is an irregular highly suspicious mass within the right breast at the 9:30 o'clock upper outer quadrant, measuring 1.8 x 1.1 x 1.5 cm, and located 18 cm from the nipple,  Ultrasonography revealed a single morphologically abnormal lymph node in the RIGHT axilla, with cortical thickness of 6 mm.   Accordingly on 01/11/2018 she proceeded to biopsy of the right breast area in question as well as a suspicious lymph node. The pathology from this procedure showed ([id number]): Invasive ductal carcinoma grade III.  The lymph node biopsied was negative for carcinoma (concordant).. Prognostic indicators significant for: estrogen receptor, 60% positive with weak staining intensity and progesterone receptor, 10% positive with strong staining intensity. Proliferation marker Ki67 at 30%. HER2 amplified with ratios HER2/CEP17 signals 2.26 and average HER2 copies per cell 3.50  The patient's subsequent history is as detailed  below.   INTERVAL HISTORY: Kelly Rowe returns today for follow-up and treatment of her triple positive breast cancer.   She continues on trastuzumab. She has hot flashes. She still has nausea following treatment. She has found if she stays away from fried fatty foods that it helps control this.   Kelly Rowe's last echocardiogram on 06/19/2018, showed an ejection fraction in the 55% - 60% range.   Since her last visit here, she completed adjuvant radiation 07/27/2018 - 09/06/2018  Site/dose: 1. Right Breast / 50 Gy in 25 fractions    2. Right Supraclavicular nodes / 50 Gy in 25 fractions    3. Right Breast Boost / 10 Gy in 5 fractions   REVIEW OF SYSTEMS: Chosen said the ending of radiation was awful; she is extremely fatigued and she had some sesquamation. For exercise, she tried to walk a little bit. She notes that she is still sore. She has not gotten to the gym, but she still does her own cleaning and shopping. She says she is depressed; She is worried about work, taking care of her son with a medical condition, and treatment. The patient denies unusual headaches, visual changes, nausea, vomiting, or dizziness. There has been no unusual cough, phlegm production, or pleurisy. This been no change in bowel or bladder habits. The patient denies unexplained fatigue or unexplained weight loss, bleeding, rash, or fever. A detailed review of systems was otherwise noncontributory.    PAST MEDICAL HISTORY: Past Medical History:  Diagnosis Date  . ANEMIA-IRON DEFICIENCY 01/30/2010  . ANXIETY 11/27/2007  . Arthritis   . Asthma 02/25/2011  . Cancer ([place])    breast  . Carbuncle and furuncle of trunk 04/16/2010  . Chlamydia infection 03/21/2008  . DIABETES MELLITUS, TYPE II 08/02/2007  . Edema 01/30/2010  . ELEVATED  BLOOD PRESSURE WITHOUT DIAGNOSIS OF HYPERTENSION 08/02/2007  . Family history of breast cancer   . Family history of colon cancer   . Family history of lung cancer   . FREQUENCY, URINARY  05/19/2009  . GENITAL HERPES 03/12/2009  . GERD (gastroesophageal reflux disease)   . History of kidney stones   . HIV (human immunodeficiency virus infection) ([place]) 2009  . HIV INFECTION 03/12/2009  . HSV (herpes simplex virus) infection   . HYPERLIPIDEMIA 08/02/2007  . Hypertension   . Metrorrhagia 03/21/2008  . PONV (postoperative nausea and vomiting)    woke up crying per patient   . RETENTION, URINE 05/19/2009  . Trichomonas infection 01/19/2010  . VITAMIN D DEFICIENCY 01/30/2010    PAST SURGICAL HISTORY: Past Surgical History:  Procedure Laterality Date  . ABDOMINAL HYSTERECTOMY  07/22/2010   TAH WITH PRESERVATION OF BOTH TUBES AND OVARIES  . BREAST LUMPECTOMY WITH RADIOACTIVE SEED AND SENTINEL LYMPH NODE BIOPSY Right 02/23/2018   Procedure: BREAST LUMPECTOMY WITH RADIOACTIVE SEED AND SENTINEL LYMPH NODE BIOPSY;  Surgeon: Stark Klein, MD;  Location: [facility];  Service: General;  Laterality: Right;  . CHOLECYSTECTOMY    . ENDOMETRIAL ABLATION  01/11/2008   HER OPTION  . ESOPHAGOGASTRODUODENOSCOPY    . MULTIPLE TOOTH EXTRACTIONS    . PORTACATH PLACEMENT Left 02/23/2018   Procedure: INSERTION PORT-A-CATH;  Surgeon: Stark Klein, MD;  Location: [facility];  Service: General;  Laterality: Left;  . PORTACATH PLACEMENT N/A 04/21/2018   Procedure: PORT-A-CATH REVISION;  Surgeon: Stark Klein, MD;  Location: [facility];  Service: General;  Laterality: N/A;  . TUBAL LIGATION    . WISDOM TOOTH EXTRACTION      FAMILY HISTORY: Family History  Problem Relation Age of Onset  . Prostate cancer Other   . Heart disease Other   . Stroke Other   . Diabetes Mother   . Hypertension Mother   . Diabetes Father   . Cancer Father        COLON and LU NG  . Stroke Paternal Uncle   . Breast cancer Paternal Aunt   . Lung cancer Maternal Grandmother 59       Mesothelioma  . Colon cancer Paternal Grandfather        dx over 33s  . Lung cancer Paternal Aunt   . Breast cancer Paternal Aunt   . Breast cancer  Paternal Aunt   . Heart attack Maternal Grandfather 71  . Colon cancer Other        MGM's 5 brothers   The patient's father died at age 35 due to metastatic liver cancer. The patient's mother is alive at 61. The patient's has 1 brother and 3 sisters. There was a paternal grandfather with colon cancer. There were 3 paternal aunts with breast cancer, 2 diagnosed in the 55's and 1 at age 42. There was a maternal grandmother with mesothelioma. The patient otherwise denies a history of ovarian cancer in the family.    GYNECOLOGIC HISTORY:  Patient's last menstrual period was 06/21/2010. Menarche: 48 years old Age at first live birth: 48 years old She is GXP5. Her LMP was December 2011. She is status post partial hysterectomy without oophorectomy. She took oral contraception for 3 years with no complications. She never took HRT.    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  At home are 2 of her sons, Glendell Docker and [name], and the patient's grandson, Amador Cunas 67 (who is Willie's son). The patient's oldest is  Izell Warrenton age 46 who lives in [place] as a cook. Daughter, [place] age 87 works as a Scientist, water quality. Son, Glendell Docker age 36 is disabled. Son, [name] age 69 lives in [place] as a Microbiologist. Daughter, Benard Rink age 26 also is a Microbiologist. The patient has 5 grandchildren and no great grandchildren. The patient does not belong to a church.    ADVANCED DIRECTIVES: Not in place; at the 01/18/2018 visit the patient was given the appropriate documents to complete on notarized at her discretion   HEALTH MAINTENANCE: Social History   Tobacco Use  . Smoking status: Never Smoker  . Smokeless tobacco: Never Used  Substance Use Topics  . Alcohol use: Never    Alcohol/week: 0.0 standard drinks    Frequency: Never  . Drug use: No     Colonoscopy: Not yet  PAP: November 2018  Bone density: Never   Allergies  Allergen Reactions  . Levaquin [Levofloxacin In D5w] Nausea And Vomiting and Rash  .  Penicillins Swelling and Rash    Has patient had a PCN reaction causing immediate rash, facial/tongue/throat swelling, SOB or lightheadedness with hypotension: Yes Has patient had a PCN reaction causing severe rash involving mucus membranes or skin necrosis: No Has patient had a PCN reaction that required hospitalization: Yes Has patient had a PCN reaction occurring within the last 10 years: No If all of the above answers are "NO", then may proceed with Cephalosporin use.    Current Outpatient Medications  Medication Sig Dispense Refill  . albuterol (PROVENTIL HFA;VENTOLIN HFA) 108 (90 Base) MCG/ACT inhaler Inhale 2 puffs into the lungs every 6 (six) hours as needed for wheezing or shortness of breath.    Marland Kitchen aspirin 81 MG tablet Take 81 mg by mouth daily as needed (chest pain).     . Azelastine-Fluticasone (DYMISTA) 137-50 MCG/ACT SUSP Use as directed 1 spray each side twice per day as needed 23 g 5  . Beclomethasone Dipropionate (QNASL) 80 MCG/ACT AERS Place 2 sprays into the nose daily. 1 Inhaler 1  . bictegravir-emtricitabine-tenofovir AF (BIKTARVY) 50-200-25 MG TABS tablet Take 1 tablet by mouth daily.    . blood glucose meter kit and supplies Dispense based on patient and insurance preference. Use up to four times daily as directed. (FOR ICD-10 E10.9, E11.9). 1 each 0  . Diclofenac Sodium (PENNSAID) 2 % SOLN Place 1 application onto the skin 2 (two) times daily. (Patient taking differently: Place 1 application onto the skin 2 (two) times daily as needed. ) 1 Bottle 3  . Dulaglutide (TRULICITY) 1.5 VC/9.4WH SOPN 0.5 ml weekly SQ 6 mL 3  . famotidine (PEPCID) 20 MG tablet Take 1 tablet (20 mg total) by mouth 2 (two) times daily. (Patient taking differently: Take 20 mg by mouth daily as needed for heartburn. ) 60 tablet 3  . ferrous gluconate (IRON 27) 240 (27 FE) MG tablet Take 240 mg by mouth daily as needed (iron).     . fluconazole (DIFLUCAN) 100 MG tablet TAKE 1 TABLET BY MOUTH ONCE DAILY 3  tablet 0  . glipiZIDE (GLIPIZIDE XL) 5 MG 24 hr tablet Take 1 tablet (5 mg total) by mouth daily with breakfast. 90 tablet 3  . ibuprofen (ADVIL,MOTRIN) 800 MG tablet Take 1 tablet (800 mg total) by mouth every 8 (eight) hours as needed. (Patient taking differently: Take 800 mg by mouth daily as needed for moderate pain. ) 60 tablet 0  . lidocaine-prilocaine (EMLA) cream Apply to affected area once (Patient taking differently: Apply 1 application  topically once. Apply to affected area once) 30 g 3  . loratadine (CLARITIN) 10 MG tablet Take 10 mg by mouth daily.    Marland Kitchen LORazepam (ATIVAN) 0.5 MG tablet Take 1 tablet (0.5 mg total) by mouth at bedtime as needed (Nausea or vomiting). 30 tablet 0  . metFORMIN (GLUCOPHAGE-XR) 500 MG 24 hr tablet Take 2 tablets (1,000 mg total) by mouth daily with breakfast. Annual appt is due must see provider for future refills 180 tablet 3  . Multiple Vitamin (MULTIVITAMIN WITH MINERALS) TABS tablet Take 1 tablet by mouth daily.    Marland Kitchen omeprazole (PRILOSEC) 40 MG capsule Take 1 capsule (40 mg total) by mouth daily. 30 capsule 0  . ondansetron (ZOFRAN) 8 MG tablet Take 1 tablet (8 mg total) by mouth every 8 (eight) hours as needed for nausea or vomiting. 20 tablet 0  . oxyCODONE (OXY IR/ROXICODONE) 5 MG immediate release tablet Take 1-2 tablets (5-10 mg total) by mouth every 6 (six) hours as needed for moderate pain, severe pain or breakthrough pain. (Patient not taking: Reported on 07/11/2018) 30 tablet 0  . prochlorperazine (COMPAZINE) 10 MG tablet Take 1 tablet (10 mg total) by mouth every 6 (six) hours as needed (Nausea or vomiting). 30 tablet 1  . silver sulfADIAZINE (SILVADENE) 1 % cream Apply 1 application topically 2 (two) times daily. 400 g 1  . tamoxifen (NOLVADEX) 20 MG tablet Take 1 tablet (20 mg total) by mouth daily for 30 days. 90 tablet 12  . valACYclovir (VALTREX) 500 MG tablet Take twice daily for 3-5 days 30 tablet 12  . venlafaxine XR (EFFEXOR-XR) 37.5 MG  24 hr capsule Take 1 capsule (37.5 mg total) by mouth daily with breakfast. 90 capsule 4  . zolpidem (AMBIEN) 10 MG tablet Take 1 tablet (10 mg total) by mouth at bedtime as needed for sleep. 90 tablet 1   No current facility-administered medications for this visit.    Facility-Administered Medications Ordered in Other Visits  Medication Dose Route Frequency Provider Last Rate Last Dose  . 0.9 %  sodium chloride infusion   Intravenous Once [name], [name], MD      . sodium chloride 0.9 % 1,000 mL with potassium chloride 10 mEq infusion   Intravenous Once [name], [name], MD      . sodium chloride flush (NS) 0.9 % injection 10 mL  10 mL Intracatheter PRN [name], [name], MD   10 mL at 09/04/18 1425    OBJECTIVE: Morbidly obese African-American woman who appears stated age  48:   09/25/18 1108  BP: 131/69  Pulse: 65  Resp: 18  Temp: 98.6 F (37 C)  SpO2: 98%     Body mass index is 45.98 kg/m.   Wt Readings from Last 3 Encounters:  09/25/18 251 lb 6.4 oz (114 kg)  07/24/18 256 lb 3.2 oz (116.2 kg)  07/11/18 249 lb (112.9 kg)  ECOG FS: 2 - Symptomatic, <50% confined to bed  The hair is coming back in nicely peripherally but there is a central area on the front on top of concern Sclerae unicteric, pupils round and equal No cervical or supraclavicular adenopathy Lungs no rales or rhonchi Heart regular rate and rhythm Abd soft, nontender, positive bowel sounds MSK no focal spinal tenderness, no upper extremity lymphedema Neuro: nonfocal, well oriented, appropriate affect Breasts: The right breast is status post lumpectomy and radiation.  There is wet desquamation in the right axilla and dry desquamation in the inframammary fold.  There  is some coarsening of the skin as expected.  There is hyperpigmentation.  The left breast is benign.  Both axillae are benign.    LAB RESULTS:  CMP     Component Value Date/Time   NA 135 09/25/2018 1041   K 3.6 09/25/2018 1041    CL 96 (L) 09/25/2018 1041   CO2 28 09/25/2018 1041   GLUCOSE 415 (H) 09/25/2018 1041   BUN 11 09/25/2018 1041   CREATININE 0.94 09/25/2018 1041   CALCIUM 8.8 (L) 09/25/2018 1041   PROT 7.4 09/25/2018 1041   ALBUMIN 3.5 09/25/2018 1041   AST 20 09/25/2018 1041   ALT 24 09/25/2018 1041   ALKPHOS 199 (H) 09/25/2018 1041   BILITOT 0.3 09/25/2018 1041   GFRNONAA >60 09/25/2018 1041   GFRAA >60 09/25/2018 1041    No results found for: TOTALPROTELP, ALBUMINELP, A1GS, A2GS, BETS, BETA2SER, GAMS, MSPIKE, SPEI  No results found for: KPAFRELGTCHN, LAMBDASER, KAPLAMBRATIO  Lab Results  Component Value Date   WBC 1.9 (L) 09/25/2018   NEUTROABS PENDING 09/25/2018   HGB 11.2 (L) 09/25/2018   HCT 35.0 (L) 09/25/2018   MCV 90.2 09/25/2018   PLT 175 09/25/2018    @LASTCHEMISTRY @  No results found for: LABCA2  No components found for: VEHMCN470  No results for input(s): INR in the last 168 hours.  No results found for: LABCA2  No results found for: JGG836  No results found for: OQH476  No results found for: LYY503  No results found for: CA2729  No components found for: HGQUANT  No results found for: CEA1 / No results found for: CEA1   No results found for: AFPTUMOR  No results found for: CHROMOGRNA  No results found for: PSA1  Appointment on 09/25/2018  Component Date Value Ref Range Status  . Sodium 09/25/2018 135  135 - 145 mmol/L Final  . Potassium 09/25/2018 3.6  3.5 - 5.1 mmol/L Final  . Chloride 09/25/2018 96* 98 - 111 mmol/L Final  . CO2 09/25/2018 28  22 - 32 mmol/L Final  . Glucose, Bld 09/25/2018 415* 70 - 99 mg/dL Final  . BUN 09/25/2018 11  6 - 20 mg/dL Final  . Creatinine 09/25/2018 0.94  0.44 - 1.00 mg/dL Final  . Calcium 09/25/2018 8.8* 8.9 - 10.3 mg/dL Final  . Total Protein 09/25/2018 7.4  6.5 - 8.1 g/dL Final  . Albumin 09/25/2018 3.5  3.5 - 5.0 g/dL Final  . AST 09/25/2018 20  15 - 41 U/L Final  . ALT 09/25/2018 24  0 - 44 U/L Final  . Alkaline  Phosphatase 09/25/2018 199* 38 - 126 U/L Final  . Total Bilirubin 09/25/2018 0.3  0.3 - 1.2 mg/dL Final  . GFR, Est Non Af Am 09/25/2018 >60  >60 mL/min Final  . GFR, Est AFR Am 09/25/2018 >60  >60 mL/min Final  . Anion gap 09/25/2018 11  5 - 15 Final   Performed at [facility], [place] [address], [place], [place] [address]  . WBC Count 09/25/2018 1.9* 4.0 - 10.5 K/uL Final  . RBC 09/25/2018 3.88  3.87 - 5.11 MIL/uL Final  . Hemoglobin 09/25/2018 11.2* 12.0 - 15.0 g/dL Final  . HCT 09/25/2018 35.0* 36.0 - 46.0 % Final  . MCV 09/25/2018 90.2  80.0 - 100.0 fL Final  . MCH 09/25/2018 28.9  26.0 - 34.0 pg Final  . MCHC 09/25/2018 32.0  30.0 - 36.0 g/dL Final  . RDW 09/25/2018 13.5  11.5 - 15.5 % Final  .  Platelet Count 09/25/2018 175  150 - 400 K/uL Final  . nRBC 09/25/2018 0.0  0.0 - 0.2 % Final   Performed at [facility], [place] [address], [place], [place] [address]  . Neutrophils Relative % 09/25/2018 PENDING  % Incomplete  . Neutro Abs 09/25/2018 PENDING  1.7 - 7.7 K/uL Incomplete  . Band Neutrophils 09/25/2018 PENDING  % Incomplete  . Lymphocytes Relative 09/25/2018 PENDING  % Incomplete  . Lymphs Abs 09/25/2018 PENDING  0.7 - 4.0 K/uL Incomplete  . Monocytes Relative 09/25/2018 PENDING  % Incomplete  . Monocytes Absolute 09/25/2018 PENDING  0.1 - 1.0 K/uL Incomplete  . Eosinophils Relative 09/25/2018 PENDING  % Incomplete  . Eosinophils Absolute 09/25/2018 PENDING  0.0 - 0.5 K/uL Incomplete  . Basophils Relative 09/25/2018 PENDING  % Incomplete  . Basophils Absolute 09/25/2018 PENDING  0.0 - 0.1 K/uL Incomplete  . WBC Morphology 09/25/2018 PENDING   Incomplete  . RBC Morphology 09/25/2018 PENDING   Incomplete  . Smear Review 09/25/2018 PENDING   Incomplete  . Other 09/25/2018 PENDING  % Incomplete  . nRBC 09/25/2018 PENDING  0 /100 WBC Incomplete  . Metamyelocytes Relative 09/25/2018 PENDING  % Incomplete  . Myelocytes 09/25/2018  PENDING  % Incomplete  . Promyelocytes Relative 09/25/2018 PENDING  % Incomplete  . Blasts 09/25/2018 PENDING  % Incomplete    (this displays the last labs from the last 3 days)  No results found for: TOTALPROTELP, ALBUMINELP, A1GS, A2GS, BETS, BETA2SER, GAMS, MSPIKE, SPEI (this displays SPEP labs)  No results found for: KPAFRELGTCHN, LAMBDASER, KAPLAMBRATIO (kappa/lambda light chains)  No results found for: HGBA, HGBA2QUANT, HGBFQUANT, HGBSQUAN (Hemoglobinopathy evaluation)   No results found for: LDH  Lab Results  Component Value Date   IRON 35 (L) 01/30/2010   IRONPCTSAT 8.8 (L) 01/30/2010   (Iron and TIBC)  No results found for: FERRITIN  Urinalysis    Component Value Date/Time   COLORURINE RED (A) 04/01/2018 0202   APPEARANCEUR HAZY (A) 04/01/2018 0202   LABSPEC 1.021 04/01/2018 0202   PHURINE 6.0 04/01/2018 0202   GLUCOSEU >=500 (A) 04/01/2018 0202   GLUCOSEU NEGATIVE 09/27/2017 1002   HGBUR LARGE (A) 04/01/2018 0202   BILIRUBINUR NEGATIVE 04/01/2018 0202   KETONESUR 5 (A) 04/01/2018 0202   PROTEINUR 100 (A) 04/01/2018 0202   UROBILINOGEN 0.2 09/27/2017 1002   NITRITE NEGATIVE 04/01/2018 0202   LEUKOCYTESUR NEGATIVE 04/01/2018 0202     STUDIES: No results found.   ELIGIBLE FOR AVAILABLE RESEARCH PROTOCOL: no   ASSESSMENT: 48 y.o. [place], [name] woman status post right breast upper outer quadrant biopsy 01/11/2018 for a clinical T1 N0, stage IA invasive ductal carcinoma, grade 3, estrogen and progesterone receptor positive, HER-2 amplified, with an MIB-1 of 30%.  (1) status post right lumpectomy and sentinel lymph node sampling 02/23/2018 for a pT2 pN1, stage IB invasive ductal carcinoma, grade 3, with close but negative margins  (2) adjuvant chemotherapy consisting of carboplatin, docetaxel, trastuzumab and Pertuzumab every 21 days x 6 03/08/2018-07/05/2018 (a) pertuzumab stopped after cycle 1 due to diarrhea (b) docetaxel changed to gemcitabine after  cycle 2 due to persistent hyperglycemia (c) Gemcitabine and Carboplatin dose reduced by approximately 10% due to delayed neutropenia and thrombocytopenia  (3) anti-HER-2 immunotherapy started concurrently with chemotherapy (a) baseline echocardiogram on 02/07/2018 showed an ejection fraction in the 55-60% range (b) repeat echo 06/19/2018 showed an ejection fraction of 55-60% (c) continuing trastuzumab through June 2020  (4) adjuvant radiation 07/27/2018 - 09/06/2018  Site/dose: 1.  Right Breast / 50 Gy in 25 fractions    2. Right Supraclavicular nodes / 50 Gy in 25 fractions    3. Right Breast Boost / 10 Gy in 5 fractions  (5) antiestrogens to follow at the completion of local treatment  (6) genetics testing through [facility] Multi-cancer and Breast panel on 01/13/2018 showed: no deleterious mutations. The following genes were evaluated for sequence changes and exonic deletions/duplications: ALK, APC, ATM, AXIN2, BAP1, BARD1, BLM, BMPR1A, BRCA1, BRCA2, BRIP1, CASR, CDC73, CDH1, CDK4, CDKN1B, CDKN1C,CDKN2A (p14ARF), CDKN2A (p16INK4a), CEBPA, CHEK2, CTNNA1, DICER1, DIS3L2, EPCAM*, FH, FLCN, GATA2, GPC3, GREM1*, HRAS, KIT, MAX, MEN1, MET, MLH1, MSH2, MSH3, MSH6, MUTYH, NBN, NF1, NF2, PALB2, PDGFRA, PHOX2B*, PMS2, POLD1,POLE, POT1, PRKAR1A, PTCH1, PTEN, RAD50, RAD51C, RAD51D, RB1, RECQL4, RET, RUNX1, SDHAF2, SDHB, SDHC, SDHD,SMAD4, SMARCA4, SMARCB1, SMARCE1, STK11, SUFU, TERC, TERT, TMEM127, TP53, TSC1, TSC2, VHL, WRN*, WT1.The following genes were evaluated for sequence changes only: EGFR*, HOXB13*, MITF*, NTHL1*, SDHA   PLAN: Karrina has completed local treatment for her breast cancer namely the radiation and surgery.  She has also completed her chemotherapy.  She is continuing on trastuzumab every 21 days.  She is already scheduled for repeat echocardiogram tomorrow.  She will receive trastuzumab through June.  She is now ready to start antiestrogens.  Because of her age and the fact that she  still has ovaries in place we are going to go with tamoxifen.  Today we discussed the possible toxicities side effects and complications of this agent and I have placed the prescription in for her.  Meanwhile she is already having significant hot flashes.  I am starting her on venlafaxine at 37.5 mg daily.  When she returns for her next Herceptin dose she will let us know if that is adequate.  If not we will up it to 75 mg daily  She will see [place] again in 9 weeks and 12 weeks.  Once she is done with trastuzumab, has her port removed, and is demonstrating good tolerance of tamoxifen we will broaden the follow-up interval  She knows to call for any other issue that may develop before the next visit.  [name], [name], MD  09/25/18 11:42 AM Medical Oncology and Hematology [facility] [address] [place], [place] [address] Tel. [phone]    Fax. [phone]  I, Jacqualyn Posey am acting as a Education administrator for Chauncey Cruel, MD.   I, Lurline Del MD, have reviewed the above documentation for accuracy and completeness, and I agree with the above.

## 2018-09-25 ENCOUNTER — Inpatient Hospital Stay: Payer: BC Managed Care – PPO | Attending: Oncology

## 2018-09-25 ENCOUNTER — Inpatient Hospital Stay: Payer: BC Managed Care – PPO

## 2018-09-25 ENCOUNTER — Other Ambulatory Visit (HOSPITAL_COMMUNITY): Payer: Self-pay | Admitting: Pharmacist

## 2018-09-25 ENCOUNTER — Inpatient Hospital Stay (HOSPITAL_BASED_OUTPATIENT_CLINIC_OR_DEPARTMENT_OTHER): Payer: BC Managed Care – PPO | Admitting: Oncology

## 2018-09-25 VITALS — BP 131/69 | HR 65 | Temp 98.6°F | Resp 18 | Ht 62.0 in | Wt 251.4 lb

## 2018-09-25 DIAGNOSIS — Z170 Estrogen receptor positive status [ER+]: Secondary | ICD-10-CM

## 2018-09-25 DIAGNOSIS — C50911 Malignant neoplasm of unspecified site of right female breast: Secondary | ICD-10-CM

## 2018-09-25 DIAGNOSIS — C773 Secondary and unspecified malignant neoplasm of axilla and upper limb lymph nodes: Secondary | ICD-10-CM

## 2018-09-25 DIAGNOSIS — Z79810 Long term (current) use of selective estrogen receptor modulators (SERMs): Secondary | ICD-10-CM | POA: Insufficient documentation

## 2018-09-25 DIAGNOSIS — C50411 Malignant neoplasm of upper-outer quadrant of right female breast: Secondary | ICD-10-CM

## 2018-09-25 DIAGNOSIS — Z923 Personal history of irradiation: Secondary | ICD-10-CM | POA: Insufficient documentation

## 2018-09-25 DIAGNOSIS — Z9221 Personal history of antineoplastic chemotherapy: Secondary | ICD-10-CM | POA: Insufficient documentation

## 2018-09-25 DIAGNOSIS — Z5112 Encounter for antineoplastic immunotherapy: Secondary | ICD-10-CM | POA: Diagnosis not present

## 2018-09-25 DIAGNOSIS — Z79899 Other long term (current) drug therapy: Secondary | ICD-10-CM

## 2018-09-25 DIAGNOSIS — Z7982 Long term (current) use of aspirin: Secondary | ICD-10-CM | POA: Insufficient documentation

## 2018-09-25 LAB — CMP (CANCER CENTER ONLY)
ALT: 24 U/L (ref 0–44)
AST: 20 U/L (ref 15–41)
Albumin: 3.5 g/dL (ref 3.5–5.0)
Alkaline Phosphatase: 199 U/L — ABNORMAL HIGH (ref 38–126)
Anion gap: 11 (ref 5–15)
BUN: 11 mg/dL (ref 6–20)
CO2: 28 mmol/L (ref 22–32)
Calcium: 8.8 mg/dL — ABNORMAL LOW (ref 8.9–10.3)
Chloride: 96 mmol/L — ABNORMAL LOW (ref 98–111)
Creatinine: 0.94 mg/dL (ref 0.44–1.00)
GFR, Est AFR Am: >60 mL/min (ref >60)
GFR, Estimated: >60 mL/min (ref >60)
Glucose, Bld: 415 mg/dL — ABNORMAL HIGH (ref 70–99)
Potassium: 3.6 mmol/L (ref 3.5–5.1)
Sodium: 135 mmol/L (ref 135–145)
Total Bilirubin: 0.3 mg/dL (ref 0.3–1.2)
Total Protein: 7.4 g/dL (ref 6.5–8.1)

## 2018-09-25 LAB — CBC WITH DIFFERENTIAL (CANCER CENTER ONLY)
Abs Immature Granulocytes: 0.01 10*3/uL (ref 0.00–0.07)
Basophils Absolute: 0 10*3/uL (ref 0.0–0.1)
Basophils Relative: 0 %
Eosinophils Absolute: 0 10*3/uL (ref 0.0–0.5)
Eosinophils Relative: 0 %
HCT: 35 % — ABNORMAL LOW (ref 36.0–46.0)
Hemoglobin: 11.2 g/dL — ABNORMAL LOW (ref 12.0–15.0)
Immature Granulocytes: 1 %
Lymphocytes Relative: 32 %
Lymphs Abs: 0.6 10*3/uL — ABNORMAL LOW (ref 0.7–4.0)
MCH: 28.9 pg (ref 26.0–34.0)
MCHC: 32 g/dL (ref 30.0–36.0)
MCV: 90.2 fL (ref 80.0–100.0)
Monocytes Absolute: 0.2 10*3/uL (ref 0.1–1.0)
Monocytes Relative: 10 %
Neutro Abs: 1.1 10*3/uL — ABNORMAL LOW (ref 1.7–7.7)
Neutrophils Relative %: 57 %
Platelet Count: 175 10*3/uL (ref 150–400)
RBC: 3.88 MIL/uL (ref 3.87–5.11)
RDW: 13.5 % (ref 11.5–15.5)
WBC Count: 1.9 10*3/uL — ABNORMAL LOW (ref 4.0–10.5)
nRBC: 0 % (ref 0.0–0.2)

## 2018-09-25 LAB — SAVE SMEAR(SSMR), FOR PROVIDER SLIDE REVIEW

## 2018-09-25 MED ORDER — VENLAFAXINE HCL ER 37.5 MG PO CP24: 37.5000 mg | ORAL_CAPSULE | Freq: Every day | ORAL | 4 refills | Status: DC

## 2018-09-25 MED ORDER — DIPHENHYDRAMINE HCL 25 MG PO CAPS
25.0000 mg | ORAL_CAPSULE | Freq: Once | ORAL | Status: AC
  Administered 2018-09-25: 25 mg via ORAL

## 2018-09-25 MED ORDER — TAMOXIFEN CITRATE 20 MG PO TABS: 20.0000 mg | ORAL_TABLET | Freq: Every day | ORAL | 12 refills | Status: AC

## 2018-09-25 MED ORDER — ACETAMINOPHEN 325 MG PO TABS
650.0000 mg | ORAL_TABLET | Freq: Once | ORAL | Status: AC
  Administered 2018-09-25: 650 mg via ORAL

## 2018-09-25 MED ORDER — ACETAMINOPHEN 325 MG PO TABS
ORAL_TABLET | ORAL | Status: AC
  Filled 2018-09-25: qty 2

## 2018-09-25 MED ORDER — DIPHENHYDRAMINE HCL 25 MG PO CAPS
ORAL_CAPSULE | ORAL | Status: AC
  Filled 2018-09-25: qty 1

## 2018-09-25 MED ORDER — SODIUM CHLORIDE 0.9% FLUSH
10.0000 mL | INTRAVENOUS | Status: DC | PRN
  Administered 2018-09-25: 10 mL
  Filled 2018-09-25: qty 10

## 2018-09-25 MED ORDER — TRASTUZUMAB CHEMO 150 MG IV SOLR
6.0000 mg/kg | Freq: Once | INTRAVENOUS | Status: AC
  Administered 2018-09-25: 714 mg via INTRAVENOUS
  Filled 2018-09-25: qty 34

## 2018-09-25 MED ORDER — SODIUM CHLORIDE 0.9 % IV SOLN
Freq: Once | INTRAVENOUS | Status: AC
  Administered 2018-09-25: 12:00:00 via INTRAVENOUS
  Filled 2018-09-25: qty 250

## 2018-09-25 MED ORDER — HEPARIN SOD (PORK) LOCK FLUSH 100 UNIT/ML IV SOLN
500.0000 [IU] | Freq: Once | INTRAVENOUS | Status: AC | PRN
  Administered 2018-09-25: 500 [IU]
  Filled 2018-09-25: qty 5

## 2018-09-25 MED FILL — TAMOXIFEN CITRATE 20 MG TAB: 20 | 90 days supply | Qty: 90 | Fill #0

## 2018-09-25 MED FILL — VENLAFAXINE HCL ER 37.5 MG: 37.5 | 90 days supply | Qty: 90 | Fill #0

## 2018-09-25 NOTE — Patient Instructions (Signed)
[facility] Discharge Instructions for Patients Receiving Chemotherapy  Today you received the following chemotherapy agents: Trastuzumab (Herceptin)  To help prevent nausea and vomiting after your treatment, we encourage you to take your nausea medication as directed.    If you develop nausea and vomiting that is not controlled by your nausea medication, call the clinic.   BELOW ARE SYMPTOMS THAT SHOULD BE REPORTED IMMEDIATELY:  *FEVER GREATER THAN 100.5 F  *CHILLS WITH OR WITHOUT FEVER  NAUSEA AND VOMITING THAT IS NOT CONTROLLED WITH YOUR NAUSEA MEDICATION  *UNUSUAL SHORTNESS OF BREATH  *UNUSUAL BRUISING OR BLEEDING  TENDERNESS IN MOUTH AND THROAT WITH OR WITHOUT PRESENCE OF ULCERS  *URINARY PROBLEMS  *BOWEL PROBLEMS  UNUSUAL RASH Items with * indicate a potential emergency and should be followed up as soon as possible.  Feel free to call the clinic should you have any questions or concerns. The clinic phone number is [phone].  Please show the CHEMO ALERT CARD at check-in to the Emergency Department and triage nurse.    

## 2018-09-25 NOTE — Patient Instructions (Signed)

## 2018-09-26 ENCOUNTER — Ambulatory Visit (HOSPITAL_COMMUNITY)
Admission: RE | Admit: 2018-09-26 | Discharge: 2018-09-26 | Disposition: A | Discharge status: Home / Self Care | Payer: BC Managed Care – PPO | Source: Ambulatory Visit | Attending: Cardiology | Admitting: Cardiology

## 2018-09-26 ENCOUNTER — Ambulatory Visit (HOSPITAL_BASED_OUTPATIENT_CLINIC_OR_DEPARTMENT_OTHER)
Admission: RE | Admit: 2018-09-26 | Discharge: 2018-09-26 | Disposition: A | Discharge status: Home / Self Care | Payer: BC Managed Care – PPO | Source: Ambulatory Visit | Attending: Cardiology | Admitting: Cardiology

## 2018-09-26 VITALS — BP 132/88 | HR 97 | Wt 253.2 lb

## 2018-09-26 DIAGNOSIS — I340 Nonrheumatic mitral (valve) insufficiency: Secondary | ICD-10-CM | POA: Insufficient documentation

## 2018-09-26 DIAGNOSIS — Z170 Estrogen receptor positive status [ER+]: Secondary | ICD-10-CM

## 2018-09-26 DIAGNOSIS — Z800 Family history of malignant neoplasm of digestive organs: Secondary | ICD-10-CM | POA: Diagnosis not present

## 2018-09-26 DIAGNOSIS — I427 Cardiomyopathy due to drug and external agent: Secondary | ICD-10-CM

## 2018-09-26 DIAGNOSIS — E119 Type 2 diabetes mellitus without complications: Secondary | ICD-10-CM | POA: Diagnosis not present

## 2018-09-26 DIAGNOSIS — Z833 Family history of diabetes mellitus: Secondary | ICD-10-CM | POA: Insufficient documentation

## 2018-09-26 DIAGNOSIS — I10 Essential (primary) hypertension: Secondary | ICD-10-CM | POA: Diagnosis not present

## 2018-09-26 DIAGNOSIS — T451X5A Adverse effect of antineoplastic and immunosuppressive drugs, initial encounter: Secondary | ICD-10-CM | POA: Insufficient documentation

## 2018-09-26 DIAGNOSIS — Z801 Family history of malignant neoplasm of trachea, bronchus and lung: Secondary | ICD-10-CM | POA: Insufficient documentation

## 2018-09-26 DIAGNOSIS — J45909 Unspecified asthma, uncomplicated: Secondary | ICD-10-CM | POA: Diagnosis not present

## 2018-09-26 DIAGNOSIS — Z90710 Acquired absence of both cervix and uterus: Secondary | ICD-10-CM | POA: Insufficient documentation

## 2018-09-26 DIAGNOSIS — Z8249 Family history of ischemic heart disease and other diseases of the circulatory system: Secondary | ICD-10-CM | POA: Diagnosis not present

## 2018-09-26 DIAGNOSIS — Z8042 Family history of malignant neoplasm of prostate: Secondary | ICD-10-CM | POA: Insufficient documentation

## 2018-09-26 DIAGNOSIS — Z803 Family history of malignant neoplasm of breast: Secondary | ICD-10-CM | POA: Insufficient documentation

## 2018-09-26 DIAGNOSIS — C50411 Malignant neoplasm of upper-outer quadrant of right female breast: Secondary | ICD-10-CM | POA: Diagnosis not present

## 2018-09-26 DIAGNOSIS — B20 Human immunodeficiency virus [HIV] disease: Secondary | ICD-10-CM | POA: Insufficient documentation

## 2018-09-26 DIAGNOSIS — Z853 Personal history of malignant neoplasm of breast: Secondary | ICD-10-CM | POA: Insufficient documentation

## 2018-09-26 DIAGNOSIS — Z9221 Personal history of antineoplastic chemotherapy: Secondary | ICD-10-CM | POA: Diagnosis not present

## 2018-09-26 MED ORDER — LOSARTAN POTASSIUM 25 MG PO TABS: 25.0000 mg | ORAL_TABLET | Freq: Every day | ORAL | 3 refills | Status: DC

## 2018-09-26 MED ORDER — CARVEDILOL 3.125 MG PO TABS: 3.1250 mg | ORAL_TABLET | Freq: Two times a day (BID) | ORAL | 3 refills | Status: DC

## 2018-09-26 NOTE — Progress Notes (Signed)
  Echocardiogram 2D Echocardiogram has been performed.  Kelly Rowe L Androw 09/26/2018, 2:43 PM

## 2018-09-26 NOTE — Patient Instructions (Signed)
STOP Herceptin.  Take Coreg 3.125mg  twice daily.  Take Losartan 25mg  daily.  Follow up and echo with Dr.McLean in 6 weeks.

## 2018-09-26 NOTE — Progress Notes (Signed)
Oncologist: Dr. Jana Hakim  48 yo with history of type II diabetes, HIV, and breast cancer was referred by Dr. Jana Hakim for cardio-oncology evaluation.  Breast cancer on right was diagnosed by biopsy in 6/19.  ER+/PR+/HER2+.  She has had chemotherapy then right lumpectomy in 8/19.  She is planned to continue Herceptin for a year. She has completed radiation.   She is doing well symptomatically. No exertional dyspnea or chest pain.  Echo was reviewed today.  EF noted to be lower and strain less negative.   ECG (personally reviewed): NSR, normal.   PMH: 1. Type 2 diabetes 2. Asthma 3. HIV 4. Breast cancer: On right, diagnosed by biopsy in 6/19.  ER+/PR+/HER2+.  She had chemotherapy then lumpectomy in 8/19.  She will be on Herceptin for a year.   - Echo (7/19): EF 55-60%, mild LVH, GLS -17.9%, normal RV size and systolic function.  - Echo (11/19): EF 55-60%, GLS -54.2%, normal diastolic function, normal RV.  - Echo (3/20): EF 45-50%, mild diffuse hypokinesis, normal RV size and systolic function, GLS -70.6%.   Social History   Socioeconomic History  . Marital status: Divorced    Spouse name: Not on file  . Number of children: Not on file  . Years of education: Not on file  . Highest education level: Not on file  Occupational History  . Not on file  Social Needs  . Financial resource strain: Not on file  . Food insecurity:    Worry: Not on file    Inability: Not on file  . Transportation needs:    Medical: No    Non-medical: No  Tobacco Use  . Smoking status: Never Smoker  . Smokeless tobacco: Never Used  Substance and Sexual Activity  . Alcohol use: Never    Alcohol/week: 0.0 standard drinks    Frequency: Never  . Drug use: No  . Sexual activity: Not Currently    Birth control/protection: Surgical    Comment: HAS HAD A HYSTERECTOMY  Lifestyle  . Physical activity:    Days per week: Not on file    Minutes per session: Not on file  . Stress: Not on file  Relationships  .  Social connections:    Talks on phone: Not on file    Gets together: Not on file    Attends religious service: Not on file    Active member of club or organization: Not on file    Attends meetings of clubs or organizations: Not on file    Relationship status: Not on file  Other Topics Concern  . Not on file  Social History Narrative  . Not on file   Family History  Problem Relation Age of Onset  . Prostate cancer Other   . Heart disease Other   . Stroke Other   . Diabetes Mother   . Hypertension Mother   . Diabetes Father   . Cancer Father        COLON and LU NG  . Stroke Paternal Uncle   . Breast cancer Paternal Aunt   . Lung cancer Maternal Grandmother 82       Mesothelioma  . Colon cancer Paternal Grandfather        dx over 69s  . Lung cancer Paternal Aunt   . Breast cancer Paternal Aunt   . Breast cancer Paternal Aunt   . Heart attack Maternal Grandfather 71  . Colon cancer Other        MGM's 5 brothers   ROS:  All systems reviewed and negative except as per HPI.   BP 132/88   Pulse 97   Wt 114.9 kg (253 lb 3.2 oz)   LMP 06/21/2010   SpO2 96%   BMI 46.31 kg/m  General: NAD Neck: No JVD, no thyromegaly or thyroid nodule.  Lungs: Clear to auscultation bilaterally with normal respiratory effort. CV: Nondisplaced PMI.  Heart regular S1/S2, no S3/S4, no murmur.  No peripheral edema.  No carotid bruit.  Normal pedal pulses.  Abdomen: Soft, nontender, no hepatosplenomegaly, no distention.  Skin: Intact without lesions or rashes.  Neurologic: Alert and oriented x 3.  Psych: Normal affect. Extremities: No clubbing or cyanosis.  HEENT: Normal.   Assessment/Plan: 1. Chemotherapy-induced cardiomyopathy: Echo was done today and reviewed, showing fall in EF and less negative strain. ECG was normal.  This is concerning for Herceptin-mediated cardiomyopathy.  She is not in overt CHF, no volume overload on exam and NYHA class I-II symptoms.  - I will have her start Coreg  3.125 mg bid and losartan 25 mg daily.  BMET in 10 days with addition of losartan.  - I think that she should hold Herceptin for now, will send a message to Dr. Jana Hakim.  I will repeat an echo in 6 wks and if EF is improved, can carefully restart Herceptin while continuing cardiac meds.   Loralie Champagne 09/27/2018

## 2018-09-27 ENCOUNTER — Telehealth: Payer: Self-pay | Admitting: Oncology

## 2018-09-27 ENCOUNTER — Other Ambulatory Visit: Payer: Self-pay | Admitting: Oncology

## 2018-09-27 NOTE — Telephone Encounter (Signed)
Cancelled appt per 3/3 sch message - pt aware of appt changes

## 2018-09-28 ENCOUNTER — Inpatient Hospital Stay: Payer: BC Managed Care – PPO | Admitting: General Practice

## 2018-09-28 ENCOUNTER — Encounter: Payer: Self-pay | Admitting: General Practice

## 2018-09-28 NOTE — Progress Notes (Signed)
[facility] CSW Progress Notes  Met w patient to discuss applications for community financial assistance.  Submitted [facility] to agency as all required documents were provided.  Can submit [place] when MD verification form is signed by MD.  Patient to bring in verification of employment for [employer] - can be submitted at that time.  Patient needs to sign [place] application prior to [employer].    Edwyna Shell, LCSW Clinical Social Worker Phone:  [phone]

## 2018-09-29 ENCOUNTER — Encounter: Payer: Self-pay | Admitting: General Practice

## 2018-09-29 NOTE — Progress Notes (Signed)
[facility] CSW Progress Notes  [employer] [place] financial aid application submitted by fax.  Edwyna Shell, LCSW Clinical Social Worker Phone:  [phone]

## 2018-10-03 ENCOUNTER — Other Ambulatory Visit (INDEPENDENT_AMBULATORY_CARE_PROVIDER_SITE_OTHER): Payer: BC Managed Care – PPO

## 2018-10-03 ENCOUNTER — Encounter: Payer: Self-pay | Admitting: Internal Medicine

## 2018-10-03 ENCOUNTER — Ambulatory Visit: Payer: BC Managed Care – PPO | Admitting: Internal Medicine

## 2018-10-03 VITALS — BP 126/84 | HR 92 | Temp 99.2°F | Ht 62.0 in | Wt 254.0 lb

## 2018-10-03 DIAGNOSIS — E119 Type 2 diabetes mellitus without complications: Secondary | ICD-10-CM

## 2018-10-03 DIAGNOSIS — Z0000 Encounter for general adult medical examination without abnormal findings: Secondary | ICD-10-CM

## 2018-10-03 LAB — LIPID PANEL
Cholesterol: 199 mg/dL (ref 0–200)
HDL: 34.1 mg/dL — ABNORMAL LOW (ref >39.00)
NonHDL: 164.48
Total CHOL/HDL Ratio: 6
Triglycerides: 202 mg/dL — ABNORMAL HIGH (ref 0.0–149.0)
VLDL: 40.4 mg/dL — ABNORMAL HIGH (ref 0.0–40.0)

## 2018-10-03 LAB — URINALYSIS, ROUTINE W REFLEX MICROSCOPIC
Bilirubin Urine: NEGATIVE
Hgb urine dipstick: NEGATIVE
Ketones, ur: NEGATIVE
Leukocytes,Ua: NEGATIVE
Nitrite: NEGATIVE
RBC / HPF: NONE SEEN (ref 0–?)
Specific Gravity, Urine: 1.015 (ref 1.000–1.030)
Total Protein, Urine: NEGATIVE
Urine Glucose: >=1000 — AB
Urobilinogen, UA: 0.2 (ref 0.0–1.0)
pH: 5.5 (ref 5.0–8.0)

## 2018-10-03 LAB — MICROALBUMIN / CREATININE URINE RATIO
Creatinine,U: 59 mg/dL
Microalb Creat Ratio: 1.2 mg/g (ref 0.0–30.0)
Microalb, Ur: <0.7 mg/dL (ref 0.0–1.9)

## 2018-10-03 LAB — HEMOGLOBIN A1C: Hgb A1c MFr Bld: 12.1 % — ABNORMAL HIGH (ref 4.6–6.5)

## 2018-10-03 LAB — LDL CHOLESTEROL, DIRECT: Direct LDL: 143 mg/dL

## 2018-10-03 LAB — TSH: TSH: 1.86 u[IU]/mL (ref 0.35–4.50)

## 2018-10-03 NOTE — Assessment & Plan Note (Addendum)
Overall stable, for f/u a1c, for optho referral

## 2018-10-03 NOTE — Progress Notes (Signed)
Disability successfully faxed to [facility] at [phone]. Mailed copy to patient address on file.

## 2018-10-03 NOTE — Assessment & Plan Note (Signed)

## 2018-10-03 NOTE — Progress Notes (Signed)
Subjective:    Patient ID: Kelly Rowe, female    DOB: 12/03/1970, 48 y.o.   MRN: [id number]  HPI   Here for wellness and f/u;  Overall doing ok;  Pt denies Chest pain, worsening SOB, DOE, wheezing, orthopnea, PND, worsening LE edema, palpitations, dizziness or syncope.  Pt denies neurological change such as new headache, facial or extremity weakness.  Pt denies polydipsia, polyuria, or low sugar symptoms. Pt states overall good compliance with treatment and medications, good tolerability, and has been trying to follow appropriate diet.  Pt denies worsening depressive symptoms, suicidal ideation or panic. No fever, night sweats, wt loss, loss of appetite, or other constitutional symptoms.  Pt states good ability with ADL's, has low fall risk, home safety reviewed and adequate, no other significant changes in hearing or vision, and only occasionally active with exercise. S/p recent breast ca, s/p surgury/xrt/cmt with herceptin on hold for now.  No new complaints  Did have recent glucose > 400 after cake at a bday party Past Medical History:  Diagnosis Date  . ANEMIA-IRON DEFICIENCY 01/30/2010  . ANXIETY 11/27/2007  . Arthritis   . Asthma 02/25/2011  . Cancer ([place])    breast  . Carbuncle and furuncle of trunk 04/16/2010  . Chlamydia infection 03/21/2008  . DIABETES MELLITUS, TYPE II 08/02/2007  . Edema 01/30/2010  . ELEVATED BLOOD PRESSURE WITHOUT DIAGNOSIS OF HYPERTENSION 08/02/2007  . Family history of breast cancer   . Family history of colon cancer   . Family history of lung cancer   . FREQUENCY, URINARY 05/19/2009  . GENITAL HERPES 03/12/2009  . GERD (gastroesophageal reflux disease)   . History of kidney stones   . HIV (human immunodeficiency virus infection) ([place]) 2009  . HIV INFECTION 03/12/2009  . HSV (herpes simplex virus) infection   . HYPERLIPIDEMIA 08/02/2007  . Hypertension   . Metrorrhagia 03/21/2008  . PONV (postoperative nausea and vomiting)    woke up crying per patient   .  RETENTION, URINE 05/19/2009  . Trichomonas infection 01/19/2010  . VITAMIN D DEFICIENCY 01/30/2010   Past Surgical History:  Procedure Laterality Date  . ABDOMINAL HYSTERECTOMY  07/22/2010   TAH WITH PRESERVATION OF BOTH TUBES AND OVARIES  . BREAST LUMPECTOMY WITH RADIOACTIVE SEED AND SENTINEL LYMPH NODE BIOPSY Right 02/23/2018   Procedure: BREAST LUMPECTOMY WITH RADIOACTIVE SEED AND SENTINEL LYMPH NODE BIOPSY;  Surgeon: Stark Klein, MD;  Location: [facility];  Service: General;  Laterality: Right;  . CHOLECYSTECTOMY    . ENDOMETRIAL ABLATION  01/11/2008   HER OPTION  . ESOPHAGOGASTRODUODENOSCOPY    . MULTIPLE TOOTH EXTRACTIONS    . PORTACATH PLACEMENT Left 02/23/2018   Procedure: INSERTION PORT-A-CATH;  Surgeon: Stark Klein, MD;  Location: [facility];  Service: General;  Laterality: Left;  . PORTACATH PLACEMENT N/A 04/21/2018   Procedure: PORT-A-CATH REVISION;  Surgeon: Stark Klein, MD;  Location: [facility];  Service: General;  Laterality: N/A;  . TUBAL LIGATION    . WISDOM TOOTH EXTRACTION      reports that she has never smoked. She has never used smokeless tobacco. She reports that she does not drink alcohol or use drugs. family history includes Breast cancer in her paternal aunt, paternal aunt, and paternal aunt; Cancer in her father; Colon cancer in her paternal grandfather and another family member; Diabetes in her father and mother; Heart attack (age of onset: 57) in her maternal grandfather; Heart disease in an other family member; Hypertension in her mother; Lung cancer in her  paternal aunt; Lung cancer (age of onset: 49) in her maternal grandmother; Prostate cancer in an other family member; Stroke in her paternal uncle and another family member. Allergies  Allergen Reactions  . Levaquin [Levofloxacin In D5w] Nausea And Vomiting and Rash  . Penicillins Swelling and Rash    Has patient had a PCN reaction causing immediate rash, facial/tongue/throat swelling, SOB or lightheadedness with  hypotension: Yes Has patient had a PCN reaction causing severe rash involving mucus membranes or skin necrosis: No Has patient had a PCN reaction that required hospitalization: Yes Has patient had a PCN reaction occurring within the last 10 years: No If all of the above answers are "NO", then may proceed with Cephalosporin use.   Current Outpatient Medications on File Prior to Visit  Medication Sig Dispense Refill  . albuterol (PROVENTIL HFA;VENTOLIN HFA) 108 (90 Base) MCG/ACT inhaler Inhale 2 puffs into the lungs every 6 (six) hours as needed for wheezing or shortness of breath.    Marland Kitchen aspirin 81 MG tablet Take 81 mg by mouth daily as needed (chest pain).     . Azelastine-Fluticasone (DYMISTA) 137-50 MCG/ACT SUSP Use as directed 1 spray each side twice per day as needed 23 g 5  . Beclomethasone Dipropionate (QNASL) 80 MCG/ACT AERS Place 2 sprays into the nose daily. 1 Inhaler 1  . bictegravir-emtricitabine-tenofovir AF (BIKTARVY) 50-200-25 MG TABS tablet Take 1 tablet by mouth daily.    . blood glucose meter kit and supplies Dispense based on patient and insurance preference. Use up to four times daily as directed. (FOR ICD-10 E10.9, E11.9). 1 each 0  . carvedilol (COREG) 3.125 MG tablet Take 1 tablet (3.125 mg total) by mouth 2 (two) times daily. 60 tablet 3  . Diclofenac Sodium (PENNSAID) 2 % SOLN Place 1 application onto the skin 2 (two) times daily. (Patient taking differently: Place 1 application onto the skin 2 (two) times daily as needed. ) 1 Bottle 3  . Dulaglutide (TRULICITY) 1.5 HE/5.2DP SOPN 0.5 ml weekly SQ 6 mL 3  . famotidine (PEPCID) 20 MG tablet Take 1 tablet (20 mg total) by mouth 2 (two) times daily. (Patient taking differently: Take 20 mg by mouth daily as needed for heartburn. ) 60 tablet 3  . ferrous gluconate (IRON 27) 240 (27 FE) MG tablet Take 240 mg by mouth daily as needed (iron).     . fluconazole (DIFLUCAN) 100 MG tablet TAKE 1 TABLET BY MOUTH ONCE DAILY 3 tablet 0  .  glipiZIDE (GLIPIZIDE XL) 5 MG 24 hr tablet Take 1 tablet (5 mg total) by mouth daily with breakfast. 90 tablet 3  . ibuprofen (ADVIL,MOTRIN) 800 MG tablet Take 1 tablet (800 mg total) by mouth every 8 (eight) hours as needed. (Patient taking differently: Take 800 mg by mouth daily as needed for moderate pain. ) 60 tablet 0  . lidocaine-prilocaine (EMLA) cream Apply to affected area once (Patient taking differently: Apply 1 application topically once. Apply to affected area once) 30 g 3  . loratadine (CLARITIN) 10 MG tablet Take 10 mg by mouth daily.    Marland Kitchen LORazepam (ATIVAN) 0.5 MG tablet Take 1 tablet (0.5 mg total) by mouth at bedtime as needed (Nausea or vomiting). 30 tablet 0  . losartan (COZAAR) 25 MG tablet Take 1 tablet (25 mg total) by mouth daily. 30 tablet 3  . metFORMIN (GLUCOPHAGE-XR) 500 MG 24 hr tablet Take 2 tablets (1,000 mg total) by mouth daily with breakfast. Annual appt is due must see provider  for future refills 180 tablet 3  . Multiple Vitamin (MULTIVITAMIN WITH MINERALS) TABS tablet Take 1 tablet by mouth daily.    Marland Kitchen omeprazole (PRILOSEC) 40 MG capsule Take 1 capsule (40 mg total) by mouth daily. 30 capsule 0  . ondansetron (ZOFRAN) 8 MG tablet Take 1 tablet (8 mg total) by mouth every 8 (eight) hours as needed for nausea or vomiting. 20 tablet 0  . oxyCODONE (OXY IR/ROXICODONE) 5 MG immediate release tablet Take 1-2 tablets (5-10 mg total) by mouth every 6 (six) hours as needed for moderate pain, severe pain or breakthrough pain. 30 tablet 0  . prochlorperazine (COMPAZINE) 10 MG tablet Take 1 tablet (10 mg total) by mouth every 6 (six) hours as needed (Nausea or vomiting). 30 tablet 1  . silver sulfADIAZINE (SILVADENE) 1 % cream Apply 1 application topically 2 (two) times daily. 400 g 1  . tamoxifen (NOLVADEX) 20 MG tablet Take 1 tablet (20 mg total) by mouth daily for 30 days. 90 tablet 12  . valACYclovir (VALTREX) 500 MG tablet Take twice daily for 3-5 days 30 tablet 12  .  venlafaxine XR (EFFEXOR-XR) 37.5 MG 24 hr capsule Take 1 capsule (37.5 mg total) by mouth daily with breakfast. 90 capsule 4  . zolpidem (AMBIEN) 10 MG tablet Take 1 tablet (10 mg total) by mouth at bedtime as needed for sleep. 90 tablet 1   Current Facility-Administered Medications on File Prior to Visit  Medication Dose Route Frequency Provider Last Rate Last Dose  . 0.9 %  sodium chloride infusion   Intravenous Once [name], [name], MD      . sodium chloride 0.9 % 1,000 mL with potassium chloride 10 mEq infusion   Intravenous Once [name], [name], MD      . sodium chloride flush (NS) 0.9 % injection 10 mL  10 mL Intracatheter PRN [name], [name], MD   10 mL at 09/04/18 1425   Review of Systems Constitutional: Negative for other unusual diaphoresis, sweats, appetite or weight changes HENT: Negative for other worsening hearing loss, ear pain, facial swelling, mouth sores or neck stiffness.   Eyes: Negative for other worsening pain, redness or other visual disturbance.  Respiratory: Negative for other stridor or swelling Cardiovascular: Negative for other palpitations or other chest pain  Gastrointestinal: Negative for worsening diarrhea or loose stools, blood in stool, distention or other pain Genitourinary: Negative for hematuria, flank pain or other change in urine volume.  Musculoskeletal: Negative for myalgias or other joint swelling.  Skin: Negative for other color change, or other wound or worsening drainage.  Neurological: Negative for other syncope or numbness. Hematological: Negative for other adenopathy or swelling Psychiatric/Behavioral: Negative for hallucinations, other worsening agitation, SI, self-injury, or new decreased concentration All other system neg per pt    Objective:   Physical Exam BP 126/84   Pulse 92   Temp 99.2 F (37.3 C) (Oral) Comment: Pt was drinking coffee prior to arrival  Ht _0  (1.575 m)   Wt 254 lb (115.2 kg)   LMP 06/21/2010   SpO2  96%   BMI 46.46 kg/m  VS noted,  Constitutional: Pt is oriented to person, place, and time. Appears well-developed and well-nourished, in no significant distress and comfortable Head: Normocephalic and atraumatic  Eyes: Conjunctivae and EOM are normal. Pupils are equal, round, and reactive to light Right Ear: External ear normal without discharge Left Ear: External ear normal without discharge Nose: Nose without discharge or deformity Mouth/Throat: Oropharynx is without  other ulcerations and moist  Neck: Normal range of motion. Neck supple. No JVD present. No tracheal deviation present or significant neck LA or mass Cardiovascular: Normal rate, regular rhythm, normal heart sounds and intact distal pulses.   Pulmonary/Chest: WOB normal and breath sounds without rales or wheezing  Abdominal: Soft. Bowel sounds are normal. NT. No HSM  Musculoskeletal: Normal range of motion. Exhibits no edema Lymphadenopathy: Has no other cervical adenopathy.  Neurological: Pt is alert and oriented to person, place, and time. Pt has normal reflexes. No cranial nerve deficit. Motor grossly intact, Gait intact Skin: Skin is warm and dry. No rash noted or new ulcerations, has marked alopecia Psychiatric:  Has normal mood and affect. Behavior is normal without agitation No other exam findings  Lab Results  Component Value Date   WBC 1.9 (L) 09/25/2018   HGB 11.2 (L) 09/25/2018   HCT 35.0 (L) 09/25/2018   PLT 175 09/25/2018   GLUCOSE 415 (H) 09/25/2018   CHOL 202 (H) 09/08/2017   TRIG 92.0 09/08/2017   HDL 48.80 09/08/2017   LDLDIRECT 147.7 07/05/2012   LDLCALC 135 (H) 09/08/2017   ALT 24 09/25/2018   AST 20 09/25/2018   NA 135 09/25/2018   K 3.6 09/25/2018   CL 96 (L) 09/25/2018   CREATININE 0.94 09/25/2018   BUN 11 09/25/2018   CO2 28 09/25/2018   TSH 1.41 09/27/2017   HGBA1C 7.4 (H) 02/15/2018   MICROALBUR 1.2 09/27/2017       Assessment & Plan:

## 2018-10-03 NOTE — Patient Instructions (Signed)
Please continue all other medications as before, and refills have been done if requested.  Please have the pharmacy call with any other refills you may need.  Please continue your efforts at being more active, low cholesterol diet, and weight control.  You are otherwise up to date with prevention measures today.  Please keep your appointments with your specialists as you may have planned  You will be contacted regarding the referral for: eye doctor  Please go to the LAB in the Basement (turn left off the elevator) for the tests to be done today  You will be contacted by phone if any changes need to be made immediately.  Otherwise, you will receive a letter about your results with an explanation, but please check with MyChart first.  Please remember to sign up for MyChart if you have not done so, as this will be important to you in the future with finding out test results, communicating by private email, and scheduling acute appointments online when needed.  Please return in 6 months, or sooner if needed, with Lab testing done 3-5 days before

## 2018-10-04 ENCOUNTER — Telehealth: Payer: Self-pay

## 2018-10-04 ENCOUNTER — Encounter: Payer: Self-pay | Admitting: Radiation Oncology

## 2018-10-04 ENCOUNTER — Telehealth: Payer: Self-pay | Admitting: General Practice

## 2018-10-04 DIAGNOSIS — E119 Type 2 diabetes mellitus without complications: Secondary | ICD-10-CM

## 2018-10-04 NOTE — Progress Notes (Signed)
Ms. Joffe presents for follow up of radiation completed 09/06/18 to her Right breast, Axillary and Supraclavicular nodes. She continues to see Dr. Jana Hakim for Trastuzumab infusions every 21 days. She has recently started on Tamoxifen. She will see him again on 11/27/18.   Pt here today for a follow-up appointment. Pt states that she has some pain from surgery. Pt denies having fatigue. Pt states that she is still using radiaplex and neosporin on her skin.   BP 133/82 (BP Location: Left Arm, Patient Position: Sitting)   Pulse 79   Temp 97.7 F (36.5 C) (Oral)   Resp 18   Ht 5\' 2"  (1.575 m)   Wt 252 lb (114.3 kg)   LMP 06/21/2010   SpO2 100%   BMI 46.09 kg/m    Wt Readings from Last 3 Encounters:  10/06/18 252 lb (114.3 kg)  10/03/18 254 lb (115.2 kg)  09/26/18 253 lb 3.2 oz (114.9 kg)

## 2018-10-04 NOTE — Telephone Encounter (Signed)
[facility] CSW Progress Notes  Call from patient, scheduled appt to complete [place] and [employer], advertising.  Will also enroll in [place] one $50 gas card on 3/13.  Edwyna Shell, LCSW Clinical Social Worker Phone:  [phone]

## 2018-10-04 NOTE — Telephone Encounter (Signed)
-----   Message from Biagio Borg, MD sent at 10/04/2018  8:07 AM EDT ----- Andrei Mccook to contact pt please  A1c is Much worse this time  - has severe out of control sugars  Does she actually take all of her medications?  Has she seen DM education in the past and would she want to go?

## 2018-10-04 NOTE — Telephone Encounter (Signed)
Called pt, LVM.   CRM created.  

## 2018-10-04 NOTE — Telephone Encounter (Signed)
Patient is returning a call to [employer].  Shirron was with patient and said she would call the patient back with results.  CB# [phone].

## 2018-10-05 ENCOUNTER — Encounter: Payer: Self-pay | Admitting: Internal Medicine

## 2018-10-05 MED ORDER — EMPAGLIFLOZIN 25 MG PO TABS: 25.0000 mg | ORAL_TABLET | Freq: Every day | ORAL | 3 refills | Status: DC

## 2018-10-05 MED ORDER — DULAGLUTIDE 1.5 MG/0.5ML ~~LOC~~ SOAJ: SUBCUTANEOUS | 3 refills | Status: DC

## 2018-10-05 MED ORDER — GLIPIZIDE ER 10 MG PO TB24: 10.0000 mg | ORAL_TABLET | Freq: Every day | ORAL | 3 refills | Status: DC

## 2018-10-05 NOTE — Telephone Encounter (Signed)
Pt has been informed of results and expressed understanding.  °

## 2018-10-05 NOTE — Telephone Encounter (Signed)
Pt given lab results per notes of Dr. Jenny Reichmann on 10/04/18 below. Pt verbalized understanding. She says the only diabetes medication she takes is Glipizide. She says she stopped the Metformin because she was having frequent BM's. She says she ran out of Trulicity and didn't get the refill because she was going through chemo at the time and just didn't think about it anymore. She says she has not gone to a diabetes educator, but is willing to go. I advised I will send this over to Dr. Jenny Reichmann and someone will call with his recommendations and appointment with educator.

## 2018-10-05 NOTE — Progress Notes (Signed)
Radiation Oncology         [phone] ________________________________  Name: Kelly Rowe MRN: [id number]  Date: 10/06/2018  DOB: 25-Nov-1970  Follow-Up Visit Note  Outpatient  CC: Biagio Borg, MD  [name], [name], MD  Diagnosis and Prior Radiotherapy:   Cancer Staging Malignant neoplasm of upper-outer quadrant of right breast in female, estrogen receptor positive ([place]) Staging form: Breast, AJCC 8th Edition - Clinical stage from 01/18/2018: Stage IIIB (cT1c, cN3b, cM0, G3, ER+, PR+, HER2+) - Signed by [name], MD on 07/18/2018 - Pathologic: Stage IB (pT2, pN1a, cM0, G3, ER+, PR+, HER2+) - Signed by Gardenia Phlegm, NP on 03/08/2018  Radiation treatment dates:   07/27/2018 - 09/06/2018  Site/dose:    1. Right Breast / 50 Gy in 25 fractions 2. Right Axillary and Supraclavicular nodes / 50 Gy in 25 fractions 3. Right Breast Boost / 10 Gy in 5 fractions  CHIEF COMPLAINT: Here for follow-up and surveillance of right breast cancer  Narrative:  The patient returns today for routine follow-up following completion of RT to her right breast, axillary, and SCV nodes on 09/06/2018.  Doing well.  Skin has healed with some nipple dryness.    She maintains adequate follow up with Medical Oncologist, Dr. Jana Hakim for Trastuzumab infusions every 21 days. She has recently started on Tamoxifen. She has a follow up appointment with Dr. Jana Hakim on 11/27/2018.                          ALLERGIES:  is allergic to metformin and related; levaquin [levofloxacin in d5w]; and penicillins.  Meds: Current Outpatient Medications  Medication Sig Dispense Refill  . albuterol (PROVENTIL HFA;VENTOLIN HFA) 108 (90 Base) MCG/ACT inhaler Inhale 2 puffs into the lungs every 6 (six) hours as needed for wheezing or shortness of breath.    Marland Kitchen aspirin 81 MG tablet Take 81 mg by mouth daily as needed (chest pain).     . Azelastine-Fluticasone (DYMISTA) 137-50 MCG/ACT SUSP Use as directed 1 spray  each side twice per day as needed 23 g 5  . Beclomethasone Dipropionate (QNASL) 80 MCG/ACT AERS Place 2 sprays into the nose daily. 1 Inhaler 1  . bictegravir-emtricitabine-tenofovir AF (BIKTARVY) 50-200-25 MG TABS tablet Take 1 tablet by mouth daily.    . blood glucose meter kit and supplies Dispense based on patient and insurance preference. Use up to four times daily as directed. (FOR ICD-10 E10.9, E11.9). 1 each 0  . carvedilol (COREG) 3.125 MG tablet Take 1 tablet (3.125 mg total) by mouth 2 (two) times daily. 60 tablet 3  . Diclofenac Sodium (PENNSAID) 2 % SOLN Place 1 application onto the skin 2 (two) times daily. (Patient taking differently: Place 1 application onto the skin 2 (two) times daily as needed. ) 1 Bottle 3  . Dulaglutide (TRULICITY) 1.5 BT/6.6MA SOPN 0.5 ml weekly SQ 6 mL 3  . empagliflozin (JARDIANCE) 25 MG TABS tablet Take 25 mg by mouth daily. 90 tablet 3  . famotidine (PEPCID) 20 MG tablet Take 1 tablet (20 mg total) by mouth 2 (two) times daily. (Patient taking differently: Take 20 mg by mouth daily as needed for heartburn. ) 60 tablet 3  . ferrous gluconate (IRON 27) 240 (27 FE) MG tablet Take 240 mg by mouth daily as needed (iron).     . fluconazole (DIFLUCAN) 100 MG tablet TAKE 1 TABLET BY MOUTH ONCE DAILY 3 tablet 0  . glipiZIDE (GLUCOTROL XL)  10 MG 24 hr tablet Take 1 tablet (10 mg total) by mouth daily with breakfast. 90 tablet 3  . ibuprofen (ADVIL,MOTRIN) 800 MG tablet Take 1 tablet (800 mg total) by mouth every 8 (eight) hours as needed. (Patient taking differently: Take 800 mg by mouth daily as needed for moderate pain. ) 60 tablet 0  . lidocaine-prilocaine (EMLA) cream Apply to affected area once (Patient taking differently: Apply 1 application topically once. Apply to affected area once) 30 g 3  . loratadine (CLARITIN) 10 MG tablet Take 10 mg by mouth daily.    Marland Kitchen LORazepam (ATIVAN) 0.5 MG tablet Take 1 tablet (0.5 mg total) by mouth at bedtime as needed (Nausea or  vomiting). 30 tablet 0  . losartan (COZAAR) 25 MG tablet Take 1 tablet (25 mg total) by mouth daily. 30 tablet 3  . Multiple Vitamin (MULTIVITAMIN WITH MINERALS) TABS tablet Take 1 tablet by mouth daily.    Marland Kitchen omeprazole (PRILOSEC) 40 MG capsule Take 1 capsule (40 mg total) by mouth daily. 30 capsule 0  . ondansetron (ZOFRAN) 8 MG tablet Take 1 tablet (8 mg total) by mouth every 8 (eight) hours as needed for nausea or vomiting. 20 tablet 0  . oxyCODONE (OXY IR/ROXICODONE) 5 MG immediate release tablet Take 1-2 tablets (5-10 mg total) by mouth every 6 (six) hours as needed for moderate pain, severe pain or breakthrough pain. 30 tablet 0  . prochlorperazine (COMPAZINE) 10 MG tablet Take 1 tablet (10 mg total) by mouth every 6 (six) hours as needed (Nausea or vomiting). 30 tablet 1  . silver sulfADIAZINE (SILVADENE) 1 % cream Apply 1 application topically 2 (two) times daily. 400 g 1  . tamoxifen (NOLVADEX) 20 MG tablet Take 1 tablet (20 mg total) by mouth daily for 30 days. 90 tablet 12  . valACYclovir (VALTREX) 500 MG tablet Take twice daily for 3-5 days 30 tablet 12  . venlafaxine XR (EFFEXOR-XR) 37.5 MG 24 hr capsule Take 1 capsule (37.5 mg total) by mouth daily with breakfast. 90 capsule 4  . zolpidem (AMBIEN) 10 MG tablet Take 1 tablet (10 mg total) by mouth at bedtime as needed for sleep. 90 tablet 1   No current facility-administered medications for this encounter.    Facility-Administered Medications Ordered in Other Encounters  Medication Dose Route Frequency Provider Last Rate Last Dose  . sodium chloride 0.9 % 1,000 mL with potassium chloride 10 mEq infusion   Intravenous Once [name], [name], MD      . sodium chloride flush (NS) 0.9 % injection 10 mL  10 mL Intracatheter PRN [name], [name], MD   10 mL at 09/04/18 1425    Physical Findings: The patient is in no acute distress. Patient is alert and oriented.  height is 5' 2"  (1.575 m) and weight is 252 lb (114.3 kg). Her oral  temperature is 97.7 F (36.5 C). Her blood pressure is 133/82 and her pulse is 79. Her respiration is 18 and oxygen saturation is 100%. .    Satisfactory skin healing in radiotherapy fields. Mild dryness and hyperpigmentation remains in patches.   Lab Findings: Lab Results  Component Value Date   WBC 1.9 (L) 09/25/2018   HGB 11.2 (L) 09/25/2018   HCT 35.0 (L) 09/25/2018   MCV 90.2 09/25/2018   PLT 175 09/25/2018    Radiographic Findings: No results found.  Impression/Plan: Healing well from radiotherapy to the breast tissue.  Continue skin care with topical Vitamin E Oil and / or  lotion for at least 2 more months for further healing.  I encouraged her to continue with yearly mammography and followup with medical oncology. I will see her back on an as-needed basis. I have encouraged her to call if she has any issues or concerns in the future. I wished her the very best.  We discussed measures to reduce the risk of infection during the COVID-19 pandemic.     [name], MD  This document serves as a record of services personally performed by [name], MD. It was created on her behalf by Steva Colder, a trained medical scribe. The creation of this record is based on the scribe's personal observations and the provider's statements to them. This document has been checked and approved by the attending provider.

## 2018-10-05 NOTE — Telephone Encounter (Signed)
Ok to stay off the metformin  Ok to restart the trulicity  Ok to increase the glipizide ER to 10 mg per day  Ok to start jardiance 25 qd  Ok for referral to DM education - done

## 2018-10-05 NOTE — Telephone Encounter (Signed)
Returned pt's call, LVM. 

## 2018-10-05 NOTE — Addendum Note (Signed)
Addended by: Biagio Borg on: 10/05/2018 11:59 AM   Modules accepted: Orders

## 2018-10-06 ENCOUNTER — Encounter: Payer: Self-pay | Admitting: General Practice

## 2018-10-06 ENCOUNTER — Other Ambulatory Visit: Payer: Self-pay

## 2018-10-06 ENCOUNTER — Encounter: Payer: Self-pay | Admitting: Radiation Oncology

## 2018-10-06 ENCOUNTER — Ambulatory Visit
Admission: RE | Admit: 2018-10-06 | Discharge: 2018-10-06 | Disposition: A | Discharge status: Home / Self Care | Payer: BC Managed Care – PPO | Source: Ambulatory Visit | Attending: Radiation Oncology | Admitting: Radiation Oncology

## 2018-10-06 ENCOUNTER — Inpatient Hospital Stay: Payer: BC Managed Care – PPO | Admitting: General Practice

## 2018-10-06 VITALS — BP 133/82 | HR 79 | Temp 97.7°F | Resp 18 | Ht 62.0 in | Wt 252.0 lb

## 2018-10-06 DIAGNOSIS — Z79810 Long term (current) use of selective estrogen receptor modulators (SERMs): Secondary | ICD-10-CM | POA: Insufficient documentation

## 2018-10-06 DIAGNOSIS — Z170 Estrogen receptor positive status [ER+]: Secondary | ICD-10-CM | POA: Diagnosis not present

## 2018-10-06 DIAGNOSIS — Z923 Personal history of irradiation: Secondary | ICD-10-CM | POA: Insufficient documentation

## 2018-10-06 DIAGNOSIS — Z79899 Other long term (current) drug therapy: Secondary | ICD-10-CM | POA: Insufficient documentation

## 2018-10-06 DIAGNOSIS — C50411 Malignant neoplasm of upper-outer quadrant of right female breast: Secondary | ICD-10-CM | POA: Diagnosis not present

## 2018-10-06 DIAGNOSIS — Z7982 Long term (current) use of aspirin: Secondary | ICD-10-CM | POA: Diagnosis not present

## 2018-10-06 HISTORY — DX: Personal history of irradiation: Z92.3

## 2018-10-06 NOTE — Progress Notes (Signed)
[facility] CSW Progress Note  Met w patient to continue work on applications for financial assistance.  Pt provided employment verification letter, [employer] application can be copied and mailed to agency.  [facility] [employer] [facility] application completed and submitted.  [employer] [facility] [employer] applications already submitted last week.    Anne Cunningham, LCSW Clinical Social Worker Phone:  [phone]  

## 2018-10-06 NOTE — Progress Notes (Addendum)
Pt here today for a follow-up appointment. Pt states that she has some pain from surgery. Pt denies having fatigue. Pt states that she is still using radiaplex and neosporin on her skin.   BP 133/82 (BP Location: Left Arm, Patient Position: Sitting)   Pulse 79   Temp 97.7 F (36.5 C) (Oral)   Resp 18   Ht 5\' 2"  (1.575 m)   Wt 252 lb (114.3 kg)   LMP 06/21/2010   SpO2 100%   BMI 46.09 kg/m    Wt Readings from Last 3 Encounters:  10/06/18 252 lb (114.3 kg)  10/03/18 254 lb (115.2 kg)  09/26/18 253 lb 3.2 oz (114.9 kg)

## 2018-10-10 ENCOUNTER — Other Ambulatory Visit: Payer: Self-pay | Admitting: Oncology

## 2018-10-16 ENCOUNTER — Other Ambulatory Visit: Payer: BC Managed Care – PPO

## 2018-10-16 ENCOUNTER — Inpatient Hospital Stay: Payer: BC Managed Care – PPO

## 2018-10-23 ENCOUNTER — Telehealth: Payer: Self-pay | Admitting: *Deleted

## 2018-10-23 NOTE — Telephone Encounter (Signed)
Medical records faxed to [facility] [place] [phone]

## 2018-10-26 ENCOUNTER — Encounter: Payer: Self-pay | Admitting: General Practice

## 2018-10-26 NOTE — Progress Notes (Signed)
[facility] CSW Progress Note  Patient brought copy of letter from [employer] [place] - she has been approved for $2500 in assistance w co pays and co insurance.  Must submit bills directly to [employer] [facility] [place] for payment, bills must be submitted by 04/22/2019.  Edwyna Shell, LCSW Clinical Social Worker Phone:  [phone]

## 2018-11-02 ENCOUNTER — Other Ambulatory Visit: Payer: Self-pay | Admitting: Women's Health

## 2018-11-02 ENCOUNTER — Other Ambulatory Visit: Payer: Self-pay

## 2018-11-02 DIAGNOSIS — R300 Dysuria: Secondary | ICD-10-CM

## 2018-11-02 NOTE — Telephone Encounter (Signed)
Patient called c/o vaginal itching.

## 2018-11-02 NOTE — Telephone Encounter (Signed)
[place] for refill, OV if no relief

## 2018-11-06 ENCOUNTER — Ambulatory Visit: Payer: BC Managed Care – PPO | Admitting: Oncology

## 2018-11-06 ENCOUNTER — Other Ambulatory Visit: Payer: Self-pay

## 2018-11-06 ENCOUNTER — Other Ambulatory Visit: Payer: BC Managed Care – PPO

## 2018-11-06 ENCOUNTER — Encounter: Payer: Self-pay | Admitting: Women's Health

## 2018-11-06 ENCOUNTER — Ambulatory Visit: Payer: BC Managed Care – PPO

## 2018-11-06 ENCOUNTER — Ambulatory Visit: Payer: BC Managed Care – PPO | Admitting: Women's Health

## 2018-11-06 VITALS — BP 124/84

## 2018-11-06 DIAGNOSIS — Z113 Encounter for screening for infections with a predominantly sexual mode of transmission: Secondary | ICD-10-CM

## 2018-11-06 DIAGNOSIS — N898 Other specified noninflammatory disorders of vagina: Secondary | ICD-10-CM

## 2018-11-06 DIAGNOSIS — A5901 Trichomonal vulvovaginitis: Secondary | ICD-10-CM | POA: Diagnosis not present

## 2018-11-06 DIAGNOSIS — R350 Frequency of micturition: Secondary | ICD-10-CM | POA: Diagnosis not present

## 2018-11-06 DIAGNOSIS — N3000 Acute cystitis without hematuria: Secondary | ICD-10-CM

## 2018-11-06 LAB — WET PREP FOR TRICH, YEAST, CLUE

## 2018-11-06 MED ORDER — SULFAMETHOXAZOLE-TRIMETHOPRIM 800-160 MG PO TABS: 1.0000 | ORAL_TABLET | Freq: Two times a day (BID) | ORAL | 0 refills | Status: DC

## 2018-11-06 MED ORDER — FLUCONAZOLE 150 MG PO TABS: 150.0000 mg | ORAL_TABLET | Freq: Once | ORAL | 0 refills | Status: AC

## 2018-11-06 MED ORDER — METRONIDAZOLE 500 MG PO TABS: 500.0000 mg | ORAL_TABLET | Freq: Two times a day (BID) | ORAL | 0 refills | Status: DC

## 2018-11-06 NOTE — Addendum Note (Signed)
Addended by: [name] on: 11/06/2018 11:58 AM   Modules accepted: Orders

## 2018-11-06 NOTE — Patient Instructions (Addendum)
Bacterial Vaginosis  Bacterial vaginosis is an infection of the vagina. It happens when too many normal germs (healthy bacteria) grow in the vagina. This infection puts you at risk for infections from sex (STIs). Treating this infection can lower your risk for some STIs. You should also treat this if you are pregnant. It can cause your baby to be born early. Follow these instructions at home: Medicines  Take over-the-counter and prescription medicines only as told by your doctor.  Take or use your antibiotic medicine as told by your doctor. Do not stop taking or using it even if you start to feel better. General instructions  If you your sexual partner is a woman, tell her that you have this infection. She needs to get treatment if she has symptoms. If you have a female partner, he does not need to be treated.  During treatment: ? Avoid sex. ? Do not douche. ? Avoid alcohol as told. ? Avoid breastfeeding as told.  Drink enough fluid to keep your pee (urine) clear or pale yellow.  Keep your vagina and butt (rectum) clean. ? Wash the area with warm water every day. ? Wipe from front to back after you use the toilet.  Keep all follow-up visits as told by your doctor. This is important. Preventing this condition  Do not douche.  Use only warm water to wash around your vagina.  Use protection when you have sex. This includes: ? Latex condoms. ? Dental dams.  Limit how many people you have sex with. It is best to only have sex with the same person (be monogamous).  Get tested for STIs. Have your partner get tested.  Wear underwear that is cotton or lined with cotton.  Avoid tight pants and pantyhose. This is most important in summer.  Do not use any products that have nicotine or tobacco in them. These include cigarettes and e-cigarettes. If you need help quitting, ask your doctor.  Do not use illegal drugs.  Limit how much alcohol you drink. Contact a doctor if:  Your  symptoms do not get better, even after you are treated.  You have more discharge or pain when you pee (urinate).  You have a fever.  You have pain in your belly (abdomen).  You have pain with sex.  Your bleed from your vagina between periods. Summary  This infection happens when too many germs (bacteria) grow in the vagina.  Treating this condition can lower your risk for some infections from sex (STIs).  You should also treat this if you are pregnant. It can cause early (premature) birth.  Do not stop taking or using your antibiotic medicine even if you start to feel better. This information is not intended to replace advice given to you by your health care provider. Make sure you discuss any questions you have with your health care provider. Document Released: 04/20/2008 Document Revised: 03/27/2016 Document Reviewed: 03/27/2016 Elsevier Interactive Patient Education  2019 [employer].  Trichomoniasis Trichomoniasis is an STI (sexually transmitted infection) that can affect both women and men. In women, the outer area of the female genitalia (vulva) and the vagina are affected. In men, the penis is mainly affected, but the prostate and other reproductive organs can also be involved. This condition can be treated with medicine. It often has no symptoms (is asymptomatic), especially in men. What are the causes? This condition is caused by an organism called Trichomonas vaginalis. Trichomoniasis most often spreads from person to person (is contagious) through sexual  contact. What increases the risk? The following factors may make you more likely to develop this condition:  Having unprotected sexual intercourse.  Having sexual intercourse with a partner who has trichomoniasis.  Having multiple sexual partners.  Having had previous trichomoniasis infections or other STIs. What are the signs or symptoms? In women, symptoms of trichomoniasis include:  Abnormal vaginal discharge  that is clear, white, gray, or yellow-green and foamy and has an unusual "fishy" odor.  Itching and irritation of the vagina and vulva.  Burning or pain during urination or sexual intercourse.  Genital redness and swelling. In men, symptoms of trichomoniasis include:  Penile discharge that may be foamy or contain pus.  Pain in the penis. This may happen only when urinating.  Itching or irritation inside the penis.  Burning after urination or ejaculation. How is this diagnosed? In women, this condition may be found during a routine Pap test or physical exam. It may be found in men during a routine physical exam. Your health care provider may perform tests to help diagnose this infection, such as:  Urine tests (men and women).  The following in women: ? Testing the pH of the vagina. ? A vaginal swab test that checks for the Trichomonas vaginalis organism. ? Testing vaginal secretions. Your health care provider may test you for other STIs, including HIV (human immunodeficiency virus). How is this treated? This condition is treated with medicine taken by mouth (orally), such as metronidazole or tinidazole to fight the infection. Your sexual partner(s) may also need to be tested and treated.  If you are a woman and you plan to become pregnant or think you may be pregnant, tell your health care provider right away. Some medicines that are used to treat the infection should not be taken during pregnancy. Your health care provider may recommend over-the-counter medicines or creams to help relieve itching or irritation. You may be tested for infection again 3 months after treatment. Follow these instructions at home:  Take and use over-the-counter and prescription medicines, including creams, only as told by your health care provider.  Do not have sexual intercourse until one week after you finish your medicine, or until your health care provider approves. Ask your health care provider when  you may resume sexual intercourse.  (Women) Do not douche or wear tampons while you have the infection.  Discuss your infection with your sexual partner(s). Make sure that your partner gets tested and treated, if necessary.  Keep all follow-up visits as told by your health care provider. This is important. How is this prevented?  Use condoms every time you have sex. Using condoms correctly and consistently can help protect against STIs.  Avoid having multiple sexual partners.  Talk with your sexual partner about any symptoms that either of you may have, as well as any history of STIs.  Get tested for STIs and STDs (sexually transmitted diseases) before you have sex. Ask your partner to do the same.  Do not have sexual contact if you have symptoms of trichomoniasis or another STI. Contact a health care provider if:  You still have symptoms after you finish your medicine.  You develop pain in your abdomen.  You have pain when you urinate.  You have bleeding after sexual intercourse.  You develop a rash.  You feel nauseous or you vomit.  You plan to become pregnant or think you may be pregnant. Summary  Trichomoniasis is an STI (sexually transmitted infection) that can affect both women and  men.  This condition often has no symptoms (is asymptomatic), especially in men.  You should not have sexual intercourse until one week after you finish your medicine, or until your health care provider approves. Ask your health care provider when you may resume sexual intercourse.  Discuss your infection with your sexual partner. Make sure that your partner gets tested and treated, if necessary. This information is not intended to replace advice given to you by your health care provider. Make sure you discuss any questions you have with your health care provider. Document Released: 01/05/2001 Document Revised: 06/04/2016 Document Reviewed: 06/04/2016 Elsevier Interactive Patient Education   2019 [employer].

## 2018-11-06 NOTE — Progress Notes (Signed)
48 year old DBF G7, P5 presents with complaint of urinary frequency, urgency, leakage of urine and vaginal discharge with irritation and odor for the past week.  Was treated with Diflucan last week with minimal relief.  Has also used over-the-counter Monistat which caused increased vaginal burning.  Sexually active 1 month ago x1 with condom, new partner.  Denies pain or burning with urination, pain at end of stream, abdominal/back pain or fever. 2011 TAH for fibroids.  Numerous medical problems including hypertension, diabetes poor control, HIV levels undetectable, HSV, 12/2017 right breast cancer is due to begin back on chemo in May.  Currently on short-term disability, school bus driver.  Exam: Appears well.  No CVAT.  Abdomen obese, nontender, external genitalia extremely erythematous, speculum exam moderate amount of a yellow discharge with odor, vaginal walls erythematous, wet prep positive for trichomonas, clues, TNTC bacteria.  GC/chlamydia culture from urethra. UA: Trace ketones, +1 glucose, +1 leukocytes, 20-40 WBCs, 6-10 squamous epithelials, many bacteria, moderate trichomonas, moderate clue cells  Trichomonal vaginitis Questionable UTI TAH STD screen Medical problems include hypertension, diabetes, HIV, 12/2017 right breast cancer/treatment  Plan: Flagyl 4 g p.o. today, alcohol precautions reviewed.  Inform ex partner for treatment.  Abstain.  Continue condoms when sexually active.  Diflucan 150 mg if continued itching persists.  Urine culture pending.  Prescription for Septra twice daily for 3 days given will await culture results or if urinary incontinence/frequency/urgency persists.  UTI prevention discussed.  Reviewed importance of adequate fluids and low sugar/carb diet.

## 2018-11-07 LAB — C. TRACHOMATIS/N. GONORRHOEAE RNA
C. trachomatis RNA, TMA: NOT DETECTED
N. gonorrhoeae RNA, TMA: NOT DETECTED

## 2018-11-08 LAB — URINE CULTURE
MICRO NUMBER:: 394123
SPECIMEN QUALITY:: ADEQUATE

## 2018-11-08 LAB — URINALYSIS, COMPLETE W/RFL CULTURE
Bilirubin Urine: NEGATIVE
Hgb urine dipstick: NEGATIVE
Hyaline Cast: NONE SEEN /LPF
Nitrites, Initial: NEGATIVE
RBC / HPF: NONE SEEN /HPF (ref 0–2)
Specific Gravity, Urine: 1.03 (ref 1.001–1.03)
pH: 5.5 (ref 5.0–8.0)

## 2018-11-08 LAB — CULTURE INDICATED

## 2018-11-14 ENCOUNTER — Ambulatory Visit (HOSPITAL_COMMUNITY)
Admission: RE | Admit: 2018-11-14 | Discharge: 2018-11-14 | Disposition: A | Discharge status: Home / Self Care | Payer: BC Managed Care – PPO | Source: Ambulatory Visit | Attending: Internal Medicine | Admitting: Internal Medicine

## 2018-11-14 ENCOUNTER — Encounter (HOSPITAL_COMMUNITY): Payer: BC Managed Care – PPO | Admitting: Cardiology

## 2018-11-14 ENCOUNTER — Other Ambulatory Visit: Payer: Self-pay

## 2018-11-14 DIAGNOSIS — C50411 Malignant neoplasm of upper-outer quadrant of right female breast: Secondary | ICD-10-CM | POA: Diagnosis not present

## 2018-11-14 DIAGNOSIS — Z170 Estrogen receptor positive status [ER+]: Secondary | ICD-10-CM | POA: Diagnosis not present

## 2018-11-14 DIAGNOSIS — J45909 Unspecified asthma, uncomplicated: Secondary | ICD-10-CM | POA: Insufficient documentation

## 2018-11-14 DIAGNOSIS — I10 Essential (primary) hypertension: Secondary | ICD-10-CM | POA: Insufficient documentation

## 2018-11-14 DIAGNOSIS — R609 Edema, unspecified: Secondary | ICD-10-CM | POA: Insufficient documentation

## 2018-11-14 DIAGNOSIS — I429 Cardiomyopathy, unspecified: Secondary | ICD-10-CM | POA: Diagnosis not present

## 2018-11-14 DIAGNOSIS — E119 Type 2 diabetes mellitus without complications: Secondary | ICD-10-CM | POA: Insufficient documentation

## 2018-11-14 DIAGNOSIS — Z21 Asymptomatic human immunodeficiency virus [HIV] infection status: Secondary | ICD-10-CM | POA: Insufficient documentation

## 2018-11-14 NOTE — Progress Notes (Signed)
  Echocardiogram 2D Echocardiogram has been performed.  Kelly Rowe 11/14/2018, 9:50 AM

## 2018-11-15 ENCOUNTER — Telehealth (HOSPITAL_COMMUNITY): Payer: Self-pay

## 2018-11-15 DIAGNOSIS — C50911 Malignant neoplasm of unspecified site of right female breast: Secondary | ICD-10-CM

## 2018-11-15 DIAGNOSIS — C773 Secondary and unspecified malignant neoplasm of axilla and upper limb lymph nodes: Principal | ICD-10-CM

## 2018-11-15 MED ORDER — CARVEDILOL 6.25 MG PO TABS: 6.2500 mg | ORAL_TABLET | Freq: Two times a day (BID) | ORAL | 3 refills | Status: DC

## 2018-11-15 NOTE — Telephone Encounter (Signed)
Left VM to return call 

## 2018-11-15 NOTE — Telephone Encounter (Signed)
Pt aware and voiced understanding Order placed for echo and new dose of coreg

## 2018-11-15 NOTE — Telephone Encounter (Signed)
-----   Message from Larey Dresser, MD sent at 11/14/2018  9:30 PM EDT ----- Some improvement from prior echo.  EF not normal but higher (50-55%) and strain within normal range.  Would increase her Coreg to 6.25 mg bid and think she can be rechallenged with Herceptin but would repeat echo at 2 months.

## 2018-11-21 ENCOUNTER — Encounter (HOSPITAL_COMMUNITY): Payer: Self-pay

## 2018-11-24 NOTE — Progress Notes (Signed)
[place]  [phone] [phone]    ID: Kelly Rowe DOB: 12/25/1970  MR#: [id number]  QBV#:[id number]  Patient Care Team: Biagio Borg, MD as PCP - [name], MD as Consulting Physician (General Surgery) [name], [name], MD as Consulting Physician (Oncology) [name], MD as Attending Physician (Radiation Oncology) [name], NP as Nurse Practitioner (Obstetrics and Gynecology) Marlinda Mike, PA-C as Referring Physician (Physician Assistant) OTHER MD:   CHIEF COMPLAINT: Triple positive breast cancer  CURRENT TREATMENT: Trastuzumab, tamoxifen   INTERVAL HISTORY: Kelly Rowe returns today for follow-up and treatment of her triple positive breast cancer.   She continues on trastuzumab. Her last dose was in February 2020. She is due for a dose today.  She also continues on tamoxifen, with okay tolerance. She reports daily hot flashes.  Kelly Rowe's last echocardiogram on 11/14/2018 showed an ejection fraction of 50-55%. Her Coreg was increased to 6.25 twice daily.  Kelly Rowe has brought up some FMLA papers which we have completed and signed and given back to her today.   REVIEW OF SYSTEMS: Kelly Rowe reports doing well overall. She states recent issues with her eyes related to her diabetes. She reports this improved with more exercise and cutting sugar out of her diet. She notes she is walking with her mother for exercise. The patient denies unusual headaches, visual changes, nausea, vomiting, stiff neck, dizziness, or gait imbalance. There has been no cough, phlegm production, or pleurisy, no chest pain or pressure, and no change in bowel or bladder habits. The patient denies fever, rash, bleeding, unexplained fatigue or unexplained weight loss. A detailed review of systems was otherwise entirely negative.   HISTORY OF CURRENT ILLNESS: From the original intake note:  "Kelly Rowe" noticed bruising in her lateral right  breast and palpated a mass  on 12/23/2017. She followed up with her gynecologist. She underwent unilateral right diagnostic mammography with tomography and right breast ultrasonography at [facility] on 01/05/2018 showing: breast density category B. There is an irregular highly suspicious mass within the right breast at the 9:30 o'clock upper outer quadrant, measuring 1.8 x 1.1 x 1.5 cm, and located 18 cm from the nipple,  Ultrasonography revealed a single morphologically abnormal lymph node in the RIGHT axilla, with cortical thickness of 6 mm.   Accordingly on 01/11/2018 she proceeded to biopsy of the right breast area in question as well as a suspicious lymph node. The pathology from this procedure showed ([id number]): Invasive ductal carcinoma grade III.  The lymph node biopsied was negative for carcinoma (concordant).. Prognostic indicators significant for: estrogen receptor, 60% positive with weak staining intensity and progesterone receptor, 10% positive with strong staining intensity. Proliferation marker Ki67 at 30%. HER2 amplified with ratios HER2/CEP17 signals 2.26 and average HER2 copies per cell 3.50  The patient's subsequent history is as detailed below.   PAST MEDICAL HISTORY: Past Medical History:  Diagnosis Date   ANEMIA-IRON DEFICIENCY 01/30/2010   ANXIETY 11/27/2007   Arthritis    Asthma 02/25/2011   Cancer ([place])    breast   Carbuncle and furuncle of trunk 04/16/2010   Chlamydia infection 03/21/2008   DIABETES MELLITUS, TYPE II 08/02/2007   Edema 01/30/2010   ELEVATED BLOOD PRESSURE WITHOUT DIAGNOSIS OF HYPERTENSION 08/02/2007   Family history of breast cancer    Family history of colon cancer    Family history of lung cancer    FREQUENCY, URINARY 05/19/2009   GENITAL HERPES 03/12/2009   GERD (gastroesophageal reflux disease)  History of kidney stones    History of radiation therapy 07/27/18- 09/06/18   Right Breast, Right Axillary and Supraclavicular nodes 50  Gy in 25 fractions, Right Breast Boost 10 Gy in 5 fractions   HIV (human immunodeficiency virus infection) ([place]) 2009   HIV INFECTION 03/12/2009   HSV (herpes simplex virus) infection    HYPERLIPIDEMIA 08/02/2007   Hypertension    Metrorrhagia 03/21/2008   PONV (postoperative nausea and vomiting)    woke up crying per patient    RETENTION, URINE 05/19/2009   Trichomonas infection 01/19/2010   VITAMIN D DEFICIENCY 01/30/2010    PAST SURGICAL HISTORY: Past Surgical History:  Procedure Laterality Date   ABDOMINAL HYSTERECTOMY  07/22/2010   TAH WITH PRESERVATION OF BOTH TUBES AND OVARIES   BREAST LUMPECTOMY WITH RADIOACTIVE SEED AND SENTINEL LYMPH NODE BIOPSY Right 02/23/2018   Procedure: BREAST LUMPECTOMY WITH RADIOACTIVE SEED AND SENTINEL LYMPH NODE BIOPSY;  Surgeon: Stark Klein, MD;  Location: [facility];  Service: General;  Laterality: Right;   CHOLECYSTECTOMY     ENDOMETRIAL ABLATION  01/11/2008   HER OPTION   ESOPHAGOGASTRODUODENOSCOPY     MULTIPLE TOOTH EXTRACTIONS     PORTACATH PLACEMENT Left 02/23/2018   Procedure: INSERTION PORT-A-CATH;  Surgeon: Stark Klein, MD;  Location: [facility];  Service: General;  Laterality: Left;   PORTACATH PLACEMENT N/A 04/21/2018   Procedure: PORT-A-CATH REVISION;  Surgeon: Stark Klein, MD;  Location: [facility];  Service: General;  Laterality: N/A;   TUBAL LIGATION     WISDOM TOOTH EXTRACTION      FAMILY HISTORY: Family History  Problem Relation Age of Onset   Prostate cancer Other    Heart disease Other    Stroke Other    Diabetes Mother    Hypertension Mother    Diabetes Father    Cancer Father        COLON and LU NG   Stroke Paternal Uncle    Breast cancer Paternal Aunt    Lung cancer Maternal Grandmother 45       Mesothelioma   Colon cancer Paternal Grandfather        dx over 38s   Lung cancer Paternal Aunt    Breast cancer Paternal Aunt    Breast cancer Paternal Aunt    Heart attack Maternal Grandfather 71    Colon cancer Other        MGM's 5 brothers   The patient's father died at age 49 due to metastatic liver cancer. The patient's mother is alive at 5. The patient's has 1 brother and 3 sisters. There was a paternal grandfather with colon cancer. There were 3 paternal aunts with breast cancer, 2 diagnosed in the 33's and 1 at age 55. There was a maternal grandmother with mesothelioma. The patient otherwise denies a history of ovarian cancer in the family.    GYNECOLOGIC HISTORY:  Patient's last menstrual period was 06/21/2010. Menarche: 48 years old Age at first live birth: 48 years old She is GXP5. Her LMP was December 2011. She is status post partial hysterectomy without oophorectomy. She took oral contraception for 3 years with no complications. She never took HRT.    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  At home are 2 of her sons, Glendell Docker and [name], and the patient's grandson, Amador Cunas 53 (who is Willie's son). The patient's oldest is [place] age 4 who lives in [place] as a cook. Daughter, [place] age 77 works as a Scientist, water quality. Son, Glendell Docker  age 56 is disabled. Son, [name] age 5 lives in [place] as a Microbiologist. Daughter, Benard Rink age 68 also is a Microbiologist. The patient has 5 grandchildren and no great grandchildren. The patient does not belong to a church.    ADVANCED DIRECTIVES: Not in place; at the 01/18/2018 visit the patient was given the appropriate documents to complete on notarized at her discretion   HEALTH MAINTENANCE: Social History   Tobacco Use   Smoking status: Never Smoker   Smokeless tobacco: Never Used  Substance Use Topics   Alcohol use: Never    Alcohol/week: 0.0 standard drinks    Frequency: Never   Drug use: No     Colonoscopy: Not yet  PAP: November 2018  Bone density: Never   Allergies  Allergen Reactions   Metformin And Related Other (See Comments)    Bowel frequency   Levaquin [Levofloxacin In D5w] Nausea And Vomiting and  Rash   Penicillins Swelling and Rash    Has patient had a PCN reaction causing immediate rash, facial/tongue/throat swelling, SOB or lightheadedness with hypotension: Yes Has patient had a PCN reaction causing severe rash involving mucus membranes or skin necrosis: No Has patient had a PCN reaction that required hospitalization: Yes Has patient had a PCN reaction occurring within the last 10 years: No If all of the above answers are "NO", then may proceed with Cephalosporin use.    Current Outpatient Medications  Medication Sig Dispense Refill   albuterol (PROVENTIL HFA;VENTOLIN HFA) 108 (90 Base) MCG/ACT inhaler Inhale 2 puffs into the lungs every 6 (six) hours as needed for wheezing or shortness of breath.     aspirin 81 MG tablet Take 81 mg by mouth daily as needed (chest pain).      Azelastine-Fluticasone (DYMISTA) 137-50 MCG/ACT SUSP Use as directed 1 spray each side twice per day as needed 23 g 5   Beclomethasone Dipropionate (QNASL) 80 MCG/ACT AERS Place 2 sprays into the nose daily. 1 Inhaler 1   bictegravir-emtricitabine-tenofovir AF (BIKTARVY) 50-200-25 MG TABS tablet Take 1 tablet by mouth daily.     blood glucose meter kit and supplies Dispense based on patient and insurance preference. Use up to four times daily as directed. (FOR ICD-10 E10.9, E11.9). 1 each 0   carvedilol (COREG) 6.25 MG tablet Take 1 tablet (6.25 mg total) by mouth 2 (two) times daily. 60 tablet 3   Diclofenac Sodium (PENNSAID) 2 % SOLN Place 1 application onto the skin 2 (two) times daily. (Patient taking differently: Place 1 application onto the skin 2 (two) times daily as needed. ) 1 Bottle 3   Dulaglutide (TRULICITY) 1.5 KG/4.0NU SOPN 0.5 ml weekly SQ 6 mL 3   empagliflozin (JARDIANCE) 25 MG TABS tablet Take 25 mg by mouth daily. (Patient not taking: Reported on 11/06/2018) 90 tablet 3   famotidine (PEPCID) 20 MG tablet Take 1 tablet (20 mg total) by mouth 2 (two) times daily. (Patient taking  differently: Take 20 mg by mouth daily as needed for heartburn. ) 60 tablet 3   ferrous gluconate (IRON 27) 240 (27 FE) MG tablet Take 240 mg by mouth daily as needed (iron).      gabapentin (NEURONTIN) 300 MG capsule Take 1 capsule (300 mg total) by mouth at bedtime. 90 capsule 4   glipiZIDE (GLUCOTROL XL) 10 MG 24 hr tablet Take 1 tablet (10 mg total) by mouth daily with breakfast. 90 tablet 3   ibuprofen (ADVIL,MOTRIN) 800 MG tablet Take 1 tablet (800 mg total) by  mouth every 8 (eight) hours as needed. (Patient taking differently: Take 800 mg by mouth daily as needed for moderate pain. ) 60 tablet 0   lidocaine-prilocaine (EMLA) cream Apply to affected area once (Patient taking differently: Apply 1 application topically once. Apply to affected area once) 30 g 3   loratadine (CLARITIN) 10 MG tablet Take 10 mg by mouth daily.     LORazepam (ATIVAN) 0.5 MG tablet Take 1 tablet (0.5 mg total) by mouth at bedtime as needed (Nausea or vomiting). 30 tablet 0   losartan (COZAAR) 25 MG tablet Take 1 tablet (25 mg total) by mouth daily. 30 tablet 3   metroNIDAZOLE (FLAGYL) 500 MG tablet Take 1 tablet (500 mg total) by mouth 2 (two) times daily. 14 tablet 0   Multiple Vitamin (MULTIVITAMIN WITH MINERALS) TABS tablet Take 1 tablet by mouth daily.     omeprazole (PRILOSEC) 40 MG capsule Take 1 capsule (40 mg total) by mouth daily. 30 capsule 0   ondansetron (ZOFRAN) 8 MG tablet Take 1 tablet (8 mg total) by mouth every 8 (eight) hours as needed for nausea or vomiting. 20 tablet 0   oxyCODONE (OXY IR/ROXICODONE) 5 MG immediate release tablet Take 1-2 tablets (5-10 mg total) by mouth every 6 (six) hours as needed for moderate pain, severe pain or breakthrough pain. (Patient not taking: Reported on 11/06/2018) 30 tablet 0   prochlorperazine (COMPAZINE) 10 MG tablet Take 1 tablet (10 mg total) by mouth every 6 (six) hours as needed (Nausea or vomiting). 30 tablet 1   silver sulfADIAZINE (SILVADENE) 1  % cream Apply 1 application topically 2 (two) times daily. 400 g 1   sulfamethoxazole-trimethoprim (BACTRIM DS) 800-160 MG tablet Take 1 tablet by mouth 2 (two) times daily. 6 tablet 0   valACYclovir (VALTREX) 500 MG tablet Take twice daily for 3-5 days 30 tablet 12   venlafaxine XR (EFFEXOR-XR) 37.5 MG 24 hr capsule Take 1 capsule (37.5 mg total) by mouth daily with breakfast. 90 capsule 4   zolpidem (AMBIEN) 10 MG tablet Take 1 tablet (10 mg total) by mouth at bedtime as needed for sleep. 90 tablet 1   No current facility-administered medications for this visit.    Facility-Administered Medications Ordered in Other Visits  Medication Dose Route Frequency Provider Last Rate Last Dose   sodium chloride 0.9 % 1,000 mL with potassium chloride 10 mEq infusion   Intravenous Once Lavergne Hiltunen, [name], MD       sodium chloride flush (NS) 0.9 % injection 10 mL  10 mL Intracatheter PRN Shemeka Wardle, [name], MD   10 mL at 09/04/18 1425    OBJECTIVE: Morbidly obese African-American woman in no acute distress  There were no vitals filed for this visit.   There is no height or weight on file to calculate BMI.   Wt Readings from Last 3 Encounters:  10/06/18 252 lb (114.3 kg)  10/03/18 254 lb (115.2 kg)  09/26/18 253 lb 3.2 oz (114.9 kg)  ECOG FS: 2 - Symptomatic, <50% confined to bed  Sclerae unicteric, EOMs intact Wearing a mask No cervical or supraclavicular adenopathy Lungs no rales or rhonchi Heart regular rate and rhythm Abd soft, nontender, positive bowel sounds MSK no focal spinal tenderness, no upper extremity lymphedema Neuro: nonfocal, well oriented, appropriate affect Breasts: The right breast is status post lumpectomy and radiation.  It is slightly larger than the left, slightly darker, and there is some coarsening of the skin.  There is no evidence of disease  recurrence.  The left breast is benign.  Both axillae are benign.     LAB RESULTS:  CMP     Component Value Date/Time    NA 135 09/25/2018 1041   K 3.6 09/25/2018 1041   CL 96 (L) 09/25/2018 1041   CO2 28 09/25/2018 1041   GLUCOSE 415 (H) 09/25/2018 1041   BUN 11 09/25/2018 1041   CREATININE 0.94 09/25/2018 1041   CALCIUM 8.8 (L) 09/25/2018 1041   PROT 7.4 09/25/2018 1041   ALBUMIN 3.5 09/25/2018 1041   AST 20 09/25/2018 1041   ALT 24 09/25/2018 1041   ALKPHOS 199 (H) 09/25/2018 1041   BILITOT 0.3 09/25/2018 1041   GFRNONAA >60 09/25/2018 1041   GFRAA >60 09/25/2018 1041    No results found for: TOTALPROTELP, ALBUMINELP, A1GS, A2GS, BETS, BETA2SER, GAMS, MSPIKE, SPEI  No results found for: KPAFRELGTCHN, LAMBDASER, KAPLAMBRATIO  Lab Results  Component Value Date   WBC 2.9 (L) 11/27/2018   NEUTROABS 1.8 11/27/2018   HGB 12.6 11/27/2018   HCT 41.1 11/27/2018   MCV 90.5 11/27/2018   PLT 213 11/27/2018    @LASTCHEMISTRY @  No results found for: LABCA2  No components found for: OTLXBW620  No results for input(s): INR in the last 168 hours.  No results found for: LABCA2  No results found for: BTD974  No results found for: BUL845  No results found for: XMI680  No results found for: CA2729  No components found for: HGQUANT  No results found for: CEA1 / No results found for: CEA1   No results found for: AFPTUMOR  No results found for: CHROMOGRNA  No results found for: PSA1  Appointment on 11/27/2018  Component Date Value Ref Range Status   WBC Count 11/27/2018 2.9* 4.0 - 10.5 K/uL Final   RBC 11/27/2018 4.54  3.87 - 5.11 MIL/uL Final   Hemoglobin 11/27/2018 12.6  12.0 - 15.0 g/dL Final   HCT 11/27/2018 41.1  36.0 - 46.0 % Final   MCV 11/27/2018 90.5  80.0 - 100.0 fL Final   MCH 11/27/2018 27.8  26.0 - 34.0 pg Final   MCHC 11/27/2018 30.7  30.0 - 36.0 g/dL Final   RDW 11/27/2018 14.0  11.5 - 15.5 % Final   Platelet Count 11/27/2018 213  150 - 400 K/uL Final   nRBC 11/27/2018 0.0  0.0 - 0.2 % Final   Neutrophils Relative % 11/27/2018 61  % Final   Neutro Abs  11/27/2018 1.8  1.7 - 7.7 K/uL Final   Lymphocytes Relative 11/27/2018 31  % Final   Lymphs Abs 11/27/2018 0.9  0.7 - 4.0 K/uL Final   Monocytes Relative 11/27/2018 8  % Final   Monocytes Absolute 11/27/2018 0.2  0.1 - 1.0 K/uL Final   Eosinophils Relative 11/27/2018 0  % Final   Eosinophils Absolute 11/27/2018 0.0  0.0 - 0.5 K/uL Final   Basophils Relative 11/27/2018 0  % Final   Basophils Absolute 11/27/2018 0.0  0.0 - 0.1 K/uL Final   Immature Granulocytes 11/27/2018 0  % Final   Abs Immature Granulocytes 11/27/2018 0.01  0.00 - 0.07 K/uL Final   Performed at [facility], [place] [address], [place], [place] [address]    (this displays the last labs from the last 3 days)  No results found for: TOTALPROTELP, ALBUMINELP, A1GS, A2GS, BETS, BETA2SER, GAMS, MSPIKE, SPEI (this displays SPEP labs)  No results found for: KPAFRELGTCHN, LAMBDASER, KAPLAMBRATIO (kappa/lambda light chains)  No results found for: HGBA, HGBA2QUANT,  HGBFQUANT, HGBSQUAN (Hemoglobinopathy evaluation)   No results found for: LDH  Lab Results  Component Value Date   IRON 35 (L) 01/30/2010   IRONPCTSAT 8.8 (L) 01/30/2010   (Iron and TIBC)  No results found for: FERRITIN  Urinalysis    Component Value Date/Time   COLORURINE DARK YELLOW 11/06/2018 1121   APPEARANCEUR CLOUDY (A) 11/06/2018 1121   LABSPEC 1.030 11/06/2018 1121   PHURINE 5.5 11/06/2018 1121   GLUCOSEU 1+ (A) 11/06/2018 1121   GLUCOSEU >=1000 (A) 10/03/2018 1115   HGBUR NEGATIVE 11/06/2018 1121   BILIRUBINUR NEGATIVE 10/03/2018 1115   KETONESUR TRACE (A) 11/06/2018 1121   PROTEINUR 1+ (A) 11/06/2018 1121   UROBILINOGEN 0.2 10/03/2018 1115   NITRITE NEGATIVE 10/03/2018 1115   LEUKOCYTESUR NEGATIVE 10/03/2018 1115     STUDIES: Mammography is being requested first week in August  ELIGIBLE FOR AVAILABLE RESEARCH PROTOCOL: no   ASSESSMENT: 48 y.o. [place], [name] woman status post right breast upper  outer quadrant biopsy 01/11/2018 for a clinical T1 N0, stage IA invasive ductal carcinoma, grade 3, estrogen and progesterone receptor positive, HER-2 amplified, with an MIB-1 of 30%.  (1) status post right lumpectomy and sentinel lymph node sampling 02/23/2018 for a pT2 pN1, stage IB invasive ductal carcinoma, grade 3, with close but negative margins  (2) adjuvant chemotherapy consisting of carboplatin, docetaxel, trastuzumab and Pertuzumab every 21 days x 6 given between 03/08/2018-07/05/2018 (a) pertuzumab stopped after cycle 1 due to diarrhea (b) docetaxel changed to gemcitabine after cycle 2 due to persistent hyperglycemia (c) Gemcitabine and Carboplatin dose reduced by approximately 10% due to delayed neutropenia and thrombocytopenia  (3) anti-HER-2 immunotherapy started concurrently with chemotherapy (a) baseline echocardiogram on 02/07/2018 showed an ejection fraction in the 55-60% range (b) repeat echo 06/19/2018 showed an ejection fraction of 55-60% (c) repeat echocardiogram 11/14/2018 showed an EF in the 50-55% range, with normal strain. (d) continuing trastuzumab through August 2020  (4) adjuvant radiation 07/27/2018 - 09/06/2018  Site/dose: 1. Right Breast / 50 Gy in 25 fractions    2. Right Supraclavicular nodes / 50 Gy in 25 fractions    3. Right Breast Boost / 10 Gy in 5 fractions  (5) antiestrogens to follow at the completion of local treatment  (6) genetics testing through [facility] Multi-cancer and Breast panel on 01/13/2018 showed: no deleterious mutations. The following genes were evaluated for sequence changes and exonic deletions/duplications: ALK, APC, ATM, AXIN2, BAP1, BARD1, BLM, BMPR1A, BRCA1, BRCA2, BRIP1, CASR, CDC73, CDH1, CDK4, CDKN1B, CDKN1C,CDKN2A (p14ARF), CDKN2A (p16INK4a), CEBPA, CHEK2, CTNNA1, DICER1, DIS3L2, EPCAM*, FH, FLCN, GATA2, GPC3, GREM1*, HRAS, KIT, MAX, MEN1, MET, MLH1, MSH2, MSH3, MSH6, MUTYH, NBN, NF1, NF2, PALB2, PDGFRA, PHOX2B*, PMS2,  POLD1,POLE, POT1, PRKAR1A, PTCH1, PTEN, RAD50, RAD51C, RAD51D, RB1, RECQL4, RET, RUNX1, SDHAF2, SDHB, SDHC, SDHD,SMAD4, SMARCA4, SMARCB1, SMARCE1, STK11, SUFU, TERC, TERT, TMEM127, TP53, TSC1, TSC2, VHL, WRN*, WT1.The following genes were evaluated for sequence changes only: EGFR*, HOXB13*, MITF*, NTHL1*, SDHA   PLAN: Severina is tolerating tamoxifen moderately well.  She does have significant hot flashes.  These do wake her up at night.  We are going to add gabapentin to see if that helps in that regard.  We have had to hold her trastuzumab part of the time because of heart issues.  We are resuming that today and hopefully we can get through August without further interruptions.  She will be followed closely by cardiology in the meantime  I have set her up for repeat mammography early August and to see me  late August.  At that time she should be done with trastuzumab and we can start long-term follow-up.  She is following appropriate pandemic precautions  I commended her new exercise plan.  She knows to call for any other issues that may develop before the next visit here.   Wylma Tatem, [name], MD  11/27/18 2:36 PM Medical Oncology and Hematology [facility] [address] [place], [place] [address] Tel. [phone]    Fax. [phone]   I, Wilburn Mylar, am acting as scribe for Dr. [name]. Eurydice Calixto.  I, Lurline Del MD, have reviewed the above documentation for accuracy and completeness, and I agree with the above.

## 2018-11-27 ENCOUNTER — Other Ambulatory Visit: Payer: Self-pay

## 2018-11-27 ENCOUNTER — Inpatient Hospital Stay: Payer: BC Managed Care – PPO

## 2018-11-27 ENCOUNTER — Other Ambulatory Visit: Payer: BC Managed Care – PPO

## 2018-11-27 ENCOUNTER — Other Ambulatory Visit: Payer: Self-pay | Admitting: Internal Medicine

## 2018-11-27 ENCOUNTER — Inpatient Hospital Stay: Payer: BC Managed Care – PPO | Attending: Oncology

## 2018-11-27 ENCOUNTER — Inpatient Hospital Stay (HOSPITAL_BASED_OUTPATIENT_CLINIC_OR_DEPARTMENT_OTHER): Payer: BC Managed Care – PPO | Admitting: Oncology

## 2018-11-27 ENCOUNTER — Ambulatory Visit: Payer: BC Managed Care – PPO

## 2018-11-27 VITALS — BP 141/81 | HR 84 | Temp 97.9°F | Resp 20 | Wt 248.0 lb

## 2018-11-27 DIAGNOSIS — C50911 Malignant neoplasm of unspecified site of right female breast: Secondary | ICD-10-CM

## 2018-11-27 DIAGNOSIS — C773 Secondary and unspecified malignant neoplasm of axilla and upper limb lymph nodes: Secondary | ICD-10-CM

## 2018-11-27 DIAGNOSIS — Z170 Estrogen receptor positive status [ER+]: Secondary | ICD-10-CM

## 2018-11-27 DIAGNOSIS — E1165 Type 2 diabetes mellitus with hyperglycemia: Secondary | ICD-10-CM | POA: Insufficient documentation

## 2018-11-27 DIAGNOSIS — Z79899 Other long term (current) drug therapy: Secondary | ICD-10-CM | POA: Diagnosis not present

## 2018-11-27 DIAGNOSIS — E119 Type 2 diabetes mellitus without complications: Secondary | ICD-10-CM | POA: Diagnosis not present

## 2018-11-27 DIAGNOSIS — Z95828 Presence of other vascular implants and grafts: Secondary | ICD-10-CM

## 2018-11-27 DIAGNOSIS — I10 Essential (primary) hypertension: Secondary | ICD-10-CM

## 2018-11-27 DIAGNOSIS — C50411 Malignant neoplasm of upper-outer quadrant of right female breast: Secondary | ICD-10-CM | POA: Insufficient documentation

## 2018-11-27 DIAGNOSIS — C50919 Malignant neoplasm of unspecified site of unspecified female breast: Secondary | ICD-10-CM

## 2018-11-27 DIAGNOSIS — Z5112 Encounter for antineoplastic immunotherapy: Secondary | ICD-10-CM | POA: Insufficient documentation

## 2018-11-27 DIAGNOSIS — B20 Human immunodeficiency virus [HIV] disease: Secondary | ICD-10-CM | POA: Diagnosis not present

## 2018-11-27 DIAGNOSIS — Z79810 Long term (current) use of selective estrogen receptor modulators (SERMs): Secondary | ICD-10-CM

## 2018-11-27 DIAGNOSIS — C771 Secondary and unspecified malignant neoplasm of intrathoracic lymph nodes: Principal | ICD-10-CM

## 2018-11-27 DIAGNOSIS — D701 Agranulocytosis secondary to cancer chemotherapy: Secondary | ICD-10-CM | POA: Diagnosis not present

## 2018-11-27 LAB — CBC WITH DIFFERENTIAL (CANCER CENTER ONLY)
Abs Immature Granulocytes: 0.01 10*3/uL (ref 0.00–0.07)
Basophils Absolute: 0 10*3/uL (ref 0.0–0.1)
Basophils Relative: 0 %
Eosinophils Absolute: 0 10*3/uL (ref 0.0–0.5)
Eosinophils Relative: 0 %
HCT: 41.1 % (ref 36.0–46.0)
Hemoglobin: 12.6 g/dL (ref 12.0–15.0)
Immature Granulocytes: 0 %
Lymphocytes Relative: 31 %
Lymphs Abs: 0.9 10*3/uL (ref 0.7–4.0)
MCH: 27.8 pg (ref 26.0–34.0)
MCHC: 30.7 g/dL (ref 30.0–36.0)
MCV: 90.5 fL (ref 80.0–100.0)
Monocytes Absolute: 0.2 10*3/uL (ref 0.1–1.0)
Monocytes Relative: 8 %
Neutro Abs: 1.8 10*3/uL (ref 1.7–7.7)
Neutrophils Relative %: 61 %
Platelet Count: 213 10*3/uL (ref 150–400)
RBC: 4.54 MIL/uL (ref 3.87–5.11)
RDW: 14 % (ref 11.5–15.5)
WBC Count: 2.9 10*3/uL — ABNORMAL LOW (ref 4.0–10.5)
nRBC: 0 % (ref 0.0–0.2)

## 2018-11-27 LAB — CMP (CANCER CENTER ONLY)
ALT: 22 U/L (ref 0–44)
AST: 21 U/L (ref 15–41)
Albumin: 3.4 g/dL — ABNORMAL LOW (ref 3.5–5.0)
Alkaline Phosphatase: 122 U/L (ref 38–126)
Anion gap: 12 (ref 5–15)
BUN: 9 mg/dL (ref 6–20)
CO2: 26 mmol/L (ref 22–32)
Calcium: 9.1 mg/dL (ref 8.9–10.3)
Chloride: 103 mmol/L (ref 98–111)
Creatinine: 0.96 mg/dL (ref 0.44–1.00)
GFR, Est AFR Am: >60 mL/min (ref >60)
GFR, Estimated: >60 mL/min (ref >60)
Glucose, Bld: 225 mg/dL — ABNORMAL HIGH (ref 70–99)
Potassium: 4 mmol/L (ref 3.5–5.1)
Sodium: 141 mmol/L (ref 135–145)
Total Bilirubin: 0.2 mg/dL — ABNORMAL LOW (ref 0.3–1.2)
Total Protein: 7.4 g/dL (ref 6.5–8.1)

## 2018-11-27 MED ORDER — DIPHENHYDRAMINE HCL 25 MG PO CAPS
25.0000 mg | ORAL_CAPSULE | Freq: Once | ORAL | Status: AC
  Administered 2018-11-27: 15:00:00 25 mg via ORAL

## 2018-11-27 MED ORDER — SODIUM CHLORIDE 0.9% FLUSH
10.0000 mL | Freq: Once | INTRAVENOUS | Status: AC
  Administered 2018-11-27: 10 mL
  Filled 2018-11-27: qty 10

## 2018-11-27 MED ORDER — SODIUM CHLORIDE 0.9 % IV SOLN
Freq: Once | INTRAVENOUS | Status: AC
  Administered 2018-11-27: 15:00:00 via INTRAVENOUS
  Filled 2018-11-27: qty 250

## 2018-11-27 MED ORDER — ACETAMINOPHEN 325 MG PO TABS
650.0000 mg | ORAL_TABLET | Freq: Once | ORAL | Status: AC
  Administered 2018-11-27: 650 mg via ORAL

## 2018-11-27 MED ORDER — HEPARIN SOD (PORK) LOCK FLUSH 100 UNIT/ML IV SOLN
500.0000 [IU] | Freq: Once | INTRAVENOUS | Status: AC | PRN
  Administered 2018-11-27: 16:00:00 500 [IU]
  Filled 2018-11-27: qty 5

## 2018-11-27 MED ORDER — SODIUM CHLORIDE 0.9% FLUSH
10.0000 mL | INTRAVENOUS | Status: DC | PRN
  Administered 2018-11-27: 16:00:00 10 mL
  Filled 2018-11-27: qty 10

## 2018-11-27 MED ORDER — GABAPENTIN 300 MG PO CAPS: 300.0000 mg | ORAL_CAPSULE | Freq: Every day | ORAL | 4 refills | Status: AC

## 2018-11-27 MED ORDER — DIPHENHYDRAMINE HCL 25 MG PO CAPS
ORAL_CAPSULE | ORAL | Status: AC
  Filled 2018-11-27: qty 2

## 2018-11-27 MED ORDER — ACETAMINOPHEN 325 MG PO TABS
ORAL_TABLET | ORAL | Status: AC
  Filled 2018-11-27: qty 2

## 2018-11-27 MED ORDER — TRASTUZUMAB CHEMO 150 MG IV SOLR
6.0000 mg/kg | Freq: Once | INTRAVENOUS | Status: AC
  Administered 2018-11-27: 16:00:00 714 mg via INTRAVENOUS
  Filled 2018-11-27: qty 34

## 2018-11-27 NOTE — Patient Instructions (Signed)
[facility] Discharge Instructions for Patients Receiving Chemotherapy  Today you received the following chemotherapy agents: Trastuzumab (Herceptin)  To help prevent nausea and vomiting after your treatment, we encourage you to take your nausea medication as directed.    If you develop nausea and vomiting that is not controlled by your nausea medication, call the clinic.   BELOW ARE SYMPTOMS THAT SHOULD BE REPORTED IMMEDIATELY:  *FEVER GREATER THAN 100.5 F  *CHILLS WITH OR WITHOUT FEVER  NAUSEA AND VOMITING THAT IS NOT CONTROLLED WITH YOUR NAUSEA MEDICATION  *UNUSUAL SHORTNESS OF BREATH  *UNUSUAL BRUISING OR BLEEDING  TENDERNESS IN MOUTH AND THROAT WITH OR WITHOUT PRESENCE OF ULCERS  *URINARY PROBLEMS  *BOWEL PROBLEMS  UNUSUAL RASH Items with * indicate a potential emergency and should be followed up as soon as possible.  Feel free to call the clinic should you have any questions or concerns. The clinic phone number is [phone].  Please show the CHEMO ALERT CARD at check-in to the Emergency Department and triage nurse.    

## 2018-11-28 ENCOUNTER — Telehealth: Payer: Self-pay | Admitting: Oncology

## 2018-11-28 LAB — FOLLICLE STIMULATING HORMONE: FSH: 40.7 m[IU]/mL

## 2018-11-28 NOTE — Telephone Encounter (Signed)
Tried to reach regarding schedule °

## 2018-11-29 LAB — ESTRADIOL, ULTRA SENS: Estradiol, Sensitive: 12.6 pg/mL

## 2018-12-04 ENCOUNTER — Encounter: Payer: Self-pay | Admitting: *Deleted

## 2018-12-13 ENCOUNTER — Other Ambulatory Visit: Payer: Self-pay

## 2018-12-13 ENCOUNTER — Encounter: Payer: Self-pay | Admitting: Dietician

## 2018-12-13 ENCOUNTER — Encounter: Payer: BC Managed Care – PPO | Attending: Internal Medicine | Admitting: Dietician

## 2018-12-13 VITALS — Ht 62.0 in | Wt 248.8 lb

## 2018-12-13 DIAGNOSIS — E1169 Type 2 diabetes mellitus with other specified complication: Secondary | ICD-10-CM | POA: Insufficient documentation

## 2018-12-13 NOTE — Progress Notes (Signed)
Diabetes Self-Management Education  Visit Type: First/Initial  Appt. Start Time: 2:15pm Appt. End Time: 3:15pm  12/13/2018  [name], identified by name and date of birth, is a 48 y.o. female with a diagnosis of Diabetes: Type 2.   ASSESSMENT  Height 5\' 2"  (1.575 m), weight 248 lb 12.8 oz (112.9 kg), last menstrual period 06/21/2010. Body mass index is 45.51 kg/m.   Body Composition Scale 12/13/2018  Total Body Fat % 46.8  Visceral Fat 18  Fat-Free Mass % 53.1   Total Body Water % 41   Muscle-Mass lbs 29.7  Body Fat Displacement          Torso  lbs 72.2         Left Leg  lbs 14.4         Right Leg  lbs 14.4         Left Arm  lbs 7.2         Right Arm   lbs 7.2  Anthropometrics were measured via Body Composition Scale, per pt request. Reminder to check at follow up visit as well.   Diabetes Self-Management Education - 12/13/18 1415      Visit Information   Visit Type  First/Initial      Initial Visit   Diabetes Type  Type 2    Are you currently following a meal plan?  No    Are you taking your medications as prescribed?  Yes    Date Diagnosed  September 2009      Health Coping   How would you rate your overall health?  Poor      Psychosocial Assessment   Patient Belief/Attitude about Diabetes  Other (comment)   confused   Self-care barriers  Impaired vision    Self-management support  Family;Doctor's office   daughter   Other persons present  Patient    Patient Concerns  Weight Control;Healthy Lifestyle;Other (comment)   Emotional Stress   Special Needs  None    Preferred Learning Style  No preference indicated    Learning Readiness  Contemplating    How often do you need to have someone help you when you read instructions, pamphlets, or other written materials from your doctor or pharmacy?  1 - Never    What is the last grade level you completed in school?  10th       Pre-Education Assessment   Patient understands the diabetes disease and  treatment process.  Needs Instruction    Patient understands incorporating nutritional management into lifestyle.  Needs Instruction    Patient undertands incorporating physical activity into lifestyle.  Needs Instruction    Patient understands using medications safely.  Needs Instruction    Patient understands monitoring blood glucose, interpreting and using results  Needs Instruction    Patient understands prevention, detection, and treatment of acute complications.  Needs Instruction    Patient understands prevention, detection, and treatment of chronic complications.  Needs Instruction    Patient understands how to develop strategies to address psychosocial issues.  Needs Instruction    Patient understands how to develop strategies to promote health/change behavior.  Needs Instruction      Complications   Last HgB A1C per patient/outside source  12 %    How often do you check your blood sugar?  1-2 times/day    Fasting Blood glucose range (mg/dL)  130-179    Postprandial Blood glucose range (mg/dL)  130-179;180-200    Number of hypoglycemic episodes per month  0  Number of hyperglycemic episodes per week  4    Can you tell when your blood sugar is high?  Yes    What do you do if your blood sugar is high?  drink water    Have you had a dilated eye exam in the past 12 months?  Yes    Have you had a dental exam in the past 12 months?  No    Are you checking your feet?  Yes    How many days per week are you checking your feet?  7      Dietary Intake   Breakfast  Raisin Bran Crunch + whole milk    or Honey Nut Cheerios   Snack (morning)  bananas   or fruit cocktail   Lunch  chicken or tuna sub     Snack (afternoon)  nuts    Dinner  Olive [employer] potato soup or chicken parmesan     Snack (evening)  ice cream, Oreo cookies, marshmallows    Beverage(s)  Body Armor beverages, water, whole milk, OJ      Exercise   Exercise Type  ADL's;Light (walking / raking leaves)    How many days  per week to you exercise?  3    How many minutes per day do you exercise?  30    Total minutes per week of exercise  90      Patient Education   Previous Diabetes Education  Yes (please comment)   minimal education in 2009 when diagnosed   Disease state   Definition of diabetes, type 1 and 2, and the diagnosis of diabetes;Explored patient's options for treatment of their diabetes    Nutrition management   Carbohydrate counting;Role of diet in the treatment of diabetes and the relationship between the three main macronutrients and blood glucose level;Food label reading, portion sizes and measuring food.;Reviewed blood glucose goals for pre and post meals and how to evaluate the patients' food intake on their blood glucose level.;Effects of alcohol on blood glucose and safety factors with consumption of alcohol.    Physical activity and exercise   Helped patient identify appropriate exercises in relation to his/her diabetes, diabetes complications and other health issue.;Role of exercise on diabetes management, blood pressure control and cardiac health.    Medications  Reviewed medication adjustment guidelines for hyperglycemia and sick days.;Reviewed patients medication for diabetes, action, purpose, timing of dose and side effects.    Monitoring  Taught/evaluated SMBG meter.;Taught/discussed recording of test results and interpretation of SMBG.;Identified appropriate SMBG and/or A1C goals.;Daily foot exams;Purpose and frequency of SMBG.;Yearly dilated eye exam    Acute complications  Trained/discussed glucagon administration to patient and designated other.;Discussed and identified patients' treatment of hyperglycemia.;Taught treatment of hypoglycemia - the 15 rule.;Covered sick day management with medication and food.    Personal strategies to promote health  Lifestyle issues that need to be addressed for better diabetes care   Discussed nightly binge eating episodes and how this impacts blood sugar,  disrupts sleep, and came up with alternative options for how to better manage these situations     Individualized Goals (developed by patient)   Nutrition  Follow meal plan discussed   Aim to eat vegetables at least twice daily.    Physical Activity  Exercise 3-5 times per week;30 minutes per day    Medications  take my medication as prescribed    Monitoring   test blood glucose pre and post meals as discussed    Reducing Risk  examine blood glucose patterns;get labs drawn;do foot checks daily;treat hypoglycemia with 15 grams of carbs if blood glucose less than 70mg /dL;increase portions of nuts and seeds      Post-Education Assessment   Patient understands the diabetes disease and treatment process.  Demonstrates understanding / competency    Patient understands incorporating nutritional management into lifestyle.  Needs Review    Patient undertands incorporating physical activity into lifestyle.  Needs Review    Patient understands using medications safely.  Needs Review    Patient understands monitoring blood glucose, interpreting and using results  Needs Review    Patient understands prevention, detection, and treatment of acute complications.  Needs Review    Patient understands prevention, detection, and treatment of chronic complications.  Needs Instruction    Patient understands how to develop strategies to address psychosocial issues.  Needs Instruction    Patient understands how to develop strategies to promote health/change behavior.  Needs Instruction      Outcomes   Expected Outcomes  Demonstrated interest in learning. Expect positive outcomes    Future DMSE  4-6 wks    Program Status  Not Completed       Individualized Plan for Diabetes Self-Management Training:   Learning Objective:  Patient will have a greater understanding of diabetes self-management. Patient education plan is to attend individual and/or group sessions per assessed needs and concerns.    Plan:  Patient Instructions   Ask your doctor about if having glucagon on hand is appropriate for you.   Continue taking your medications per doctor's recommendation.   Remember to check your blood sugar fasting (in the morning before eating/drinking) and 2 hours after a meal. Refer to your booklet for the ranges you want these numbers to be in.   Keep in mind your goals:   Aim to eat vegetables twice daily.   Aim to continue getting physical activity 30 minutes per day at least 3 days per week.    See you in about 4 weeks!  RD's Notes for Next Visit:   Stress and emotional eating  Meal Ideas sheet  Eating out tips    Expected Outcomes:  Demonstrated interest in learning. Expect positive outcomes  Education material provided: ADA - How to Thrive: A Guide for Your Journey with Diabetes, Snack sheet and Carbohydrate counting sheet  If problems or questions, patient to contact team via:  Phone and Email  Future DSME appointment: 4-6 wks

## 2018-12-13 NOTE — Patient Instructions (Addendum)
   Ask your doctor about if having glucagon on hand is appropriate for you.   Continue taking your medications per doctor's recommendation.   Remember to check your blood sugar fasting (in the morning before eating/drinking) and 2 hours after a meal. Refer to your booklet for the ranges you want these numbers to be in.   Keep in mind your goals:   Aim to eat vegetables twice daily.   Aim to continue getting physical activity 30 minutes per day at least 3 days per week.    See you in about 4 weeks!

## 2018-12-20 ENCOUNTER — Encounter: Payer: Self-pay | Admitting: Internal Medicine

## 2018-12-20 ENCOUNTER — Inpatient Hospital Stay: Payer: BC Managed Care – PPO

## 2018-12-20 ENCOUNTER — Other Ambulatory Visit: Payer: Self-pay

## 2018-12-20 VITALS — BP 123/77 | HR 81 | Temp 98.0°F | Resp 20

## 2018-12-20 DIAGNOSIS — C50411 Malignant neoplasm of upper-outer quadrant of right female breast: Secondary | ICD-10-CM

## 2018-12-20 DIAGNOSIS — Z170 Estrogen receptor positive status [ER+]: Secondary | ICD-10-CM

## 2018-12-20 DIAGNOSIS — C50911 Malignant neoplasm of unspecified site of right female breast: Secondary | ICD-10-CM

## 2018-12-20 DIAGNOSIS — Z95828 Presence of other vascular implants and grafts: Secondary | ICD-10-CM

## 2018-12-20 LAB — CBC WITH DIFFERENTIAL (CANCER CENTER ONLY)
Abs Immature Granulocytes: 0.02 10*3/uL (ref 0.00–0.07)
Basophils Absolute: 0 10*3/uL (ref 0.0–0.1)
Basophils Relative: 0 %
Eosinophils Absolute: 0 10*3/uL (ref 0.0–0.5)
Eosinophils Relative: 1 %
HCT: 38 % (ref 36.0–46.0)
Hemoglobin: 11.8 g/dL — ABNORMAL LOW (ref 12.0–15.0)
Immature Granulocytes: 1 %
Lymphocytes Relative: 29 %
Lymphs Abs: 0.9 10*3/uL (ref 0.7–4.0)
MCH: 27.8 pg (ref 26.0–34.0)
MCHC: 31.1 g/dL (ref 30.0–36.0)
MCV: 89.6 fL (ref 80.0–100.0)
Monocytes Absolute: 0.3 10*3/uL (ref 0.1–1.0)
Monocytes Relative: 10 %
Neutro Abs: 1.9 10*3/uL (ref 1.7–7.7)
Neutrophils Relative %: 59 %
Platelet Count: 206 10*3/uL (ref 150–400)
RBC: 4.24 MIL/uL (ref 3.87–5.11)
RDW: 14.5 % (ref 11.5–15.5)
WBC Count: 3.2 10*3/uL — ABNORMAL LOW (ref 4.0–10.5)
nRBC: 0 % (ref 0.0–0.2)

## 2018-12-20 LAB — CMP (CANCER CENTER ONLY)
ALT: 19 U/L (ref 0–44)
AST: 16 U/L (ref 15–41)
Albumin: 3.1 g/dL — ABNORMAL LOW (ref 3.5–5.0)
Alkaline Phosphatase: 116 U/L (ref 38–126)
Anion gap: 8 (ref 5–15)
BUN: 10 mg/dL (ref 6–20)
CO2: 28 mmol/L (ref 22–32)
Calcium: 8.5 mg/dL — ABNORMAL LOW (ref 8.9–10.3)
Chloride: 102 mmol/L (ref 98–111)
Creatinine: 0.84 mg/dL (ref 0.44–1.00)
GFR, Est AFR Am: >60 mL/min (ref >60)
GFR, Estimated: >60 mL/min (ref >60)
Glucose, Bld: 148 mg/dL — ABNORMAL HIGH (ref 70–99)
Potassium: 3.7 mmol/L (ref 3.5–5.1)
Sodium: 138 mmol/L (ref 135–145)
Total Bilirubin: <0.2 mg/dL — ABNORMAL LOW (ref 0.3–1.2)
Total Protein: 6.9 g/dL (ref 6.5–8.1)

## 2018-12-20 MED ORDER — ACETAMINOPHEN 325 MG PO TABS
ORAL_TABLET | ORAL | Status: AC
  Filled 2018-12-20: qty 2

## 2018-12-20 MED ORDER — DIPHENHYDRAMINE HCL 25 MG PO CAPS
25.0000 mg | ORAL_CAPSULE | Freq: Once | ORAL | Status: AC
  Administered 2018-12-20: 14:00:00 25 mg via ORAL

## 2018-12-20 MED ORDER — SODIUM CHLORIDE 0.9 % IV SOLN
Freq: Once | INTRAVENOUS | Status: AC
  Administered 2018-12-20: 14:00:00 via INTRAVENOUS
  Filled 2018-12-20: qty 250

## 2018-12-20 MED ORDER — ACETAMINOPHEN 325 MG PO TABS
650.0000 mg | ORAL_TABLET | Freq: Once | ORAL | Status: AC
  Administered 2018-12-20: 14:00:00 650 mg via ORAL

## 2018-12-20 MED ORDER — SODIUM CHLORIDE 0.9% FLUSH
10.0000 mL | INTRAVENOUS | Status: DC | PRN
  Administered 2018-12-20: 15:00:00 10 mL
  Filled 2018-12-20: qty 10

## 2018-12-20 MED ORDER — DIPHENHYDRAMINE HCL 25 MG PO CAPS
ORAL_CAPSULE | ORAL | Status: AC
  Filled 2018-12-20: qty 1

## 2018-12-20 MED ORDER — HEPARIN SOD (PORK) LOCK FLUSH 100 UNIT/ML IV SOLN
500.0000 [IU] | Freq: Once | INTRAVENOUS | Status: AC | PRN
  Administered 2018-12-20: 15:00:00 500 [IU]
  Filled 2018-12-20: qty 5

## 2018-12-20 MED ORDER — SODIUM CHLORIDE 0.9% FLUSH
10.0000 mL | Freq: Once | INTRAVENOUS | Status: AC
  Administered 2018-12-20: 10 mL
  Filled 2018-12-20: qty 10

## 2018-12-20 MED ORDER — TRASTUZUMAB CHEMO 150 MG IV SOLR
6.0000 mg/kg | Freq: Once | INTRAVENOUS | Status: AC
  Administered 2018-12-20: 15:00:00 714 mg via INTRAVENOUS
  Filled 2018-12-20: qty 34

## 2018-12-20 NOTE — Patient Instructions (Signed)
[facility] Discharge Instructions for Patients Receiving Chemotherapy  Today you received the following chemotherapy agents: Trastuzumab (Herceptin)  To help prevent nausea and vomiting after your treatment, we encourage you to take your nausea medication as directed.    If you develop nausea and vomiting that is not controlled by your nausea medication, call the clinic.   BELOW ARE SYMPTOMS THAT SHOULD BE REPORTED IMMEDIATELY:  *FEVER GREATER THAN 100.5 F  *CHILLS WITH OR WITHOUT FEVER  NAUSEA AND VOMITING THAT IS NOT CONTROLLED WITH YOUR NAUSEA MEDICATION  *UNUSUAL SHORTNESS OF BREATH  *UNUSUAL BRUISING OR BLEEDING  TENDERNESS IN MOUTH AND THROAT WITH OR WITHOUT PRESENCE OF ULCERS  *URINARY PROBLEMS  *BOWEL PROBLEMS  UNUSUAL RASH Items with * indicate a potential emergency and should be followed up as soon as possible.  Feel free to call the clinic should you have any questions or concerns. The clinic phone number is [phone].  Please show the CHEMO ALERT CARD at check-in to the Emergency Department and triage nurse.    

## 2018-12-28 ENCOUNTER — Telehealth: Payer: Self-pay | Admitting: Oncology

## 2018-12-28 NOTE — Telephone Encounter (Signed)
Printed medical records for FMLA Specialist to send with her Disability paperwork.Release [id number]

## 2019-01-03 ENCOUNTER — Encounter: Payer: Self-pay | Admitting: Rehabilitation

## 2019-01-03 ENCOUNTER — Ambulatory Visit: Payer: BC Managed Care – PPO | Attending: General Surgery | Admitting: Rehabilitation

## 2019-01-03 ENCOUNTER — Other Ambulatory Visit: Payer: Self-pay

## 2019-01-03 DIAGNOSIS — I890 Lymphedema, not elsewhere classified: Secondary | ICD-10-CM

## 2019-01-03 DIAGNOSIS — C50411 Malignant neoplasm of upper-outer quadrant of right female breast: Secondary | ICD-10-CM | POA: Diagnosis present

## 2019-01-03 DIAGNOSIS — Z483 Aftercare following surgery for neoplasm: Secondary | ICD-10-CM | POA: Diagnosis present

## 2019-01-03 DIAGNOSIS — Z170 Estrogen receptor positive status [ER+]: Secondary | ICD-10-CM | POA: Insufficient documentation

## 2019-01-03 DIAGNOSIS — M25611 Stiffness of right shoulder, not elsewhere classified: Secondary | ICD-10-CM

## 2019-01-03 DIAGNOSIS — R293 Abnormal posture: Secondary | ICD-10-CM | POA: Diagnosis present

## 2019-01-03 NOTE — Therapy (Addendum)
[place], [name], [address] Phone: [phone]   Fax:  [phone]  Physical Therapy Evaluation  Patient Details  Name: Kelly Rowe MRN: [id number] Date of Birth: [birth year] Referring Provider (PT): Dr. Barry Dienes    Encounter Date: 01/03/2019  PT End of Session - 01/03/19 1700    Visit Number  1    Number of Visits  9    Date for PT Re-Evaluation  02/07/19    PT Start Time  1600    PT Stop Time  1645    PT Time Calculation (min)  45 min    Activity Tolerance  Patient tolerated treatment well    Behavior During Therapy  [facility] for tasks assessed/performed       Past Medical History:  Diagnosis Date  . ANEMIA-IRON DEFICIENCY 01/30/2010  . ANXIETY 11/27/2007  . Arthritis   . Asthma 02/25/2011  . Cancer ([place])    breast  . Carbuncle and furuncle of trunk 04/16/2010  . Chlamydia infection 03/21/2008  . DIABETES MELLITUS, TYPE II 08/02/2007  . Edema 01/30/2010  . ELEVATED BLOOD PRESSURE WITHOUT DIAGNOSIS OF HYPERTENSION 08/02/2007  . Family history of breast cancer   . Family history of colon cancer   . Family history of lung cancer   . FREQUENCY, URINARY 05/19/2009  . GENITAL HERPES 03/12/2009  . GERD (gastroesophageal reflux disease)   . History of kidney stones   . History of radiation therapy 07/27/18- 09/06/18   Right Breast, Right Axillary and Supraclavicular nodes 50 Gy in 25 fractions, Right Breast Boost 10 Gy in 5 fractions  . HIV (human immunodeficiency virus infection) ([place]) 2009  . HIV INFECTION 03/12/2009  . HSV (herpes simplex virus) infection   . HYPERLIPIDEMIA 08/02/2007  . Hypertension   . Metrorrhagia 03/21/2008  . PONV (postoperative nausea and vomiting)    woke up crying per patient   . RETENTION, URINE 05/19/2009  . Trichomonas infection 01/19/2010  . VITAMIN D DEFICIENCY 01/30/2010    Past Surgical History:  Procedure Laterality Date  . ABDOMINAL HYSTERECTOMY  07/22/2010   TAH WITH  PRESERVATION OF BOTH TUBES AND OVARIES  . BREAST LUMPECTOMY WITH RADIOACTIVE SEED AND SENTINEL LYMPH NODE BIOPSY Right 02/23/2018   Procedure: BREAST LUMPECTOMY WITH RADIOACTIVE SEED AND SENTINEL LYMPH NODE BIOPSY;  Surgeon: Stark Klein, MD;  Location: [facility];  Service: General;  Laterality: Right;  . CHOLECYSTECTOMY    . ENDOMETRIAL ABLATION  01/11/2008   HER OPTION  . ESOPHAGOGASTRODUODENOSCOPY    . MULTIPLE TOOTH EXTRACTIONS    . PORTACATH PLACEMENT Left 02/23/2018   Procedure: INSERTION PORT-A-CATH;  Surgeon: Stark Klein, MD;  Location: [facility];  Service: General;  Laterality: Left;  . PORTACATH PLACEMENT N/A 04/21/2018   Procedure: PORT-A-CATH REVISION;  Surgeon: Stark Klein, MD;  Location: [facility];  Service: General;  Laterality: N/A;  . TUBAL LIGATION    . WISDOM TOOTH EXTRACTION      There were no vitals filed for this visit.   Subjective Assessment - 01/03/19 1605    Subjective  My breast and upper arm are swollen.  I was here before but now it is worse after radiation.  I got compression from [employer] but I just can't get it on.      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 Chemotherapy completed 07/05/18, Herceptin started with chemo and continues through August of this  year.  Radiation 07/27/18-09/06/18 to the Rt breast, Antiestrogens to follow. Other medical history to include asthma, DM, HTN, HIV, and hysterectomy    Limitations  --   putting on my bra   Patient Stated Goals  stop the pain in the arm     Currently in Pain?  Yes    Pain Score  5     Pain Location  Breast    Pain Orientation  Right    Pain Descriptors / Indicators  Burning;Aching    Pain Type  Surgical pain    Pain Onset  More than a month ago    Pain Frequency  Constant    Aggravating Factors   laying on the Rt side    Pain Relieving Factors  pillow under the arm     Multiple Pain Sites  Yes    Pain Score  8   pain only  in the Rt arm with end range movements    Pain Location  Arm    Pain Orientation  Right    Pain Descriptors / Indicators  Aching;Stabbing    Pain Type  Chronic pain    Pain Onset  More than a month ago    Pain Frequency  Intermittent    Aggravating Factors   movement    Pain Relieving Factors  rest         [facility] PT Assessment - 01/03/19 0001      Assessment   Medical Diagnosis  Rt breast cancer    Referring Provider (PT)  Dr. Barry Dienes     Onset Date/Surgical Date  02/23/18    Hand Dominance  Right    Prior Therapy  yes      Precautions   Precaution Comments  lymphedema, HIV      Restrictions   Weight Bearing Restrictions  No      Balance Screen   Has the patient fallen in the past 6 months  No    Has the patient had a decrease in activity level because of a fear of falling?   No    Is the patient reluctant to leave their home because of a fear of falling?   No      Home Social worker  Private residence    Living Arrangements  Children    Available Help at Discharge  Family    Type of [place]      Prior Function   Level of [place]  Unemployed    Leisure  walking every other day about 1 mile      Cognition   Overall Cognitive Status  Within Functional Limits for tasks assessed      Observation/Other Assessments   Observations  see photo    Skin Integrity  peau d orange skin Rt breast, skin darkened, fibrosis present more inferior breast      Sensation   Additional Comments  a little in the Rt arm       Coordination   Gross Motor Movements are Fluid and Coordinated  Yes      Posture/Postural Control   Posture/Postural Control  Postural limitations    Postural Limitations  Rounded Shoulders;Forward head      ROM / Strength   AROM / PROM / Strength  AROM;PROM      AROM   AROM Assessment Site  Shoulder    Right/Left Shoulder  Right;Left    Right Shoulder Extension  60 Degrees  Right Shoulder Flexion   150 Degrees   pn   Right Shoulder ABduction  164 Degrees   pn   Right Shoulder Internal Rotation  75 Degrees    Right Shoulder External Rotation  90 Degrees    Left Shoulder Extension  60 Degrees    Left Shoulder Flexion  168 Degrees    Left Shoulder ABduction  168 Degrees    Left Shoulder Internal Rotation  75 Degrees    Left Shoulder External Rotation  90 Degrees     Pt approves physical exam and treatment by PT     LYMPHEDEMA/ONCOLOGY QUESTIONNAIRE - 01/03/19 1629      Type   Cancer Type  Right breast      Surgeries   Lumpectomy Date  02/23/18    Sentinel Lymph Node Biopsy Date  02/23/18    Number Lymph Nodes Removed  2      Treatment   Active Chemotherapy Treatment  No   gets herceptin infusions    Past Chemotherapy Treatment  Yes    Active Radiation Treatment  No    Past Radiation Treatment  Yes    Current Hormone Treatment  No      What other symptoms do you have   Are you Having Heaviness or Tightness  Yes    Are you having Pain  Yes    Are you having pitting edema  Yes      Lymphedema Assessments   Lymphedema Assessments  Upper extremities      Right Upper Extremity Lymphedema   15 cm Proximal to Olecranon Process  44 cm    10 cm Proximal to Olecranon Process  37.4 cm    Olecranon Process  29.4 cm    15 cm Proximal to Ulnar Styloid Process  29.2 cm    10 cm Proximal to Ulnar Styloid Process  23.4 cm    Just Proximal to Ulnar Styloid Process  17.7 cm    Across Hand at [employer]  20.3 cm    At [place] of 2nd Digit  6.5 cm    Other  8      Left Upper Extremity Lymphedema   15 cm Proximal to Olecranon Process  43.2 cm    10 cm Proximal to Olecranon Process  37 cm    Olecranon Process  29.8 cm    15 cm Proximal to Ulnar Styloid Process  28 cm    10 cm Proximal to Ulnar Styloid Process  22.7 cm    Just Proximal to Ulnar Styloid Process  17 cm    Across Hand at [employer]  20.9 cm    At [place] of 2nd Digit  6.4 cm    Other  8              [facility]patient [facility]  Lymphedema Life Impact Scale Total Score  16.18 %      Objective measurements completed on examination: See above findings.              PT Education - 01/03/19 1700    Education Details  PT POC    Person(s) Educated  Patient    Methods  Explanation    Comprehension  Verbalized understanding          PT Long Term Goals - 01/03/19 1707      PT LONG TERM GOAL #1   Title  Pt will improve Rt shoulder flexion from 150 to  165 to maximize function    Time  4    Period  Weeks      PT LONG TERM GOAL #2   Title  Pt will be independent in manual lymph drainage and use of compression for managment of breast and axillary edema     Time  4    Period  Weeks    Status  New      PT LONG TERM GOAL #3   Title  Pt will decrease Rt breast pain to intermittent only     Time  4    Period  Weeks    Status  New             Plan - 01/03/19 1701    Clinical Impression Statement  Pt return to therapy here post radiation with continued Rt breast lymphedema and upper right arm lymphedema.  Resting breast pain is present as well as fibrosis and peau d orange skin changes.  fibrosis is present in most of the distal portion of the breast.  Some scar tissue at both incisions.  Also with decreased shoulder ROM mainly into flexion    Personal Factors and Comorbidities  Comorbidity 2    Comorbidities  radiation history, lymph node removal    Examination-Activity Limitations  Lift    Examination-Participation Restrictions  Cleaning    Stability/Clinical Decision Making  Stable/Uncomplicated    Clinical Decision Making  Low    Rehab Potential  Excellent    PT Frequency  2x / week    PT Duration  4 weeks    PT Treatment/Interventions  ADLs/Self Care Home Management;Manual lymph drainage;Compression bandaging;Manual techniques;Taping;DME Instruction;Therapeutic exercise;Patient/family education    PT Next  Visit Plan  begin Rt breast and upper arm MLD with pt education as able.  script back for bras?  R shoulder and postural exercise progressions as able    Recommended Other Services  bras at [facility] and compression sleeve with [facility]    Consulted and Agree with Plan of Care  Patient       Patient will benefit from skilled therapeutic intervention in order to improve the following deficits and impairments:  Decreased skin integrity, Increased edema, Impaired UE functional use, Postural dysfunction, Pain, Decreased knowledge of use of DME, Decreased knowledge of precautions  Visit Diagnosis: Lymphedema, not elsewhere classified  Aftercare following surgery for neoplasm  Abnormal posture  Malignant neoplasm of upper-outer quadrant of right breast in female, estrogen receptor positive (HCC)  Stiffness of right shoulder, not elsewhere classified     Problem List Patient Active Problem List   Diagnosis Date Noted  . Type 2 diabetes mellitus with other specified complication ([place]) 81/07/7508  . Hematochezia 03/30/2018  . Insomnia 03/30/2018  . Port-A-Cath in place 03/29/2018  . Genetic testing 01/30/2018  . 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 Family history of breast cancer   . Family history of colon cancer   . Family history of lung cancer   . Diabetes mellitus type 2 without retinopathy ([place]) 01/18/2018  . Morbid obesity ([place]) 01/18/2018  . Malignant neoplasm of upper-outer quadrant of right breast in female, estrogen receptor positive ([place]) 01/17/2018  . Achilles tendinitis 11/28/2017  . Hot flashes 09/30/2017  . Hypertension 09/08/2017  . Contusion of left knee and lower leg 11/18/2015  . Mild intermittent asthma 08/15/2013  . BV (bacterial vaginosis) 08/11/2012  . Right  knee pain 07/05/2012  . GERD (gastroesophageal reflux disease) 07/05/2012   . GC (gonococcus) 05/30/2012  . Chlamydia 05/30/2012  . Left knee pain 01/12/2012  . Cough 11/01/2011  . Encounter for long-term (current) use of high-risk medication 02/25/2011  . Preventative health care 02/14/2011  . VITAMIN D DEFICIENCY 01/30/2010  . ANEMIA-IRON DEFICIENCY 01/30/2010  . Edema 01/30/2010  . HIV (human immunodeficiency virus infection) ([place]) 03/12/2009  . GENITAL HERPES 03/12/2009  . Anxiety state 11/27/2007  . 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 Hyperlipidemia 08/02/2007    Shan Levans, PT 01/03/2019, 5:08 PM  [place], [name], [address] Phone: [phone]   Fax:  [phone]  Name: GRACELYNN BIRCHER MRN: [id number] Date of Birth: Sep 12, 1970

## 2019-01-04 ENCOUNTER — Ambulatory Visit: Payer: BC Managed Care – PPO

## 2019-01-04 ENCOUNTER — Other Ambulatory Visit: Payer: Self-pay

## 2019-01-04 DIAGNOSIS — I890 Lymphedema, not elsewhere classified: Secondary | ICD-10-CM

## 2019-01-04 DIAGNOSIS — R293 Abnormal posture: Secondary | ICD-10-CM

## 2019-01-04 DIAGNOSIS — Z170 Estrogen receptor positive status [ER+]: Secondary | ICD-10-CM

## 2019-01-04 DIAGNOSIS — C50411 Malignant neoplasm of upper-outer quadrant of right female breast: Secondary | ICD-10-CM

## 2019-01-04 DIAGNOSIS — Z483 Aftercare following surgery for neoplasm: Secondary | ICD-10-CM

## 2019-01-04 DIAGNOSIS — M25611 Stiffness of right shoulder, not elsewhere classified: Secondary | ICD-10-CM

## 2019-01-04 NOTE — Patient Instructions (Signed)
Self manual lymph drainage:    Cancer Rehab [phone] Perform this sequence once a day.  Only give enough pressure no your skin to make the skin move.  Hug yourself.  Do circles at your neck just above your collarbones.  Repeat this 10 times.  Diaphragmatic - Supine   Inhale through nose making navel move out toward hands. Exhale through puckered lips, hands follow navel in. Repeat _5__ times. Rest _10__ seconds between repeats.   Axilla - One at a Time   Using full weight of flat hand and fingers at center of uninvolved armpit, make _10__ in-place circles.   LEG: Inguinal Nodes Stimulation   With small finger side of hand against hip crease on involved side, gently perform circles at the crease. Repeat __10_ times.    1) Axilla to Inguinal Nodes - Sweep   On involved side, sweep _4__ times from armpit along side of trunk to hip crease.  Now gently stretch skin from the involved side to the uninvolved side across the chest at the shoulder line.  Repeat that 4 times.  Draw an imaginary diagonal line from upper outer breast through the nipple area toward lower inner breast.  Direct fluid upward and inward from this line toward the pathway across your upper chest .  Do this in three rows to treat all of the upper inner breast tissue, and do each row 3-4x.      Direct fluid to treat all of lower outer breast tissue downward and outward toward pathway that is aimed at the left groin.  Finish by doing the pathways as described above going from your involved armpit to the same side groin and going across your upper chest from the involved shoulder to the uninvolved shoulder.  Repeat the steps above where you do circles in your rightt groin and left armpit.

## 2019-01-05 NOTE — Therapy (Signed)
[place], [name], [address] Phone: [phone]   Fax:  [phone]  Physical Therapy Treatment  Patient Details  Name: Kelly Rowe MRN: [id number] Date of Birth: 1971/01/09 Referring Provider (PT): Dr. Barry Dienes    Encounter Date: 01/04/2019  PT End of Session - 01/04/19 0859    Visit Number  2    Number of Visits  9    Date for PT Re-Evaluation  01/31/19    PT Start Time  0808   pt arrived late   PT Stop Time  0858    PT Time Calculation (min)  50 min    Activity Tolerance  Patient tolerated treatment well    Behavior During Therapy  [facility] for tasks assessed/performed       Past Medical History:  Diagnosis Date  . ANEMIA-IRON DEFICIENCY 01/30/2010  . ANXIETY 11/27/2007  . Arthritis   . Asthma 02/25/2011  . Cancer ([place])    breast  . Carbuncle and furuncle of trunk 04/16/2010  . Chlamydia infection 03/21/2008  . DIABETES MELLITUS, TYPE II 08/02/2007  . Edema 01/30/2010  . ELEVATED BLOOD PRESSURE WITHOUT DIAGNOSIS OF HYPERTENSION 08/02/2007  . Family history of breast cancer   . Family history of colon cancer   . Family history of lung cancer   . FREQUENCY, URINARY 05/19/2009  . GENITAL HERPES 03/12/2009  . GERD (gastroesophageal reflux disease)   . History of kidney stones   . History of radiation therapy 07/27/18- 09/06/18   Right Breast, Right Axillary and Supraclavicular nodes 50 Gy in 25 fractions, Right Breast Boost 10 Gy in 5 fractions  . HIV (human immunodeficiency virus infection) ([place]) 2009  . HIV INFECTION 03/12/2009  . HSV (herpes simplex virus) infection   . HYPERLIPIDEMIA 08/02/2007  . Hypertension   . Metrorrhagia 03/21/2008  . PONV (postoperative nausea and vomiting)    woke up crying per patient   . RETENTION, URINE 05/19/2009  . Trichomonas infection 01/19/2010  . VITAMIN D DEFICIENCY 01/30/2010    Past Surgical History:  Procedure Laterality Date  . ABDOMINAL HYSTERECTOMY   07/22/2010   TAH WITH PRESERVATION OF BOTH TUBES AND OVARIES  . BREAST LUMPECTOMY WITH RADIOACTIVE SEED AND SENTINEL LYMPH NODE BIOPSY Right 02/23/2018   Procedure: BREAST LUMPECTOMY WITH RADIOACTIVE SEED AND SENTINEL LYMPH NODE BIOPSY;  Surgeon: Stark Klein, MD;  Location: [place];  Service: General;  Laterality: Right;  . CHOLECYSTECTOMY    . ENDOMETRIAL ABLATION  01/11/2008   HER OPTION  . ESOPHAGOGASTRODUODENOSCOPY    . MULTIPLE TOOTH EXTRACTIONS    . PORTACATH PLACEMENT Left 02/23/2018   Procedure: INSERTION PORT-A-CATH;  Surgeon: Stark Klein, MD;  Location: [place];  Service: General;  Laterality: Left;  . PORTACATH PLACEMENT N/A 04/21/2018   Procedure: PORT-A-CATH REVISION;  Surgeon: Stark Klein, MD;  [facility];  Service: General;  Laterality: N/A;  . TUBAL LIGATION    . WISDOM TOOTH EXTRACTION      There were no vitals filed for this visit.  Subjective Assessment - 01/04/19 0810    Subjective  Nothing new since evaluation yesterday.                  Outpatient Rehab from [facility]27/[facility]  Lymphedema Life Impact Scale Total Score  16.18 %           [facility] Adult PT Treatment/Exercise - 01/04/19 0001      Manual Therapy   Manual Therapy  Manual Lymphatic Drainage (MLD)    Manual Lymphatic Drainage (MLD)  In Supine: Short neck, superficial and deep abdominals, Rt inguinal and Lt axilla nodes, Rt axillo-inguinal and anterior inter-axillary anastomosis and then focused on Rt breast, then into Lt S/L for lateral breast and further work along Rt axillo-inguinal anastomosis, and finished in supine retracing all steps beginning to instruct pt throughout.              PT Education - 01/04/19 0958    Education Details  Rt breast self MLD    Person(s) Educated  Patient    Methods  Explanation;Demonstration;Handout;Tactile cues;Verbal cues    Comprehension  Verbalized understanding;Returned demonstration;Tactile cues  required;Need further instruction          PT Long Term Goals - 01/03/19 1707      PT LONG TERM GOAL #1   Title  Pt will improve Rt shoulder flexion from 150 to 165 to maximize function    Time  4    Period  Weeks      PT LONG TERM GOAL #2   Title  Pt will be independent in manual lymph drainage and use of compression for managment of breast and axillary edema     Time  4    Period  Weeks    Status  New      PT LONG TERM GOAL #3   Title  Pt will decrease Rt breast pain to intermittent only     Time  4    Period  Weeks    Status  New            Plan - 01/04/19 0905    Clinical Impression Statement  First session of manual lymph drainage to Rt breast today which pt tolerated very well and was able to recall some of principles of MLD from January episode of care. She did well with return of technique with hand over hand pressure for instruction of correct pressure, she did well with skin stretch, no slide. Softening of breast noticed by therapist by end of session.    Comorbidities  radiation history, lymph node removal    Examination-Activity Limitations  Lift    Examination-Participation Restrictions  Cleaning    Stability/Clinical Decision Making  Stable/Uncomplicated    Rehab Potential  Excellent    PT Frequency  2x / week    PT Duration  4 weeks    PT Treatment/Interventions  ADLs/Self Care Home Management;Manual lymph drainage;Compression bandaging;Manual techniques;Taping;DME Instruction;Therapeutic exercise;Patient/family education    PT Next Visit Plan  Cont and review Rt breast MLD; script back for compression bra? Rt shoulder and postural ex progression as able    Consulted and Agree with Plan of Care  Patient       Patient will benefit from skilled therapeutic intervention in order to improve the following deficits and impairments:  Decreased skin integrity, Increased edema, Impaired UE functional use, Postural dysfunction, Pain, Decreased knowledge of use of  DME, Decreased knowledge of precautions  Visit Diagnosis: Lymphedema, not elsewhere classified  Aftercare following surgery for neoplasm  Abnormal posture  Malignant neoplasm of upper-outer quadrant of right breast in female, estrogen receptor positive (HCC)  Stiffness of right shoulder, not elsewhere classified     Problem List Patient Active Problem List   Diagnosis Date Noted  . Type 2 diabetes mellitus with other specified complication ([place]) 60/04/9322  . Hematochezia 03/30/2018  . Insomnia 03/30/2018  . Port-A-Cath in place 03/29/2018  . Genetic testing 01/30/2018  .  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 Family history of breast cancer   . Family history of colon cancer   . Family history of lung cancer   . Diabetes mellitus type 2 without retinopathy ([place]) 01/18/2018  . Morbid obesity ([place]) 01/18/2018  . Malignant neoplasm of upper-outer quadrant of right breast in female, estrogen receptor positive ([place]) 01/17/2018  . Achilles tendinitis 11/28/2017  . Hot flashes 09/30/2017  . Hypertension 09/08/2017  . Contusion of left knee and lower leg 11/18/2015  . Mild intermittent asthma 08/15/2013  . BV (bacterial vaginosis) 08/11/2012  . Right knee pain 07/05/2012  . GERD (gastroesophageal reflux disease) 07/05/2012  . GC (gonococcus) 05/30/2012  . Chlamydia 05/30/2012  . Left knee pain 01/12/2012  . Cough 11/01/2011  . Encounter for long-term (current) use of high-risk medication 02/25/2011  . Preventative health care 02/14/2011  . VITAMIN D DEFICIENCY 01/30/2010  . ANEMIA-IRON DEFICIENCY 01/30/2010  . Edema 01/30/2010  . HIV (human immunodeficiency virus infection) ([place]) 03/12/2009  . GENITAL HERPES 03/12/2009  . Anxiety state 11/27/2007  . 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 Hyperlipidemia 08/02/2007    Otelia Limes, PTA 01/05/2019, 12:02 AM  [place], [name], [address] Phone: [phone]   Fax:  [phone]  Name: Kelly Rowe MRN: [id number] Date of Birth: 10-Apr-1971

## 2019-01-08 ENCOUNTER — Inpatient Hospital Stay: Payer: BC Managed Care – PPO

## 2019-01-08 ENCOUNTER — Other Ambulatory Visit: Payer: Self-pay

## 2019-01-08 ENCOUNTER — Inpatient Hospital Stay: Payer: BC Managed Care – PPO | Attending: Oncology

## 2019-01-08 VITALS — BP 108/66 | HR 79 | Temp 97.8°F | Resp 20 | Ht 62.0 in | Wt 257.2 lb

## 2019-01-08 DIAGNOSIS — Z833 Family history of diabetes mellitus: Secondary | ICD-10-CM | POA: Insufficient documentation

## 2019-01-08 DIAGNOSIS — E119 Type 2 diabetes mellitus without complications: Secondary | ICD-10-CM | POA: Insufficient documentation

## 2019-01-08 DIAGNOSIS — Z170 Estrogen receptor positive status [ER+]: Secondary | ICD-10-CM | POA: Diagnosis not present

## 2019-01-08 DIAGNOSIS — Z794 Long term (current) use of insulin: Secondary | ICD-10-CM | POA: Diagnosis not present

## 2019-01-08 DIAGNOSIS — Z803 Family history of malignant neoplasm of breast: Secondary | ICD-10-CM | POA: Insufficient documentation

## 2019-01-08 DIAGNOSIS — C50411 Malignant neoplasm of upper-outer quadrant of right female breast: Secondary | ICD-10-CM

## 2019-01-08 DIAGNOSIS — Z79899 Other long term (current) drug therapy: Secondary | ICD-10-CM | POA: Insufficient documentation

## 2019-01-08 DIAGNOSIS — Z823 Family history of stroke: Secondary | ICD-10-CM | POA: Insufficient documentation

## 2019-01-08 DIAGNOSIS — D709 Neutropenia, unspecified: Secondary | ICD-10-CM | POA: Diagnosis not present

## 2019-01-08 DIAGNOSIS — Z800 Family history of malignant neoplasm of digestive organs: Secondary | ICD-10-CM | POA: Diagnosis not present

## 2019-01-08 DIAGNOSIS — Z801 Family history of malignant neoplasm of trachea, bronchus and lung: Secondary | ICD-10-CM | POA: Insufficient documentation

## 2019-01-08 DIAGNOSIS — Z5112 Encounter for antineoplastic immunotherapy: Secondary | ICD-10-CM | POA: Diagnosis present

## 2019-01-08 DIAGNOSIS — C50911 Malignant neoplasm of unspecified site of right female breast: Secondary | ICD-10-CM

## 2019-01-08 DIAGNOSIS — D696 Thrombocytopenia, unspecified: Secondary | ICD-10-CM | POA: Diagnosis not present

## 2019-01-08 DIAGNOSIS — Z8042 Family history of malignant neoplasm of prostate: Secondary | ICD-10-CM | POA: Insufficient documentation

## 2019-01-08 DIAGNOSIS — Z8249 Family history of ischemic heart disease and other diseases of the circulatory system: Secondary | ICD-10-CM | POA: Insufficient documentation

## 2019-01-08 DIAGNOSIS — Z95828 Presence of other vascular implants and grafts: Secondary | ICD-10-CM

## 2019-01-08 LAB — CBC WITH DIFFERENTIAL (CANCER CENTER ONLY)
Abs Immature Granulocytes: 0.02 10*3/uL (ref 0.00–0.07)
Basophils Absolute: 0 10*3/uL (ref 0.0–0.1)
Basophils Relative: 0 %
Eosinophils Absolute: 0 10*3/uL (ref 0.0–0.5)
Eosinophils Relative: 1 %
HCT: 37 % (ref 36.0–46.0)
Hemoglobin: 11.6 g/dL — ABNORMAL LOW (ref 12.0–15.0)
Immature Granulocytes: 1 %
Lymphocytes Relative: 28 %
Lymphs Abs: 1.1 10*3/uL (ref 0.7–4.0)
MCH: 27.6 pg (ref 26.0–34.0)
MCHC: 31.4 g/dL (ref 30.0–36.0)
MCV: 88.1 fL (ref 80.0–100.0)
Monocytes Absolute: 0.3 10*3/uL (ref 0.1–1.0)
Monocytes Relative: 8 %
Neutro Abs: 2.4 10*3/uL (ref 1.7–7.7)
Neutrophils Relative %: 62 %
Platelet Count: 221 10*3/uL (ref 150–400)
RBC: 4.2 MIL/uL (ref 3.87–5.11)
RDW: 14.7 % (ref 11.5–15.5)
WBC Count: 3.9 10*3/uL — ABNORMAL LOW (ref 4.0–10.5)
nRBC: 0 % (ref 0.0–0.2)

## 2019-01-08 LAB — CMP (CANCER CENTER ONLY)
ALT: 21 U/L (ref 0–44)
AST: 19 U/L (ref 15–41)
Albumin: 3.3 g/dL — ABNORMAL LOW (ref 3.5–5.0)
Alkaline Phosphatase: 113 U/L (ref 38–126)
Anion gap: 12 (ref 5–15)
BUN: 10 mg/dL (ref 6–20)
CO2: 24 mmol/L (ref 22–32)
Calcium: 8.9 mg/dL (ref 8.9–10.3)
Chloride: 102 mmol/L (ref 98–111)
Creatinine: 0.87 mg/dL (ref 0.44–1.00)
GFR, Est AFR Am: >60 mL/min (ref >60)
GFR, Estimated: >60 mL/min (ref >60)
Glucose, Bld: 191 mg/dL — ABNORMAL HIGH (ref 70–99)
Potassium: 3.5 mmol/L (ref 3.5–5.1)
Sodium: 138 mmol/L (ref 135–145)
Total Bilirubin: 0.3 mg/dL (ref 0.3–1.2)
Total Protein: 7.2 g/dL (ref 6.5–8.1)

## 2019-01-08 MED ORDER — SODIUM CHLORIDE 0.9% FLUSH
10.0000 mL | INTRAVENOUS | Status: DC | PRN
  Administered 2019-01-08: 16:00:00 10 mL
  Filled 2019-01-08: qty 10

## 2019-01-08 MED ORDER — SODIUM CHLORIDE 0.9% FLUSH
10.0000 mL | Freq: Once | INTRAVENOUS | Status: AC
  Administered 2019-01-08: 10 mL
  Filled 2019-01-08: qty 10

## 2019-01-08 MED ORDER — ACETAMINOPHEN 325 MG PO TABS
ORAL_TABLET | ORAL | Status: AC
  Filled 2019-01-08: qty 2

## 2019-01-08 MED ORDER — DIPHENHYDRAMINE HCL 25 MG PO CAPS
ORAL_CAPSULE | ORAL | Status: AC
  Filled 2019-01-08: qty 1

## 2019-01-08 MED ORDER — SODIUM CHLORIDE 0.9 % IV SOLN
Freq: Once | INTRAVENOUS | Status: AC
  Administered 2019-01-08: 15:00:00 via INTRAVENOUS
  Filled 2019-01-08: qty 250

## 2019-01-08 MED ORDER — ACETAMINOPHEN 325 MG PO TABS
650.0000 mg | ORAL_TABLET | Freq: Once | ORAL | Status: AC
  Administered 2019-01-08: 15:00:00 650 mg via ORAL

## 2019-01-08 MED ORDER — TRASTUZUMAB CHEMO 150 MG IV SOLR
6.0000 mg/kg | Freq: Once | INTRAVENOUS | Status: AC
  Administered 2019-01-08: 714 mg via INTRAVENOUS
  Filled 2019-01-08: qty 34

## 2019-01-08 MED ORDER — DIPHENHYDRAMINE HCL 25 MG PO CAPS
25.0000 mg | ORAL_CAPSULE | Freq: Once | ORAL | Status: AC
  Administered 2019-01-08: 15:00:00 25 mg via ORAL

## 2019-01-08 MED ORDER — HEPARIN SOD (PORK) LOCK FLUSH 100 UNIT/ML IV SOLN
500.0000 [IU] | Freq: Once | INTRAVENOUS | Status: AC | PRN
  Administered 2019-01-08: 16:00:00 500 [IU]
  Filled 2019-01-08: qty 5

## 2019-01-08 MED FILL — TAMOXIFEN CITRATE 20 MG TAB: 20 | 90 days supply | Qty: 90 | Fill #1

## 2019-01-08 MED FILL — VENLAFAXINE HCL ER 37.5 MG: 37.5 | 90 days supply | Qty: 90 | Fill #1

## 2019-01-08 NOTE — Patient Instructions (Signed)
Cancer Center Discharge Instructions for Patients Receiving Chemotherapy  Today you received the following chemotherapy agents Herceptin  To help prevent nausea and vomiting after your treatment, we encourage you to take your nausea medication as directed   If you develop nausea and vomiting that is not controlled by your nausea medication, call the clinic.   BELOW ARE SYMPTOMS THAT SHOULD BE REPORTED IMMEDIATELY:  *FEVER GREATER THAN 100.5 F  *CHILLS WITH OR WITHOUT FEVER  NAUSEA AND VOMITING THAT IS NOT CONTROLLED WITH YOUR NAUSEA MEDICATION  *UNUSUAL SHORTNESS OF BREATH  *UNUSUAL BRUISING OR BLEEDING  TENDERNESS IN MOUTH AND THROAT WITH OR WITHOUT PRESENCE OF ULCERS  *URINARY PROBLEMS  *BOWEL PROBLEMS  UNUSUAL RASH Items with * indicate a potential emergency and should be followed up as soon as possible.  Feel free to call the clinic should you have any questions or concerns. The clinic phone number is [phone].  Please show the CHEMO ALERT CARD at check-in to the Emergency Department and triage nurse.   

## 2019-01-10 ENCOUNTER — Other Ambulatory Visit: Payer: Self-pay

## 2019-01-10 ENCOUNTER — Encounter: Payer: Self-pay | Admitting: Rehabilitation

## 2019-01-10 ENCOUNTER — Ambulatory Visit: Payer: BC Managed Care – PPO | Admitting: Rehabilitation

## 2019-01-10 DIAGNOSIS — I890 Lymphedema, not elsewhere classified: Secondary | ICD-10-CM | POA: Diagnosis not present

## 2019-01-10 DIAGNOSIS — M25611 Stiffness of right shoulder, not elsewhere classified: Secondary | ICD-10-CM

## 2019-01-10 DIAGNOSIS — Z170 Estrogen receptor positive status [ER+]: Secondary | ICD-10-CM

## 2019-01-10 DIAGNOSIS — R293 Abnormal posture: Secondary | ICD-10-CM

## 2019-01-10 DIAGNOSIS — C50411 Malignant neoplasm of upper-outer quadrant of right female breast: Secondary | ICD-10-CM

## 2019-01-10 DIAGNOSIS — Z483 Aftercare following surgery for neoplasm: Secondary | ICD-10-CM

## 2019-01-10 NOTE — Therapy (Signed)
[place], [name], [address] Phone: [phone]   Fax:  [phone]  Physical Therapy Treatment  Patient Details  Name: Kelly Rowe MRN: [id number] Date of Birth: 1970/10/08 Referring Provider (PT): Dr. Barry Dienes    Encounter Date: 01/10/2019  PT End of Session - 01/10/19 1557    Visit Number  3    Number of Visits  9    Date for PT Re-Evaluation  01/31/19    PT Start Time  1500    PT Stop Time  1545    PT Time Calculation (min)  45 min    Activity Tolerance  Patient tolerated treatment well    Behavior During Therapy  [facility] for tasks assessed/performed       Past Medical History:  Diagnosis Date  . ANEMIA-IRON DEFICIENCY 01/30/2010  . ANXIETY 11/27/2007  . Arthritis   . Asthma 02/25/2011  . Cancer ([place])    breast  . Carbuncle and furuncle of trunk 04/16/2010  . Chlamydia infection 03/21/2008  . DIABETES MELLITUS, TYPE II 08/02/2007  . Edema 01/30/2010  . ELEVATED BLOOD PRESSURE WITHOUT DIAGNOSIS OF HYPERTENSION 08/02/2007  . Family history of breast cancer   . Family history of colon cancer   . Family history of lung cancer   . FREQUENCY, URINARY 05/19/2009  . GENITAL HERPES 03/12/2009  . GERD (gastroesophageal reflux disease)   . History of kidney stones   . History of radiation therapy 07/27/18- 09/06/18   Right Breast, Right Axillary and Supraclavicular nodes 50 Gy in 25 fractions, Right Breast Boost 10 Gy in 5 fractions  . HIV (human immunodeficiency virus infection) ([place]) 2009  . HIV INFECTION 03/12/2009  . HSV (herpes simplex virus) infection   . HYPERLIPIDEMIA 08/02/2007  . Hypertension   . Metrorrhagia 03/21/2008  . PONV (postoperative nausea and vomiting)    woke up crying per patient   . RETENTION, URINE 05/19/2009  . Trichomonas infection 01/19/2010  . VITAMIN D DEFICIENCY 01/30/2010    Past Surgical History:  Procedure Laterality Date  . ABDOMINAL HYSTERECTOMY  07/22/2010   TAH WITH  PRESERVATION OF BOTH TUBES AND OVARIES  . BREAST LUMPECTOMY WITH RADIOACTIVE SEED AND SENTINEL LYMPH NODE BIOPSY Right 02/23/2018   Procedure: BREAST LUMPECTOMY WITH RADIOACTIVE SEED AND SENTINEL LYMPH NODE BIOPSY;  Surgeon: Stark Klein, MD;  Location: [place];  Service: General;  Laterality: Right;  . CHOLECYSTECTOMY    . ENDOMETRIAL ABLATION  01/11/2008   HER OPTION  . ESOPHAGOGASTRODUODENOSCOPY    . MULTIPLE TOOTH EXTRACTIONS    . PORTACATH PLACEMENT Left 02/23/2018   Procedure: INSERTION PORT-A-CATH;  Surgeon: Stark Klein, MD;  Location: [place];  Service: General;  Laterality: Left;  . PORTACATH PLACEMENT N/A 04/21/2018   Procedure: PORT-A-CATH REVISION;  Surgeon: Stark Klein, MD;  [facility];  Service: General;  Laterality: N/A;  . TUBAL LIGATION    . WISDOM TOOTH EXTRACTION      There were no vitals filed for this visit.  Subjective Assessment - 01/10/19 1459    Subjective  Maybe not as much pain as when I first started.  Doing the massage everyday.    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 Chemotherapy completed 07/05/18, Herceptin started with chemo and continues through August of this year.  Radiation 07/27/18-09/06/18 to the Rt breast, Antiestrogens to follow. Other medical history to include asthma, DM, HTN, HIV,  and hysterectomy    Currently in Pain?  No/denies                  Outpatient [facility]  Lymphedema Life Impact Scale Total Score  16.18 %           [facility] Adult PT Treatment/Exercise - 01/10/19 0001      Manual Therapy   Manual therapy comments  [facility] per pt chart note and signed back for sleeve which should be measured by Melissa     Manual Lymphatic Drainage (MLD)  Assessed pt performance of self MLD with moderate instructio needed for pathways; PT also provided additional  MLD along with patient: In Supine: Short neck, 5 deep breaths, Rt inguinal and Lt axilla nodes, Rt axillo-inguinal and anterior inter-axillary anastomosis and then focused on Rt breast, performed in supine and again in seated to show difference in position.  Then in supine again work to the Rt upper arm and finishing with pathways                  PT Long Term Goals - 01/03/19 1707      PT LONG TERM GOAL #1   Title  Pt will improve Rt shoulder flexion from 150 to 165 to maximize function    Time  4    Period  Weeks      PT LONG TERM GOAL #2   Title  Pt will be independent in manual lymph drainage and use of compression for managment of breast and axillary edema     Time  4    Period  Weeks    Status  New      PT LONG TERM GOAL #3   Title  Pt will decrease Rt breast pain to intermittent only     Time  4    Period  Weeks    Status  New            Plan - 01/10/19 1558    Clinical Impression Statement  Pt reports overall less breast pain today and is able to sleep more onthe Rt side.  Continues with medial/inferior breast fibrosis and lateral breast inferior to the incision    PT Next Visit Plan  cont Rt breast and upper arm MLD and education, postural exercises       Patient will benefit from skilled therapeutic intervention in order to improve the following deficits and impairments:     Visit Diagnosis: 1. Lymphedema, not elsewhere classified   2. Aftercare following surgery for neoplasm   3. Abnormal posture   4. Malignant neoplasm of upper-outer quadrant of right breast in female, estrogen receptor positive ([place])   5. Stiffness of right shoulder, not elsewhere classified        Problem List Patient Active Problem List   Diagnosis Date Noted  . Type 2 diabetes mellitus with other specified complication ([place]) 95/62/1308  . Hematochezia 03/30/2018  . Insomnia 03/30/2018  . Port-A-Cath in place 03/29/2018  . Genetic testing 01/30/2018  . Neoplasm of  breast, regional lymph node staging category N3b: metastasis in ipsilateral internal mammary lymph node and axillary lymph node ([place]) 01/30/2018  . Family history of breast cancer   . Family history of colon cancer   . Family history of lung cancer   . Diabetes mellitus type 2 without retinopathy ([place]) 01/18/2018  . Morbid obesity ([place]) 01/18/2018  . Malignant neoplasm of upper-outer quadrant  of right breast in female, estrogen receptor positive ([place]) 01/17/2018  . Achilles tendinitis 11/28/2017  . Hot flashes 09/30/2017  . Hypertension 09/08/2017  . Contusion of left knee and lower leg 11/18/2015  . Mild intermittent asthma 08/15/2013  . BV (bacterial vaginosis) 08/11/2012  . Right knee pain 07/05/2012  . GERD (gastroesophageal reflux disease) 07/05/2012  . GC (gonococcus) 05/30/2012  . Chlamydia 05/30/2012  . Left knee pain 01/12/2012  . Cough 11/01/2011  . Encounter for long-term (current) use of high-risk medication 02/25/2011  . Preventative health care 02/14/2011  . VITAMIN D DEFICIENCY 01/30/2010  . ANEMIA-IRON DEFICIENCY 01/30/2010  . Edema 01/30/2010  . HIV (human immunodeficiency virus infection) ([place]) 03/12/2009  . GENITAL HERPES 03/12/2009  . Anxiety state 11/27/2007  . 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 Hyperlipidemia 08/02/2007    Shan Levans, PT 01/10/2019, 3:59 PM  [place], [name], [address] Phone: [phone]   Fax:  [phone]  Name: Kelly Rowe MRN: [id number] Date of Birth: [birth year]

## 2019-01-12 ENCOUNTER — Encounter

## 2019-01-12 ENCOUNTER — Ambulatory Visit: Payer: BC Managed Care – PPO | Admitting: Rehabilitation

## 2019-01-12 ENCOUNTER — Other Ambulatory Visit: Payer: Self-pay

## 2019-01-12 ENCOUNTER — Encounter: Payer: Self-pay | Admitting: Rehabilitation

## 2019-01-12 DIAGNOSIS — I890 Lymphedema, not elsewhere classified: Secondary | ICD-10-CM | POA: Diagnosis not present

## 2019-01-12 DIAGNOSIS — Z170 Estrogen receptor positive status [ER+]: Secondary | ICD-10-CM

## 2019-01-12 DIAGNOSIS — R293 Abnormal posture: Secondary | ICD-10-CM

## 2019-01-12 DIAGNOSIS — M25611 Stiffness of right shoulder, not elsewhere classified: Secondary | ICD-10-CM

## 2019-01-12 DIAGNOSIS — C50411 Malignant neoplasm of upper-outer quadrant of right female breast: Secondary | ICD-10-CM

## 2019-01-12 DIAGNOSIS — Z483 Aftercare following surgery for neoplasm: Secondary | ICD-10-CM

## 2019-01-12 NOTE — Therapy (Signed)
[place], [name], [address] Phone: [phone]   Fax:  [phone]  Physical Therapy Treatment  Patient Details  Name: Kelly Rowe MRN: [id number] Date of Birth: 1970/12/16 Referring Provider (PT): Dr. Barry Dienes    Encounter Date: 01/12/2019  PT End of Session - 01/12/19 1100    Visit Number  4    Number of Visits  9    Date for PT Re-Evaluation  01/31/19    PT Start Time  1006    PT Stop Time  1045    PT Time Calculation (min)  39 min    Activity Tolerance  Patient tolerated treatment well    Behavior During Therapy  [facility] for tasks assessed/performed       Past Medical History:  Diagnosis Date  . ANEMIA-IRON DEFICIENCY 01/30/2010  . ANXIETY 11/27/2007  . Arthritis   . Asthma 02/25/2011  . Cancer ([place])    breast  . Carbuncle and furuncle of trunk 04/16/2010  . Chlamydia infection 03/21/2008  . DIABETES MELLITUS, TYPE II 08/02/2007  . Edema 01/30/2010  . ELEVATED BLOOD PRESSURE WITHOUT DIAGNOSIS OF HYPERTENSION 08/02/2007  . Family history of breast cancer   . Family history of colon cancer   . Family history of lung cancer   . FREQUENCY, URINARY 05/19/2009  . GENITAL HERPES 03/12/2009  . GERD (gastroesophageal reflux disease)   . History of kidney stones   . History of radiation therapy 07/27/18- 09/06/18   Right Breast, Right Axillary and Supraclavicular nodes 50 Gy in 25 fractions, Right Breast Boost 10 Gy in 5 fractions  . HIV (human immunodeficiency virus infection) ([place]) 2009  . HIV INFECTION 03/12/2009  . HSV (herpes simplex virus) infection   . HYPERLIPIDEMIA 08/02/2007  . Hypertension   . Metrorrhagia 03/21/2008  . PONV (postoperative nausea and vomiting)    woke up crying per patient   . RETENTION, URINE 05/19/2009  . Trichomonas infection 01/19/2010  . VITAMIN D DEFICIENCY 01/30/2010    Past Surgical History:  Procedure Laterality Date  . ABDOMINAL HYSTERECTOMY  07/22/2010   TAH WITH  PRESERVATION OF BOTH TUBES AND OVARIES  . BREAST LUMPECTOMY WITH RADIOACTIVE SEED AND SENTINEL LYMPH NODE BIOPSY Right 02/23/2018   Procedure: BREAST LUMPECTOMY WITH RADIOACTIVE SEED AND SENTINEL LYMPH NODE BIOPSY;  Surgeon: Stark Klein, MD;  Location: [place];  Service: General;  Laterality: Right;  . CHOLECYSTECTOMY    . ENDOMETRIAL ABLATION  01/11/2008   HER OPTION  . ESOPHAGOGASTRODUODENOSCOPY    . MULTIPLE TOOTH EXTRACTIONS    . PORTACATH PLACEMENT Left 02/23/2018   Procedure: INSERTION PORT-A-CATH;  Surgeon: Stark Klein, MD;  Location: [place];  Service: General;  Laterality: Left;  . PORTACATH PLACEMENT N/A 04/21/2018   Procedure: PORT-A-CATH REVISION;  Surgeon: Stark Klein, MD;  Location: [facility];  Service: General;  Laterality: N/A;  . TUBAL LIGATION    . WISDOM TOOTH EXTRACTION      There were no vitals filed for this visit.  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 Chemotherapy completed 07/05/18, Herceptin started with chemo and continues through August of this year.  Radiation 07/27/18-09/06/18 to the Rt breast, Antiestrogens to follow. Other medical history to include asthma, DM, HTN, HIV, and hysterectomy    Currently in Pain?  No/denies  Outpatient Rehab from 07/27/2018 in [place]  Lymphedema Life Impact Scale Total Score  16.18 %           [facility] Adult PT Treatment/Exercise - 01/12/19 0001      Exercises   Exercises  Shoulder      Shoulder Exercises: Standing   Protraction  Both;10 reps    Protraction Weight (lbs)  pro/ret too hard to get the movement    Row  Both;15 reps    Theraband Level (Shoulder Row)  Level 2 (Red)    Row Limitations  cueing for first time     Other Standing Exercises  3 way raise 1# x 10 each       Shoulder  Exercises: Pulleys   Flexion  2 minutes    Flexion Limitations  cueing needed for first time    ABduction  2 minutes    ABduction Limitations  with cueing for the first time      Shoulder Exercises: Therapy Ball   Flexion  Both;10 reps    Flexion Limitations  with extra lean at the top      Shoulder Exercises: Stretch   Corner Stretch  3 reps;20 seconds      Manual Therapy   Manual Lymphatic Drainage (MLD)  In Supine: Short neck, abdominals, Rt inguinal and Lt axilla nodes, Rt axillo-inguinal and anterior inter-axillary anastomosis and then focused on Rt breast medial and in Lt sidelying to the lateral breast posterior interaxilary utilized as well                  PT Long Term Goals - 01/12/19 1102      PT LONG TERM GOAL #1   Title  Pt will improve Rt shoulder flexion from 150 to 165 to maximize function    Status  Partially Met      PT LONG TERM GOAL #2   Title  Pt will be independent in manual lymph drainage and use of compression for managment of breast and axillary edema     Status  Achieved      PT LONG TERM GOAL #3   Title  Pt will decrease Rt breast pain to intermittent only     Status  Achieved            Plan - 01/12/19 1100    Clinical Impression Statement  Pt reports she is doing much better overall.  Has been able to get her bra on easier and is having no pain usually.  Added some exercises today for postural and remedial focus without pain and good understanding.  Will get measured monday for a sleeve    PT Next Visit Plan  cont Rt breast and upper arm MLD and education, postural exercises       Patient will benefit from skilled therapeutic intervention in order to improve the following deficits and impairments:     Visit Diagnosis: 1. Lymphedema, not elsewhere classified   2. Aftercare following surgery for neoplasm   3. Abnormal posture   4. Malignant neoplasm of upper-outer quadrant of right breast in female, estrogen receptor positive  ([place])   5. Stiffness of right shoulder, not elsewhere classified        Problem List Patient Active Problem List   Diagnosis Date Noted  . Type 2 diabetes mellitus with other specified complication ([place]) 31/51/7616  . Hematochezia 03/30/2018  . Insomnia 03/30/2018  . Port-A-Cath in place 03/29/2018  . Genetic testing 01/30/2018  . Neoplasm  of breast, regional lymph node staging category N3b: metastasis in ipsilateral internal mammary lymph node and axillary lymph node ([place]) 01/30/2018  . Family history of breast cancer   . Family history of colon cancer   . Family history of lung cancer   . Diabetes mellitus type 2 without retinopathy ([place]) 01/18/2018  . Morbid obesity ([place]) 01/18/2018  . Malignant neoplasm of upper-outer quadrant of right breast in female, estrogen receptor positive ([place]) 01/17/2018  . Achilles tendinitis 11/28/2017  . Hot flashes 09/30/2017  . Hypertension 09/08/2017  . Contusion of left knee and lower leg 11/18/2015  . Mild intermittent asthma 08/15/2013  . BV (bacterial vaginosis) 08/11/2012  . Right knee pain 07/05/2012  . GERD (gastroesophageal reflux disease) 07/05/2012  . GC (gonococcus) 05/30/2012  . Chlamydia 05/30/2012  . Left knee pain 01/12/2012  . Cough 11/01/2011  . Encounter for long-term (current) use of high-risk medication 02/25/2011  . Preventative health care 02/14/2011  . VITAMIN D DEFICIENCY 01/30/2010  . ANEMIA-IRON DEFICIENCY 01/30/2010  . Edema 01/30/2010  . HIV (human immunodeficiency virus infection) ([place]) 03/12/2009  . GENITAL HERPES 03/12/2009  . Anxiety state 11/27/2007  . 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 Hyperlipidemia 08/02/2007    Stark Bray 01/12/2019, 11:02 AM  [place], [name], [address] Phone:  [phone]   Fax:  [phone]  Name: Kelly Rowe MRN: [id number] Date of Birth: 05-11-1971

## 2019-01-12 NOTE — Patient Instructions (Signed)
Access Code: [email or web address]  URL: [email or web address]  Date: 01/12/2019  Prepared by: Shan Levans   Exercises  Standing shoulder flexion wall slides - 10 reps - 1 sets - 10seconds hold - 1x daily - 7x weekly  Doorway Pec Stretch at 90 Degrees Abduction - 10 reps - 1-3 sets - 20-30 seconds hold - 1x daily - 7x weekly  Standing Row with Anchored Resistance - 10 reps - 1-3 sets - 1x daily - 7x weekly  Standing Shoulder Flexion to 90 Degrees with Dumbbells - 10 reps - 1-3 sets - 1x daily - 7x weekly  Shoulder Abduction - Thumbs Up - 10 reps - 1-3 sets - 1x daily - 7x weekly

## 2019-01-15 ENCOUNTER — Ambulatory Visit: Payer: BC Managed Care – PPO | Admitting: Rehabilitation

## 2019-01-15 ENCOUNTER — Other Ambulatory Visit: Payer: Self-pay

## 2019-01-15 ENCOUNTER — Encounter: Payer: Self-pay | Admitting: Rehabilitation

## 2019-01-15 DIAGNOSIS — R293 Abnormal posture: Secondary | ICD-10-CM

## 2019-01-15 DIAGNOSIS — I890 Lymphedema, not elsewhere classified: Secondary | ICD-10-CM | POA: Diagnosis not present

## 2019-01-15 DIAGNOSIS — C50411 Malignant neoplasm of upper-outer quadrant of right female breast: Secondary | ICD-10-CM

## 2019-01-15 DIAGNOSIS — M25611 Stiffness of right shoulder, not elsewhere classified: Secondary | ICD-10-CM

## 2019-01-15 DIAGNOSIS — Z170 Estrogen receptor positive status [ER+]: Secondary | ICD-10-CM

## 2019-01-15 DIAGNOSIS — Z483 Aftercare following surgery for neoplasm: Secondary | ICD-10-CM

## 2019-01-15 NOTE — Therapy (Signed)
[place], [name], [address] Phone: [phone]   Fax:  [phone]  Physical Therapy Treatment  Patient Details  Name: BRENEE GAJDA MRN: [id number] Date of Birth: [birth year] Referring Provider (PT): Dr. Barry Dienes    Encounter Date: 01/15/2019  PT End of Session - 01/15/19 1608    Visit Number  5    Number of Visits  9    Date for PT Re-Evaluation  01/31/19    PT Start Time  4818    PT Stop Time  1646    PT Time Calculation (min)  41 min    Activity Tolerance  Patient tolerated treatment well    Behavior During Therapy  [facility] for tasks assessed/performed       Past Medical History:  Diagnosis Date  . ANEMIA-IRON DEFICIENCY 01/30/2010  . ANXIETY 11/27/2007  . Arthritis   . Asthma 02/25/2011  . Cancer ([place])    breast  . Carbuncle and furuncle of trunk 04/16/2010  . Chlamydia infection 03/21/2008  . DIABETES MELLITUS, TYPE II 08/02/2007  . Edema 01/30/2010  . ELEVATED BLOOD PRESSURE WITHOUT DIAGNOSIS OF HYPERTENSION 08/02/2007  . Family history of breast cancer   . Family history of colon cancer   . Family history of lung cancer   . FREQUENCY, URINARY 05/19/2009  . GENITAL HERPES 03/12/2009  . GERD (gastroesophageal reflux disease)   . History of kidney stones   . History of radiation therapy 07/27/18- 09/06/18   Right Breast, Right Axillary and Supraclavicular nodes 50 Gy in 25 fractions, Right Breast Boost 10 Gy in 5 fractions  . HIV (human immunodeficiency virus infection) ([place]) 2009  . HIV INFECTION 03/12/2009  . HSV (herpes simplex virus) infection   . HYPERLIPIDEMIA 08/02/2007  . Hypertension   . Metrorrhagia 03/21/2008  . PONV (postoperative nausea and vomiting)    woke up crying per patient   . RETENTION, URINE 05/19/2009  . Trichomonas infection 01/19/2010  . VITAMIN D DEFICIENCY 01/30/2010    Past Surgical History:  Procedure Laterality Date  . ABDOMINAL HYSTERECTOMY  07/22/2010   TAH WITH  PRESERVATION OF BOTH TUBES AND OVARIES  . BREAST LUMPECTOMY WITH RADIOACTIVE SEED AND SENTINEL LYMPH NODE BIOPSY Right 02/23/2018   Procedure: BREAST LUMPECTOMY WITH RADIOACTIVE SEED AND SENTINEL LYMPH NODE BIOPSY;  Surgeon: Stark Klein, MD;  Location: [place];  Service: General;  Laterality: Right;  . CHOLECYSTECTOMY    . ENDOMETRIAL ABLATION  01/11/2008   HER OPTION  . ESOPHAGOGASTRODUODENOSCOPY    . MULTIPLE TOOTH EXTRACTIONS    . PORTACATH PLACEMENT Left 02/23/2018   Procedure: INSERTION PORT-A-CATH;  Surgeon: Stark Klein, MD;  Location: [place];  Service: General;  Laterality: Left;  . PORTACATH PLACEMENT N/A 04/21/2018   Procedure: PORT-A-CATH REVISION;  Surgeon: Stark Klein, MD;  [facility];  Service: General;  Laterality: N/A;  . TUBAL LIGATION    . WISDOM TOOTH EXTRACTION      There were no vitals filed for this visit.  Subjective Assessment - 01/15/19 1606    Subjective  I am tired today.  I had alot of driving around today.  I have been feeling just really good.  Even my breast feels better.    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 Chemotherapy completed 07/05/18, Herceptin started with chemo and continues through August of this year.  Radiation 07/27/18-09/06/18 to the Rt breast, Antiestrogens  to follow. Other medical history to include asthma, DM, HTN, HIV, and hysterectomy    Currently in Pain?  No/denies                  Outpatient [facility]  Lymphedema Life Impact Scale Total Score  16.18 %           [facility] Adult PT Treatment/Exercise - 01/15/19 0001      Shoulder Exercises: Standing   Other Standing Exercises  3 way raise 2# x 10 each       Shoulder Exercises: Pulleys   Flexion  2 minutes    ABduction  2 minutes      Shoulder Exercises: Therapy Ball   Flexion  Both;10 reps    Flexion Limitations   with extra lean at the top      Manual Therapy   Manual Lymphatic Drainage (MLD)  In Supine: Short neck, abdominals, Rt inguinal and Lt axilla nodes, Rt axillo-inguinal and anterior inter-axillary anastomosis and then focused on Rt breast medial focus and then to the Rt upper arm to the elbow and then reversing all steps.                   PT Long Term Goals - 01/15/19 1651      PT LONG TERM GOAL #1   Title  Pt will improve Rt shoulder flexion from 150 to 165 to maximize function    Status  On-going      PT LONG TERM GOAL #2   Title  Pt will be independent in manual lymph drainage and use of compression for managment of breast and axillary edema     Status  Partially Met      PT LONG TERM GOAL #3   Title  Pt will decrease Rt breast pain to intermittent only     Status  Achieved            Plan - 01/15/19 1648    Clinical Impression Statement  Pt feeling overall very well.  Was measured for a sleeve today and reports no breast pain lately.  MLD performed again to the Rt breast and upper arm with minimal fibrosis medially.  One more visit likey    PT Next Visit Plan  any MLD quesions.  Show pt how to add upper arm into the breast routine, exercise questions?  should probably check in again when garment from [employer] arrives       Patient will benefit from skilled therapeutic intervention in order to improve the following deficits and impairments:     Visit Diagnosis: 1. Lymphedema, not elsewhere classified   2. Aftercare following surgery for neoplasm   3. Abnormal posture   4. Malignant neoplasm of upper-outer quadrant of right breast in female, estrogen receptor positive ([place])   5. Stiffness of right shoulder, not elsewhere classified        Problem List Patient Active Problem List   Diagnosis Date Noted  . Type 2 diabetes mellitus with other specified complication ([place]) 63/78/5885  . Hematochezia 03/30/2018  . Insomnia 03/30/2018  . Port-A-Cath in place  03/29/2018  . Genetic testing 01/30/2018  . Neoplasm of breast, regional lymph node staging category N3b: metastasis in ipsilateral internal mammary lymph node and axillary lymph node ([place]) 01/30/2018  . Family history of breast cancer   . Family history of colon cancer   . Family history of lung cancer   . Diabetes mellitus type  2 without retinopathy ([place]) 01/18/2018  . Morbid obesity ([place]) 01/18/2018  . Malignant neoplasm of upper-outer quadrant of right breast in female, estrogen receptor positive ([place]) 01/17/2018  . Achilles tendinitis 11/28/2017  . Hot flashes 09/30/2017  . Hypertension 09/08/2017  . Contusion of left knee and lower leg 11/18/2015  . Mild intermittent asthma 08/15/2013  . BV (bacterial vaginosis) 08/11/2012  . Right knee pain 07/05/2012  . GERD (gastroesophageal reflux disease) 07/05/2012  . GC (gonococcus) 05/30/2012  . Chlamydia 05/30/2012  . Left knee pain 01/12/2012  . Cough 11/01/2011  . Encounter for long-term (current) use of high-risk medication 02/25/2011  . Preventative health care 02/14/2011  . VITAMIN D DEFICIENCY 01/30/2010  . ANEMIA-IRON DEFICIENCY 01/30/2010  . Edema 01/30/2010  . HIV (human immunodeficiency virus infection) ([place]) 03/12/2009  . GENITAL HERPES 03/12/2009  . Anxiety state 11/27/2007  . 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 Hyperlipidemia 08/02/2007    Shan Levans, PT 01/15/2019, 4:53 PM  [place], [name], [address] Phone: [phone]   Fax:  [phone]  Name: MUNACHIMSO RIGDON MRN: [id number] Date of Birth: 1970/07/29

## 2019-01-17 ENCOUNTER — Ambulatory Visit: Payer: BC Managed Care – PPO | Admitting: Rehabilitation

## 2019-01-17 ENCOUNTER — Other Ambulatory Visit: Payer: Self-pay

## 2019-01-17 ENCOUNTER — Encounter: Payer: Self-pay | Admitting: Rehabilitation

## 2019-01-17 DIAGNOSIS — Z483 Aftercare following surgery for neoplasm: Secondary | ICD-10-CM

## 2019-01-17 DIAGNOSIS — R293 Abnormal posture: Secondary | ICD-10-CM

## 2019-01-17 DIAGNOSIS — Z170 Estrogen receptor positive status [ER+]: Secondary | ICD-10-CM

## 2019-01-17 DIAGNOSIS — I890 Lymphedema, not elsewhere classified: Secondary | ICD-10-CM | POA: Diagnosis not present

## 2019-01-17 DIAGNOSIS — C50411 Malignant neoplasm of upper-outer quadrant of right female breast: Secondary | ICD-10-CM

## 2019-01-17 DIAGNOSIS — M25611 Stiffness of right shoulder, not elsewhere classified: Secondary | ICD-10-CM

## 2019-01-17 MED FILL — SILVADENE 1% CREAM: 1 | 30 days supply | Qty: 400 | Fill #1

## 2019-01-17 NOTE — Therapy (Signed)
[place], [name], [address] Phone: [phone]   Fax:  [phone]  Physical Therapy Treatment  Patient Details  Name: Kelly Rowe MRN: [id number] Date of Birth: Jun 26, 1971 Referring Provider (PT): Dr. Barry Dienes    Encounter Date: 01/17/2019  PT End of Session - 01/17/19 1548    Visit Number  6    Number of Visits  9    Date for PT Re-Evaluation  01/31/19    PT Start Time  1500    PT Stop Time  1545    PT Time Calculation (min)  45 min    Activity Tolerance  Patient tolerated treatment well    Behavior During Therapy  [facility] for tasks assessed/performed       Past Medical History:  Diagnosis Date  . ANEMIA-IRON DEFICIENCY 01/30/2010  . ANXIETY 11/27/2007  . Arthritis   . Asthma 02/25/2011  . Cancer ([place])    breast  . Carbuncle and furuncle of trunk 04/16/2010  . Chlamydia infection 03/21/2008  . DIABETES MELLITUS, TYPE II 08/02/2007  . Edema 01/30/2010  . ELEVATED BLOOD PRESSURE WITHOUT DIAGNOSIS OF HYPERTENSION 08/02/2007  . Family history of breast cancer   . Family history of colon cancer   . Family history of lung cancer   . FREQUENCY, URINARY 05/19/2009  . GENITAL HERPES 03/12/2009  . GERD (gastroesophageal reflux disease)   . History of kidney stones   . History of radiation therapy 07/27/18- 09/06/18   Right Breast, Right Axillary and Supraclavicular nodes 50 Gy in 25 fractions, Right Breast Boost 10 Gy in 5 fractions  . HIV (human immunodeficiency virus infection) ([place]) 2009  . HIV INFECTION 03/12/2009  . HSV (herpes simplex virus) infection   . HYPERLIPIDEMIA 08/02/2007  . Hypertension   . Metrorrhagia 03/21/2008  . PONV (postoperative nausea and vomiting)    woke up crying per patient   . RETENTION, URINE 05/19/2009  . Trichomonas infection 01/19/2010  . VITAMIN D DEFICIENCY 01/30/2010    Past Surgical History:  Procedure Laterality Date  . ABDOMINAL HYSTERECTOMY  07/22/2010   TAH WITH  PRESERVATION OF BOTH TUBES AND OVARIES  . BREAST LUMPECTOMY WITH RADIOACTIVE SEED AND SENTINEL LYMPH NODE BIOPSY Right 02/23/2018   Procedure: BREAST LUMPECTOMY WITH RADIOACTIVE SEED AND SENTINEL LYMPH NODE BIOPSY;  Surgeon: Stark Klein, MD;  Location: [facility];  Service: General;  Laterality: Right;  . CHOLECYSTECTOMY    . ENDOMETRIAL ABLATION  01/11/2008   HER OPTION  . ESOPHAGOGASTRODUODENOSCOPY    . MULTIPLE TOOTH EXTRACTIONS    . PORTACATH PLACEMENT Left 02/23/2018   Procedure: INSERTION PORT-A-CATH;  Surgeon: Stark Klein, MD;  Location: [facility];  Service: General;  Laterality: Left;  . PORTACATH PLACEMENT N/A 04/21/2018   Procedure: PORT-A-CATH REVISION;  Surgeon: Stark Klein, MD;  [facility];  Service: General;  Laterality: N/A;  . TUBAL LIGATION    . WISDOM TOOTH EXTRACTION      There were no vitals filed for this visit.  Subjective Assessment - 01/17/19 1458    Subjective  I am just tired.  I didn't sleep well last night. I am okay to see if this can be the last day.    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 Chemotherapy completed 07/05/18, Herceptin started with chemo and continues through August of this year.  Radiation 07/27/18-09/06/18 to the Rt breast, Antiestrogens to follow. Other  medical history to include asthma, DM, HTN, HIV, and hysterectomy    Currently in Pain?  No/denies                  Outpatient Rehab from 07/27/2018 in [place]  Lymphedema Life Impact Scale Total Score  16.18 %           [facility] Adult PT Treatment/Exercise - 01/17/19 0001      Exercises   Exercises  Other Exercises    Other Exercises   Gave pt handout with current shoulder stretches and remedial exercises for the UE.  Performance of each x 10 and stretches 2x20"      Manual Therapy   Manual Lymphatic Drainage (MLD)  reviewed final MLD sequence and gave  pt new handout adding the upper arm to the sequence.                    PT Long Term Goals - 01/17/19 1504      PT LONG TERM GOAL #1   Title  Pt will improve Rt shoulder flexion from 150 to 165 to maximize function    Baseline  150    Status  Not Met      PT LONG TERM GOAL #2   Title  Pt will be independent in manual lymph drainage and use of compression for managment of breast and axillary edema     Baseline  ind with MLD; needs to get appointment for bras and has been measured for compression    Status  Partially Met      PT LONG TERM GOAL #3   Title  Pt will decrease Rt breast pain to intermittent only     Status  Achieved            Plan - 01/17/19 1507    Clinical Impression Statement  Pt reports she has been doing very well lately.  No breast pain, able to get her bras on and get dressed without arm pain, is now ind with her self MLD, will be getting bras and compression sleeve soon.  Pt still with some tightness inthe Rt axilla with overhead flexion but no daily notice of the stretch per patient.  Pt was given hopefully final MLD and exercise handout but will recheck with [place] in 2 weeks to make sure that the sleeve fits and that her upper arm has not gotten any larger.    PT Next Visit Plan  MLD? sleeve fit? bras? any more PT needs; redo measurements       Patient will benefit from skilled therapeutic intervention in order to improve the following deficits and impairments:     Visit Diagnosis: 1. Lymphedema, not elsewhere classified   2. Aftercare following surgery for neoplasm   3. Abnormal posture   4. Malignant neoplasm of upper-outer quadrant of right breast in female, estrogen receptor positive ([place])   5. Stiffness of right shoulder, not elsewhere classified        Problem List Patient Active Problem List   Diagnosis Date Noted  . Type 2 diabetes mellitus with other specified complication ([place]) 74/25/9563  . Hematochezia 03/30/2018  . Insomnia  03/30/2018  . Port-A-Cath in place 03/29/2018  . Genetic testing 01/30/2018  . Neoplasm of breast, regional lymph node staging category N3b: metastasis in ipsilateral internal mammary lymph node and axillary lymph node ([place]) 01/30/2018  . Family history of breast cancer   . Family history of colon cancer   .  Family history of lung cancer   . Diabetes mellitus type 2 without retinopathy ([place]) 01/18/2018  . Morbid obesity ([place]) 01/18/2018  . Malignant neoplasm of upper-outer quadrant of right breast in female, estrogen receptor positive ([place]) 01/17/2018  . Achilles tendinitis 11/28/2017  . Hot flashes 09/30/2017  . Hypertension 09/08/2017  . Contusion of left knee and lower leg 11/18/2015  . Mild intermittent asthma 08/15/2013  . BV (bacterial vaginosis) 08/11/2012  . Right knee pain 07/05/2012  . GERD (gastroesophageal reflux disease) 07/05/2012  . GC (gonococcus) 05/30/2012  . Chlamydia 05/30/2012  . Left knee pain 01/12/2012  . Cough 11/01/2011  . Encounter for long-term (current) use of high-risk medication 02/25/2011  . Preventative health care 02/14/2011  . VITAMIN D DEFICIENCY 01/30/2010  . ANEMIA-IRON DEFICIENCY 01/30/2010  . Edema 01/30/2010  . HIV (human immunodeficiency virus infection) ([place]) 03/12/2009  . GENITAL HERPES 03/12/2009  . Anxiety state 11/27/2007  . 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 Hyperlipidemia 08/02/2007    Shan Levans, PT 01/17/2019, 3:52 PM  [place], [name], [address] Phone: [phone]   Fax:  [phone]  Name: Kelly Rowe MRN: [id number] Date of Birth: Oct 21, 1970

## 2019-01-17 NOTE — Patient Instructions (Addendum)
Self manual lymph drainage:                                      [facility] [phone] Perform this sequence once a day.  Only give enough pressure no your skin to make the skin move.  Hug yourself.   1.) Do circles at your neck just above your collarbones.  Repeat this 10 times.  Diaphragmatic - Supine   2.) Inhale through nose making navel move out toward hands. Exhale through puckered lips, hands follow navel in.   Axilla - One at a Time   3.) Using full weight of flat hand and fingers at center of the left armpit, make _10__ in-place circles.   LEG: Inguinal Nodes Stimulation   4.) With small finger side of hand against hip crease on right side, gently perform circles at the crease. Repeat __10_ times.    1. Axilla to Inguinal Nodes - Sweep   5.) On involved side, sweep _4__ times from armpit along side of trunk to hip crease.  6.) Now gently stretch skin from the involved side to the uninvolved side across the chest at the shoulder line.  Repeat that 4 times.  7.) Draw an imaginary diagonal line from upper outer breast through the nipple area toward lower inner breast.  Direct fluid upward and inward from this line toward the pathway across your upper chest .  Do this in three rows to treat all of the upper inner breast tissue, and do each row 3-4x.      Direct fluid to treat all of lower outer breast tissue downward and outward toward pathway that is aimed at the left groin.            8.) After working on the breast move to the Right arm;   Move from the elbow to the shoulder, and then from the inside of the arm to the outside and back up the outside of the arm             Finish by doing the pathways as described above going from your involved armpit to the same side groin and going across your upper chest from the involved shoulder to the uninvolved shoulder.  Repeat the steps above where you do circles in your rightt groin and left  armpit.   Access Code: [email or web address]  URL: [email or web address]  Date: 01/17/2019  Prepared by: Shan Levans   Exercises  Supine Shoulder Flexion Extension AAROM with Dowel - 10 reps - 5 seconds hold - 1-2x daily - 7x weekly  Supine Chest Stretch with Elbows Bent - 3 reps - 1 sets - 30-60seconds hold - 1x daily - 7x weekly  Sawing with Compression Garment - 15 reps - 1 sets - 2x daily - 7x weekly  Scapular Retraction with Compression Garment - 15 reps - 1 sets - 2x daily - 7x weekly  Doorway Pec Stretch at 90 Degrees Abduction - 10 reps - 1-3 sets - 20-30 seconds hold - 1x daily - 7x weekly  Punch Up with Compression Garment - 15 reps - 1 sets - 2x daily - 7x weekly  Patient Education  Lymphedema: Exercise Considerations

## 2019-01-22 ENCOUNTER — Encounter: Payer: BC Managed Care – PPO | Admitting: Rehabilitation

## 2019-01-23 ENCOUNTER — Telehealth: Payer: Self-pay | Admitting: *Deleted

## 2019-01-23 NOTE — Telephone Encounter (Signed)
Patient called back and thought she had pap smear at 4/20 visit. I explained she did not and that was a problem visit. patient said okay.

## 2019-01-23 NOTE — Telephone Encounter (Signed)
Patient called and left message asking for pap copy, I called and left message for pt to call.

## 2019-01-24 ENCOUNTER — Encounter: Payer: Self-pay | Admitting: Dietician

## 2019-01-24 ENCOUNTER — Encounter: Payer: BC Managed Care – PPO | Admitting: Rehabilitation

## 2019-01-24 ENCOUNTER — Encounter: Payer: BC Managed Care – PPO | Attending: Internal Medicine | Admitting: Dietician

## 2019-01-24 ENCOUNTER — Other Ambulatory Visit: Payer: Self-pay

## 2019-01-24 DIAGNOSIS — E1169 Type 2 diabetes mellitus with other specified complication: Secondary | ICD-10-CM | POA: Insufficient documentation

## 2019-01-24 DIAGNOSIS — E08319 Diabetes mellitus due to underlying condition with unspecified diabetic retinopathy without macular edema: Secondary | ICD-10-CM

## 2019-01-24 NOTE — Patient Instructions (Addendum)
-   Try stretching at night before bed to wind down. Work on going to bed earlier and earlier and try to eventually go to bed at the same time each night.  - If you need a nighttime snack, try snacking on sunflower seeds, pistachios, or tart cherries to help promote relaxation and sleep. Kelly Rowe would be a good dinner option too!  - Use the Meal Ideas sheet to put together balanced lunches/dinners.   See you in about 2 months!

## 2019-01-24 NOTE — Progress Notes (Signed)
Diabetes Self-Management Education  Visit Type: Follow-up  Appt. Start Time: 2:05pm Appt. End Time: 2:35pm  01/24/2019  [name], identified by name and date of birth, is a 48 y.o. female with a diagnosis of Diabetes:  .   ASSESSMENT  Weight 256 lb 8 oz (116.3 kg) Body mass index is 46.91 kg/m.    Body Composition Scale 12/13/2018 01/24/2019  Total Body Fat % 46.8 47.6  Visceral Fat 18 19  Fat-Free Mass % 53.1 52.3   Total Body Water % 41 40.6   Muscle-Mass lbs 29.7 29.7  Body Fat Displacement           Torso  lbs 72.2 75.7         Left Leg  lbs 14.4 15.1         Right Leg  lbs 14.4 15.1         Left Arm  lbs 7.2 7.5         Right Arm   lbs 7.2 7.5    Diabetes Self-Management Education - 01/24/19 1416      Visit Information   Visit Type  Follow-up      Complications   How often do you check your blood sugar?  1-2 times/day    Fasting Blood glucose range (mg/dL)  70-129    Postprandial Blood glucose range (mg/dL)  130-179      Dietary Intake   Breakfast  raisin bran + banana + water    Snack (morning)  [employer] protein bar    Lunch  4 piece kids meal + apples + small fry   or 2 slices supreme pizza + water   Snack (afternoon)  kiwi    Dinner  fish fillet sandwich w/ no bread + fries + water    Snack (evening)  10 Oreos + frosty    Beverage(s)  water      Individualized Goals (developed by patient)   Nutrition  General guidelines for healthy choices and portions discussed    Medications  take my medication as prescribed    Monitoring   test my blood glucose as discussed    Health Coping  Other (comment)   Establish bedtime routine and sleeping pattern.     Patient Self-Evaluation of Goals - Patient rates self as meeting previously set goals (% of time)   Nutrition  25 - 50%    Physical Activity  < 25%      Post-Education Assessment   Patient understands the diabetes disease and treatment process.  Needs Review    Patient understands  incorporating nutritional management into lifestyle.  Needs Review    Patient undertands incorporating physical activity into lifestyle.  Needs Review    Patient understands using medications safely.  Needs Review    Patient understands monitoring blood glucose, interpreting and using results  Needs Review    Patient understands prevention, detection, and treatment of acute complications.  Needs Review    Patient understands prevention, detection, and treatment of chronic complications.  Needs Review    Patient understands how to develop strategies to address psychosocial issues.  Needs Review    Patient understands how to develop strategies to promote health/change behavior.  Needs Review      Outcomes   Expected Outcomes  Demonstrated interest in learning. Expect positive outcomes    Future DMSE  2 months    Program Status  Not Completed      Subsequent Visit   Since your last visit have  you continued or begun to take your medications as prescribed?  Yes    Since your last visit have you had your blood pressure checked?  Yes    Is your most recent blood pressure lower, unchanged, or higher since your last visit?  Lower    Since your last visit, are you checking your blood glucose at least once a day?  Yes       Individualized Plan for Diabetes Self-Management Training:  Learning Objective:  Patient will have a greater understanding of diabetes self-management. Patient education plan is to attend individual and/or group sessions per assessed needs and concerns.  Pt states she does not sleep well at night. Stays up late, sleeps in until about 11am. States she watches TV late at night while snacking on "not even real food, but junk food." States she has not been meeting her physical activity goal because by the time she wakes up, it is already hot outside and she doesn't want to walk in the heat. Today's visit focused on sleep and establishing a good nighttime routine and sleep schedule.  Late night snacking seems to interfere with adequate sleep, and sleeping in until it is too hot outside seems to interfere with preferred method of physical activity. Discussed appropriate nighttime snack options, as well as balanced meals to help incorporate more plant foods such as whole grains, vegetables, and fruits (--- fiber!) Pt asked about good fruits to eat, so we discussed why color is important as well as eating whole fruits, ideally those with skins (berries, apples, pears, etc.) Focused on promoting balanced intake by including all food groups and using the Meal Ideas sheet to help create balanced meals.   Improvements: portion control (ex: ordering the kid's meal with fruit on the side) and drinking lots of water.  Areas to improve: adequate sleep, consistent/increased weekly physical activity, vegetable intake, balanced meals.    Plan:  Patient Instructions  - Try stretching at night before bed to wind down. Work on going to bed earlier and earlier and try to eventually go to bed at the same time each night.  - If you need a nighttime snack, try snacking on sunflower seeds, pistachios, or tart cherries to help promote relaxation and sleep. Dani Gobble would be a good dinner option too!  - Use the Meal Ideas sheet to put together balanced lunches/dinners.   See you in about 2 months!  Goals:   Aim to eat vegetables twice a day. (Meeting 50% of the time. Continue)   Aim to continue getting physical activity 30 minutes/day at least 3 days/week. (Meeting <25% of the time. Continue)   NEW: Stretch before bedtime. Aim to go to bed earlier and earlier to eventually get to bed at the same time every night. If you need a snack, try pistachios, sunflower seeds, or tart cherries.   NEW: Eat at least 1 balanced meal per day  (include complex carb, protein, and vegetable.)    Expected Outcomes:  Demonstrated interest in learning. Expect positive outcomes  Education material provided: My  Plate, Meal Ideas, NDPP "Get Enough Sleep" module (p.4-7)  RD's Notes for Next Visit:   Chair exercises/ alternate physical activity ideas   More consistent routine for BG checks   Healthy fast food ideas   If problems or questions, patient to contact team via:  Phone and Email  Future DSME appointment: 2 months

## 2019-01-29 ENCOUNTER — Inpatient Hospital Stay: Payer: BC Managed Care – PPO

## 2019-01-29 ENCOUNTER — Telehealth: Payer: Self-pay | Admitting: *Deleted

## 2019-01-29 ENCOUNTER — Inpatient Hospital Stay: Payer: BC Managed Care – PPO | Attending: Oncology

## 2019-01-29 ENCOUNTER — Other Ambulatory Visit: Payer: Self-pay

## 2019-01-29 VITALS — BP 120/73 | HR 72 | Temp 97.8°F | Resp 18

## 2019-01-29 DIAGNOSIS — Z5112 Encounter for antineoplastic immunotherapy: Secondary | ICD-10-CM | POA: Diagnosis present

## 2019-01-29 DIAGNOSIS — Z9221 Personal history of antineoplastic chemotherapy: Secondary | ICD-10-CM | POA: Insufficient documentation

## 2019-01-29 DIAGNOSIS — Z8042 Family history of malignant neoplasm of prostate: Secondary | ICD-10-CM | POA: Diagnosis not present

## 2019-01-29 DIAGNOSIS — Z801 Family history of malignant neoplasm of trachea, bronchus and lung: Secondary | ICD-10-CM | POA: Diagnosis not present

## 2019-01-29 DIAGNOSIS — C50411 Malignant neoplasm of upper-outer quadrant of right female breast: Secondary | ICD-10-CM | POA: Insufficient documentation

## 2019-01-29 DIAGNOSIS — C773 Secondary and unspecified malignant neoplasm of axilla and upper limb lymph nodes: Secondary | ICD-10-CM

## 2019-01-29 DIAGNOSIS — Z803 Family history of malignant neoplasm of breast: Secondary | ICD-10-CM | POA: Diagnosis not present

## 2019-01-29 DIAGNOSIS — Z923 Personal history of irradiation: Secondary | ICD-10-CM | POA: Diagnosis not present

## 2019-01-29 DIAGNOSIS — Z170 Estrogen receptor positive status [ER+]: Secondary | ICD-10-CM | POA: Diagnosis not present

## 2019-01-29 DIAGNOSIS — R232 Flushing: Secondary | ICD-10-CM | POA: Insufficient documentation

## 2019-01-29 DIAGNOSIS — Z800 Family history of malignant neoplasm of digestive organs: Secondary | ICD-10-CM | POA: Insufficient documentation

## 2019-01-29 DIAGNOSIS — Z79899 Other long term (current) drug therapy: Secondary | ICD-10-CM | POA: Diagnosis not present

## 2019-01-29 DIAGNOSIS — Z8249 Family history of ischemic heart disease and other diseases of the circulatory system: Secondary | ICD-10-CM | POA: Insufficient documentation

## 2019-01-29 DIAGNOSIS — Z823 Family history of stroke: Secondary | ICD-10-CM | POA: Diagnosis not present

## 2019-01-29 DIAGNOSIS — Z95828 Presence of other vascular implants and grafts: Secondary | ICD-10-CM

## 2019-01-29 DIAGNOSIS — Z833 Family history of diabetes mellitus: Secondary | ICD-10-CM | POA: Insufficient documentation

## 2019-01-29 DIAGNOSIS — C50911 Malignant neoplasm of unspecified site of right female breast: Secondary | ICD-10-CM

## 2019-01-29 LAB — CBC WITH DIFFERENTIAL (CANCER CENTER ONLY)
Abs Immature Granulocytes: 0.02 10*3/uL (ref 0.00–0.07)
Basophils Absolute: 0 10*3/uL (ref 0.0–0.1)
Basophils Relative: 0 %
Eosinophils Absolute: 0 10*3/uL (ref 0.0–0.5)
Eosinophils Relative: 1 %
HCT: 37.3 % (ref 36.0–46.0)
Hemoglobin: 11.6 g/dL — ABNORMAL LOW (ref 12.0–15.0)
Immature Granulocytes: 1 %
Lymphocytes Relative: 28 %
Lymphs Abs: 1 10*3/uL (ref 0.7–4.0)
MCH: 27.3 pg (ref 26.0–34.0)
MCHC: 31.1 g/dL (ref 30.0–36.0)
MCV: 87.8 fL (ref 80.0–100.0)
Monocytes Absolute: 0.3 10*3/uL (ref 0.1–1.0)
Monocytes Relative: 9 %
Neutro Abs: 2.2 10*3/uL (ref 1.7–7.7)
Neutrophils Relative %: 61 %
Platelet Count: 214 10*3/uL (ref 150–400)
RBC: 4.25 MIL/uL (ref 3.87–5.11)
RDW: 14.5 % (ref 11.5–15.5)
WBC Count: 3.6 10*3/uL — ABNORMAL LOW (ref 4.0–10.5)
nRBC: 0 % (ref 0.0–0.2)

## 2019-01-29 LAB — CMP (CANCER CENTER ONLY)
ALT: 20 U/L (ref 0–44)
AST: 18 U/L (ref 15–41)
Albumin: 3.2 g/dL — ABNORMAL LOW (ref 3.5–5.0)
Alkaline Phosphatase: 115 U/L (ref 38–126)
Anion gap: 9 (ref 5–15)
BUN: 11 mg/dL (ref 6–20)
CO2: 27 mmol/L (ref 22–32)
Calcium: 8.7 mg/dL — ABNORMAL LOW (ref 8.9–10.3)
Chloride: 103 mmol/L (ref 98–111)
Creatinine: 0.84 mg/dL (ref 0.44–1.00)
GFR, Est AFR Am: >60 mL/min (ref >60)
GFR, Estimated: >60 mL/min (ref >60)
Glucose, Bld: 132 mg/dL — ABNORMAL HIGH (ref 70–99)
Potassium: 3.7 mmol/L (ref 3.5–5.1)
Sodium: 139 mmol/L (ref 135–145)
Total Bilirubin: 0.2 mg/dL — ABNORMAL LOW (ref 0.3–1.2)
Total Protein: 7.2 g/dL (ref 6.5–8.1)

## 2019-01-29 MED ORDER — TRASTUZUMAB CHEMO 150 MG IV SOLR
6.0000 mg/kg | Freq: Once | INTRAVENOUS | Status: AC
  Administered 2019-01-29: 714 mg via INTRAVENOUS
  Filled 2019-01-29: qty 34

## 2019-01-29 MED ORDER — DIPHENHYDRAMINE HCL 25 MG PO CAPS
ORAL_CAPSULE | ORAL | Status: AC
  Filled 2019-01-29: qty 1

## 2019-01-29 MED ORDER — SODIUM CHLORIDE 0.9 % IV SOLN
Freq: Once | INTRAVENOUS | Status: AC
  Administered 2019-01-29: 15:00:00 via INTRAVENOUS
  Filled 2019-01-29: qty 250

## 2019-01-29 MED ORDER — DIPHENHYDRAMINE HCL 25 MG PO CAPS
25.0000 mg | ORAL_CAPSULE | Freq: Once | ORAL | Status: AC
  Administered 2019-01-29: 25 mg via ORAL

## 2019-01-29 MED ORDER — HEPARIN SOD (PORK) LOCK FLUSH 100 UNIT/ML IV SOLN
500.0000 [IU] | Freq: Once | INTRAVENOUS | Status: AC | PRN
  Administered 2019-01-29: 500 [IU]
  Filled 2019-01-29: qty 5

## 2019-01-29 MED ORDER — ACETAMINOPHEN 325 MG PO TABS
ORAL_TABLET | ORAL | Status: AC
  Filled 2019-01-29: qty 2

## 2019-01-29 MED ORDER — ACETAMINOPHEN 325 MG PO TABS
650.0000 mg | ORAL_TABLET | Freq: Once | ORAL | Status: AC
  Administered 2019-01-29: 650 mg via ORAL

## 2019-01-29 MED ORDER — SODIUM CHLORIDE 0.9% FLUSH
10.0000 mL | Freq: Once | INTRAVENOUS | Status: AC
  Administered 2019-01-29: 10 mL
  Filled 2019-01-29: qty 10

## 2019-01-29 MED ORDER — SODIUM CHLORIDE 0.9% FLUSH
10.0000 mL | INTRAVENOUS | Status: DC | PRN
  Administered 2019-01-29: 10 mL
  Filled 2019-01-29: qty 10

## 2019-01-29 NOTE — Telephone Encounter (Signed)
Per MD proceed with herceptin today with next echo scheduled 01/31/2019.

## 2019-01-29 NOTE — Patient Instructions (Signed)
[facility] Discharge Instructions for Patients Receiving Chemotherapy  Today you received the following chemotherapy agents :  Herceptin.  To help prevent nausea and vomiting after your treatment, we encourage you to take your nausea medication as prescribed.  WED  01/31/2019  AT  1115 AM  GO TO [facility] FOR ECHOCARDIOGRAM. GARAGE CODE PARKING  IS  9009 - THEN GO TO ENTRANCE  C.   If you develop nausea and vomiting that is not controlled by your nausea medication, call the clinic.   BELOW ARE SYMPTOMS THAT SHOULD BE REPORTED IMMEDIATELY:  *FEVER GREATER THAN 100.5 F  *CHILLS WITH OR WITHOUT FEVER  NAUSEA AND VOMITING THAT IS NOT CONTROLLED WITH YOUR NAUSEA MEDICATION  *UNUSUAL SHORTNESS OF BREATH  *UNUSUAL BRUISING OR BLEEDING  TENDERNESS IN MOUTH AND THROAT WITH OR WITHOUT PRESENCE OF ULCERS  *URINARY PROBLEMS  *BOWEL PROBLEMS  UNUSUAL RASH Items with * indicate a potential emergency and should be followed up as soon as possible.  Feel free to call the clinic should you have any questions or concerns. The clinic phone number is [phone].  Please show the [facility] at check-in to the Emergency Department and triage nurse.

## 2019-01-31 ENCOUNTER — Other Ambulatory Visit: Payer: Self-pay

## 2019-01-31 ENCOUNTER — Ambulatory Visit (HOSPITAL_COMMUNITY)
Admission: RE | Admit: 2019-01-31 | Discharge: 2019-01-31 | Disposition: A | Discharge status: Home / Self Care | Payer: BC Managed Care – PPO | Source: Ambulatory Visit | Attending: Cardiology | Admitting: Cardiology

## 2019-01-31 ENCOUNTER — Encounter: Payer: Self-pay | Admitting: Rehabilitation

## 2019-01-31 ENCOUNTER — Ambulatory Visit: Payer: BC Managed Care – PPO | Attending: General Surgery | Admitting: Rehabilitation

## 2019-01-31 DIAGNOSIS — C773 Secondary and unspecified malignant neoplasm of axilla and upper limb lymph nodes: Secondary | ICD-10-CM | POA: Insufficient documentation

## 2019-01-31 DIAGNOSIS — I890 Lymphedema, not elsewhere classified: Secondary | ICD-10-CM | POA: Insufficient documentation

## 2019-01-31 DIAGNOSIS — B20 Human immunodeficiency virus [HIV] disease: Secondary | ICD-10-CM | POA: Insufficient documentation

## 2019-01-31 DIAGNOSIS — E785 Hyperlipidemia, unspecified: Secondary | ICD-10-CM | POA: Diagnosis not present

## 2019-01-31 DIAGNOSIS — C50911 Malignant neoplasm of unspecified site of right female breast: Secondary | ICD-10-CM | POA: Insufficient documentation

## 2019-01-31 DIAGNOSIS — I081 Rheumatic disorders of both mitral and tricuspid valves: Secondary | ICD-10-CM | POA: Diagnosis not present

## 2019-01-31 DIAGNOSIS — E119 Type 2 diabetes mellitus without complications: Secondary | ICD-10-CM | POA: Insufficient documentation

## 2019-01-31 DIAGNOSIS — I10 Essential (primary) hypertension: Secondary | ICD-10-CM | POA: Insufficient documentation

## 2019-01-31 NOTE — Therapy (Signed)
[place], [name], [address] Phone: [phone]   Fax:  [phone]  Physical Therapy Treatment  Patient Details  Name: Kelly Rowe MRN: [id number] Date of Birth: [birth year] Referring Provider (PT): Dr. Barry Dienes    Encounter Date: 01/31/2019  PT End of Session - 01/31/19 1421    PT Start Time  1407    PT Stop Time  1417    PT Time Calculation (min)  10 min       Past Medical History:  Diagnosis Date  . ANEMIA-IRON DEFICIENCY 01/30/2010  . ANXIETY 11/27/2007  . Arthritis   . Asthma 02/25/2011  . Cancer ([place])    breast  . Carbuncle and furuncle of trunk 04/16/2010  . Chlamydia infection 03/21/2008  . DIABETES MELLITUS, TYPE II 08/02/2007  . Edema 01/30/2010  . ELEVATED BLOOD PRESSURE WITHOUT DIAGNOSIS OF HYPERTENSION 08/02/2007  . Family history of breast cancer   . Family history of colon cancer   . Family history of lung cancer   . FREQUENCY, URINARY 05/19/2009  . GENITAL HERPES 03/12/2009  . GERD (gastroesophageal reflux disease)   . History of kidney stones   . History of radiation therapy 07/27/18- 09/06/18   Right Breast, Right Axillary and Supraclavicular nodes 50 Gy in 25 fractions, Right Breast Boost 10 Gy in 5 fractions  . HIV (human immunodeficiency virus infection) ([facility]) 2009  . HIV INFECTION 03/12/2009  . HSV (herpes simplex virus) infection   . HYPERLIPIDEMIA 08/02/2007  . Hypertension   . Metrorrhagia 03/21/2008  . PONV (postoperative nausea and vomiting)    woke up crying per patient   . RETENTION, URINE 05/19/2009  . Trichomonas infection 01/19/2010  . VITAMIN D DEFICIENCY 01/30/2010    Past Surgical History:  Procedure Laterality Date  . ABDOMINAL HYSTERECTOMY  07/22/2010   TAH WITH PRESERVATION OF BOTH TUBES AND OVARIES  . BREAST LUMPECTOMY WITH RADIOACTIVE SEED AND SENTINEL LYMPH NODE BIOPSY Right 02/23/2018   Procedure: BREAST LUMPECTOMY WITH RADIOACTIVE SEED AND SENTINEL LYMPH  NODE BIOPSY;  Surgeon: Stark Klein, MD;  Location: [facility];  Service: General;  Laterality: Right;  . CHOLECYSTECTOMY    . ENDOMETRIAL ABLATION  01/11/2008   HER OPTION  . ESOPHAGOGASTRODUODENOSCOPY    . MULTIPLE TOOTH EXTRACTIONS    . PORTACATH PLACEMENT Left 02/23/2018   Procedure: INSERTION PORT-A-CATH;  Surgeon: Stark Klein, MD;  Location: [facility];  Service: General;  Laterality: Left;  . PORTACATH PLACEMENT N/A 04/21/2018   Procedure: PORT-A-CATH REVISION;  Surgeon: Stark Klein, MD;  Location: [facility];  Service: General;  Laterality: N/A;  . TUBAL LIGATION    . WISDOM TOOTH EXTRACTION      There were no vitals filed for this visit.  Subjective Assessment - 01/31/19 1420    Subjective  Pt arrives reporting her sleeve never came and thought she was going to get it here.  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 Chemotherapy completed 07/05/18, Herceptin started with chemo and continues through August of this year.  Radiation 07/27/18-09/06/18 to the Rt breast, Antiestrogens to follow. Other medical history to include asthma, DM, HTN, HIV, and hysterectomy    Currently in Pain?  No/denies                  Outpatient Rehab from 07/27/2018  [facility]  Lymphedema Life Impact Scale Total Score  16.18 %                        PT Long Term Goals - 01/17/19 1504      PT LONG TERM GOAL #1   Title  Pt will improve Rt shoulder flexion from 150 to 165 to maximize function    Baseline  150    Status  Not Met      PT LONG TERM GOAL #2   Title  Pt will be independent in manual lymph drainage and use of compression for managment of breast and axillary edema     Baseline  ind with MLD; needs to get appointment for bras and has been measured for compression    Status  Partially Met       PT LONG TERM GOAL #3   Title  Pt will decrease Rt breast pain to intermittent only     Status  Achieved            Plan - 01/31/19 1421    Clinical Impression Statement  no visit charge today.  pt arrived without compression sleeve to check thinking it would be here.  Called [facility] who said they have been behind on benefits checking but would put a rush on it within the hour.  Pt will call and schedule anew appt when her sleeve arrives.       Patient will benefit from skilled therapeutic intervention in order to improve the following deficits and impairments:     Visit Diagnosis: No diagnosis found.     Problem List Patient Active Problem List   Diagnosis Date Noted  . Type 2 diabetes mellitus with other specified complication ([facility]) 85/63/1497  . Hematochezia 03/30/2018  . Insomnia 03/30/2018  . Port-A-Cath in place 03/29/2018  . Genetic testing 01/30/2018  . Neoplasm of breast, regional lymph node staging category N3b: metastasis in ipsilateral internal mammary lymph node and axillary lymph node ([facility]) 01/30/2018  . Family history of breast cancer   . Family history of colon cancer   . Family history of lung cancer   . Diabetes mellitus type 2 without retinopathy ([place]) 01/18/2018  . Morbid obesity ([facility]) 01/18/2018  . Malignant neoplasm of upper-outer quadrant of right breast in female, estrogen receptor positive ([facility] [facility]) 01/17/2018  . Achilles tendinitis 11/28/2017  . Hot flashes 09/30/2017  . Hypertension 09/08/2017  . Contusion of left knee and lower leg 11/18/2015  . Mild intermittent asthma 08/15/2013  . BV (bacterial vaginosis) 08/11/2012  . Right knee pain 07/05/2012  . GERD (gastroesophageal reflux disease) 07/05/2012  . GC (gonococcus) 05/30/2012  . Chlamydia 05/30/2012  . Left knee pain 01/12/2012  . Cough 11/01/2011  . Encounter for long-term (current) use of high-risk medication 02/25/2011  . Preventative health care 02/14/2011  . VITAMIN D DEFICIENCY  01/30/2010  . ANEMIA-IRON DEFICIENCY 01/30/2010  . Edema 01/30/2010  . HIV (human immunodeficiency virus infection) ([facility]) 03/12/2009  . GENITAL HERPES 03/12/2009  . Anxiety state 11/27/2007  . 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 Hyperlipidemia 08/02/2007    Stark Bray 01/31/2019, 2:23 PM  [place], [name], [address] Phone: [phone]   Fax:  [phone]  Name: ROBYNN MARCEL MRN: [id number] Date of Birth: Feb 14, 1971

## 2019-01-31 NOTE — Progress Notes (Signed)
*  PRELIMINARY RESULTS* Echocardiogram 2D Echocardiogram has been performed.  01/31/2019, 11:45 AM

## 2019-02-06 ENCOUNTER — Telehealth (HOSPITAL_COMMUNITY): Payer: Self-pay

## 2019-02-06 NOTE — Telephone Encounter (Signed)
-----   Message from Larey Dresser, MD sent at 02/01/2019  2:58 PM EDT ----- EF 55-60% with mild MR

## 2019-02-06 NOTE — Telephone Encounter (Signed)
Pt aware of results and appreciative.  

## 2019-02-06 NOTE — Progress Notes (Signed)
She can restart Herceptin with echo again in 6 wks.  She will need to see me in the office on the day of the echo.

## 2019-02-07 ENCOUNTER — Telehealth (HOSPITAL_COMMUNITY): Payer: Self-pay

## 2019-02-07 ENCOUNTER — Other Ambulatory Visit: Payer: Self-pay | Admitting: Oncology

## 2019-02-07 DIAGNOSIS — T451X5A Adverse effect of antineoplastic and immunosuppressive drugs, initial encounter: Secondary | ICD-10-CM

## 2019-02-07 DIAGNOSIS — I427 Cardiomyopathy due to drug and external agent: Secondary | ICD-10-CM

## 2019-02-07 NOTE — Telephone Encounter (Signed)
-----   Message from Larey Dresser, MD sent at 02/06/2019 10:24 PM EDT ----- She can restart Herceptin, EF back to normal range.  She will need to followup with me in 6 weeks with echo.

## 2019-02-07 NOTE — Telephone Encounter (Signed)
Ordered for echo and forwarded to scheduler to arrange appts in 6 weeks

## 2019-02-07 NOTE — Progress Notes (Signed)
EF back to normal, may resume Herceptin per Dr. Aundra Dubin

## 2019-02-11 ENCOUNTER — Other Ambulatory Visit: Payer: Self-pay | Admitting: Internal Medicine

## 2019-02-11 ENCOUNTER — Other Ambulatory Visit (HOSPITAL_COMMUNITY): Payer: Self-pay | Admitting: Cardiology

## 2019-02-12 NOTE — Telephone Encounter (Signed)
Done erx 

## 2019-02-19 ENCOUNTER — Other Ambulatory Visit: Payer: Self-pay

## 2019-02-19 ENCOUNTER — Inpatient Hospital Stay: Payer: BC Managed Care – PPO

## 2019-02-19 VITALS — BP 115/66 | HR 80 | Temp 98.3°F | Resp 16

## 2019-02-19 DIAGNOSIS — Z170 Estrogen receptor positive status [ER+]: Secondary | ICD-10-CM

## 2019-02-19 DIAGNOSIS — C50411 Malignant neoplasm of upper-outer quadrant of right female breast: Secondary | ICD-10-CM

## 2019-02-19 DIAGNOSIS — Z95828 Presence of other vascular implants and grafts: Secondary | ICD-10-CM

## 2019-02-19 DIAGNOSIS — C773 Secondary and unspecified malignant neoplasm of axilla and upper limb lymph nodes: Secondary | ICD-10-CM

## 2019-02-19 DIAGNOSIS — C50911 Malignant neoplasm of unspecified site of right female breast: Secondary | ICD-10-CM

## 2019-02-19 LAB — CMP (CANCER CENTER ONLY)
ALT: 17 U/L (ref 0–44)
AST: 17 U/L (ref 15–41)
Albumin: 3.3 g/dL — ABNORMAL LOW (ref 3.5–5.0)
Alkaline Phosphatase: 116 U/L (ref 38–126)
Anion gap: 9 (ref 5–15)
BUN: 12 mg/dL (ref 6–20)
CO2: 27 mmol/L (ref 22–32)
Calcium: 9.1 mg/dL (ref 8.9–10.3)
Chloride: 102 mmol/L (ref 98–111)
Creatinine: 0.92 mg/dL (ref 0.44–1.00)
GFR, Est AFR Am: >60 mL/min (ref >60)
GFR, Estimated: >60 mL/min (ref >60)
Glucose, Bld: 179 mg/dL — ABNORMAL HIGH (ref 70–99)
Potassium: 4 mmol/L (ref 3.5–5.1)
Sodium: 138 mmol/L (ref 135–145)
Total Bilirubin: 0.2 mg/dL — ABNORMAL LOW (ref 0.3–1.2)
Total Protein: 7.2 g/dL (ref 6.5–8.1)

## 2019-02-19 LAB — CBC WITH DIFFERENTIAL (CANCER CENTER ONLY)
Abs Immature Granulocytes: 0.02 10*3/uL (ref 0.00–0.07)
Basophils Absolute: 0 10*3/uL (ref 0.0–0.1)
Basophils Relative: 0 %
Eosinophils Absolute: 0 10*3/uL (ref 0.0–0.5)
Eosinophils Relative: 1 %
HCT: 37.1 % (ref 36.0–46.0)
Hemoglobin: 11.7 g/dL — ABNORMAL LOW (ref 12.0–15.0)
Immature Granulocytes: 1 %
Lymphocytes Relative: 32 %
Lymphs Abs: 1.1 10*3/uL (ref 0.7–4.0)
MCH: 27.9 pg (ref 26.0–34.0)
MCHC: 31.5 g/dL (ref 30.0–36.0)
MCV: 88.3 fL (ref 80.0–100.0)
Monocytes Absolute: 0.3 10*3/uL (ref 0.1–1.0)
Monocytes Relative: 9 %
Neutro Abs: 2 10*3/uL (ref 1.7–7.7)
Neutrophils Relative %: 57 %
Platelet Count: 205 10*3/uL (ref 150–400)
RBC: 4.2 MIL/uL (ref 3.87–5.11)
RDW: 14.4 % (ref 11.5–15.5)
WBC Count: 3.4 10*3/uL — ABNORMAL LOW (ref 4.0–10.5)
nRBC: 0 % (ref 0.0–0.2)

## 2019-02-19 MED ORDER — TRASTUZUMAB CHEMO 150 MG IV SOLR
6.0000 mg/kg | Freq: Once | INTRAVENOUS | Status: AC
  Administered 2019-02-19: 714 mg via INTRAVENOUS
  Filled 2019-02-19: qty 34

## 2019-02-19 MED ORDER — SODIUM CHLORIDE 0.9% FLUSH
10.0000 mL | Freq: Once | INTRAVENOUS | Status: AC
  Administered 2019-02-19: 10 mL
  Filled 2019-02-19: qty 10

## 2019-02-19 MED ORDER — ACETAMINOPHEN 325 MG PO TABS
650.0000 mg | ORAL_TABLET | Freq: Once | ORAL | Status: AC
  Administered 2019-02-19: 650 mg via ORAL

## 2019-02-19 MED ORDER — DIPHENHYDRAMINE HCL 25 MG PO CAPS
25.0000 mg | ORAL_CAPSULE | Freq: Once | ORAL | Status: AC
  Administered 2019-02-19: 25 mg via ORAL

## 2019-02-19 MED ORDER — HEPARIN SOD (PORK) LOCK FLUSH 100 UNIT/ML IV SOLN
500.0000 [IU] | Freq: Once | INTRAVENOUS | Status: AC | PRN
  Administered 2019-02-19: 500 [IU]
  Filled 2019-02-19: qty 5

## 2019-02-19 MED ORDER — DIPHENHYDRAMINE HCL 25 MG PO CAPS
ORAL_CAPSULE | ORAL | Status: AC
  Filled 2019-02-19: qty 1

## 2019-02-19 MED ORDER — ACETAMINOPHEN 325 MG PO TABS
ORAL_TABLET | ORAL | Status: AC
  Filled 2019-02-19: qty 2

## 2019-02-19 MED ORDER — SODIUM CHLORIDE 0.9 % IV SOLN
Freq: Once | INTRAVENOUS | Status: AC
  Administered 2019-02-19: 14:00:00 via INTRAVENOUS
  Filled 2019-02-19: qty 250

## 2019-02-19 MED ORDER — SODIUM CHLORIDE 0.9% FLUSH
10.0000 mL | INTRAVENOUS | Status: DC | PRN
  Administered 2019-02-19: 10 mL
  Filled 2019-02-19: qty 10

## 2019-02-19 NOTE — Patient Instructions (Signed)
[facility] Discharge Instructions for Patients Receiving Chemotherapy  Today you received the following chemotherapy agents Herceptin  To help prevent nausea and vomiting after your treatment, we encourage you to take your nausea medication as directed   If you develop nausea and vomiting that is not controlled by your nausea medication, call the clinic.   BELOW ARE SYMPTOMS THAT SHOULD BE REPORTED IMMEDIATELY:  *FEVER GREATER THAN 100.5 F  *CHILLS WITH OR WITHOUT FEVER  NAUSEA AND VOMITING THAT IS NOT CONTROLLED WITH YOUR NAUSEA MEDICATION  *UNUSUAL SHORTNESS OF BREATH  *UNUSUAL BRUISING OR BLEEDING  TENDERNESS IN MOUTH AND THROAT WITH OR WITHOUT PRESENCE OF ULCERS  *URINARY PROBLEMS  *BOWEL PROBLEMS  UNUSUAL RASH Items with * indicate a potential emergency and should be followed up as soon as possible.  Feel free to call the clinic should you have any questions or concerns. The clinic phone number is [phone].  Please show the CHEMO ALERT CARD at check-in to the Emergency Department and triage nurse.   

## 2019-02-21 ENCOUNTER — Ambulatory Visit: Payer: BC Managed Care – PPO | Admitting: Rehabilitation

## 2019-02-21 ENCOUNTER — Other Ambulatory Visit: Payer: Self-pay

## 2019-02-21 ENCOUNTER — Encounter: Payer: Self-pay | Admitting: Rehabilitation

## 2019-02-21 DIAGNOSIS — I890 Lymphedema, not elsewhere classified: Secondary | ICD-10-CM | POA: Diagnosis not present

## 2019-02-21 NOTE — Therapy (Signed)
[place], [name], [address] Phone: [phone]   Fax:  [phone]  Physical Therapy Treatment  Patient Details  Name: Kelly Rowe MRN: [id number] Date of Birth: 03/22/1971 Referring Provider (PT): Dr. Barry Dienes    Encounter Date: 02/21/2019  PT End of Session - 02/21/19 1444    Visit Number  7    Number of Visits  9    PT Start Time  1402    PT Stop Time  1435    PT Time Calculation (min)  33 min    Activity Tolerance  Patient tolerated treatment well    Behavior During Therapy  [facility] for tasks assessed/performed       Past Medical History:  Diagnosis Date  . ANEMIA-IRON DEFICIENCY 01/30/2010  . ANXIETY 11/27/2007  . Arthritis   . Asthma 02/25/2011  . Cancer ([place])    breast  . Carbuncle and furuncle of trunk 04/16/2010  . Chlamydia infection 03/21/2008  . DIABETES MELLITUS, TYPE II 08/02/2007  . Edema 01/30/2010  . ELEVATED BLOOD PRESSURE WITHOUT DIAGNOSIS OF HYPERTENSION 08/02/2007  . Family history of breast cancer   . Family history of colon cancer   . Family history of lung cancer   . FREQUENCY, URINARY 05/19/2009  . GENITAL HERPES 03/12/2009  . GERD (gastroesophageal reflux disease)   . History of kidney stones   . History of radiation therapy 07/27/18- 09/06/18   Right Breast, Right Axillary and Supraclavicular nodes 50 Gy in 25 fractions, Right Breast Boost 10 Gy in 5 fractions  . HIV (human immunodeficiency virus infection) ([place]) 2009  . HIV INFECTION 03/12/2009  . HSV (herpes simplex virus) infection   . HYPERLIPIDEMIA 08/02/2007  . Hypertension   . Metrorrhagia 03/21/2008  . PONV (postoperative nausea and vomiting)    woke up crying per patient   . RETENTION, URINE 05/19/2009  . Trichomonas infection 01/19/2010  . VITAMIN D DEFICIENCY 01/30/2010    Past Surgical History:  Procedure Laterality Date  . ABDOMINAL HYSTERECTOMY  07/22/2010   TAH WITH PRESERVATION OF BOTH TUBES AND OVARIES   . BREAST LUMPECTOMY WITH RADIOACTIVE SEED AND SENTINEL LYMPH NODE BIOPSY Right 02/23/2018   Procedure: BREAST LUMPECTOMY WITH RADIOACTIVE SEED AND SENTINEL LYMPH NODE BIOPSY;  Surgeon: Stark Klein, MD;  Location: [place];  Service: General;  Laterality: Right;  . CHOLECYSTECTOMY    . ENDOMETRIAL ABLATION  01/11/2008   HER OPTION  . ESOPHAGOGASTRODUODENOSCOPY    . MULTIPLE TOOTH EXTRACTIONS    . PORTACATH PLACEMENT Left 02/23/2018   Procedure: INSERTION PORT-A-CATH;  Surgeon: Stark Klein, MD;  Location: [place];  Service: General;  Laterality: Left;  . PORTACATH PLACEMENT N/A 04/21/2018   Procedure: PORT-A-CATH REVISION;  Surgeon: Stark Klein, MD;  Location: [facility];  Service: General;  Laterality: N/A;  . TUBAL LIGATION    . WISDOM TOOTH EXTRACTION      There were no vitals filed for this visit.  Subjective Assessment - 02/21/19 1408    Subjective  I am doing ok.  having a bit more breast pain but not much.  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 Chemotherapy completed 07/05/18, Herceptin started with chemo and continues through August of this year.  Radiation 07/27/18-09/06/18 to the Rt breast, Antiestrogens to follow. Other medical history to include asthma, DM, HTN, HIV, and hysterectomy  Currently in Pain?  No/denies                  Outpatient Re[facility]27/2018 [facility]  Lymphedema Life Impact Scale Total Score  16.18 %           [facility] Adult PT Treatment/Exercise - 02/21/19 0001      Manual Therapy   Manual Therapy  Edema management    Manual therapy comments  pt arrives with new tribute night custom, sleeve and glove flat knit which fit well.  Pt donning each x 2 and demo of the arm butler.  Education on garment care             PT Education - 02/21/19 1444    Education Details  garment care and  donning/doffin    Person(s) Educated  Patient    Methods  Explanation;Demonstration;Tactile cues;Verbal cues    Comprehension  Verbalized understanding;Returned demonstration          PT Long Term Goals - 02/21/19 1445      PT LONG TERM GOAL #1   Title  Pt will improve Rt shoulder flexion from 150 to 165 to maximize function    Status  Partially Met      PT LONG TERM GOAL #2   Title  Pt will be independent in manual lymph drainage and use of compression for managment of breast and axillary edema     Status  Achieved      PT LONG TERM GOAL #3   Title  Pt will decrease Rt breast pain to intermittent only     Status  Achieved            Plan - 02/21/19 1445    Clinical Impression Statement  Pt arrived with new garments that fit well.  Education on care, donning, doffing and medi arm butler with gloves.  Ready for DC today    Consulted and Agree with Plan of Care  Patient       Patient will benefit from skilled therapeutic intervention in order to improve the following deficits and impairments:     Visit Diagnosis: 1. Lymphedema, not elsewhere classified        Problem List Patient Active Problem List   Diagnosis Date Noted  . Type 2 diabetes mellitus with other specified complication ([place]) 10/93/2355  . Hematochezia 03/30/2018  . Insomnia 03/30/2018  . Port-A-Cath in place 03/29/2018  . Genetic testing 01/30/2018  . Neoplasm of breast, regional lymph node staging category N3b: metastasis in ipsilateral internal mammary lymph node and axillary lymph node ([place]) 01/30/2018  . Family history of breast cancer   . Family history of colon cancer   . Family history of lung cancer   . Diabetes mellitus type 2 without retinopathy ([place]) 01/18/2018  . Morbid obesity ([place]) 01/18/2018  . Malignant neoplasm of upper-outer quadrant of right breast in female, estrogen receptor positive ([place]) 01/17/2018  . Achilles tendinitis 11/28/2017  . Hot flashes 09/30/2017  .  Hypertension 09/08/2017  . Contusion of left knee and lower leg 11/18/2015  . Mild intermittent asthma 08/15/2013  . BV (bacterial vaginosis) 08/11/2012  . Right knee pain 07/05/2012  . GERD (gastroesophageal reflux disease) 07/05/2012  . GC (gonococcus) 05/30/2012  . Chlamydia 05/30/2012  . Left knee pain 01/12/2012  . Cough 11/01/2011  . Encounter for long-term (current) use of high-risk medication 02/25/2011  . Preventative health care 02/14/2011  . VITAMIN D DEFICIENCY 01/30/2010  . ANEMIA-IRON DEFICIENCY  01/30/2010  . Edema 01/30/2010  . HIV (human immunodeficiency virus infection) ([place]) 03/12/2009  . GENITAL HERPES 03/12/2009  . Anxiety state 11/27/2007  . 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 Hyperlipidemia 08/02/2007    Stark Bray 02/21/2019, 2:46 PM  [place], [name], [address] Phone: [phone]   Fax:  [phone]  Name: Kelly Rowe MRN: [id number] Date of Birth: 1970/11/18 PHYSICAL THERAPY DISCHARGE SUMMARY  Visits from Start of Care: 7  Current functional level related to goals / functional outcomes: See above.  Ready for DC with garments and self MLD knowledge   Remaining deficits: chornic lymphedema and intermittent breast pain   Education / Equipment: garment use, HEP, MLD Plan: Patient agrees to discharge.  Patient goals were partially met. Patient is being discharged due to being pleased with the current functional level.  ?????    Shan Levans, PT

## 2019-03-11 NOTE — Progress Notes (Signed)
[place]  [phone] [phone]    ID: Kelly Rowe DOB: Aug 22, 1970  MR#: [id number]  SWF#:[id number]  Patient Care Team: Biagio Borg, MD as PCP - [name], MD as Consulting Physician (General Surgery) Lyndee Herbst, [name], MD as Consulting Physician (Oncology) [name], MD as Attending Physician (Radiation Oncology) [name], NP as Nurse Practitioner (Obstetrics and Gynecology) Marlinda Mike, PA-C as Referring Physician (Physician Assistant) OTHER MD:   CHIEF COMPLAINT: Triple positive breast cancer  CURRENT TREATMENT:  tamoxifen   INTERVAL HISTORY: Kelly Rowe returns today for follow-up and treatment of her triple positive breast cancer.  She continues on trastuzumab.  Today will be the last treatment. She notes that overall, she has tolerated her treatment well.   She also continues on tamoxifen. She does have severe hot flashes with diaphoresis. She does not have any issues with her hot flashes waking her up at night. She doe not have issues with vaginal discharge.   Chosen's last echocardiogram on 01/31/2019, showed an ejection fraction in the 55% - 60% range.  She was due for repeat diagnostic mammography June of this year.  This has not yet been scheduled.   REVIEW OF SYSTEMS: Kelly Rowe notes that her blood sugar has been high as she has been eating cakes recently. She has not returned back to work amid the pandemic, but she does note that she is nervous to return. She has been wearing her compression sleeve. The patient denies unusual headaches, visual changes, nausea, vomiting, or dizziness. There has been no unusual cough, phlegm production, or pleurisy. This been no change in bowel or bladder habits. The patient denies unexplained fatigue or unexplained weight loss, bleeding, rash, or fever. A detailed review of systems was otherwise noncontributory.    HISTORY OF CURRENT ILLNESS: From the original  intake note:  "Kelly Rowe" noticed bruising in her lateral right breast and palpated a mass  on 12/23/2017. She followed up with her gynecologist. She underwent unilateral right diagnostic mammography with tomography and right breast ultrasonography at [facility] on 01/05/2018 showing: breast density category B. There is an irregular highly suspicious mass within the right breast at the 9:30 o'clock upper outer quadrant, measuring 1.8 x 1.1 x 1.5 cm, and located 18 cm from the nipple,  Ultrasonography revealed a single morphologically abnormal lymph node in the RIGHT axilla, with cortical thickness of 6 mm.   Accordingly on 01/11/2018 she proceeded to biopsy of the right breast area in question as well as a suspicious lymph node. The pathology from this procedure showed ([id number]): Invasive ductal carcinoma grade III.  The lymph node biopsied was negative for carcinoma (concordant).. Prognostic indicators significant for: estrogen receptor, 60% positive with weak staining intensity and progesterone receptor, 10% positive with strong staining intensity. Proliferation marker Ki67 at 30%. HER2 amplified with ratios HER2/CEP17 signals 2.26 and average HER2 copies per cell 3.50  The patient's subsequent history is as detailed below.   PAST MEDICAL HISTORY: Past Medical History:  Diagnosis Date  . ANEMIA-IRON DEFICIENCY 01/30/2010  . ANXIETY 11/27/2007  . Arthritis   . Asthma 02/25/2011  . Cancer ([place])    breast  . Carbuncle and furuncle of trunk 04/16/2010  . Chlamydia infection 03/21/2008  . DIABETES MELLITUS, TYPE II 08/02/2007  . Edema 01/30/2010  . ELEVATED BLOOD PRESSURE WITHOUT DIAGNOSIS OF HYPERTENSION 08/02/2007  . Family history of breast cancer   . Family history of colon cancer   . Family history of lung cancer   .  FREQUENCY, URINARY 05/19/2009  . GENITAL HERPES 03/12/2009  . GERD (gastroesophageal reflux disease)   . History of kidney stones   . History of radiation therapy 07/27/18-  09/06/18   Right Breast, Right Axillary and Supraclavicular nodes 50 Gy in 25 fractions, Right Breast Boost 10 Gy in 5 fractions  . HIV (human immunodeficiency virus infection) ([place]) 2009  . HIV INFECTION 03/12/2009  . HSV (herpes simplex virus) infection   . HYPERLIPIDEMIA 08/02/2007  . Hypertension   . Metrorrhagia 03/21/2008  . PONV (postoperative nausea and vomiting)    woke up crying per patient   . RETENTION, URINE 05/19/2009  . Trichomonas infection 01/19/2010  . VITAMIN D DEFICIENCY 01/30/2010    PAST SURGICAL HISTORY: Past Surgical History:  Procedure Laterality Date  . ABDOMINAL HYSTERECTOMY  07/22/2010   TAH WITH PRESERVATION OF BOTH TUBES AND OVARIES  . BREAST LUMPECTOMY WITH RADIOACTIVE SEED AND SENTINEL LYMPH NODE BIOPSY Right 02/23/2018   Procedure: BREAST LUMPECTOMY WITH RADIOACTIVE SEED AND SENTINEL LYMPH NODE BIOPSY;  Surgeon: Stark Klein, MD;  Location: [facility];  Service: General;  Laterality: Right;  . CHOLECYSTECTOMY    . ENDOMETRIAL ABLATION  01/11/2008   HER OPTION  . ESOPHAGOGASTRODUODENOSCOPY    . MULTIPLE TOOTH EXTRACTIONS    . PORTACATH PLACEMENT Left 02/23/2018   Procedure: INSERTION PORT-A-CATH;  Surgeon: Stark Klein, MD;  Location: [facility];  Service: General;  Laterality: Left;  . PORTACATH PLACEMENT N/A 04/21/2018   Procedure: PORT-A-CATH REVISION;  Surgeon: Stark Klein, MD;  Location: [facility];  Service: General;  Laterality: N/A;  . TUBAL LIGATION    . WISDOM TOOTH EXTRACTION      FAMILY HISTORY: Family History  Problem Relation Age of Onset  . Prostate cancer Other   . Heart disease Other   . Stroke Other   . Diabetes Mother   . Hypertension Mother   . Diabetes Father   . Cancer Father        COLON and LU NG  . Stroke Paternal Uncle   . Breast cancer Paternal Aunt   . Lung cancer Maternal Grandmother 32       Mesothelioma  . Colon cancer Paternal Grandfather        dx over 43s  . Lung cancer Paternal Aunt   . Breast cancer Paternal Aunt   .  Breast cancer Paternal Aunt   . Heart attack Maternal Grandfather 71  . Colon cancer Other        MGM's 5 brothers   The patient's father died at age 106 due to metastatic liver cancer. The patient's mother is alive at 37. The patient's has 1 brother and 3 sisters. There was a paternal grandfather with colon cancer. There were 3 paternal aunts with breast cancer, 2 diagnosed in the 58's and 1 at age 40. There was a maternal grandmother with mesothelioma. The patient otherwise denies a history of ovarian cancer in the family.    GYNECOLOGIC HISTORY:  Patient's last menstrual period was 06/21/2010. Menarche: 48 years old Age at first live birth: 48 years old She is GXP5. Her LMP was December 2011. She is status post partial hysterectomy without oophorectomy. She took oral contraception for 3 years with no complications. She never took HRT.    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  At home are 2 of her sons, Glendell Docker and [name], and the patient's grandson, Amador Cunas 60 (who is Kelly Rowe son). The patient's oldest is Izell Mancos age 55  who lives in [place] as a cook. Daughter, [place] age 58 works as a Scientist, water quality. Son, Glendell Docker age 58 is disabled. Son, [name] age 27 lives in [place] as a Microbiologist. Daughter, Benard Rink age 46 also is a Microbiologist. The patient has 5 grandchildren and no great grandchildren. The patient does not belong to a church.    ADVANCED DIRECTIVES: Not in place; at the 01/18/2018 visit the patient was given the appropriate documents to complete on notarized at her discretion   HEALTH MAINTENANCE: Social History   Tobacco Use  . Smoking status: Never Smoker  . Smokeless tobacco: Never Used  Substance Use Topics  . Alcohol use: Never    Alcohol/week: 0.0 standard drinks    Frequency: Never  . Drug use: No     Colonoscopy: Not yet  PAP: November 2018  Bone density: Never   Allergies  Allergen Reactions  . Metformin And Related Other (See Comments)    Bowel  frequency  . Levaquin [Levofloxacin In D5w] Nausea And Vomiting and Rash  . Penicillins Swelling and Rash    Has patient had a PCN reaction causing immediate rash, facial/tongue/throat swelling, SOB or lightheadedness with hypotension: Yes Has patient had a PCN reaction causing severe rash involving mucus membranes or skin necrosis: No Has patient had a PCN reaction that required hospitalization: Yes Has patient had a PCN reaction occurring within the last 10 years: No If all of the above answers are "NO", then may proceed with Cephalosporin use.    Current Outpatient Medications  Medication Sig Dispense Refill  . albuterol (PROVENTIL HFA;VENTOLIN HFA) 108 (90 Base) MCG/ACT inhaler Inhale 2 puffs into the lungs every 6 (six) hours as needed for wheezing or shortness of breath.    Marland Kitchen aspirin 81 MG tablet Take 81 mg by mouth daily as needed (chest pain).     . Azelastine-Fluticasone (DYMISTA) 137-50 MCG/ACT SUSP Use as directed 1 spray each side twice per day as needed 23 g 5  . Beclomethasone Dipropionate (QNASL) 80 MCG/ACT AERS Place 2 sprays into the nose daily. 1 Inhaler 1  . bictegravir-emtricitabine-tenofovir AF (BIKTARVY) 50-200-25 MG TABS tablet Take 1 tablet by mouth daily.    . blood glucose meter kit and supplies Dispense based on patient and insurance preference. Use up to four times daily as directed. (FOR ICD-10 E10.9, E11.9). 1 each 0  . carvedilol (COREG) 6.25 MG tablet Take 1 tablet (6.25 mg total) by mouth 2 (two) times daily. 60 tablet 3  . Diclofenac Sodium (PENNSAID) 2 % SOLN Place 1 application onto the skin 2 (two) times daily. (Patient taking differently: Place 1 application onto the skin 2 (two) times daily as needed. ) 1 Bottle 3  . Dulaglutide (TRULICITY) 1.5 NW/2.9FA SOPN 0.5 ml weekly SQ 6 mL 3  . empagliflozin (JARDIANCE) 25 MG TABS tablet Take 25 mg by mouth daily. 90 tablet 3  . famotidine (PEPCID) 20 MG tablet Take 1 tablet (20 mg total) by mouth 2 (two) times  daily. (Patient taking differently: Take 20 mg by mouth daily as needed for heartburn. ) 60 tablet 3  . ferrous gluconate (IRON 27) 240 (27 FE) MG tablet Take 240 mg by mouth daily as needed (iron).     . gabapentin (NEURONTIN) 300 MG capsule Take 1 capsule (300 mg total) by mouth at bedtime. 90 capsule 4  . glipiZIDE (GLUCOTROL XL) 10 MG 24 hr tablet Take 1 tablet (10 mg total) by mouth daily with breakfast. 90 tablet 3  . ibuprofen (  ADVIL,MOTRIN) 800 MG tablet Take 1 tablet (800 mg total) by mouth every 8 (eight) hours as needed. (Patient taking differently: Take 800 mg by mouth daily as needed for moderate pain. ) 60 tablet 0  . loratadine (CLARITIN) 10 MG tablet Take 10 mg by mouth daily.    Marland Kitchen LORazepam (ATIVAN) 0.5 MG tablet Take 1 tablet (0.5 mg total) by mouth at bedtime as needed (Nausea or vomiting). 30 tablet 0  . losartan (COZAAR) 25 MG tablet Take 1 tablet by mouth once daily 30 tablet 0  . Multiple Vitamin (MULTIVITAMIN WITH MINERALS) TABS tablet Take 1 tablet by mouth daily.    Marland Kitchen omeprazole (PRILOSEC) 40 MG capsule Take 1 capsule (40 mg total) by mouth daily. 30 capsule 0  . ONETOUCH VERIO test strip USE TO TEST BLOOD SUGAR UP TO 4 TIMES DAILY 100 each 1  . silver sulfADIAZINE (SILVADENE) 1 % cream Apply 1 application topically 2 (two) times daily. 400 g 1  . sulfamethoxazole-trimethoprim (BACTRIM DS) 800-160 MG tablet Take 1 tablet by mouth 2 (two) times daily. 6 tablet 0  . tamoxifen (NOLVADEX) 20 MG tablet Take 1 tablet (20 mg total) by mouth daily. 90 tablet 12  . valACYclovir (VALTREX) 500 MG tablet Take twice daily for 3-5 days 30 tablet 12  . venlafaxine XR (EFFEXOR-XR) 37.5 MG 24 hr capsule Take 1 capsule (37.5 mg total) by mouth daily with breakfast. 90 capsule 4  . zolpidem (AMBIEN) 10 MG tablet TAKE 1 TABLET BY MOUTH AT BEDTIME AS NEEDED FOR SLEEP 45 tablet 1   No current facility-administered medications for this visit.    Facility-Administered Medications Ordered in  Other Visits  Medication Dose Route Frequency Provider Last Rate Last Dose  . sodium chloride 0.9 % 1,000 mL with potassium chloride 10 mEq infusion   Intravenous Once Whitnie Deleon, [name], MD      . sodium chloride flush (NS) 0.9 % injection 10 mL  10 mL Intracatheter PRN Mihir Flanigan, [name], MD   10 mL at 09/04/18 1425    OBJECTIVE: Morbidly obese African-American woman who appears stated age  Vitals:   03/12/19 0948  BP: 130/68  Pulse: 94  Resp: 18  Temp: 98 F (36.7 C)  SpO2: 99%     Body mass index is 47.57 kg/m.   Wt Readings from Last 3 Encounters:  03/12/19 260 lb 1.6 oz (118 kg)  01/24/19 256 lb 8 oz (116.3 kg)  01/08/19 257 lb 4 oz (116.7 kg)  ECOG FS: 2 - Symptomatic, <50% confined to bed  Sclerae unicteric, EOMs intact Wearing a mask No cervical or supraclavicular adenopathy Lungs no rales or rhonchi Heart regular rate and rhythm Abd soft, nontender, positive bowel sounds MSK no focal spinal tenderness, no upper extremity lymphedema Neuro: nonfocal, well oriented, appropriate affect Breasts: The right breast is status post lumpectomy and radiation.  There is still significant hyperpigmentation.  There is no evidence of disease recurrence.  The left breast is benign.  Both axillae are benign.  LAB RESULTS:  CMP     Component Value Date/Time   NA 138 02/19/2019 1318   K 4.0 02/19/2019 1318   CL 102 02/19/2019 1318   CO2 27 02/19/2019 1318   GLUCOSE 179 (H) 02/19/2019 1318   BUN 12 02/19/2019 1318   CREATININE 0.92 02/19/2019 1318   CALCIUM 9.1 02/19/2019 1318   PROT 7.2 02/19/2019 1318   ALBUMIN 3.3 (L) 02/19/2019 1318   AST 17 02/19/2019 1318   ALT 17  02/19/2019 1318   ALKPHOS 116 02/19/2019 1318   BILITOT 0.2 (L) 02/19/2019 1318   GFRNONAA >60 02/19/2019 1318   GFRAA >60 02/19/2019 1318    No results found for: TOTALPROTELP, ALBUMINELP, A1GS, A2GS, BETS, BETA2SER, GAMS, MSPIKE, SPEI  No results found for: KPAFRELGTCHN, LAMBDASER, KAPLAMBRATIO  Lab  Results  Component Value Date   WBC 2.7 (L) 03/12/2019   NEUTROABS PENDING 03/12/2019   HGB 11.9 (L) 03/12/2019   HCT 37.7 03/12/2019   MCV 87.9 03/12/2019   PLT 197 03/12/2019    @LASTCHEMISTRY @  No results found for: LABCA2  No components found for: [id number]  No results for input(s): INR in the last 168 hours.  No results found for: LABCA2  No results found for: OVA919  No results found for: TYO060  No results found for: OKH997  No results found for: CA2729  No components found for: HGQUANT  No results found for: CEA1 / No results found for: CEA1   No results found for: AFPTUMOR  No results found for: CHROMOGRNA  No results found for: PSA1  Appointment on 03/12/2019  Component Date Value Ref Range Status  . WBC Count 03/12/2019 2.7* 4.0 - 10.5 K/uL Final  . RBC 03/12/2019 4.29  3.87 - 5.11 MIL/uL Final  . Hemoglobin 03/12/2019 11.9* 12.0 - 15.0 g/dL Final  . HCT 03/12/2019 37.7  36.0 - 46.0 % Final  . MCV 03/12/2019 87.9  80.0 - 100.0 fL Final  . MCH 03/12/2019 27.7  26.0 - 34.0 pg Final  . MCHC 03/12/2019 31.6  30.0 - 36.0 g/dL Final  . RDW 03/12/2019 13.9  11.5 - 15.5 % Final  . Platelet Count 03/12/2019 197  150 - 400 K/uL Final  . nRBC 03/12/2019 0.0  0.0 - 0.2 % Final   Performed at [facility], [place] [address], [place], [place] [address]  . Neutrophils Relative % 03/12/2019 PENDING  % Incomplete  . Neutro Abs 03/12/2019 PENDING  1.7 - 7.7 K/uL Incomplete  . Band Neutrophils 03/12/2019 PENDING  % Incomplete  . Lymphocytes Relative 03/12/2019 PENDING  % Incomplete  . Lymphs Abs 03/12/2019 PENDING  0.7 - 4.0 K/uL Incomplete  . Monocytes Relative 03/12/2019 PENDING  % Incomplete  . Monocytes Absolute 03/12/2019 PENDING  0.1 - 1.0 K/uL Incomplete  . Eosinophils Relative 03/12/2019 PENDING  % Incomplete  . Eosinophils Absolute 03/12/2019 PENDING  0.0 - 0.5 K/uL Incomplete  . Basophils Relative 03/12/2019 PENDING  % Incomplete  .  Basophils Absolute 03/12/2019 PENDING  0.0 - 0.1 K/uL Incomplete  . WBC Morphology 03/12/2019 PENDING   Incomplete  . RBC Morphology 03/12/2019 PENDING   Incomplete  . Smear Review 03/12/2019 PENDING   Incomplete  . Other 03/12/2019 PENDING  % Incomplete  . nRBC 03/12/2019 PENDING  0 /100 WBC Incomplete  . Metamyelocytes Relative 03/12/2019 PENDING  % Incomplete  . Myelocytes 03/12/2019 PENDING  % Incomplete  . Promyelocytes Relative 03/12/2019 PENDING  % Incomplete  . Blasts 03/12/2019 PENDING  % Incomplete    (this displays the last labs from the last 3 days)  No results found for: TOTALPROTELP, ALBUMINELP, A1GS, A2GS, BETS, BETA2SER, GAMS, MSPIKE, SPEI (this displays SPEP labs)  No results found for: KPAFRELGTCHN, LAMBDASER, KAPLAMBRATIO (kappa/lambda light chains)  No results found for: HGBA, HGBA2QUANT, HGBFQUANT, HGBSQUAN (Hemoglobinopathy evaluation)   No results found for: LDH  Lab Results  Component Value Date   IRON 35 (L) 01/30/2010   IRONPCTSAT 8.8 (L) 01/30/2010   (Iron and  TIBC)  No results found for: FERRITIN  Urinalysis    Component Value Date/Time   COLORURINE DARK YELLOW 11/06/2018 1121   APPEARANCEUR CLOUDY (A) 11/06/2018 1121   LABSPEC 1.030 11/06/2018 1121   PHURINE 5.5 11/06/2018 1121   GLUCOSEU 1+ (A) 11/06/2018 1121   GLUCOSEU >=1000 (A) 10/03/2018 1115   HGBUR NEGATIVE 11/06/2018 1121   BILIRUBINUR NEGATIVE 10/03/2018 1115   KETONESUR TRACE (A) 11/06/2018 1121   PROTEINUR 1+ (A) 11/06/2018 1121   UROBILINOGEN 0.2 10/03/2018 1115   NITRITE NEGATIVE 10/03/2018 1115   LEUKOCYTESUR NEGATIVE 10/03/2018 1115     STUDIES: Mammography due  ELIGIBLE FOR AVAILABLE RESEARCH PROTOCOL: no   ASSESSMENT: 48 y.o. [place], [name] woman status post right breast upper outer quadrant biopsy 01/11/2018 for a clinical T1 N0, stage IA invasive ductal carcinoma, grade 3, estrogen and progesterone receptor positive, HER-2 amplified, with an MIB-1 of 30%.   (1) status post right lumpectomy and sentinel lymph node sampling 02/23/2018 for a pT2 pN1, stage IB invasive ductal carcinoma, grade 3, with close but negative margins  (2) adjuvant chemotherapy consisting of carboplatin, docetaxel, trastuzumab and Pertuzumab every 21 days x 6 given between 03/08/2018-07/05/2018 (a) pertuzumab stopped after cycle 1 due to diarrhea (b) docetaxel changed to gemcitabine after cycle 2 due to persistent hyperglycemia (c) Gemcitabine and Carboplatin dose reduced by approximately 10% due to delayed neutropenia and thrombocytopenia  (3) anti-HER-2 immunotherapy started concurrently with chemotherapy (a) baseline echocardiogram on 02/07/2018 showed an ejection fraction in the 55-60% range (b) repeat echo 06/19/2018 showed an ejection fraction of 55-60% (c) repeat echocardiogram 11/14/2018 showed an EF in the 50-55% range, with normal strain. (d) echo 01/31/2019 shows an ejection fraction in the 55/60% range (e) final trastuzumab dose 03/12/2019  (4) adjuvant radiation 07/27/2018 - 09/06/2018  Site/dose: 1. Right Breast / 50 Gy in 25 fractions    2. Right Supraclavicular nodes / 50 Gy in 25 fractions    3. Right Breast Boost / 10 Gy in 5 fractions  (5) antiestrogens to follow at the completion of local treatment  (6) genetics testing through [facility] Multi-cancer and Breast panel on 01/13/2018 showed: no deleterious mutations. The following genes were evaluated for sequence changes and exonic deletions/duplications: ALK, APC, ATM, AXIN2, BAP1, BARD1, BLM, BMPR1A, BRCA1, BRCA2, BRIP1, CASR, CDC73, CDH1, CDK4, CDKN1B, CDKN1C,CDKN2A (p14ARF), CDKN2A (p16INK4a), CEBPA, CHEK2, CTNNA1, DICER1, DIS3L2, EPCAM*, FH, FLCN, GATA2, GPC3, GREM1*, HRAS, KIT, MAX, MEN1, MET, MLH1, MSH2, MSH3, MSH6, MUTYH, NBN, NF1, NF2, PALB2, PDGFRA, PHOX2B*, PMS2, POLD1,POLE, POT1, PRKAR1A, PTCH1, PTEN, RAD50, RAD51C, RAD51D, RB1, RECQL4, RET, RUNX1, SDHAF2, SDHB, SDHC, SDHD,SMAD4, SMARCA4,  SMARCB1, SMARCE1, STK11, SUFU, TERC, TERT, TMEM127, TP53, TSC1, TSC2, VHL, WRN*, WT1.The following genes were evaluated for sequence changes only: EGFR*, HOXB13*, MITF*, NTHL1*, SDHA   PLAN: Sherriann is now a year out from definitive surgery for her breast cancer, with no evidence of disease recurrence.  This is favorable.  She completes her Herceptin treatments today.  She has tolerated them well, with no evidence of congestive heart failure developing.  She is ready to have her port removed.  She continues on tamoxifen, with good tolerance.  She was not aware that the venlafaxine really is not for depression but for hot flashes treatment.  She will give it a try.  She will see her primary care physician and gynecologist in September.  She will see me again 6 months from now  She knows to call for any other issue that may develop before the next visit.  Khailee Mick, [name], MD  03/12/19 10:08 AM Medical Oncology and Hematology [facility] [address] [place], [place] [address] Tel. [phone]    Fax. [phone]  I, Jacqualyn Posey am acting as a Education administrator for Chauncey Cruel, MD.   I, Lurline Del MD, have reviewed the above documentation for accuracy and completeness, and I agree with the above.

## 2019-03-12 ENCOUNTER — Inpatient Hospital Stay (HOSPITAL_BASED_OUTPATIENT_CLINIC_OR_DEPARTMENT_OTHER): Payer: BC Managed Care – PPO | Admitting: Oncology

## 2019-03-12 ENCOUNTER — Inpatient Hospital Stay: Payer: BC Managed Care – PPO | Attending: Oncology

## 2019-03-12 ENCOUNTER — Inpatient Hospital Stay: Payer: BC Managed Care – PPO

## 2019-03-12 ENCOUNTER — Encounter: Payer: Self-pay | Admitting: *Deleted

## 2019-03-12 ENCOUNTER — Other Ambulatory Visit: Payer: Self-pay

## 2019-03-12 VITALS — BP 130/68 | HR 94 | Temp 98.0°F | Resp 18 | Ht 62.0 in | Wt 260.1 lb

## 2019-03-12 DIAGNOSIS — Z5112 Encounter for antineoplastic immunotherapy: Secondary | ICD-10-CM | POA: Diagnosis present

## 2019-03-12 DIAGNOSIS — Z923 Personal history of irradiation: Secondary | ICD-10-CM | POA: Diagnosis not present

## 2019-03-12 DIAGNOSIS — Z7984 Long term (current) use of oral hypoglycemic drugs: Secondary | ICD-10-CM | POA: Insufficient documentation

## 2019-03-12 DIAGNOSIS — B20 Human immunodeficiency virus [HIV] disease: Secondary | ICD-10-CM | POA: Insufficient documentation

## 2019-03-12 DIAGNOSIS — Z800 Family history of malignant neoplasm of digestive organs: Secondary | ICD-10-CM | POA: Insufficient documentation

## 2019-03-12 DIAGNOSIS — C50411 Malignant neoplasm of upper-outer quadrant of right female breast: Secondary | ICD-10-CM | POA: Diagnosis present

## 2019-03-12 DIAGNOSIS — Z7982 Long term (current) use of aspirin: Secondary | ICD-10-CM | POA: Insufficient documentation

## 2019-03-12 DIAGNOSIS — Z90710 Acquired absence of both cervix and uterus: Secondary | ICD-10-CM | POA: Diagnosis not present

## 2019-03-12 DIAGNOSIS — Z801 Family history of malignant neoplasm of trachea, bronchus and lung: Secondary | ICD-10-CM | POA: Insufficient documentation

## 2019-03-12 DIAGNOSIS — D649 Anemia, unspecified: Secondary | ICD-10-CM | POA: Diagnosis not present

## 2019-03-12 DIAGNOSIS — C773 Secondary and unspecified malignant neoplasm of axilla and upper limb lymph nodes: Secondary | ICD-10-CM

## 2019-03-12 DIAGNOSIS — Z79899 Other long term (current) drug therapy: Secondary | ICD-10-CM | POA: Diagnosis not present

## 2019-03-12 DIAGNOSIS — I10 Essential (primary) hypertension: Secondary | ICD-10-CM | POA: Diagnosis not present

## 2019-03-12 DIAGNOSIS — Z803 Family history of malignant neoplasm of breast: Secondary | ICD-10-CM | POA: Insufficient documentation

## 2019-03-12 DIAGNOSIS — E1165 Type 2 diabetes mellitus with hyperglycemia: Secondary | ICD-10-CM | POA: Diagnosis not present

## 2019-03-12 DIAGNOSIS — Z170 Estrogen receptor positive status [ER+]: Secondary | ICD-10-CM | POA: Insufficient documentation

## 2019-03-12 DIAGNOSIS — Z95828 Presence of other vascular implants and grafts: Secondary | ICD-10-CM

## 2019-03-12 DIAGNOSIS — C50911 Malignant neoplasm of unspecified site of right female breast: Secondary | ICD-10-CM

## 2019-03-12 DIAGNOSIS — Z79810 Long term (current) use of selective estrogen receptor modulators (SERMs): Secondary | ICD-10-CM | POA: Diagnosis not present

## 2019-03-12 LAB — CBC WITH DIFFERENTIAL (CANCER CENTER ONLY)
Abs Immature Granulocytes: 0.01 10*3/uL (ref 0.00–0.07)
Basophils Absolute: 0 10*3/uL (ref 0.0–0.1)
Basophils Relative: 1 %
Eosinophils Absolute: 0 10*3/uL (ref 0.0–0.5)
Eosinophils Relative: 1 %
HCT: 37.7 % (ref 36.0–46.0)
Hemoglobin: 11.9 g/dL — ABNORMAL LOW (ref 12.0–15.0)
Immature Granulocytes: 0 %
Lymphocytes Relative: 37 %
Lymphs Abs: 1 10*3/uL (ref 0.7–4.0)
MCH: 27.7 pg (ref 26.0–34.0)
MCHC: 31.6 g/dL (ref 30.0–36.0)
MCV: 87.9 fL (ref 80.0–100.0)
Monocytes Absolute: 0.3 10*3/uL (ref 0.1–1.0)
Monocytes Relative: 12 %
Neutro Abs: 1.3 10*3/uL — ABNORMAL LOW (ref 1.7–7.7)
Neutrophils Relative %: 49 %
Platelet Count: 197 10*3/uL (ref 150–400)
RBC: 4.29 MIL/uL (ref 3.87–5.11)
RDW: 13.9 % (ref 11.5–15.5)
WBC Count: 2.7 10*3/uL — ABNORMAL LOW (ref 4.0–10.5)
nRBC: 0 % (ref 0.0–0.2)

## 2019-03-12 LAB — CMP (CANCER CENTER ONLY)
ALT: 21 U/L (ref 0–44)
AST: 15 U/L (ref 15–41)
Albumin: 3.3 g/dL — ABNORMAL LOW (ref 3.5–5.0)
Alkaline Phosphatase: 138 U/L — ABNORMAL HIGH (ref 38–126)
Anion gap: 10 (ref 5–15)
BUN: 11 mg/dL (ref 6–20)
CO2: 26 mmol/L (ref 22–32)
Calcium: 8.7 mg/dL — ABNORMAL LOW (ref 8.9–10.3)
Chloride: 98 mmol/L (ref 98–111)
Creatinine: 0.82 mg/dL (ref 0.44–1.00)
GFR, Est AFR Am: >60 mL/min (ref >60)
GFR, Estimated: >60 mL/min (ref >60)
Glucose, Bld: 312 mg/dL — ABNORMAL HIGH (ref 70–99)
Potassium: 4.1 mmol/L (ref 3.5–5.1)
Sodium: 134 mmol/L — ABNORMAL LOW (ref 135–145)
Total Bilirubin: 0.2 mg/dL — ABNORMAL LOW (ref 0.3–1.2)
Total Protein: 7 g/dL (ref 6.5–8.1)

## 2019-03-12 MED ORDER — TAMOXIFEN CITRATE 20 MG PO TABS: 20.0000 mg | ORAL_TABLET | Freq: Every day | ORAL | 12 refills | Status: AC

## 2019-03-12 MED ORDER — HEPARIN SOD (PORK) LOCK FLUSH 100 UNIT/ML IV SOLN
500.0000 [IU] | Freq: Once | INTRAVENOUS | Status: AC | PRN
  Administered 2019-03-12: 500 [IU]
  Filled 2019-03-12: qty 5

## 2019-03-12 MED ORDER — DIPHENHYDRAMINE HCL 25 MG PO CAPS
25.0000 mg | ORAL_CAPSULE | Freq: Once | ORAL | Status: AC
  Administered 2019-03-12: 25 mg via ORAL

## 2019-03-12 MED ORDER — DIPHENHYDRAMINE HCL 25 MG PO CAPS
ORAL_CAPSULE | ORAL | Status: AC
  Filled 2019-03-12: qty 1

## 2019-03-12 MED ORDER — ACETAMINOPHEN 325 MG PO TABS
650.0000 mg | ORAL_TABLET | Freq: Once | ORAL | Status: AC
  Administered 2019-03-12: 650 mg via ORAL

## 2019-03-12 MED ORDER — ACETAMINOPHEN 325 MG PO TABS
ORAL_TABLET | ORAL | Status: AC
  Filled 2019-03-12: qty 2

## 2019-03-12 MED ORDER — SODIUM CHLORIDE 0.9% FLUSH
10.0000 mL | INTRAVENOUS | Status: DC | PRN
  Administered 2019-03-12: 10 mL
  Filled 2019-03-12: qty 10

## 2019-03-12 MED ORDER — VENLAFAXINE HCL ER 37.5 MG PO CP24: 37.5000 mg | ORAL_CAPSULE | Freq: Every day | ORAL | 4 refills | Status: DC

## 2019-03-12 MED ORDER — TRASTUZUMAB CHEMO 150 MG IV SOLR
6.0000 mg/kg | Freq: Once | INTRAVENOUS | Status: AC
  Administered 2019-03-12: 714 mg via INTRAVENOUS
  Filled 2019-03-12: qty 34

## 2019-03-12 MED ORDER — SODIUM CHLORIDE 0.9% FLUSH
10.0000 mL | Freq: Once | INTRAVENOUS | Status: AC
  Administered 2019-03-12: 10:00:00 10 mL
  Filled 2019-03-12: qty 10

## 2019-03-12 MED ORDER — SODIUM CHLORIDE 0.9 % IV SOLN
Freq: Once | INTRAVENOUS | Status: AC
  Administered 2019-03-12: 11:00:00 via INTRAVENOUS
  Filled 2019-03-12: qty 250

## 2019-03-12 NOTE — Patient Instructions (Signed)
[facility] Discharge Instructions for Patients Receiving Chemotherapy  Today you received the following chemotherapy agents Herceptin  To help prevent nausea and vomiting after your treatment, we encourage you to take your nausea medication as directed   If you develop nausea and vomiting that is not controlled by your nausea medication, call the clinic.   BELOW ARE SYMPTOMS THAT SHOULD BE REPORTED IMMEDIATELY:  *FEVER GREATER THAN 100.5 F  *CHILLS WITH OR WITHOUT FEVER  NAUSEA AND VOMITING THAT IS NOT CONTROLLED WITH YOUR NAUSEA MEDICATION  *UNUSUAL SHORTNESS OF BREATH  *UNUSUAL BRUISING OR BLEEDING  TENDERNESS IN MOUTH AND THROAT WITH OR WITHOUT PRESENCE OF ULCERS  *URINARY PROBLEMS  *BOWEL PROBLEMS  UNUSUAL RASH Items with * indicate a potential emergency and should be followed up as soon as possible.  Feel free to call the clinic should you have any questions or concerns. The clinic phone number is [phone].  Please show the CHEMO ALERT CARD at check-in to the Emergency Department and triage nurse.   

## 2019-03-15 ENCOUNTER — Other Ambulatory Visit: Payer: Self-pay | Admitting: General Surgery

## 2019-03-28 ENCOUNTER — Ambulatory Visit: Payer: BC Managed Care – PPO | Admitting: Dietician

## 2019-04-05 ENCOUNTER — Encounter: Payer: Self-pay | Admitting: Internal Medicine

## 2019-04-05 ENCOUNTER — Ambulatory Visit (INDEPENDENT_AMBULATORY_CARE_PROVIDER_SITE_OTHER): Payer: BC Managed Care – PPO | Admitting: Internal Medicine

## 2019-04-05 ENCOUNTER — Other Ambulatory Visit: Payer: Self-pay

## 2019-04-05 VITALS — BP 124/84 | HR 92 | Temp 98.1°F | Ht 62.0 in | Wt 253.0 lb

## 2019-04-05 DIAGNOSIS — J4520 Mild intermittent asthma, uncomplicated: Secondary | ICD-10-CM | POA: Diagnosis not present

## 2019-04-05 DIAGNOSIS — E785 Hyperlipidemia, unspecified: Secondary | ICD-10-CM

## 2019-04-05 DIAGNOSIS — Z23 Encounter for immunization: Secondary | ICD-10-CM

## 2019-04-05 DIAGNOSIS — E538 Deficiency of other specified B group vitamins: Secondary | ICD-10-CM

## 2019-04-05 DIAGNOSIS — E1169 Type 2 diabetes mellitus with other specified complication: Secondary | ICD-10-CM

## 2019-04-05 DIAGNOSIS — I10 Essential (primary) hypertension: Secondary | ICD-10-CM | POA: Diagnosis not present

## 2019-04-05 DIAGNOSIS — K921 Melena: Secondary | ICD-10-CM

## 2019-04-05 DIAGNOSIS — E559 Vitamin D deficiency, unspecified: Secondary | ICD-10-CM

## 2019-04-05 DIAGNOSIS — Z0000 Encounter for general adult medical examination without abnormal findings: Secondary | ICD-10-CM

## 2019-04-05 DIAGNOSIS — E611 Iron deficiency: Secondary | ICD-10-CM

## 2019-04-05 LAB — POCT GLYCOSYLATED HEMOGLOBIN (HGB A1C): Hemoglobin A1C: 10.1 % — AB (ref 4.0–5.6)

## 2019-04-05 MED ORDER — PIOGLITAZONE HCL 45 MG PO TABS: 45.0000 mg | ORAL_TABLET | Freq: Every day | ORAL | 3 refills | Status: DC

## 2019-04-05 MED ORDER — HYDROCORTISONE ACETATE 25 MG RE SUPP: 25.0000 mg | Freq: Two times a day (BID) | RECTAL | 1 refills | Status: AC

## 2019-04-05 MED ORDER — TRULICITY 1.5 MG/0.5ML ~~LOC~~ SOAJ: SUBCUTANEOUS | 3 refills | Status: DC

## 2019-04-05 NOTE — Progress Notes (Addendum)
Subjective:    Patient ID: Kelly Rowe, female    DOB: January 19, 1971, 48 y.o.   MRN: [id number]  HPI    Here to f/u; overall doing ok,  Pt denies chest pain, increasing sob or doe, wheezing, orthopnea, PND, increased LE swelling, palpitations, dizziness or syncope.  Pt denies new neurological symptoms such as new headache, or facial or extremity weakness or numbness.  Pt denies polydipsia, polyuria, or low sugar episode.  Pt states overall good compliance with meds, mostly trying to follow appropriate diet, with wt overall stable,  but little exercise however. Lost 7 lbs with better diet recently. Admits to some diet indiscretion recenlty, with sugars up to 400 sometimes.    Wt Readings from Last 3 Encounters:  04/05/19 253 lb (114.8 kg)  03/12/19 260 lb 1.6 oz (118 kg)  01/24/19 256 lb 8 oz (116.3 kg)   BP Readings from Last 3 Encounters:  04/05/19 124/84  03/12/19 130/68  02/19/19 115/66  Denies worsening reflux, abd pain, dysphagia, n/v, bowel change bt has had mild intermittent BRB in stools over the past month several times, maybe more when she has been constipated.  No prior colonoscopy.  Also has know hemorrhoid likley stage 3 as they protrude and then she pushes back in.  Due for tdap and flu shots Past Medical History:  Diagnosis Date  . ANEMIA-IRON DEFICIENCY 01/30/2010  . ANXIETY 11/27/2007  . Arthritis   . Asthma 02/25/2011  . Cancer ([place])    breast  . Carbuncle and furuncle of trunk 04/16/2010  . Chlamydia infection 03/21/2008  . DIABETES MELLITUS, TYPE II 08/02/2007  . Edema 01/30/2010  . ELEVATED BLOOD PRESSURE WITHOUT DIAGNOSIS OF HYPERTENSION 08/02/2007  . Family history of breast cancer   . Family history of colon cancer   . Family history of lung cancer   . FREQUENCY, URINARY 05/19/2009  . GENITAL HERPES 03/12/2009  . GERD (gastroesophageal reflux disease)   . History of kidney stones   . History of radiation therapy 07/27/18- 09/06/18   Right Breast, Right Axillary and  Supraclavicular nodes 50 Gy in 25 fractions, Right Breast Boost 10 Gy in 5 fractions  . HIV (human immunodeficiency virus infection) ([place]) 2009  . HIV INFECTION 03/12/2009  . HSV (herpes simplex virus) infection   . HYPERLIPIDEMIA 08/02/2007  . Hypertension   . Metrorrhagia 03/21/2008  . PONV (postoperative nausea and vomiting)    woke up crying per patient   . RETENTION, URINE 05/19/2009  . Trichomonas infection 01/19/2010  . VITAMIN D DEFICIENCY 01/30/2010   Past Surgical History:  Procedure Laterality Date  . ABDOMINAL HYSTERECTOMY  07/22/2010   TAH WITH PRESERVATION OF BOTH TUBES AND OVARIES  . BREAST LUMPECTOMY WITH RADIOACTIVE SEED AND SENTINEL LYMPH NODE BIOPSY Right 02/23/2018   Procedure: BREAST LUMPECTOMY WITH RADIOACTIVE SEED AND SENTINEL LYMPH NODE BIOPSY;  Surgeon: Stark Klein, MD;  Location: [facility];  Service: General;  Laterality: Right;  . CHOLECYSTECTOMY    . ENDOMETRIAL ABLATION  01/11/2008   HER OPTION  . ESOPHAGOGASTRODUODENOSCOPY    . MULTIPLE TOOTH EXTRACTIONS    . PORTACATH PLACEMENT Left 02/23/2018   Procedure: INSERTION PORT-A-CATH;  Surgeon: Stark Klein, MD;  Location: [facility];  Service: General;  Laterality: Left;  . PORTACATH PLACEMENT N/A 04/21/2018   Procedure: PORT-A-CATH REVISION;  Surgeon: Stark Klein, MD;  Location: [facility];  Service: General;  Laterality: N/A;  . TUBAL LIGATION    . WISDOM TOOTH EXTRACTION      reports  that she has never smoked. She has never used smokeless tobacco. She reports that she does not drink alcohol or use drugs. family history includes Breast cancer in her paternal aunt, paternal aunt, and paternal aunt; Cancer in her father; Colon cancer in her paternal grandfather and another family member; Diabetes in her father and mother; Heart attack (age of onset: 50) in her maternal grandfather; Heart disease in an other family member; Hypertension in her mother; Lung cancer in her paternal aunt; Lung cancer (age of onset: 24) in her  maternal grandmother; Prostate cancer in an other family member; Stroke in her paternal uncle and another family member. Allergies  Allergen Reactions  . Metformin And Related Other (See Comments)    Bowel frequency  . Levaquin [Levofloxacin In D5w] Nausea And Vomiting and Rash  . Penicillins Swelling and Rash    Has patient had a PCN reaction causing immediate rash, facial/tongue/throat swelling, SOB or lightheadedness with hypotension: Yes Has patient had a PCN reaction causing severe rash involving mucus membranes or skin necrosis: No Has patient had a PCN reaction that required hospitalization: Yes Has patient had a PCN reaction occurring within the last 10 years: No If all of the above answers are "NO", then may proceed with Cephalosporin use.   Current Outpatient Medications on File Prior to Visit  Medication Sig Dispense Refill  . albuterol (PROVENTIL HFA;VENTOLIN HFA) 108 (90 Base) MCG/ACT inhaler Inhale 2 puffs into the lungs every 6 (six) hours as needed for wheezing or shortness of breath.    Marland Kitchen aspirin 81 MG tablet Take 81 mg by mouth daily as needed (chest pain).     . Azelastine-Fluticasone (DYMISTA) 137-50 MCG/ACT SUSP Use as directed 1 spray each side twice per day as needed 23 g 5  . Beclomethasone Dipropionate (QNASL) 80 MCG/ACT AERS Place 2 sprays into the nose daily. 1 Inhaler 1  . bictegravir-emtricitabine-tenofovir AF (BIKTARVY) 50-200-25 MG TABS tablet Take 1 tablet by mouth daily.    . blood glucose meter kit and supplies Dispense based on patient and insurance preference. Use up to four times daily as directed. (FOR ICD-10 E10.9, E11.9). 1 each 0  . carvedilol (COREG) 6.25 MG tablet Take 1 tablet (6.25 mg total) by mouth 2 (two) times daily. 60 tablet 3  . Diclofenac Sodium (PENNSAID) 2 % SOLN Place 1 application onto the skin 2 (two) times daily. (Patient taking differently: Place 1 application onto the skin 2 (two) times daily as needed. ) 1 Bottle 3  . empagliflozin  (JARDIANCE) 25 MG TABS tablet Take 25 mg by mouth daily. 90 tablet 3  . famotidine (PEPCID) 20 MG tablet Take 1 tablet (20 mg total) by mouth 2 (two) times daily. (Patient taking differently: Take 20 mg by mouth daily as needed for heartburn. ) 60 tablet 3  . ferrous gluconate (IRON 27) 240 (27 FE) MG tablet Take 240 mg by mouth daily as needed (iron).     . gabapentin (NEURONTIN) 300 MG capsule Take 1 capsule (300 mg total) by mouth at bedtime. 90 capsule 4  . glipiZIDE (GLUCOTROL XL) 10 MG 24 hr tablet Take 1 tablet (10 mg total) by mouth daily with breakfast. 90 tablet 3  . ibuprofen (ADVIL,MOTRIN) 800 MG tablet Take 1 tablet (800 mg total) by mouth every 8 (eight) hours as needed. (Patient taking differently: Take 800 mg by mouth daily as needed for moderate pain. ) 60 tablet 0  . loratadine (CLARITIN) 10 MG tablet Take 10 mg by mouth  daily.    Marland Kitchen LORazepam (ATIVAN) 0.5 MG tablet Take 1 tablet (0.5 mg total) by mouth at bedtime as needed (Nausea or vomiting). 30 tablet 0  . losartan (COZAAR) 25 MG tablet Take 1 tablet by mouth once daily 30 tablet 0  . Multiple Vitamin (MULTIVITAMIN WITH MINERALS) TABS tablet Take 1 tablet by mouth daily.    Marland Kitchen omeprazole (PRILOSEC) 40 MG capsule Take 1 capsule (40 mg total) by mouth daily. 30 capsule 0  . ONETOUCH VERIO test strip USE TO TEST BLOOD SUGAR UP TO 4 TIMES DAILY 100 each 1  . silver sulfADIAZINE (SILVADENE) 1 % cream Apply 1 application topically 2 (two) times daily. 400 g 1  . sulfamethoxazole-trimethoprim (BACTRIM DS) 800-160 MG tablet Take 1 tablet by mouth 2 (two) times daily. 6 tablet 0  . tamoxifen (NOLVADEX) 20 MG tablet Take 1 tablet (20 mg total) by mouth daily. 90 tablet 12  . valACYclovir (VALTREX) 500 MG tablet Take twice daily for 3-5 days 30 tablet 12  . venlafaxine XR (EFFEXOR-XR) 37.5 MG 24 hr capsule Take 1 capsule (37.5 mg total) by mouth daily with breakfast. 90 capsule 4  . zolpidem (AMBIEN) 10 MG tablet TAKE 1 TABLET BY MOUTH AT  BEDTIME AS NEEDED FOR SLEEP 45 tablet 1   Current Facility-Administered Medications on File Prior to Visit  Medication Dose Route Frequency Provider Last Rate Last Dose  . sodium chloride 0.9 % 1,000 mL with potassium chloride 10 mEq infusion   Intravenous Once [name], [name], MD      . sodium chloride flush (NS) 0.9 % injection 10 mL  10 mL Intracatheter PRN [name], [name], MD   10 mL at 09/04/18 1425   Review of Systems  Constitutional: Negative for other unusual diaphoresis or sweats HENT: Negative for ear discharge or swelling Eyes: Negative for other worsening visual disturbances Respiratory: Negative for stridor or other swelling  Gastrointestinal: Negative for worsening distension or other blood Genitourinary: Negative for retention or other urinary change Musculoskeletal: Negative for other MSK pain or swelling Skin: Negative for color change or other new lesions Neurological: Negative for worsening tremors and other numbness  Psychiatric/Behavioral: Negative for worsening agitation or other fatigue All other system neg pe rpt    Objective:   Physical Exam BP 124/84   Pulse 92   Temp 98.1 F (36.7 C) (Oral)   Ht 5' 2"  (1.575 m)   Wt 253 lb (114.8 kg)   LMP 06/21/2010   SpO2 98%   BMI 46.27 kg/m  VS noted,  Constitutional: Pt appears in NAD HENT: Head: NCAT.  Right Ear: External ear normal.  Left Ear: External ear normal.  Eyes: . Pupils are equal, round, and reactive to light. Conjunctivae and EOM are normal Nose: without d/c or deformity Neck: Neck supple. Gross normal ROM Cardiovascular: Normal rate and regular rhythm.   Pulmonary/Chest: Effort normal and breath sounds without rales or wheezing.  Abd:  Soft, NT, ND, + BS, no organomegaly Neurological: Pt is alert. At baseline orientation, motor grossly intact Skin: Skin is warm. No rashes, other new lesions, no LE edema Psychiatric: Pt behavior is normal without agitation  No other exam findings Lab  Results  Component Value Date   WBC 2.7 (L) 03/12/2019   HGB 11.9 (L) 03/12/2019   HCT 37.7 03/12/2019   PLT 197 03/12/2019   GLUCOSE 312 (H) 03/12/2019   CHOL 199 10/03/2018   TRIG 202.0 (H) 10/03/2018   HDL 34.10 (L) 10/03/2018  LDLDIRECT 143.0 10/03/2018   LDLCALC 135 (H) 09/08/2017   ALT 21 03/12/2019   AST 15 03/12/2019   NA 134 (L) 03/12/2019   K 4.1 03/12/2019   CL 98 03/12/2019   CREATININE 0.82 03/12/2019   BUN 11 03/12/2019   CO2 26 03/12/2019   TSH 1.86 10/03/2018   HGBA1C 12.1 (H) 10/03/2018   MICROALBUR <0.7 10/03/2018   A1c 10.1 - POCT POCT glycosylated hemoglobin (Hb A1C) Order: [id number] Status:  Final result Visible to patient:  No (not released) Dx:  Type 2 diabetes mellitus with other s...  Ref Range & Units 11:05 (04/05/19) 41moago (10/03/18) 159yrgo (02/15/18) 1y279yro (09/08/17) 79yr11yr (07/01/16) 84yr 75yr(09/18/15) 25yr a50yr8/16/16)  Hemoglobin A1C 4.0 - 5.6 % 10.1Abnormal   12.1High  R, CM  7.4High  R, CM  9.9High  R, CM  7.3High  R, CM  7.8High  R, CM  7.5High  R, C           Assessment & Plan:

## 2019-04-05 NOTE — Assessment & Plan Note (Signed)
stable overall by history and exam, recent data reviewed with pt, and pt to continue medical treatment as before,  to f/u any worsening symptoms or concerns  

## 2019-04-05 NOTE — Assessment & Plan Note (Signed)
Most likely hemorrhoid related but also due for colonoscopy; for anusol HC supp, and refer colonoscopy

## 2019-04-05 NOTE — Patient Instructions (Addendum)
Your A1c was high today  Please take all new medication as prescribed - the generic actos 45 mg per day  Please take all new medication as prescribed - the hemorrhoid suppository as needed  You will be contacted regarding the referral for: colonoscopy  Please continue all other medications as before, and refills have been done if requested.  Please have the pharmacy call with any other refills you may need.  Please continue your efforts at being more active, low cholesterol diet, and weight control.  Please keep your appointments with your specialists as you may have planned  Please return in 6 months, or sooner if needed, with Lab testing done 3-5 days before

## 2019-04-05 NOTE — Assessment & Plan Note (Addendum)
Improved but still uncontrolled, recent data reviewed with pt, and pt to continue medical treatment as before but add actos 45 mg per day,  to f/u any worsening symptoms or concerns

## 2019-04-07 ENCOUNTER — Other Ambulatory Visit: Payer: Self-pay | Admitting: Internal Medicine

## 2019-04-11 ENCOUNTER — Other Ambulatory Visit: Payer: Self-pay

## 2019-04-11 ENCOUNTER — Ambulatory Visit (HOSPITAL_COMMUNITY)
Admission: RE | Admit: 2019-04-11 | Discharge: 2019-04-11 | Disposition: A | Discharge status: Home / Self Care | Payer: BC Managed Care – PPO | Source: Ambulatory Visit | Attending: Cardiology | Admitting: Cardiology

## 2019-04-11 ENCOUNTER — Ambulatory Visit (HOSPITAL_BASED_OUTPATIENT_CLINIC_OR_DEPARTMENT_OTHER)
Admission: RE | Admit: 2019-04-11 | Discharge: 2019-04-11 | Disposition: A | Discharge status: Home / Self Care | Payer: BC Managed Care – PPO | Source: Ambulatory Visit | Attending: Cardiology | Admitting: Cardiology

## 2019-04-11 VITALS — BP 114/58 | HR 74 | Wt 254.8 lb

## 2019-04-11 DIAGNOSIS — Y929 Unspecified place or not applicable: Secondary | ICD-10-CM | POA: Insufficient documentation

## 2019-04-11 DIAGNOSIS — I427 Cardiomyopathy due to drug and external agent: Secondary | ICD-10-CM

## 2019-04-11 DIAGNOSIS — T451X5A Adverse effect of antineoplastic and immunosuppressive drugs, initial encounter: Secondary | ICD-10-CM

## 2019-04-11 DIAGNOSIS — E119 Type 2 diabetes mellitus without complications: Secondary | ICD-10-CM | POA: Insufficient documentation

## 2019-04-11 DIAGNOSIS — J45909 Unspecified asthma, uncomplicated: Secondary | ICD-10-CM | POA: Insufficient documentation

## 2019-04-11 DIAGNOSIS — Z170 Estrogen receptor positive status [ER+]: Secondary | ICD-10-CM | POA: Diagnosis not present

## 2019-04-11 DIAGNOSIS — Z8249 Family history of ischemic heart disease and other diseases of the circulatory system: Secondary | ICD-10-CM | POA: Insufficient documentation

## 2019-04-11 DIAGNOSIS — Z801 Family history of malignant neoplasm of trachea, bronchus and lung: Secondary | ICD-10-CM | POA: Diagnosis not present

## 2019-04-11 DIAGNOSIS — Z923 Personal history of irradiation: Secondary | ICD-10-CM | POA: Insufficient documentation

## 2019-04-11 DIAGNOSIS — Z21 Asymptomatic human immunodeficiency virus [HIV] infection status: Secondary | ICD-10-CM | POA: Diagnosis not present

## 2019-04-11 DIAGNOSIS — Z800 Family history of malignant neoplasm of digestive organs: Secondary | ICD-10-CM | POA: Insufficient documentation

## 2019-04-11 DIAGNOSIS — Z90710 Acquired absence of both cervix and uterus: Secondary | ICD-10-CM | POA: Diagnosis not present

## 2019-04-11 DIAGNOSIS — C50411 Malignant neoplasm of upper-outer quadrant of right female breast: Secondary | ICD-10-CM

## 2019-04-11 DIAGNOSIS — Z853 Personal history of malignant neoplasm of breast: Secondary | ICD-10-CM | POA: Insufficient documentation

## 2019-04-11 DIAGNOSIS — Z833 Family history of diabetes mellitus: Secondary | ICD-10-CM | POA: Diagnosis not present

## 2019-04-11 MED ORDER — LOSARTAN POTASSIUM 25 MG PO TABS: 25.0000 mg | ORAL_TABLET | Freq: Every day | ORAL | 11 refills | Status: DC

## 2019-04-11 MED ORDER — CARVEDILOL 6.25 MG PO TABS: 6.2500 mg | ORAL_TABLET | Freq: Two times a day (BID) | ORAL | 11 refills | Status: DC

## 2019-04-11 NOTE — Patient Instructions (Signed)
Refills have been sent to your pharmacy for [facility] and [facility].   Your physician recommends that you schedule a follow-up appointment in: as needed. Please call our office if you need to be seen for any concerns  At the [facility], you and your health needs are our priority. As part of our continuing mission to provide you with exceptional heart care, we have created designated Provider Care Teams. These Care Teams include your primary Cardiologist (physician) and Advanced Practice Providers (APPs- Physician Assistants and Nurse Practitioners) who all work together to provide you with the care you need, when you need it.   You may see any of the following providers on your designated Care Team at your next follow up: Marland Kitchen Dr Glori Bickers . Dr Loralie Champagne . Darrick Grinder, NP   Please be sure to bring in all your medications bottles to every appointment.

## 2019-04-11 NOTE — Progress Notes (Signed)
Oncologist: Dr. Jana Hakim  48 y.o. with history of type II diabetes, HIV, and breast cancer was referred by Dr. Jana Hakim for cardio-oncology evaluation.  Breast cancer on right was diagnosed by biopsy in 6/19.  ER+/PR+/HER2+.  She has had chemotherapy then right lumpectomy in 8/19.  She completed radiation. She started Herceptin, then in 3/20 was noted to have a fall in her EF.  Herceptin was held and Coreg and losartan were started.  Subsequently, her EF improved and Herceptin was restarted.  She recently took her last dose.   No chest pain.  Dyspnea only when she walks a long distance.  She has developed some lymphedema in her right arm.    PMH: 1. Type 2 diabetes 2. Asthma 3. HIV 4. Breast cancer: On right, diagnosed by biopsy in 6/19.  ER+/PR+/HER2+.  She had chemotherapy then lumpectomy in 8/19.  She completed 1 year of Herceptin. - Echo (7/19): EF 55-60%, mild LVH, GLS -17.9%, normal RV size and systolic function.  - Echo (11/19): EF 55-60%, GLS -53.6%, normal diastolic function, normal RV.  - Echo (3/20): EF 45-50%, mild diffuse hypokinesis, normal RV size and systolic function, GLS -14.4%.  - Echo (4/20): EF 50-55%, GLS -20.8% - Echo (7/20): EF 55-60%, mild MR.   Social History   Socioeconomic History  . Marital status: Divorced    Spouse name: Not on file  . Number of children: Not on file  . Years of education: Not on file  . Highest education level: Not on file  Occupational History  . Not on file  Social Needs  . Financial resource strain: Not on file  . Food insecurity    Worry: Not on file    Inability: Not on file  . Transportation needs    Medical: No    Non-medical: No  Tobacco Use  . Smoking status: Never Smoker  . Smokeless tobacco: Never Used  Substance and Sexual Activity  . Alcohol use: Never    Alcohol/week: 0.0 standard drinks    Frequency: Never  . Drug use: No  . Sexual activity: Not Currently    Birth control/protection: Surgical    Comment: HAS  HAD A HYSTERECTOMY  Lifestyle  . Physical activity    Days per week: Not on file    Minutes per session: Not on file  . Stress: Not on file  Relationships  . Social Herbalist on phone: Not on file    Gets together: Not on file    Attends religious service: Not on file    Active member of club or organization: Not on file    Attends meetings of clubs or organizations: Not on file    Relationship status: Not on file  Other Topics Concern  . Not on file  Social History Narrative  . Not on file   Family History  Problem Relation Age of Onset  . Prostate cancer Other   . Heart disease Other   . Stroke Other   . Diabetes Mother   . Hypertension Mother   . Diabetes Father   . Cancer Father        COLON and LU NG  . Stroke Paternal Uncle   . Breast cancer Paternal Aunt   . Lung cancer Maternal Grandmother 65       Mesothelioma  . Colon cancer Paternal Grandfather        dx over 35s  . Lung cancer Paternal Aunt   . Breast cancer Paternal Aunt   .  Breast cancer Paternal Aunt   . Heart attack Maternal Grandfather 71  . Colon cancer Other        MGM's 5 brothers   ROS: All systems reviewed and negative except as per HPI.   BP (!) 114/58   Pulse 74   Wt 115.6 kg (254 lb 12.8 oz)   LMP 06/21/2010   SpO2 100%   BMI 46.60 kg/m  General: NAD, obese.  Neck: No JVD, no thyromegaly or thyroid nodule.  Lungs: Clear to auscultation bilaterally with normal respiratory effort. CV: Nondisplaced PMI.  Heart regular S1/S2, no S3/S4, no murmur.  No peripheral edema.  No carotid bruit.  Normal pedal pulses.  Abdomen: Soft, nontender, no hepatosplenomegaly, no distention.  Skin: Intact without lesions or rashes.  Neurologic: Alert and oriented x 3.  Psych: Normal affect. Extremities: No clubbing or cyanosis.  HEENT: Normal.   Assessment/Plan: 1. Chemotherapy-induced cardiomyopathy: Echo in 3/20 showed fall in EF and less negative strain.  This was concerning for  Herceptin-mediated cardiomyopathy.  She was started on Coreg and losartan, and Herceptin was held.  Herceptin was restarted after subsequent echoes showed improvement in LV function.  She has now completed Herceptin treatment.  I reviewed today's echo, EF 55%, overall normal.   - Now that she has finished Hercpetin and EF is normal, she does not need further screening echoes.  - I would continue Coreg and losartan for about 6 months after last Herceptin dose.  She can then stop them unless she needs them for BP control.   Followup prn.   Loralie Champagne 04/11/2019

## 2019-04-11 NOTE — Progress Notes (Signed)
  Echocardiogram 2D Echocardiogram has been performed.  Kelly Rowe 04/11/2019, 12:13 PM

## 2019-05-01 ENCOUNTER — Encounter (HOSPITAL_BASED_OUTPATIENT_CLINIC_OR_DEPARTMENT_OTHER): Payer: Self-pay | Admitting: *Deleted

## 2019-05-01 ENCOUNTER — Other Ambulatory Visit: Payer: Self-pay

## 2019-05-04 ENCOUNTER — Encounter (HOSPITAL_BASED_OUTPATIENT_CLINIC_OR_DEPARTMENT_OTHER)
Admission: RE | Admit: 2019-05-04 | Discharge: 2019-05-04 | Disposition: A | Discharge status: Home / Self Care | Payer: BC Managed Care – PPO | Source: Ambulatory Visit | Attending: General Surgery | Admitting: General Surgery

## 2019-05-04 ENCOUNTER — Other Ambulatory Visit (HOSPITAL_COMMUNITY)
Admission: RE | Admit: 2019-05-04 | Discharge: 2019-05-04 | Disposition: A | Discharge status: Home / Self Care | Payer: BC Managed Care – PPO | Source: Ambulatory Visit | Attending: General Surgery | Admitting: General Surgery

## 2019-05-04 ENCOUNTER — Other Ambulatory Visit: Payer: Self-pay

## 2019-05-04 DIAGNOSIS — Z01812 Encounter for preprocedural laboratory examination: Secondary | ICD-10-CM | POA: Diagnosis not present

## 2019-05-04 DIAGNOSIS — Z20828 Contact with and (suspected) exposure to other viral communicable diseases: Secondary | ICD-10-CM | POA: Diagnosis not present

## 2019-05-04 DIAGNOSIS — B20 Human immunodeficiency virus [HIV] disease: Secondary | ICD-10-CM | POA: Diagnosis not present

## 2019-05-04 DIAGNOSIS — K219 Gastro-esophageal reflux disease without esophagitis: Secondary | ICD-10-CM | POA: Diagnosis not present

## 2019-05-04 DIAGNOSIS — Z880 Allergy status to penicillin: Secondary | ICD-10-CM | POA: Diagnosis not present

## 2019-05-04 DIAGNOSIS — Z923 Personal history of irradiation: Secondary | ICD-10-CM | POA: Diagnosis not present

## 2019-05-04 DIAGNOSIS — C50911 Malignant neoplasm of unspecified site of right female breast: Secondary | ICD-10-CM | POA: Diagnosis not present

## 2019-05-04 DIAGNOSIS — M1990 Unspecified osteoarthritis, unspecified site: Secondary | ICD-10-CM | POA: Diagnosis not present

## 2019-05-04 DIAGNOSIS — Z8249 Family history of ischemic heart disease and other diseases of the circulatory system: Secondary | ICD-10-CM | POA: Diagnosis not present

## 2019-05-04 DIAGNOSIS — Z79899 Other long term (current) drug therapy: Secondary | ICD-10-CM | POA: Diagnosis not present

## 2019-05-04 DIAGNOSIS — E785 Hyperlipidemia, unspecified: Secondary | ICD-10-CM | POA: Diagnosis not present

## 2019-05-04 DIAGNOSIS — Z452 Encounter for adjustment and management of vascular access device: Secondary | ICD-10-CM | POA: Diagnosis not present

## 2019-05-04 DIAGNOSIS — F419 Anxiety disorder, unspecified: Secondary | ICD-10-CM | POA: Diagnosis not present

## 2019-05-04 DIAGNOSIS — Z7984 Long term (current) use of oral hypoglycemic drugs: Secondary | ICD-10-CM | POA: Diagnosis not present

## 2019-05-04 DIAGNOSIS — E119 Type 2 diabetes mellitus without complications: Secondary | ICD-10-CM | POA: Diagnosis not present

## 2019-05-04 DIAGNOSIS — Z833 Family history of diabetes mellitus: Secondary | ICD-10-CM | POA: Diagnosis not present

## 2019-05-04 DIAGNOSIS — Z9221 Personal history of antineoplastic chemotherapy: Secondary | ICD-10-CM | POA: Diagnosis not present

## 2019-05-04 DIAGNOSIS — Z6841 Body Mass Index (BMI) 40.0 and over, adult: Secondary | ICD-10-CM | POA: Diagnosis not present

## 2019-05-04 DIAGNOSIS — J4520 Mild intermittent asthma, uncomplicated: Secondary | ICD-10-CM | POA: Diagnosis not present

## 2019-05-04 DIAGNOSIS — Z881 Allergy status to other antibiotic agents status: Secondary | ICD-10-CM | POA: Diagnosis not present

## 2019-05-04 DIAGNOSIS — Z888 Allergy status to other drugs, medicaments and biological substances status: Secondary | ICD-10-CM | POA: Diagnosis not present

## 2019-05-04 DIAGNOSIS — Z803 Family history of malignant neoplasm of breast: Secondary | ICD-10-CM | POA: Diagnosis not present

## 2019-05-04 LAB — BASIC METABOLIC PANEL
Anion gap: 13 (ref 5–15)
BUN: 6 mg/dL (ref 6–20)
CO2: 25 mmol/L (ref 22–32)
Calcium: 9.1 mg/dL (ref 8.9–10.3)
Chloride: 98 mmol/L (ref 98–111)
Creatinine, Ser: 0.63 mg/dL (ref 0.44–1.00)
GFR calc Af Amer: >60 mL/min (ref >60)
GFR calc non Af Amer: >60 mL/min (ref >60)
Glucose, Bld: 174 mg/dL — ABNORMAL HIGH (ref 70–99)
Potassium: 4.3 mmol/L (ref 3.5–5.1)
Sodium: 136 mmol/L (ref 135–145)

## 2019-05-04 NOTE — Progress Notes (Signed)
Anesthesia consult per Dr. Turk, will proceed with surgery as scheduled.  

## 2019-05-07 LAB — NOVEL CORONAVIRUS, NAA (HOSP ORDER, SEND-OUT TO REF LAB; TAT 18-24 HRS): SARS-CoV-2, NAA: NOT DETECTED

## 2019-05-08 ENCOUNTER — Other Ambulatory Visit: Payer: Self-pay

## 2019-05-08 ENCOUNTER — Ambulatory Visit (HOSPITAL_BASED_OUTPATIENT_CLINIC_OR_DEPARTMENT_OTHER): Payer: BC Managed Care – PPO | Admitting: Anesthesiology

## 2019-05-08 ENCOUNTER — Encounter (HOSPITAL_BASED_OUTPATIENT_CLINIC_OR_DEPARTMENT_OTHER)
Admission: RE | Disposition: A | Discharge status: Home / Self Care | Payer: Self-pay | Source: Ambulatory Visit | Attending: General Surgery

## 2019-05-08 ENCOUNTER — Encounter (HOSPITAL_BASED_OUTPATIENT_CLINIC_OR_DEPARTMENT_OTHER): Payer: Self-pay | Admitting: Emergency Medicine

## 2019-05-08 ENCOUNTER — Ambulatory Visit (HOSPITAL_BASED_OUTPATIENT_CLINIC_OR_DEPARTMENT_OTHER)
Admission: RE | Admit: 2019-05-08 | Discharge: 2019-05-08 | Disposition: A | Discharge status: Home / Self Care | Payer: BC Managed Care – PPO | Source: Ambulatory Visit | Attending: General Surgery | Admitting: General Surgery

## 2019-05-08 DIAGNOSIS — J4520 Mild intermittent asthma, uncomplicated: Secondary | ICD-10-CM | POA: Insufficient documentation

## 2019-05-08 DIAGNOSIS — Z452 Encounter for adjustment and management of vascular access device: Secondary | ICD-10-CM | POA: Insufficient documentation

## 2019-05-08 DIAGNOSIS — Z6841 Body Mass Index (BMI) 40.0 and over, adult: Secondary | ICD-10-CM | POA: Insufficient documentation

## 2019-05-08 DIAGNOSIS — Z7984 Long term (current) use of oral hypoglycemic drugs: Secondary | ICD-10-CM | POA: Insufficient documentation

## 2019-05-08 DIAGNOSIS — Z923 Personal history of irradiation: Secondary | ICD-10-CM | POA: Insufficient documentation

## 2019-05-08 DIAGNOSIS — M1990 Unspecified osteoarthritis, unspecified site: Secondary | ICD-10-CM | POA: Insufficient documentation

## 2019-05-08 DIAGNOSIS — C50911 Malignant neoplasm of unspecified site of right female breast: Secondary | ICD-10-CM | POA: Insufficient documentation

## 2019-05-08 DIAGNOSIS — E119 Type 2 diabetes mellitus without complications: Secondary | ICD-10-CM | POA: Insufficient documentation

## 2019-05-08 DIAGNOSIS — Z9221 Personal history of antineoplastic chemotherapy: Secondary | ICD-10-CM | POA: Insufficient documentation

## 2019-05-08 DIAGNOSIS — F419 Anxiety disorder, unspecified: Secondary | ICD-10-CM | POA: Insufficient documentation

## 2019-05-08 DIAGNOSIS — Z8249 Family history of ischemic heart disease and other diseases of the circulatory system: Secondary | ICD-10-CM | POA: Insufficient documentation

## 2019-05-08 DIAGNOSIS — Z803 Family history of malignant neoplasm of breast: Secondary | ICD-10-CM | POA: Insufficient documentation

## 2019-05-08 DIAGNOSIS — Z880 Allergy status to penicillin: Secondary | ICD-10-CM | POA: Insufficient documentation

## 2019-05-08 DIAGNOSIS — Z79899 Other long term (current) drug therapy: Secondary | ICD-10-CM | POA: Insufficient documentation

## 2019-05-08 DIAGNOSIS — Z881 Allergy status to other antibiotic agents status: Secondary | ICD-10-CM | POA: Insufficient documentation

## 2019-05-08 DIAGNOSIS — B20 Human immunodeficiency virus [HIV] disease: Secondary | ICD-10-CM | POA: Insufficient documentation

## 2019-05-08 DIAGNOSIS — E785 Hyperlipidemia, unspecified: Secondary | ICD-10-CM | POA: Insufficient documentation

## 2019-05-08 DIAGNOSIS — Z888 Allergy status to other drugs, medicaments and biological substances status: Secondary | ICD-10-CM | POA: Insufficient documentation

## 2019-05-08 DIAGNOSIS — K219 Gastro-esophageal reflux disease without esophagitis: Secondary | ICD-10-CM | POA: Insufficient documentation

## 2019-05-08 DIAGNOSIS — Z833 Family history of diabetes mellitus: Secondary | ICD-10-CM | POA: Insufficient documentation

## 2019-05-08 HISTORY — PX: PORT-A-CATH REMOVAL: SHX5289

## 2019-05-08 LAB — GLUCOSE, CAPILLARY
Glucose-Capillary: 201 mg/dL — ABNORMAL HIGH (ref 70–99)
Glucose-Capillary: 161 mg/dL — ABNORMAL HIGH (ref 70–99)

## 2019-05-08 SURGERY — REMOVAL PORT-A-CATH
Anesthesia: Monitor Anesthesia Care | Site: Chest | Laterality: Left

## 2019-05-08 MED ORDER — CLINDAMYCIN PHOSPHATE 900 MG/50ML IV SOLN
INTRAVENOUS | Status: AC
  Filled 2019-05-08: qty 50

## 2019-05-08 MED ORDER — ONDANSETRON HCL 4 MG/2ML IJ SOLN
INTRAMUSCULAR | Status: DC | PRN
  Administered 2019-05-08: 4 mg via INTRAVENOUS

## 2019-05-08 MED ORDER — EPHEDRINE 5 MG/ML INJ
INTRAVENOUS | Status: AC
  Filled 2019-05-08: qty 10

## 2019-05-08 MED ORDER — GABAPENTIN 300 MG PO CAPS
300.0000 mg | ORAL_CAPSULE | ORAL | Status: AC
  Administered 2019-05-08: 300 mg via ORAL

## 2019-05-08 MED ORDER — OXYCODONE HCL 5 MG PO TABS: 5.0000 mg | ORAL_TABLET | Freq: Four times a day (QID) | ORAL | 0 refills | Status: DC | PRN

## 2019-05-08 MED ORDER — ACETAMINOPHEN 500 MG PO TABS
1000.0000 mg | ORAL_TABLET | ORAL | Status: AC
  Administered 2019-05-08: 1000 mg via ORAL

## 2019-05-08 MED ORDER — MIDAZOLAM HCL 2 MG/2ML IJ SOLN
INTRAMUSCULAR | Status: AC
  Filled 2019-05-08: qty 2

## 2019-05-08 MED ORDER — ACETAMINOPHEN 500 MG PO TABS
ORAL_TABLET | ORAL | Status: AC
  Filled 2019-05-08: qty 2

## 2019-05-08 MED ORDER — PROPOFOL 500 MG/50ML IV EMUL
INTRAVENOUS | Status: DC | PRN
  Administered 2019-05-08: 150 ug/kg/min via INTRAVENOUS

## 2019-05-08 MED ORDER — SUCCINYLCHOLINE CHLORIDE 200 MG/10ML IV SOSY
PREFILLED_SYRINGE | INTRAVENOUS | Status: AC
  Filled 2019-05-08: qty 10

## 2019-05-08 MED ORDER — MIDAZOLAM HCL 2 MG/2ML IJ SOLN
1.0000 mg | INTRAMUSCULAR | Status: DC | PRN
  Administered 2019-05-08: 2 mg via INTRAVENOUS

## 2019-05-08 MED ORDER — PHENYLEPHRINE 40 MCG/ML (10ML) SYRINGE FOR IV PUSH (FOR BLOOD PRESSURE SUPPORT)
PREFILLED_SYRINGE | INTRAVENOUS | Status: AC
  Filled 2019-05-08: qty 10

## 2019-05-08 MED ORDER — FENTANYL CITRATE (PF) 100 MCG/2ML IJ SOLN
INTRAMUSCULAR | Status: AC
  Filled 2019-05-08: qty 2

## 2019-05-08 MED ORDER — PROMETHAZINE HCL 25 MG/ML IJ SOLN: 6.2500 mg | INTRAMUSCULAR | Status: DC | PRN

## 2019-05-08 MED ORDER — SCOPOLAMINE 1 MG/3DAYS TD PT72: 1.0000 | MEDICATED_PATCH | Freq: Once | TRANSDERMAL | Status: DC

## 2019-05-08 MED ORDER — FENTANYL CITRATE (PF) 100 MCG/2ML IJ SOLN
50.0000 ug | INTRAMUSCULAR | Status: DC | PRN
  Administered 2019-05-08: 50 ug via INTRAVENOUS

## 2019-05-08 MED ORDER — ONDANSETRON HCL 4 MG/2ML IJ SOLN
INTRAMUSCULAR | Status: AC
  Filled 2019-05-08: qty 2

## 2019-05-08 MED ORDER — GABAPENTIN 300 MG PO CAPS
ORAL_CAPSULE | ORAL | Status: AC
  Filled 2019-05-08: qty 1

## 2019-05-08 MED ORDER — LIDOCAINE 2% (20 MG/ML) 5 ML SYRINGE
INTRAMUSCULAR | Status: AC
  Filled 2019-05-08: qty 5

## 2019-05-08 MED ORDER — CHLORHEXIDINE GLUCONATE CLOTH 2 % EX PADS: 6.0000 | MEDICATED_PAD | Freq: Once | CUTANEOUS | Status: DC

## 2019-05-08 MED ORDER — LACTATED RINGERS IV SOLN
INTRAVENOUS | Status: DC
  Administered 2019-05-08: 10:00:00 via INTRAVENOUS

## 2019-05-08 MED ORDER — FENTANYL CITRATE (PF) 100 MCG/2ML IJ SOLN: 25.0000 ug | INTRAMUSCULAR | Status: DC | PRN

## 2019-05-08 MED ORDER — CLINDAMYCIN PHOSPHATE 900 MG/50ML IV SOLN
900.0000 mg | INTRAVENOUS | Status: AC
  Administered 2019-05-08: 10:00:00 900 mg via INTRAVENOUS

## 2019-05-08 MED ORDER — LIDOCAINE-EPINEPHRINE (PF) 1 %-1:200000 IJ SOLN
INTRAMUSCULAR | Status: DC | PRN
  Administered 2019-05-08: 16 mL via INTRAMUSCULAR

## 2019-05-08 SURGICAL SUPPLY — 35 items
BLADE HEX COATED 2.75 (ELECTRODE) ×3 IMPLANT
BLADE SURG 15 STRL LF DISP TIS (BLADE) ×1 IMPLANT
BLADE SURG 15 STRL SS (BLADE) ×2
CANISTER SUCT 1200ML W/VALVE (MISCELLANEOUS) IMPLANT
CHLORAPREP W/TINT 26 (MISCELLANEOUS) ×3 IMPLANT
COVER BACK TABLE REUSABLE LG (DRAPES) ×3 IMPLANT
COVER MAYO STAND REUSABLE (DRAPES) ×3 IMPLANT
COVER WAND RF STERILE (DRAPES) IMPLANT
DECANTER SPIKE VIAL GLASS SM (MISCELLANEOUS) IMPLANT
DERMABOND ADVANCED (GAUZE/BANDAGES/DRESSINGS) ×2
DERMABOND ADVANCED .7 DNX12 (GAUZE/BANDAGES/DRESSINGS) ×1 IMPLANT
DRAPE LAPAROTOMY 100X72 PEDS (DRAPES) ×3 IMPLANT
DRAPE UTILITY XL STRL (DRAPES) ×3 IMPLANT
ELECT REM PT RETURN 9FT ADLT (ELECTROSURGICAL) ×3
ELECTRODE REM PT RTRN 9FT ADLT (ELECTROSURGICAL) ×1 IMPLANT
GLOVE BIO SURGEON STRL SZ 6 (GLOVE) ×3 IMPLANT
GLOVE BIO SURGEON STRL SZ 6.5 (GLOVE) ×2 IMPLANT
GLOVE BIO SURGEONS STRL SZ 6.5 (GLOVE) ×1
GLOVE BIOGEL PI IND STRL 6.5 (GLOVE) ×3 IMPLANT
GLOVE BIOGEL PI INDICATOR 6.5 (GLOVE) ×6
GOWN STRL REUS W/ TWL LRG LVL3 (GOWN DISPOSABLE) ×1 IMPLANT
GOWN STRL REUS W/TWL 2XL LVL3 (GOWN DISPOSABLE) ×3 IMPLANT
GOWN STRL REUS W/TWL LRG LVL3 (GOWN DISPOSABLE) ×2
NEEDLE HYPO 25X1 1.5 SAFETY (NEEDLE) ×3 IMPLANT
NS IRRIG 1000ML POUR BTL (IV SOLUTION) ×3 IMPLANT
PACK BASIN DAY SURGERY FS (CUSTOM PROCEDURE TRAY) ×3 IMPLANT
PENCIL BUTTON HOLSTER BLD 10FT (ELECTRODE) ×3 IMPLANT
SUT MNCRL AB 4-0 PS2 18 (SUTURE) ×3 IMPLANT
SUT VIC AB 3-0 SH 27 (SUTURE) ×2
SUT VIC AB 3-0 SH 27X BRD (SUTURE) ×1 IMPLANT
SYR CONTROL 10ML LL (SYRINGE) ×3 IMPLANT
TOWEL GREEN STERILE FF (TOWEL DISPOSABLE) ×3 IMPLANT
TUBE CONNECTING 20'X1/4 (TUBING)
TUBE CONNECTING 20X1/4 (TUBING) IMPLANT
YANKAUER SUCT BULB TIP NO VENT (SUCTIONS) IMPLANT

## 2019-05-08 NOTE — Op Note (Signed)
  PRE-OPERATIVE DIAGNOSIS:  un-needed Port-A-Cath for right breast cancer  POST-OPERATIVE DIAGNOSIS:  Same   PROCEDURE:  Procedure(s):  REMOVAL PORT-A-CATH  SURGEON:  Surgeon(s):  Stark Klein, MD  ANESTHESIA:   MAC + local  EBL:   Minimal  SPECIMEN:  None  Complications : none known  Procedure:   Pt was  identified in the holding area and taken to the operating room where she was placed supine on the operating room table.  MAC anesthesia was induced.  The left upper chest was prepped and draped.  The prior incision was anesthetized with local anesthetic.  The incision was opened with a #15 blade.  The subcutaneous tissue was divided with the cautery.  The port was identified and the capsule opened.  The four 2-0 prolene sutures were removed.  The port was then removed and pressure held on the tract.  The catheter appeared intact without evidence of breakage, length was 28 cm.  The wound was inspected for hemostasis, which was achieved with cautery.  The wound was closed with 3-0 vicryl deep dermal interrupted sutures and 4-0 Monocryl running subcuticular suture.  The wound was cleaned, dried, and dressed with dermabond.  The patient was awakened from anesthesia and taken to the PACU in stable condition.  Needle, sponge, and instrument counts are correct.

## 2019-05-08 NOTE — H&P (Signed)
Kelly Rowe is an 48 y.o. female.   Chief Complaint: h/o right breast cancer HPI:  Pt is a 48 yo F who is s/p treatment for right breast cancer including chemotherapy.  She has completed treatment and desires port removal.    Past Medical History:  Diagnosis Date  . ANEMIA-IRON DEFICIENCY 01/30/2010  . ANXIETY 11/27/2007  . Arthritis   . Asthma 02/25/2011  . Cancer ([place])    breast  . Carbuncle and furuncle of trunk 04/16/2010  . Chlamydia infection 03/21/2008  . DIABETES MELLITUS, TYPE II 08/02/2007  . Edema 01/30/2010  . ELEVATED BLOOD PRESSURE WITHOUT DIAGNOSIS OF HYPERTENSION 08/02/2007  . Family history of breast cancer   . Family history of colon cancer   . Family history of lung cancer   . FREQUENCY, URINARY 05/19/2009  . GENITAL HERPES 03/12/2009  . GERD (gastroesophageal reflux disease)   . History of kidney stones   . History of radiation therapy 07/27/18- 09/06/18   Right Breast, Right Axillary and Supraclavicular nodes 50 Gy in 25 fractions, Right Breast Boost 10 Gy in 5 fractions  . HIV (human immunodeficiency virus infection) ([place]) 2009  . HIV INFECTION 03/12/2009  . HSV (herpes simplex virus) infection   . HYPERLIPIDEMIA 08/02/2007  . Hypertension   . Metrorrhagia 03/21/2008  . PONV (postoperative nausea and vomiting)    woke up crying per patient   . RETENTION, URINE 05/19/2009  . Trichomonas infection 01/19/2010  . VITAMIN D DEFICIENCY 01/30/2010    Past Surgical History:  Procedure Laterality Date  . ABDOMINAL HYSTERECTOMY  07/22/2010   TAH WITH PRESERVATION OF BOTH TUBES AND OVARIES  . BREAST LUMPECTOMY WITH RADIOACTIVE SEED AND SENTINEL LYMPH NODE BIOPSY Right 02/23/2018   Procedure: BREAST LUMPECTOMY WITH RADIOACTIVE SEED AND SENTINEL LYMPH NODE BIOPSY;  Surgeon: Stark Klein, MD;  Location: [facility];  Service: General;  Laterality: Right;  . CHOLECYSTECTOMY    . ENDOMETRIAL ABLATION  01/11/2008   HER OPTION  . ESOPHAGOGASTRODUODENOSCOPY    . MULTIPLE TOOTH  EXTRACTIONS    . PORTACATH PLACEMENT Left 02/23/2018   Procedure: INSERTION PORT-A-CATH;  Surgeon: Stark Klein, MD;  Location: [facility];  Service: General;  Laterality: Left;  . PORTACATH PLACEMENT N/A 04/21/2018   Procedure: PORT-A-CATH REVISION;  Surgeon: Stark Klein, MD;  Location: [facility];  Service: General;  Laterality: N/A;  . TUBAL LIGATION    . WISDOM TOOTH EXTRACTION      Family History  Problem Relation Age of Onset  . Prostate cancer Other   . Heart disease Other   . Stroke Other   . Diabetes Mother   . Hypertension Mother   . Diabetes Father   . Cancer Father        COLON and LU NG  . Stroke Paternal Uncle   . Breast cancer Paternal Aunt   . Lung cancer Maternal Grandmother 59       Mesothelioma  . Colon cancer Paternal Grandfather        dx over 72s  . Lung cancer Paternal Aunt   . Breast cancer Paternal Aunt   . Breast cancer Paternal Aunt   . Heart attack Maternal Grandfather 71  . Colon cancer Other        MGM's 5 brothers   Social History:  reports that she has never smoked. She has never used smokeless tobacco. She reports current alcohol use. She reports that she does not use drugs.  Allergies:  Allergies  Allergen Reactions  . Metformin  And Related Other (See Comments)    Bowel frequency  . Levaquin [Levofloxacin In D5w] Nausea And Vomiting and Rash  . Penicillins Swelling and Rash    Has patient had a PCN reaction causing immediate rash, facial/tongue/throat swelling, SOB or lightheadedness with hypotension: Yes Has patient had a PCN reaction causing severe rash involving mucus membranes or skin necrosis: No Has patient had a PCN reaction that required hospitalization: Yes Has patient had a PCN reaction occurring within the last 10 years: No If all of the above answers are "NO", then may proceed with Cephalosporin use.    Medications Prior to Admission  Medication Sig Dispense Refill  . albuterol (PROVENTIL HFA;VENTOLIN HFA) 108 (90 Base) MCG/ACT  inhaler Inhale 2 puffs into the lungs every 6 (six) hours as needed for wheezing or shortness of breath.    . Azelastine-Fluticasone (DYMISTA) 137-50 MCG/ACT SUSP Use as directed 1 spray each side twice per day as needed 23 g 5  . Beclomethasone Dipropionate (QNASL) 80 MCG/ACT AERS Place 2 sprays into the nose daily. 1 Inhaler 1  . bictegravir-emtricitabine-tenofovir AF (BIKTARVY) 50-200-25 MG TABS tablet Take 1 tablet by mouth daily.    . carvedilol (COREG) 6.25 MG tablet Take 1 tablet (6.25 mg total) by mouth 2 (two) times daily. 60 tablet 11  . Diclofenac Sodium (PENNSAID) 2 % SOLN Place 1 application onto the skin 2 (two) times daily. (Patient taking differently: Place 1 application onto the skin 2 (two) times daily as needed. ) 1 Bottle 3  . Dulaglutide (TRULICITY) 1.5 XY/8.0XK SOPN 0.5 ml weekly SQ 6 mL 3  . empagliflozin (JARDIANCE) 25 MG TABS tablet Take 25 mg by mouth daily. 90 tablet 3  . ferrous gluconate (IRON 27) 240 (27 FE) MG tablet Take 240 mg by mouth daily as needed (iron).     Marland Kitchen glipiZIDE (GLUCOTROL XL) 10 MG 24 hr tablet Take 1 tablet (10 mg total) by mouth daily with breakfast. 90 tablet 3  . hydrocortisone (ANUSOL-HC) 25 MG suppository Place 1 suppository (25 mg total) rectally every 12 (twelve) hours. 12 suppository 1  . ibuprofen (ADVIL,MOTRIN) 800 MG tablet Take 1 tablet (800 mg total) by mouth every 8 (eight) hours as needed. (Patient taking differently: Take 800 mg by mouth daily as needed for moderate pain. ) 60 tablet 0  . loratadine (CLARITIN) 10 MG tablet Take 10 mg by mouth daily.    Marland Kitchen LORazepam (ATIVAN) 0.5 MG tablet Take 1 tablet (0.5 mg total) by mouth at bedtime as needed (Nausea or vomiting). 30 tablet 0  . losartan (COZAAR) 25 MG tablet Take 1 tablet (25 mg total) by mouth daily. 30 tablet 11  . Multiple Vitamin (MULTIVITAMIN WITH MINERALS) TABS tablet Take 1 tablet by mouth daily.    . pioglitazone (ACTOS) 45 MG tablet Take 1 tablet (45 mg total) by mouth daily.  90 tablet 3  . valACYclovir (VALTREX) 500 MG tablet Take twice daily for 3-5 days 30 tablet 12  . venlafaxine XR (EFFEXOR-XR) 37.5 MG 24 hr capsule Take 1 capsule (37.5 mg total) by mouth daily with breakfast. 90 capsule 4  . zolpidem (AMBIEN) 10 MG tablet TAKE 1 TABLET BY MOUTH AT BEDTIME AS NEEDED FOR SLEEP 45 tablet 1  . blood glucose meter kit and supplies Dispense based on patient and insurance preference. Use up to four times daily as directed. (FOR ICD-10 E10.9, E11.9). 1 each 0  . gabapentin (NEURONTIN) 300 MG capsule Take 1 capsule (300 mg total) by mouth  at bedtime. 90 capsule 4  . Lancets (ONETOUCH DELICA PLUS ANVBTY60A) MISC USE TO TEST BLOOD SUGAR UP TO 4 TIMES DAILY 100 each 5  . omeprazole (PRILOSEC) 40 MG capsule Take 1 capsule (40 mg total) by mouth daily. 30 capsule 0  . ONETOUCH VERIO test strip USE TO TEST BLOOD SUGAR UP TO 4 TIMES DAILY 100 each 1    Results for orders placed or performed during the hospital encounter of 05/08/19 (from the past 48 hour(s))  Glucose, capillary     Status: Abnormal   Collection Time: 05/08/19  9:56 AM  Result Value Ref Range   Glucose-Capillary 201 (H) 70 - 99 mg/dL   No results found.  Review of Systems  All other systems reviewed and are negative.   Blood pressure (!) 112/58, pulse 83, temperature 97.6 F (36.4 C), temperature source Oral, resp. rate 18, height _0  (1.575 m), weight 116.4 kg, last menstrual period 06/21/2010, SpO2 100 %. Physical Exam  Constitutional: She is oriented to person, place, and time. She appears well-developed and well-nourished. No distress.  HENT:  Head: Normocephalic and atraumatic.  Right Ear: External ear normal.  Left Ear: External ear normal.  Eyes: Pupils are equal, round, and reactive to light. No scleral icterus.  Neck: Neck supple.  Cardiovascular: Normal rate.  Respiratory: Effort normal. No respiratory distress.  Left subclavian port in place.  GI: Soft.  Musculoskeletal: Normal  range of motion.  Neurological: She is alert and oriented to person, place, and time. Coordination normal.  Skin: Skin is warm and dry. She is not diaphoretic.  Psychiatric: She has a normal mood and affect. Her behavior is normal. Judgment and thought content normal.     Assessment/Plan Port in place. H/o triple positive right breast cancer with chemotherapy.  Plan port removal.  Reviewed surgery and risks.    Stark Klein, MD 05/08/2019, 10:51 AM

## 2019-05-08 NOTE — Discharge Instructions (Addendum)
[facility] Office Phone Number [phone]   POST OP INSTRUCTIONS  Always review your discharge instruction sheet given to you by the facility where your surgery was performed.  IF YOU HAVE DISABILITY OR FAMILY LEAVE FORMS, YOU MUST BRING THEM TO THE OFFICE FOR PROCESSING.  DO NOT GIVE THEM TO YOUR DOCTOR.  1. A prescription for pain medication may be given to you upon discharge.  Take your pain medication as prescribed, if needed.  If narcotic pain medicine is not needed, then you may take acetaminophen (Tylenol) or ibuprofen (Advil) as needed. 2. Take your usually prescribed medications unless otherwise directed 3. If you need a refill on your pain medication, please contact your pharmacy.  They will contact our office to request authorization.  Prescriptions will not be filled after 5pm or on week-ends. 4. You should eat very light the first 24 hours after surgery, such as soup, crackers, pudding, etc.  Resume your normal diet the day after surgery 5. It is common to experience some constipation if taking pain medication after surgery.  Increasing fluid intake and taking a stool softener will usually help or prevent this problem from occurring.  A mild laxative (Milk of Magnesia or Miralax) should be taken according to package directions if there are no bowel movements after 48 hours. 6. You may shower in 48 hours.  The surgical glue will flake off in 2-3 weeks.   7. ACTIVITIES:  No strenuous activity or heavy lifting for 1 week.   a. You may drive when you no longer are taking prescription pain medication, you can comfortably wear a seatbelt, and you can safely maneuver your car and apply brakes. b. RETURN TO WORK:  __________1 week if applicable_______________ Dennis Bast should see your doctor in the office for a follow-up appointment approximately three-four weeks after your surgery.    WHEN TO CALL YOUR DOCTOR: 1. Fever over 101.0 2. Nausea and/or vomiting. 3. Extreme  swelling or bruising. 4. Continued bleeding from incision. 5. Increased pain, redness, or drainage from the incision.  The clinic staff is available to answer your questions during regular business hours.  Please dont hesitate to call and ask to speak to one of the nurses for clinical concerns.  If you have a medical emergency, go to the nearest emergency room or call 911.  A surgeon from [facility] is always on call at the hospital.  For further questions, please visit [email or web address]    May take next dose of Tylenol at 3:45 PM on 05/08/2019 if needed.    Post Anesthesia Home Care Instructions  Activity: Get plenty of rest for the remainder of the day. A responsible individual must stay with you for 24 hours following the procedure.  For the next 24 hours, DO NOT: -Drive a car -Paediatric nurse -Drink alcoholic beverages -Take any medication unless instructed by your physician -Make any legal decisions or sign important papers.  Meals: Start with liquid foods such as gelatin or soup. Progress to regular foods as tolerated. Avoid greasy, spicy, heavy foods. If nausea and/or vomiting occur, drink only clear liquids until the nausea and/or vomiting subsides. Call your physician if vomiting continues.  Special Instructions/Symptoms: Your throat may feel dry or sore from the anesthesia or the breathing tube placed in your throat during surgery. If this causes discomfort, gargle with warm salt water. The discomfort should disappear within 24 hours.  If you had a scopolamine patch placed behind your ear for the management of post- operative nausea  and/or vomiting:  1. The medication in the patch is effective for 72 hours, after which it should be removed.  Wrap patch in a tissue and discard in the trash. Wash hands thoroughly with soap and water. 2. You may remove the patch earlier than 72 hours if you experience unpleasant side effects which may include dry  mouth, dizziness or visual disturbances. 3. Avoid touching the patch. Wash your hands with soap and water after contact with the patch.

## 2019-05-08 NOTE — Anesthesia Postprocedure Evaluation (Signed)
Anesthesia Post Note  Patient: Kelly Rowe  Procedure(s) Performed: REMOVAL PORT-A-CATH (Left Chest)     Patient location during evaluation: PACU Anesthesia Type: MAC Level of consciousness: awake and alert Pain management: pain level controlled Vital Signs Assessment: post-procedure vital signs reviewed and stable Respiratory status: spontaneous breathing, nonlabored ventilation and respiratory function stable Cardiovascular status: stable and blood pressure returned to baseline Postop Assessment: no apparent nausea or vomiting Anesthetic complications: no    Last Vitals:  Vitals:   05/08/19 1215 05/08/19 1230  BP: 104/78 124/80  Pulse: 76 78  Resp: 19 16  Temp:  36.6 C  SpO2: 99% 100%    Last Pain:  Vitals:   05/08/19 1230  TempSrc:   PainSc: 0-No pain                 Catalina Gravel

## 2019-05-08 NOTE — Anesthesia Preprocedure Evaluation (Addendum)
Anesthesia Evaluation  Patient identified by MRN, date of birth, ID band Patient awake    Reviewed: Allergy & Precautions, NPO status , Patient's Chart, lab work & pertinent test results, reviewed documented beta blocker date and time   History of Anesthesia Complications (+) PONV and history of anesthetic complications  Airway Mallampati: II  TM Distance: >3 FB Neck ROM: Full    Dental  (+) Dental Advisory Given, Missing   Pulmonary asthma ,    Pulmonary exam normal breath sounds clear to auscultation       Cardiovascular hypertension, Pt. on home beta blockers and Pt. on medications Normal cardiovascular exam Rhythm:Regular Rate:Normal     Neuro/Psych PSYCHIATRIC DISORDERS Anxiety negative neurological ROS     GI/Hepatic Neg liver ROS, GERD  Medicated,  Endo/Other  diabetes, Type 2, Oral Hypoglycemic Agents  Renal/GU negative Renal ROS     Musculoskeletal  (+) Arthritis ,   Abdominal   Peds  Hematology  (+) Blood dyscrasia, anemia , HIV,   Anesthesia Other Findings Day of surgery medications reviewed with the patient.  RIGHT BREAST CANCER  Reproductive/Obstetrics                            Anesthesia Physical Anesthesia Plan  ASA: III  Anesthesia Plan: MAC   Post-op Pain Management:    Induction: Intravenous  PONV Risk Score and Plan: 3 and Propofol infusion and Treatment may vary due to age or medical condition  Airway Management Planned: Nasal Cannula and Natural Airway  Additional Equipment:   Intra-op Plan:   Post-operative Plan:   Informed Consent: I have reviewed the patients History and Physical, chart, labs and discussed the procedure including the risks, benefits and alternatives for the proposed anesthesia with the patient or authorized representative who has indicated his/her understanding and acceptance.     Dental advisory given  Plan Discussed with:  CRNA and Anesthesiologist  Anesthesia Plan Comments:        Anesthesia Quick Evaluation

## 2019-05-08 NOTE — Transfer of Care (Signed)
Immediate Anesthesia Transfer of Care Note  Patient: Kelly Rowe  Procedure(s) Performed: REMOVAL PORT-A-CATH (Left Chest)  Patient Location: PACU  Anesthesia Type:MAC  Level of Consciousness: awake, alert  and oriented  Airway & Oxygen Therapy: Patient Spontanous Breathing and Patient connected to face mask oxygen  Post-op Assessment: Report given to RN and Post -op Vital signs reviewed and stable  Post vital signs: Reviewed and stable  Last Vitals:  Vitals Value Taken Time  BP    Temp    Pulse 86 05/08/19 1148  Resp 16 05/08/19 1148  SpO2 100 % 05/08/19 1148  Vitals shown include unvalidated device data.  Last Pain:  Vitals:   05/08/19 0939  TempSrc: Oral  PainSc: 0-No pain         Complications: No apparent anesthesia complications

## 2019-05-09 ENCOUNTER — Encounter (HOSPITAL_BASED_OUTPATIENT_CLINIC_OR_DEPARTMENT_OTHER): Payer: Self-pay | Admitting: General Surgery

## 2019-05-09 NOTE — Addendum Note (Signed)
Addendum  created 05/09/19 1232 by Camila Maita, Ernesta Amble, CRNA   Charge Capture section accepted

## 2019-05-14 ENCOUNTER — Ambulatory Visit (INDEPENDENT_AMBULATORY_CARE_PROVIDER_SITE_OTHER): Payer: BC Managed Care – PPO | Admitting: Gastroenterology

## 2019-05-14 ENCOUNTER — Encounter: Payer: Self-pay | Admitting: Gastroenterology

## 2019-05-14 ENCOUNTER — Other Ambulatory Visit: Payer: Self-pay

## 2019-05-14 VITALS — BP 110/80 | HR 84 | Temp 98.4°F | Ht 61.0 in | Wt 256.4 lb

## 2019-05-14 DIAGNOSIS — K625 Hemorrhage of anus and rectum: Secondary | ICD-10-CM

## 2019-05-14 MED ORDER — NA SULFATE-K SULFATE-MG SULF 17.5-3.13-1.6 GM/177ML PO SOLN: 1.0000 | Freq: Once | ORAL | 0 refills | Status: AC

## 2019-05-14 NOTE — Patient Instructions (Addendum)
I have recommended a colonoscopy.  Tips for colonoscopy:  - Stay well hydrated for 3-4 days prior to the exam. This reduces nausea and dehydration.  - To prevent skin/hemorrhoid irritation - prior to wiping, put A&Dointment or vaseline on the toilet paper. - Keep a towel or pad on the bed.  - Drink  64oz of clear liquids in the morning of prep day (prior to starting the prep) to be sure that there is enough fluid to flush the colon and stay hydrated!!!! This is in addition to the fluids required for preparation. - Use of a flavored hard candy, such as grape Anise Salvo, can counteract some of the flavor of the prep and may prevent some nausea.   Due to recent COVID-19 restrictions implemented by [employer] and state authorities and in an effort to keep both patients and staff as safe as possible, [facility] requires COVID-19 testing prior to any scheduled endoscopic procedure. The testing center is located at [address] [place], [place] [address] in the [facility] [employer]  suite.  Your appointment has been scheduled for 10:00 am on 06/12/2019.   Please bring your insurance cards to this appointment. You will require your COVID screen 2 business days prior to your endoscopic procedure.  You are not required to quarantine after your screening.  You will only receive a phone call with the results if it is POSITIVE.  If you do not receive a call the day before your procedure you should begin your prep, if ordered, and you should report to the endo center for your procedure at your designated appointment arrival time ( one hour prior to the procedure time). There is no cost to you for the screening on the day of the swab.  [facility] will file with your insurance company for the testing.    You may receive an automated phone call prior to your procedure or have a message in your MyChart that you have an appointment for a BP/15 at  the [facility], please disregard this message.  Your testing will be at the [place] [place] location.   If you are leaving [facility] travel [place] on [address], turn left onto [address], turn night onto [address] at the 1st stop light turn right, pass the [employer] on your right and proceed to [place] (white building).

## 2019-05-14 NOTE — Progress Notes (Signed)
Referring Provider: Biagio Borg, MD Primary Care Physician:  Biagio Borg, MD   Chief complaint: Rectal pain, rectal bleeding, and constipation  IMPRESSION:  Constipation worsened by chemotherapy Hematochezia/rectal pain, rectal bleeding the patient attributes to hemorrhoids Chemotherapy-induced thrombocytopenia, now resolved Family history of colon cancer (father at age 50) No prior colon cancer screening  Colonoscopy recommended to evaluate for the source of her hematochezia. Agreed to perform a full rectal exam under sedation.   Reviewed management options for medication-related constipation.  Continue daily Metamucil with MiraLAX as needed.  PLAN: Continue to drink at least 64 ounces of water daily, follow a high-fiber diet Continue daily Metamucil with MiraLAX as needed Colonoscopy with 2 day prep  I consented the patient discussing the risks, benefits, and alternatives to endoscopic evaluation. In particular, we discussed the risks that include, but are not limited to, reaction to medication, cardiopulmonary compromise, bleeding requiring blood transfusion, aspiration resulting in pneumonia, perforation requiring surgery, lack of diagnosis, severe illness requiring hospitalization, and even death. We reviewed the risk of missed lesion including polyps or even cancer. The patient acknowledges these risks and asks that we proceed.   HPI: Kelly Rowe is a 48 y.o. female who returns in follow-up for hematochezia.  The interval history is obtained through the patient and review of her electronic health record.  She is recently completed treatment for triple positive breast cancer diagnosed in June 2019 treated with adjuvant chemotherapy.  She had her port removed last week.  Chemotherapy was complicated by bloody diarrhea (8-10 bowel movements daily).  Since completing chemotherapy she has had more difficulty with constipation.  Some constipation.  Intermittent bleeding and  pain that she attributes to hemorrhoids.  Taking Metamucil daily.  Rare need for Miralax. Took a colon cleanse and this seemed to provide significant relief.  Drinking 88 ounces of water as well a 12 ounces of Gatorade.  Tries to increase the fiber in her diet with greans.  Currently having a BM daily.  No significant straining.  No sense of incomplete evacuation. Hemorrhoids are still a problem despite these efforts.   No prior endoscopic evaluation.  She has heartburn that is well controlled on omeprazole.   Her father was diagnosed with colon cancer at age 64 but ultimately died of lung cancer there is no other known family history of colon cancer or colon polyps.   Past Medical History:  Diagnosis Date   ANEMIA-IRON DEFICIENCY 01/30/2010   ANXIETY 11/27/2007   Arthritis    Asthma 02/25/2011   Cancer ([place])    breast   Carbuncle and furuncle of trunk 04/16/2010   Chlamydia infection 03/21/2008   DIABETES MELLITUS, TYPE II 08/02/2007   Edema 01/30/2010   ELEVATED BLOOD PRESSURE WITHOUT DIAGNOSIS OF HYPERTENSION 08/02/2007   Family history of breast cancer    Family history of colon cancer    Family history of lung cancer    FREQUENCY, URINARY 05/19/2009   GENITAL HERPES 03/12/2009   GERD (gastroesophageal reflux disease)    History of kidney stones    History of radiation therapy 07/27/18- 09/06/18   Right Breast, Right Axillary and Supraclavicular nodes 50 Gy in 25 fractions, Right Breast Boost 10 Gy in 5 fractions   HIV (human immunodeficiency virus infection) ([place]) 2009   HIV INFECTION 03/12/2009   HSV (herpes simplex virus) infection    HYPERLIPIDEMIA 08/02/2007   Hypertension    Metrorrhagia 03/21/2008   PONV (postoperative nausea and vomiting)    woke up  crying per patient    RETENTION, URINE 05/19/2009   Trichomonas infection 01/19/2010   VITAMIN D DEFICIENCY 01/30/2010    Past Surgical History:  Procedure Laterality Date   ABDOMINAL HYSTERECTOMY   07/22/2010   TAH WITH PRESERVATION OF BOTH TUBES AND OVARIES   BREAST LUMPECTOMY WITH RADIOACTIVE SEED AND SENTINEL LYMPH NODE BIOPSY Right 02/23/2018   Procedure: BREAST LUMPECTOMY WITH RADIOACTIVE SEED AND SENTINEL LYMPH NODE BIOPSY;  Surgeon: Stark Klein, MD;  Location: [place];  Service: General;  Laterality: Right;   CHOLECYSTECTOMY     ENDOMETRIAL ABLATION  01/11/2008   HER OPTION   ESOPHAGOGASTRODUODENOSCOPY     MULTIPLE TOOTH EXTRACTIONS     PORT-A-CATH REMOVAL Left 05/08/2019   Procedure: REMOVAL PORT-A-CATH;  Surgeon: Stark Klein, MD;  Location: [place];  Service: General;  Laterality: Left;   PORTACATH PLACEMENT Left 02/23/2018   Procedure: INSERTION PORT-A-CATH;  Surgeon: Stark Klein, MD;  Location: [place];  Service: General;  Laterality: Left;   PORTACATH PLACEMENT N/A 04/21/2018   Procedure: PORT-A-CATH REVISION;  Surgeon: Stark Klein, MD;  Location: [facility];  Service: General;  Laterality: N/A;   TUBAL LIGATION     WISDOM TOOTH EXTRACTION      Prior to Admission medications   Medication Sig Start Date End Date Taking? Authorizing Provider  albuterol (PROVENTIL HFA;VENTOLIN HFA) 108 (90 Base) MCG/ACT inhaler Inhale 2 puffs into the lungs every 6 (six) hours as needed for wheezing or shortness of breath.   Yes [provider]  aspirin 81 MG tablet Take 81 mg by mouth daily as needed (chest pain).    Yes [provider]  Azelastine-Fluticasone (DYMISTA) 137-50 MCG/ACT SUSP Use as directed 1 spray each side twice per day as needed 09/15/17  Yes Biagio Borg, MD  Beclomethasone Dipropionate (QNASL) 80 MCG/ACT AERS Place 2 sprays into the nose daily. 09/08/17  Yes Hoyt Koch, MD  bictegravir-emtricitabine-tenofovir AF (BIKTARVY) 50-200-25 MG TABS tablet Take 1 tablet by mouth daily.   Yes [provider]  blood glucose meter kit and supplies Dispense based on patient and insurance preference. Use up to four times daily  as directed. (FOR ICD-10 E10.9, E11.9). 09/27/17  Yes Biagio Borg, MD  Diclofenac Sodium (PENNSAID) 2 % SOLN Place 1 application onto the skin 2 (two) times daily. Patient taking differently: Place 1 application onto the skin 2 (two) times daily as needed.  11/30/17  Yes Rosemarie Ax, MD  Dulaglutide (TRULICITY) 1.5 ZS/0.1UX SOPN 0.5 ml weekly SQ 04/10/18  Yes Biagio Borg, MD  famotidine (PEPCID) 20 MG tablet Take 1 tablet (20 mg total) by mouth 2 (two) times daily. Patient taking differently: Take 20 mg by mouth daily as needed for heartburn.  03/13/18  Yes [name], [name], MD  ferrous gluconate (IRON 27) 240 (27 FE) MG tablet Take 240 mg by mouth daily as needed (iron).    Yes [provider]  glipiZIDE (GLIPIZIDE XL) 5 MG 24 hr tablet Take 1 tablet (5 mg total) by mouth daily with breakfast. 09/27/17  Yes Biagio Borg, MD  ibuprofen (ADVIL,MOTRIN) 800 MG tablet Take 1 tablet (800 mg total) by mouth every 8 (eight) hours as needed. Patient taking differently: Take 800 mg by mouth daily as needed for moderate pain.  11/28/17  Yes Biagio Borg, MD  lidocaine-prilocaine (EMLA) cream Apply to affected area once Patient taking differently: Apply 1 application topically once. Apply to affected area once 03/03/18  Yes [name],  [name], MD  loratadine (CLARITIN) 10 MG tablet Take 10 mg by mouth daily.   Yes [provider]  LORazepam (ATIVAN) 0.5 MG tablet Take 1 tablet (0.5 mg total) by mouth at bedtime as needed (Nausea or vomiting). 03/03/18  Yes [name], [name], MD  metFORMIN (GLUCOPHAGE-XR) 500 MG 24 hr tablet Take 2 tablets (1,000 mg total) by mouth daily with breakfast. Annual appt is due must see provider for future refills 09/27/17  Yes Biagio Borg, MD  Multiple Vitamin (MULTIVITAMIN WITH MINERALS) TABS tablet Take 1 tablet by mouth daily.   Yes [provider]  omeprazole (PRILOSEC) 40 MG capsule Take 1 capsule (40 mg total) by mouth daily. 03/15/18  Yes Causey,  Charlestine Massed, NP  ondansetron (ZOFRAN) 8 MG tablet Take 1 tablet (8 mg total) by mouth every 8 (eight) hours as needed for nausea or vomiting. 03/15/18  Yes Causey, Charlestine Massed, NP  oxyCODONE (OXY IR/ROXICODONE) 5 MG immediate release tablet Take 1-2 tablets (5-10 mg total) by mouth every 6 (six) hours as needed for moderate pain, severe pain or breakthrough pain. 04/21/18  Yes Stark Klein, MD  potassium chloride (K-DUR) 10 MEQ tablet Take 1 tablet (10 mEq total) by mouth daily. Take 40 meq BID x 2 days then 20 meq BID x 5 days 04/26/18  Yes Causey, Charlestine Massed, NP  prochlorperazine (COMPAZINE) 10 MG tablet Take 1 tablet (10 mg total) by mouth every 6 (six) hours as needed (Nausea or vomiting). 03/03/18  Yes [name], [name], MD  valACYclovir (VALTREX) 500 MG tablet Take twice daily for 3-5 days 06/08/17  Yes [name], NP  zolpidem (AMBIEN) 10 MG tablet Take 1 tablet (10 mg total) by mouth at bedtime as needed for sleep. 03/30/18  Yes Biagio Borg, MD    Current Outpatient Medications  Medication Sig Dispense Refill   albuterol (PROVENTIL HFA;VENTOLIN HFA) 108 (90 Base) MCG/ACT inhaler Inhale 2 puffs into the lungs every 6 (six) hours as needed for wheezing or shortness of breath.     Azelastine-Fluticasone (DYMISTA) 137-50 MCG/ACT SUSP Use as directed 1 spray each side twice per day as needed 23 g 5   Beclomethasone Dipropionate (QNASL) 80 MCG/ACT AERS Place 2 sprays into the nose daily. 1 Inhaler 1   bictegravir-emtricitabine-tenofovir AF (BIKTARVY) 50-200-25 MG TABS tablet Take 1 tablet by mouth daily.     blood glucose meter kit and supplies Dispense based on patient and insurance preference. Use up to four times daily as directed. (FOR ICD-10 E10.9, E11.9). 1 each 0   carvedilol (COREG) 6.25 MG tablet Take 1 tablet (6.25 mg total) by mouth 2 (two) times daily. 60 tablet 11   Diclofenac Sodium (PENNSAID) 2 % SOLN Place 1 application onto the skin 2 (two) times daily.  (Patient taking differently: Place 1 application onto the skin 2 (two) times daily as needed. ) 1 Bottle 3   Dulaglutide (TRULICITY) 1.5 QB/3.4LP SOPN 0.5 ml weekly SQ 6 mL 3   empagliflozin (JARDIANCE) 25 MG TABS tablet Take 25 mg by mouth daily. 90 tablet 3   ferrous gluconate (IRON 27) 240 (27 FE) MG tablet Take 240 mg by mouth daily as needed (iron).      gabapentin (NEURONTIN) 300 MG capsule Take 1 capsule (300 mg total) by mouth at bedtime. 90 capsule 4   glipiZIDE (GLUCOTROL XL) 10 MG 24 hr tablet Take 1 tablet (10 mg total) by mouth daily with breakfast. 90 tablet 3   hydrocortisone (ANUSOL-HC)  25 MG suppository Place 1 suppository (25 mg total) rectally every 12 (twelve) hours. 12 suppository 1   ibuprofen (ADVIL,MOTRIN) 800 MG tablet Take 1 tablet (800 mg total) by mouth every 8 (eight) hours as needed. (Patient taking differently: Take 800 mg by mouth daily as needed for moderate pain. ) 60 tablet 0   Lancets (ONETOUCH DELICA PLUS [id number]) MISC USE TO TEST BLOOD SUGAR UP TO 4 TIMES DAILY 100 each 5   loratadine (CLARITIN) 10 MG tablet Take 10 mg by mouth daily.     LORazepam (ATIVAN) 0.5 MG tablet Take 1 tablet (0.5 mg total) by mouth at bedtime as needed (Nausea or vomiting). 30 tablet 0   losartan (COZAAR) 25 MG tablet Take 1 tablet (25 mg total) by mouth daily. 30 tablet 11   Multiple Vitamin (MULTIVITAMIN WITH MINERALS) TABS tablet Take 1 tablet by mouth daily.     omeprazole (PRILOSEC) 40 MG capsule Take 1 capsule (40 mg total) by mouth daily. 30 capsule 0   ONETOUCH VERIO test strip USE TO TEST BLOOD SUGAR UP TO 4 TIMES DAILY 100 each 1   oxyCODONE (OXY IR/ROXICODONE) 5 MG immediate release tablet Take 1 tablet (5 mg total) by mouth every 6 (six) hours as needed for severe pain. 15 tablet 0   pioglitazone (ACTOS) 45 MG tablet Take 1 tablet (45 mg total) by mouth daily. 90 tablet 3   valACYclovir (VALTREX) 500 MG tablet Take twice daily for 3-5 days 30 tablet 12     venlafaxine XR (EFFEXOR-XR) 37.5 MG 24 hr capsule Take 1 capsule (37.5 mg total) by mouth daily with breakfast. 90 capsule 4   zolpidem (AMBIEN) 10 MG tablet TAKE 1 TABLET BY MOUTH AT BEDTIME AS NEEDED FOR SLEEP 45 tablet 1   No current facility-administered medications for this visit.    Facility-Administered Medications Ordered in Other Visits  Medication Dose Route Frequency Provider Last Rate Last Dose   sodium chloride flush (NS) 0.9 % injection 10 mL  10 mL Intracatheter PRN [name], [name], MD   10 mL at 09/04/18 1425    Allergies as of 05/14/2019 - Review Complete 05/14/2019  Allergen Reaction Noted   Metformin and related Other (See Comments) 10/05/2018   Levaquin [levofloxacin in d5w] Nausea And Vomiting and Rash 08/17/2013   Penicillins Swelling and Rash 08/02/2007    Family History  Problem Relation Age of Onset   Prostate cancer Other    Heart disease Other    Stroke Other    Diabetes Mother    Hypertension Mother    Diabetes Father    Cancer Father        COLON and LU NG   Stroke Paternal Uncle    Breast cancer Paternal Aunt    Lung cancer Maternal Grandmother 45       Mesothelioma   Colon cancer Paternal Grandfather        dx over 43s   Lung cancer Paternal 18    Breast cancer Paternal Aunt    Breast cancer Paternal Aunt    Heart attack Maternal Grandfather 71   Colon cancer Other        MGM's 5 brothers    Social History   Socioeconomic History   Marital status: Divorced    Spouse name: Not on file   Number of children: Not on file   Years of education: Not on file   Highest education level: Not on file  Occupational History   Not on file  Social Needs   Emergency planning/management officer strain: Not on file   Food insecurity    Worry: Not on file    Inability: Not on file   Transportation needs    Medical: No    Non-medical: No  Tobacco Use   Smoking status: Never Smoker   Smokeless tobacco: Never Used  Substance  and Sexual Activity   Alcohol use: Yes    Alcohol/week: 0.0 standard drinks    Frequency: Never    Comment: social   Drug use: No   Sexual activity: Not Currently    Birth control/protection: Surgical    Comment: HAS HAD A HYSTERECTOMY  Lifestyle   Physical activity    Days per week: Not on file    Minutes per session: Not on file   Stress: Not on file  Relationships   Social connections    Talks on phone: Not on file    Gets together: Not on file    Attends religious service: Not on file    Active member of club or organization: Not on file    Attends meetings of clubs or organizations: Not on file    Relationship status: Not on file   Intimate partner violence    Fear of current or ex partner: No    Emotionally abused: No    Physically abused: No    Forced sexual activity: No  Other Topics Concern   Not on file  Social History Narrative   Not on file     Physical Exam:   General:   Alert, well-nourished, pleasant and cooperative in NAD Head:  Normocephalic and atraumatic. Eyes:  Sclera clear, no icterus.   Conjunctiva pink. Mouth:  No deformity or lesions.   Neck:  Supple; no thyromegaly. Lungs:  Clear throughout to auscultation.   No wheezes.  Heart:  Regular rate and rhythm; no murmurs Abdomen:  Soft, nontender, normal bowel sounds. No rebound or guarding. No hepatosplenomegaly Rectal:  Deferred  Msk:  Symmetrical without gross deformities. Extremities:  No gross deformities or edema. Neurologic:  Alert and  oriented x4;  grossly nonfocal Skin:  No rash or bruise. Psych:  Alert and cooperative. Normal mood and affect.    Genice Kimberlin L. Tarri Glenn Md, MPH [facility] 05/14/2019, 10:44 AM

## 2019-05-16 ENCOUNTER — Other Ambulatory Visit: Payer: Self-pay | Admitting: Internal Medicine

## 2019-06-11 ENCOUNTER — Encounter: Payer: Self-pay | Admitting: Gastroenterology

## 2019-06-11 IMAGING — DX DG CHEST 2V
2 series · 2 of 2 positions shown · non-contrast
Comparison: 02/23/2018

CLINICAL DATA: Check Port-A-Cath placement. History of breast
cancer.

EXAM:
CHEST - 2 VIEW

[chest pa]
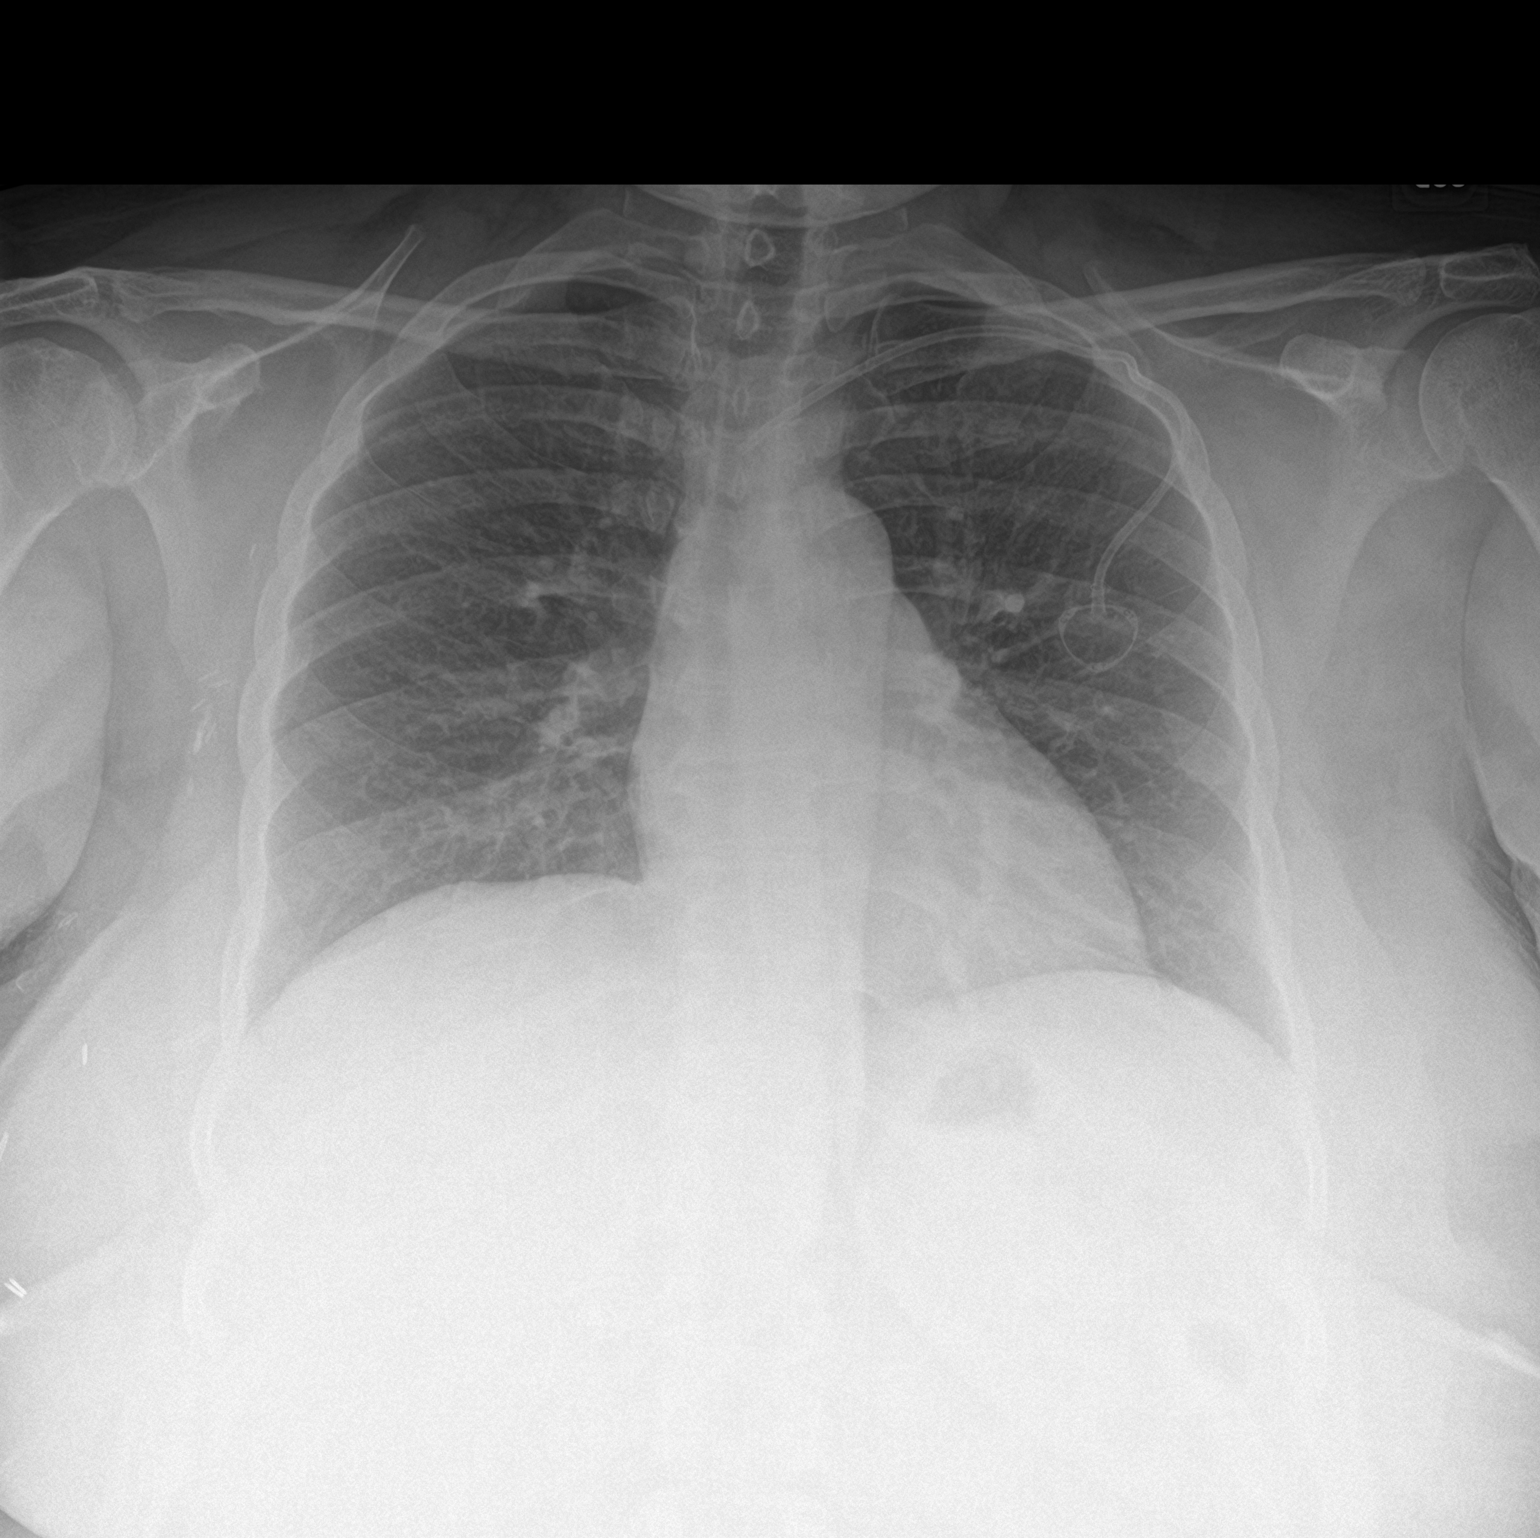

[chest lat]
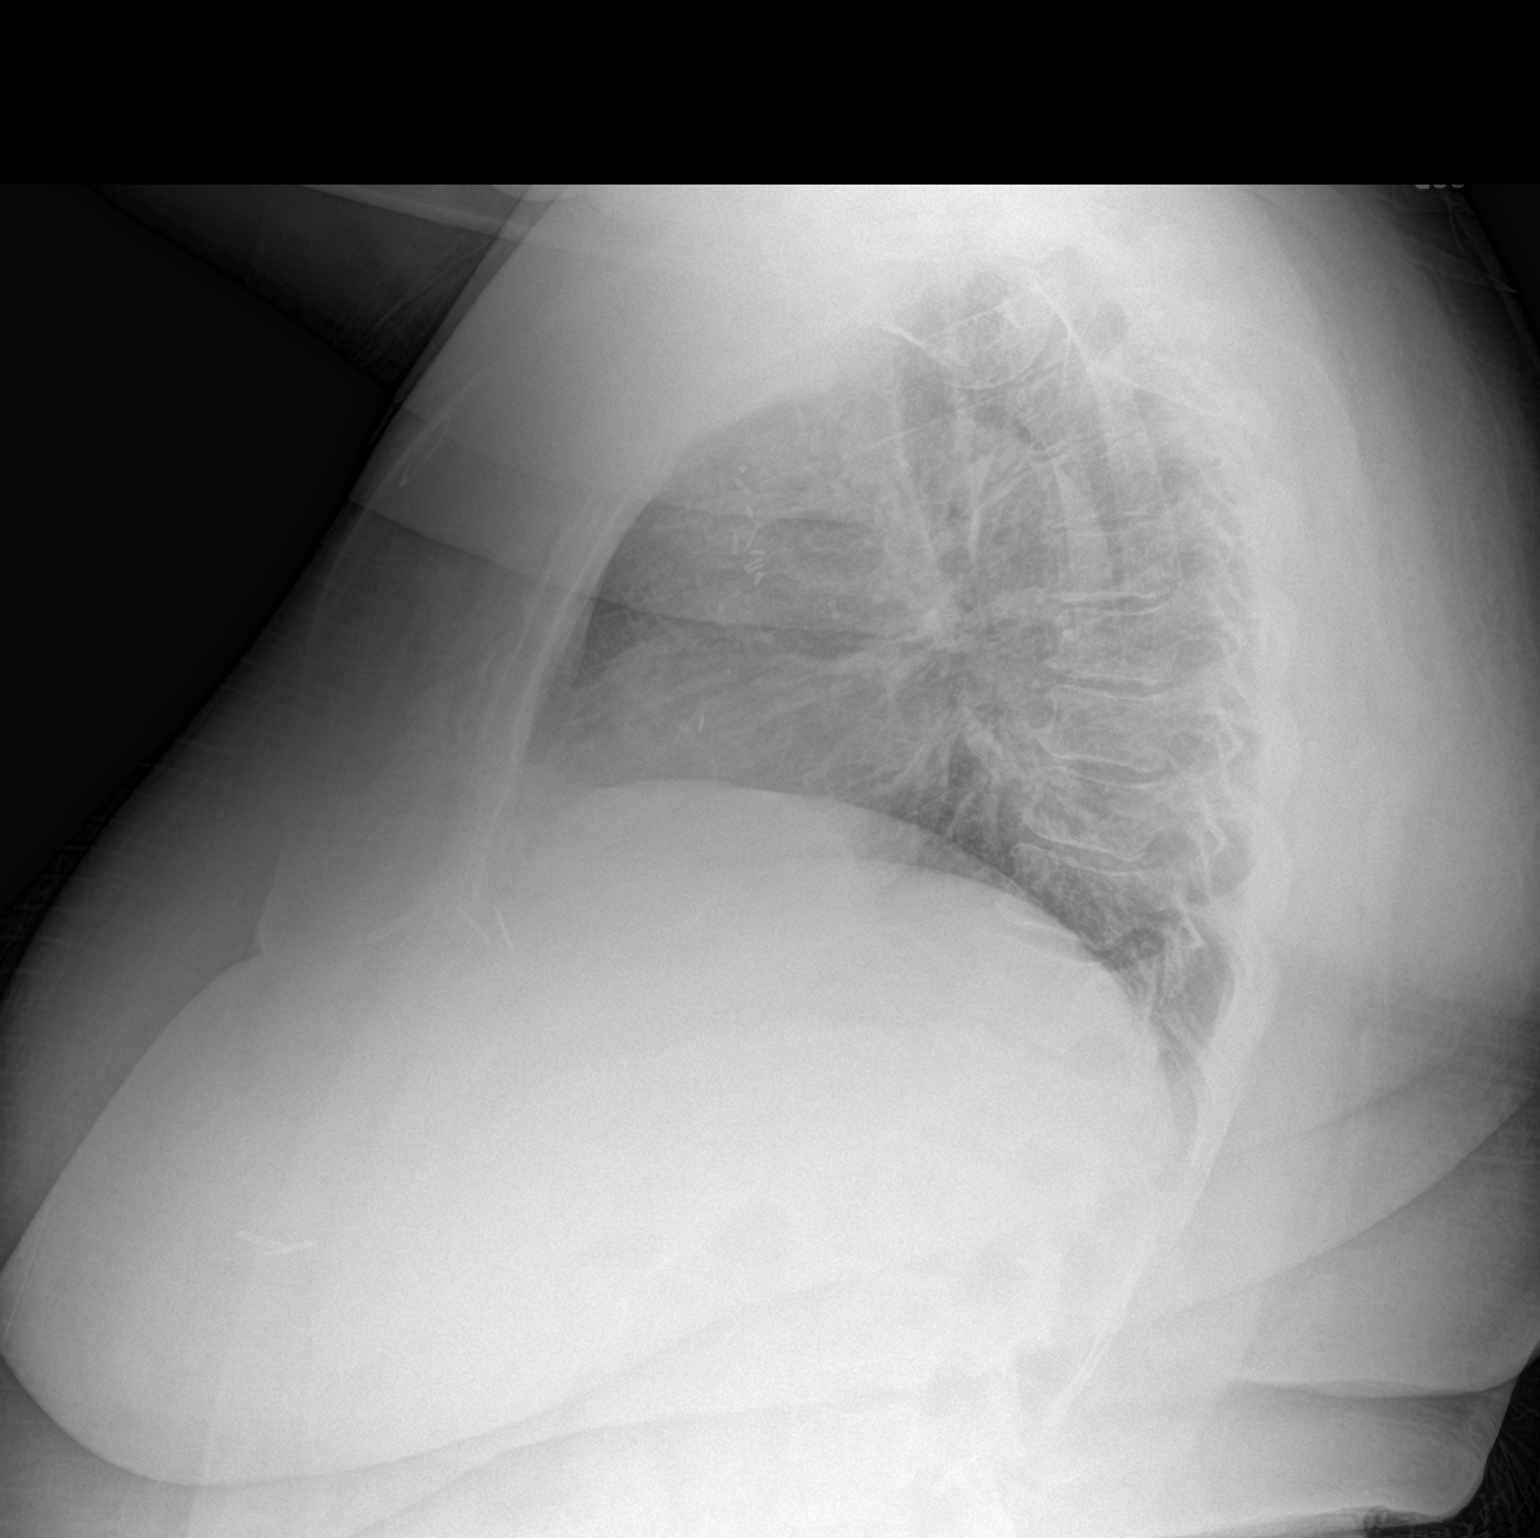

[2 of 2 positions shown; findings below may reference images not displayed]

FINDINGS: Left subclavian approach injectable port terminates at the expected
location of the junction of brachiocephalic vein and superior vena
cava. Mild kinking of the catheter is seen at the level of the
second rib laterally.

Cardiomediastinal silhouette is normal. Mediastinal contours appear
intact.

There is no evidence of focal airspace consolidation, pleural
effusion or pneumothorax.

Osseous structures are without acute abnormality. Postsurgical
changes in the right chest wall.
IMPRESSION: Left subclavian approach injectable port terminates at the expected
location of the junction of the brachiocephalic vein and superior
vena cava. Mild kinking of the catheter seen at the level of the
lateral second rib.

No pneumothorax.

## 2019-06-12 ENCOUNTER — Ambulatory Visit (INDEPENDENT_AMBULATORY_CARE_PROVIDER_SITE_OTHER): Payer: BC Managed Care – PPO

## 2019-06-12 ENCOUNTER — Other Ambulatory Visit: Payer: Self-pay | Admitting: Gastroenterology

## 2019-06-12 DIAGNOSIS — Z1159 Encounter for screening for other viral diseases: Secondary | ICD-10-CM

## 2019-06-12 LAB — SARS CORONAVIRUS 2 (TAT 6-24 HRS): SARS Coronavirus 2: NEGATIVE

## 2019-06-14 ENCOUNTER — Ambulatory Visit (AMBULATORY_SURGERY_CENTER): Payer: BC Managed Care – PPO | Admitting: Gastroenterology

## 2019-06-14 ENCOUNTER — Encounter: Payer: Self-pay | Admitting: Gastroenterology

## 2019-06-14 ENCOUNTER — Other Ambulatory Visit: Payer: Self-pay

## 2019-06-14 VITALS — BP 114/63 | HR 87 | Temp 97.9°F | Resp 21 | Ht 61.0 in | Wt 256.0 lb

## 2019-06-14 DIAGNOSIS — D123 Benign neoplasm of transverse colon: Secondary | ICD-10-CM

## 2019-06-14 DIAGNOSIS — K635 Polyp of colon: Secondary | ICD-10-CM

## 2019-06-14 DIAGNOSIS — K625 Hemorrhage of anus and rectum: Secondary | ICD-10-CM | POA: Diagnosis not present

## 2019-06-14 DIAGNOSIS — K648 Other hemorrhoids: Secondary | ICD-10-CM | POA: Diagnosis not present

## 2019-06-14 DIAGNOSIS — K6289 Other specified diseases of anus and rectum: Secondary | ICD-10-CM

## 2019-06-14 MED ORDER — SODIUM CHLORIDE 0.9 % IV SOLN: 500.0000 mL | INTRAVENOUS | Status: DC

## 2019-06-14 MED ORDER — HYDROCORTISONE (PERIANAL) 2.5 % EX CREA: 1.0000 "application " | TOPICAL_CREAM | Freq: Two times a day (BID) | CUTANEOUS | 1 refills | Status: AC

## 2019-06-14 NOTE — Patient Instructions (Signed)
Handout on polyps and high fiber.  Pick up new medication, Anusol, from pharmacy.  High fiber diet. Drink at least 64oz of water daily. Use a daily stool bulking agent such as metamucil.   YOU HAD AN ENDOSCOPIC PROCEDURE TODAY AT [facility]:   Refer to the procedure report that was given to you for any specific questions about what was found during the examination.  If the procedure report does not answer your questions, please call your gastroenterologist to clarify.  If you requested that your care partner not be given the details of your procedure findings, then the procedure report has been included in a sealed envelope for you to review at your convenience later.  YOU SHOULD EXPECT: Some feelings of bloating in the abdomen. Passage of more gas than usual.  Walking can help get rid of the air that was put into your GI tract during the procedure and reduce the bloating. If you had a lower endoscopy (such as a colonoscopy or flexible sigmoidoscopy) you may notice spotting of blood in your stool or on the toilet paper. If you underwent a bowel prep for your procedure, you may not have a normal bowel movement for a few days.  Please Note:  You might notice some irritation and congestion in your nose or some drainage.  This is from the oxygen used during your procedure.  There is no need for concern and it should clear up in a day or so.  SYMPTOMS TO REPORT IMMEDIATELY:   Following lower endoscopy (colonoscopy or flexible sigmoidoscopy):  Excessive amounts of blood in the stool  Significant tenderness or worsening of abdominal pains  Swelling of the abdomen that is new, acute  Fever of 100F or higher   For urgent or emergent issues, a gastroenterologist can be reached at any hour by calling [phone].   DIET:  We do recommend a small meal at first, but then you may proceed to your regular diet.  Drink plenty of fluids but you should avoid alcoholic beverages for 24  hours.  ACTIVITY:  You should plan to take it easy for the rest of today and you should NOT DRIVE or use heavy machinery until tomorrow (because of the sedation medicines used during the test).    FOLLOW UP: Our staff will call the number listed on your records 48-72 hours following your procedure to check on you and address any questions or concerns that you may have regarding the information given to you following your procedure. If we do not reach you, we will leave a message.  We will attempt to reach you two times.  During this call, we will ask if you have developed any symptoms of COVID 19. If you develop any symptoms (ie: fever, flu-like symptoms, shortness of breath, cough etc.) before then, please call [phone].  If you test positive for Covid 19 in the 2 weeks post procedure, please call and report this information to [place].    If any biopsies were taken you will be contacted by phone or by letter within the next 1-3 weeks.  Please call us at [phone] if you have not heard about the biopsies in 3 weeks.    SIGNATURES/CONFIDENTIALITY: You and/or your care partner have signed paperwork which will be entered into your electronic medical record.  These signatures attest to the fact that that the information above on your After Visit Summary has been reviewed and is understood.  Full responsibility of the confidentiality of  this discharge information lies with you and/or your care-partner.

## 2019-06-14 NOTE — Progress Notes (Signed)
Temp LC V/s 

## 2019-06-14 NOTE — Progress Notes (Signed)
Report given to PACU, vss 

## 2019-06-14 NOTE — Progress Notes (Signed)
Called to room to assist during endoscopic procedure.  Patient ID and intended procedure confirmed with present staff. Received instructions for my participation in the procedure from the performing physician.  

## 2019-06-14 NOTE — Op Note (Signed)
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                           Hematochezia/rectal pain, rectal bleeding the                            patient attributes to hemorrhoids                           Chemotherapy-induced thrombocytopenia, now resolved                           Family history of colon cancer (father at age 53)                           No prior colon cancer screening Medicines:                Monitored Anesthesia Care Procedure:                Pre-Anesthesia Assessment:                           - Prior to the procedure, a History and Physical                            was performed, and patient medications and                            allergies were reviewed. The patient's tolerance of                            previous anesthesia was also reviewed. The risks                            and benefits of the procedure and the sedation                            options and risks were discussed with the patient.                            All questions were answered, and informed consent                            was obtained. Prior Anticoagulants: The patient has                            taken no previous anticoagulant or antiplatelet  agents. ASA Grade Assessment: II - A patient with                            mild systemic disease. After reviewing the risks                            and benefits, the patient was deemed in                            satisfactory condition to undergo the procedure.                           After obtaining informed consent, the colonoscope                            was passed  under direct vision. Throughout the                            procedure, the patient's blood pressure, pulse, and                            oxygen saturations were monitored continuously. The                            Colonoscope was introduced through the anus and                            advanced to the the terminal ileum, with                            identification of the appendiceal orifice and IC                            valve. A second forward view of the right colon was                            performed. The colonoscopy was performed without                            difficulty. The patient tolerated the procedure                            well. The quality of the bowel preparation was                            good. The terminal ileum, ileocecal valve,                            appendiceal orifice, and rectum were photographed. Scope In: 1:25:39 PM Scope Out: 1:40:27 PM Scope Withdrawal Time: 0 hours 12 minutes 6 seconds  Total Procedure Duration: 0 hours 14 minutes 48 seconds  Findings:                 Hemorrhoids were found on perianal  exam.                           Non-bleeding internal hemorrhoids were found.                           A 1 mm polyp was found in the proximal transverse                            colon. The polyp was sessile. The polyp was removed                            with a cold biopsy forceps. Resection and retrieval                            were complete. Estimated blood loss was minimal.                           The exam was otherwise without abnormality on                            direct and retroflexion views. Complications:            No immediate complications. Estimated Blood Loss:     Estimated blood loss: none. Estimated blood loss                            was minimal. Impression:               - Hemorrhoids found on perianal exam.                           - Non-bleeding internal hemorrhoids.                           -  The examination was otherwise normal on direct                            and retroflexion views.                           - No specimens collected. Recommendation:           - Patient has a contact number available for                            emergencies. The signs and symptoms of potential                            delayed complications were discussed with the                            patient. Return to normal activities tomorrow.                            Written discharge instructions were provided to the  patient.                           - High fiber diet. Drink at least 64 ounces of                            water daily. Use a daily stool bulking agent such                            as Metamucil.                           - Anusol HC 2.5% applied sparingly to your rectum                            twice daily.                           - Continue present medications.                           - Call to schedule hemorrhoid banding if the                            bleeding does not respond to these conservative                            measures.                           - Repeat colonoscopy in 5 years for screening                            purposes given the family history of colon cancer. Thornton Park MD, MD 06/14/2019 1:50:52 PM This report has been signed electronically.

## 2019-06-15 ENCOUNTER — Telehealth: Payer: Self-pay

## 2019-06-15 NOTE — Telephone Encounter (Signed)
This nurse called the breast center to inquire about scheduling patient appointment for Diag MM.  Spoke with Anderson Malta, she confirmed that they had tried reaching out to her multiple times without success.   Appointment was scheduled today by the this nurse.  Attempted to contact patient without answer.  A vm was left for patient confirming date and time for appointment with the breast center as well as their phone number.  Nurse also left message for patient to call back to center with any questions.

## 2019-06-18 ENCOUNTER — Telehealth: Payer: Self-pay | Admitting: *Deleted

## 2019-06-18 ENCOUNTER — Telehealth: Payer: Self-pay

## 2019-06-18 NOTE — Telephone Encounter (Signed)
No answer, unable to leave message °

## 2019-06-18 NOTE — Telephone Encounter (Signed)
No answer for post procedure call back, unable to leave message. SM

## 2019-06-19 ENCOUNTER — Other Ambulatory Visit: Payer: Self-pay

## 2019-06-19 ENCOUNTER — Telehealth: Payer: Self-pay | Admitting: Adult Health

## 2019-06-19 ENCOUNTER — Telehealth (HOSPITAL_COMMUNITY): Payer: Self-pay

## 2019-06-19 ENCOUNTER — Inpatient Hospital Stay: Payer: BC Managed Care – PPO | Attending: Adult Health | Admitting: Adult Health

## 2019-06-19 DIAGNOSIS — C50411 Malignant neoplasm of upper-outer quadrant of right female breast: Secondary | ICD-10-CM | POA: Diagnosis not present

## 2019-06-19 DIAGNOSIS — Z170 Estrogen receptor positive status [ER+]: Secondary | ICD-10-CM | POA: Diagnosis not present

## 2019-06-19 NOTE — Telephone Encounter (Signed)
I talk with patient regarding video visit phone number is correct

## 2019-06-19 NOTE — Telephone Encounter (Signed)
Received a fax from [facility] for Medical records. Faxed Records to [phone].

## 2019-06-19 NOTE — Progress Notes (Signed)
SURVIVORSHIP VIRTUAL VISIT:  I connected with Kelly Rowe on 06/19/19 at  2:00 PM EST by telephone and verified that I am speaking with the correct person using two identifiers.  I discussed the limitations, risks, security and privacy concerns of performing an evaluation and management service by telephone and the availability of in person appointments. I also discussed with the patient that there may be a patient responsible charge related to this service. The patient expressed understanding and agreed to proceed.   BRIEF ONCOLOGIC HISTORY:  Oncology History  Malignant neoplasm of upper-outer quadrant of right breast in female, estrogen receptor positive ([place])  01/11/2018 Initial Diagnosis   48 y.o. Kelly Rowe, [name] woman status post right breast upper outer quadrant biopsy 01/11/2018 for a clinical T1 N0, stage IA invasive ductal carcinoma, grade 3, estrogen and progesterone receptor positive, HER-2 amplified, with an MIB-1 of 30%.   01/18/2018 Cancer Staging   Staging form: Breast, AJCC 8th Edition - Clinical stage from 01/18/2018: Stage IIIB (cT1c, cN3b, cM0, G3, ER+, PR+, HER2+) - Signed by [name], MD on 07/18/2018   01/26/2018 Genetic Testing   Negative genetic testing on the multicancer panel.  The Multi-Gene Panel offered by [facility] includes sequencing and/or deletion duplication testing of the following 83 genes: ALK, APC, ATM, AXIN2,BAP1,  BARD1, BLM, BMPR1A, BRCA1, BRCA2, BRIP1, CASR, CDC73, CDH1, CDK4, CDKN1B, CDKN1C, CDKN2A (p14ARF), CDKN2A (p16INK4a), CEBPA, CHEK2, CTNNA1, DICER1, DIS3L2, EGFR (c.2369C>T, p.Thr790Met variant only), EPCAM (Deletion/duplication testing only), FH, FLCN, GATA2, GPC3, GREM1 (Promoter region deletion/duplication testing only), HOXB13 (c.251G>A, p.Gly84Glu), HRAS, KIT, MAX, MEN1, MET, MITF (c.952G>A, p.Glu318Lys variant only), MLH1, MSH2, MSH3, MSH6, MUTYH, NBN, NF1, NF2, NTHL1, PALB2, PDGFRA, PHOX2B, PMS2, POLD1, POLE, POT1, PRKAR1A, PTCH1, PTEN, RAD50,  RAD51C, RAD51D, RB1, RECQL4, RET, RUNX1, SDHAF2, SDHA (sequence changes only), SDHB, SDHC, SDHD, SMAD4, SMARCA4, SMARCB1, SMARCE1, STK11, SUFU, TERT, TERT, TMEM127, TP53, TSC1, TSC2, VHL, WRN and WT1.   The report date is January 26, 2018.   02/23/2018 Surgery   status post right lumpectomy and sentinel lymph node sampling for a pT2 pN1, stage IB invasive ductal carcinoma, grade 3, with close but negative margins   03/08/2018 Cancer Staging   Staging form: Breast, AJCC 8th Edition - Pathologic: Stage IB (pT2, pN1a, cM0, G3, ER+, PR+, HER2+) - Signed by Gardenia Phlegm, NP on 03/08/2018   03/08/2018 - 07/05/2018 Adjuvant Chemotherapy   adjuvant chemotherapy consisting of carboplatin, docetaxel, trastuzumab and Pertuzumab every 21 days x 6 given between 03/08/2018-07/05/2018 (a) pertuzumab stopped after cycle 1 due to diarrhea (b) docetaxel changed to gemcitabine after cycle 2 due to persistent hyperglycemia (c) Gemcitabine and Carboplatin dose reduced by approximately 10% due to delayed neutropenia and thrombocytopenia   anti-HER-2 immunotherapy started concurrently with chemotherapy (a) baseline echocardiogram on 02/07/2018 showed an ejection fraction in the 55-60% range (b) repeat echo 06/19/2018 showed an ejection fraction of 55-60% (c) repeat echocardiogram 11/14/2018 showed an EF in the 50-55% range, with normal strain. (d) echo 01/31/2019 shows an ejection fraction in the 55/60% range (e) final trastuzumab dose 03/12/2019   07/27/2018 - 09/06/2018 Radiation Therapy   adjuvant radiation              Site/dose:        1. Right Breast / 50 Gy in 25 fractions                                     2.  Right Supraclavicular nodes / 50 Gy in 25 fractions                                     3. Right Breast Boost / 10 Gy in 5 fractions   09/2018 -  Anti-estrogen oral therapy   Tamoxifen daily     INTERVAL HISTORY:  Kelly Rowe to review her survivorship care plan detailing her treatment course  for breast cancer, as well as monitoring long-term side effects of that treatment, education regarding health maintenance, screening, and overall wellness and health promotion.     Overall, Kelly Rowe reports feeling quite well.  She is taking Tamoxifen daily and is tolerating it well. She has had some hot flashes that are manageable.  She has no vaginal discharge.  She denies general arthralgias, but has some breast pain.  She has been looking into a breast reduction because of symmetry differences since her treatment.  She is taking Venlafaxine for her hot flashes.  She does note that she doesn't take it regularly and she has some intermittent headaches.    She has a lymphedema sleeve she wears on the right arm, and the swelling is stable. She does the exercises as recommended.  Kelly Rowe has more frequent headaches over the past two months.    REVIEW OF SYSTEMS:  Review of Systems  Constitutional: Negative for appetite change, chills, fatigue, fever and unexpected weight change.  HENT:   Negative for hearing loss, lump/mass, sore throat and trouble swallowing.   Eyes: Negative for eye problems and icterus.  Respiratory: Negative for chest tightness, cough and shortness of breath.   Cardiovascular: Negative for chest pain, leg swelling and palpitations.  Gastrointestinal: Negative for abdominal distention, abdominal pain, constipation, diarrhea, nausea and vomiting.  Endocrine: Positive for hot flashes.  Genitourinary: Negative for difficulty urinating.   Musculoskeletal: Negative for arthralgias and gait problem.  Skin: Negative for itching and rash.  Neurological: Positive for headaches. Negative for dizziness, extremity weakness, gait problem, light-headedness, numbness, seizures and speech difficulty.  Hematological: Negative for adenopathy. Does not bruise/bleed easily.  Psychiatric/Behavioral: Negative for depression. The patient is not nervous/anxious.   Breast: Denies any new  nodularity, masses, tenderness, nipple changes, or nipple discharge.      ONCOLOGY TREATMENT TEAM:  1. Surgeon:  Dr. Barry Dienes at [facility] 2. Medical Oncologist: Dr. Jana Hakim  3. Radiation Oncologist: Dr. Isidore Moos    PAST MEDICAL/SURGICAL HISTORY:  Past Medical History:  Diagnosis Date  . ANEMIA-IRON DEFICIENCY 01/30/2010  . ANXIETY 11/27/2007  . Arthritis   . Asthma 02/25/2011  . Cancer ([place])    breast  . Carbuncle and furuncle of trunk 04/16/2010  . Chlamydia infection 03/21/2008  . DIABETES MELLITUS, TYPE II 08/02/2007  . Edema 01/30/2010  . ELEVATED BLOOD PRESSURE WITHOUT DIAGNOSIS OF HYPERTENSION 08/02/2007  . Family history of breast cancer   . Family history of colon cancer   . Family history of lung cancer   . FREQUENCY, URINARY 05/19/2009  . GENITAL HERPES 03/12/2009  . GERD (gastroesophageal reflux disease)   . History of kidney stones   . History of radiation therapy 07/27/18- 09/06/18   Right Breast, Right Axillary and Supraclavicular nodes 50 Gy in 25 fractions, Right Breast Boost 10 Gy in 5 fractions  . HIV (human immunodeficiency virus infection) ([place]) 2009  . HIV INFECTION 03/12/2009  . HSV (herpes simplex virus) infection   . HYPERLIPIDEMIA  08/02/2007  . Hypertension   . Metrorrhagia 03/21/2008  . PONV (postoperative nausea and vomiting)    woke up crying per patient   . RETENTION, URINE 05/19/2009  . Sleep apnea   . Trichomonas infection 01/19/2010  . VITAMIN D DEFICIENCY 01/30/2010   Past Surgical History:  Procedure Laterality Date  . ABDOMINAL HYSTERECTOMY  07/22/2010   TAH WITH PRESERVATION OF BOTH TUBES AND OVARIES  . BREAST LUMPECTOMY WITH RADIOACTIVE SEED AND SENTINEL LYMPH NODE BIOPSY Right 02/23/2018   Procedure: BREAST LUMPECTOMY WITH RADIOACTIVE SEED AND SENTINEL LYMPH NODE BIOPSY;  Surgeon: Stark Klein, MD;  Location: [facility];  Service: General;  Laterality: Right;  . CHOLECYSTECTOMY    . ENDOMETRIAL ABLATION  01/11/2008   HER OPTION  .  ESOPHAGOGASTRODUODENOSCOPY    . MULTIPLE TOOTH EXTRACTIONS    . PORT-A-CATH REMOVAL Left 05/08/2019   Procedure: REMOVAL PORT-A-CATH;  Surgeon: Stark Klein, MD;  Location: [facility];  Service: General;  Laterality: Left;  . PORTACATH PLACEMENT Left 02/23/2018   Procedure: INSERTION PORT-A-CATH;  Surgeon: Stark Klein, MD;  Location: [facility];  Service: General;  Laterality: Left;  . PORTACATH PLACEMENT N/A 04/21/2018   Procedure: PORT-A-CATH REVISION;  Surgeon: Stark Klein, MD;  Location: [facility];  Service: General;  Laterality: N/A;  . TUBAL LIGATION    . WISDOM TOOTH EXTRACTION       ALLERGIES:  Allergies  Allergen Reactions  . Metformin And Related Other (See Comments)    Bowel frequency  . Levaquin [Levofloxacin In D5w] Nausea And Vomiting and Rash  . Penicillins Swelling and Rash    Has patient had a PCN reaction causing immediate rash, facial/tongue/throat swelling, SOB or lightheadedness with hypotension: Yes Has patient had a PCN reaction causing severe rash involving mucus membranes or skin necrosis: No Has patient had a PCN reaction that required hospitalization: Yes Has patient had a PCN reaction occurring within the last 10 years: No If all of the above answers are "NO", then may proceed with Cephalosporin use.     CURRENT MEDICATIONS:  Outpatient Encounter Medications as of 06/19/2019  Medication Sig  . albuterol (PROVENTIL HFA;VENTOLIN HFA) 108 (90 Base) MCG/ACT inhaler Inhale 2 puffs into the lungs every 6 (six) hours as needed for wheezing or shortness of breath.  . Azelastine-Fluticasone (DYMISTA) 137-50 MCG/ACT SUSP Use as directed 1 spray each side twice per day as needed (Patient not taking: Reported on 06/14/2019)  . Beclomethasone Dipropionate (QNASL) 80 MCG/ACT AERS Place 2 sprays into the nose daily. (Patient not taking: Reported on 06/14/2019)  . bictegravir-emtricitabine-tenofovir AF (BIKTARVY) 50-200-25 MG TABS tablet Take 1 tablet by mouth  daily.  . blood glucose meter kit and supplies Dispense based on patient and insurance preference. Use up to four times daily as directed. (FOR ICD-10 E10.9, E11.9).  . carvedilol (COREG) 6.25 MG tablet Take 1 tablet (6.25 mg total) by mouth 2 (two) times daily.  . Diclofenac Sodium (PENNSAID) 2 % SOLN Place 1 application onto the skin 2 (two) times daily. (Patient not taking: Reported on 06/14/2019)  . Dulaglutide (TRULICITY) 1.5 SM/2.7MB SOPN 0.5 ml weekly SQ  . empagliflozin (JARDIANCE) 25 MG TABS tablet Take 25 mg by mouth daily. (Patient not taking: Reported on 06/14/2019)  . ferrous gluconate (IRON 27) 240 (27 FE) MG tablet Take 240 mg by mouth daily as needed (iron).   . gabapentin (NEURONTIN) 300 MG capsule Take 1 capsule (300 mg total) by mouth at bedtime. (Patient not taking: Reported on 06/14/2019)  .  glipiZIDE (GLUCOTROL XL) 10 MG 24 hr tablet Take 1 tablet (10 mg total) by mouth daily with breakfast.  . hydrocortisone (ANUSOL-HC) 2.5 % rectal cream Place 1 application rectally 2 (two) times daily.  . hydrocortisone (ANUSOL-HC) 25 MG suppository Place 1 suppository (25 mg total) rectally every 12 (twelve) hours. (Patient not taking: Reported on 06/14/2019)  . ibuprofen (ADVIL,MOTRIN) 800 MG tablet Take 1 tablet (800 mg total) by mouth every 8 (eight) hours as needed. (Patient not taking: Reported on 06/14/2019)  . Lancets (ONETOUCH DELICA PLUS [id number]) MISC USE TO TEST BLOOD SUGAR UP TO 4 TIMES DAILY  . loratadine (CLARITIN) 10 MG tablet Take 10 mg by mouth daily.  Marland Kitchen LORazepam (ATIVAN) 0.5 MG tablet Take 1 tablet (0.5 mg total) by mouth at bedtime as needed (Nausea or vomiting). (Patient not taking: Reported on 06/14/2019)  . losartan (COZAAR) 25 MG tablet Take 1 tablet (25 mg total) by mouth daily.  . Multiple Vitamin (MULTIVITAMIN WITH MINERALS) TABS tablet Take 1 tablet by mouth daily.  Marland Kitchen omeprazole (PRILOSEC) 40 MG capsule Take 1 capsule (40 mg total) by mouth daily. (Patient not  taking: Reported on 06/14/2019)  . ONETOUCH VERIO test strip USE 1 STRIP TO CHECK GLUCOSE 4 TIMES DAILY AS DIRECTED  . oxyCODONE (OXY IR/ROXICODONE) 5 MG immediate release tablet Take 1 tablet (5 mg total) by mouth every 6 (six) hours as needed for severe pain.  . pioglitazone (ACTOS) 45 MG tablet Take 1 tablet (45 mg total) by mouth daily.  . tamoxifen (NOLVADEX) 20 MG tablet Take 20 mg by mouth daily.  . valACYclovir (VALTREX) 500 MG tablet Take twice daily for 3-5 days (Patient not taking: Reported on 06/14/2019)  . venlafaxine XR (EFFEXOR-XR) 37.5 MG 24 hr capsule Take 1 capsule (37.5 mg total) by mouth daily with breakfast. (Patient not taking: Reported on 06/14/2019)  . zolpidem (AMBIEN) 10 MG tablet TAKE 1 TABLET BY MOUTH AT BEDTIME AS NEEDED FOR SLEEP   Facility-Administered Encounter Medications as of 06/19/2019  Medication  . sodium chloride flush (NS) 0.9 % injection 10 mL     ONCOLOGIC FAMILY HISTORY:  Family History  Problem Relation Age of Onset  . Prostate cancer Other   . Heart disease Other   . Stroke Other   . Diabetes Mother   . Hypertension Mother   . Diabetes Father   . Cancer Father        COLON and LU NG  . Colon cancer Father   . Stroke Paternal Uncle   . Breast cancer Paternal Aunt   . Lung cancer Maternal Grandmother 30       Mesothelioma  . Colon cancer Paternal Grandfather        dx over 72s  . Lung cancer Paternal Aunt   . Breast cancer Paternal Aunt   . Breast cancer Paternal Aunt   . Heart attack Maternal Grandfather 71  . Colon cancer Other        MGM's 5 brothers     GENETIC COUNSELING/TESTING: negative  SOCIAL HISTORY:  Social History   Socioeconomic History  . Marital status: Divorced    Spouse name: Not on file  . Number of children: Not on file  . Years of education: Not on file  . Highest education level: Not on file  Occupational History  . Not on file  Social Needs  . Financial resource strain: Not on file  . Food  insecurity    Worry: Not on file  Inability: Not on file  . Transportation needs    Medical: No    Non-medical: No  Tobacco Use  . Smoking status: Never Smoker  . Smokeless tobacco: Never Used  Substance and Sexual Activity  . Alcohol use: Yes    Alcohol/week: 0.0 standard drinks    Frequency: Never    Comment: social  . Drug use: No  . Sexual activity: Not Currently    Birth control/protection: Surgical    Comment: HAS HAD A HYSTERECTOMY  Lifestyle  . Physical activity    Days per week: Not on file    Minutes per session: Not on file  . Stress: Not on file  Relationships  . Social Herbalist on phone: Not on file    Gets together: Not on file    Attends religious service: Not on file    Active member of club or organization: Not on file    Attends meetings of clubs or organizations: Not on file    Relationship status: Not on file  . Intimate partner violence    Fear of current or ex partner: No    Emotionally abused: No    Physically abused: No    Forced sexual activity: No  Other Topics Concern  . Not on file  Social History Narrative  . Not on file     OBSERVATIONS/OBJECTIVE:  Patient sounds well.  In no apparent distress.  Breathing is non labored.  Mood and behavior are normal.  Speech is normal.    LABORATORY DATA:  None for this visit.  DIAGNOSTIC IMAGING:  None for this visit.      ASSESSMENT AND PLAN:  Ms.. Rowe is a pleasant 48 y.o. female with Stage IB right breast invasive ductal carcinoma, ER+/PR+/HER2+, diagnosed in 12/2017, treated with lumpectomy, adjuvant chemotherapy, adjuvant radiation therapy, and anti-estrogen therapy with Tamoxifen beginning in 09/2018.  She presents to the Survivorship Clinic for our initial meeting and routine follow-up post-completion of treatment for breast cancer.    1. Stage IB right breast cancer:  Kelly Rowe is continuing to recover from definitive treatment for breast cancer. She will follow-up  with her medical oncologist, Dr. Jana Hakim in 09/2019 with history and physical exam per surveillance protocol.  She will continue her anti-estrogen therapy with Tamoxifen. Thus far, she is tolerating the Tamoxifen well, with minimal side effects.  Her mammogram is overdue, and we set this up for her last week.  She will have this next week.  Today, a comprehensive survivorship care plan and treatment summary was reviewed with the patient today detailing her breast cancer diagnosis, treatment course, potential late/long-term effects of treatment, appropriate follow-up care with recommendations for the future, and patient education resources.  A copy of this summary, along with a letter will be sent to the patient's primary care provider via mail/fax/In Basket message after today's visit.    2. Headaches: I think that this is largely related to her intermittently taking Venlafaxine.  I recommended that she take the venlafaxine daily, and let it get in her system, and after 1-2 weeks of taking it, the adverse effects will improve.  She will also have more consistent control of her hot flashes.  She was also recommended to drink plenty of water, and limit caffeine intake.  It the headaches persist, she will keep a headache diary, noting location, level of pain, time of day, duration, and alleviating/aggravating factors.  She will call us with that information.    3. Bone  health: Kelly Rowe was given education on specific activities to promote bone health.  4. Cancer screening:  Due to Kelly Rowe's history and her age, she should receive screening for skin cancers, colon cancer, and gynecologic cancers.  The information and recommendations are listed on the patient's comprehensive care plan/treatment summary and were reviewed in detail with the patient.    5. Health maintenance and wellness promotion: Kelly Rowe was encouraged to consume 5-7 servings of fruits and vegetables per day. We reviewed the "Nutrition  Rain[employer]" handout, as well as the handout "Take Control of Your Health and Reduce Your Cancer Risk" from the [employer].  She was also encouraged to engage in moderate to vigorous exercise for 30 minutes per day most days of the week. We discussed the [employer] fitness program, which is designed for cancer survivors to help them become more physically fit after cancer treatments.  She was instructed to limit her alcohol consumption and continue to abstain from tobacco use.     6. Support services/counseling: It is not uncommon for this period of the patient's cancer care trajectory to be one of many emotions and stressors.  We discussed how this can be increasingly difficult during the times of quarantine and social distancing due to the COVID-19 pandemic.   She was given information regarding our available services and encouraged to contact me with any questions or for help enrolling in any of our support group/programs.    Follow up instructions:    -Return to cancer center 09/2019   -Mammogram due in 02/2019--delayed until 06/2019 by patient -Follow up with surgery 03/2020 -She is welcome to return back to the Survivorship Clinic at any time; no additional follow-up needed at this time.  -Consider referral back to survivorship as a long-term survivor for continued surveillance  The patient was provided an opportunity to ask questions and all were answered. The patient agreed with the plan and demonstrated an understanding of the instructions.   The patient was advised to call back or seek an in-person evaluation if the symptoms worsen or if the condition fails to improve as anticipated.   I provided 24 minutes of non face-to-face telephone visit time during this encounter, and > 50% was spent counseling as documented under my assessment & plan.  Scot Dock, NP

## 2019-06-20 ENCOUNTER — Encounter: Payer: Self-pay | Admitting: Women's Health

## 2019-06-20 ENCOUNTER — Ambulatory Visit: Payer: BC Managed Care – PPO | Admitting: Women's Health

## 2019-06-20 VITALS — BP 126/78 | Ht 61.0 in | Wt 260.0 lb

## 2019-06-20 DIAGNOSIS — Z01419 Encounter for gynecological examination (general) (routine) without abnormal findings: Secondary | ICD-10-CM | POA: Diagnosis not present

## 2019-06-20 NOTE — Patient Instructions (Signed)
Good to see you, thank you for teaching also!  Vit D 2000 iu daily  Health Maintenance for Postmenopausal Women Menopause is a normal process in which your ability to get pregnant comes to an end. This process happens slowly over many months or years, usually between the ages of 68 and 74. Menopause is complete when you have missed your menstrual periods for 12 months. It is important to talk with your health care provider about some of the most common conditions that affect women after menopause (postmenopausal women). These include heart disease, cancer, and bone loss (osteoporosis). Adopting a healthy lifestyle and getting preventive care can help to promote your health and wellness. The actions you take can also lower your chances of developing some of these common conditions. What should I know about menopause? During menopause, you may get a number of symptoms, such as:  Hot flashes. These can be moderate or severe.  Night sweats.  Decrease in sex drive.  Mood swings.  Headaches.  Tiredness.  Irritability.  Memory problems.  Insomnia. Choosing to treat or not to treat these symptoms is a decision that you make with your health care provider. Do I need hormone replacement therapy?  Hormone replacement therapy is effective in treating symptoms that are caused by menopause, such as hot flashes and night sweats.  Hormone replacement carries certain risks, especially as you become older. If you are thinking about using estrogen or estrogen with progestin, discuss the benefits and risks with your health care provider. What is my risk for heart disease and stroke? The risk of heart disease, heart attack, and stroke increases as you age. One of the causes may be a change in the body's hormones during menopause. This can affect how your body uses dietary fats, triglycerides, and cholesterol. Heart attack and stroke are medical emergencies. There are many things that you can do to help  prevent heart disease and stroke. Watch your blood pressure  High blood pressure causes heart disease and increases the risk of stroke. This is more likely to develop in people who have high blood pressure readings, are of African descent, or are overweight.  Have your blood pressure checked: ? Every 3-5 years if you are 38-28 years of age. ? Every year if you are 91 years old or older. Eat a healthy diet   Eat a diet that includes plenty of vegetables, fruits, low-fat dairy products, and lean protein.  Do not eat a lot of foods that are high in solid fats, added sugars, or sodium. Get regular exercise Get regular exercise. This is one of the most important things you can do for your health. Most adults should:  Try to exercise for at least 150 minutes each week. The exercise should increase your heart rate and make you sweat (moderate-intensity exercise).  Try to do strengthening exercises at least twice each week. Do these in addition to the moderate-intensity exercise.  Spend less time sitting. Even light physical activity can be beneficial. Other tips  Work with your health care provider to achieve or maintain a healthy weight.  Do not use any products that contain nicotine or tobacco, such as cigarettes, e-cigarettes, and chewing tobacco. If you need help quitting, ask your health care provider.  Know your numbers. Ask your health care provider to check your cholesterol and your blood sugar (glucose). Continue to have your blood tested as directed by your health care provider. Do I need screening for cancer? Depending on your health history and  family history, you may need to have cancer screening at different stages of your life. This may include screening for:  Breast cancer.  Cervical cancer.  Lung cancer.  Colorectal cancer. What is my risk for osteoporosis? After menopause, you may be at increased risk for osteoporosis. Osteoporosis is a condition in which bone  destruction happens more quickly than new bone creation. To help prevent osteoporosis or the bone fractures that can happen because of osteoporosis, you may take the following actions:  If you are 41-59 years old, get at least 1,000 mg of calcium and at least 600 mg of vitamin D per day.  If you are older than age 43 but younger than age 75, get at least 1,200 mg of calcium and at least 600 mg of vitamin D per day.  If you are older than age 37, get at least 1,200 mg of calcium and at least 800 mg of vitamin D per day. Smoking and drinking excessive alcohol increase the risk of osteoporosis. Eat foods that are rich in calcium and vitamin D, and do weight-bearing exercises several times each week as directed by your health care provider. How does menopause affect my mental health? Depression may occur at any age, but it is more common as you become older. Common symptoms of depression include:  Low or sad mood.  Changes in sleep patterns.  Changes in appetite or eating patterns.  Feeling an overall lack of motivation or enjoyment of activities that you previously enjoyed.  Frequent crying spells. Talk with your health care provider if you think that you are experiencing depression. General instructions See your health care provider for regular wellness exams and vaccines. This may include:  Scheduling regular health, dental, and eye exams.  Getting and maintaining your vaccines. These include: ? Influenza vaccine. Get this vaccine each year before the flu season begins. ? Pneumonia vaccine. ? Shingles vaccine. ? Tetanus, diphtheria, and pertussis (Tdap) booster vaccine. Your health care provider may also recommend other immunizations. Tell your health care provider if you have ever been abused or do not feel safe at home. Summary  Menopause is a normal process in which your ability to get pregnant comes to an end.  This condition causes hot flashes, night sweats, decreased  interest in sex, mood swings, headaches, or lack of sleep.  Treatment for this condition may include hormone replacement therapy.  Take actions to keep yourself healthy, including exercising regularly, eating a healthy diet, watching your weight, and checking your blood pressure and blood sugar levels.  Get screened for cancer and depression. Make sure that you are up to date with all your vaccines. This information is not intended to replace advice given to you by your health care provider. Make sure you discuss any questions you have with your health care provider. Document Released: 09/03/2005 Document Revised: 07/05/2018 Document Reviewed: 07/05/2018 Elsevier Patient Education  2020 [employer].

## 2019-06-20 NOTE — Progress Notes (Signed)
Kelly Rowe 12-Nov-1970 [id number]  History:  [name] presents for annual exam with no complaints. 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 2011 TAH for fibroids. 2011 diagnosed HIV, doing well, undetectable viral load managed at [facility]. Normal Pap history. History of HSV, obesity, HTN, hypercholesterolemia, uncontrolled type II diabetes managed by primary care. Currently not sexually active, denies need for STD screen. Having hot flashes post chemo. Reports recent feelings of depression, issues with body image, difficulty going to sleep. 05/2019 negative colonoscopy with benign polyp, 5 year follow-up. Mammogram scheduled 06/2019. 04/2019 labs done by primary care, high glucose levels.  Past medical history, past surgical history, family history and social history were all reviewed and documented in the EPIC chart. 5 children, recently moved oldest son out of home due to mental health problems, schizophrenia, reports doing well. 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 Lives with children and grandson, who is graduating from [employer] this year. Father- colon cancer, mother- diabetes.   ROS:  A ROS was performed and pertinent positives and negatives are included.  Exam:  Vitals:   06/20/19 1450  BP: 126/78  Weight: 260 lb (117.9 kg)  Height: 5\' 1"  (1.549 m)   Body mass index is 49.13 kg/m.   General appearance:  Appears well, obese Thyroid:  Symmetrical, normal in size, without palpable masses or nodularity. Respiratory  Auscultation:  Clear without wheezing or rhonchi Cardiovascular  Auscultation:  Regular rate, without rubs, murmurs or gallops  Edema/varicosities:  Not grossly evident Abdominal  Soft,nontender, without masses, guarding or rebound.  Liver/spleen:  No organomegaly noted  Hernia:  None appreciated  Skin  Inspection:  Grossly normal   Breasts:  Examined lying and sitting, pendulous breasts     Right: Without masses, retractions, discharge or axillary adenopathy.     Left: Without masses, retractions, discharge or axillary adenopathy. Gentitourinary   Inguinal/mons:  Normal without inguinal adenopathy  External genitalia:  Normal  BUS/Urethra/Skene's glands:  Normal  Vagina:  Normal  Cervix:  Absent   Uterus:  Absent   Adnexa/parametria:     Rt: Without masses or tenderness.   Lt: Without masses or tenderness.  Anus and perineum: Normal   Assessment/Plan:  [name]  for annual exam with complaints of feeling down since breast cancer diagnosis.   2011 TAH for fibroids  HIV, undetectable load, managed at [facility] Right breast cancer, chemotherapy and radiation completed 04/2019 Obesity HTN, type II diabetes, hypercholesterolemia managed by primary care HSV- no outbreaks  Anxiety/depression   Plan: Discussion on importance of decreasing simple sugars, has had numerous elevated blood sugars in the 2-300 range.  States occasionally overeats, states ate chocolate last evening for no reason.  Sleep hygiene discussed importance of establishing a more routine bedtime and rising time.  SBEs, continue annual screening mammogram, follow-up plan for colonoscopy, importance of exercise/daily walking, self-care, decreasing calories/carbs, and plan to return to work. Reassurance given regarding hot flashes. Condom use encouraged when sexual activity resumes. Encouraged to seek counseling services at [facility] Pap,     [name] [facility], 3:49 PM 06/20/2019

## 2019-06-21 ENCOUNTER — Encounter: Payer: Self-pay | Admitting: Adult Health

## 2019-06-22 LAB — PAP IG W/ RFLX HPV ASCU

## 2019-06-25 ENCOUNTER — Encounter: Payer: Self-pay | Admitting: Gastroenterology

## 2019-06-27 ENCOUNTER — Ambulatory Visit
Admission: RE | Admit: 2019-06-27 | Discharge: 2019-06-27 | Disposition: A | Discharge status: Home / Self Care | Payer: BC Managed Care – PPO | Source: Ambulatory Visit | Attending: Oncology | Admitting: Oncology

## 2019-06-27 ENCOUNTER — Other Ambulatory Visit: Payer: Self-pay

## 2019-06-27 ENCOUNTER — Other Ambulatory Visit: Payer: Self-pay | Admitting: Internal Medicine

## 2019-06-27 DIAGNOSIS — C50411 Malignant neoplasm of upper-outer quadrant of right female breast: Secondary | ICD-10-CM

## 2019-06-27 DIAGNOSIS — Z170 Estrogen receptor positive status [ER+]: Secondary | ICD-10-CM

## 2019-06-27 DIAGNOSIS — C771 Secondary and unspecified malignant neoplasm of intrathoracic lymph nodes: Secondary | ICD-10-CM

## 2019-06-27 DIAGNOSIS — C50919 Malignant neoplasm of unspecified site of unspecified female breast: Secondary | ICD-10-CM

## 2019-07-02 ENCOUNTER — Ambulatory Visit (INDEPENDENT_AMBULATORY_CARE_PROVIDER_SITE_OTHER): Payer: BC Managed Care – PPO | Admitting: Internal Medicine

## 2019-07-02 ENCOUNTER — Encounter: Payer: Self-pay | Admitting: Internal Medicine

## 2019-07-02 ENCOUNTER — Other Ambulatory Visit: Payer: Self-pay

## 2019-07-02 DIAGNOSIS — U071 COVID-19: Secondary | ICD-10-CM | POA: Insufficient documentation

## 2019-07-02 DIAGNOSIS — J069 Acute upper respiratory infection, unspecified: Secondary | ICD-10-CM

## 2019-07-02 DIAGNOSIS — E1169 Type 2 diabetes mellitus with other specified complication: Secondary | ICD-10-CM

## 2019-07-02 DIAGNOSIS — Z20822 Contact with and (suspected) exposure to covid-19: Secondary | ICD-10-CM

## 2019-07-02 DIAGNOSIS — J4520 Mild intermittent asthma, uncomplicated: Secondary | ICD-10-CM

## 2019-07-02 MED ORDER — AZITHROMYCIN 250 MG PO TABS: ORAL_TABLET | ORAL | Status: DC

## 2019-07-02 NOTE — Assessment & Plan Note (Signed)
stable overall by history and exam, recent data reviewed with pt, and pt to continue medical treatment as before,  to f/u any worsening symptoms or concerns  

## 2019-07-02 NOTE — Progress Notes (Signed)
Patient ID: Kelly Rowe, female   DOB: September 06, 1970, 48 y.o.   MRN: [id number]  Virtual Visit via Video Note  I connected with Kelly Rowe on 07/02/19 at  4:00 PM EST by a video enabled telemedicine application and verified that I am speaking with the correct person using two identifiers.  Location: Patient: at home Provider: at office   I discussed the limitations of evaluation and management by telemedicine and the availability of in person appointments. The patient expressed understanding and agreed to proceed.  History of Present Illness:  Here with 2-3 days acute onset fever, HA, myalgias, facial pain, pressure, headache, general weakness and malaise, and off colored d/c, with mild ST and scant prod cough, but pt denies chest pain, wheezing, increased sob or doe, orthopnea, PND, increased LE swelling, palpitations, dizziness or syncope. Son found [place] [facility] yesterday and lives with her  Pt denies new neurological symptoms such as new headache, or facial or extremity weakness or numbness   Pt denies polydipsia, polyuria Past Medical History:  Diagnosis Date  . ANEMIA-IRON DEFICIENCY 01/30/2010  . ANXIETY 11/27/2007  . Arthritis   . Asthma 02/25/2011  . Cancer ([place])    breast  . Carbuncle and furuncle of trunk 04/16/2010  . Chlamydia infection 03/21/2008  . DIABETES MELLITUS, TYPE II 08/02/2007  . Edema 01/30/2010  . ELEVATED BLOOD PRESSURE WITHOUT DIAGNOSIS OF HYPERTENSION 08/02/2007  . Family history of breast cancer   . Family history of colon cancer   . Family history of lung cancer   . FREQUENCY, URINARY 05/19/2009  . GENITAL HERPES 03/12/2009  . GERD (gastroesophageal reflux disease)   . History of kidney stones   . History of radiation therapy 07/27/18- 09/06/18   Right Breast, Right Axillary and Supraclavicular nodes 50 Gy in 25 fractions, Right Breast Boost 10 Gy in 5 fractions  . HIV (human immunodeficiency virus infection) ([place]) 2009  . HIV INFECTION 03/12/2009  . HSV  (herpes simplex virus) infection   . HYPERLIPIDEMIA 08/02/2007  . Hypertension   . Metrorrhagia 03/21/2008  . PONV (postoperative nausea and vomiting)    woke up crying per patient   . RETENTION, URINE 05/19/2009  . Sleep apnea   . Trichomonas infection 01/19/2010  . VITAMIN D DEFICIENCY 01/30/2010   Past Surgical History:  Procedure Laterality Date  . ABDOMINAL HYSTERECTOMY  07/22/2010   TAH WITH PRESERVATION OF BOTH TUBES AND OVARIES  . BREAST LUMPECTOMY Right 2019  . BREAST LUMPECTOMY WITH RADIOACTIVE SEED AND SENTINEL LYMPH NODE BIOPSY Right 02/23/2018   Procedure: BREAST LUMPECTOMY WITH RADIOACTIVE SEED AND SENTINEL LYMPH NODE BIOPSY;  Surgeon: Stark Klein, MD;  Location: [facility];  Service: General;  Laterality: Right;  . CHOLECYSTECTOMY    . ENDOMETRIAL ABLATION  01/11/2008   HER OPTION  . ESOPHAGOGASTRODUODENOSCOPY    . MULTIPLE TOOTH EXTRACTIONS    . PORT-A-CATH REMOVAL Left 05/08/2019   Procedure: REMOVAL PORT-A-CATH;  Surgeon: Stark Klein, MD;  Location: [facility];  Service: General;  Laterality: Left;  . PORTACATH PLACEMENT Left 02/23/2018   Procedure: INSERTION PORT-A-CATH;  Surgeon: Stark Klein, MD;  Location: [facility];  Service: General;  Laterality: Left;  . PORTACATH PLACEMENT N/A 04/21/2018   Procedure: PORT-A-CATH REVISION;  Surgeon: Stark Klein, MD;  Location: [facility];  Service: General;  Laterality: N/A;  . TUBAL LIGATION    . WISDOM TOOTH EXTRACTION      reports that she has never smoked. She has never used smokeless tobacco. She reports  current alcohol use. She reports that she does not use drugs. family history includes Breast cancer in her paternal aunt, paternal aunt, and paternal aunt; Cancer in her father; Colon cancer in her father, paternal grandfather, and another family member; Diabetes in her father and mother; Heart attack (age of onset: 23) in her maternal grandfather; Heart disease in an other family member; Hypertension in her mother; Lung  cancer in her paternal aunt; Lung cancer (age of onset: 22) in her maternal grandmother; Prostate cancer in an other family member; Stroke in her paternal uncle and another family member. Allergies  Allergen Reactions  . Metformin And Related Other (See Comments)    Bowel frequency  . Levaquin [Levofloxacin In D5w] Nausea And Vomiting and Rash  . Penicillins Swelling and Rash    Has patient had a PCN reaction causing immediate rash, facial/tongue/throat swelling, SOB or lightheadedness with hypotension: Yes Has patient had a PCN reaction causing severe rash involving mucus membranes or skin necrosis: No Has patient had a PCN reaction that required hospitalization: Yes Has patient had a PCN reaction occurring within the last 10 years: No If all of the above answers are "NO", then may proceed with Cephalosporin use.   Current Outpatient Medications on File Prior to Visit  Medication Sig Dispense Refill  . albuterol (PROVENTIL HFA;VENTOLIN HFA) 108 (90 Base) MCG/ACT inhaler Inhale 2 puffs into the lungs every 6 (six) hours as needed for wheezing or shortness of breath.    . bictegravir-emtricitabine-tenofovir AF (BIKTARVY) 50-200-25 MG TABS tablet Take 1 tablet by mouth daily.    . blood glucose meter kit and supplies Dispense based on patient and insurance preference. Use up to four times daily as directed. (FOR ICD-10 E10.9, E11.9). 1 each 0  . carvedilol (COREG) 6.25 MG tablet Take 1 tablet (6.25 mg total) by mouth 2 (two) times daily. 60 tablet 11  . Dulaglutide (TRULICITY) 1.5 UP/1.0RP SOPN 0.5 ml weekly SQ 6 mL 3  . empagliflozin (JARDIANCE) 25 MG TABS tablet Take 25 mg by mouth daily. 90 tablet 3  . ferrous gluconate (IRON 27) 240 (27 FE) MG tablet Take 240 mg by mouth daily as needed (iron).     . gabapentin (NEURONTIN) 300 MG capsule Take 1 capsule (300 mg total) by mouth at bedtime. 90 capsule 4  . glipiZIDE (GLUCOTROL XL) 10 MG 24 hr tablet Take 1 tablet (10 mg total) by mouth daily  with breakfast. 90 tablet 3  . hydrocortisone (ANUSOL-HC) 2.5 % rectal cream Place 1 application rectally 2 (two) times daily. 30 g 1  . hydrocortisone (ANUSOL-HC) 25 MG suppository Place 1 suppository (25 mg total) rectally every 12 (twelve) hours. 12 suppository 1  . ibuprofen (ADVIL,MOTRIN) 800 MG tablet Take 1 tablet (800 mg total) by mouth every 8 (eight) hours as needed. 60 tablet 0  . Lancets (ONETOUCH DELICA PLUS [id number]) MISC USE TO TEST BLOOD SUGAR UP TO 4 TIMES DAILY 100 each 5  . loratadine (CLARITIN) 10 MG tablet Take 10 mg by mouth daily.    Marland Kitchen LORazepam (ATIVAN) 0.5 MG tablet Take 1 tablet (0.5 mg total) by mouth at bedtime as needed (Nausea or vomiting). 30 tablet 0  . losartan (COZAAR) 25 MG tablet Take 1 tablet (25 mg total) by mouth daily. 30 tablet 11  . Multiple Vitamin (MULTIVITAMIN WITH MINERALS) TABS tablet Take 1 tablet by mouth daily.    Marland Kitchen omeprazole (PRILOSEC) 40 MG capsule Take 1 capsule (40 mg total) by mouth daily. 30 capsule  0  . ONETOUCH VERIO test strip USE 1 STRIP TO CHECK GLUCOSE 4 TIMES DAILY AS DIRECTED 100 each 5  . oxyCODONE (OXY IR/ROXICODONE) 5 MG immediate release tablet Take 1 tablet (5 mg total) by mouth every 6 (six) hours as needed for severe pain. 15 tablet 0  . pioglitazone (ACTOS) 45 MG tablet Take 1 tablet (45 mg total) by mouth daily. 90 tablet 3  . tamoxifen (NOLVADEX) 20 MG tablet Take 20 mg by mouth daily.    . valACYclovir (VALTREX) 500 MG tablet Take twice daily for 3-5 days 30 tablet 12  . venlafaxine XR (EFFEXOR-XR) 37.5 MG 24 hr capsule Take 1 capsule (37.5 mg total) by mouth daily with breakfast. 90 capsule 4  . zolpidem (AMBIEN) 10 MG tablet TAKE 1 TABLET BY MOUTH AT BEDTIME AS NEEDED FOR SLEEP 45 tablet 1   Current Facility-Administered Medications on File Prior to Visit  Medication Dose Route Frequency Provider Last Rate Last Dose  . sodium chloride flush (NS) 0.9 % injection 10 mL  10 mL Intracatheter PRN [name], [name], MD   10  mL at 09/04/18 1425    Observations/Objective: Alert, NAD, appropriate mood and affect, resps normal, cn 2-12 intact, moves all 4s, no visible rash or swelling Lab Results  Component Value Date   WBC 2.7 (L) 03/12/2019   HGB 11.9 (L) 03/12/2019   HCT 37.7 03/12/2019   PLT 197 03/12/2019   GLUCOSE 174 (H) 05/04/2019   CHOL 199 10/03/2018   TRIG 202.0 (H) 10/03/2018   HDL 34.10 (L) 10/03/2018   LDLDIRECT 143.0 10/03/2018   LDLCALC 135 (H) 09/08/2017   ALT 21 03/12/2019   AST 15 03/12/2019   NA 136 05/04/2019   K 4.3 05/04/2019   CL 98 05/04/2019   CREATININE 0.63 05/04/2019   BUN 6 05/04/2019   CO2 25 05/04/2019   TSH 1.86 10/03/2018   HGBA1C 10.1 (A) 04/05/2019   MICROALBUR <0.7 10/03/2018   Assessment and Plan: See notes  Follow Up Instructions: See notes  I discussed the assessment and treatment plan with the patient. The patient was provided an opportunity to ask questions and all were answered. The patient agreed with the plan and demonstrated an understanding of the instructions.   The patient was advised to call back or seek an in-person evaluation if the symptoms worsen or if the condition fails to improve as anticipated.   Cathlean Cower, MD

## 2019-07-02 NOTE — Assessment & Plan Note (Signed)
Mild to mod, for antibx course, and COVID testing,  to f/u any worsening symptoms or concerns

## 2019-07-02 NOTE — Patient Instructions (Signed)
Please take all new medication as prescribed  Please go for the COVID testing tomorrow  Please continue all other medications as before, and refills have been done if requested.  Please have the pharmacy call with any other refills you may need.  Please continue your efforts at being more active, low cholesterol diet, and weight control.  Please keep your appointments with your specialists as you may have planned

## 2019-07-03 ENCOUNTER — Telehealth: Payer: Self-pay | Admitting: Oncology

## 2019-07-03 LAB — NOVEL CORONAVIRUS, NAA: SARS-CoV-2, NAA: DETECTED — AB

## 2019-07-03 NOTE — Telephone Encounter (Signed)
Faxed medical records from 3/20 thru present to [employer] @ [phone] Record [id number]

## 2019-07-04 ENCOUNTER — Telehealth: Payer: Self-pay | Admitting: Critical Care Medicine

## 2019-07-04 ENCOUNTER — Other Ambulatory Visit: Payer: Self-pay | Admitting: Nurse Practitioner

## 2019-07-04 DIAGNOSIS — U071 COVID-19: Secondary | ICD-10-CM

## 2019-07-04 DIAGNOSIS — E1169 Type 2 diabetes mellitus with other specified complication: Secondary | ICD-10-CM

## 2019-07-04 NOTE — Telephone Encounter (Signed)
-----   Message from Dimple Nanas, RN sent at 07/04/2019 10:22 AM EST ----- Regarding: Positive Results Positive Results ----- Message ----- From: Interface, [facility] Lab Results In Sent: 07/03/2019  10:41 PM EST To: Mobile Screening Testing Result Pool

## 2019-07-04 NOTE — Progress Notes (Signed)
I connected by phone with Kelly Rowe on 07/04/2019 at 1:07 PM to discuss the potential use of an new treatment for mild to moderate COVID-19 viral infection in non-hospitalized patients.  This patient is a 48 y.o. female that meets the FDA criteria for Emergency Use Authorization of bamlanivimab or casirivimab\imdevimab.  Has a (+) direct SARS-CoV-2 viral test result  Has mild or moderate COVID-19   Is ? 50 years of age and weighs ? 40 kg  Is NOT hospitalized due to COVID-19  Is NOT requiring oxygen therapy or requiring an increase in baseline oxygen flow rate due to COVID-19  Is within 10 days of symptom onset  Has at least one of the high risk factor(s) for progression to severe COVID-19 and/or hospitalization as defined in EUA.  Specific high risk criteria : Diabetes Patient is managed for the following:  Patient Active Problem List   Diagnosis Date Noted  . Upper respiratory infection 07/02/2019  . Type 2 diabetes mellitus with other specified complication ([place]) [id number]  . Hematochezia 03/30/2018  . Insomnia 03/30/2018  . Port-A-Cath in place 03/29/2018  . Genetic testing 01/30/2018  . 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 Family history of breast cancer   . Family history of colon cancer   . Family history of lung cancer   . Diabetes mellitus type 2 without retinopathy ([place]) 01/18/2018  . Morbid obesity ([place]) 01/18/2018  . Malignant neoplasm of upper-outer quadrant of right breast in female, estrogen receptor positive ([place]) 01/17/2018  . Achilles tendinitis 11/28/2017  . Hot flashes 09/30/2017  . Hypertension 09/08/2017  . Contusion of left knee and lower leg 11/18/2015  . Mild intermittent asthma 08/15/2013  . BV (bacterial vaginosis) 08/11/2012  . Right knee pain 07/05/2012  . GERD (gastroesophageal reflux disease) 07/05/2012  . GC  (gonococcus) 05/30/2012  . Chlamydia 05/30/2012  . Left knee pain 01/12/2012  . Cough 11/01/2011  . Encounter for long-term (current) use of high-risk medication 02/25/2011  . Preventative health care 02/14/2011  . VITAMIN D DEFICIENCY 01/30/2010  . ANEMIA-IRON DEFICIENCY 01/30/2010  . Edema 01/30/2010  . HIV (human immunodeficiency virus infection) ([place]) 03/12/2009  . GENITAL HERPES 03/12/2009  . Anxiety state 11/27/2007  . 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 Hyperlipidemia 08/02/2007    I have spoken and communicated the following to the patient or parent/caregiver:  1. FDA has authorized the emergency use of bamlanivimab for the treatment of mild to moderate COVID-19 in adults and pediatric patients with positive results of direct SARS-CoV-2 viral testing who are 33 years of age and older weighing at least 40 kg, and who are at high risk for progressing to severe COVID-19 and/or hospitalization.  2. The significant known and potential risks and benefits of bamlanivimab, and the extent to which such potential risks and benefits are unknown.  3. Information on available alternative treatments and the risks and benefits of those alternatives, including clinical trials.  4. Patients treated with bamlanivimab should continue to self-isolate and use infection control measures (e.g., wear mask, isolate, social distance, avoid sharing personal items, clean and disinfect "high touch" surfaces, and frequent handwashing) according to CDC guidelines.   5. The patient or parent/caregiver has the option to accept or refuse bamlanivimab.  After reviewing this information with the patient,  The patient agreed to proceed with receiving the infusion of bamlanivimab and will be provided a copy of the Fact sheet prior to receiving the infusion.[name] 07/04/2019  1:07 PM

## 2019-07-04 NOTE — Telephone Encounter (Signed)
Earlier today I communicated with this patient and informed her of her COVID-19 diagnosis.  This patient's been ill since Monday, 7 December.  She meets criteria for monoclonal antibody because of her obesity and diabetes.  She is having headache cough and sore throat.  She also has nasal congestion.  Subsequently I had the nurse practitioner contact her and she is agreed to the monoclonal antibody infusion which is scheduled this week

## 2019-07-05 ENCOUNTER — Telehealth: Payer: Self-pay | Admitting: Internal Medicine

## 2019-07-05 NOTE — Telephone Encounter (Signed)
Sorry for the upset  Zpack not indicated as COVID infection is not treated with zpack  Please follow instructions as per previous phone note

## 2019-07-05 NOTE — Telephone Encounter (Signed)
Pt called stating she is needing the z pack sent to the pharmacy from 07/02/2019. Pt is very upset. Please advise.     [facility] ([address] [place] - [address]  [id number] [place] ([place]) [place] [address]  Phone: [phone] Fax: [phone]  Not a 24 hour pharmacy; exact hours not known.

## 2019-07-05 NOTE — Telephone Encounter (Signed)
Pt has been informed and expressed understanding.  

## 2019-07-06 ENCOUNTER — Ambulatory Visit (HOSPITAL_COMMUNITY): Payer: BC Managed Care – PPO | Attending: Pulmonary Disease

## 2019-10-03 ENCOUNTER — Ambulatory Visit: Payer: BC Managed Care – PPO | Admitting: Internal Medicine

## 2019-10-03 ENCOUNTER — Other Ambulatory Visit: Payer: Self-pay

## 2019-10-03 ENCOUNTER — Encounter: Payer: Self-pay | Admitting: Internal Medicine

## 2019-10-03 VITALS — BP 114/80 | HR 75 | Temp 98.5°F | Ht 61.0 in | Wt 266.0 lb

## 2019-10-03 DIAGNOSIS — E08319 Diabetes mellitus due to underlying condition with unspecified diabetic retinopathy without macular edema: Secondary | ICD-10-CM | POA: Diagnosis not present

## 2019-10-03 DIAGNOSIS — D509 Iron deficiency anemia, unspecified: Secondary | ICD-10-CM | POA: Diagnosis not present

## 2019-10-03 DIAGNOSIS — E538 Deficiency of other specified B group vitamins: Secondary | ICD-10-CM | POA: Diagnosis not present

## 2019-10-03 DIAGNOSIS — E1169 Type 2 diabetes mellitus with other specified complication: Secondary | ICD-10-CM

## 2019-10-03 DIAGNOSIS — Z0000 Encounter for general adult medical examination without abnormal findings: Secondary | ICD-10-CM | POA: Diagnosis not present

## 2019-10-03 DIAGNOSIS — E611 Iron deficiency: Secondary | ICD-10-CM | POA: Diagnosis not present

## 2019-10-03 DIAGNOSIS — E559 Vitamin D deficiency, unspecified: Secondary | ICD-10-CM

## 2019-10-03 LAB — CBC WITH DIFFERENTIAL/PLATELET
Basophils Absolute: 0 10*3/uL (ref 0.0–0.1)
Basophils Relative: 0.7 % (ref 0.0–3.0)
Eosinophils Absolute: 0 10*3/uL (ref 0.0–0.7)
Eosinophils Relative: 0.4 % (ref 0.0–5.0)
HCT: 38.1 % (ref 36.0–46.0)
Hemoglobin: 12.2 g/dL (ref 12.0–15.0)
Lymphocytes Relative: 43.3 % (ref 12.0–46.0)
Lymphs Abs: 1.2 10*3/uL (ref 0.7–4.0)
MCHC: 32.1 g/dL (ref 30.0–36.0)
MCV: 88.5 fl (ref 78.0–100.0)
Monocytes Absolute: 0.3 10*3/uL (ref 0.1–1.0)
Monocytes Relative: 10.4 % (ref 3.0–12.0)
Neutro Abs: 1.2 10*3/uL — ABNORMAL LOW (ref 1.4–7.7)
Neutrophils Relative %: 45.2 % (ref 43.0–77.0)
Platelets: 192 10*3/uL (ref 150.0–400.0)
RBC: 4.3 Mil/uL (ref 3.87–5.11)
RDW: 15 % (ref 11.5–15.5)
WBC: 2.7 10*3/uL — ABNORMAL LOW (ref 4.0–10.5)

## 2019-10-03 LAB — HEMOGLOBIN A1C: Hgb A1c MFr Bld: 9.6 % — ABNORMAL HIGH (ref 4.6–6.5)

## 2019-10-03 LAB — BASIC METABOLIC PANEL
BUN: 8 mg/dL (ref 6–23)
CO2: 29 mEq/L (ref 19–32)
Calcium: 9.2 mg/dL (ref 8.4–10.5)
Chloride: 101 mEq/L (ref 96–112)
Creatinine, Ser: 0.65 mg/dL (ref 0.40–1.20)
GFR: 116.38 mL/min (ref >60.00)
Glucose, Bld: 116 mg/dL — ABNORMAL HIGH (ref 70–99)
Potassium: 3.5 mEq/L (ref 3.5–5.1)
Sodium: 138 mEq/L (ref 135–145)

## 2019-10-03 LAB — MICROALBUMIN / CREATININE URINE RATIO
Creatinine,U: 154.4 mg/dL
Microalb Creat Ratio: 0.5 mg/g (ref 0.0–30.0)
Microalb, Ur: <0.7 mg/dL (ref 0.0–1.9)

## 2019-10-03 LAB — IBC PANEL
Iron: 75 ug/dL (ref 42–145)
Saturation Ratios: 20.9 % (ref 20.0–50.0)
Transferrin: 256 mg/dL (ref 212.0–360.0)

## 2019-10-03 LAB — URINALYSIS, ROUTINE W REFLEX MICROSCOPIC
Bilirubin Urine: NEGATIVE
Hgb urine dipstick: NEGATIVE
Leukocytes,Ua: NEGATIVE
Nitrite: NEGATIVE
Specific Gravity, Urine: >=1.03 — AB (ref 1.000–1.030)
Total Protein, Urine: NEGATIVE
Urine Glucose: NEGATIVE
Urobilinogen, UA: 0.2 (ref 0.0–1.0)
pH: 5 (ref 5.0–8.0)

## 2019-10-03 LAB — LIPID PANEL
Cholesterol: 183 mg/dL (ref 0–200)
HDL: 50.6 mg/dL (ref >39.00)
LDL Cholesterol: 107 mg/dL — ABNORMAL HIGH (ref 0–99)
NonHDL: 131.96
Total CHOL/HDL Ratio: 4
Triglycerides: 124 mg/dL (ref 0.0–149.0)
VLDL: 24.8 mg/dL (ref 0.0–40.0)

## 2019-10-03 LAB — HEPATIC FUNCTION PANEL
ALT: 19 U/L (ref 0–35)
AST: 18 U/L (ref 0–37)
Albumin: 4 g/dL (ref 3.5–5.2)
Alkaline Phosphatase: 100 U/L (ref 39–117)
Bilirubin, Direct: 0.1 mg/dL (ref 0.0–0.3)
Total Bilirubin: 0.3 mg/dL (ref 0.2–1.2)
Total Protein: 7.3 g/dL (ref 6.0–8.3)

## 2019-10-03 LAB — TSH: TSH: 1.69 u[IU]/mL (ref 0.35–4.50)

## 2019-10-03 LAB — VITAMIN B12: Vitamin B-12: 834 pg/mL (ref 211–911)

## 2019-10-03 LAB — VITAMIN D 25 HYDROXY (VIT D DEFICIENCY, FRACTURES): VITD: 16.1 ng/mL — ABNORMAL LOW (ref 30.00–100.00)

## 2019-10-03 NOTE — Progress Notes (Signed)
Subjective:    Patient ID: Kelly Rowe, female    DOB: [birth year], 49 y.o.   MRN: [id number]  HPI  Here for wellness and f/u;  Overall doing ok;  Pt denies Chest pain, worsening SOB, DOE, wheezing, orthopnea, PND, worsening LE edema, palpitations, dizziness or syncope.  Pt denies neurological change such as new headache, facial or extremity weakness.  Pt denies polydipsia, polyuria, or low sugar symptoms. Pt states overall good compliance with treatment and medications, good tolerability, and has been trying to follow appropriate diet.  Pt denies worsening depressive symptoms, suicidal ideation or panic. No fever, night sweats, wt loss, loss of appetite, or other constitutional symptoms.  Pt states good ability with ADL's, has low fall risk, home safety reviewed and adequate, no other significant changes in hearing or vision, and only occasionally active with exercise.  S/p covid pna with improved dyspnea no longer needing the inhaler after 2 mo.  Past Medical History:  Diagnosis Date  . ANEMIA-IRON DEFICIENCY 01/30/2010  . ANXIETY 11/27/2007  . Arthritis   . Asthma 02/25/2011  . Cancer ([place])    breast  . Carbuncle and furuncle of trunk 04/16/2010  . Chlamydia infection 03/21/2008  . DIABETES MELLITUS, TYPE II 08/02/2007  . Edema 01/30/2010  . ELEVATED BLOOD PRESSURE WITHOUT DIAGNOSIS OF HYPERTENSION 08/02/2007  . Family history of breast cancer   . Family history of colon cancer   . Family history of lung cancer   . FREQUENCY, URINARY 05/19/2009  . GENITAL HERPES 03/12/2009  . GERD (gastroesophageal reflux disease)   . History of kidney stones   . History of radiation therapy 07/27/18- 09/06/18   Right Breast, Right Axillary and Supraclavicular nodes 50 Gy in 25 fractions, Right Breast Boost 10 Gy in 5 fractions  . HIV (human immunodeficiency virus infection) ([place]) 2009  . HIV INFECTION 03/12/2009  . HSV (herpes simplex virus) infection   . HYPERLIPIDEMIA 08/02/2007  . Hypertension   .  Metrorrhagia 03/21/2008  . PONV (postoperative nausea and vomiting)    woke up crying per patient   . RETENTION, URINE 05/19/2009  . Sleep apnea   . Trichomonas infection 01/19/2010  . VITAMIN D DEFICIENCY 01/30/2010   Past Surgical History:  Procedure Laterality Date  . ABDOMINAL HYSTERECTOMY  07/22/2010   TAH WITH PRESERVATION OF BOTH TUBES AND OVARIES  . BREAST LUMPECTOMY Right 2019  . BREAST LUMPECTOMY WITH RADIOACTIVE SEED AND SENTINEL LYMPH NODE BIOPSY Right 02/23/2018   Procedure: BREAST LUMPECTOMY WITH RADIOACTIVE SEED AND SENTINEL LYMPH NODE BIOPSY;  Surgeon: Stark Klein, MD;  Location: [facility];  Service: General;  Laterality: Right;  . CHOLECYSTECTOMY    . ENDOMETRIAL ABLATION  01/11/2008   HER OPTION  . ESOPHAGOGASTRODUODENOSCOPY    . MULTIPLE TOOTH EXTRACTIONS    . PORT-A-CATH REMOVAL Left 05/08/2019   Procedure: REMOVAL PORT-A-CATH;  Surgeon: Stark Klein, MD;  Location: [facility];  Service: General;  Laterality: Left;  . PORTACATH PLACEMENT Left 02/23/2018   Procedure: INSERTION PORT-A-CATH;  Surgeon: Stark Klein, MD;  Location: [facility];  Service: General;  Laterality: Left;  . PORTACATH PLACEMENT N/A 04/21/2018   Procedure: PORT-A-CATH REVISION;  Surgeon: Stark Klein, MD;  Location: [facility];  Service: General;  Laterality: N/A;  . TUBAL LIGATION    . WISDOM TOOTH EXTRACTION      reports that she has never smoked. She has never used smokeless tobacco. She reports current alcohol use. She reports that she does not use drugs. family history  includes Breast cancer in her paternal aunt, paternal aunt, and paternal aunt; Cancer in her father; Colon cancer in her father, paternal grandfather, and another family member; Diabetes in her father and mother; Heart attack (age of onset: 65) in her maternal grandfather; Heart disease in an other family member; Hypertension in her mother; Lung cancer in her paternal aunt; Lung cancer (age of onset: 25) in her maternal  grandmother; Prostate cancer in an other family member; Stroke in her paternal uncle and another family member. Allergies  Allergen Reactions  . Metformin And Related Other (See Comments)    Bowel frequency  . Levaquin [Levofloxacin In D5w] Nausea And Vomiting and Rash  . Penicillins Swelling and Rash    Has patient had a PCN reaction causing immediate rash, facial/tongue/throat swelling, SOB or lightheadedness with hypotension: Yes Has patient had a PCN reaction causing severe rash involving mucus membranes or skin necrosis: No Has patient had a PCN reaction that required hospitalization: Yes Has patient had a PCN reaction occurring within the last 10 years: No If all of the above answers are "NO", then may proceed with Cephalosporin use.   Current Outpatient Medications on File Prior to Visit  Medication Sig Dispense Refill  . albuterol (PROVENTIL HFA;VENTOLIN HFA) 108 (90 Base) MCG/ACT inhaler Inhale 2 puffs into the lungs every 6 (six) hours as needed for wheezing or shortness of breath.    . bictegravir-emtricitabine-tenofovir AF (BIKTARVY) 50-200-25 MG TABS tablet Take 1 tablet by mouth daily.    . blood glucose meter kit and supplies Dispense based on patient and insurance preference. Use up to four times daily as directed. (FOR ICD-10 E10.9, E11.9). 1 each 0  . carvedilol (COREG) 6.25 MG tablet Take 1 tablet (6.25 mg total) by mouth 2 (two) times daily. 60 tablet 11  . ferrous gluconate (IRON 27) 240 (27 FE) MG tablet Take 240 mg by mouth daily as needed (iron).     Marland Kitchen glipiZIDE (GLUCOTROL XL) 10 MG 24 hr tablet Take 1 tablet (10 mg total) by mouth daily with breakfast. 90 tablet 3  . hydrocortisone (ANUSOL-HC) 25 MG suppository Place 1 suppository (25 mg total) rectally every 12 (twelve) hours. 12 suppository 1  . ibuprofen (ADVIL,MOTRIN) 800 MG tablet Take 1 tablet (800 mg total) by mouth every 8 (eight) hours as needed. 60 tablet 0  . Lancets (ONETOUCH DELICA PLUS [id number]) MISC USE  TO TEST BLOOD SUGAR UP TO 4 TIMES DAILY 100 each 5  . loratadine (CLARITIN) 10 MG tablet Take 10 mg by mouth daily.    . Multiple Vitamin (MULTIVITAMIN WITH MINERALS) TABS tablet Take 1 tablet by mouth daily.    Marland Kitchen omeprazole (PRILOSEC) 40 MG capsule Take 1 capsule (40 mg total) by mouth daily. 30 capsule 0  . ONETOUCH VERIO test strip USE 1 STRIP TO CHECK GLUCOSE 4 TIMES DAILY AS DIRECTED 100 each 5  . pioglitazone (ACTOS) 45 MG tablet Take 1 tablet (45 mg total) by mouth daily. 90 tablet 3  . tamoxifen (NOLVADEX) 20 MG tablet Take 20 mg by mouth daily.    . valACYclovir (VALTREX) 500 MG tablet Take twice daily for 3-5 days 30 tablet 12  . venlafaxine XR (EFFEXOR-XR) 37.5 MG 24 hr capsule Take 1 capsule (37.5 mg total) by mouth daily with breakfast. 90 capsule 4  . zolpidem (AMBIEN) 10 MG tablet TAKE 1 TABLET BY MOUTH AT BEDTIME AS NEEDED FOR SLEEP 45 tablet 1  . empagliflozin (JARDIANCE) 25 MG TABS tablet Take 25 mg  by mouth daily. (Patient not taking: Reported on 10/03/2019) 90 tablet 3  . gabapentin (NEURONTIN) 300 MG capsule Take 1 capsule (300 mg total) by mouth at bedtime. (Patient not taking: Reported on 10/03/2019) 90 capsule 4  . hydrocortisone (ANUSOL-HC) 2.5 % rectal cream Place 1 application rectally 2 (two) times daily. (Patient not taking: Reported on 10/03/2019) 30 g 1  . LORazepam (ATIVAN) 0.5 MG tablet Take 1 tablet (0.5 mg total) by mouth at bedtime as needed (Nausea or vomiting). (Patient not taking: Reported on 10/03/2019) 30 tablet 0  . losartan (COZAAR) 25 MG tablet Take 1 tablet (25 mg total) by mouth daily. 30 tablet 11  . oxyCODONE (OXY IR/ROXICODONE) 5 MG immediate release tablet Take 1 tablet (5 mg total) by mouth every 6 (six) hours as needed for severe pain. (Patient not taking: Reported on 10/03/2019) 15 tablet 0   Current Facility-Administered Medications on File Prior to Visit  Medication Dose Route Frequency Provider Last Rate Last Admin  . sodium chloride flush (NS)  0.9 % injection 10 mL  10 mL Intracatheter PRN [name], [name], MD   10 mL at 09/04/18 1425   Review of Systems All otherwise neg per pt     Objective:   Physical Exam BP 114/80   Pulse 75   Temp 98.5 F (36.9 C)   Ht 5' 1"  (1.549 m)   Wt 266 lb (120.7 kg)   LMP 06/21/2010   SpO2 98%   BMI 50.26 kg/m  VS noted,  Constitutional: Pt appears in NAD HENT: Head: NCAT.  Right Ear: External ear normal.  Left Ear: External ear normal.  Eyes: . Pupils are equal, round, and reactive to light. Conjunctivae and EOM are normal Nose: without d/c or deformity Neck: Neck supple. Gross normal ROM Cardiovascular: Normal rate and regular rhythm.   Pulmonary/Chest: Effort normal and breath sounds without rales or wheezing.  Abd:  Soft, NT, ND, + BS, no organomegaly Neurological: Pt is alert. At baseline orientation, motor grossly intact Skin: Skin is warm. No rashes, other new lesions, no LE edema Psychiatric: Pt behavior is normal without agitation  All otherwise neg per pt Lab Results  Component Value Date   WBC 2.7 (L) 10/03/2019   HGB 12.2 10/03/2019   HCT 38.1 10/03/2019   PLT 192.0 10/03/2019   GLUCOSE 116 (H) 10/03/2019   CHOL 183 10/03/2019   TRIG 124.0 10/03/2019   HDL 50.60 10/03/2019   LDLDIRECT 143.0 10/03/2018   LDLCALC 107 (H) 10/03/2019   ALT 19 10/03/2019   AST 18 10/03/2019   NA 138 10/03/2019   K 3.5 10/03/2019   CL 101 10/03/2019   CREATININE 0.65 10/03/2019   BUN 8 10/03/2019   CO2 29 10/03/2019   TSH 1.69 10/03/2019   HGBA1C 9.6 (H) 10/03/2019   MICROALBUR <0.7 10/03/2019      Assessment & Plan:

## 2019-10-03 NOTE — Patient Instructions (Signed)
Please consider going to the [facility] clinic at the [facility] for the COVID vaccine (you should call for appt first)  Please continue all other medications as before, and refills have been done if requested.  Please have the pharmacy call with any other refills you may need.  Please continue your efforts at being more active, low cholesterol diet, and weight control.  You are otherwise up to date with prevention measures today.  Please keep your appointments with your specialists as you may have planned  Please go to the LAB at the blood drawing area for the tests to be done  You will be contacted by phone if any changes need to be made immediately.  Otherwise, you will receive a letter about your results with an explanation, but please check with MyChart first.  Please remember to sign up for MyChart if you have not done so, as this will be important to you in the future with finding out test results, communicating by private email, and scheduling acute appointments online when needed.  Please make an Appointment to return in 6 months, or sooner if needed, also with Lab Appointment for testing done 3-5 days before at the [facility] (so this is for TWO appointments - please see the scheduling desk as you leave)

## 2019-10-06 ENCOUNTER — Other Ambulatory Visit: Payer: Self-pay | Admitting: Internal Medicine

## 2019-10-06 ENCOUNTER — Encounter: Payer: Self-pay | Admitting: Internal Medicine

## 2019-10-06 MED ORDER — VITAMIN D (ERGOCALCIFEROL) 1.25 MG (50000 UNIT) PO CAPS: 50000.0000 [IU] | ORAL_CAPSULE | ORAL | 0 refills | Status: DC

## 2019-10-06 MED ORDER — ROSUVASTATIN CALCIUM 10 MG PO TABS: 10.0000 mg | ORAL_TABLET | Freq: Every day | ORAL | 3 refills | Status: DC

## 2019-10-06 MED ORDER — TRULICITY 3 MG/0.5ML ~~LOC~~ SOAJ: 3.0000 mg | SUBCUTANEOUS | 3 refills | Status: DC

## 2019-10-07 ENCOUNTER — Encounter: Payer: Self-pay | Admitting: Internal Medicine

## 2019-10-07 NOTE — Assessment & Plan Note (Signed)
stable overall by history and exam, recent data reviewed with pt, and pt to continue medical treatment as before,  to f/u any worsening symptoms or concerns  

## 2019-10-07 NOTE — Assessment & Plan Note (Signed)

## 2019-10-07 NOTE — Assessment & Plan Note (Signed)
For f/u lab 

## 2019-10-10 ENCOUNTER — Other Ambulatory Visit: Payer: Self-pay | Admitting: *Deleted

## 2019-10-10 DIAGNOSIS — Z170 Estrogen receptor positive status [ER+]: Secondary | ICD-10-CM

## 2019-10-10 DIAGNOSIS — C50411 Malignant neoplasm of upper-outer quadrant of right female breast: Secondary | ICD-10-CM

## 2019-10-10 NOTE — Progress Notes (Signed)
[facility]  [phone] Fax:([phone]) [phone]    ID: Kelly Rowe DOB: 1971-07-24  MR#: [id number]  WVP#:[id number]  Patient Care Team: Biagio Borg, MD as PCP - [name], MD as Consulting Physician (General Surgery) [name], [name], MD as Consulting Physician (Oncology) [name], MD as Attending Physician (Radiation Oncology) [name], NP as Nurse Practitioner (Obstetrics and Gynecology) Marlinda Mike, PA-C as Referring Physician (Physician Assistant) OTHER MD:   CHIEF COMPLAINT: Triple positive breast cancer  CURRENT TREATMENT:  tamoxifen   INTERVAL HISTORY: Kelly Rowe returns today for follow-up of her triple positive breast cancer.  She has been on tamoxifen since November 2020.  Hot flashes are the main problem.  We added venlafaxine at 37.5 mg and that does not seem to be helping much.  She was supposedly on gabapentin at bedtime but she has not been taking that because she thought it was for pain.  Since her last visit, she underwent bilateral diagnostic mammography with tomography at [facility] on 06/27/2019 showing: breast density category B; no evidence of malignancy in either breast.  She also underwent echocardiogram on 04/11/2019 showing an ejection fraction of 55%.  She had her port removed on 05/08/2019.   She also underwent colonoscopy on 06/14/2019 under Dr. Tarri Glenn for rectal bleeding. Aside from hemorrhoids, the exam was normal. Pathology [id number]) of a "polyp" turned out to be a normal fold of colon. Repeat colonoscopy recommended in 5 years due to family history of colon cancer.   REVIEW OF SYSTEMS: Kelly Rowe lost 5 pounds and then gained them right back.  She says she does not understand why that happened since he only had one dietary indiscretion this past weekend.  She says Dr. Jenny Reichmann has found her A1c to be up and again she says that does not make sense to her because she has been eating much  better.  She is doing protein shakes.  She has some right lymphedema problems but is not wearing her sleeve on a regular basis.  She is going to the gym regularly.  A detailed review of systems today was otherwise stable   HISTORY OF CURRENT ILLNESS: From the original intake note:  "Kelly Rowe" noticed bruising in her lateral right breast and palpated a mass  on 12/23/2017. She followed up with her gynecologist. She underwent unilateral right diagnostic mammography with tomography and right breast ultrasonography at [facility] on 01/05/2018 showing: breast density category B. There is an irregular highly suspicious mass within the right breast at the 9:30 o'clock upper outer quadrant, measuring 1.8 x 1.1 x 1.5 cm, and located 18 cm from the nipple,  Ultrasonography revealed a single morphologically abnormal lymph node in the RIGHT axilla, with cortical thickness of 6 mm.   Accordingly on 01/11/2018 she proceeded to biopsy of the right breast area in question as well as a suspicious lymph node. The pathology from this procedure showed ([id number]): Invasive ductal carcinoma grade III.  The lymph node biopsied was negative for carcinoma (concordant).. Prognostic indicators significant for: estrogen receptor, 60% positive with weak staining intensity and progesterone receptor, 10% positive with strong staining intensity. Proliferation marker Ki67 at 30%. HER2 amplified with ratios HER2/CEP17 signals 2.26 and average HER2 copies per cell 3.50  The patient's subsequent history is as detailed below.   PAST MEDICAL HISTORY: Past Medical History:  Diagnosis Date  . ANEMIA-IRON DEFICIENCY 01/30/2010  . ANXIETY 11/27/2007  . Arthritis   . Asthma 02/25/2011  . Cancer ([facility])  breast  . Carbuncle and furuncle of trunk 04/16/2010  . Chlamydia infection 03/21/2008  . DIABETES MELLITUS, TYPE II 08/02/2007  . Edema 01/30/2010  . ELEVATED BLOOD PRESSURE WITHOUT DIAGNOSIS OF HYPERTENSION 08/02/2007  . Family  history of breast cancer   . Family history of colon cancer   . Family history of lung cancer   . FREQUENCY, URINARY 05/19/2009  . GENITAL HERPES 03/12/2009  . GERD (gastroesophageal reflux disease)   . History of kidney stones   . History of radiation therapy 07/27/18- 09/06/18   Right Breast, Right Axillary and Supraclavicular nodes 50 Gy in 25 fractions, Right Breast Boost 10 Gy in 5 fractions  . HIV (human immunodeficiency virus infection) ([facility]) 2009  . HIV INFECTION 03/12/2009  . HSV (herpes simplex virus) infection   . HYPERLIPIDEMIA 08/02/2007  . Hypertension   . Metrorrhagia 03/21/2008  . PONV (postoperative nausea and vomiting)    woke up crying per patient   . RETENTION, URINE 05/19/2009  . Sleep apnea   . Trichomonas infection 01/19/2010  . VITAMIN D DEFICIENCY 01/30/2010    PAST SURGICAL HISTORY: Past Surgical History:  Procedure Laterality Date  . ABDOMINAL HYSTERECTOMY  07/22/2010   TAH WITH PRESERVATION OF BOTH TUBES AND OVARIES  . BREAST LUMPECTOMY Right 2019  . BREAST LUMPECTOMY WITH RADIOACTIVE SEED AND SENTINEL LYMPH NODE BIOPSY Right 02/23/2018   Procedure: BREAST LUMPECTOMY WITH RADIOACTIVE SEED AND SENTINEL LYMPH NODE BIOPSY;  Surgeon: Stark Klein, MD;  Location: [facility];  Service: General;  Laterality: Right;  . CHOLECYSTECTOMY    . ENDOMETRIAL ABLATION  01/11/2008   HER OPTION  . ESOPHAGOGASTRODUODENOSCOPY    . MULTIPLE TOOTH EXTRACTIONS    . PORT-A-CATH REMOVAL Left 05/08/2019   Procedure: REMOVAL PORT-A-CATH;  Surgeon: Stark Klein, MD;  Location: [facility];  Service: General;  Laterality: Left;  . PORTACATH PLACEMENT Left 02/23/2018   Procedure: INSERTION PORT-A-CATH;  Surgeon: Stark Klein, MD;  Location: [facility];  Service: General;  Laterality: Left;  . PORTACATH PLACEMENT N/A 04/21/2018   Procedure: PORT-A-CATH REVISION;  Surgeon: Stark Klein, MD;  Location: [facility];  Service: General;  Laterality: N/A;  . TUBAL LIGATION    . WISDOM TOOTH  EXTRACTION      FAMILY HISTORY: Family History  Problem Relation Age of Onset  . Prostate cancer Other   . Heart disease Other   . Stroke Other   . Diabetes Mother   . Hypertension Mother   . Diabetes Father   . Cancer Father        COLON and LU NG  . Colon cancer Father   . Stroke Paternal Uncle   . Breast cancer Paternal Aunt   . Lung cancer Maternal Grandmother 37       Mesothelioma  . Colon cancer Paternal Grandfather        dx over 42s  . Lung cancer Paternal Aunt   . Breast cancer Paternal Aunt   . Breast cancer Paternal Aunt   . Heart attack Maternal Grandfather 71  . Colon cancer Other        MGM's 5 brothers   The patient's father died at age 57 due to metastatic liver cancer. The patient's mother is alive at 29. The patient's has 1 brother and 3 sisters. There was a paternal grandfather with colon cancer. There were 3 paternal aunts with breast cancer, 2 diagnosed in the 62's and 1 at age 34. There was a maternal grandmother with mesothelioma. The patient otherwise  denies a history of ovarian cancer in the family.    GYNECOLOGIC HISTORY:  Patient's last menstrual period was 06/21/2010. Menarche: 49 years old Age at first live birth: 49 years old She is GXP5. Her LMP was December 2011. She is status post partial hysterectomy without oophorectomy. She took oral contraception for 3 years with no complications. She never took HRT.    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  At home are 2 of her sons, Glendell Docker and [name], and the patient's grandson, Amador Cunas 35 (who is Willie's son). The patient's oldest is [place] age 51 who lives in [place] as a cook. Daughter, [place] age 11 works as a Scientist, water quality. Son, Glendell Docker age 41 is disabled. Son, [name] age 47 lives in [place] as a Microbiologist. Daughter, Benard Rink age 74 also is a Microbiologist. The patient has 5 grandchildren and no great grandchildren. The patient does not belong to a church.    ADVANCED DIRECTIVES: Not in  place; at the 01/18/2018 visit the patient was given the appropriate documents to complete on notarized at her discretion   HEALTH MAINTENANCE: Social History   Tobacco Use  . Smoking status: Never Smoker  . Smokeless tobacco: Never Used  Substance Use Topics  . Alcohol use: Yes    Alcohol/week: 0.0 standard drinks    Comment: social  . Drug use: No     Colonoscopy: Not yet  PAP: November 2018  Bone density: Never   Allergies  Allergen Reactions  . Metformin And Related Other (See Comments)    Bowel frequency  . Levaquin [Levofloxacin In D5w] Nausea And Vomiting and Rash  . Penicillins Swelling and Rash    Has patient had a PCN reaction causing immediate rash, facial/tongue/throat swelling, SOB or lightheadedness with hypotension: Yes Has patient had a PCN reaction causing severe rash involving mucus membranes or skin necrosis: No Has patient had a PCN reaction that required hospitalization: Yes Has patient had a PCN reaction occurring within the last 10 years: No If all of the above answers are "NO", then may proceed with Cephalosporin use.    Current Outpatient Medications  Medication Sig Dispense Refill  . albuterol (PROVENTIL HFA;VENTOLIN HFA) 108 (90 Base) MCG/ACT inhaler Inhale 2 puffs into the lungs every 6 (six) hours as needed for wheezing or shortness of breath.    . bictegravir-emtricitabine-tenofovir AF (BIKTARVY) 50-200-25 MG TABS tablet Take 1 tablet by mouth daily.    . blood glucose meter kit and supplies Dispense based on patient and insurance preference. Use up to four times daily as directed. (FOR ICD-10 E10.9, E11.9). 1 each 0  . carvedilol (COREG) 6.25 MG tablet Take 1 tablet (6.25 mg total) by mouth 2 (two) times daily. 60 tablet 11  . Dulaglutide (TRULICITY) 3 YP/9.5KD SOPN Inject 3 mg into the skin once a week. 6 mL 3  . empagliflozin (JARDIANCE) 25 MG TABS tablet Take 25 mg by mouth daily. (Patient not taking: Reported on 10/03/2019) 90 tablet 3  .  ferrous gluconate (IRON 27) 240 (27 FE) MG tablet Take 240 mg by mouth daily as needed (iron).     . gabapentin (NEURONTIN) 300 MG capsule Take 1 capsule (300 mg total) by mouth at bedtime. (Patient not taking: Reported on 10/03/2019) 90 capsule 4  . glipiZIDE (GLUCOTROL XL) 10 MG 24 hr tablet Take 1 tablet (10 mg total) by mouth daily with breakfast. 90 tablet 3  . hydrocortisone (ANUSOL-HC) 2.5 % rectal cream Place 1 application rectally  2 (two) times daily. (Patient not taking: Reported on 10/03/2019) 30 g 1  . hydrocortisone (ANUSOL-HC) 25 MG suppository Place 1 suppository (25 mg total) rectally every 12 (twelve) hours. 12 suppository 1  . ibuprofen (ADVIL,MOTRIN) 800 MG tablet Take 1 tablet (800 mg total) by mouth every 8 (eight) hours as needed. 60 tablet 0  . Lancets (ONETOUCH DELICA PLUS [id number]) MISC USE TO TEST BLOOD SUGAR UP TO 4 TIMES DAILY 100 each 5  . loratadine (CLARITIN) 10 MG tablet Take 10 mg by mouth daily.    Marland Kitchen LORazepam (ATIVAN) 0.5 MG tablet Take 1 tablet (0.5 mg total) by mouth at bedtime as needed (Nausea or vomiting). (Patient not taking: Reported on 10/03/2019) 30 tablet 0  . losartan (COZAAR) 25 MG tablet Take 1 tablet (25 mg total) by mouth daily. 30 tablet 11  . Multiple Vitamin (MULTIVITAMIN WITH MINERALS) TABS tablet Take 1 tablet by mouth daily.    Marland Kitchen omeprazole (PRILOSEC) 40 MG capsule Take 1 capsule (40 mg total) by mouth daily. 30 capsule 0  . ONETOUCH VERIO test strip USE 1 STRIP TO CHECK GLUCOSE 4 TIMES DAILY AS DIRECTED 100 each 5  . pioglitazone (ACTOS) 45 MG tablet Take 1 tablet (45 mg total) by mouth daily. 90 tablet 3  . rosuvastatin (CRESTOR) 10 MG tablet Take 1 tablet (10 mg total) by mouth daily. 90 tablet 3  . tamoxifen (NOLVADEX) 20 MG tablet Take 1 tablet (20 mg total) by mouth daily. 90 tablet 4  . valACYclovir (VALTREX) 500 MG tablet Take twice daily for 3-5 days 30 tablet 12  . venlafaxine XR (EFFEXOR-XR) 75 MG 24 hr capsule Take 1 capsule (75 mg  total) by mouth daily with breakfast. 90 capsule 4  . Vitamin D, Ergocalciferol, (DRISDOL) 1.25 MG (50000 UNIT) CAPS capsule Take 1 capsule (50,000 Units total) by mouth every 7 (seven) days. 12 capsule 0  . zolpidem (AMBIEN) 10 MG tablet TAKE 1 TABLET BY MOUTH AT BEDTIME AS NEEDED FOR SLEEP 45 tablet 1   No current facility-administered medications for this visit.   Facility-Administered Medications Ordered in Other Visits  Medication Dose Route Frequency Provider Last Rate Last Admin  . sodium chloride flush (NS) 0.9 % injection 10 mL  10 mL Intracatheter PRN [name], [name], MD   10 mL at 09/04/18 1425    OBJECTIVE: Morbidly obese African-American woman in no acute distress  Vitals:   10/11/19 1119  BP: 127/79  Pulse: 86  Resp: 19  Temp: 98.2 F (36.8 C)  SpO2: 98%     Body mass index is 51 kg/m.   Wt Readings from Last 3 Encounters:  10/11/19 269 lb 14.4 oz (122.4 kg)  10/03/19 266 lb (120.7 kg)  06/20/19 260 lb (117.9 kg)  ECOG FS: 2 - Symptomatic, <50% confined to bed  Sclerae unicteric, EOMs intact Wearing a mask No cervical or supraclavicular adenopathy Lungs no rales or rhonchi Heart regular rate and rhythm Abd soft, nontender, positive bowel sounds MSK no focal spinal tenderness, minimal right upper extremity lymphedema without erythema Neuro: nonfocal, well oriented, appropriate affect Breasts: The right breast has undergone lumpectomy followed by radiation.  The cosmetic result is good.  There is some skin coarsening and some hyperpigmentation.  There is no evidence of local recurrence.  The left breast is benign.  Both axillae are benign.   LAB RESULTS:  CMP     Component Value Date/Time   NA 139 10/11/2019 1054   K 4.2 10/11/2019 1054  CL 103 10/11/2019 1054   CO2 27 10/11/2019 1054   GLUCOSE 225 (H) 10/11/2019 1054   BUN 9 10/11/2019 1054   CREATININE 0.86 10/11/2019 1054   CALCIUM 8.6 (L) 10/11/2019 1054   PROT 6.9 10/11/2019 1054   ALBUMIN  3.5 10/11/2019 1054   AST 20 10/11/2019 1054   ALT 20 10/11/2019 1054   ALKPHOS 100 10/11/2019 1054   BILITOT 0.3 10/11/2019 1054   GFRNONAA >60 10/11/2019 1054   GFRAA >60 10/11/2019 1054    No results found for: TOTALPROTELP, ALBUMINELP, A1GS, A2GS, BETS, BETA2SER, GAMS, MSPIKE, SPEI  No results found for: KPAFRELGTCHN, LAMBDASER, KAPLAMBRATIO  Lab Results  Component Value Date   WBC 2.6 (L) 10/11/2019   NEUTROABS 1.4 (L) 10/11/2019   HGB 12.2 10/11/2019   HCT 38.2 10/11/2019   MCV 88.8 10/11/2019   PLT 203 10/11/2019   No results found for: LABCA2  No components found for: CXKGYJ856  No results for input(s): INR in the last 168 hours.  No results found for: LABCA2  No results found for: DJS970  No results found for: YOV785  No results found for: YIF027  No results found for: CA2729  No components found for: HGQUANT  No results found for: CEA1 / No results found for: CEA1   No results found for: AFPTUMOR  No results found for: CHROMOGRNA  No results found for: HGBA, HGBA2QUANT, HGBFQUANT, HGBSQUAN (Hemoglobinopathy evaluation)   No results found for: LDH  Lab Results  Component Value Date   IRON 75 10/03/2019   IRONPCTSAT 20.9 10/03/2019   (Iron and TIBC)  No results found for: FERRITIN  Urinalysis    Component Value Date/Time   COLORURINE YELLOW 10/03/2019 1426   APPEARANCEUR CLEAR 10/03/2019 1426   LABSPEC >=1.030 (A) 10/03/2019 1426   PHURINE 5.0 10/03/2019 1426   GLUCOSEU NEGATIVE 10/03/2019 1426   HGBUR NEGATIVE 10/03/2019 1426   BILIRUBINUR NEGATIVE 10/03/2019 1426   KETONESUR TRACE (A) 10/03/2019 1426   PROTEINUR 1+ (A) 11/06/2018 1121   UROBILINOGEN 0.2 10/03/2019 1426   NITRITE NEGATIVE 10/03/2019 1426   LEUKOCYTESUR NEGATIVE 10/03/2019 1426    STUDIES: No results found.   ELIGIBLE FOR AVAILABLE RESEARCH PROTOCOL: no   ASSESSMENT: 49 y.o. [place], [name] woman status post right breast upper outer quadrant biopsy 01/11/2018  for a clinical T1 N0, stage IA invasive ductal carcinoma, grade 3, estrogen and progesterone receptor positive, HER-2 amplified, with an MIB-1 of 30%.  (1) status post right lumpectomy and sentinel lymph node sampling 02/23/2018 for a pT2 pN1, stage IB invasive ductal carcinoma, grade 3, with close but negative margins  (2) adjuvant chemotherapy consisting of carboplatin, docetaxel, trastuzumab and Pertuzumab every 21 days x 6 given between 03/08/2018-07/05/2018 (a) pertuzumab stopped after cycle 1 due to diarrhea (b) docetaxel changed to gemcitabine after cycle 2 due to persistent hyperglycemia (c) Gemcitabine and Carboplatin dose reduced by approximately 10% due to delayed neutropenia and thrombocytopenia  (3) anti-HER-2 immunotherapy started concurrently with chemotherapy (a) baseline echocardiogram on 02/07/2018 showed an ejection fraction in the 55-60% range (b) repeat echo 06/19/2018 showed an ejection fraction of 55-60% (c) repeat echocardiogram 11/14/2018 showed an EF in the 50-55% range, with normal strain. (d) echo 01/31/2019 shows an ejection fraction in the 55/60% range (e) final trastuzumab dose 03/12/2019  (4) adjuvant radiation 07/27/2018 - 09/06/2018  Site/dose: 1. Right Breast / 50 Gy in 25 fractions    2. Right Supraclavicular nodes / 50 Gy in 25 fractions    3. Right Breast  Boost / 10 Gy in 5 fractions  (5) tamoxifen started November 2020  (6) genetics testing through [facility] Multi-cancer and Breast panel on 01/13/2018 showed: no deleterious mutations. The following genes were evaluated for sequence changes and exonic deletions/duplications: ALK, APC, ATM, AXIN2, BAP1, BARD1, BLM, BMPR1A, BRCA1, BRCA2, BRIP1, CASR, CDC73, CDH1, CDK4, CDKN1B, CDKN1C,CDKN2A (p14ARF), CDKN2A (p16INK4a), CEBPA, CHEK2, CTNNA1, DICER1, DIS3L2, EPCAM*, FH, FLCN, GATA2, GPC3, GREM1*, HRAS, KIT, MAX, MEN1, MET, MLH1, MSH2, MSH3, MSH6, MUTYH, NBN, NF1, NF2, PALB2, PDGFRA, PHOX2B*, PMS2, POLD1,POLE,  POT1, PRKAR1A, PTCH1, PTEN, RAD50, RAD51C, RAD51D, RB1, RECQL4, RET, RUNX1, SDHAF2, SDHB, SDHC, SDHD,SMAD4, SMARCA4, SMARCB1, SMARCE1, STK11, SUFU, TERC, TERT, TMEM127, TP53, TSC1, TSC2, VHL, WRN*, WT1.The following genes were evaluated for sequence changes only: EGFR*, HOXB13*, MITF*, NTHL1*, SDHA   PLAN: Kelly Rowe is now a year and a half out from definitive surgery for her breast cancer with no evidence of disease recurrence.  This is very favorable.  She is tolerating tamoxifen moderately well but hot flashes are very bothersome to her.  I am increasing the venlafaxine to 75 mg daily.  I also refilled her gabapentin prescription and urged her to take it every night at bedtime.  That will also help her chronic insomnia.  I commended her exercise program, encouraged her to increase it, and also encouraged her to receive the COVID-19 vaccine as soon as it becomes available to her  She will see [place] again in 6 months and 12 months.  After the 71-monthvisit I will start seeing her on a once a year basis  Total encounter time 25 minutes.*  [name], GVirgie Dad MD  10/11/19 12:25 PM [facility]  [id number]. [phone]   Fax. [phone]  I, KWilburn Mylar am acting as scribe for Dr. GVirgie Dad [name].  I, GLurline DelMD, have reviewed the above documentation for accuracy and completeness, and I agree with the above.    *Total Encounter Time as defined by the Centers for Medicare and Medicaid Services includes, in addition to the face-to-face time of a patient visit (documented in the note above) non-face-to-face time: obtaining and reviewing outside history, ordering and reviewing medications, tests or procedures, care coordination (communications with other health care professionals or caregivers) and documentation in the medical record.

## 2019-10-11 ENCOUNTER — Inpatient Hospital Stay: Payer: BC Managed Care – PPO

## 2019-10-11 ENCOUNTER — Inpatient Hospital Stay: Payer: BC Managed Care – PPO | Attending: Oncology | Admitting: Oncology

## 2019-10-11 ENCOUNTER — Other Ambulatory Visit: Payer: Self-pay

## 2019-10-11 VITALS — BP 127/79 | HR 86 | Temp 98.2°F | Resp 19 | Ht 61.0 in | Wt 269.9 lb

## 2019-10-11 DIAGNOSIS — Z170 Estrogen receptor positive status [ER+]: Secondary | ICD-10-CM | POA: Insufficient documentation

## 2019-10-11 DIAGNOSIS — C50411 Malignant neoplasm of upper-outer quadrant of right female breast: Secondary | ICD-10-CM

## 2019-10-11 DIAGNOSIS — Z803 Family history of malignant neoplasm of breast: Secondary | ICD-10-CM | POA: Insufficient documentation

## 2019-10-11 DIAGNOSIS — Z923 Personal history of irradiation: Secondary | ICD-10-CM | POA: Diagnosis not present

## 2019-10-11 DIAGNOSIS — I890 Lymphedema, not elsewhere classified: Secondary | ICD-10-CM | POA: Diagnosis not present

## 2019-10-11 DIAGNOSIS — Z8042 Family history of malignant neoplasm of prostate: Secondary | ICD-10-CM | POA: Insufficient documentation

## 2019-10-11 DIAGNOSIS — Z801 Family history of malignant neoplasm of trachea, bronchus and lung: Secondary | ICD-10-CM | POA: Diagnosis not present

## 2019-10-11 DIAGNOSIS — Z9221 Personal history of antineoplastic chemotherapy: Secondary | ICD-10-CM | POA: Diagnosis not present

## 2019-10-11 DIAGNOSIS — C50911 Malignant neoplasm of unspecified site of right female breast: Secondary | ICD-10-CM

## 2019-10-11 DIAGNOSIS — Z79810 Long term (current) use of selective estrogen receptor modulators (SERMs): Secondary | ICD-10-CM | POA: Diagnosis not present

## 2019-10-11 DIAGNOSIS — R232 Flushing: Secondary | ICD-10-CM | POA: Diagnosis not present

## 2019-10-11 DIAGNOSIS — E1169 Type 2 diabetes mellitus with other specified complication: Secondary | ICD-10-CM

## 2019-10-11 DIAGNOSIS — Z800 Family history of malignant neoplasm of digestive organs: Secondary | ICD-10-CM | POA: Diagnosis not present

## 2019-10-11 DIAGNOSIS — F5104 Psychophysiologic insomnia: Secondary | ICD-10-CM | POA: Diagnosis not present

## 2019-10-11 DIAGNOSIS — C773 Secondary and unspecified malignant neoplasm of axilla and upper limb lymph nodes: Secondary | ICD-10-CM

## 2019-10-11 LAB — CBC WITH DIFFERENTIAL (CANCER CENTER ONLY)
Abs Immature Granulocytes: 0.02 10*3/uL (ref 0.00–0.07)
Basophils Absolute: 0 10*3/uL (ref 0.0–0.1)
Basophils Relative: 0 %
Eosinophils Absolute: 0 10*3/uL (ref 0.0–0.5)
Eosinophils Relative: 0 %
HCT: 38.2 % (ref 36.0–46.0)
Hemoglobin: 12.2 g/dL (ref 12.0–15.0)
Immature Granulocytes: 1 %
Lymphocytes Relative: 35 %
Lymphs Abs: 0.9 10*3/uL (ref 0.7–4.0)
MCH: 28.4 pg (ref 26.0–34.0)
MCHC: 31.9 g/dL (ref 30.0–36.0)
MCV: 88.8 fL (ref 80.0–100.0)
Monocytes Absolute: 0.3 10*3/uL (ref 0.1–1.0)
Monocytes Relative: 10 %
Neutro Abs: 1.4 10*3/uL — ABNORMAL LOW (ref 1.7–7.7)
Neutrophils Relative %: 54 %
Platelet Count: 203 10*3/uL (ref 150–400)
RBC: 4.3 MIL/uL (ref 3.87–5.11)
RDW: 14.4 % (ref 11.5–15.5)
WBC Count: 2.6 10*3/uL — ABNORMAL LOW (ref 4.0–10.5)
nRBC: 0 % (ref 0.0–0.2)

## 2019-10-11 LAB — CMP (CANCER CENTER ONLY)
ALT: 20 U/L (ref 0–44)
AST: 20 U/L (ref 15–41)
Albumin: 3.5 g/dL (ref 3.5–5.0)
Alkaline Phosphatase: 100 U/L (ref 38–126)
Anion gap: 9 (ref 5–15)
BUN: 9 mg/dL (ref 6–20)
CO2: 27 mmol/L (ref 22–32)
Calcium: 8.6 mg/dL — ABNORMAL LOW (ref 8.9–10.3)
Chloride: 103 mmol/L (ref 98–111)
Creatinine: 0.86 mg/dL (ref 0.44–1.00)
GFR, Est AFR Am: >60 mL/min (ref >60)
GFR, Estimated: >60 mL/min (ref >60)
Glucose, Bld: 225 mg/dL — ABNORMAL HIGH (ref 70–99)
Potassium: 4.2 mmol/L (ref 3.5–5.1)
Sodium: 139 mmol/L (ref 135–145)
Total Bilirubin: 0.3 mg/dL (ref 0.3–1.2)
Total Protein: 6.9 g/dL (ref 6.5–8.1)

## 2019-10-11 MED ORDER — TAMOXIFEN CITRATE 20 MG PO TABS: 20.0000 mg | ORAL_TABLET | Freq: Every day | ORAL | 4 refills | Status: DC

## 2019-10-11 MED ORDER — VENLAFAXINE HCL ER 75 MG PO CP24: 37.5000 mg | ORAL_CAPSULE | Freq: Every day | ORAL | 4 refills | Status: DC

## 2019-11-06 ENCOUNTER — Encounter: Payer: Self-pay | Admitting: Family

## 2019-11-06 ENCOUNTER — Ambulatory Visit (HOSPITAL_COMMUNITY)
Admission: RE | Admit: 2019-11-06 | Discharge: 2019-11-06 | Disposition: A | Discharge status: Home / Self Care | Payer: BC Managed Care – PPO | Source: Ambulatory Visit | Attending: Family | Admitting: Family

## 2019-11-06 ENCOUNTER — Other Ambulatory Visit: Payer: Self-pay | Admitting: Family

## 2019-11-06 ENCOUNTER — Ambulatory Visit (INDEPENDENT_AMBULATORY_CARE_PROVIDER_SITE_OTHER): Payer: BC Managed Care – PPO | Admitting: Family

## 2019-11-06 ENCOUNTER — Encounter (HOSPITAL_COMMUNITY): Payer: Self-pay

## 2019-11-06 ENCOUNTER — Ambulatory Visit (INDEPENDENT_AMBULATORY_CARE_PROVIDER_SITE_OTHER): Payer: BC Managed Care – PPO

## 2019-11-06 ENCOUNTER — Other Ambulatory Visit: Payer: Self-pay

## 2019-11-06 VITALS — BP 124/72 | HR 103 | Temp 98.3°F | Ht 61.0 in | Wt 265.6 lb

## 2019-11-06 DIAGNOSIS — M79662 Pain in left lower leg: Secondary | ICD-10-CM

## 2019-11-06 DIAGNOSIS — Z853 Personal history of malignant neoplasm of breast: Secondary | ICD-10-CM | POA: Diagnosis not present

## 2019-11-06 DIAGNOSIS — M25562 Pain in left knee: Secondary | ICD-10-CM | POA: Diagnosis not present

## 2019-11-06 NOTE — Progress Notes (Signed)
Kelly Rowe is a 49 y.o. female with the following history as recorded in EpicCare:  Patient Active Problem List   Diagnosis Date Noted  . Lymphedema of right arm 10/11/2019  . Upper respiratory infection 07/02/2019  . Type 2 diabetes mellitus with other specified complication ([place]) 70/96/2836  . Hematochezia 03/30/2018  . Insomnia 03/30/2018  . Port-A-Cath in place 03/29/2018  . Genetic testing 01/30/2018  . Neoplasm of breast, regional lymph node staging category N3b: metastasis in ipsilateral internal mammary lymph node and axillary lymph node ([place]) 01/30/2018  . Family history of breast cancer   . Family history of colon cancer   . Family history of lung cancer   . Diabetes mellitus type 2 without retinopathy ([place]) 01/18/2018  . Morbid obesity ([place]) 01/18/2018  . Malignant neoplasm of upper-outer quadrant of right breast in female, estrogen receptor positive ([place]) 01/17/2018  . Achilles tendinitis 11/28/2017  . Hot flashes 09/30/2017  . Hypertension 09/08/2017  . Contusion of left knee and lower leg 11/18/2015  . Mild intermittent asthma 08/15/2013  . BV (bacterial vaginosis) 08/11/2012  . Right knee pain 07/05/2012  . GERD (gastroesophageal reflux disease) 07/05/2012  . GC (gonococcus) 05/30/2012  . Chlamydia 05/30/2012  . Left knee pain 01/12/2012  . Cough 11/01/2011  . Encounter for long-term (current) use of high-risk medication 02/25/2011  . Preventative health care 02/14/2011  . VITAMIN D DEFICIENCY 01/30/2010  . ANEMIA-IRON DEFICIENCY 01/30/2010  . Edema 01/30/2010  . HIV (human immunodeficiency virus infection) ([place]) 03/12/2009  . GENITAL HERPES 03/12/2009  . Anxiety state 11/27/2007  . 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 Hyperlipidemia 08/02/2007    Current Outpatient Medications  Medication Sig Dispense Refill  .  albuterol (PROVENTIL HFA;VENTOLIN HFA) 108 (90 Base) MCG/ACT inhaler Inhale 2 puffs into the lungs every 6 (six) hours as needed for wheezing or shortness of breath.    . bictegravir-emtricitabine-tenofovir AF (BIKTARVY) 50-200-25 MG TABS tablet Take 1 tablet by mouth daily.    . blood glucose meter kit and supplies Dispense based on patient and insurance preference. Use up to four times daily as directed. (FOR ICD-10 E10.9, E11.9). 1 each 0  . carvedilol (COREG) 6.25 MG tablet Take 1 tablet (6.25 mg total) by mouth 2 (two) times daily. 60 tablet 11  . Dulaglutide (TRULICITY) 3 OQ/9.4TM SOPN Inject 3 mg into the skin once a week. 6 mL 3  . ferrous gluconate (IRON 27) 240 (27 FE) MG tablet Take 240 mg by mouth daily as needed (iron).     . gabapentin (NEURONTIN) 300 MG capsule Take 1 capsule (300 mg total) by mouth at bedtime. 90 capsule 4  . glipiZIDE (GLUCOTROL XL) 10 MG 24 hr tablet Take 1 tablet (10 mg total) by mouth daily with breakfast. 90 tablet 3  . hydrocortisone (ANUSOL-HC) 2.5 % rectal cream Place 1 application rectally 2 (two) times daily. 30 g 1  . hydrocortisone (ANUSOL-HC) 25 MG suppository Place 1 suppository (25 mg total) rectally every 12 (twelve) hours. 12 suppository 1  . ibuprofen (ADVIL,MOTRIN) 800 MG tablet Take 1 tablet (800 mg total) by mouth every 8 (eight) hours as needed. 60 tablet 0  . Lancets (ONETOUCH DELICA PLUS [id number]) MISC USE TO TEST BLOOD SUGAR UP TO 4 TIMES DAILY 100 each 5  . loratadine (CLARITIN) 10 MG tablet Take 10 mg by mouth daily.    Marland Kitchen LORazepam (  ATIVAN) 0.5 MG tablet Take 1 tablet (0.5 mg total) by mouth at bedtime as needed (Nausea or vomiting). 30 tablet 0  . losartan (COZAAR) 25 MG tablet Take 1 tablet (25 mg total) by mouth daily. 30 tablet 11  . Multiple Vitamin (MULTIVITAMIN WITH MINERALS) TABS tablet Take 1 tablet by mouth daily.    Marland Kitchen omeprazole (PRILOSEC) 40 MG capsule Take 1 capsule (40 mg total) by mouth daily. 30 capsule 0  . ONETOUCH VERIO  test strip USE 1 STRIP TO CHECK GLUCOSE 4 TIMES DAILY AS DIRECTED 100 each 5  . pioglitazone (ACTOS) 45 MG tablet Take 1 tablet (45 mg total) by mouth daily. 90 tablet 3  . rosuvastatin (CRESTOR) 10 MG tablet Take 1 tablet (10 mg total) by mouth daily. 90 tablet 3  . tamoxifen (NOLVADEX) 20 MG tablet Take 1 tablet (20 mg total) by mouth daily. 90 tablet 4  . valACYclovir (VALTREX) 500 MG tablet Take twice daily for 3-5 days 30 tablet 12  . venlafaxine XR (EFFEXOR-XR) 75 MG 24 hr capsule Take 1 capsule (75 mg total) by mouth daily with breakfast. 90 capsule 4  . Vitamin D, Ergocalciferol, (DRISDOL) 1.25 MG (50000 UNIT) CAPS capsule Take 1 capsule (50,000 Units total) by mouth every 7 (seven) days. 12 capsule 0  . zolpidem (AMBIEN) 10 MG tablet TAKE 1 TABLET BY MOUTH AT BEDTIME AS NEEDED FOR SLEEP 45 tablet 1  . empagliflozin (JARDIANCE) 25 MG TABS tablet Take 25 mg by mouth daily. (Patient not taking: Reported on 10/03/2019) 90 tablet 3   No current facility-administered medications for this visit.   Facility-Administered Medications Ordered in Other Visits  Medication Dose Route Frequency Provider Last Rate Last Admin  . sodium chloride flush (NS) 0.9 % injection 10 mL  10 mL Intracatheter PRN [name], [name], MD   10 mL at 09/04/18 1425    Allergies: Metformin and related, Levaquin [levofloxacin in d5w], and Penicillins  Past Medical History:  Diagnosis Date  . ANEMIA-IRON DEFICIENCY 01/30/2010  . ANXIETY 11/27/2007  . Arthritis   . Asthma 02/25/2011  . Cancer ([place])    breast  . Carbuncle and furuncle of trunk 04/16/2010  . Chlamydia infection 03/21/2008  . DIABETES MELLITUS, TYPE II 08/02/2007  . Edema 01/30/2010  . ELEVATED BLOOD PRESSURE WITHOUT DIAGNOSIS OF HYPERTENSION 08/02/2007  . Family history of breast cancer   . Family history of colon cancer   . Family history of lung cancer   . FREQUENCY, URINARY 05/19/2009  . GENITAL HERPES 03/12/2009  . GERD (gastroesophageal reflux disease)    . History of kidney stones   . History of radiation therapy 07/27/18- 09/06/18   Right Breast, Right Axillary and Supraclavicular nodes 50 Gy in 25 fractions, Right Breast Boost 10 Gy in 5 fractions  . HIV (human immunodeficiency virus infection) ([place]) 2009  . HIV INFECTION 03/12/2009  . HSV (herpes simplex virus) infection   . HYPERLIPIDEMIA 08/02/2007  . Hypertension   . Metrorrhagia 03/21/2008  . PONV (postoperative nausea and vomiting)    woke up crying per patient   . RETENTION, URINE 05/19/2009  . Sleep apnea   . Trichomonas infection 01/19/2010  . VITAMIN D DEFICIENCY 01/30/2010    Past Surgical History:  Procedure Laterality Date  . ABDOMINAL HYSTERECTOMY  07/22/2010   TAH WITH PRESERVATION OF BOTH TUBES AND OVARIES  . BREAST LUMPECTOMY Right 2019  . BREAST LUMPECTOMY WITH RADIOACTIVE SEED AND SENTINEL LYMPH NODE BIOPSY Right 02/23/2018   Procedure: BREAST LUMPECTOMY  WITH RADIOACTIVE SEED AND SENTINEL LYMPH NODE BIOPSY;  Surgeon: Stark Klein, MD;  Location: [facility];  Service: General;  Laterality: Right;  . CHOLECYSTECTOMY    . ENDOMETRIAL ABLATION  01/11/2008   HER OPTION  . ESOPHAGOGASTRODUODENOSCOPY    . MULTIPLE TOOTH EXTRACTIONS    . PORT-A-CATH REMOVAL Left 05/08/2019   Procedure: REMOVAL PORT-A-CATH;  Surgeon: Stark Klein, MD;  Location: [facility];  Service: General;  Laterality: Left;  . PORTACATH PLACEMENT Left 02/23/2018   Procedure: INSERTION PORT-A-CATH;  Surgeon: Stark Klein, MD;  Location: [facility];  Service: General;  Laterality: Left;  . PORTACATH PLACEMENT N/A 04/21/2018   Procedure: PORT-A-CATH REVISION;  Surgeon: Stark Klein, MD;  Location[facility];  Service: General;  Laterality: N/A;  . TUBAL LIGATION    . WISDOM TOOTH EXTRACTION      Family History  Problem Relation Age of Onset  . Prostate cancer Other   . Heart disease Other   . Stroke Other   . Diabetes Mother   . Hypertension Mother   . Diabetes Father   . Cancer Father         COLON and LU NG  . Colon cancer Father   . Stroke Paternal Uncle   . Breast cancer Paternal Aunt   . Lung cancer Maternal Grandmother 74       Mesothelioma  . Colon cancer Paternal Grandfather        dx over 55s  . Lung cancer Paternal Aunt   . Breast cancer Paternal Aunt   . Breast cancer Paternal Aunt   . Heart attack Maternal Grandfather 71  . Colon cancer Other        MGM's 5 brothers    Social History   Tobacco Use  . Smoking status: Never Smoker  . Smokeless tobacco: Never Used  Substance Use Topics  . Alcohol use: Yes    Alcohol/week: 0.0 standard drinks    Comment: social    Subjective:  Complaining of pain in left lower leg x 1 week; pain started in left calf suddenly last Friday; now feels pain has spread to involve her left knee; no known injury or trauma but is trying to exercise at the gym more regularly recently; is being treated for breast cancer; no cough or shortness of breath;    Objective:  Vitals:   11/06/19 1204  BP: 124/72  Pulse: (!) 103  Temp: 98.3 F (36.8 C)  TempSrc: Oral  SpO2: 99%  Weight: 265 lb 9.6 oz (120.5 kg)  Height: 5' 1"  (1.549 m)    General: Well developed, well nourished, in no acute distress  Skin : Warm and dry.  Head: Normocephalic and atraumatic  Lungs: Respirations unlabored; clear to auscultation bilaterally without wheeze, rales, rhonchi  Musculoskeletal: No deformities; no active joint inflammation  Extremities: No edema, cyanosis, clubbing  Vessels: Symmetric bilaterally  Neurologic: Alert and oriented; speech intact; face symmetrical; moves all extremities well; CNII-XII intact without focal deficit   Assessment:  1. Pain of left calf   2. Acute pain of left knee     Plan:  Suspect muscular source but due to history of breast cancer, will rule out DVT; doppler is scheduled for later today at 1:00 pm; will also update left knee X-ray; continue to elevate, rest and use Tylenol; follow up to be determined.  This  visit occurred during the SARS-CoV-2 public health emergency.  Safety protocols were in place, including screening questions prior to the visit, additional  usage of staff PPE, and extensive cleaning of exam room while observing appropriate contact time as indicated for disinfecting solutions.      No follow-ups on file.  No orders of the defined types were placed in this encounter.   Requested Prescriptions    No prescriptions requested or ordered in this encounter

## 2019-11-06 NOTE — Progress Notes (Unsigned)
Left lower venous has been completed and is negative for DVT. Preliminary results can be found under CV proc through chart review.  Odyn Turko RVT [facility]  

## 2019-11-19 ENCOUNTER — Encounter (HOSPITAL_BASED_OUTPATIENT_CLINIC_OR_DEPARTMENT_OTHER): Payer: Self-pay

## 2019-11-19 ENCOUNTER — Emergency Department (HOSPITAL_BASED_OUTPATIENT_CLINIC_OR_DEPARTMENT_OTHER)
Admission: EM | Admit: 2019-11-19 | Discharge: 2019-11-19 | Disposition: A | Discharge status: Home / Self Care | Payer: BC Managed Care – PPO | Attending: Emergency Medicine | Admitting: Emergency Medicine

## 2019-11-19 ENCOUNTER — Other Ambulatory Visit: Payer: Self-pay

## 2019-11-19 ENCOUNTER — Emergency Department (HOSPITAL_BASED_OUTPATIENT_CLINIC_OR_DEPARTMENT_OTHER): Payer: BC Managed Care – PPO

## 2019-11-19 DIAGNOSIS — Z853 Personal history of malignant neoplasm of breast: Secondary | ICD-10-CM | POA: Insufficient documentation

## 2019-11-19 DIAGNOSIS — E785 Hyperlipidemia, unspecified: Secondary | ICD-10-CM | POA: Insufficient documentation

## 2019-11-19 DIAGNOSIS — M25462 Effusion, left knee: Secondary | ICD-10-CM | POA: Diagnosis not present

## 2019-11-19 DIAGNOSIS — I10 Essential (primary) hypertension: Secondary | ICD-10-CM | POA: Insufficient documentation

## 2019-11-19 DIAGNOSIS — J45909 Unspecified asthma, uncomplicated: Secondary | ICD-10-CM | POA: Diagnosis not present

## 2019-11-19 DIAGNOSIS — B20 Human immunodeficiency virus [HIV] disease: Secondary | ICD-10-CM | POA: Insufficient documentation

## 2019-11-19 DIAGNOSIS — M25562 Pain in left knee: Secondary | ICD-10-CM | POA: Diagnosis present

## 2019-11-19 DIAGNOSIS — E119 Type 2 diabetes mellitus without complications: Secondary | ICD-10-CM | POA: Insufficient documentation
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
## 2019-11-27 ENCOUNTER — Telehealth: Payer: Self-pay | Admitting: Internal Medicine

## 2019-11-27 NOTE — Telephone Encounter (Signed)
   Patient requesting referral to Ortho, fluid on knee

## 2019-11-27 NOTE — Telephone Encounter (Signed)
Does pt have a preference, b/c if not, please ask if should would simply make an appt with Sports medicine (or I can refer urgently if needed) as they are very good at this

## 2019-11-28 NOTE — Telephone Encounter (Signed)
   Appointment made with Sports Med, Dr Georgina Snell

## 2019-11-29 ENCOUNTER — Encounter: Payer: Self-pay | Admitting: Family Medicine

## 2019-11-29 ENCOUNTER — Ambulatory Visit (INDEPENDENT_AMBULATORY_CARE_PROVIDER_SITE_OTHER): Payer: BC Managed Care – PPO | Admitting: Family Medicine

## 2019-11-29 ENCOUNTER — Ambulatory Visit: Payer: Self-pay

## 2019-11-29 ENCOUNTER — Other Ambulatory Visit: Payer: Self-pay

## 2019-11-29 VITALS — BP 114/82 | HR 101 | Ht 62.0 in | Wt 262.4 lb

## 2019-11-29 DIAGNOSIS — M25462 Effusion, left knee: Secondary | ICD-10-CM

## 2019-11-29 DIAGNOSIS — S8012XA Contusion of left lower leg, initial encounter: Secondary | ICD-10-CM

## 2019-11-29 DIAGNOSIS — M1712 Unilateral primary osteoarthritis, left knee: Secondary | ICD-10-CM

## 2019-11-29 DIAGNOSIS — M25562 Pain in left knee: Secondary | ICD-10-CM

## 2019-11-29 DIAGNOSIS — S8002XA Contusion of left knee, initial encounter: Secondary | ICD-10-CM

## 2019-11-29 DIAGNOSIS — M7042 Prepatellar bursitis, left knee: Secondary | ICD-10-CM

## 2019-11-29 DIAGNOSIS — E1169 Type 2 diabetes mellitus with other specified complication: Secondary | ICD-10-CM | POA: Diagnosis not present

## 2019-11-29 MED ORDER — COLCHICINE 0.6 MG PO TABS
0.6000 mg | ORAL_TABLET | Freq: Every day | ORAL | 2 refills | Status: DC
Start: 2019-11-29 — End: 2020-07-16

## 2019-11-29 MED ORDER — PENNSAID 2 % EX SOLN: 1.0000 "application " | Freq: Two times a day (BID) | CUTANEOUS | 2 refills | Status: AC

## 2019-11-29 NOTE — Patient Instructions (Addendum)
Thank you for coming in today.  Plan for colchicine for knee pain. Take daily.  Use pennsaid or voltaren gel.  Rechekc in 1-2 weeks if not better. Let me know ahead of time so I can arrange insulin if needed.   Keep me updated.   Instructions for Duexis, Pennsaid and Vimovo:  Your prescription will be filled through a participating [facility] mail order pharmacy.  You will receive a phone call or text from one of the participating pharmacies which can be located in any state in the [place].  You must communicate directly with them to have this medication filled.  When the pharmacy contacts you, they will need your mailing address (for shipment of the medication) andy they will need payment information if you have a copay (typically no more than $10). If you have not heard from them 2-3 days after your appointment with Dr. Georgina Snell, contact [facility] directly at [phone].  Pennsaid instructions: You have been given a sample/prescription for Pennsaid, a topical medication.     You are to apply this gel to your injured body part twice daily (morning and evening).   A little goes a long way so you can use about a pea-sized amount for each area.   Spread this small amount over the area into a thin film and let it dry.   Be sure that you do not rub the gel into your skin for more than 10 or 15 seconds otherwise it can irritate you skin.    Once you apply the gel, please do not put any other lotion or clothing in contact with that area for 30 minutes to allow the gel to absorb into your skin.   Some people are sensitive to the medication and can develop a sunburn-like rash.  If you have only mild symptoms it is okay to continue to use the medication but if you have any breakdown of your skin you should discontinue its use and please let us know.   If you have been written a prescription for Pennsaid, you will receive a pump bottle of this topical gel through a mail order  pharmacy.  The instructions on the bottle will say to apply two pumps twice a day which may be too much gel for your particular area so use the pea-sized amount as your guide.
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
## 2019-12-07 ENCOUNTER — Telehealth: Payer: Self-pay | Admitting: Family Medicine
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
## 2019-12-11 ENCOUNTER — Encounter: Payer: Self-pay | Admitting: Family Medicine

## 2019-12-11 NOTE — Telephone Encounter (Signed)
Called pt and informed her that her letter is available for pick-up or she can get it at her f/u visit tomorrow.

## 2019-12-11 NOTE — Telephone Encounter (Signed)
Letter written and is ready for pickup

## 2019-12-12 ENCOUNTER — Encounter: Payer: Self-pay | Admitting: Family Medicine

## 2019-12-12 ENCOUNTER — Ambulatory Visit: Payer: Self-pay

## 2019-12-12 ENCOUNTER — Other Ambulatory Visit: Payer: Self-pay

## 2019-12-12 ENCOUNTER — Ambulatory Visit (INDEPENDENT_AMBULATORY_CARE_PROVIDER_SITE_OTHER): Payer: BC Managed Care – PPO | Admitting: Family Medicine

## 2019-12-12 VITALS — BP 110/80 | HR 100 | Ht 62.0 in | Wt 262.0 lb

## 2019-12-12 DIAGNOSIS — M7042 Prepatellar bursitis, left knee: Secondary | ICD-10-CM

## 2019-12-12 DIAGNOSIS — E1169 Type 2 diabetes mellitus with other specified complication: Secondary | ICD-10-CM

## 2019-12-12 DIAGNOSIS — M1732 Unilateral post-traumatic osteoarthritis, left knee: Secondary | ICD-10-CM | POA: Diagnosis not present

## 2019-12-12 DIAGNOSIS — M25562 Pain in left knee: Secondary | ICD-10-CM | POA: Diagnosis not present

## 2019-12-12 MED ORDER — PEN NEEDLES 32G X 4 MM MISC: 12 refills | Status: AC

## 2019-12-12 MED ORDER — BASAGLAR KWIKPEN 100 UNIT/ML ~~LOC~~ SOPN: 10.0000 [IU] | PEN_INJECTOR | Freq: Every day | SUBCUTANEOUS | 12 refills | Status: DC

## 2019-12-12 NOTE — Progress Notes (Signed)
Rito Ehrlich, am serving as a Education administrator for Dr. [name].  Kelly Rowe is a 49 y.o. female who presents to [place] at [facility] today for f/u of L knee pain.  She was last seen by Dr. Georgina Snell on 11/29/19 and prescribed colchicine and Pennsaid.  A steroid injection into her L knee was deferred due to uncontrolled blood sugar.  Since her last visit, pt reports that she is feeling slightly better but the leg is still swollen and knee still hurts. Leg hurts worse when straightening out because it causes charlie horses, but when sleeping with legs bent the knee hurts. Patient states the pennsaid makes her leg feel tight when she applies it. The pain is now shooting down the front of her leg from her knee to foot.   She notes her blood sugar was 490 this morning.  She notes its not been less than 300 in weeks.  Diagnostic imaging: L knee XR- 11/06/19, 11/19/19  Pertinent review of systems: No fevers or chills  Relevant historical information: Poorly controlled diabetes reluctant to consider insulin.   Exam:  BP 110/80 (BP Location: Left Arm, Patient Position: Sitting, Cuff Size: Large)   Pulse 100   Ht 5\' 2"  (1.575 m)   Wt 262 lb (118.8 kg)   LMP 06/21/2010   SpO2 96%   BMI 47.92 kg/m  General: Well Developed, well nourished, and in no acute distress.   MSK: Left knee no effusion visible on exam today. Range of motion 0-120 degrees. Not particularly tender to palpation. Stable ligamentous exam. Intact strength.    Lab and Radiology Results Diagnostic Limited MSK Ultrasound of: Left knee Quad tendon intact normal-appearing Small to moderate joint effusion present superior patellar space. Patellar tendon intact normal-appearing No significant prepatellar bursitis swelling visible on exam today. Narrowed medial and lateral joint lines. Minuscule Baker's cyst present posterior medial knee. Dilated varicosity vein seen posterior lateral knee.  Vein is  compressible. Impression: Mild to moderate joint effusion.  DJD     Assessment and Plan: 49 y.o. female with left knee pain improving but not resolved yet.  She still having fair amount of discomfort.  The obvious next option would normally be steroid injection but her blood sugar is not controlled. Discussed the case with the patient will proceed with several different options all at once. Plan to continue current regimen.  We will work on authorizing hyaluronic acid injections as these may help and we will not impact blood sugar at all.  Additionally after discussing with PCP we will start patient on insulin.  Start basal insulin at 10 units/day.  Prescribed Basaglar.  Patient will schedule nurse visit with CMA in my office or PCP office to learn how to administer insulin.  Anticipate recheck near future to either give steroid or hyaluronic acid injection.  Insulin will obviously need to be titrated.  Follow-up with PCP.   PDMP not reviewed this encounter. Orders Placed This Encounter  Procedures  . [place] LIMITED JOINT SPACE STRUCTURES LOW LEFT(NO LINKED CHARGES)    Order Specific Question:   Reason for Exam (SYMPTOM  OR DIAGNOSIS REQUIRED)    Answer:   left knee [place]    Order Specific Question:   Preferred imaging location?    Answer:   [facility]   Meds ordered this encounter  Medications  . Insulin Glargine (BASAGLAR KWIKPEN) 100 UNIT/ML    Sig: Inject 0.1 mLs (10 Units total) into the skin daily.  Dispense:  5 pen    Refill:  12  . Insulin Pen Needle (PEN NEEDLES) 32G X 4 MM MISC    Sig: Use with insulin pen    Dispense:  100 each    Refill:  12     Discussed warning signs or symptoms. Please see discharge instructions. Patient expresses understanding.   The above documentation has been reviewed and is accurate and complete [name], M.D.

## 2019-12-12 NOTE — Patient Instructions (Signed)
Thank you for coming in today. I will work on authorizing the gel shots for your knee because it will be safer while your sugars are not controlled.  Plan to start insulin so that your sugars are better controlled which will help your knee and be safer if we need to do a cortisone shot.  This will also help your diabetes.   Watch the [employer] on how to use your pen.  You could also schedule a nurse visit at Dr Gwynn Burly office to teach how to use to pen or schedule with my nurse Denton Ar.   Keep me updated.

## 2019-12-18 ENCOUNTER — Telehealth: Payer: Self-pay | Admitting: Oncology

## 2019-12-18 NOTE — Telephone Encounter (Signed)
Faxed medical records to [facility] @ fax#[phone]

## 2019-12-25 ENCOUNTER — Telehealth (HOSPITAL_COMMUNITY): Payer: Self-pay

## 2019-12-25 NOTE — Telephone Encounter (Signed)
Received a medical records request from [employer] requesting medical records from 08/27/19-present however patient has not been seen. Sent form back stating that patient has not been seen during this time period. Form will be scanned into chart

## 2019-12-27 ENCOUNTER — Telehealth: Payer: Self-pay

## 2019-12-27 ENCOUNTER — Telehealth: Payer: Self-pay | Admitting: Internal Medicine

## 2019-12-27 NOTE — Telephone Encounter (Signed)
New message   [employer] calling   Checking on the status of disability paperwork   Submit on  5.25.21   Please advise   Dr. Jenny Reichmann fax # was given [phone]

## 2019-12-27 NOTE — Telephone Encounter (Signed)
I spoke with [employer] and informed we received a medical records request but no forms to be completed. Also her Oncology doctor completes her disability paperwork.

## 2019-12-27 NOTE — Telephone Encounter (Signed)
We received a medical records request from [employer] requesting records starting 08/27/19 to present. Forms have been sent to Medical records to complete request.

## 2019-12-28 ENCOUNTER — Telehealth: Payer: Self-pay | Admitting: Gastroenterology

## 2019-12-28 NOTE — Telephone Encounter (Signed)
Spoke with company and they wanted to confirm fax number to send form. Fax number [phone] for them to fax the form to attention Dr. Tarri Glenn.

## 2019-12-28 NOTE — Telephone Encounter (Signed)
They are requesting a call back in reference to disability forms.

## 2020-01-07 NOTE — Telephone Encounter (Signed)
I received forms, They have been completed &Placed in providers box to review and sign.

## 2020-01-08 NOTE — Telephone Encounter (Signed)
I have not

## 2020-01-08 NOTE — Telephone Encounter (Signed)
Kelly Rowe from [employer] called inqruing about the status of disability form as they have not yet received them. Pls fax form to either [phone] or [phone].

## 2020-01-08 NOTE — Telephone Encounter (Signed)
Dr. Tarri Glenn have you seen disability forms for this pt?

## 2020-01-09 NOTE — Telephone Encounter (Signed)
Called [employer] and they state they have no auth form to fill out the disability form. [employer] still has the forms. [employer] notified.

## 2020-01-10 NOTE — Telephone Encounter (Signed)
I received some more forms to be completed. Forms have been completed & Signed, Fax as requested, Sent to scan.

## 2020-01-14 ENCOUNTER — Ambulatory Visit (INDEPENDENT_AMBULATORY_CARE_PROVIDER_SITE_OTHER): Payer: BC Managed Care – PPO | Admitting: Family Medicine

## 2020-01-14 ENCOUNTER — Ambulatory Visit: Payer: Self-pay

## 2020-01-14 ENCOUNTER — Other Ambulatory Visit: Payer: Self-pay

## 2020-01-14 VITALS — Ht 62.0 in

## 2020-01-14 DIAGNOSIS — E08319 Diabetes mellitus due to underlying condition with unspecified diabetic retinopathy without macular edema: Secondary | ICD-10-CM

## 2020-01-14 DIAGNOSIS — M25562 Pain in left knee: Secondary | ICD-10-CM

## 2020-01-14 DIAGNOSIS — G8929 Other chronic pain: Secondary | ICD-10-CM

## 2020-01-14 DIAGNOSIS — M1712 Unilateral primary osteoarthritis, left knee: Secondary | ICD-10-CM | POA: Diagnosis not present

## 2020-01-14 NOTE — Progress Notes (Signed)
   I, Wendy Poet, LAT, ATC, am serving as scribe for Dr. [name].  Kelly Rowe is a 49 y.o. female who presents to [place] at [facility] today for f/u of L knee pain and swelling.  She was last seen on 12/12/19 and was placed on insulin to help control her blood sugar to allow knee injection as a treatment option.  She was approved for Synvisc on 12/21/19.  Since her last visit, pt reports her blood sugar has improved considerably.  Her knee still hurts quite a bit.  Diagnostic testing:  L knee XR- 11/19/19  Pertinent review of systems: No fevers or chills  Relevant historical information: Diabetes   Exam:  Ht 5\' 2"  (1.575 m)   LMP 06/21/2010   BMI 47.92 kg/m  General: Well Developed, well nourished, and in no acute distress.   MSK: Left knee mild effusion. Mildly tender palpation medial and lateral joint line. Normal motion with crepitation.    Lab and Radiology Results  Procedure: Real-time Ultrasound Guided Injection of left knee lateral suprapatellar space  Device: Philips Affiniti 50G Images permanently stored and available for review in the ultrasound unit. Verbal informed consent obtained.  Discussed risks and benefits of procedure. Warned about infection bleeding damage to structures skin hypopigmentation and fat atrophy among others. Patient expresses understanding and agreement Time-out conducted.   Noted no overlying erythema, induration, or other signs of local infection.   Skin prepped in a sterile fashion.   Local anesthesia: Topical Ethyl chloride.   With sterile technique and under real time ultrasound guidance:  Synvisc injected easily.   Completed without difficulty    Advised to call if fevers/chills, erythema, induration, drainage, or persistent bleeding.   Images permanently stored and available for review in the ultrasound unit.  Impression: Technically successful ultrasound guided injection.  Synvisc 1/3 left knee.         Assessment and Plan: 49 y.o. female with left knee pain and effusion due to DJD.  Plan for Synvisc series starting today.  Synvisc 1/3 given.  Recheck 1 week for Synvisc 2/3.  As diabetes is better controlled could consider advancing to steroids as needed as well.   PDMP not reviewed this encounter. Orders Placed This Encounter  Procedures  . [place] LIMITED JOINT SPACE STRUCTURES LOW LEFT(NO LINKED CHARGES)    Order Specific Question:   Reason for Exam (SYMPTOM  OR DIAGNOSIS REQUIRED)    Answer:   left knee injection    Order Specific Question:   Preferred imaging location?    Answer:   [place]   No orders of the defined types were placed in this encounter.    Discussed warning signs or symptoms. Please see discharge instructions. Patient expresses understanding.   The above documentation has been reviewed and is accurate and complete [name], M.D.

## 2020-01-14 NOTE — Patient Instructions (Signed)
Thank you for coming in today.  Call or go to the ER if you develop a large red swollen joint with extreme pain or oozing puss.  Recheck weekly for the next 2 weeks for Synvisc injections.

## 2020-01-21 ENCOUNTER — Ambulatory Visit: Payer: BC Managed Care – PPO | Admitting: Family Medicine

## 2020-01-23 ENCOUNTER — Ambulatory Visit (INDEPENDENT_AMBULATORY_CARE_PROVIDER_SITE_OTHER): Payer: BC Managed Care – PPO

## 2020-01-23 ENCOUNTER — Ambulatory Visit: Payer: Self-pay

## 2020-01-23 ENCOUNTER — Encounter: Payer: Self-pay | Admitting: Family Medicine

## 2020-01-23 ENCOUNTER — Other Ambulatory Visit: Payer: Self-pay

## 2020-01-23 ENCOUNTER — Ambulatory Visit (INDEPENDENT_AMBULATORY_CARE_PROVIDER_SITE_OTHER): Payer: BC Managed Care – PPO | Admitting: Family Medicine

## 2020-01-23 VITALS — BP 130/82 | HR 92 | Ht 62.0 in | Wt 262.0 lb

## 2020-01-23 DIAGNOSIS — G8929 Other chronic pain: Secondary | ICD-10-CM | POA: Diagnosis not present

## 2020-01-23 DIAGNOSIS — M1712 Unilateral primary osteoarthritis, left knee: Secondary | ICD-10-CM | POA: Diagnosis not present

## 2020-01-23 DIAGNOSIS — M25472 Effusion, left ankle: Secondary | ICD-10-CM

## 2020-01-23 DIAGNOSIS — M25562 Pain in left knee: Secondary | ICD-10-CM | POA: Diagnosis not present

## 2020-01-23 NOTE — Progress Notes (Signed)
Rito Ehrlich, am serving as a Education administrator for Dr. [name].  Kelly Rowe is a 49 y.o. female who presents to [place] at [facility] today for f/u of L knee pain and Synvisc injection 2/3.  She is also c/o swelling in her foot L foot noticed it Monday. No pain, but is warm to the touch. Patient has been elevating her foot but the swelling has stayed the same. Patient states she got her toes done Saturday and noticed she has a pot that was bleeding on her toe, walked around [place] on Sunday, and then exercised on the sitting elliptical  Monday .   Diagnostic imaging: L knee XR- 11/19/19  Pertinent review of systems: No fevers or chills  Relevant historical information: Hypertension   Exam:  BP 130/82 (BP Location: Left Arm, Patient Position: Sitting, Cuff Size: Large)   Pulse 92   Ht 5\' 2"  (1.575 m)   Wt 262 lb (118.8 kg)   LMP 06/21/2010   SpO2 99%   BMI 47.92 kg/m  General: Well Developed, well nourished, and in no acute distress.   MSK: Left knee small effusion normal-appearing otherwise. Left ankle moderately swollen.  Nontender stable ligamentous exam intact strength. Calf diameters equal bilaterally with no palpable cords.  Nontender.   Lab and Radiology Results  Procedure: Real-time Ultrasound Guided Injection of left knee Synvisc 2/3 Device: Philips Affiniti 50G Images permanently stored and available for review in the ultrasound unit. Verbal informed consent obtained.  Discussed risks and benefits of procedure. Warned about infection bleeding damage to structures skin hypopigmentation and fat atrophy among others. Patient expresses understanding and agreement Time-out conducted.   Noted no overlying erythema, induration, or other signs of local infection.   Skin prepped in a sterile fashion.   Local anesthesia: Topical Ethyl chloride.   With sterile technique and under real time ultrasound guidance:  Synvisc injected easily.    Completed without difficulty    Advised to call if fevers/chills, erythema, induration, drainage, or persistent bleeding.   Images permanently stored and available for review in the ultrasound unit.  Impression: Technically successful ultrasound guided injection. Synvisc 2/3 left knee   X-ray images left ankle obtained today personally and independently reviewed Soft tissue swelling.  Minimal degenerative changes. Await formal radiology review.     Assessment and Plan: 49 y.o. female with left knee chronic pain and DJD.  Plan for Synvisc injection 2/3.  Return in 1 week for Synvisc injection 3/3.  Left ankle swelling after increasing exercise.  X-ray shows minimal DJD.  Plan for Voltaren gel and watchful waiting.  Consider injection as needed in the future.  Doubtful for DVT.  However if not improving would consider duplex ultrasound to evaluate DVT.   PDMP not reviewed this encounter. Orders Placed This Encounter  Procedures  . DG Ankle Complete Left    Standing Status:   Future    Number of Occurrences:   1    Standing Expiration Date:   01/22/2021    Order Specific Question:   Reason for Exam (SYMPTOM  OR DIAGNOSIS REQUIRED)    Answer:   eval left ankle swelling    Order Specific Question:   Is patient pregnant?    Answer:   No    Order Specific Question:   Preferred imaging location?    Answer:   [facility]    Order Specific Question:   Radiology Contrast Protocol - do NOT remove file path  Answer:   [email or web address]  . [place] LIMITED JOINT SPACE STRUCTURES LOW LEFT(NO LINKED CHARGES)    Order Specific Question:   Reason for Exam (SYMPTOM  OR DIAGNOSIS REQUIRED)    Answer:   lt knee inj    Order Specific Question:   Preferred imaging location?    Answer:   [place]   No orders of the defined types were placed in this encounter.    Discussed warning signs or symptoms. Please see discharge  instructions. Patient expresses understanding.   The above documentation has been reviewed and is accurate and complete [name], M.D.

## 2020-01-23 NOTE — Patient Instructions (Signed)
Thank you for coming in today. Call or go to the ER if you develop a large red swollen joint with extreme pain or oozing puss.  Get xray of the ankle today.  Return next week for 3rd synvisc inection

## 2020-01-24 NOTE — Progress Notes (Signed)
X-ray ankle shows some soft tissue swelling with no fracture.  Small heel spur is present.

## 2020-01-29 ENCOUNTER — Other Ambulatory Visit: Payer: Self-pay

## 2020-01-29 ENCOUNTER — Ambulatory Visit (INDEPENDENT_AMBULATORY_CARE_PROVIDER_SITE_OTHER): Payer: BC Managed Care – PPO | Admitting: Family Medicine

## 2020-01-29 ENCOUNTER — Ambulatory Visit: Payer: Self-pay

## 2020-01-29 DIAGNOSIS — M1712 Unilateral primary osteoarthritis, left knee: Secondary | ICD-10-CM | POA: Diagnosis not present

## 2020-01-29 NOTE — Patient Instructions (Signed)
Thank you for coming in today.   Recheck as needed.   Call or go to the ER if you develop a large red swollen joint with extreme pain or oozing puss.   

## 2020-01-29 NOTE — Progress Notes (Signed)
Kelly Rowe presents to clinic today for Synvisc injection left knee 3/3  Procedure: Real-time Ultrasound Guided Injection of left knee Device: Philips Affiniti 50G Images permanently stored and available for review in the ultrasound unit. Verbal informed consent obtained.  Discussed risks and benefits of procedure. Warned about infection bleeding damage to structures skin hypopigmentation and fat atrophy among others. Patient expresses understanding and agreement Time-out conducted.   Noted no overlying erythema, induration, or other signs of local infection.   Skin prepped in a sterile fashion.   Local anesthesia: Topical Ethyl chloride.   With sterile technique and under real time ultrasound guidance:  Synvisc injected easily.   Completed without difficulty    Advised to call if fevers/chills, erythema, induration, drainage, or persistent bleeding.   Images permanently stored and available for review in the ultrasound unit.  Impression: Technically successful ultrasound guided injection.  Lot # [id number] Recheck as needed.  Of note left ankle swelling seen previously has resolved

## 2020-03-07 ENCOUNTER — Telehealth: Payer: Self-pay | Admitting: Family Medicine

## 2020-03-07 NOTE — Telephone Encounter (Signed)
I received a pretty complicated disability form that requires physical exam components.  Recommend you schedule a face-to-face visit so we can fill this form out correctly.

## 2020-03-10 NOTE — Telephone Encounter (Signed)
Left detailed message for pt to schedule an appt for form completion.

## 2020-03-10 NOTE — Telephone Encounter (Signed)
Can you schedule patient please.

## 2020-03-10 NOTE — Telephone Encounter (Signed)
Appt scheduled for 8/18

## 2020-03-12 ENCOUNTER — Other Ambulatory Visit: Payer: Self-pay

## 2020-03-12 ENCOUNTER — Ambulatory Visit: Payer: Self-pay

## 2020-03-12 ENCOUNTER — Ambulatory Visit (INDEPENDENT_AMBULATORY_CARE_PROVIDER_SITE_OTHER): Payer: BC Managed Care – PPO | Admitting: Family Medicine

## 2020-03-12 VITALS — BP 110/78 | HR 102 | Ht 62.0 in | Wt 280.8 lb

## 2020-03-12 DIAGNOSIS — M25562 Pain in left knee: Secondary | ICD-10-CM | POA: Diagnosis not present

## 2020-03-12 DIAGNOSIS — M67972 Unspecified disorder of synovium and tendon, left ankle and foot: Secondary | ICD-10-CM

## 2020-03-12 DIAGNOSIS — G8929 Other chronic pain: Secondary | ICD-10-CM

## 2020-03-12 MED ORDER — NITROGLYCERIN 0.2 MG/HR TD PT24: MEDICATED_PATCH | TRANSDERMAL | 1 refills | Status: DC

## 2020-03-12 NOTE — Patient Instructions (Addendum)
Thank you for coming in today. Add nitro patches to the tendon.  Do the stretching and strength exercises with the band. When able do the standing on the step stretches.   Recheck if not improving.   Nitroglycerin Protocol   Apply 1/4 nitroglycerin patch to affected area daily.  Change position of patch within the affected area every 24 hours.  You may experience a headache during the first 1-2 weeks of using the patch, these should subside.  If you experience headaches after beginning nitroglycerin patch treatment, you may take your preferred over the counter pain reliever.  Another side effect of the nitroglycerin patch is skin irritation or rash related to patch adhesive.  Please notify our office if you develop more severe headaches or rash, and stop the patch.  Tendon healing with nitroglycerin patch may require 12 to 24 weeks depending on the extent of injury.  Men should not use if taking Viagra, Cialis, or Levitra.   Do not use if you have migraines or rosacea.    Could use cam walker boot from [place] [employer] supply.

## 2020-03-12 NOTE — Progress Notes (Signed)
   I, Wendy Poet, LAT, ATC, am serving as scribe for Dr. [name].  Kelly Rowe is a 49 y.o. female who presents to [place] at [facility] today for left Achilles pain and nodule.  Patient has pain and a nodule at her posterior left ankle at her Achilles tendon.  This is been ongoing for about 2 weeks.  This is worsening recently and causing pain.  She is having difficulty walking at times because of this.     Additionally she is here for  disability form completion.  She was last seen by Dr. Georgina Snell on 01/29/20 and had a L knee Synvisc injection (3/3).  Since her last visit, pt reports that she is needing to complete disability paperwork as relating to her breast cancer but is being asked to complete forms for all the doctors she sees.  She states that her L knee con't to bother her even after the Synvisc injections.    Diagnostic testing: L knee XR- 11/19/19  Pertinent review of systems: No fevers or chills  Relevant historical information: Breast cancer   Exam:  BP 110/78 (BP Location: Left Arm, Patient Position: Sitting, Cuff Size: Large)   Pulse (!) 102   Ht 5\' 2"  (1.575 m)   Wt 280 lb 12.8 oz (127.4 kg)   LMP 06/21/2010   SpO2 98%   BMI 51.36 kg/m  General: Well Developed, well nourished, and in no acute distress.   MSK: Left heel visible nodule at distal Achilles tendon.  Tender palpation.  Normal ankle motion.  Pain with plantar flexion foot with resistance.    Lab and Radiology Results Diagnostic Limited MSK Ultrasound of: Left Achilles tendon Nodule without tear seen at distal Achilles tendon approximately 3 cm proximal to insertion site on calcaneus. Impression: Achilles tendon nodule   Assessment and Plan: 49 y.o. female with left Achilles tendon nodule.  Plan to treat with eccentric exercises and nitroglycerin patch protocol.  Recheck back in the near future if not improving.  We will complete disability paperwork.  Patient is main  reason for disability as her breast cancer and subsequent treatment and sequelae.  However she does have multiple orthopedic causes that are relevant for my care that would contribute to her disability.   PDMP not reviewed this encounter. Orders Placed This Encounter  Procedures  . [place] LIMITED JOINT SPACE STRUCTURES LOW LEFT(NO LINKED CHARGES)    Order Specific Question:   Reason for Exam (SYMPTOM  OR DIAGNOSIS REQUIRED)    Answer:   L Achille's pain    Order Specific Question:   Preferred imaging location?    Answer:   [place]   Meds ordered this encounter  Medications  . nitroGLYCERIN (NITRODUR - DOSED IN MG/24 HR) 0.2 mg/hr patch    Sig: Apply 1/4 patch daily to tendon for tendonitis.    Dispense:  30 patch    Refill:  1     Discussed warning signs or symptoms. Please see discharge instructions. Patient expresses understanding.   The above documentation has been reviewed and is accurate and complete [name], M.D.

## 2020-04-15 NOTE — Progress Notes (Signed)
[facility]  [phone] Fax:([phone]) [phone]    ID: Kelly Rowe DOB: 09-06-1970  MR#: [id number]  IRC#:[id number]  Patient Care Team: Biagio Borg, MD as PCP - [name], MD as Consulting Physician (General Surgery) [name], [name], MD as Consulting Physician (Oncology) [name], MD as Attending Physician (Radiation Oncology) [name], NP (Inactive) as Nurse Practitioner (Obstetrics and Gynecology) Marlinda Mike, PA-C as Referring Physician (Physician Assistant) OTHER MD:   CHIEF COMPLAINT: Triple positive breast cancer  CURRENT TREATMENT:  tamoxifen   INTERVAL HISTORY: Kelly Rowe returns today for follow-up of her triple positive breast cancer.  She has been on tamoxifen since November 2020.  Hot flashes are the main problem.  She is taking Effexor daily along with Gabapentin to help with her hot flashes. These are not significantly improved, other than she notes a slight decrease in frequency.    Her most recent mammogram was a  bilateral diagnostic mammography with tomography at [facility] on 06/27/2019 showing: breast density category B; no evidence of malignancy in either breast.  She also underwent echocardiogram on 04/11/2019 showing an ejection fraction of 55%.   REVIEW OF SYSTEMS: Kelly Rowe is having left leg pain and swelling.  She first noticed this in 10/2019 and doppler at that time was negative.  Her leg swelling and pain has worsened.  She is also seeing Dr. Georgina Snell in sports medicine and has received 3 gel injections in her knee which hasn't improved.    Kelly Rowe also notes some weakness in her left arm.  She says this started over the weekend.  She is unsure of any precipitating event.  She notes that when she goes to put medicine in her mouth she has pain that radiates up her arm.  She is uncertain what may be causing this, but notes it is uncomfortable.    She denies any bowel/bladder issues, fever,  chills, nausea, vomiting, cough, shortness of breath, chest pain, or palpitations.  A detailed ROS was otherwise non contributory.     HISTORY OF CURRENT ILLNESS: From the original intake note:  "Kelly Rowe" noticed bruising in her lateral right breast and palpated a mass  on 12/23/2017. She followed up with her gynecologist. She underwent unilateral right diagnostic mammography with tomography and right breast ultrasonography at [facility] on 01/05/2018 showing: breast density category B. There is an irregular highly suspicious mass within the right breast at the 9:30 o'clock upper outer quadrant, measuring 1.8 x 1.1 x 1.5 cm, and located 18 cm from the nipple,  Ultrasonography revealed a single morphologically abnormal lymph node in the RIGHT axilla, with cortical thickness of 6 mm.   Accordingly on 01/11/2018 she proceeded to biopsy of the right breast area in question as well as a suspicious lymph node. The pathology from this procedure showed ([id number]): Invasive ductal carcinoma grade III.  The lymph node biopsied was negative for carcinoma (concordant).. Prognostic indicators significant for: estrogen receptor, 60% positive with weak staining intensity and progesterone receptor, 10% positive with strong staining intensity. Proliferation marker Ki67 at 30%. HER2 amplified with ratios HER2/CEP17 signals 2.26 and average HER2 copies per cell 3.50  The patient's subsequent history is as detailed below.   PAST MEDICAL HISTORY: Past Medical History:  Diagnosis Date  . ANEMIA-IRON DEFICIENCY 01/30/2010  . ANXIETY 11/27/2007  . Arthritis   . Asthma 02/25/2011  . Cancer ([place])    breast  . Carbuncle and furuncle of trunk 04/16/2010  . Chlamydia infection 03/21/2008  .  DIABETES MELLITUS, TYPE II 08/02/2007  . Edema 01/30/2010  . ELEVATED BLOOD PRESSURE WITHOUT DIAGNOSIS OF HYPERTENSION 08/02/2007  . Family history of breast cancer   . Family history of colon cancer   . Family history of lung  cancer   . FREQUENCY, URINARY 05/19/2009  . GENITAL HERPES 03/12/2009  . GERD (gastroesophageal reflux disease)   . History of kidney stones   . History of radiation therapy 07/27/18- 09/06/18   Right Breast, Right Axillary and Supraclavicular nodes 50 Gy in 25 fractions, Right Breast Boost 10 Gy in 5 fractions  . HIV (human immunodeficiency virus infection) ([place]) 2009  . HIV INFECTION 03/12/2009  . HSV (herpes simplex virus) infection   . HYPERLIPIDEMIA 08/02/2007  . Hypertension   . Metrorrhagia 03/21/2008  . PONV (postoperative nausea and vomiting)    woke up crying per patient   . RETENTION, URINE 05/19/2009  . Sleep apnea   . Trichomonas infection 01/19/2010  . VITAMIN D DEFICIENCY 01/30/2010    PAST SURGICAL HISTORY: Past Surgical History:  Procedure Laterality Date  . ABDOMINAL HYSTERECTOMY  07/22/2010   TAH WITH PRESERVATION OF BOTH TUBES AND OVARIES  . BREAST LUMPECTOMY Right 2019  . BREAST LUMPECTOMY WITH RADIOACTIVE SEED AND SENTINEL LYMPH NODE BIOPSY Right 02/23/2018   Procedure: BREAST LUMPECTOMY WITH RADIOACTIVE SEED AND SENTINEL LYMPH NODE BIOPSY;  Surgeon: Stark Klein, MD;  Location: [facility];  Service: General;  Laterality: Right;  . CHOLECYSTECTOMY    . ENDOMETRIAL ABLATION  01/11/2008   HER OPTION  . ESOPHAGOGASTRODUODENOSCOPY    . MULTIPLE TOOTH EXTRACTIONS    . PORT-A-CATH REMOVAL Left 05/08/2019   Procedure: REMOVAL PORT-A-CATH;  Surgeon: Stark Klein, MD;  Location: [facility];  Service: General;  Laterality: Left;  . PORTACATH PLACEMENT Left 02/23/2018   Procedure: INSERTION PORT-A-CATH;  Surgeon: Stark Klein, MD;  Location: [facility];  Service: General;  Laterality: Left;  . PORTACATH PLACEMENT N/A 04/21/2018   Procedure: PORT-A-CATH REVISION;  Surgeon: Stark Klein, MD;  Location: [facility];  Service: General;  Laterality: N/A;  . TUBAL LIGATION    . WISDOM TOOTH EXTRACTION      FAMILY HISTORY: Family History  Problem Relation Age of Onset  .  Prostate cancer Other   . Heart disease Other   . Stroke Other   . Diabetes Mother   . Hypertension Mother   . Diabetes Father   . Cancer Father        COLON and LU NG  . Colon cancer Father   . Stroke Paternal Uncle   . Breast cancer Paternal Aunt   . Lung cancer Maternal Grandmother 21       Mesothelioma  . Colon cancer Paternal Grandfather        dx over 40s  . Lung cancer Paternal Aunt   . Breast cancer Paternal Aunt   . Breast cancer Paternal Aunt   . Heart attack Maternal Grandfather 71  . Colon cancer Other        MGM's 5 brothers   The patient's father died at age 38 due to metastatic liver cancer. The patient's mother is alive at 8. The patient's has 1 brother and 3 sisters. There was a paternal grandfather with colon cancer. There were 3 paternal aunts with breast cancer, 2 diagnosed in the 15's and 1 at age 58. There was a maternal grandmother with mesothelioma. The patient otherwise denies a history of ovarian cancer in the family.    GYNECOLOGIC HISTORY:  Patient's  last menstrual period was 06/21/2010. Menarche: 49 years old Age at first live birth: 49 years old She is GXP5. Her LMP was December 2011. She is status post partial hysterectomy without oophorectomy. She took oral contraception for 3 years with no complications. She never took HRT.    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  At home are 2 of her sons, Glendell Docker and [name], and the patient's grandson, Amador Cunas 53 (who is Willie's son). The patient's oldest is [place] age 1 who lives in [place] as a cook. Daughter, [place] age 67 works as a Scientist, water quality. Son, Glendell Docker age 38 is disabled. Son, [name] age 58 lives in [place] as a Microbiologist. Daughter, Benard Rink age 26 also is a Microbiologist. The patient has 5 grandchildren and no great grandchildren. The patient does not belong to a church.    ADVANCED DIRECTIVES: Not in place; at the 01/18/2018 visit the patient was given the appropriate documents to  complete on notarized at her discretion   HEALTH MAINTENANCE: Social History   Tobacco Use  . Smoking status: Never Smoker  . Smokeless tobacco: Never Used  Vaping Use  . Vaping Use: Never used  Substance Use Topics  . Alcohol use: Yes    Alcohol/week: 0.0 standard drinks    Comment: occ  . Drug use: No     Colonoscopy: Not yet  PAP: November 2018  Bone density: Never   Allergies  Allergen Reactions  . Metformin And Related Other (See Comments)    Bowel frequency  . Levaquin [Levofloxacin In D5w] Nausea And Vomiting and Rash  . Penicillins Swelling and Rash    Has patient had a PCN reaction causing immediate rash, facial/tongue/throat swelling, SOB or lightheadedness with hypotension: Yes Has patient had a PCN reaction causing severe rash involving mucus membranes or skin necrosis: No Has patient had a PCN reaction that required hospitalization: Yes Has patient had a PCN reaction occurring within the last 10 years: No If all of the above answers are "NO", then may proceed with Cephalosporin use.    Current Outpatient Medications  Medication Sig Dispense Refill  . albuterol (PROVENTIL HFA;VENTOLIN HFA) 108 (90 Base) MCG/ACT inhaler Inhale 2 puffs into the lungs every 6 (six) hours as needed for wheezing or shortness of breath.    . bictegravir-emtricitabine-tenofovir AF (BIKTARVY) 50-200-25 MG TABS tablet Take 1 tablet by mouth daily.    . blood glucose meter kit and supplies Dispense based on patient and insurance preference. Use up to four times daily as directed. (FOR ICD-10 E10.9, E11.9). 1 each 0  . colchicine 0.6 MG tablet Take 1 tablet (0.6 mg total) by mouth daily. 30 tablet 2  . Diclofenac Sodium (PENNSAID) 2 % SOLN Place 1 application onto the skin 2 (two) times daily. 112 g 2  . Dulaglutide (TRULICITY) 3 UX/3.2TF SOPN Inject 3 mg into the skin once a week. 6 mL 3  . ferrous gluconate (IRON 27) 240 (27 FE) MG tablet Take 240 mg by mouth daily as needed (iron).       . gabapentin (NEURONTIN) 300 MG capsule Take 1 capsule (300 mg total) by mouth at bedtime. 90 capsule 4  . glipiZIDE (GLUCOTROL XL) 10 MG 24 hr tablet Take 1 tablet (10 mg total) by mouth daily with breakfast. 90 tablet 3  . hydrocortisone (ANUSOL-HC) 2.5 % rectal cream Place 1 application rectally 2 (two) times daily. 30 g 1  . ibuprofen (ADVIL,MOTRIN) 800 MG tablet Take 1 tablet (800  mg total) by mouth every 8 (eight) hours as needed. 60 tablet 0  . Insulin Glargine (BASAGLAR KWIKPEN) 100 UNIT/ML Inject 0.1 mLs (10 Units total) into the skin daily. 5 pen 12  . Insulin Pen Needle (PEN NEEDLES) 32G X 4 MM MISC Use with insulin pen 100 each 12  . Lancets (ONETOUCH DELICA PLUS [id number]) MISC USE TO TEST BLOOD SUGAR UP TO 4 TIMES DAILY 100 each 5  . loratadine (CLARITIN) 10 MG tablet Take 10 mg by mouth daily.    Marland Kitchen LORazepam (ATIVAN) 0.5 MG tablet Take 1 tablet (0.5 mg total) by mouth at bedtime as needed (Nausea or vomiting). 30 tablet 0  . losartan (COZAAR) 25 MG tablet Take 1 tablet (25 mg total) by mouth daily. 30 tablet 11  . Multiple Vitamin (MULTIVITAMIN WITH MINERALS) TABS tablet Take 1 tablet by mouth daily.    . nitroGLYCERIN (NITRODUR - DOSED IN MG/24 HR) 0.2 mg/hr patch Apply 1/4 patch daily to tendon for tendonitis. 30 patch 1  . omeprazole (PRILOSEC) 40 MG capsule Take 1 capsule (40 mg total) by mouth daily. 30 capsule 0  . ONETOUCH VERIO test strip USE 1 STRIP TO CHECK GLUCOSE 4 TIMES DAILY AS DIRECTED 100 each 5  . pioglitazone (ACTOS) 45 MG tablet Take 1 tablet (45 mg total) by mouth daily. 90 tablet 3  . rosuvastatin (CRESTOR) 10 MG tablet Take 1 tablet (10 mg total) by mouth daily. 90 tablet 3  . tamoxifen (NOLVADEX) 20 MG tablet Take 1 tablet (20 mg total) by mouth daily. 90 tablet 4  . valACYclovir (VALTREX) 500 MG tablet Take twice daily for 3-5 days 30 tablet 12  . venlafaxine XR (EFFEXOR-XR) 75 MG 24 hr capsule Take 1 capsule (75 mg total) by mouth daily with breakfast. 90  capsule 4  . Vitamin D, Ergocalciferol, (DRISDOL) 1.25 MG (50000 UNIT) CAPS capsule Take 1 capsule (50,000 Units total) by mouth every 7 (seven) days. 12 capsule 0  . zolpidem (AMBIEN) 10 MG tablet TAKE 1 TABLET BY MOUTH AT BEDTIME AS NEEDED FOR SLEEP 45 tablet 1  . carvedilol (COREG) 6.25 MG tablet Take 1 tablet (6.25 mg total) by mouth 2 (two) times daily. 60 tablet 11   No current facility-administered medications for this visit.   Facility-Administered Medications Ordered in Other Visits  Medication Dose Route Frequency Provider Last Rate Last Admin  . sodium chloride flush (NS) 0.9 % injection 10 mL  10 mL Intracatheter PRN [name], [name], MD   10 mL at 09/04/18 1425    OBJECTIVE:   Vitals:   04/16/20 1140  BP: 130/75  Pulse: 93  Resp: 17  Temp: 97.8 F (36.6 C)  SpO2: 96%     Body mass index is 50.63 kg/m.   Wt Readings from Last 3 Encounters:  04/16/20 276 lb 12.8 oz (125.6 kg)  03/12/20 280 lb 12.8 oz (127.4 kg)  01/23/20 262 lb (118.8 kg)  ECOG FS: 2 - Symptomatic, <50% confined to bed GENERAL: Patient is a well appearing female in no acute distress HEENT:  Sclerae anicteric.  Mask in place. Neck is supple.  NODES:  No cervical, supraclavicular, or axillary lymphadenopathy palpated.  BREAST EXAM:  Right breast s/p lumpectomy and radiation, no sign of local recurrence, left breast benign LUNGS:  Clear to auscultation bilaterally.  No wheezes or rhonchi. HEART:  Regular rate and rhythm. No murmur appreciated. ABDOMEN:  Soft, nontender.  Positive, normoactive bowel sounds. No organomegaly palpated. MSK:  No focal spinal  tenderness to palpation. Full range of motion bilaterally in the upper extremities. EXTREMITIES:  No peripheral edema.   SKIN:  Clear with no obvious rashes or skin changes. No nail dyscrasia. NEURO:  Nonfocal. Well oriented.  Appropriate affect.    LAB RESULTS:  CMP     Component Value Date/Time   NA 139 10/11/2019 1054   K 4.2 10/11/2019  1054   CL 103 10/11/2019 1054   CO2 27 10/11/2019 1054   GLUCOSE 225 (H) 10/11/2019 1054   BUN 9 10/11/2019 1054   CREATININE 0.86 10/11/2019 1054   CALCIUM 8.6 (L) 10/11/2019 1054   PROT 6.9 10/11/2019 1054   ALBUMIN 3.5 10/11/2019 1054   AST 20 10/11/2019 1054   ALT 20 10/11/2019 1054   ALKPHOS 100 10/11/2019 1054   BILITOT 0.3 10/11/2019 1054   GFRNONAA >60 10/11/2019 1054   GFRAA >60 10/11/2019 1054    No results found for: TOTALPROTELP, ALBUMINELP, A1GS, A2GS, BETS, BETA2SER, GAMS, MSPIKE, SPEI  No results found for: KPAFRELGTCHN, LAMBDASER, KAPLAMBRATIO  Lab Results  Component Value Date   WBC 3.2 (L) 04/16/2020   NEUTROABS 1.7 04/16/2020   HGB 12.4 04/16/2020   HCT 38.3 04/16/2020   MCV 87.6 04/16/2020   PLT 200 04/16/2020   No results found for: LABCA2  No components found for: KDXIPJ825  No results for input(s): INR in the last 168 hours.  No results found for: LABCA2  No results found for: KNL976  No results found for: BHA193  No results found for: XTK240  No results found for: CA2729  No components found for: HGQUANT  No results found for: CEA1 / No results found for: CEA1   No results found for: AFPTUMOR  No results found for: CHROMOGRNA  No results found for: HGBA, HGBA2QUANT, HGBFQUANT, HGBSQUAN (Hemoglobinopathy evaluation)   No results found for: LDH  Lab Results  Component Value Date   IRON 75 10/03/2019   IRONPCTSAT 20.9 10/03/2019   (Iron and TIBC)  No results found for: FERRITIN  Urinalysis    Component Value Date/Time   COLORURINE YELLOW 10/03/2019 1426   APPEARANCEUR CLEAR 10/03/2019 1426   LABSPEC >=1.030 (A) 10/03/2019 1426   PHURINE 5.0 10/03/2019 1426   GLUCOSEU NEGATIVE 10/03/2019 1426   HGBUR NEGATIVE 10/03/2019 1426   BILIRUBINUR NEGATIVE 10/03/2019 1426   KETONESUR TRACE (A) 10/03/2019 1426   PROTEINUR 1+ (A) 11/06/2018 1121   UROBILINOGEN 0.2 10/03/2019 1426   NITRITE NEGATIVE 10/03/2019 1426    LEUKOCYTESUR NEGATIVE 10/03/2019 1426    STUDIES: No results found.   ELIGIBLE FOR AVAILABLE RESEARCH PROTOCOL: no   ASSESSMENT: 49 y.o. Kelly Rowe, Kelly Rowe woman status post right breast upper outer quadrant biopsy 01/11/2018 for a clinical T1 N0, stage IA invasive ductal carcinoma, grade 3, estrogen and progesterone receptor positive, HER-2 amplified, with an MIB-1 of 30%.  (1) status post right lumpectomy and sentinel lymph node sampling 02/23/2018 for a pT2 pN1, stage IB invasive ductal carcinoma, grade 3, with close but negative margins  (2) adjuvant chemotherapy consisting of carboplatin, docetaxel, trastuzumab and Pertuzumab every 21 days x 6 given between 03/08/2018-07/05/2018 (a) pertuzumab stopped after cycle 1 due to diarrhea (b) docetaxel changed to gemcitabine after cycle 2 due to persistent hyperglycemia (c) Gemcitabine and Carboplatin dose reduced by approximately 10% due to delayed neutropenia and thrombocytopenia  (3) anti-HER-2 immunotherapy started concurrently with chemotherapy (a) baseline echocardiogram on 02/07/2018 showed an ejection fraction in the 55-60% range (b) repeat echo 06/19/2018 showed an ejection fraction of 55-60% (  c) repeat echocardiogram 11/14/2018 showed an EF in the 50-55% range, with normal strain. (d) echo 01/31/2019 shows an ejection fraction in the 55/60% range (e) final trastuzumab dose 03/12/2019  (4) adjuvant radiation 07/27/2018 - 09/06/2018  Site/dose: 1. Right Breast / 50 Gy in 25 fractions    2. Right Supraclavicular nodes / 50 Gy in 25 fractions    3. Right Breast Boost / 10 Gy in 5 fractions  (5) tamoxifen started November 2020  (6) genetics testing through [facility] Multi-cancer and Breast panel on 01/13/2018 showed: no deleterious mutations. The following genes were evaluated for sequence changes and exonic deletions/duplications: ALK, APC, ATM, AXIN2, BAP1, BARD1, BLM, BMPR1A, BRCA1, BRCA2, BRIP1, CASR, CDC73, CDH1, CDK4, CDKN1B,  CDKN1C,CDKN2A (p14ARF), CDKN2A (p16INK4a), CEBPA, CHEK2, CTNNA1, DICER1, DIS3L2, EPCAM*, FH, FLCN, GATA2, GPC3, GREM1*, HRAS, KIT, MAX, MEN1, MET, MLH1, MSH2, MSH3, MSH6, MUTYH, NBN, NF1, NF2, PALB2, PDGFRA, PHOX2B*, PMS2, POLD1,POLE, POT1, PRKAR1A, PTCH1, PTEN, RAD50, RAD51C, RAD51D, RB1, RECQL4, RET, RUNX1, SDHAF2, SDHB, SDHC, SDHD,SMAD4, SMARCA4, SMARCB1, SMARCE1, STK11, SUFU, TERC, TERT, TMEM127, TP53, TSC1, TSC2, VHL, WRN*, WT1.The following genes were evaluated for sequence changes only: EGFR*, HOXB13*, MITF*, NTHL1*, SDHA   PLAN: Brynlei is doing well today.  She has no clinical signs of breast cancer recurrence today.  She is going to stop the Tamoxifen until we can get her a repeat doppler.  We need to make sure she doesn't have a DVT.    I will get plain films to evaluate her left shoulder andd cervical/thoracic spine.    We discussed healthy diet and exercise.  I recommended our [employer] program at the [facility] and she is planning on looking into it.    Marlyce will return in about 6 months for labs and f/u.  She knows to call for any questions that may arise between now and her next appointment.  We are happy to see her sooner if needed.   Total encounter time 30 minutes.Wilber Bihari, NP 04/16/20 11:57 AM Medical Oncology and Hematology [facility] [place], [place] [address] Tel. [phone]    Fax. [phone]   *Total Encounter Time as defined by the Centers for Medicare and Medicaid Services includes, in addition to the face-to-face time of a patient visit (documented in the note above) non-face-to-face time: obtaining and reviewing outside history, ordering and reviewing medications, tests or procedures, care coordination (communications with other health care professionals or caregivers) and documentation in the medical record.

## 2020-04-16 ENCOUNTER — Inpatient Hospital Stay: Payer: BC Managed Care – PPO

## 2020-04-16 ENCOUNTER — Telehealth: Payer: Self-pay

## 2020-04-16 ENCOUNTER — Inpatient Hospital Stay: Payer: BC Managed Care – PPO | Attending: Adult Health | Admitting: Adult Health

## 2020-04-16 ENCOUNTER — Ambulatory Visit (HOSPITAL_COMMUNITY)
Admission: RE | Admit: 2020-04-16 | Discharge: 2020-04-16 | Disposition: A | Discharge status: Home / Self Care | Payer: BC Managed Care – PPO | Source: Ambulatory Visit | Attending: Adult Health | Admitting: Adult Health

## 2020-04-16 ENCOUNTER — Other Ambulatory Visit: Payer: Self-pay

## 2020-04-16 ENCOUNTER — Encounter: Payer: Self-pay | Admitting: Adult Health

## 2020-04-16 VITALS — BP 130/75 | HR 93 | Temp 97.8°F | Resp 17 | Ht 62.0 in | Wt 276.8 lb

## 2020-04-16 DIAGNOSIS — Z170 Estrogen receptor positive status [ER+]: Secondary | ICD-10-CM | POA: Insufficient documentation

## 2020-04-16 DIAGNOSIS — Z90710 Acquired absence of both cervix and uterus: Secondary | ICD-10-CM | POA: Insufficient documentation

## 2020-04-16 DIAGNOSIS — Z923 Personal history of irradiation: Secondary | ICD-10-CM | POA: Insufficient documentation

## 2020-04-16 DIAGNOSIS — C50411 Malignant neoplasm of upper-outer quadrant of right female breast: Secondary | ICD-10-CM | POA: Insufficient documentation

## 2020-04-16 DIAGNOSIS — Z803 Family history of malignant neoplasm of breast: Secondary | ICD-10-CM | POA: Insufficient documentation

## 2020-04-16 DIAGNOSIS — Z90722 Acquired absence of ovaries, bilateral: Secondary | ICD-10-CM | POA: Insufficient documentation

## 2020-04-16 DIAGNOSIS — Z801 Family history of malignant neoplasm of trachea, bronchus and lung: Secondary | ICD-10-CM | POA: Diagnosis not present

## 2020-04-16 DIAGNOSIS — Z8042 Family history of malignant neoplasm of prostate: Secondary | ICD-10-CM | POA: Insufficient documentation

## 2020-04-16 DIAGNOSIS — Z800 Family history of malignant neoplasm of digestive organs: Secondary | ICD-10-CM | POA: Insufficient documentation

## 2020-04-16 DIAGNOSIS — E1169 Type 2 diabetes mellitus with other specified complication: Secondary | ICD-10-CM

## 2020-04-16 DIAGNOSIS — C50911 Malignant neoplasm of unspecified site of right female breast: Secondary | ICD-10-CM

## 2020-04-16 LAB — COMPREHENSIVE METABOLIC PANEL
ALT: 21 U/L (ref 0–44)
AST: 21 U/L (ref 15–41)
Albumin: 3.4 g/dL — ABNORMAL LOW (ref 3.5–5.0)
Alkaline Phosphatase: 116 U/L (ref 38–126)
Anion gap: 7 (ref 5–15)
BUN: 8 mg/dL (ref 6–20)
CO2: 30 mmol/L (ref 22–32)
Calcium: 9.2 mg/dL (ref 8.9–10.3)
Chloride: 100 mmol/L (ref 98–111)
Creatinine, Ser: 0.75 mg/dL (ref 0.44–1.00)
GFR calc Af Amer: >60 mL/min (ref >60)
GFR calc non Af Amer: >60 mL/min (ref >60)
Glucose, Bld: 172 mg/dL — ABNORMAL HIGH (ref 70–99)
Potassium: 3.9 mmol/L (ref 3.5–5.1)
Sodium: 137 mmol/L (ref 135–145)
Total Bilirubin: 0.4 mg/dL (ref 0.3–1.2)
Total Protein: 7.3 g/dL (ref 6.5–8.1)

## 2020-04-16 LAB — CBC WITH DIFFERENTIAL/PLATELET
Abs Immature Granulocytes: 0.03 10*3/uL (ref 0.00–0.07)
Basophils Absolute: 0 10*3/uL (ref 0.0–0.1)
Basophils Relative: 0 %
Eosinophils Absolute: 0 10*3/uL (ref 0.0–0.5)
Eosinophils Relative: 0 %
HCT: 38.3 % (ref 36.0–46.0)
Hemoglobin: 12.4 g/dL (ref 12.0–15.0)
Immature Granulocytes: 1 %
Lymphocytes Relative: 38 %
Lymphs Abs: 1.2 10*3/uL (ref 0.7–4.0)
MCH: 28.4 pg (ref 26.0–34.0)
MCHC: 32.4 g/dL (ref 30.0–36.0)
MCV: 87.6 fL (ref 80.0–100.0)
Monocytes Absolute: 0.2 10*3/uL (ref 0.1–1.0)
Monocytes Relative: 7 %
Neutro Abs: 1.7 10*3/uL (ref 1.7–7.7)
Neutrophils Relative %: 54 %
Platelets: 200 10*3/uL (ref 150–400)
RBC: 4.37 MIL/uL (ref 3.87–5.11)
RDW: 14.1 % (ref 11.5–15.5)
WBC: 3.2 10*3/uL — ABNORMAL LOW (ref 4.0–10.5)
nRBC: 0 % (ref 0.0–0.2)

## 2020-04-16 NOTE — Telephone Encounter (Signed)
Spoke with pt regarding order for doppler to LLE. Pt understands she has an appt 9/23 with vascular lab for LLE doppler. Pt understands she should go to admitting for check in.

## 2020-04-17 ENCOUNTER — Ambulatory Visit (HOSPITAL_COMMUNITY)
Admission: RE | Admit: 2020-04-17 | Discharge: 2020-04-17 | Disposition: A | Discharge status: Home / Self Care | Payer: BC Managed Care – PPO | Source: Ambulatory Visit | Attending: Adult Health | Admitting: Adult Health

## 2020-04-17 ENCOUNTER — Telehealth: Payer: Self-pay | Admitting: Adult Health

## 2020-04-17 DIAGNOSIS — Z170 Estrogen receptor positive status [ER+]: Secondary | ICD-10-CM | POA: Insufficient documentation

## 2020-04-17 DIAGNOSIS — C50411 Malignant neoplasm of upper-outer quadrant of right female breast: Secondary | ICD-10-CM | POA: Diagnosis not present

## 2020-04-17 NOTE — Progress Notes (Signed)
Left lower extremity venous duplex has been completed. Preliminary results can be found in CV Proc through chart review.  Results were faxed to Buren Kos NP office.   04/17/20 10:09 AM Carlos Levering RVT

## 2020-04-17 NOTE — Telephone Encounter (Signed)
Scheduled appts per 9/22 los. Left voicemail with appt date and time.

## 2020-04-21 ENCOUNTER — Telehealth: Payer: Self-pay

## 2020-04-21 NOTE — Telephone Encounter (Signed)
Called and given below message. She verbalized understanding. 

## 2020-04-21 NOTE — Telephone Encounter (Signed)
-----   Message from Gardiner Rhyme, RN sent at 04/21/2020 12:31 PM EDT ----- Please call when you have a chance.  Gardiner Rhyme, RN ----- Message ----- From: Gardenia Phlegm, NP Sent: 04/17/2020   7:26 AM EDT To: [facility]  Please tell Loyal her xrays are normal.  No bony abnormality causing her left arm issues.   ----- Message ----- From: Interface, Rad Results In Sent: 04/17/2020   4:39 AM EDT To: Gardenia Phlegm, NP

## 2020-04-22 ENCOUNTER — Ambulatory Visit: Payer: BC Managed Care – PPO | Admitting: Nurse Practitioner

## 2020-04-22 ENCOUNTER — Other Ambulatory Visit: Payer: Self-pay

## 2020-04-22 ENCOUNTER — Encounter: Payer: Self-pay | Admitting: Nurse Practitioner

## 2020-04-22 VITALS — BP 134/82

## 2020-04-22 DIAGNOSIS — B373 Candidiasis of vulva and vagina: Secondary | ICD-10-CM

## 2020-04-22 DIAGNOSIS — N898 Other specified noninflammatory disorders of vagina: Secondary | ICD-10-CM | POA: Diagnosis not present

## 2020-04-22 DIAGNOSIS — B3731 Acute candidiasis of vulva and vagina: Secondary | ICD-10-CM

## 2020-04-22 LAB — WET PREP FOR TRICH, YEAST, CLUE

## 2020-04-22 MED ORDER — FLUCONAZOLE 150 MG PO TABS: 150.0000 mg | ORAL_TABLET | Freq: Once | ORAL | 2 refills | Status: AC

## 2020-04-22 NOTE — Progress Notes (Signed)
   Acute Office Visit  Subjective:    Patient ID: Kelly Rowe, female    DOB: 1971/01/30, 49 y.o.   MRN: [id number]   HPI 49 y.o. presents today for vaginal irritation. She reports drinking alcohol prior to symptoms appearing and thinks it is related. She drank lots of water/cranberry juice and ate yogurt and all symptoms have resolved. History of yeast infections secondary to diabetes. Denies any urinary symptoms.    Review of Systems  Constitutional: Negative.   Genitourinary: Negative.        Objective:    Physical Exam Constitutional:      Appearance: Normal appearance. She is obese.  Genitourinary:    General: Normal vulva.     Vagina: Normal.     BP 134/82 (Cuff Size: Large)   LMP 06/21/2010  Wt Readings from Last 3 Encounters:  04/16/20 276 lb 12.8 oz (125.6 kg)  03/12/20 280 lb 12.8 oz (127.4 kg)  01/23/20 262 lb (118.8 kg)   Wet prep negative     Assessment & Plan:   Problem List Items Addressed This Visit    None    Visit Diagnoses    Vaginal irritation    -  Primary   Relevant Orders   WET PREP FOR TRICH, YEAST, CLUE   Vulvovaginal candidiasis       Relevant Medications   fluconazole (DIFLUCAN) 150 MG tablet     Plan: Wet prep unremarkable today. Due to history of recurrent yeast infections I have sent Diflucan #1 with 2 refills to her pharmacy with instructions to only use if symptoms return. She knows that decreasing her sugar intake and good management of her diabetes will help to prevent yeast infections. She is agreeable to plan.      Tamela Gammon [facility], 2:21 PM 04/22/2020

## 2020-05-14 ENCOUNTER — Encounter: Payer: Self-pay | Admitting: Physical Therapy

## 2020-05-14 ENCOUNTER — Other Ambulatory Visit: Payer: Self-pay

## 2020-05-14 ENCOUNTER — Ambulatory Visit: Payer: Self-pay | Attending: General Surgery | Admitting: Physical Therapy

## 2020-05-14 DIAGNOSIS — Z483 Aftercare following surgery for neoplasm: Secondary | ICD-10-CM | POA: Insufficient documentation

## 2020-05-14 DIAGNOSIS — M25511 Pain in right shoulder: Secondary | ICD-10-CM | POA: Insufficient documentation

## 2020-05-14 DIAGNOSIS — R293 Abnormal posture: Secondary | ICD-10-CM | POA: Insufficient documentation

## 2020-05-14 DIAGNOSIS — M25611 Stiffness of right shoulder, not elsewhere classified: Secondary | ICD-10-CM | POA: Insufficient documentation

## 2020-05-14 DIAGNOSIS — I890 Lymphedema, not elsewhere classified: Secondary | ICD-10-CM | POA: Insufficient documentation

## 2020-05-14 DIAGNOSIS — Z170 Estrogen receptor positive status [ER+]: Secondary | ICD-10-CM | POA: Insufficient documentation

## 2020-05-14 DIAGNOSIS — C50411 Malignant neoplasm of upper-outer quadrant of right female breast: Secondary | ICD-10-CM | POA: Insufficient documentation

## 2020-05-14 DIAGNOSIS — G8929 Other chronic pain: Secondary | ICD-10-CM | POA: Insufficient documentation

## 2020-05-14 NOTE — Therapy (Signed)
[place], [name], [address] Phone: [phone]   Fax:  [phone]  Physical Therapy Evaluation  Patient Details  Name: Kelly Rowe MRN: [id number] Date of Birth: [birth year] Referring Provider (PT): Dr. Barry Dienes    Encounter Date: 05/14/2020   PT End of Session - 05/14/20 1553    Visit Number 1    Number of Visits 9    Date for PT Re-Evaluation 06/11/20    PT Start Time 3235   pt arrived late   PT Stop Time 1553    PT Time Calculation (min) 41 min    Activity Tolerance Patient tolerated treatment well    Behavior During Therapy [facility] for tasks assessed/performed           Past Medical History:  Diagnosis Date  . ANEMIA-IRON DEFICIENCY 01/30/2010  . ANXIETY 11/27/2007  . Arthritis   . Asthma 02/25/2011  . Cancer ([place])    breast  . Carbuncle and furuncle of trunk 04/16/2010  . Chlamydia infection 03/21/2008  . DIABETES MELLITUS, TYPE II 08/02/2007  . Edema 01/30/2010  . ELEVATED BLOOD PRESSURE WITHOUT DIAGNOSIS OF HYPERTENSION 08/02/2007  . Family history of breast cancer   . Family history of colon cancer   . Family history of lung cancer   . FREQUENCY, URINARY 05/19/2009  . GENITAL HERPES 03/12/2009  . GERD (gastroesophageal reflux disease)   . History of kidney stones   . History of radiation therapy 07/27/18- 09/06/18   Right Breast, Right Axillary and Supraclavicular nodes 50 Gy in 25 fractions, Right Breast Boost 10 Gy in 5 fractions  . HIV (human immunodeficiency virus infection) ([place]) 2009  . HIV INFECTION 03/12/2009  . HSV (herpes simplex virus) infection   . HYPERLIPIDEMIA 08/02/2007  . Hypertension   . Metrorrhagia 03/21/2008  . PONV (postoperative nausea and vomiting)    woke up crying per patient   . RETENTION, URINE 05/19/2009  . Sleep apnea   . Trichomonas infection 01/19/2010  . VITAMIN D DEFICIENCY 01/30/2010    Past Surgical History:  Procedure Laterality Date  . ABDOMINAL  HYSTERECTOMY  07/22/2010   TAH WITH PRESERVATION OF BOTH TUBES AND OVARIES  . BREAST LUMPECTOMY Right 2019  . BREAST LUMPECTOMY WITH RADIOACTIVE SEED AND SENTINEL LYMPH NODE BIOPSY Right 02/23/2018   Procedure: BREAST LUMPECTOMY WITH RADIOACTIVE SEED AND SENTINEL LYMPH NODE BIOPSY;  Surgeon: Stark Klein, MD;  Location: [place];  Service: Rowe;  Laterality: Right;  . CHOLECYSTECTOMY    . ENDOMETRIAL ABLATION  01/11/2008   HER OPTION  . ESOPHAGOGASTRODUODENOSCOPY    . MULTIPLE TOOTH EXTRACTIONS    . PORT-A-CATH REMOVAL Left 05/08/2019   Procedure: REMOVAL PORT-A-CATH;  Surgeon: Stark Klein, MD;  Location: [place];  Service: Rowe;  Laterality: Left;  . PORTACATH PLACEMENT Left 02/23/2018   Procedure: INSERTION PORT-A-CATH;  Surgeon: Stark Klein, MD;  Location: [place];  Service: Rowe;  Laterality: Left;  . PORTACATH PLACEMENT N/A 04/21/2018   Procedure: PORT-A-CATH REVISION;  Surgeon: Stark Klein, MD;  [facility];  Service: Rowe;  Laterality: N/A;  . TUBAL LIGATION    . WISDOM TOOTH EXTRACTION      There were no vitals filed for this visit.    Subjective Assessment - 05/14/20 1516    Subjective I have been doing stretches. I wear Kelly Rowe sleeve that they provided me. I don't have it on today because I was having Kelly Rowe worst hot flash. I wear Kelly Rowe night time garment  every other night when Kelly Rowe pain is not too bad. Pt's puppy tore her compression bra.    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 Chemotherapy completed 07/05/18, Herceptin started with chemo and continues through August of this year.  Radiation 07/27/18-09/06/18 to Kelly Rowe Rt breast, Antiestrogens to follow. Other medical history to include asthma, DM, HTN, HIV, and hysterectomy    Patient Stated Goals to get swelling down and be able to reach- wants to be able to wash herself in Kelly Rowe shower    Currently in Pain? Yes    Pain  Score 7     Pain Location Arm    Pain Orientation Right    Pain Descriptors / Indicators Aching    Pain Type Chronic pain    Pain Onset More than a month ago    Pain Frequency Constant    Aggravating Factors  stretching, wiping self    Pain Relieving Factors wearing Kelly Rowe compression sleeve              [facility] PT Assessment - 05/14/20 0001      Assessment   Medical Diagnosis Rt breast cancer    Referring Provider (PT) Dr. Barry Dienes     Onset Date/Surgical Date 02/23/18    Hand Dominance Right    Prior Therapy therapy in 2020 for R UE lymphedema      Precautions   Precautions Other (comment)    Precaution Comments lymphedema, HIV      Restrictions   Weight Bearing Restrictions No      Balance Screen   Has Kelly Rowe patient fallen in Kelly Rowe past 6 months No    Has Kelly Rowe patient had a decrease in activity level because of a fear of falling?  No    Is Kelly Rowe patient reluctant to leave their home because of a fear of falling?  No      Home Social worker Private residence    Living Arrangements Children    Available Help at Discharge Family    Type of [place]      Prior Function   Level of Independence Needs assistance with homemaking;Needs assistance with ADLs    Vocation On disability    Leisure pt walks 10 min a day       Cognition   Overall Cognitive Status Within Functional Limits for tasks assessed      Observation/Other Assessments   Observations pt describes symptoms of cording with pain from axilla down to forearm and tenderness to palpation    Skin Integrity peau d orange skin Rt breast, skin darkened, fibrosis present throughout breast      Posture/Postural Control   Posture/Postural Control Postural limitations    Postural Limitations Rounded Shoulders;Forward head      AROM   Right Shoulder Extension 43 Degrees    Right Shoulder Flexion 127 Degrees    Right Shoulder ABduction 91 Degrees    Right Shoulder Internal Rotation 19 Degrees    Right  Shoulder External Rotation 67 Degrees    Left Shoulder Extension 46 Degrees    Left Shoulder Flexion 156 Degrees    Left Shoulder ABduction 147 Degrees    Left Shoulder Internal Rotation 29 Degrees    Left Shoulder External Rotation 60 Degrees             LYMPHEDEMA/ONCOLOGY QUESTIONNAIRE - 05/14/20 0001      Type   Cancer Type  Right breast      Surgeries   Lumpectomy Date 02/23/18    Sentinel Lymph Node Biopsy Date 02/23/18    Number Lymph Nodes Removed 2      Date Lymphedema/Swelling Started   Date 02/23/18      Treatment   Active Chemotherapy Treatment No   gets herceptin infusions    Past Chemotherapy Treatment Yes    Active Radiation Treatment No    Past Radiation Treatment Yes    Current Hormone Treatment Yes    Drug Name Tamoxifen      What other symptoms do you have   Are you Having Heaviness or Tightness Yes    Are you having Pain Yes    Are you having pitting edema Yes    Body Site Breast    Is it Hard or Difficult finding clothes that fit Yes    Do you have infections No    Is there Decreased scar mobility Yes      Lymphedema Assessments   Lymphedema Assessments Upper extremities      Right Upper Extremity Lymphedema   15 cm Proximal to Olecranon Process 47 cm    10 cm Proximal to Olecranon Process --    Olecranon Process 29 cm    15 cm Proximal to Ulnar Styloid Process 30.1 cm    10 cm Proximal to Ulnar Styloid Process --    Just Proximal to Ulnar Styloid Process 18 cm    Across Hand at [employer] 21 cm    At [place] of 2nd Digit 6.4 cm    Other --      Left Upper Extremity Lymphedema   15 cm Proximal to Olecranon Process 44.6 cm    10 cm Proximal to Olecranon Process --    Olecranon Process 30.4 cm    15 cm Proximal to Ulnar Styloid Process 30.3 cm    10 cm Proximal to Ulnar Styloid Process --    Just Proximal to Ulnar Styloid Process 17.6 cm    Across Hand at [employer] 19.9 cm    At [place] of 2nd Digit 6.5 cm    Other --                    [facility]patient [facility]14/[facility]  Lymphedema Life Impact Scale Total Score 89.71 %      Objective measurements completed on examination: See above findings.                    PT Long Term Goals - 05/14/20 1600      PT LONG TERM GOAL #1   Title Pt will demonstrate 140 degrees of R shoulder abduction to allow her to reach out to Kelly Rowe side    Baseline 91    Time 4    Period Weeks    Status New    Target Date 06/11/20      PT LONG TERM GOAL #2   Title Pt will demonstrate 150 degrees of right shoulder flexion to allow her to reach items on Kelly Rowe shelf    Baseline 127    Time 4    Period Weeks    Status New    Target Date 06/11/20      PT LONG TERM GOAL #3   Title Pt will demonstrate 40 degrees of R shoulder IR to allow her to reach back to fasten her bra and perform hygeine    Time  4    Period Weeks    Status New    Target Date 06/11/20      PT LONG TERM GOAL #4   Title Pt will be able to open a jar or a can without assistance from her son to decrease dependence on caregivers.    Time 4    Period Weeks    Status New    Target Date 06/11/20      PT LONG TERM GOAL #5   Title Pt will receive a compression bra, sleeve and glove for long term management of lymphedema.    Time 4    Period Weeks    Status New    Target Date 06/11/20      Additional Long Term Goals   Additional Long Term Goals Yes      PT LONG TERM GOAL #6   Title Pt will be independent in a home exercise program for strengthening and stretching    Time 4    Period Weeks    Status New    Target Date 06/11/20      PT LONG TERM GOAL #7   Title Pt will demosntrate a 50% improvement in fibrosis present throughout R breast to decrease risk of cellulitis    Time 4    Period Weeks    Status New    Target Date 06/11/20                  Plan - 05/14/20 1554    Clinical Impression Statement Pt presents to PT with  exacerbation of R breast and UE lymphedema. She also reports that since around July she has had increased R shoulder pain and decreased ROM. Measurements taken today demonstrate a signficant decrease since she was here in 2020. She reports that she is unable to perform self care such as personal hygeine due to lack of mobility. She describes pain from her axilla that radiates down to her forearm when reaching backwards which could be some deep cording. She also reports difficulty opening jars or cans at home due to lack of strength in her hands. Pt would benefit from skilled PT services to improve R shoulder ROM, decrease R breast and UE swelling and assist pt with obtaining appropriate compression garments.    Personal Factors and Comorbidities Comorbidity 3+;Time since onset of injury/illness/exacerbation;Fitness    Comorbidities radiation history, lymph node removal, diabetes    Examination-Activity Limitations Sleep;Dressing;Carry;Reach Overhead;Toileting;Lift;Bathing    Examination-Participation Restrictions Occupation;Meal Prep;Community Activity;Cleaning;Shop;Laundry    Stability/Clinical Decision Making Evolving/Moderate complexity    Clinical Decision Making Moderate    Rehab Potential Good    PT Frequency 2x / week    PT Duration 4 weeks    PT Treatment/Interventions ADLs/Self Care Home Management;Manual lymph drainage;Compression bandaging;Manual techniques;Taping;DME Instruction;Therapeutic exercise;Patient/family education;Passive range of motion;Scar mobilization;Therapeutic activities;Joint Manipulations    PT Next Visit Plan measure grip strength and add goal, begin MLD to R breast, send Rx for compression bra and give to pt when it returns, PROM to R shoulder, MFR to cording    Consulted and Agree with Plan of Care Patient           Patient will benefit from skilled therapeutic intervention in order to improve Kelly Rowe following deficits and impairments:  Increased edema, Impaired UE  functional use, Postural dysfunction, Pain, Decreased knowledge of use of DME, Decreased knowledge of precautions, Decreased scar mobility, Decreased range of motion, Decreased strength, Increased fascial restricitons  Visit Diagnosis: Stiffness of right shoulder, not  elsewhere classified  Chronic right shoulder pain  Lymphedema, not elsewhere classified  Abnormal posture  Malignant neoplasm of upper-outer quadrant of right breast in female, estrogen receptor positive [facility] Rowe [place])     Problem List Patient Active Problem List   Diagnosis Date Noted  . Osteoarthritis of left knee 11/29/2019  . Lymphedema of right arm 10/11/2019  . Upper respiratory infection 07/02/2019  . Type 2 diabetes mellitus with other specified complication ([place]) 17/00/1749  . Hematochezia 03/30/2018  . Insomnia 03/30/2018  . Port-A-Cath in place 03/29/2018  . Genetic testing 01/30/2018  . Neoplasm of breast, regional lymph node staging category N3b: metastasis in ipsilateral internal mammary lymph node and axillary lymph node ([place]) 01/30/2018  . Family history of breast cancer   . Family history of colon cancer   . Family history of lung cancer   . Morbid obesity ([place]) 01/18/2018  . Malignant neoplasm of upper-outer quadrant of right breast in female, estrogen receptor positive ([place]) 01/17/2018  . Achilles tendinitis 11/28/2017  . Hot flashes 09/30/2017  . Hypertension 09/08/2017  . Contusion of left knee and lower leg 11/18/2015  . Mild intermittent asthma 08/15/2013  . BV (bacterial vaginosis) 08/11/2012  . Right knee pain 07/05/2012  . GERD (gastroesophageal reflux disease) 07/05/2012  . GC (gonococcus) 05/30/2012  . Chlamydia 05/30/2012  . Left knee pain 01/12/2012  . Encounter for long-term (current) use of high-risk medication 02/25/2011  . Preventative health care 02/14/2011  . VITAMIN D DEFICIENCY 01/30/2010  . ANEMIA-IRON DEFICIENCY 01/30/2010  . Edema 01/30/2010  . HIV (human immunodeficiency  virus infection) ([place]) 03/12/2009  . GENITAL HERPES 03/12/2009  . Anxiety state 11/27/2007  . 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 Hyperlipidemia 08/02/2007    Kelly Rowe, Kelly Rowe 05/14/2020, 4:06 PM  [place], [name], [address] Phone: [phone]   Fax:  [phone]  Name: Kelly Rowe MRN: [id number] Date of Birth: [birth year]  Manus Gunning, PT 05/14/20 4:06 PM

## 2020-05-19 ENCOUNTER — Ambulatory Visit: Payer: Self-pay

## 2020-05-22 ENCOUNTER — Ambulatory Visit: Payer: Self-pay

## 2020-05-22 ENCOUNTER — Other Ambulatory Visit: Payer: Self-pay

## 2020-05-22 DIAGNOSIS — Z483 Aftercare following surgery for neoplasm: Secondary | ICD-10-CM

## 2020-05-22 DIAGNOSIS — Z170 Estrogen receptor positive status [ER+]: Secondary | ICD-10-CM

## 2020-05-22 DIAGNOSIS — M25611 Stiffness of right shoulder, not elsewhere classified: Secondary | ICD-10-CM

## 2020-05-22 DIAGNOSIS — R293 Abnormal posture: Secondary | ICD-10-CM

## 2020-05-22 DIAGNOSIS — C50411 Malignant neoplasm of upper-outer quadrant of right female breast: Secondary | ICD-10-CM

## 2020-05-22 DIAGNOSIS — G8929 Other chronic pain: Secondary | ICD-10-CM

## 2020-05-22 DIAGNOSIS — I890 Lymphedema, not elsewhere classified: Secondary | ICD-10-CM

## 2020-05-22 NOTE — Therapy (Signed)
[place], [name], [address] Phone: [phone]   Fax:  [phone]  Physical Therapy Treatment  Patient Details  Name: Kelly Rowe MRN: [id number] Date of Birth: [birth year] Referring Provider (PT): Dr. Barry Dienes    Encounter Date: 05/22/2020   PT End of Session - 05/22/20 1715    Visit Number 2    Number of Visits 9    Date for PT Re-Evaluation 06/11/20    PT Start Time 1605    PT Stop Time 1657    PT Time Calculation (min) 52 min    Activity Tolerance Patient tolerated treatment well    Behavior During Therapy [facility] for tasks assessed/performed           Past Medical History:  Diagnosis Date  . ANEMIA-IRON DEFICIENCY 01/30/2010  . ANXIETY 11/27/2007  . Arthritis   . Asthma 02/25/2011  . Cancer ([place])    breast  . Carbuncle and furuncle of trunk 04/16/2010  . Chlamydia infection 03/21/2008  . DIABETES MELLITUS, TYPE II 08/02/2007  . Edema 01/30/2010  . ELEVATED BLOOD PRESSURE WITHOUT DIAGNOSIS OF HYPERTENSION 08/02/2007  . Family history of breast cancer   . Family history of colon cancer   . Family history of lung cancer   . FREQUENCY, URINARY 05/19/2009  . GENITAL HERPES 03/12/2009  . GERD (gastroesophageal reflux disease)   . History of kidney stones   . History of radiation therapy 07/27/18- 09/06/18   Right Breast, Right Axillary and Supraclavicular nodes 50 Gy in 25 fractions, Right Breast Boost 10 Gy in 5 fractions  . HIV (human immunodeficiency virus infection) ([place]) 2009  . HIV INFECTION 03/12/2009  . HSV (herpes simplex virus) infection   . HYPERLIPIDEMIA 08/02/2007  . Hypertension   . Metrorrhagia 03/21/2008  . PONV (postoperative nausea and vomiting)    woke up crying per patient   . RETENTION, URINE 05/19/2009  . Sleep apnea   . Trichomonas infection 01/19/2010  . VITAMIN D DEFICIENCY 01/30/2010    Past Surgical History:  Procedure Laterality Date  . ABDOMINAL HYSTERECTOMY   07/22/2010   TAH WITH PRESERVATION OF BOTH TUBES AND OVARIES  . BREAST LUMPECTOMY Right 2019  . BREAST LUMPECTOMY WITH RADIOACTIVE SEED AND SENTINEL LYMPH NODE BIOPSY Right 02/23/2018   Procedure: BREAST LUMPECTOMY WITH RADIOACTIVE SEED AND SENTINEL LYMPH NODE BIOPSY;  Surgeon: Stark Klein, MD;  Location: [place];  Service: General;  Laterality: Right;  . CHOLECYSTECTOMY    . ENDOMETRIAL ABLATION  01/11/2008   HER OPTION  . ESOPHAGOGASTRODUODENOSCOPY    . MULTIPLE TOOTH EXTRACTIONS    . PORT-A-CATH REMOVAL Left 05/08/2019   Procedure: REMOVAL PORT-A-CATH;  Surgeon: Stark Klein, MD;  Location: [place];  Service: General;  Laterality: Left;  . PORTACATH PLACEMENT Left 02/23/2018   Procedure: INSERTION PORT-A-CATH;  Surgeon: Stark Klein, MD;  Location: [facility];  Service: General;  Laterality: Left;  . PORTACATH PLACEMENT N/A 04/21/2018   Procedure: PORT-A-CATH REVISION;  Surgeon: Stark Klein, MD;  [facility];  Service: General;  Laterality: N/A;  . TUBAL LIGATION    . WISDOM TOOTH EXTRACTION      There were no vitals filed for this visit.   Subjective Assessment - 05/22/20 1604    Subjective I do the stretches maybe every 2 days. Having shoulder pain6/10 todat but breast is only a 3/10 today.  Breast still feels firm medially.  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 Chemotherapy completed 07/05/18, Herceptin started with chemo and continues through August of this year.  Radiation 07/27/18-09/06/18 to the Rt breast, Antiestrogens to follow. Other medical history to include asthma, DM, HTN, HIV, and hysterectomy    Patient Stated Goals to get swelling down and be able to reach- wants to be able to wash herself in the shower    Pain Score 6     Pain Location Shoulder    Pain Orientation Right    Pain Descriptors / Indicators Tightness;Aching     Pain Type Chronic pain    Pain Onset More than a month ago    Pain Frequency Constant    Pain Score 3    Pain Location Breast    Pain Orientation Right    Pain Onset More than a month ago    Pain Frequency Constant              [facility] PT Assessment - 05/22/20 0001      Strength   Right Hand Grip (lbs) 16 lbs    Left Hand Grip (lbs) 44.5 lbs                   Outpatient Rehab from 05/14/2020 in [facility]  Lymphedema Life Impact Scale Total Score 89.71 %            [facility] Adult PT Treatment/Exercise - 05/22/20 0001      Manual Therapy   Manual Therapy Edema management;Myofascial release;Manual Lymphatic Drainage (MLD);Passive ROM    Myofascial Release To medial arm area of tenderness    Manual Lymphatic Drainage (MLD) manual lymph drainage to right breast and proximal arm.  Supraclavicular, activated left axillary LN, Right Inguinal LN, Anterior inter axillary pathway, right axilloinguinal pathway, right breast, reworking pathways and right UE proximal arm, then medial to outer arm and lateral  arm reworking pathways and ending with axillary and inguinal LN.   Passive ROM Right shoulder flexion, scaption, abd, ER with VC's to relax                  PT Education - 05/22/20 1714    Education Details Pt advised to perform HEP for ROM daily atleast 2 times to improve her ROM.  Showed how to do supine flexion and supine hands behind head for easier stretch    Person(s) Educated Patient    Methods Explanation;Demonstration    Comprehension Verbalized understanding;Returned demonstration               PT Long Term Goals - 05/22/20 1702      Additional Long Term Goals   Additional Long Term Goals Yes      PT LONG TERM GOAL #8   Title Pt will improve right grip strength (#2 setting) to atleast 34 lbs for improved ability to perform household chores    Baseline 16 lbs    Time 5    Status New    Target Date 06/26/20                  Plan - 05/22/20 1715    Clinical Impression Statement Therapy consisted of Right shoulder PROM, MFR techniques and MLD to right breast and UE as well as grip strength testing and review of HEP.  She is not consistently performing HEP and the need for this was reinforced.  Right grip strength is  significantly ess than left (16lbs versus 44 lbs on left)    Personal Factors and Comorbidities Comorbidity 3+;Time since onset of injury/illness/exacerbation;Fitness    Comorbidities radiation history, lymph node removal, diabetes    Examination-Activity Limitations Sleep;Dressing;Carry;Reach Overhead;Toileting;Lift;Bathing    Examination-Participation Restrictions Occupation;Meal Prep;Community Activity;Cleaning;Shop;Laundry    Stability/Clinical Decision Making Evolving/Moderate complexity    Rehab Potential Good    PT Frequency 2x / week    PT Duration 4 weeks    PT Treatment/Interventions ADLs/Self Care Home Management;Manual lymph drainage;Compression bandaging;Manual techniques;Taping;DME Instruction;Therapeutic exercise;Patient/family education;Passive range of motion;Scar mobilization;Therapeutic activities;Joint Manipulations    PT Next Visit Plan Add grip strength exs, continue shoulder ROM, MLD, MFR techniques prn.  Reinforce patient compliance    Consulted and Agree with Plan of Care Patient           Patient will benefit from skilled therapeutic intervention in order to improve the following deficits and impairments:  Increased edema, Impaired UE functional use, Postural dysfunction, Pain, Decreased knowledge of use of DME, Decreased knowledge of precautions, Decreased scar mobility, Decreased range of motion, Decreased strength, Increased fascial restricitons  Visit Diagnosis: Stiffness of right shoulder, not elsewhere classified  Chronic right shoulder pain  Lymphedema, not elsewhere classified  Abnormal posture  Malignant neoplasm of upper-outer quadrant  of right breast in female, estrogen receptor positive [facility])  Aftercare following surgery for neoplasm     Problem List Patient Active Problem List   Diagnosis Date Noted  . Osteoarthritis of left knee 11/29/2019  . Lymphedema of right arm 10/11/2019  . Upper respiratory infection 07/02/2019  . Type 2 diabetes mellitus with other specified complication ([place]) 28/31/5176  . Hematochezia 03/30/2018  . Insomnia 03/30/2018  . Port-A-Cath in place 03/29/2018  . Genetic testing 01/30/2018  . Neoplasm of breast, regional lymph node staging category N3b: metastasis in ipsilateral internal mammary lymph node and axillary lymph node ([place]) 01/30/2018  . Family history of breast cancer   . Family history of colon cancer   . Family history of lung cancer   . Morbid obesity ([place]) 01/18/2018  . Malignant neoplasm of upper-outer quadrant of right breast in female, estrogen receptor positive ([place]) 01/17/2018  . Achilles tendinitis 11/28/2017  . Hot flashes 09/30/2017  . Hypertension 09/08/2017  . Contusion of left knee and lower leg 11/18/2015  . Mild intermittent asthma 08/15/2013  . BV (bacterial vaginosis) 08/11/2012  . Right knee pain 07/05/2012  . GERD (gastroesophageal reflux disease) 07/05/2012  . GC (gonococcus) 05/30/2012  . Chlamydia 05/30/2012  . Left knee pain 01/12/2012  . Encounter for long-term (current) use of high-risk medication 02/25/2011  . Preventative health care 02/14/2011  . VITAMIN D DEFICIENCY 01/30/2010  . ANEMIA-IRON DEFICIENCY 01/30/2010  . Edema 01/30/2010  . HIV (human immunodeficiency virus infection) ([place]) 03/12/2009  . GENITAL HERPES 03/12/2009  . Anxiety state 11/27/2007  . 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 Hyperlipidemia 08/02/2007    Claris Pong, PT 05/22/2020, 5:19 PM  [place], [name], [address] Phone: [phone]   Fax:  [phone]  Name: Kelly Rowe MRN: [id number] Date of Birth: 04-17-1971

## 2020-05-26 ENCOUNTER — Ambulatory Visit: Payer: Self-pay | Attending: General Surgery | Admitting: Physical Therapy

## 2020-05-26 DIAGNOSIS — Z483 Aftercare following surgery for neoplasm: Secondary | ICD-10-CM | POA: Insufficient documentation

## 2020-05-26 DIAGNOSIS — Z170 Estrogen receptor positive status [ER+]: Secondary | ICD-10-CM | POA: Insufficient documentation

## 2020-05-26 DIAGNOSIS — G8929 Other chronic pain: Secondary | ICD-10-CM | POA: Insufficient documentation

## 2020-05-26 DIAGNOSIS — M25511 Pain in right shoulder: Secondary | ICD-10-CM | POA: Insufficient documentation

## 2020-05-26 DIAGNOSIS — I890 Lymphedema, not elsewhere classified: Secondary | ICD-10-CM | POA: Insufficient documentation

## 2020-05-26 DIAGNOSIS — R293 Abnormal posture: Secondary | ICD-10-CM | POA: Insufficient documentation

## 2020-05-26 DIAGNOSIS — C50411 Malignant neoplasm of upper-outer quadrant of right female breast: Secondary | ICD-10-CM | POA: Insufficient documentation

## 2020-05-26 DIAGNOSIS — M25611 Stiffness of right shoulder, not elsewhere classified: Secondary | ICD-10-CM | POA: Insufficient documentation

## 2020-05-28 ENCOUNTER — Encounter: Payer: Self-pay | Admitting: Physical Therapy

## 2020-05-28 ENCOUNTER — Ambulatory Visit: Payer: Self-pay | Admitting: Physical Therapy

## 2020-05-28 ENCOUNTER — Other Ambulatory Visit: Payer: Self-pay

## 2020-05-28 DIAGNOSIS — R293 Abnormal posture: Secondary | ICD-10-CM

## 2020-05-28 DIAGNOSIS — G8929 Other chronic pain: Secondary | ICD-10-CM

## 2020-05-28 DIAGNOSIS — M25611 Stiffness of right shoulder, not elsewhere classified: Secondary | ICD-10-CM

## 2020-05-28 NOTE — Therapy (Signed)
[place], [name], [address] Phone: [phone]   Fax:  [phone]  Physical Therapy Treatment  Patient Details  Name: Kelly Rowe MRN: [id number] Date of Birth: Jan 05, 1971 Referring Provider (PT): Dr. Barry Dienes    Encounter Date: 05/28/2020   PT End of Session - 05/28/20 1208    Visit Number 3    Number of Visits 9    Date for PT Re-Evaluation 06/11/20    PT Start Time 1107   pt arrived late   PT Stop Time 1200    PT Time Calculation (min) 53 min    Activity Tolerance Patient tolerated treatment well    Behavior During Therapy [facility] for tasks assessed/performed           Past Medical History:  Diagnosis Date  . ANEMIA-IRON DEFICIENCY 01/30/2010  . ANXIETY 11/27/2007  . Arthritis   . Asthma 02/25/2011  . Cancer ([place])    breast  . Carbuncle and furuncle of trunk 04/16/2010  . Chlamydia infection 03/21/2008  . DIABETES MELLITUS, TYPE II 08/02/2007  . Edema 01/30/2010  . ELEVATED BLOOD PRESSURE WITHOUT DIAGNOSIS OF HYPERTENSION 08/02/2007  . Family history of breast cancer   . Family history of colon cancer   . Family history of lung cancer   . FREQUENCY, URINARY 05/19/2009  . GENITAL HERPES 03/12/2009  . GERD (gastroesophageal reflux disease)   . History of kidney stones   . History of radiation therapy 07/27/18- 09/06/18   Right Breast, Right Axillary and Supraclavicular nodes 50 Gy in 25 fractions, Right Breast Boost 10 Gy in 5 fractions  . HIV (human immunodeficiency virus infection) ([place]) 2009  . HIV INFECTION 03/12/2009  . HSV (herpes simplex virus) infection   . HYPERLIPIDEMIA 08/02/2007  . Hypertension   . Metrorrhagia 03/21/2008  . PONV (postoperative nausea and vomiting)    woke up crying per patient   . RETENTION, URINE 05/19/2009  . Sleep apnea   . Trichomonas infection 01/19/2010  . VITAMIN D DEFICIENCY 01/30/2010    Past Surgical History:  Procedure Laterality Date  . ABDOMINAL  HYSTERECTOMY  07/22/2010   TAH WITH PRESERVATION OF BOTH TUBES AND OVARIES  . BREAST LUMPECTOMY Right 2019  . BREAST LUMPECTOMY WITH RADIOACTIVE SEED AND SENTINEL LYMPH NODE BIOPSY Right 02/23/2018   Procedure: BREAST LUMPECTOMY WITH RADIOACTIVE SEED AND SENTINEL LYMPH NODE BIOPSY;  Surgeon: Stark Klein, MD;  Location: [place];  Service: General;  Laterality: Right;  . CHOLECYSTECTOMY    . ENDOMETRIAL ABLATION  01/11/2008   HER OPTION  . ESOPHAGOGASTRODUODENOSCOPY    . MULTIPLE TOOTH EXTRACTIONS    . PORT-A-CATH REMOVAL Left 05/08/2019   Procedure: REMOVAL PORT-A-CATH;  Surgeon: Stark Klein, MD;  Location: [place];  Service: General;  Laterality: Left;  . PORTACATH PLACEMENT Left 02/23/2018   Procedure: INSERTION PORT-A-CATH;  Surgeon: Stark Klein, MD;  Location: [facility];  Service: General;  Laterality: Left;  . PORTACATH PLACEMENT N/A 04/21/2018   Procedure: PORT-A-CATH REVISION;  Surgeon: Stark Klein, MD;  Location[facility];  Service: General;  Laterality: N/A;  . TUBAL LIGATION    . WISDOM TOOTH EXTRACTION      There were no vitals filed for this visit.   Subjective Assessment - 05/28/20 1110    Subjective I missed my  appointment on Monday because I overslept. My breast is doing a little better and so is my shoulder.    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 Chemotherapy completed 07/05/18, Herceptin started with chemo and continues through August of this year.  Radiation 07/27/18-09/06/18 to the Rt breast, Antiestrogens to follow. Other medical history to include asthma, DM, HTN, HIV, and hysterectomy    Patient Stated Goals to get swelling down and be able to reach- wants to be able to wash herself in the shower    Currently in Pain? Yes    Pain Score 2     Pain Location Shoulder    Pain Orientation Right    Pain Descriptors / Indicators Aching              [facility] PT  Assessment - 05/28/20 0001      AROM   Right Shoulder Extension 37 Degrees    Right Shoulder Flexion 130 Degrees    Right Shoulder ABduction 122 Degrees                   [facility]  Lymphedema Life Impact Scale Total Score 89.71 %            [facility] Adult PT Treatment/Exercise - 05/28/20 0001      Manual Therapy   Manual Therapy Muscle Energy Technique;Soft tissue mobilization;Passive ROM    Soft tissue mobilization to insertion of rotator cuff tendons at right upper arm    Passive ROM to right shoulder with prolonged holds in to flexion, abduction and extra time spent on ER    Muscle Energy Technique contract relax with 90 abd shoulder and 90 flex of elbow in to ER                       PT Long Term Goals - 05/22/20 1702      Additional Long Term Goals   Additional Long Term Goals Yes      PT LONG TERM GOAL #8   Title Pt will improve right grip strength (#2 setting) to atleast 34 lbs for improved ability to perform household chores    Baseline 16 lbs    Time 5    Status New    Target Date 06/26/20                 Plan - 05/28/20 1209    Clinical Impression Statement Focused session today on manual therapy to improve R shoulder ROM to allow pt to complete hygeine tasks more easily. Perfomed PROM with prolonged holds and used contract relax to increased R shoulder ER which was extrememly limited. Pt also has increased tenderness to touch at right upper arm in area of rotator cuff tendons so massaged this area. At end of session pt was able to reach backwards with internal rotation to simulate hygiene activities more easily.    PT Frequency 2x / week    PT Duration 4 weeks    PT Treatment/Interventions ADLs/Self Care Home Management;Manual lymph drainage;Compression bandaging;Manual techniques;Taping;DME Instruction;Therapeutic exercise;Patient/family education;Passive range  of motion;Scar mobilization;Therapeutic activities;Joint Manipulations    PT Next Visit Plan begin pulleys and ball, Add grip strength exs, continue shoulder ROM, MLD, MFR techniques prn.  Reinforce patient compliance    Consulted and Agree with Plan of Care Patient           Patient will benefit from skilled therapeutic intervention in order to improve the following deficits and impairments:  Increased edema, Impaired UE functional use, Postural dysfunction, Pain, Decreased knowledge  of use of DME, Decreased knowledge of precautions, Decreased scar mobility, Decreased range of motion, Decreased strength, Increased fascial restricitons  Visit Diagnosis: Stiffness of right shoulder, not elsewhere classified  Chronic right shoulder pain  Abnormal posture     Problem List Patient Active Problem List   Diagnosis Date Noted  . Osteoarthritis of left knee 11/29/2019  . Lymphedema of right arm 10/11/2019  . Upper respiratory infection 07/02/2019  . Type 2 diabetes mellitus with other specified complication ([place]) 53/00/5110  . Hematochezia 03/30/2018  . Insomnia 03/30/2018  . Port-A-Cath in place 03/29/2018  . Genetic testing 01/30/2018  . Neoplasm of breast, regional lymph node staging category N3b: metastasis in ipsilateral internal mammary lymph node and axillary lymph node ([place]) 01/30/2018  . Family history of breast cancer   . Family history of colon cancer   . Family history of lung cancer   . Morbid obesity ([place]) 01/18/2018  . Malignant neoplasm of upper-outer quadrant of right breast in female, estrogen receptor positive ([place]) 01/17/2018  . Achilles tendinitis 11/28/2017  . Hot flashes 09/30/2017  . Hypertension 09/08/2017  . Contusion of left knee and lower leg 11/18/2015  . Mild intermittent asthma 08/15/2013  . BV (bacterial vaginosis) 08/11/2012  . Right knee pain 07/05/2012  . GERD (gastroesophageal reflux disease) 07/05/2012  . GC (gonococcus) 05/30/2012  .  Chlamydia 05/30/2012  . Left knee pain 01/12/2012  . Encounter for long-term (current) use of high-risk medication 02/25/2011  . Preventative health care 02/14/2011  . VITAMIN D DEFICIENCY 01/30/2010  . ANEMIA-IRON DEFICIENCY 01/30/2010  . Edema 01/30/2010  . HIV (human immunodeficiency virus infection) ([place]) 03/12/2009  . GENITAL HERPES 03/12/2009  . Anxiety state 11/27/2007  . 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 Hyperlipidemia 08/02/2007    Allyson Sabal [facility] 05/28/2020, 12:11 PM  [place], [name], [address] Phone: [phone]   Fax:  [phone]  Name: Kelly Rowe MRN: [id number] Date of Birth: December 25, 1970  Manus Gunning, PT 05/28/20 12:11 PM

## 2020-05-29 ENCOUNTER — Other Ambulatory Visit: Payer: Self-pay | Admitting: Oncology

## 2020-05-29 ENCOUNTER — Other Ambulatory Visit (HOSPITAL_COMMUNITY): Payer: Self-pay | Admitting: Dentistry

## 2020-05-29 DIAGNOSIS — Z9889 Other specified postprocedural states: Secondary | ICD-10-CM

## 2020-06-02 ENCOUNTER — Encounter: Payer: Self-pay | Admitting: Physical Therapy

## 2020-06-02 ENCOUNTER — Other Ambulatory Visit: Payer: Self-pay

## 2020-06-02 ENCOUNTER — Ambulatory Visit: Payer: Self-pay | Admitting: Physical Therapy

## 2020-06-02 DIAGNOSIS — M25511 Pain in right shoulder: Secondary | ICD-10-CM

## 2020-06-02 DIAGNOSIS — G8929 Other chronic pain: Secondary | ICD-10-CM

## 2020-06-02 DIAGNOSIS — R293 Abnormal posture: Secondary | ICD-10-CM

## 2020-06-02 DIAGNOSIS — M25611 Stiffness of right shoulder, not elsewhere classified: Secondary | ICD-10-CM

## 2020-06-02 NOTE — Therapy (Signed)
[place], [name], [address] Phone: [phone]   Fax:  [phone]  Physical Therapy Treatment  Patient Details  Name: Kelly Rowe MRN: [id number] Date of Birth: [birth year] Referring Provider (PT): Dr. Barry Dienes    Encounter Date: 06/02/2020   PT End of Session - 06/02/20 0855    Visit Number 4    Number of Visits 9    Date for PT Re-Evaluation 06/11/20    PT Start Time 0814   pt arrived late   PT Stop Time 0853    PT Time Calculation (min) 39 min    Activity Tolerance Patient tolerated treatment well    Behavior During Therapy [facility] for tasks assessed/performed           Past Medical History:  Diagnosis Date  . ANEMIA-IRON DEFICIENCY 01/30/2010  . ANXIETY 11/27/2007  . Arthritis   . Asthma 02/25/2011  . Cancer ([place])    breast  . Carbuncle and furuncle of trunk 04/16/2010  . Chlamydia infection 03/21/2008  . DIABETES MELLITUS, TYPE II 08/02/2007  . Edema 01/30/2010  . ELEVATED BLOOD PRESSURE WITHOUT DIAGNOSIS OF HYPERTENSION 08/02/2007  . Family history of breast cancer   . Family history of colon cancer   . Family history of lung cancer   . FREQUENCY, URINARY 05/19/2009  . GENITAL HERPES 03/12/2009  . GERD (gastroesophageal reflux disease)   . History of kidney stones   . History of radiation therapy 07/27/18- 09/06/18   Right Breast, Right Axillary and Supraclavicular nodes 50 Gy in 25 fractions, Right Breast Boost 10 Gy in 5 fractions  . HIV (human immunodeficiency virus infection) ([place]) 2009  . HIV INFECTION 03/12/2009  . HSV (herpes simplex virus) infection   . HYPERLIPIDEMIA 08/02/2007  . Hypertension   . Metrorrhagia 03/21/2008  . PONV (postoperative nausea and vomiting)    woke up crying per patient   . RETENTION, URINE 05/19/2009  . Sleep apnea   . Trichomonas infection 01/19/2010  . VITAMIN D DEFICIENCY 01/30/2010    Past Surgical History:  Procedure Laterality Date  . ABDOMINAL  HYSTERECTOMY  07/22/2010   TAH WITH PRESERVATION OF BOTH TUBES AND OVARIES  . BREAST LUMPECTOMY Right 2019  . BREAST LUMPECTOMY WITH RADIOACTIVE SEED AND SENTINEL LYMPH NODE BIOPSY Right 02/23/2018   Procedure: BREAST LUMPECTOMY WITH RADIOACTIVE SEED AND SENTINEL LYMPH NODE BIOPSY;  Surgeon: Stark Klein, MD;  Location: [facility];  Service: General;  Laterality: Right;  . CHOLECYSTECTOMY    . ENDOMETRIAL ABLATION  01/11/2008   HER OPTION  . ESOPHAGOGASTRODUODENOSCOPY    . MULTIPLE TOOTH EXTRACTIONS    . PORT-A-CATH REMOVAL Left 05/08/2019   Procedure: REMOVAL PORT-A-CATH;  Surgeon: Stark Klein, MD;  Location: [facility];  Service: General;  Laterality: Left;  . PORTACATH PLACEMENT Left 02/23/2018   Procedure: INSERTION PORT-A-CATH;  Surgeon: Stark Klein, MD;  Location: [facility];  Service: General;  Laterality: Left;  . PORTACATH PLACEMENT N/A 04/21/2018   Procedure: PORT-A-CATH REVISION;  Surgeon: Stark Klein, MD;  [facility];  Service: General;  Laterality: N/A;  . TUBAL LIGATION    . WISDOM TOOTH EXTRACTION      There were no vitals filed for this visit.   Subjective Assessment - 06/02/20 0815    Subjective My shoulder is doing a little bit better. I have been stretching it.    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 Chemotherapy completed 07/05/18, Herceptin started with chemo and continues through August of this year.  Radiation 07/27/18-09/06/18 to the Rt breast, Antiestrogens to follow. Other medical history to include asthma, DM, HTN, HIV, and hysterectomy    Patient Stated Goals to get swelling down and be able to reach- wants to be able to wash herself in the shower    Currently in Pain? Yes    Pain Score 2     Pain Location Shoulder    Pain Orientation Right    Pain Descriptors / Indicators Aching    Pain Onset More than a month ago              [facility] PT Assessment -  06/02/20 0001      AROM   Right Shoulder Extension 49 Degrees   after PROM 54   Right Shoulder Flexion 136 Degrees   after PROM 148   Right Shoulder ABduction 168 Degrees   after PROM 150 but pure abduction                  Outpatient Rehab from 05/14/2020 in [place]  Lymphedema Life Impact Scale Total Score 89.71 %            [facility] Adult PT Treatment/Exercise - 06/02/20 0001      Manual Therapy   Soft tissue mobilization to insertion of rotator cuff tendons at right upper arm    Passive ROM to right shoulder with prolonged holds in to flexion, abduction and extra time spent on ER and IR                       PT Long Term Goals - 06/02/20 0858      PT LONG TERM GOAL #1   Title Pt will demonstrate 140 degrees of R shoulder abduction to allow her to reach out to the side    Baseline 91    Time 4    Period Weeks    Status New      PT LONG TERM GOAL #2   Title Pt will demonstrate 150 degrees of right shoulder flexion to allow her to reach items on the shelf    Baseline 127    Time 4    Period Weeks    Status New      PT LONG TERM GOAL #3   Title Pt will demonstrate 40 degrees of R shoulder IR to allow her to reach back to fasten her bra and perform hygeine    Time 4    Period Weeks    Status New      PT LONG TERM GOAL #4   Title Pt will be able to open a jar or a can without assistance from her son to decrease dependence on caregivers.    Time 4    Period Weeks    Status New      PT LONG TERM GOAL #5   Title Pt will receive a compression bra, sleeve and glove for long term management of lymphedema.    Time 4    Period Weeks    Status New      PT LONG TERM GOAL #6   Title Pt will be independent in a home exercise program for strengthening and stretching    Time 4    Period Weeks    Status New      PT LONG TERM GOAL #7   Title Pt will  demosntrate a 50% improvement in fibrosis present throughout R  breast to decrease risk of cellulitis    Time 4    Period Weeks    Status New      PT LONG TERM GOAL #8   Title Pt will improve right grip strength (#2 setting) to atleast 34 lbs for improved ability to perform household chores    Baseline 16 lbs    Time 5    Status New                 Plan - 06/02/20 0855    Clinical Impression Statement Remeasured AROM at beginning fo session today and it has improved greatly since 05/28/20. Continued with PROM today with prolonged holds and STM to R upper arm. Pt reports she has been able to reach back to perform self hygeine my easily. Remeasured ROM at end of session and it had improved more.    PT Frequency 2x / week    PT Duration 4 weeks    PT Treatment/Interventions ADLs/Self Care Home Management;Manual lymph drainage;Compression bandaging;Manual techniques;Taping;DME Instruction;Therapeutic exercise;Patient/family education;Passive range of motion;Scar mobilization;Therapeutic activities;Joint Manipulations    PT Next Visit Plan begin pulleys and ball, Add grip strength exs, continue shoulder ROM, MLD, MFR techniques prn.  Reinforce patient compliance    Consulted and Agree with Plan of Care Patient           Patient will benefit from skilled therapeutic intervention in order to improve the following deficits and impairments:  Increased edema, Impaired UE functional use, Postural dysfunction, Pain, Decreased knowledge of use of DME, Decreased knowledge of precautions, Decreased scar mobility, Decreased range of motion, Decreased strength, Increased fascial restricitons  Visit Diagnosis: Stiffness of right shoulder, not elsewhere classified  Chronic right shoulder pain  Abnormal posture     Problem List Patient Active Problem List   Diagnosis Date Noted  . Osteoarthritis of left knee 11/29/2019  . Lymphedema of right arm 10/11/2019  . Upper respiratory infection 07/02/2019  . Type 2 diabetes mellitus with other specified  complication ([place]) 00/76/2263  . Hematochezia 03/30/2018  . Insomnia 03/30/2018  . Port-A-Cath in place 03/29/2018  . Genetic testing 01/30/2018  . Neoplasm of breast, regional lymph node staging category N3b: metastasis in ipsilateral internal mammary lymph node and axillary lymph node ([place]) 01/30/2018  . Family history of breast cancer   . Family history of colon cancer   . Family history of lung cancer   . Morbid obesity ([place]) 01/18/2018  . Malignant neoplasm of upper-outer quadrant of right breast in female, estrogen receptor positive ([place]) 01/17/2018  . Achilles tendinitis 11/28/2017  . Hot flashes 09/30/2017  . Hypertension 09/08/2017  . Contusion of left knee and lower leg 11/18/2015  . Mild intermittent asthma 08/15/2013  . BV (bacterial vaginosis) 08/11/2012  . Right knee pain 07/05/2012  . GERD (gastroesophageal reflux disease) 07/05/2012  . GC (gonococcus) 05/30/2012  . Chlamydia 05/30/2012  . Left knee pain 01/12/2012  . Encounter for long-term (current) use of high-risk medication 02/25/2011  . Preventative health care 02/14/2011  . VITAMIN D DEFICIENCY 01/30/2010  . ANEMIA-IRON DEFICIENCY 01/30/2010  . Edema 01/30/2010  . HIV (human immunodeficiency virus infection) ([place]) 03/12/2009  . GENITAL HERPES 03/12/2009  . Anxiety state 11/27/2007  . 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 Hyperlipidemia 08/02/2007    Allyson Sabal [place] 06/02/2020, 8:59 AM  [place], [name], [address] Phone: [phone]   Fax:  [phone]  Name: Tameria Patti MRN: [id number] Date of Birth: October 21, 1970  Manus Gunning, PT 06/02/20 8:59 AM

## 2020-06-04 ENCOUNTER — Other Ambulatory Visit: Payer: Self-pay

## 2020-06-04 ENCOUNTER — Ambulatory Visit: Payer: Self-pay | Admitting: Physical Therapy

## 2020-06-04 ENCOUNTER — Encounter: Payer: Self-pay | Admitting: Physical Therapy

## 2020-06-04 DIAGNOSIS — G8929 Other chronic pain: Secondary | ICD-10-CM

## 2020-06-04 DIAGNOSIS — M25611 Stiffness of right shoulder, not elsewhere classified: Secondary | ICD-10-CM

## 2020-06-04 DIAGNOSIS — R293 Abnormal posture: Secondary | ICD-10-CM

## 2020-06-04 NOTE — Therapy (Signed)
[place], [name], [address] Phone: [phone]   Fax:  [phone]  Physical Therapy Treatment  Patient Details  Name: Kelly Rowe MRN: [id number] Date of Birth: 09/12/1970 Referring Provider (PT): Dr. Barry Dienes    Encounter Date: 06/04/2020   PT End of Session - 06/04/20 0854    Visit Number 5    Number of Visits 9    Date for PT Re-Evaluation 06/11/20    PT Start Time 0810   pt arrived late   PT Stop Time 0854    PT Time Calculation (min) 44 min    Activity Tolerance Patient tolerated treatment well    Behavior During Therapy [facility] for tasks assessed/performed           Past Medical History:  Diagnosis Date  . ANEMIA-IRON DEFICIENCY 01/30/2010  . ANXIETY 11/27/2007  . Arthritis   . Asthma 02/25/2011  . Cancer ([place])    breast  . Carbuncle and furuncle of trunk 04/16/2010  . Chlamydia infection 03/21/2008  . DIABETES MELLITUS, TYPE II 08/02/2007  . Edema 01/30/2010  . ELEVATED BLOOD PRESSURE WITHOUT DIAGNOSIS OF HYPERTENSION 08/02/2007  . Family history of breast cancer   . Family history of colon cancer   . Family history of lung cancer   . FREQUENCY, URINARY 05/19/2009  . GENITAL HERPES 03/12/2009  . GERD (gastroesophageal reflux disease)   . History of kidney stones   . History of radiation therapy 07/27/18- 09/06/18   Right Breast, Right Axillary and Supraclavicular nodes 50 Gy in 25 fractions, Right Breast Boost 10 Gy in 5 fractions  . HIV (human immunodeficiency virus infection) ([place]) 2009  . HIV INFECTION 03/12/2009  . HSV (herpes simplex virus) infection   . HYPERLIPIDEMIA 08/02/2007  . Hypertension   . Metrorrhagia 03/21/2008  . PONV (postoperative nausea and vomiting)    woke up crying per patient   . RETENTION, URINE 05/19/2009  . Sleep apnea   . Trichomonas infection 01/19/2010  . VITAMIN D DEFICIENCY 01/30/2010    Past Surgical History:  Procedure Laterality Date  . ABDOMINAL  HYSTERECTOMY  07/22/2010   TAH WITH PRESERVATION OF BOTH TUBES AND OVARIES  . BREAST LUMPECTOMY Right 2019  . BREAST LUMPECTOMY WITH RADIOACTIVE SEED AND SENTINEL LYMPH NODE BIOPSY Right 02/23/2018   Procedure: BREAST LUMPECTOMY WITH RADIOACTIVE SEED AND SENTINEL LYMPH NODE BIOPSY;  Surgeon: Stark Klein, MD;  Location: [facility];  Service: General;  Laterality: Right;  . CHOLECYSTECTOMY    . ENDOMETRIAL ABLATION  01/11/2008   HER OPTION  . ESOPHAGOGASTRODUODENOSCOPY    . MULTIPLE TOOTH EXTRACTIONS    . PORT-A-CATH REMOVAL Left 05/08/2019   Procedure: REMOVAL PORT-A-CATH;  Surgeon: Stark Klein, MD;  Location: [facility];  Service: General;  Laterality: Left;  . PORTACATH PLACEMENT Left 02/23/2018   Procedure: INSERTION PORT-A-CATH;  Surgeon: Stark Klein, MD;  Location: [facility];  Service: General;  Laterality: Left;  . PORTACATH PLACEMENT N/A 04/21/2018   Procedure: PORT-A-CATH REVISION;  Surgeon: Stark Klein, MD;  [facility];  Service: General;  Laterality: N/A;  . TUBAL LIGATION    . WISDOM TOOTH EXTRACTION      There were no vitals filed for this visit.   Subjective Assessment - 06/04/20 0811    Subjective I think my shoulder is actually getting better.    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 Chemotherapy  completed 07/05/18, Herceptin started with chemo and continues through August of this year.  Radiation 07/27/18-09/06/18 to the Rt breast, Antiestrogens to follow. Other medical history to include asthma, DM, HTN, HIV, and hysterectomy    Patient Stated Goals to get swelling down and be able to reach- wants to be able to wash herself in the shower    Currently in Pain? Yes    Pain Score 3     Pain Location Shoulder    Pain Orientation Right    Pain Descriptors / Indicators Aching    Pain Type Chronic pain    Pain Onset More than a month ago    Pain Frequency Constant     Aggravating Factors  stretching    Pain Relieving Factors moving it around                       [facility]patient [facility]  Lymphedema Life Impact Scale Total Score 89.71 %            [facility] Adult PT Treatment/Exercise - 06/04/20 0001      Shoulder Exercises: Pulleys   Flexion 2 minutes    ABduction 2 minutes      Shoulder Exercises: Therapy Ball   Flexion 10 reps   with stretch at end range   ABduction 10 reps;Right   with stretch at end range     Manual Therapy   Soft tissue mobilization to insertion of rotator cuff tendons at right upper arm    Passive ROM to right shoulder with prolonged holds in to flexion, abduction and extra time spent on ER and IR                       PT Long Term Goals - 06/02/20 0858      PT LONG TERM GOAL #1   Title Pt will demonstrate 140 degrees of R shoulder abduction to allow her to reach out to the side    Baseline 91    Time 4    Period Weeks    Status New      PT LONG TERM GOAL #2   Title Pt will demonstrate 150 degrees of right shoulder flexion to allow her to reach items on the shelf    Baseline 127    Time 4    Period Weeks    Status New      PT LONG TERM GOAL #3   Title Pt will demonstrate 40 degrees of R shoulder IR to allow her to reach back to fasten her bra and perform hygeine    Time 4    Period Weeks    Status New      PT LONG TERM GOAL #4   Title Pt will be able to open a jar or a can without assistance from her son to decrease dependence on caregivers.    Time 4    Period Weeks    Status New      PT LONG TERM GOAL #5   Title Pt will receive a compression bra, sleeve and glove for long term management of lymphedema.    Time 4    Period Weeks    Status New      PT LONG TERM GOAL #6   Title Pt will be independent in a home exercise program for strengthening and stretching    Time 4    Period Weeks    Status  New      PT LONG  TERM GOAL #7   Title Pt will demosntrate a 50% improvement in fibrosis present throughout R breast to decrease risk of cellulitis    Time 4    Period Weeks    Status New      PT LONG TERM GOAL #8   Title Pt will improve right grip strength (#2 setting) to atleast 34 lbs for improved ability to perform household chores    Baseline 16 lbs    Time 5    Status New                 Plan - 06/04/20 0857    Clinical Impression Statement Pt states her shoulder is feeling better and she is able to complete hygiene activites with less difficulty. Added AAROM exercises today including pulleys and ball up wall and pt felt a good stretch with these but no pain. Continued with PROM and STM to R upper arm in area of tenderness with trigger points noted. This did improve some today with treatment.    PT Frequency 2x / week    PT Duration 4 weeks    PT Treatment/Interventions ADLs/Self Care Home Management;Manual lymph drainage;Compression bandaging;Manual techniques;Taping;DME Instruction;Therapeutic exercise;Patient/family education;Passive range of motion;Scar mobilization;Therapeutic activities;Joint Manipulations    PT Next Visit Plan cont pulleys and ball, Add grip strength exs, continue shoulder ROM, MLD, MFR techniques prn.  Reinforce patient compliance    Consulted and Agree with Plan of Care Patient           Patient will benefit from skilled therapeutic intervention in order to improve the following deficits and impairments:  Increased edema, Impaired UE functional use, Postural dysfunction, Pain, Decreased knowledge of use of DME, Decreased knowledge of precautions, Decreased scar mobility, Decreased range of motion, Decreased strength, Increased fascial restricitons  Visit Diagnosis: Stiffness of right shoulder, not elsewhere classified  Chronic right shoulder pain  Abnormal posture     Problem List Patient Active Problem List   Diagnosis Date Noted  . Osteoarthritis of  left knee 11/29/2019  . Lymphedema of right arm 10/11/2019  . Upper respiratory infection 07/02/2019  . Type 2 diabetes mellitus with other specified complication ([place]) 07/28/7251  . Hematochezia 03/30/2018  . Insomnia 03/30/2018  . Port-A-Cath in place 03/29/2018  . Genetic testing 01/30/2018  . Neoplasm of breast, regional lymph node staging category N3b: metastasis in ipsilateral internal mammary lymph node and axillary lymph node ([place]) 01/30/2018  . Family history of breast cancer   . Family history of colon cancer   . Family history of lung cancer   . Morbid obesity ([place]) 01/18/2018  . Malignant neoplasm of upper-outer quadrant of right breast in female, estrogen receptor positive ([place]) 01/17/2018  . Achilles tendinitis 11/28/2017  . Hot flashes 09/30/2017  . Hypertension 09/08/2017  . Contusion of left knee and lower leg 11/18/2015  . Mild intermittent asthma 08/15/2013  . BV (bacterial vaginosis) 08/11/2012  . Right knee pain 07/05/2012  . GERD (gastroesophageal reflux disease) 07/05/2012  . GC (gonococcus) 05/30/2012  . Chlamydia 05/30/2012  . Left knee pain 01/12/2012  . Encounter for long-term (current) use of high-risk medication 02/25/2011  . Preventative health care 02/14/2011  . VITAMIN D DEFICIENCY 01/30/2010  . ANEMIA-IRON DEFICIENCY 01/30/2010  . Edema 01/30/2010  . HIV (human immunodeficiency virus infection) ([place]) 03/12/2009  . GENITAL HERPES 03/12/2009  . Anxiety state 11/27/2007  . 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 Hyperlipidemia 08/02/2007    Allyson Sabal [facility] 06/04/2020, 8:59 AM  [place], [name], [address] Phone: [phone]   Fax:  [phone]  Name: Kelly Rowe MRN: [id number] Date of Birth: 03-Jan-1971  Manus Gunning, PT 06/04/20 8:59 AM

## 2020-06-09 ENCOUNTER — Encounter: Payer: Self-pay | Admitting: Physical Therapy

## 2020-06-09 ENCOUNTER — Other Ambulatory Visit: Payer: Self-pay

## 2020-06-09 ENCOUNTER — Ambulatory Visit: Payer: Self-pay | Admitting: Physical Therapy

## 2020-06-09 DIAGNOSIS — M25611 Stiffness of right shoulder, not elsewhere classified: Secondary | ICD-10-CM

## 2020-06-09 DIAGNOSIS — G8929 Other chronic pain: Secondary | ICD-10-CM

## 2020-06-09 DIAGNOSIS — M25511 Pain in right shoulder: Secondary | ICD-10-CM

## 2020-06-09 DIAGNOSIS — I890 Lymphedema, not elsewhere classified: Secondary | ICD-10-CM

## 2020-06-09 NOTE — Therapy (Signed)
[place], [name], [address] Phone: [phone]   Fax:  [phone]  Physical Therapy Treatment  Patient Details  Name: Kelly Rowe MRN: [id number] Date of Birth: 12-Aug-1970 Referring Provider (PT): Dr. Barry Dienes    Encounter Date: 06/09/2020   PT End of Session - 06/09/20 1453    Visit Number 6    Number of Visits 9    Date for PT Re-Evaluation 06/11/20    PT Start Time 3016    PT Stop Time 1454    PT Time Calculation (min) 49 min    Activity Tolerance Patient tolerated treatment well    Behavior During Therapy [facility] for tasks assessed/performed           Past Medical History:  Diagnosis Date  . ANEMIA-IRON DEFICIENCY 01/30/2010  . ANXIETY 11/27/2007  . Arthritis   . Asthma 02/25/2011  . Cancer ([place])    breast  . Carbuncle and furuncle of trunk 04/16/2010  . Chlamydia infection 03/21/2008  . DIABETES MELLITUS, TYPE II 08/02/2007  . Edema 01/30/2010  . ELEVATED BLOOD PRESSURE WITHOUT DIAGNOSIS OF HYPERTENSION 08/02/2007  . Family history of breast cancer   . Family history of colon cancer   . Family history of lung cancer   . FREQUENCY, URINARY 05/19/2009  . GENITAL HERPES 03/12/2009  . GERD (gastroesophageal reflux disease)   . History of kidney stones   . History of radiation therapy 07/27/18- 09/06/18   Right Breast, Right Axillary and Supraclavicular nodes 50 Gy in 25 fractions, Right Breast Boost 10 Gy in 5 fractions  . HIV (human immunodeficiency virus infection) ([place]) 2009  . HIV INFECTION 03/12/2009  . HSV (herpes simplex virus) infection   . HYPERLIPIDEMIA 08/02/2007  . Hypertension   . Metrorrhagia 03/21/2008  . PONV (postoperative nausea and vomiting)    woke up crying per patient   . RETENTION, URINE 05/19/2009  . Sleep apnea   . Trichomonas infection 01/19/2010  . VITAMIN D DEFICIENCY 01/30/2010    Past Surgical History:  Procedure Laterality Date  . ABDOMINAL HYSTERECTOMY   07/22/2010   TAH WITH PRESERVATION OF BOTH TUBES AND OVARIES  . BREAST LUMPECTOMY Right 2019  . BREAST LUMPECTOMY WITH RADIOACTIVE SEED AND SENTINEL LYMPH NODE BIOPSY Right 02/23/2018   Procedure: BREAST LUMPECTOMY WITH RADIOACTIVE SEED AND SENTINEL LYMPH NODE BIOPSY;  Surgeon: Stark Klein, MD;  Location: [facility];  Service: General;  Laterality: Right;  . CHOLECYSTECTOMY    . ENDOMETRIAL ABLATION  01/11/2008   HER OPTION  . ESOPHAGOGASTRODUODENOSCOPY    . MULTIPLE TOOTH EXTRACTIONS    . PORT-A-CATH REMOVAL Left 05/08/2019   Procedure: REMOVAL PORT-A-CATH;  Surgeon: Stark Klein, MD;  Location: [facility];  Service: General;  Laterality: Left;  . PORTACATH PLACEMENT Left 02/23/2018   Procedure: INSERTION PORT-A-CATH;  Surgeon: Stark Klein, MD;  Location: [facility];  Service: General;  Laterality: Left;  . PORTACATH PLACEMENT N/A 04/21/2018   Procedure: PORT-A-CATH REVISION;  Surgeon: Stark Klein, MD;  [facility];  Service: General;  Laterality: N/A;  . TUBAL LIGATION    . WISDOM TOOTH EXTRACTION      There were no vitals filed for this visit.   Subjective Assessment - 06/09/20 1406    Subjective My shoulder feels good.    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 Chemotherapy completed 07/05/18, Herceptin started with chemo and continues  through August of this year.  Radiation 07/27/18-09/06/18 to the Rt breast, Antiestrogens to follow. Other medical history to include asthma, DM, HTN, HIV, and hysterectomy    Patient Stated Goals to get swelling down and be able to reach- wants to be able to wash herself in the shower    Currently in Pain? Yes    Pain Score 3     Pain Location Shoulder    Pain Orientation Right    Pain Descriptors / Indicators Aching    Pain Type Chronic pain    Pain Onset More than a month ago    Pain Frequency Constant    Pain Score 2    Pain Location Breast     Pain Orientation Right    Pain Descriptors / Indicators Aching                       [facility]patient [facility]14/[facility]  Lymphedema Life Impact Scale Total Score 89.71 %            [facility] Adult PT Treatment/Exercise - 06/09/20 0001      Shoulder Exercises: Pulleys   Flexion 2 minutes    ABduction 2 minutes      Shoulder Exercises: Therapy Ball   Flexion 10 reps   with stretch at end range   ABduction 10 reps;Right   with stretch at end range     Manual Therapy   Manual Lymphatic Drainage (MLD) short neck, 5 diaphragmatic breaths, right inguinal nodes and establishment of axillo inguinal pathway, R breast moving fluid towards pathways then retracing all steps    Passive ROM to right shoulder with prolonged holds in to IR and ER                       PT Long Term Goals - 06/02/20 0858      PT LONG TERM GOAL #1   Title Pt will demonstrate 140 degrees of R shoulder abduction to allow her to reach out to the side    Baseline 91    Time 4    Period Weeks    Status New      PT LONG TERM GOAL #2   Title Pt will demonstrate 150 degrees of right shoulder flexion to allow her to reach items on the shelf    Baseline 127    Time 4    Period Weeks    Status New      PT LONG TERM GOAL #3   Title Pt will demonstrate 40 degrees of R shoulder IR to allow her to reach back to fasten her bra and perform hygeine    Time 4    Period Weeks    Status New      PT LONG TERM GOAL #4   Title Pt will be able to open a jar or a can without assistance from her son to decrease dependence on caregivers.    Time 4    Period Weeks    Status New      PT LONG TERM GOAL #5   Title Pt will receive a compression bra, sleeve and glove for long term management of lymphedema.    Time 4    Period Weeks    Status New      PT LONG TERM GOAL #6   Title Pt will be independent in a home exercise program for strengthening and  stretching  Time 4    Period Weeks    Status New      PT LONG TERM GOAL #7   Title Pt will demosntrate a 50% improvement in fibrosis present throughout R breast to decrease risk of cellulitis    Time 4    Period Weeks    Status New      PT LONG TERM GOAL #8   Title Pt will improve right grip strength (#2 setting) to atleast 34 lbs for improved ability to perform household chores    Baseline 16 lbs    Time 5    Status New                 Plan - 06/09/20 1454    Clinical Impression Statement Pt reports her breast is still swollen and she does have some minimal pain in that breast. The medial breast is fibrotic. Encouraged pt to wear compression bra. Her dog messed up her compression bra and she is waiting to purchase another one until she gets insurance. Worked on MLD to R breast today with softening of fibrotic area noted. Continued with AAROM exercises. Pt reports she got 5 lb weigths and has been using them. Educated pt that 5 lbs is too heavy and to decrease to 1 lb.    PT Frequency 2x / week    PT Duration 4 weeks    PT Treatment/Interventions ADLs/Self Care Home Management;Manual lymph drainage;Compression bandaging;Manual techniques;Taping;DME Instruction;Therapeutic exercise;Patient/family education;Passive range of motion;Scar mobilization;Therapeutic activities;Joint Manipulations    PT Next Visit Plan updated goals, give script for bra, cont pulleys and ball, Add grip strength exs, continue shoulder ROM, MLD, MFR techniques prn.  Reinforce patient compliance    Consulted and Agree with Plan of Care Patient           Patient will benefit from skilled therapeutic intervention in order to improve the following deficits and impairments:  Increased edema, Impaired UE functional use, Postural dysfunction, Pain, Decreased knowledge of use of DME, Decreased knowledge of precautions, Decreased scar mobility, Decreased range of motion, Decreased strength, Increased fascial  restricitons  Visit Diagnosis: Stiffness of right shoulder, not elsewhere classified  Lymphedema, not elsewhere classified  Chronic right shoulder pain     Problem List Patient Active Problem List   Diagnosis Date Noted  . Osteoarthritis of left knee 11/29/2019  . Lymphedema of right arm 10/11/2019  . Upper respiratory infection 07/02/2019  . Type 2 diabetes mellitus with other specified complication ([place]) 67/06/4579  . Hematochezia 03/30/2018  . Insomnia 03/30/2018  . Port-A-Cath in place 03/29/2018  . Genetic testing 01/30/2018  . Neoplasm of breast, regional lymph node staging category N3b: metastasis in ipsilateral internal mammary lymph node and axillary lymph node ([place]) 01/30/2018  . Family history of breast cancer   . Family history of colon cancer   . Family history of lung cancer   . Morbid obesity ([place]) 01/18/2018  . Malignant neoplasm of upper-outer quadrant of right breast in female, estrogen receptor positive ([place]) 01/17/2018  . Achilles tendinitis 11/28/2017  . Hot flashes 09/30/2017  . Hypertension 09/08/2017  . Contusion of left knee and lower leg 11/18/2015  . Mild intermittent asthma 08/15/2013  . BV (bacterial vaginosis) 08/11/2012  . Right knee pain 07/05/2012  . GERD (gastroesophageal reflux disease) 07/05/2012  . GC (gonococcus) 05/30/2012  . Chlamydia 05/30/2012  . Left knee pain 01/12/2012  . Encounter for long-term (current) use of high-risk medication 02/25/2011  . Preventative health care 02/14/2011  .  VITAMIN D DEFICIENCY 01/30/2010  . ANEMIA-IRON DEFICIENCY 01/30/2010  . Edema 01/30/2010  . HIV (human immunodeficiency virus infection) ([place]) 03/12/2009  . GENITAL HERPES 03/12/2009  . Anxiety state 11/27/2007  . 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 Hyperlipidemia 08/02/2007    Allyson Sabal  [facility] 06/09/2020, 2:56 PM  [place], [name], [address] Phone: [phone]   Fax:  [phone]  Name: Kelly Rowe MRN: [id number] Date of Birth: [birth year]  Manus Gunning, PT 06/09/20 2:57 PM

## 2020-06-11 ENCOUNTER — Encounter: Payer: Self-pay | Admitting: Physical Therapy

## 2020-06-11 ENCOUNTER — Ambulatory Visit: Payer: Self-pay | Admitting: Physical Therapy

## 2020-06-11 ENCOUNTER — Other Ambulatory Visit: Payer: Self-pay

## 2020-06-11 DIAGNOSIS — G8929 Other chronic pain: Secondary | ICD-10-CM

## 2020-06-11 DIAGNOSIS — Z170 Estrogen receptor positive status [ER+]: Secondary | ICD-10-CM

## 2020-06-11 DIAGNOSIS — R293 Abnormal posture: Secondary | ICD-10-CM

## 2020-06-11 DIAGNOSIS — Z483 Aftercare following surgery for neoplasm: Secondary | ICD-10-CM

## 2020-06-11 DIAGNOSIS — I890 Lymphedema, not elsewhere classified: Secondary | ICD-10-CM

## 2020-06-11 DIAGNOSIS — C50411 Malignant neoplasm of upper-outer quadrant of right female breast: Secondary | ICD-10-CM

## 2020-06-11 DIAGNOSIS — M25611 Stiffness of right shoulder, not elsewhere classified: Secondary | ICD-10-CM

## 2020-06-11 NOTE — Therapy (Signed)
[place], [name], [address] Phone: [phone]   Fax:  [phone]  Physical Therapy Treatment  Patient Details  Name: Kelly Rowe MRN: [id number] Date of Birth: 30-Jun-1971 Referring Provider (PT): Dr. Barry Dienes    Encounter Date: 06/11/2020   PT End of Session - 06/11/20 1459    Visit Number 7    Number of Visits 15    Date for PT Re-Evaluation 07/09/20    PT Start Time 6962    PT Stop Time 1458    PT Time Calculation (min) 55 min    Activity Tolerance Patient tolerated treatment well    Behavior During Therapy [facility] for tasks assessed/performed           Past Medical History:  Diagnosis Date  . ANEMIA-IRON DEFICIENCY 01/30/2010  . ANXIETY 11/27/2007  . Arthritis   . Asthma 02/25/2011  . Cancer ([place])    breast  . Carbuncle and furuncle of trunk 04/16/2010  . Chlamydia infection 03/21/2008  . DIABETES MELLITUS, TYPE II 08/02/2007  . Edema 01/30/2010  . ELEVATED BLOOD PRESSURE WITHOUT DIAGNOSIS OF HYPERTENSION 08/02/2007  . Family history of breast cancer   . Family history of colon cancer   . Family history of lung cancer   . FREQUENCY, URINARY 05/19/2009  . GENITAL HERPES 03/12/2009  . GERD (gastroesophageal reflux disease)   . History of kidney stones   . History of radiation therapy 07/27/18- 09/06/18   Right Breast, Right Axillary and Supraclavicular nodes 50 Gy in 25 fractions, Right Breast Boost 10 Gy in 5 fractions  . HIV (human immunodeficiency virus infection) ([place]) 2009  . HIV INFECTION 03/12/2009  . HSV (herpes simplex virus) infection   . HYPERLIPIDEMIA 08/02/2007  . Hypertension   . Metrorrhagia 03/21/2008  . PONV (postoperative nausea and vomiting)    woke up crying per patient   . RETENTION, URINE 05/19/2009  . Sleep apnea   . Trichomonas infection 01/19/2010  . VITAMIN D DEFICIENCY 01/30/2010    Past Surgical History:  Procedure Laterality Date  . ABDOMINAL HYSTERECTOMY   07/22/2010   TAH WITH PRESERVATION OF BOTH TUBES AND OVARIES  . BREAST LUMPECTOMY Right 2019  . BREAST LUMPECTOMY WITH RADIOACTIVE SEED AND SENTINEL LYMPH NODE BIOPSY Right 02/23/2018   Procedure: BREAST LUMPECTOMY WITH RADIOACTIVE SEED AND SENTINEL LYMPH NODE BIOPSY;  Surgeon: Stark Klein, MD;  Location: [facility];  Service: General;  Laterality: Right;  . CHOLECYSTECTOMY    . ENDOMETRIAL ABLATION  01/11/2008   HER OPTION  . ESOPHAGOGASTRODUODENOSCOPY    . MULTIPLE TOOTH EXTRACTIONS    . PORT-A-CATH REMOVAL Left 05/08/2019   Procedure: REMOVAL PORT-A-CATH;  Surgeon: Stark Klein, MD;  Location: [facility];  Service: General;  Laterality: Left;  . PORTACATH PLACEMENT Left 02/23/2018   Procedure: INSERTION PORT-A-CATH;  Surgeon: Stark Klein, MD;  Location: [facility];  Service: General;  Laterality: Left;  . PORTACATH PLACEMENT N/A 04/21/2018   Procedure: PORT-A-CATH REVISION;  Surgeon: Stark Klein, MD;  Location: [facility];  Service: General;  Laterality: N/A;  . TUBAL LIGATION    . WISDOM TOOTH EXTRACTION      There were no vitals filed for this visit.   Subjective Assessment - 06/11/20 1415    Subjective I am so tired today.    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 Chemotherapy completed 07/05/18, Herceptin started with chemo and  continues through August of this year.  Radiation 07/27/18-09/06/18 to the Rt breast, Antiestrogens to follow. Other medical history to include asthma, DM, HTN, HIV, and hysterectomy    Patient Stated Goals to get swelling down and be able to reach- wants to be able to wash herself in the shower    Currently in Pain? Yes    Pain Score 5     Pain Location Breast    Pain Orientation Right    Pain Descriptors / Indicators Aching    Pain Type Chronic pain    Pain Onset More than a month ago                       Outpatient Rehab from [facility]/14/2020 in  [facility]  Lymphedema Life Impact Scale Total Score 89.71 %            [facility] Adult PT Treatment/Exercise - 06/11/20 0001      Shoulder Exercises: Pulleys   Flexion 2 minutes    ABduction 2 minutes      Shoulder Exercises: Therapy Ball   Flexion 10 reps   with stretch at end range   ABduction 10 reps;Right   with stretch at end range     Manual Therapy   Manual Lymphatic Drainage (MLD) short neck, 5 diaphragmatic breaths, right inguinal nodes and establishment of axillo inguinal pathway, R breast moving fluid towards pathways then retracing all steps                       PT Long Term Goals - 06/11/20 1426      PT LONG TERM GOAL #1   Title Pt will demonstrate 140 degrees of R shoulder abduction to allow her to reach out to the side    Baseline 91; 06/11/20- 147    Time 4    Period Weeks    Status Achieved      PT LONG TERM GOAL #2   Title Pt will demonstrate 150 degrees of right shoulder flexion to allow her to reach items on the shelf    Baseline 127; 06/11/20- 127    Time 4    Period Weeks    Status On-going      PT LONG TERM GOAL #3   Title Pt will demonstrate 40 degrees of R shoulder IR to allow her to reach back to fasten her bra and perform hygeine    Baseline 19; 06/11/20- 34    Time 4    Period Weeks    Status On-going      PT LONG TERM GOAL #4   Title Pt will be able to open a jar or a can without assistance from her son to decrease dependence on caregivers.    Time 4    Period Weeks    Status On-going      PT LONG TERM GOAL #5   Title Pt will receive a compression bra, sleeve and glove for long term management of lymphedema.    Baseline 06/11/20- pt signed up to be measured 12/7    Time 4    Period Weeks    Status On-going      PT LONG TERM GOAL #6   Title Pt will be independent in a home exercise program for strengthening and stretching    Time 4    Period Weeks    Status On-going      PT  LONG TERM GOAL #7  Title Pt will demosntrate a 50% improvement in fibrosis present throughout R breast to decrease risk of cellulitis    Time 4    Period Weeks    Status On-going      PT LONG TERM GOAL #8   Title Pt will improve right grip strength (#2 setting) to atleast 34 lbs for improved ability to perform household chores    Baseline 16 lbs; 06/11/20- 30lbs    Time 5    Period Weeks    Status On-going                 Plan - 06/11/20 1501    Clinical Impression Statement Assessed pt's progress towards goals in therapy. She has made tremendous progress with internal rotation and flexion. She has met her R shoulder flexion goal. She is progress towards all other goals. SIgned pt up to get measured for compression garments paid for by [employer] in 12/7 (bra, sleeve and glove). Pt's grip strength has also improved greatly. Continued with MLD today to R breast since pt has been having pain in the breast recently. Pt would benefit from additional skilled PT services to continue to improve R shoulder ROM and decrease R breast and UE lymphedema and progress pt towards independence with a home exercise program. Will add ionto to this plan of care to address pain at upper R arm.    PT Frequency 2x / week    PT Duration 4 weeks    PT Treatment/Interventions ADLs/Self Care Home Management;Manual lymph drainage;Compression bandaging;Manual techniques;Taping;DME Instruction;Therapeutic exercise;Patient/family education;Passive range of motion;Scar mobilization;Therapeutic activities;Joint Manipulations;Iontophoresis 10m/ml Dexamethasone    PT Next Visit Plan check for  script for bra and put with [employer] form at back of schedule for [employer], begin ionto is cert is signed,  cont pulleys and ball, Add grip strength exs, continue shoulder ROM, MLD, MFR techniques prn.  Reinforce patient compliance    Consulted and Agree with Plan of Care Patient           Patient will benefit from skilled  therapeutic intervention in order to improve the following deficits and impairments:  Increased edema, Impaired UE functional use, Postural dysfunction, Pain, Decreased knowledge of use of DME, Decreased knowledge of precautions, Decreased scar mobility, Decreased range of motion, Decreased strength, Increased fascial restricitons  Visit Diagnosis: Lymphedema, not elsewhere classified - Plan: PT plan of care cert/re-cert  Stiffness of right shoulder, not elsewhere classified - Plan: PT plan of care cert/re-cert  Chronic right shoulder pain - Plan: PT plan of care cert/re-cert  Abnormal posture - Plan: PT plan of care cert/re-cert  Aftercare following surgery for neoplasm - Plan: PT plan of care cert/re-cert  Malignant neoplasm of upper-outer quadrant of right breast in female, estrogen receptor positive ([place] - Plan: PT plan of care cert/re-cert     Problem List Patient Active Problem List   Diagnosis Date Noted  . Osteoarthritis of left knee 11/29/2019  . Lymphedema of right arm 10/11/2019  . Upper respiratory infection 07/02/2019  . Type 2 diabetes mellitus with other specified complication ([place] 026/71/2458 . Hematochezia 03/30/2018  . Insomnia 03/30/2018  . Port-A-Cath in place 03/29/2018  . Genetic testing 01/30/2018  . Neoplasm of breast, regional lymph node staging category N3b: metastasis in ipsilateral internal mammary lymph node and axillary lymph node ([place] 01/30/2018  . Family history of breast cancer   . Family history of colon cancer   . Family history of lung cancer   . Morbid obesity ([place]  01/18/2018  . Malignant neoplasm of upper-outer quadrant of right breast in female, estrogen receptor positive ([place]) 01/17/2018  . Achilles tendinitis 11/28/2017  . Hot flashes 09/30/2017  . Hypertension 09/08/2017  . Contusion of left knee and lower leg 11/18/2015  . Mild intermittent asthma 08/15/2013  . BV (bacterial vaginosis) 08/11/2012  . Right knee pain 07/05/2012  .  GERD (gastroesophageal reflux disease) 07/05/2012  . GC (gonococcus) 05/30/2012  . Chlamydia 05/30/2012  . Left knee pain 01/12/2012  . Encounter for long-term (current) use of high-risk medication 02/25/2011  . Preventative health care 02/14/2011  . VITAMIN D DEFICIENCY 01/30/2010  . ANEMIA-IRON DEFICIENCY 01/30/2010  . Edema 01/30/2010  . HIV (human immunodeficiency virus infection) ([place]) 03/12/2009  . GENITAL HERPES 03/12/2009  . Anxiety state 11/27/2007  . 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 Hyperlipidemia 08/02/2007    Allyson Sabal [facility] 06/11/2020, 3:55 PM  [place], [name], [address] Phone: [phone]   Fax:  [phone]  Name: Kelly Rowe MRN: [id number] Date of Birth: 1971-02-10  Manus Gunning, PT 06/11/20 3:56 PM

## 2020-06-18 ENCOUNTER — Inpatient Hospital Stay: Payer: Self-pay

## 2020-06-18 ENCOUNTER — Telehealth: Payer: Self-pay

## 2020-06-18 ENCOUNTER — Other Ambulatory Visit: Payer: Self-pay

## 2020-06-18 ENCOUNTER — Inpatient Hospital Stay: Payer: Self-pay | Attending: Adult Health | Admitting: Adult Health

## 2020-06-18 ENCOUNTER — Encounter: Payer: Self-pay | Admitting: Adult Health

## 2020-06-18 VITALS — BP 126/73 | HR 91 | Temp 98.3°F | Resp 18 | Ht 62.0 in | Wt 269.4 lb

## 2020-06-18 DIAGNOSIS — Z800 Family history of malignant neoplasm of digestive organs: Secondary | ICD-10-CM | POA: Insufficient documentation

## 2020-06-18 DIAGNOSIS — C50411 Malignant neoplasm of upper-outer quadrant of right female breast: Secondary | ICD-10-CM | POA: Insufficient documentation

## 2020-06-18 DIAGNOSIS — Z803 Family history of malignant neoplasm of breast: Secondary | ICD-10-CM | POA: Insufficient documentation

## 2020-06-18 DIAGNOSIS — Z170 Estrogen receptor positive status [ER+]: Secondary | ICD-10-CM

## 2020-06-18 DIAGNOSIS — Z923 Personal history of irradiation: Secondary | ICD-10-CM | POA: Insufficient documentation

## 2020-06-18 DIAGNOSIS — R232 Flushing: Secondary | ICD-10-CM | POA: Insufficient documentation

## 2020-06-18 DIAGNOSIS — Z8042 Family history of malignant neoplasm of prostate: Secondary | ICD-10-CM | POA: Insufficient documentation

## 2020-06-18 DIAGNOSIS — Z801 Family history of malignant neoplasm of trachea, bronchus and lung: Secondary | ICD-10-CM | POA: Insufficient documentation

## 2020-06-18 DIAGNOSIS — Z79810 Long term (current) use of selective estrogen receptor modulators (SERMs): Secondary | ICD-10-CM | POA: Insufficient documentation

## 2020-06-18 DIAGNOSIS — Z90722 Acquired absence of ovaries, bilateral: Secondary | ICD-10-CM | POA: Insufficient documentation

## 2020-06-18 DIAGNOSIS — Z90710 Acquired absence of both cervix and uterus: Secondary | ICD-10-CM | POA: Insufficient documentation

## 2020-06-18 LAB — CMP (CANCER CENTER ONLY)
ALT: 26 U/L (ref 0–44)
AST: 29 U/L (ref 15–41)
Albumin: 4 g/dL (ref 3.5–5.0)
Alkaline Phosphatase: 120 U/L (ref 38–126)
Anion gap: 16 — ABNORMAL HIGH (ref 5–15)
BUN: 16 mg/dL (ref 6–20)
CO2: 20 mmol/L — ABNORMAL LOW (ref 22–32)
Calcium: 9.5 mg/dL (ref 8.9–10.3)
Chloride: 101 mmol/L (ref 98–111)
Creatinine: 1.04 mg/dL — ABNORMAL HIGH (ref 0.44–1.00)
GFR, Estimated: >60 mL/min (ref >60)
Glucose, Bld: 211 mg/dL — ABNORMAL HIGH (ref 70–99)
Potassium: 4.5 mmol/L (ref 3.5–5.1)
Sodium: 137 mmol/L (ref 135–145)
Total Bilirubin: 0.5 mg/dL (ref 0.3–1.2)
Total Protein: 7.8 g/dL (ref 6.5–8.1)

## 2020-06-18 LAB — CBC WITH DIFFERENTIAL (CANCER CENTER ONLY)
Abs Immature Granulocytes: 0.03 10*3/uL (ref 0.00–0.07)
Basophils Absolute: 0 10*3/uL (ref 0.0–0.1)
Basophils Relative: 0 %
Eosinophils Absolute: 0 10*3/uL (ref 0.0–0.5)
Eosinophils Relative: 0 %
HCT: 44 % (ref 36.0–46.0)
Hemoglobin: 14.1 g/dL (ref 12.0–15.0)
Immature Granulocytes: 1 %
Lymphocytes Relative: 34 %
Lymphs Abs: 1.5 10*3/uL (ref 0.7–4.0)
MCH: 27.7 pg (ref 26.0–34.0)
MCHC: 32 g/dL (ref 30.0–36.0)
MCV: 86.4 fL (ref 80.0–100.0)
Monocytes Absolute: 0.3 10*3/uL (ref 0.1–1.0)
Monocytes Relative: 8 %
Neutro Abs: 2.5 10*3/uL (ref 1.7–7.7)
Neutrophils Relative %: 57 %
Platelet Count: 205 10*3/uL (ref 150–400)
RBC: 5.09 MIL/uL (ref 3.87–5.11)
RDW: 13.8 % (ref 11.5–15.5)
WBC Count: 4.4 10*3/uL (ref 4.0–10.5)
nRBC: 0 % (ref 0.0–0.2)

## 2020-06-18 LAB — URINALYSIS, COMPLETE (UACMP) WITH MICROSCOPIC
Bacteria, UA: NONE SEEN
Bilirubin Urine: NEGATIVE
Glucose, UA: >=500 mg/dL — AB
Hgb urine dipstick: NEGATIVE
Ketones, ur: 80 mg/dL — AB
Leukocytes,Ua: NEGATIVE
Nitrite: NEGATIVE
Protein, ur: NEGATIVE mg/dL
Specific Gravity, Urine: 1.029 (ref 1.005–1.030)
pH: 5 (ref 5.0–8.0)

## 2020-06-18 MED ORDER — FLUCONAZOLE 150 MG PO TABS: 150.0000 mg | ORAL_TABLET | Freq: Once | ORAL | 0 refills | Status: AC

## 2020-06-18 NOTE — Progress Notes (Signed)
[facility]  [phone] [phone]) [phone]    ID: Kelly Rowe DOB: 30-Oct-1970  MR#: [id number]  UXL#:[id number]  Patient Care Team: Biagio Borg, MD as PCP - [name], MD as Consulting Physician (General Surgery) [name], [name], MD as Consulting Physician (Oncology) [name], MD as Attending Physician (Radiation Oncology) Marlinda Mike, PA-C as Referring Physician (Physician Assistant) Gregor Hams, MD as Consulting Physician (Family Medicine) OTHER MD:   CHIEF COMPLAINT: Triple positive breast cancer  CURRENT TREATMENT:  tamoxifen   INTERVAL HISTORY: Kelly Rowe returns today for follow-up of her triple positive breast cancer.  She continues on Tamoxifen with good tolerance.  She has minimal issues with tolerating this aside from hot flashes that she is managing.    Her most recent mammogram was a  bilateral diagnostic mammography with tomography at [facility] on 06/27/2019 showing: breast density category B; no evidence of malignancy in either breast.   REVIEW OF SYSTEMS: Kelly Rowe was recently started on clindamycin for a dental abscess.  She has subsequently developed a yeast infection and requests that I call her in some Diflucan.  She also notes that she has had a series of elevated blood sugars recently and has been working on getting those back down.     She denies any bowel/bladder issues, fever, chills, nausea, vomiting, cough, shortness of breath, chest pain, or palpitations.  A detailed ROS was otherwise non contributory.     HISTORY OF CURRENT ILLNESS: From the original intake note:  "Kelly Rowe" noticed bruising in her lateral right breast and palpated a mass  on 12/23/2017. She followed up with her gynecologist. She underwent unilateral right diagnostic mammography with tomography and right breast ultrasonography at [facility] on 01/05/2018 showing: breast density category B. There is an  irregular highly suspicious mass within the right breast at the 9:30 o'clock upper outer quadrant, measuring 1.8 x 1.1 x 1.5 cm, and located 18 cm from the nipple,  Ultrasonography revealed a single morphologically abnormal lymph node in the RIGHT axilla, with cortical thickness of 6 mm.   Accordingly on 01/11/2018 she proceeded to biopsy of the right breast area in question as well as a suspicious lymph node. The pathology from this procedure showed ([id number]): Invasive ductal carcinoma grade III.  The lymph node biopsied was negative for carcinoma (concordant).. Prognostic indicators significant for: estrogen receptor, 60% positive with weak staining intensity and progesterone receptor, 10% positive with strong staining intensity. Proliferation marker Ki67 at 30%. HER2 amplified with ratios HER2/CEP17 signals 2.26 and average HER2 copies per cell 3.50  The patient's subsequent history is as detailed below.   PAST MEDICAL HISTORY: Past Medical History:  Diagnosis Date  . ANEMIA-IRON DEFICIENCY 01/30/2010  . ANXIETY 11/27/2007  . Arthritis   . Asthma 02/25/2011  . Cancer ([place])    breast  . Carbuncle and furuncle of trunk 04/16/2010  . Chlamydia infection 03/21/2008  . DIABETES MELLITUS, TYPE II 08/02/2007  . Edema 01/30/2010  . ELEVATED BLOOD PRESSURE WITHOUT DIAGNOSIS OF HYPERTENSION 08/02/2007  . Family history of breast cancer   . Family history of colon cancer   . Family history of lung cancer   . FREQUENCY, URINARY 05/19/2009  . GENITAL HERPES 03/12/2009  . GERD (gastroesophageal reflux disease)   . History of kidney stones   . History of radiation therapy 07/27/18- 09/06/18   Right Breast, Right Axillary and Supraclavicular nodes 50 Gy in 25 fractions, Right Breast Boost 10 Gy in 5 fractions  .  HIV (human immunodeficiency virus infection) ([place]) 2009  . HIV INFECTION 03/12/2009  . HSV (herpes simplex virus) infection   . HYPERLIPIDEMIA 08/02/2007  . Hypertension   . Metrorrhagia 03/21/2008  .  PONV (postoperative nausea and vomiting)    woke up crying per patient   . RETENTION, URINE 05/19/2009  . Sleep apnea   . Trichomonas infection 01/19/2010  . VITAMIN D DEFICIENCY 01/30/2010    PAST SURGICAL HISTORY: Past Surgical History:  Procedure Laterality Date  . ABDOMINAL HYSTERECTOMY  07/22/2010   TAH WITH PRESERVATION OF BOTH TUBES AND OVARIES  . BREAST LUMPECTOMY Right 2019  . BREAST LUMPECTOMY WITH RADIOACTIVE SEED AND SENTINEL LYMPH NODE BIOPSY Right 02/23/2018   Procedure: BREAST LUMPECTOMY WITH RADIOACTIVE SEED AND SENTINEL LYMPH NODE BIOPSY;  Surgeon: Stark Klein, MD;  Location: [facility];  Service: General;  Laterality: Right;  . CHOLECYSTECTOMY    . ENDOMETRIAL ABLATION  01/11/2008   HER OPTION  . ESOPHAGOGASTRODUODENOSCOPY    . MULTIPLE TOOTH EXTRACTIONS    . PORT-A-CATH REMOVAL Left 05/08/2019   Procedure: REMOVAL PORT-A-CATH;  Surgeon: Stark Klein, MD;  Location: [facility];  Service: General;  Laterality: Left;  . PORTACATH PLACEMENT Left 02/23/2018   Procedure: INSERTION PORT-A-CATH;  Surgeon: Stark Klein, MD;  Location: [facility];  Service: General;  Laterality: Left;  . PORTACATH PLACEMENT N/A 04/21/2018   Procedure: PORT-A-CATH REVISION;  Surgeon: Stark Klein, MD;  Location: [facility];  Service: General;  Laterality: N/A;  . TUBAL LIGATION    . WISDOM TOOTH EXTRACTION      FAMILY HISTORY: Family History  Problem Relation Age of Onset  . Prostate cancer Other   . Heart disease Other   . Stroke Other   . Diabetes Mother   . Hypertension Mother   . Diabetes Father   . Cancer Father        COLON and LU NG  . Colon cancer Father   . Stroke Paternal Uncle   . Breast cancer Paternal Aunt   . Lung cancer Maternal Grandmother 91       Mesothelioma  . Colon cancer Paternal Grandfather        dx over 60s  . Lung cancer Paternal Aunt   . Breast cancer Paternal Aunt   . Breast cancer Paternal Aunt   . Heart attack Maternal Grandfather 71  . Colon  cancer Other        MGM's 5 brothers   The patient's father died at age 52 due to metastatic liver cancer. The patient's mother is alive at 79. The patient's has 1 brother and 3 sisters. There was a paternal grandfather with colon cancer. There were 3 paternal aunts with breast cancer, 2 diagnosed in the 56's and 1 at age 62. There was a maternal grandmother with mesothelioma. The patient otherwise denies a history of ovarian cancer in the family.    GYNECOLOGIC HISTORY:  Patient's last menstrual period was 06/21/2010. Menarche: 49 years old Age at first live birth: 49 years old She is GXP5. Her LMP was December 2011. She is status post partial hysterectomy without oophorectomy. She took oral contraception for 3 years with no complications. She never took HRT.    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  At home are 2 of her sons, Glendell Docker and [name], and the patient's grandson, Amador Cunas 31 (who is Willie's son). The patient's oldest is [place] age 69 who lives in [place] as a cook. Daughter, [place]  age 11 works as a Scientist, water quality. Son, Glendell Docker age 50 is disabled. Son, [name] age 77 lives in [place] as a Microbiologist. Daughter, Benard Rink age 71 also is a Microbiologist. The patient has 5 grandchildren and no great grandchildren. The patient does not belong to a church.    ADVANCED DIRECTIVES: Not in place; at the 01/18/2018 visit the patient was given the appropriate documents to complete on notarized at her discretion   HEALTH MAINTENANCE: Social History   Tobacco Use  . Smoking status: Never Smoker  . Smokeless tobacco: Never Used  Vaping Use  . Vaping Use: Never used  Substance Use Topics  . Alcohol use: Yes    Alcohol/week: 0.0 standard drinks    Comment: occ  . Drug use: No     Colonoscopy: Not yet  PAP: November 2018  Bone density: Never   Allergies  Allergen Reactions  . Metformin And Related Other (See Comments)    Bowel frequency  . Levaquin [Levofloxacin In D5w]  Nausea And Vomiting and Rash  . Penicillins Swelling and Rash    Has patient had a PCN reaction causing immediate rash, facial/tongue/throat swelling, SOB or lightheadedness with hypotension: Yes Has patient had a PCN reaction causing severe rash involving mucus membranes or skin necrosis: No Has patient had a PCN reaction that required hospitalization: Yes Has patient had a PCN reaction occurring within the last 10 years: No If all of the above answers are "NO", then may proceed with Cephalosporin use.    Current Outpatient Medications  Medication Sig Dispense Refill  . albuterol (PROVENTIL HFA;VENTOLIN HFA) 108 (90 Base) MCG/ACT inhaler Inhale 2 puffs into the lungs every 6 (six) hours as needed for wheezing or shortness of breath.    . bictegravir-emtricitabine-tenofovir AF (BIKTARVY) 50-200-25 MG TABS tablet Take 1 tablet by mouth daily.    . blood glucose meter kit and supplies Dispense based on patient and insurance preference. Use up to four times daily as directed. (FOR ICD-10 E10.9, E11.9). 1 each 0  . colchicine 0.6 MG tablet Take 1 tablet (0.6 mg total) by mouth daily. 30 tablet 2  . Diclofenac Sodium (PENNSAID) 2 % SOLN Place 1 application onto the skin 2 (two) times daily. 112 g 2  . Dulaglutide (TRULICITY) 3 TD/3.2KG SOPN Inject 3 mg into the skin once a week. 6 mL 3  . ferrous gluconate (IRON 27) 240 (27 FE) MG tablet Take 240 mg by mouth daily as needed (iron).     . gabapentin (NEURONTIN) 300 MG capsule Take 1 capsule (300 mg total) by mouth at bedtime. 90 capsule 4  . glipiZIDE (GLUCOTROL XL) 10 MG 24 hr tablet Take 1 tablet (10 mg total) by mouth daily with breakfast. 90 tablet 3  . hydrocortisone (ANUSOL-HC) 2.5 % rectal cream Place 1 application rectally 2 (two) times daily. 30 g 1  . ibuprofen (ADVIL,MOTRIN) 800 MG tablet Take 1 tablet (800 mg total) by mouth every 8 (eight) hours as needed. 60 tablet 0  . Insulin Glargine (BASAGLAR KWIKPEN) 100 UNIT/ML Inject 0.1 mLs (10  Units total) into the skin daily. 5 pen 12  . Insulin Pen Needle (PEN NEEDLES) 32G X 4 MM MISC Use with insulin pen 100 each 12  . Lancets (ONETOUCH DELICA PLUS URKYHC62B) MISC USE TO TEST BLOOD SUGAR UP TO 4 TIMES DAILY 100 each 5  . loratadine (CLARITIN) 10 MG tablet Take 10 mg by mouth daily.    Marland Kitchen losartan (COZAAR) 25 MG tablet Take 1 tablet (25  mg total) by mouth daily. 30 tablet 11  . Multiple Vitamin (MULTIVITAMIN WITH MINERALS) TABS tablet Take 1 tablet by mouth daily.    . nitroGLYCERIN (NITRODUR - DOSED IN MG/24 HR) 0.2 mg/hr patch Apply 1/4 patch daily to tendon for tendonitis. 30 patch 1  . omeprazole (PRILOSEC) 40 MG capsule Take 1 capsule (40 mg total) by mouth daily. 30 capsule 0  . ONETOUCH VERIO test strip USE 1 STRIP TO CHECK GLUCOSE 4 TIMES DAILY AS DIRECTED 100 each 5  . pioglitazone (ACTOS) 45 MG tablet Take 1 tablet (45 mg total) by mouth daily. 90 tablet 3  . rosuvastatin (CRESTOR) 10 MG tablet Take 1 tablet (10 mg total) by mouth daily. 90 tablet 3  . tamoxifen (NOLVADEX) 20 MG tablet Take 1 tablet (20 mg total) by mouth daily. 90 tablet 4  . valACYclovir (VALTREX) 500 MG tablet Take twice daily for 3-5 days 30 tablet 12  . venlafaxine XR (EFFEXOR-XR) 75 MG 24 hr capsule Take 1 capsule (75 mg total) by mouth daily with breakfast. 90 capsule 4  . zolpidem (AMBIEN) 10 MG tablet TAKE 1 TABLET BY MOUTH AT BEDTIME AS NEEDED FOR SLEEP 45 tablet 1  . carvedilol (COREG) 6.25 MG tablet Take 1 tablet (6.25 mg total) by mouth 2 (two) times daily. 60 tablet 11  . clindamycin (CLEOCIN) 300 MG capsule Take 300 mg by mouth 4 (four) times daily. (Patient not taking: Reported on 06/18/2020)     No current facility-administered medications for this visit.    OBJECTIVE:   Vitals:   06/18/20 1029  BP: 126/73  Pulse: 91  Resp: 18  Temp: 98.3 F (36.8 C)  SpO2: 100%     Body mass index is 49.27 kg/m.   Wt Readings from Last 3 Encounters:  06/18/20 269 lb 6.4 oz (122.2 kg)   04/16/20 276 lb 12.8 oz (125.6 kg)  03/12/20 280 lb 12.8 oz (127.4 kg)  ECOG FS: 2 - Symptomatic, <50% confined to bed GENERAL: Patient is a well appearing female in no acute distress HEENT:  Sclerae anicteric.  Mask in place. Neck is supple.  NODES:  No cervical, supraclavicular, or axillary lymphadenopathy palpated.  BREAST EXAM:  Right breast s/p lumpectomy and radiation, no sign of local recurrence, left breast benign LUNGS:  Clear to auscultation bilaterally.  No wheezes or rhonchi. HEART:  Regular rate and rhythm. No murmur appreciated. ABDOMEN:  Soft, nontender.  Positive, normoactive bowel sounds. No organomegaly palpated. MSK:  No focal spinal tenderness to palpation. Full range of motion bilaterally in the upper extremities. EXTREMITIES:  No peripheral edema.   SKIN:  Clear with no obvious rashes or skin changes. No nail dyscrasia. NEURO:  Nonfocal. Well oriented.  Appropriate affect.    LAB RESULTS:  CMP     Component Value Date/Time   NA 137 06/18/2020 1202   K 4.5 06/18/2020 1202   CL 101 06/18/2020 1202   CO2 20 (L) 06/18/2020 1202   GLUCOSE 211 (H) 06/18/2020 1202   BUN 16 06/18/2020 1202   CREATININE 1.04 (H) 06/18/2020 1202   CALCIUM 9.5 06/18/2020 1202   PROT 7.8 06/18/2020 1202   ALBUMIN 4.0 06/18/2020 1202   AST 29 06/18/2020 1202   ALT 26 06/18/2020 1202   ALKPHOS 120 06/18/2020 1202   BILITOT 0.5 06/18/2020 1202   GFRNONAA >60 06/18/2020 1202   GFRAA >60 04/16/2020 1119   GFRAA >60 10/11/2019 1054    No results found for: TOTALPROTELP, ALBUMINELP, A1GS, A2GS, BETS,  BETA2SER, GAMS, MSPIKE, SPEI  No results found for: Nils Pyle, [facility]  Lab Results  Component Value Date   WBC 4.4 06/18/2020   NEUTROABS 2.5 06/18/2020   HGB 14.1 06/18/2020   HCT 44.0 06/18/2020   MCV 86.4 06/18/2020   PLT 205 06/18/2020   No results found for: LABCA2  No components found for: LEXNTZ001  No results for input(s): INR in the last 168  hours.  No results found for: LABCA2  No results found for: VCB449  No results found for: QPR916  No results found for: BWG665  No results found for: CA2729  No components found for: HGQUANT  No results found for: CEA1 / No results found for: CEA1   No results found for: AFPTUMOR  No results found for: CHROMOGRNA  No results found for: HGBA, HGBA2QUANT, HGBFQUANT, HGBSQUAN (Hemoglobinopathy evaluation)   No results found for: LDH  Lab Results  Component Value Date   IRON 75 10/03/2019   IRONPCTSAT 20.9 10/03/2019   (Iron and TIBC)  No results found for: FERRITIN  Urinalysis    Component Value Date/Time   COLORURINE STRAW (A) 06/18/2020 1105   APPEARANCEUR CLEAR 06/18/2020 1105   LABSPEC 1.029 06/18/2020 1105   PHURINE 5.0 06/18/2020 1105   GLUCOSEU >=500 (A) 06/18/2020 1105   GLUCOSEU NEGATIVE 10/03/2019 1426   HGBUR NEGATIVE 06/18/2020 1105   BILIRUBINUR NEGATIVE 06/18/2020 1105   KETONESUR 80 (A) 06/18/2020 1105   PROTEINUR NEGATIVE 06/18/2020 1105   UROBILINOGEN 0.2 10/03/2019 1426   NITRITE NEGATIVE 06/18/2020 [place] 06/18/2020 1105    STUDIES: No results found.   ELIGIBLE FOR AVAILABLE RESEARCH PROTOCOL: no   ASSESSMENT: 49 y.o. Kelly Rowe, Kelly Rowe status post right breast upper outer quadrant biopsy 01/11/2018 for a clinical T1 N0, stage IA invasive ductal carcinoma, grade 3, estrogen and progesterone receptor positive, HER-2 amplified, with an MIB-1 of 30%.  (1) status post right lumpectomy and sentinel lymph node sampling 02/23/2018 for a pT2 pN1, stage IB invasive ductal carcinoma, grade 3, with close but negative margins  (2) adjuvant chemotherapy consisting of carboplatin, docetaxel, trastuzumab and Pertuzumab every 21 days x 6 given between 03/08/2018-07/05/2018 (a) pertuzumab stopped after cycle 1 due to diarrhea (b) docetaxel changed to gemcitabine after cycle 2 due to persistent hyperglycemia (c) Gemcitabine and  Carboplatin dose reduced by approximately 10% due to delayed neutropenia and thrombocytopenia  (3) anti-HER-2 immunotherapy started concurrently with chemotherapy (a) baseline echocardiogram on 02/07/2018 showed an ejection fraction in the 55-60% range (b) repeat echo 06/19/2018 showed an ejection fraction of 55-60% (c) repeat echocardiogram 11/14/2018 showed an EF in the 50-55% range, with normal strain. (d) echo 01/31/2019 shows an ejection fraction in the 55/60% range (e) final trastuzumab dose 03/12/2019  (4) adjuvant radiation 07/27/2018 - 09/06/2018  Site/dose: 1. Right Breast / 50 Gy in 25 fractions    2. Right Supraclavicular nodes / 50 Gy in 25 fractions    3. Right Breast Boost / 10 Gy in 5 fractions  (5) tamoxifen started November 2020  (6) genetics testing through [facility] Multi-cancer and Breast panel on 01/13/2018 showed: no deleterious mutations. The following genes were evaluated for sequence changes and exonic deletions/duplications: ALK, APC, ATM, AXIN2, BAP1, BARD1, BLM, BMPR1A, BRCA1, BRCA2, BRIP1, CASR, CDC73, CDH1, CDK4, CDKN1B, CDKN1C,CDKN2A (p14ARF), CDKN2A (p16INK4a), CEBPA, CHEK2, CTNNA1, DICER1, DIS3L2, EPCAM*, FH, FLCN, GATA2, GPC3, GREM1*, HRAS, KIT, MAX, MEN1, MET, MLH1, MSH2, MSH3, MSH6, MUTYH, NBN, NF1, NF2, PALB2, PDGFRA, PHOX2B*, PMS2, POLD1,POLE, POT1, PRKAR1A, PTCH1,  PTEN, RAD50, RAD51C, RAD51D, RB1, RECQL4, RET, RUNX1, SDHAF2, SDHB, SDHC, SDHD,SMAD4, SMARCA4, SMARCB1, SMARCE1, STK11, SUFU, TERC, TERT, TMEM127, TP53, TSC1, TSC2, VHL, WRN*, WT1.The following genes were evaluated for sequence changes only: EGFR*, HOXB13*, MITF*, NTHL1*, SDHA   PLAN: Dunya is doing well today.  She continues on Tamoxifen with good tolerance.  She has no sign of breast cancer recurrence.    I sent in Fluconazole for her to take.  I recommended she continue on the Clindamycin, and she will leave a urinalysis and culture today, along with have a CBC and CMET drawn.  She  understands this.   Daphanie's mammogram is due in 06/2020, she will see Dr. Tana Felts in 07/2020.  She knows to call for any questions that may arise between now and her next appointment.  We are happy to see her sooner if needed.   Total encounter time 20 minutes.Wilber Bihari, NP 06/20/20 9:34 AM Medical Oncology and Hematology [facility] [place], [place] [address] Tel. [phone]    Fax. [phone]   *Total Encounter Time as defined by the Centers for Medicare and Medicaid Services includes, in addition to the face-to-face time of a patient visit (documented in the note above) non-face-to-face time: obtaining and reviewing outside history, ordering and reviewing medications, tests or procedures, care coordination (communications with other health care professionals or caregivers) and documentation in the medical record.

## 2020-06-18 NOTE — Telephone Encounter (Signed)
-----   Message from Gardenia Phlegm, NP sent at 06/18/2020  1:12 PM EST ----- Please let Jona know that her kidney function is slightly elevated and she needs to increase her fluid intake.  Please forward labs to PCP.   ----- Message ----- From: Buel Ream, Lab In [place] Sent: 06/18/2020  12:21 PM EST To: Gardenia Phlegm, NP

## 2020-06-18 NOTE — Telephone Encounter (Signed)
Called and left below message. Per Wilber Bihari, NP her urinalysis looks good. Ask her to call the office back for questions. Faxed labs to Dr. Cathlean Cower at [phone]. Received confirmation.

## 2020-06-19 LAB — URINE CULTURE: Culture: NO GROWTH

## 2020-06-20 ENCOUNTER — Telehealth: Payer: Self-pay | Admitting: Adult Health

## 2020-06-20 NOTE — Telephone Encounter (Signed)
No 11/24 los. No changes made to pt's schedule.

## 2020-06-23 ENCOUNTER — Telehealth: Payer: Self-pay | Admitting: Oncology

## 2020-06-23 NOTE — Telephone Encounter (Signed)
Scheduled apt per 11/26 sch msg - mailed letter with appt date and time

## 2020-06-25 ENCOUNTER — Other Ambulatory Visit: Payer: Self-pay

## 2020-06-25 ENCOUNTER — Ambulatory Visit: Payer: BC Managed Care – PPO | Attending: General Surgery

## 2020-06-25 DIAGNOSIS — M25511 Pain in right shoulder: Secondary | ICD-10-CM | POA: Insufficient documentation

## 2020-06-25 DIAGNOSIS — R293 Abnormal posture: Secondary | ICD-10-CM | POA: Diagnosis present

## 2020-06-25 DIAGNOSIS — G8929 Other chronic pain: Secondary | ICD-10-CM | POA: Diagnosis present

## 2020-06-25 DIAGNOSIS — M25611 Stiffness of right shoulder, not elsewhere classified: Secondary | ICD-10-CM | POA: Insufficient documentation

## 2020-06-25 DIAGNOSIS — I890 Lymphedema, not elsewhere classified: Secondary | ICD-10-CM | POA: Diagnosis present

## 2020-06-25 DIAGNOSIS — Z483 Aftercare following surgery for neoplasm: Secondary | ICD-10-CM

## 2020-06-25 DIAGNOSIS — C50411 Malignant neoplasm of upper-outer quadrant of right female breast: Secondary | ICD-10-CM | POA: Insufficient documentation

## 2020-06-25 DIAGNOSIS — Z170 Estrogen receptor positive status [ER+]: Secondary | ICD-10-CM | POA: Diagnosis present

## 2020-06-25 NOTE — Therapy (Signed)
[place], [name], [address] Phone: [phone]   Fax:  [phone]  Physical Therapy Treatment  Patient Details  Name: Kelly Rowe MRN: [id number] Date of Birth: 1970-09-18 Referring Provider (PT): Dr. Barry Dienes    Encounter Date: 06/25/2020   PT End of Session - 06/25/20 1355    Visit Number 8    Number of Visits 15    Date for PT Re-Evaluation 07/09/20    PT Start Time 1309    PT Stop Time 1355    PT Time Calculation (min) 46 min    Activity Tolerance Patient tolerated treatment well    Behavior During Therapy [facility] for tasks assessed/performed           Past Medical History:  Diagnosis Date  . ANEMIA-IRON DEFICIENCY 01/30/2010  . ANXIETY 11/27/2007  . Arthritis   . Asthma 02/25/2011  . Cancer ([place])    breast  . Carbuncle and furuncle of trunk 04/16/2010  . Chlamydia infection 03/21/2008  . DIABETES MELLITUS, TYPE II 08/02/2007  . Edema 01/30/2010  . ELEVATED BLOOD PRESSURE WITHOUT DIAGNOSIS OF HYPERTENSION 08/02/2007  . Family history of breast cancer   . Family history of colon cancer   . Family history of lung cancer   . FREQUENCY, URINARY 05/19/2009  . GENITAL HERPES 03/12/2009  . GERD (gastroesophageal reflux disease)   . History of kidney stones   . History of radiation therapy 07/27/18- 09/06/18   Right Breast, Right Axillary and Supraclavicular nodes 50 Gy in 25 fractions, Right Breast Boost 10 Gy in 5 fractions  . HIV (human immunodeficiency virus infection) ([place]) 2009  . HIV INFECTION 03/12/2009  . HSV (herpes simplex virus) infection   . HYPERLIPIDEMIA 08/02/2007  . Hypertension   . Metrorrhagia 03/21/2008  . PONV (postoperative nausea and vomiting)    woke up crying per patient   . RETENTION, URINE 05/19/2009  . Sleep apnea   . Trichomonas infection 01/19/2010  . VITAMIN D DEFICIENCY 01/30/2010    Past Surgical History:  Procedure Laterality Date  . ABDOMINAL HYSTERECTOMY   07/22/2010   TAH WITH PRESERVATION OF BOTH TUBES AND OVARIES  . BREAST LUMPECTOMY Right 2019  . BREAST LUMPECTOMY WITH RADIOACTIVE SEED AND SENTINEL LYMPH NODE BIOPSY Right 02/23/2018   Procedure: BREAST LUMPECTOMY WITH RADIOACTIVE SEED AND SENTINEL LYMPH NODE BIOPSY;  Surgeon: Stark Klein, MD;  Location: [place];  Service: General;  Laterality: Right;  . CHOLECYSTECTOMY    . ENDOMETRIAL ABLATION  01/11/2008   HER OPTION  . ESOPHAGOGASTRODUODENOSCOPY    . MULTIPLE TOOTH EXTRACTIONS    . PORT-A-CATH REMOVAL Left 05/08/2019   Procedure: REMOVAL PORT-A-CATH;  Surgeon: Stark Klein, MD;  Location: [place];  Service: General;  Laterality: Left;  . PORTACATH PLACEMENT Left 02/23/2018   Procedure: INSERTION PORT-A-CATH;  Surgeon: Stark Klein, MD;  Location: [place];  Service: General;  Laterality: Left;  . PORTACATH PLACEMENT N/A 04/21/2018   Procedure: PORT-A-CATH REVISION;  Surgeon: Stark Klein, MD;  [facility];  Service: General;  Laterality: N/A;  . TUBAL LIGATION    . WISDOM TOOTH EXTRACTION      There were no vitals filed for this visit.   Subjective Assessment - 06/25/20 1309    Subjective I feel good.  Much less tightness in my arm.  Can reach further without pain. It sore under my arm where the incision is. I was able to open a jar of pickles with my right hand.  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 Chemotherapy completed 07/05/18, Herceptin started with chemo and continues through August of this year.  Radiation 07/27/18-09/06/18 to the Rt breast, Antiestrogens to follow. Other medical history to include asthma, DM, HTN, HIV, and hysterectomy    Patient Stated Goals to get swelling down and be able to reach- wants to be able to wash herself in the shower    Currently in Pain? Yes    Pain Score 1     Pain Location Shoulder    Pain Orientation Right    Pain Descriptors /  Indicators Aching    Pain Type Chronic pain                       [facility]patient [facility]  Lymphedema Life Impact Scale Total Score 89.71 %            [facility] Adult PT Treatment/Exercise - 06/25/20 0001      Shoulder Exercises: Pulleys   Flexion 2 minutes    ABduction 2 minutes      Shoulder Exercises: Therapy Ball   Flexion 10 reps    ABduction Right;5 reps   with stretch at end range     Iontophoresis   Type of Iontophoresis Dexamethasone    Location Right prox biceps    Dose 4 mg/ml    Time 4 hr      Manual Therapy   Manual Lymphatic Drainage (MLD) short neck, 5 diaphragmatic breaths, right inguinal nodes and establishment of axillo inguinal pathway, R breast moving fluid towards pathways then retracing all steps    Passive ROM to Right shoulder flex, abd, IR and ER                  PT Education - 06/25/20 1354    Education Details Pt educated to remove Ionto patch in 4 hrs or sooner if she experiences any signs of rash or discomfort.  She verbalized understanding    Person(s) Educated Patient    Methods Explanation    Comprehension Verbalized understanding               PT Long Term Goals - 06/11/20 1426      PT LONG TERM GOAL #1   Title Pt will demonstrate 140 degrees of R shoulder abduction to allow her to reach out to the side    Baseline 91; 06/11/20- 147    Time 4    Period Weeks    Status Achieved      PT LONG TERM GOAL #2   Title Pt will demonstrate 150 degrees of right shoulder flexion to allow her to reach items on the shelf    Baseline 127; 06/11/20- 127    Time 4    Period Weeks    Status On-going      PT LONG TERM GOAL #3   Title Pt will demonstrate 40 degrees of R shoulder IR to allow her to reach back to fasten her bra and perform hygeine    Baseline 19; 06/11/20- 34    Time 4    Period Weeks    Status On-going      PT LONG TERM GOAL #4   Title Pt  will be able to open a jar or a can without assistance from her son to decrease dependence on caregivers.    Time 4  Period Weeks    Status On-going      PT LONG TERM GOAL #5   Title Pt will receive a compression bra, sleeve and glove for long term management of lymphedema.    Baseline 06/11/20- pt signed up to be measured 12/7    Time 4    Period Weeks    Status On-going      PT LONG TERM GOAL #6   Title Pt will be independent in a home exercise program for strengthening and stretching    Time 4    Period Weeks    Status On-going      PT LONG TERM GOAL #7   Title Pt will demosntrate a 50% improvement in fibrosis present throughout R breast to decrease risk of cellulitis    Time 4    Period Weeks    Status On-going      PT LONG TERM GOAL #8   Title Pt will improve right grip strength (#2 setting) to atleast 34 lbs for improved ability to perform household chores    Baseline 16 lbs; 06/11/20- 30lbs    Time 5    Period Weeks    Status On-going                 Plan - 06/25/20 1355    Clinical Impression Statement Pt continues with complaints of right shoulder pain, and notes that ROM seems to be improving as does swelling.  Treatment consisted of exs, PROM of right shoulder with greatest limitation in ER today, and MLD to right breast.  Ionto #1 was perofrmed to right proximal biceps area of tenderness.  Pt. was instructed in wear time and removal    Personal Factors and Comorbidities Comorbidity 3+;Time since onset of injury/illness/exacerbation;Fitness    Comorbidities radiation history, lymph node removal, diabetes    Examination-Activity Limitations Sleep;Dressing;Carry;Reach Overhead;Toileting;Lift;Bathing    Examination-Participation Restrictions Occupation;Meal Prep;Community Activity;Cleaning;Shop;Laundry    Stability/Clinical Decision Making Evolving/Moderate complexity    Rehab Potential Good    PT Frequency 2x / week    PT Duration 4 weeks    PT  Treatment/Interventions ADLs/Self Care Home Management;Manual lymph drainage;Compression bandaging;Manual techniques;Taping;DME Instruction;Therapeutic exercise;Patient/family education;Passive range of motion;Scar mobilization;Therapeutic activities;Joint Manipulations;Iontophoresis 4mg /ml Dexamethasone    PT Next Visit Plan check for  script for bra and put with ALight form at back of schedule for [employer], cont ionto ,  cont pulleys and ball, Add grip strength exs, continue shoulder ROM, MLD, MFR techniques prn.  Reinforce patient compliance    Consulted and Agree with Plan of Care Patient           Patient will benefit from skilled therapeutic intervention in order to improve the following deficits and impairments:  Increased edema, Impaired UE functional use, Postural dysfunction, Pain, Decreased knowledge of use of DME, Decreased knowledge of precautions, Decreased scar mobility, Decreased range of motion, Decreased strength, Increased fascial restricitons  Visit Diagnosis: Lymphedema, not elsewhere classified  Stiffness of right shoulder, not elsewhere classified  Chronic right shoulder pain  Abnormal posture  Aftercare following surgery for neoplasm  Malignant neoplasm of upper-outer quadrant of right breast in female, estrogen receptor positive ([place])     Problem List Patient Active Problem List   Diagnosis Date Noted  . Osteoarthritis of left knee 11/29/2019  . Lymphedema of right arm 10/11/2019  . Upper respiratory infection 07/02/2019  . Type 2 diabetes mellitus with other specified complication ([place]) 00/76/2263  . Hematochezia 03/30/2018  . Insomnia 03/30/2018  . Port-A-Cath in  place 03/29/2018  . Genetic testing 01/30/2018  . Neoplasm of breast, regional lymph node staging category N3b: metastasis in ipsilateral internal mammary lymph node and axillary lymph node ([place]) 01/30/2018  . Family history of breast cancer   . Family history of colon cancer   . Family  history of lung cancer   . Morbid obesity ([place]) 01/18/2018  . Malignant neoplasm of upper-outer quadrant of right breast in female, estrogen receptor positive ([place]) 01/17/2018  . Achilles tendinitis 11/28/2017  . Hot flashes 09/30/2017  . Hypertension 09/08/2017  . Contusion of left knee and lower leg 11/18/2015  . Mild intermittent asthma 08/15/2013  . BV (bacterial vaginosis) 08/11/2012  . Right knee pain 07/05/2012  . GERD (gastroesophageal reflux disease) 07/05/2012  . GC (gonococcus) 05/30/2012  . Chlamydia 05/30/2012  . Left knee pain 01/12/2012  . Encounter for long-term (current) use of high-risk medication 02/25/2011  . Preventative health care 02/14/2011  . VITAMIN D DEFICIENCY 01/30/2010  . ANEMIA-IRON DEFICIENCY 01/30/2010  . Edema 01/30/2010  . HIV (human immunodeficiency virus infection) ([place]) 03/12/2009  . GENITAL HERPES 03/12/2009  . Anxiety state 11/27/2007  . 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 Hyperlipidemia 08/02/2007    Claris Pong 06/25/2020, 2:00 PM  [place], [name], [address] Phone: [phone]   Fax:  [phone]  Name: Kelly Rowe MRN: [id number] Date of Birth: 09-Jan-1971 Cheral Almas, PT 06/25/20 2:00 PM

## 2020-06-27 ENCOUNTER — Ambulatory Visit
Admission: RE | Admit: 2020-06-27 | Discharge: 2020-06-27 | Disposition: A | Discharge status: Home / Self Care | Payer: Self-pay | Source: Ambulatory Visit | Attending: Oncology | Admitting: Oncology

## 2020-06-27 ENCOUNTER — Other Ambulatory Visit: Payer: Self-pay

## 2020-06-27 DIAGNOSIS — Z9889 Other specified postprocedural states: Secondary | ICD-10-CM

## 2020-07-01 ENCOUNTER — Other Ambulatory Visit: Payer: Self-pay

## 2020-07-01 ENCOUNTER — Ambulatory Visit: Payer: BC Managed Care – PPO

## 2020-07-01 DIAGNOSIS — C50411 Malignant neoplasm of upper-outer quadrant of right female breast: Secondary | ICD-10-CM

## 2020-07-01 DIAGNOSIS — I890 Lymphedema, not elsewhere classified: Secondary | ICD-10-CM | POA: Diagnosis not present

## 2020-07-01 DIAGNOSIS — R293 Abnormal posture: Secondary | ICD-10-CM

## 2020-07-01 DIAGNOSIS — M25611 Stiffness of right shoulder, not elsewhere classified: Secondary | ICD-10-CM

## 2020-07-01 DIAGNOSIS — M25511 Pain in right shoulder: Secondary | ICD-10-CM

## 2020-07-01 DIAGNOSIS — G8929 Other chronic pain: Secondary | ICD-10-CM

## 2020-07-01 DIAGNOSIS — Z170 Estrogen receptor positive status [ER+]: Secondary | ICD-10-CM

## 2020-07-01 DIAGNOSIS — Z483 Aftercare following surgery for neoplasm: Secondary | ICD-10-CM

## 2020-07-01 NOTE — Therapy (Signed)
[place], [name], [address] Phone: [phone]   Fax:  [phone]  Physical Therapy Treatment  Patient Details  Name: Kelly Rowe MRN: [id number] Date of Birth: 06/27/1971 Referring Provider (PT): Dr. Barry Dienes    Encounter Date: 07/01/2020   PT End of Session - 07/01/20 1556    Visit Number 9    Number of Visits 15    Date for PT Re-Evaluation 07/09/20    PT Start Time 1509    PT Stop Time 1551    PT Time Calculation (min) 42 min    Activity Tolerance Patient tolerated treatment well    Behavior During Therapy [facility] for tasks assessed/performed           Past Medical History:  Diagnosis Date  . ANEMIA-IRON DEFICIENCY 01/30/2010  . ANXIETY 11/27/2007  . Arthritis   . Asthma 02/25/2011  . Cancer ([place])    breast  . Carbuncle and furuncle of trunk 04/16/2010  . Chlamydia infection 03/21/2008  . DIABETES MELLITUS, TYPE II 08/02/2007  . Edema 01/30/2010  . ELEVATED BLOOD PRESSURE WITHOUT DIAGNOSIS OF HYPERTENSION 08/02/2007  . Family history of breast cancer   . Family history of colon cancer   . Family history of lung cancer   . FREQUENCY, URINARY 05/19/2009  . GENITAL HERPES 03/12/2009  . GERD (gastroesophageal reflux disease)   . History of kidney stones   . History of radiation therapy 07/27/18- 09/06/18   Right Breast, Right Axillary and Supraclavicular nodes 50 Gy in 25 fractions, Right Breast Boost 10 Gy in 5 fractions  . HIV (human immunodeficiency virus infection) ([place]) 2009  . HIV INFECTION 03/12/2009  . HSV (herpes simplex virus) infection   . HYPERLIPIDEMIA 08/02/2007  . Hypertension   . Metrorrhagia 03/21/2008  . PONV (postoperative nausea and vomiting)    woke up crying per patient   . RETENTION, URINE 05/19/2009  . Sleep apnea   . Trichomonas infection 01/19/2010  . VITAMIN D DEFICIENCY 01/30/2010    Past Surgical History:  Procedure Laterality Date  . ABDOMINAL HYSTERECTOMY   07/22/2010   TAH WITH PRESERVATION OF BOTH TUBES AND OVARIES  . BREAST LUMPECTOMY Right 2019  . BREAST LUMPECTOMY WITH RADIOACTIVE SEED AND SENTINEL LYMPH NODE BIOPSY Right 02/23/2018   Procedure: BREAST LUMPECTOMY WITH RADIOACTIVE SEED AND SENTINEL LYMPH NODE BIOPSY;  Surgeon: Stark Klein, MD;  Location: [place];  Service: General;  Laterality: Right;  . CHOLECYSTECTOMY    . ENDOMETRIAL ABLATION  01/11/2008   HER OPTION  . ESOPHAGOGASTRODUODENOSCOPY    . MULTIPLE TOOTH EXTRACTIONS    . PORT-A-CATH REMOVAL Left 05/08/2019   Procedure: REMOVAL PORT-A-CATH;  Surgeon: Stark Klein, MD;  Location: [place];  Service: General;  Laterality: Left;  . PORTACATH PLACEMENT Left 02/23/2018   Procedure: INSERTION PORT-A-CATH;  Surgeon: Stark Klein, MD;  Location: [place];  Service: General;  Laterality: Left;  . PORTACATH PLACEMENT N/A 04/21/2018   Procedure: PORT-A-CATH REVISION;  Surgeon: Stark Klein, MD;  [facility];  Service: General;  Laterality: N/A;  . TUBAL LIGATION    . WISDOM TOOTH EXTRACTION      There were no vitals filed for this visit.   Subjective Assessment - 07/01/20 1509    Subjective Got measured this morning for compression bra.  Arm feels wonderful.  I think that patch really helped.  No present pain. Still have some swelling in breast.    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 Chemotherapy completed 07/05/18, Herceptin started with chemo and continues through August of this year.  Radiation 07/27/18-09/06/18 to the Rt breast, Antiestrogens to follow. Other medical history to include asthma, DM, HTN, HIV, and hysterectomy    Currently in Pain? No/denies    Pain Score 0-No pain    Pain Relieving Factors Ionto helped                       [facility]patient [facility]  Lymphedema Life Impact Scale Total  Score 89.71 %            [facility] Adult PT Treatment/Exercise - 07/01/20 0001      Shoulder Exercises: Pulleys   Flexion 2 minutes    ABduction 2 minutes      Shoulder Exercises: Therapy Ball   Flexion 10 reps    ABduction Right;5 reps;25 reps   with stretch at end range     Iontophoresis   Type of Iontophoresis --   forgotten but pt not having pain     Manual Therapy   Edema Management chip pack made for right breast area of fibrosis    Manual Lymphatic Drainage (MLD) short neck, 5 diaphragmatic breaths, right inguinal nodes and establishment of axillo inguinal pathway, R breast moving fluid towards pathways then retracing all steps    Passive ROM to Right shoulder flex, abd, IR and ER                  PT Education - 07/01/20 1555    Education Details Pt advised to remove chip pack if having any discomfort and to look at area to see if she notices softening/impressions from chip pack    Person(s) Educated Patient    Methods Explanation    Comprehension Verbalized understanding               PT Long Term Goals - 06/11/20 1426      PT LONG TERM GOAL #1   Title Pt will demonstrate 140 degrees of R shoulder abduction to allow her to reach out to the side    Baseline 91; 06/11/20- 147    Time 4    Period Weeks    Status Achieved      PT LONG TERM GOAL #2   Title Pt will demonstrate 150 degrees of right shoulder flexion to allow her to reach items on the shelf    Baseline 127; 06/11/20- 127    Time 4    Period Weeks    Status On-going      PT LONG TERM GOAL #3   Title Pt will demonstrate 40 degrees of R shoulder IR to allow her to reach back to fasten her bra and perform hygeine    Baseline 19; 06/11/20- 34    Time 4    Period Weeks    Status On-going      PT LONG TERM GOAL #4   Title Pt will be able to open a jar or a can without assistance from her son to decrease dependence on caregivers.    Time 4    Period Weeks    Status On-going      PT  LONG TERM GOAL #5   Title Pt will receive a compression bra, sleeve and glove for long term management of lymphedema.    Baseline 06/11/20- pt signed up to be  measured 12/7    Time 4    Period Weeks    Status On-going      PT LONG TERM GOAL #6   Title Pt will be independent in a home exercise program for strengthening and stretching    Time 4    Period Weeks    Status On-going      PT LONG TERM GOAL #7   Title Pt will demosntrate a 50% improvement in fibrosis present throughout R breast to decrease risk of cellulitis    Time 4    Period Weeks    Status On-going      PT LONG TERM GOAL #8   Title Pt will improve right grip strength (#2 setting) to atleast 34 lbs for improved ability to perform household chores    Baseline 16 lbs; 06/11/20- 30lbs    Time 5    Period Weeks    Status On-going                 Plan - 07/01/20 1557    Clinical Impression Statement Pt notes great improvement in shoulder pain and notes minimal limitations now with home activities.  Right breast swelling/fibosis is also improved.  Chip pack was made for breast and pt placed in her bra before leaving.  Ionto was not performed today secondary to forgotten, but pt was also not having any pain. She was measured for compression bra today    Personal Factors and Comorbidities Comorbidity 3+;Time since onset of injury/illness/exacerbation;Fitness    Comorbidities radiation history, lymph node removal, diabetes    Examination-Activity Limitations Sleep;Dressing;Carry;Reach Overhead;Toileting;Lift;Bathing    Stability/Clinical Decision Making Evolving/Moderate complexity    Clinical Decision Making Moderate    Rehab Potential Good    PT Frequency 2x / week    PT Duration 4 weeks    PT Treatment/Interventions ADLs/Self Care Home Management;Manual lymph drainage;Compression bandaging;Manual techniques;Taping;DME Instruction;Therapeutic exercise;Patient/family education;Passive range of motion;Scar  mobilization;Therapeutic activities;Joint Manipulations;Iontophoresis 4mg /ml Dexamethasone    PT Next Visit Plan cont Ionto prn (forgotten today), continue exs, MLD prn, PROM.  Pt measured for bra today    Consulted and Agree with Plan of Care Patient           Patient will benefit from skilled therapeutic intervention in order to improve the following deficits and impairments:  Increased edema, Impaired UE functional use, Postural dysfunction, Pain, Decreased knowledge of use of DME, Decreased knowledge of precautions, Decreased scar mobility, Decreased range of motion, Decreased strength, Increased fascial restricitons  Visit Diagnosis: Lymphedema, not elsewhere classified  Stiffness of right shoulder, not elsewhere classified  Chronic right shoulder pain  Aftercare following surgery for neoplasm  Abnormal posture  Malignant neoplasm of upper-outer quadrant of right breast in female, estrogen receptor positive ([place])     Problem List Patient Active Problem List   Diagnosis Date Noted  . Osteoarthritis of left knee 11/29/2019  . Lymphedema of right arm 10/11/2019  . Upper respiratory infection 07/02/2019  . Type 2 diabetes mellitus with other specified complication ([place]) 80/16/5537  . Hematochezia 03/30/2018  . Insomnia 03/30/2018  . Port-A-Cath in place 03/29/2018  . Genetic testing 01/30/2018  . Neoplasm of breast, regional lymph node staging category N3b: metastasis in ipsilateral internal mammary lymph node and axillary lymph node ([place]) 01/30/2018  . Family history of breast cancer   . Family history of colon cancer   . Family history of lung cancer   . Morbid obesity ([place]) 01/18/2018  . Malignant neoplasm of upper-outer quadrant of  right breast in female, estrogen receptor positive ([place]) 01/17/2018  . Achilles tendinitis 11/28/2017  . Hot flashes 09/30/2017  . Hypertension 09/08/2017  . Contusion of left knee and lower leg 11/18/2015  . Mild intermittent asthma  08/15/2013  . BV (bacterial vaginosis) 08/11/2012  . Right knee pain 07/05/2012  . GERD (gastroesophageal reflux disease) 07/05/2012  . GC (gonococcus) 05/30/2012  . Chlamydia 05/30/2012  . Left knee pain 01/12/2012  . Encounter for long-term (current) use of high-risk medication 02/25/2011  . Preventative health care 02/14/2011  . VITAMIN D DEFICIENCY 01/30/2010  . ANEMIA-IRON DEFICIENCY 01/30/2010  . Edema 01/30/2010  . HIV (human immunodeficiency virus infection) ([place]) 03/12/2009  . GENITAL HERPES 03/12/2009  . Anxiety state 11/27/2007  . 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 Hyperlipidemia 08/02/2007    Claris Pong 07/01/2020, 4:01 PM  [place], [name], [address] Phone: [phone]   Fax:  [phone]  Name: Amaiyah Nordhoff MRN: [id number] Date of Birth: 1971-05-09  Cheral Almas, PT 07/01/20 4:02 PM

## 2020-07-03 ENCOUNTER — Encounter: Payer: Self-pay | Admitting: Rehabilitation

## 2020-07-03 ENCOUNTER — Other Ambulatory Visit: Payer: Self-pay

## 2020-07-03 ENCOUNTER — Ambulatory Visit: Payer: BC Managed Care – PPO | Admitting: Rehabilitation

## 2020-07-03 DIAGNOSIS — Z170 Estrogen receptor positive status [ER+]: Secondary | ICD-10-CM

## 2020-07-03 DIAGNOSIS — I890 Lymphedema, not elsewhere classified: Secondary | ICD-10-CM

## 2020-07-03 DIAGNOSIS — G8929 Other chronic pain: Secondary | ICD-10-CM

## 2020-07-03 DIAGNOSIS — Z483 Aftercare following surgery for neoplasm: Secondary | ICD-10-CM

## 2020-07-03 DIAGNOSIS — M25611 Stiffness of right shoulder, not elsewhere classified: Secondary | ICD-10-CM

## 2020-07-03 DIAGNOSIS — M25511 Pain in right shoulder: Secondary | ICD-10-CM

## 2020-07-03 DIAGNOSIS — C50411 Malignant neoplasm of upper-outer quadrant of right female breast: Secondary | ICD-10-CM

## 2020-07-03 DIAGNOSIS — R293 Abnormal posture: Secondary | ICD-10-CM

## 2020-07-03 NOTE — Therapy (Signed)
[place], [name], [address] Phone: [phone]   Fax:  [phone]  Physical Therapy Treatment  Patient Details  Name: Kelly Rowe MRN: [id number] Date of Birth: September 05, 1970 Referring Provider (PT): Dr. Barry Dienes    Encounter Date: 07/03/2020   PT End of Session - 07/03/20 1441    Visit Number 10    Number of Visits 15    Date for PT Re-Evaluation 07/09/20    PT Start Time 1400    PT Stop Time 1430   pt having to leave for another appt   PT Time Calculation (min) 30 min    Activity Tolerance Patient tolerated treatment well    Behavior During Therapy [facility] for tasks assessed/performed           Past Medical History:  Diagnosis Date  . ANEMIA-IRON DEFICIENCY 01/30/2010  . ANXIETY 11/27/2007  . Arthritis   . Asthma 02/25/2011  . Cancer ([place])    breast  . Carbuncle and furuncle of trunk 04/16/2010  . Chlamydia infection 03/21/2008  . DIABETES MELLITUS, TYPE II 08/02/2007  . Edema 01/30/2010  . ELEVATED BLOOD PRESSURE WITHOUT DIAGNOSIS OF HYPERTENSION 08/02/2007  . Family history of breast cancer   . Family history of colon cancer   . Family history of lung cancer   . FREQUENCY, URINARY 05/19/2009  . GENITAL HERPES 03/12/2009  . GERD (gastroesophageal reflux disease)   . History of kidney stones   . History of radiation therapy 07/27/18- 09/06/18   Right Breast, Right Axillary and Supraclavicular nodes 50 Gy in 25 fractions, Right Breast Boost 10 Gy in 5 fractions  . HIV (human immunodeficiency virus infection) ([place]) 2009  . HIV INFECTION 03/12/2009  . HSV (herpes simplex virus) infection   . HYPERLIPIDEMIA 08/02/2007  . Hypertension   . Metrorrhagia 03/21/2008  . PONV (postoperative nausea and vomiting)    woke up crying per patient   . RETENTION, URINE 05/19/2009  . Sleep apnea   . Trichomonas infection 01/19/2010  . VITAMIN D DEFICIENCY 01/30/2010    Past Surgical History:  Procedure  Laterality Date  . ABDOMINAL HYSTERECTOMY  07/22/2010   TAH WITH PRESERVATION OF BOTH TUBES AND OVARIES  . BREAST LUMPECTOMY Right 2019  . BREAST LUMPECTOMY WITH RADIOACTIVE SEED AND SENTINEL LYMPH NODE BIOPSY Right 02/23/2018   Procedure: BREAST LUMPECTOMY WITH RADIOACTIVE SEED AND SENTINEL LYMPH NODE BIOPSY;  Surgeon: Stark Klein, MD;  Location: [place];  Service: General;  Laterality: Right;  . CHOLECYSTECTOMY    . ENDOMETRIAL ABLATION  01/11/2008   HER OPTION  . ESOPHAGOGASTRODUODENOSCOPY    . MULTIPLE TOOTH EXTRACTIONS    . PORT-A-CATH REMOVAL Left 05/08/2019   Procedure: REMOVAL PORT-A-CATH;  Surgeon: Stark Klein, MD;  Location: [facility];  Service: General;  Laterality: Left;  . PORTACATH PLACEMENT Left 02/23/2018   Procedure: INSERTION PORT-A-CATH;  Surgeon: Stark Klein, MD;  Location: [place];  Service: General;  Laterality: Left;  . PORTACATH PLACEMENT N/A 04/21/2018   Procedure: PORT-A-CATH REVISION;  Surgeon: Stark Klein, MD;  [facility];  Service: General;  Laterality: N/A;  . TUBAL LIGATION    . WISDOM TOOTH EXTRACTION      There were no vitals filed for this visit.   Subjective Assessment - 07/03/20 1400    Subjective I am feeling better.  I haven't had any pain at all.    Pertinent History s/p Rt lumpectomy and SLNB 02/23/18 due to stage IB triple positive IDC  2 nodes taken pt thinking both negative. Chemotherapy completed 07/05/18, Herceptin started with chemo and continues through August of this year.  Radiation 07/27/18-09/06/18 to the Rt breast, Antiestrogens to follow. Other medical history to include asthma, DM, HTN, HIV, and hysterectomy    Patient Stated Goals to get swelling down and be able to reach- wants to be able to wash herself in the shower    Currently in Pain? No/denies              [facility] PT Assessment - 07/03/20 0001      AROM   Right Shoulder Flexion 150 Degrees   no pain and just feels like it can't go any further   Right  Shoulder ABduction 162 Degrees   a little pinch in the shoulder     Strength   Right Hand Grip (lbs) 42    Left Hand Grip (lbs) 44.5                 Flowsheet Row Outpatient Rehab from 05/14/2020 in [place]  Lymphedema Life Impact Scale Total Score 89.71 %            [facility] Adult PT Treatment/Exercise - 07/03/20 0001      Manual Therapy   Manual Lymphatic Drainage (MLD) short neck, 5 diaphragmatic breaths, superficial and deep abdominals, right inguinal nodes and establishment of axillo inguinal pathway, sternal nodes, and then R breast moving fluid towards pathways then retracing all steps. Focus on medial inferior breast with hardness/fibrosis evident under enlarged pores    Passive ROM to Right shoulder flex and abduction                       PT Long Term Goals - 07/03/20 1445      PT LONG TERM GOAL #1   Title Pt will demonstrate 140 degrees of R shoulder abduction to allow her to reach out to the side    Baseline 91; 06/11/20- 147    Status Achieved      PT LONG TERM GOAL #2   Title Pt will demonstrate 150 degrees of right shoulder flexion to allow her to reach items on the shelf    Baseline 127; 06/11/20- 127, 07/03/20: 162    Status Achieved      PT LONG TERM GOAL #3   Title Pt will demonstrate 40 degrees of R shoulder IR to allow her to reach back to fasten her bra and perform hygeine    Baseline 19; 06/11/20- 34    Status On-going      PT LONG TERM GOAL #4   Title Pt will be able to open a jar or a can without assistance from her son to decrease dependence on caregivers.    Status On-going      PT LONG TERM GOAL #5   Title Pt will receive a compression bra, sleeve and glove for long term management of lymphedema.    Status Partially Met      PT LONG TERM GOAL #6   Title Pt will be independent in a home exercise program for strengthening and stretching    Status On-going      PT LONG TERM GOAL #7    Title Pt will demosntrate a 50% improvement in fibrosis present throughout R breast to decrease risk of cellulitis    Status On-going      PT LONG TERM GOAL #8   Title Pt will improve right grip strength (#2  setting) to atleast 34 lbs for improved ability to perform household chores    Baseline 16 lbs; 06/11/20- 30lbs, 07/03/20: 42    Status Achieved                 Plan - 07/03/20 1442    Clinical Impression Statement Pt continues with minimal shoulder and breast pain lately continuing to have intermittent breast pain and end range shoulder pain.  Pt continues with fibrosis of the right breast due to lymphedema especially inferior medial breast which softens with MLD.  Short session today due to having to leave for another appt, but goals were assessed along with AROM and grip strength all which have greatly improved.    PT Frequency 2x / week    PT Duration 4 weeks    PT Treatment/Interventions ADLs/Self Care Home Management;Manual lymph drainage;Compression bandaging;Manual techniques;Taping;DME Instruction;Therapeutic exercise;Patient/family education;Passive range of motion;Scar mobilization;Therapeutic activities;Joint Manipulations;Iontophoresis 63m/ml Dexamethasone    PT Next Visit Plan cont Ionto prn, continue MLD of the right breast, PROM.    Consulted and Agree with Plan of Care Patient           Patient will benefit from skilled therapeutic intervention in order to improve the following deficits and impairments:     Visit Diagnosis: Lymphedema, not elsewhere classified  Stiffness of right shoulder, not elsewhere classified  Chronic right shoulder pain  Aftercare following surgery for neoplasm  Abnormal posture  Malignant neoplasm of upper-outer quadrant of right breast in female, estrogen receptor positive ([place]     Problem List Patient Active Problem List   Diagnosis Date Noted  . Osteoarthritis of left knee 11/29/2019  . Lymphedema of right arm  10/11/2019  . Upper respiratory infection 07/02/2019  . Type 2 diabetes mellitus with other specified complication ([place] 009/32/6712 . Hematochezia 03/30/2018  . Insomnia 03/30/2018  . Port-A-Cath in place 03/29/2018  . Genetic testing 01/30/2018  . Neoplasm of breast, regional lymph node staging category N3b: metastasis in ipsilateral internal mammary lymph node and axillary lymph node ([place] 01/30/2018  . Family history of breast cancer   . Family history of colon cancer   . Family history of lung cancer   . Morbid obesity ([place] 01/18/2018  . Malignant neoplasm of upper-outer quadrant of right breast in female, estrogen receptor positive ([place] 01/17/2018  . Achilles tendinitis 11/28/2017  . Hot flashes 09/30/2017  . Hypertension 09/08/2017  . Contusion of left knee and lower leg 11/18/2015  . Mild intermittent asthma 08/15/2013  . BV (bacterial vaginosis) 08/11/2012  . Right knee pain 07/05/2012  . GERD (gastroesophageal reflux disease) 07/05/2012  . GC (gonococcus) 05/30/2012  . Chlamydia 05/30/2012  . Left knee pain 01/12/2012  . Encounter for long-term (current) use of high-risk medication 02/25/2011  . Preventative health care 02/14/2011  . VITAMIN D DEFICIENCY 01/30/2010  . ANEMIA-IRON DEFICIENCY 01/30/2010  . Edema 01/30/2010  . HIV (human immunodeficiency virus infection) ([place] 03/12/2009  . GENITAL HERPES 03/12/2009  . Anxiety state 11/27/2007  . 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 Hyperlipidemia 08/02/2007    TStark Bray12/03/2020, 2:48 PM  [place] [address]Phone: [phone]  Fax:  [phone] Name: MReilynn LauroMRN: [id number] of Birth: 508/09/1970

## 2020-07-04 ENCOUNTER — Telehealth: Payer: Self-pay | Admitting: Internal Medicine

## 2020-07-04 NOTE — Telephone Encounter (Signed)
   1.Medication Requested :ONETOUCH VERIO test strip pioglitazone (ACTOS) 45 MG tablet rosuvastatin (CRESTOR) 10 MG tablet glipiZIDE (GLUCOTROL XL) 10 MG 24 hr tablet  2. Pharmacy (Name, Street, City):[facility] [place], [place]  3. On Med List: yes  4. Last Visit with PCP: 10/03/19  5. Next visit date with PCP: 12/15/2   Agent: Please be advised that RX refills may take up to 3 business days. We ask that you follow-up with your pharmacy.

## 2020-07-07 ENCOUNTER — Other Ambulatory Visit: Payer: Self-pay

## 2020-07-07 MED ORDER — GLIPIZIDE ER 10 MG PO TB24
10.0000 mg | ORAL_TABLET | Freq: Every day | ORAL | 3 refills | Status: DC
Start: 2020-07-07 — End: 2020-07-16

## 2020-07-07 MED ORDER — ONETOUCH VERIO VI STRP: ORAL_STRIP | 5 refills | Status: DC

## 2020-07-07 MED ORDER — ROSUVASTATIN CALCIUM 10 MG PO TABS
10.0000 mg | ORAL_TABLET | Freq: Every day | ORAL | 3 refills | Status: DC
Start: 2020-07-07 — End: 2020-07-16

## 2020-07-07 MED ORDER — PIOGLITAZONE HCL 45 MG PO TABS
45.0000 mg | ORAL_TABLET | Freq: Every day | ORAL | 3 refills | Status: DC
Start: 2020-07-07 — End: 2020-07-16

## 2020-07-09 ENCOUNTER — Ambulatory Visit: Payer: Self-pay | Admitting: Internal Medicine

## 2020-07-09 ENCOUNTER — Ambulatory Visit: Payer: BC Managed Care – PPO | Admitting: Physical Therapy

## 2020-07-09 ENCOUNTER — Encounter: Payer: Self-pay | Admitting: Physical Therapy

## 2020-07-09 ENCOUNTER — Other Ambulatory Visit: Payer: Self-pay

## 2020-07-09 ENCOUNTER — Encounter: Payer: Self-pay | Admitting: Internal Medicine

## 2020-07-09 DIAGNOSIS — M25611 Stiffness of right shoulder, not elsewhere classified: Secondary | ICD-10-CM

## 2020-07-09 DIAGNOSIS — R293 Abnormal posture: Secondary | ICD-10-CM

## 2020-07-09 DIAGNOSIS — I890 Lymphedema, not elsewhere classified: Secondary | ICD-10-CM

## 2020-07-09 DIAGNOSIS — Z0289 Encounter for other administrative examinations: Secondary | ICD-10-CM

## 2020-07-09 DIAGNOSIS — G8929 Other chronic pain: Secondary | ICD-10-CM

## 2020-07-09 DIAGNOSIS — Z483 Aftercare following surgery for neoplasm: Secondary | ICD-10-CM

## 2020-07-09 DIAGNOSIS — C50411 Malignant neoplasm of upper-outer quadrant of right female breast: Secondary | ICD-10-CM

## 2020-07-09 DIAGNOSIS — Z170 Estrogen receptor positive status [ER+]: Secondary | ICD-10-CM

## 2020-07-09 NOTE — Therapy (Signed)
[place], [name], [address] Phone: [phone]   Fax:  [phone]  Physical Therapy Treatment  Patient Details  Name: Kelly Rowe MRN: [id number] Date of Birth: June 19, 1971 Referring Provider (PT): Dr. Barry Dienes    Encounter Date: 07/09/2020   PT End of Session - 07/09/20 0902    Visit Number 11    Number of Visits 23    Date for PT Re-Evaluation 08/06/20    PT Start Time 9597   pt arrived late   PT Stop Time 0857    PT Time Calculation (min) 40 min    Activity Tolerance Patient tolerated treatment well    Behavior During Therapy [facility] for tasks assessed/performed           Past Medical History:  Diagnosis Date  . ANEMIA-IRON DEFICIENCY 01/30/2010  . ANXIETY 11/27/2007  . Arthritis   . Asthma 02/25/2011  . Cancer ([place])    breast  . Carbuncle and furuncle of trunk 04/16/2010  . Chlamydia infection 03/21/2008  . DIABETES MELLITUS, TYPE II 08/02/2007  . Edema 01/30/2010  . ELEVATED BLOOD PRESSURE WITHOUT DIAGNOSIS OF HYPERTENSION 08/02/2007  . Family history of breast cancer   . Family history of colon cancer   . Family history of lung cancer   . FREQUENCY, URINARY 05/19/2009  . GENITAL HERPES 03/12/2009  . GERD (gastroesophageal reflux disease)   . History of kidney stones   . History of radiation therapy 07/27/18- 09/06/18   Right Breast, Right Axillary and Supraclavicular nodes 50 Gy in 25 fractions, Right Breast Boost 10 Gy in 5 fractions  . HIV (human immunodeficiency virus infection) ([place]) 2009  . HIV INFECTION 03/12/2009  . HSV (herpes simplex virus) infection   . HYPERLIPIDEMIA 08/02/2007  . Hypertension   . Metrorrhagia 03/21/2008  . PONV (postoperative nausea and vomiting)    woke up crying per patient   . RETENTION, URINE 05/19/2009  . Sleep apnea   . Trichomonas infection 01/19/2010  . VITAMIN D DEFICIENCY 01/30/2010    Past Surgical History:  Procedure Laterality Date  .  ABDOMINAL HYSTERECTOMY  07/22/2010   TAH WITH PRESERVATION OF BOTH TUBES AND OVARIES  . BREAST LUMPECTOMY Right 2019  . BREAST LUMPECTOMY WITH RADIOACTIVE SEED AND SENTINEL LYMPH NODE BIOPSY Right 02/23/2018   Procedure: BREAST LUMPECTOMY WITH RADIOACTIVE SEED AND SENTINEL LYMPH NODE BIOPSY;  Surgeon: Stark Klein, MD;  Location: [facility];  Service: General;  Laterality: Right;  . CHOLECYSTECTOMY    . ENDOMETRIAL ABLATION  01/11/2008   HER OPTION  . ESOPHAGOGASTRODUODENOSCOPY    . MULTIPLE TOOTH EXTRACTIONS    . PORT-A-CATH REMOVAL Left 05/08/2019   Procedure: REMOVAL PORT-A-CATH;  Surgeon: Stark Klein, MD;  Location: [facility];  Service: General;  Laterality: Left;  . PORTACATH PLACEMENT Left 02/23/2018   Procedure: INSERTION PORT-A-CATH;  Surgeon: Stark Klein, MD;  Location: [facility];  Service: General;  Laterality: Left;  . PORTACATH PLACEMENT N/A 04/21/2018   Procedure: PORT-A-CATH REVISION;  Surgeon: Stark Klein, MD;  [facility];  Service: General;  Laterality: N/A;  . TUBAL LIGATION    . WISDOM TOOTH EXTRACTION      There were no vitals filed for this visit.   Subjective Assessment - 07/09/20 0818    Subjective I fell going up my steps yesterday. I did not get hurt.    Pertinent History s/p Rt lumpectomy and SLNB 02/23/18 due to stage IB triple positive IDC 2 nodes taken pt  thinking both negative. Chemotherapy completed 07/05/18, Herceptin started with chemo and continues through August of this year.  Radiation 07/27/18-09/06/18 to the Rt breast, Antiestrogens to follow. Other medical history to include asthma, DM, HTN, HIV, and hysterectomy    Patient Stated Goals to get swelling down and be able to reach- wants to be able to wash herself in the shower    Currently in Pain? Yes    Pain Score 4     Pain Location --   diffuse joint pain due to fall   Pain Descriptors / Indicators Aching                     Flowsheet Row [facility]patient [facility]14/2020  [facility]  Lymphedema Life Impact Scale Total Score 89.71 %            [facility] Adult PT Treatment/Exercise - 07/09/20 0001      Manual Therapy   Edema Management pt reports she was not measured for sleeve and glove at time she was measured for bra, reached out to DME supplier and set pt up to be measured on 12/17 for sleeve and glove    Manual Lymphatic Drainage (MLD) short neck, 5 diaphragmatic breaths, superficial and deep abdominals, right inguinal nodes and establishment of axillo inguinal pathway, sternal nodes, and then R breast moving fluid towards pathways then retracing all steps. Focus on medial inferior breast with hardness/fibrosis evident under enlarged pores                       PT Long Term Goals - 07/09/20 0820      PT LONG TERM GOAL #1   Title Pt will demonstrate 140 degrees of R shoulder abduction to allow her to reach out to the side    Baseline 91; 06/11/20- 147    Time 4    Period Weeks    Status Achieved      PT LONG TERM GOAL #2   Title Pt will demonstrate 150 degrees of right shoulder flexion to allow her to reach items on the shelf    Baseline 127; 06/11/20- 127, 07/03/20: 162    Time 4    Period Weeks    Status Achieved      PT LONG TERM GOAL #3   Title Pt will demonstrate 40 degrees of R shoulder IR to allow her to reach back to fasten her bra and perform hygeine    Baseline 19; 06/11/20- 34; 07/09/20- 37    Time 4    Period Weeks    Status On-going      PT LONG TERM GOAL #4   Title Pt will be able to open a jar or a can without assistance from her son to decrease dependence on caregivers.    Baseline 07/09/20- pt reports she is able to open a jar    Time 4    Period Weeks    Status Achieved      PT LONG TERM GOAL #5   Title Pt will receive a compression bra, sleeve and glove for long term management of lymphedema.    Baseline 06/11/20- pt signed up to be measured 12/7; 07/09/20- pt is  awaiting arrival of compression bra    Time 4    Period Weeks    Status Partially Met      PT LONG TERM GOAL #6   Title Pt will be independent in a home exercise program for  strengthening and stretching    Time 4    Period Weeks    Status On-going      PT LONG TERM GOAL #7   Title Pt will demosntrate a 50% improvement in fibrosis present throughout R breast to decrease risk of cellulitis    Baseline 07/09/20- 10% improvement    Time 4    Period Weeks    Status On-going      PT LONG TERM GOAL #8   Title Pt will improve right grip strength (#2 setting) to atleast 34 lbs for improved ability to perform household chores    Baseline 16 lbs; 06/11/20- 30lbs, 07/03/20: 42    Time 5    Period Weeks    Status Achieved                 Plan - 07/09/20 0858    Clinical Impression Statement Assessed pt's progress towards goals in therapy. She is progressing towards all of her goals in therapy. Her R shoulder internal rotation continues to improve. She is awaiting arrival of a compression bra and the fitter did not measure her for a sleeve and glove so therapist reached out to schedule that on Friday. Pt is demonstrating softing of medial breast fibrosis and has been wearing the chip pack. Continued with MLD to R breast today. Pt would benefit from additional skilled PT services until pt recieves compression garments and is able to manage her lymphedema independently.    PT Frequency 2x / week    PT Duration 4 weeks    PT Treatment/Interventions ADLs/Self Care Home Management;Manual lymph drainage;Compression bandaging;Manual techniques;Taping;DME Instruction;Therapeutic exercise;Patient/family education;Passive range of motion;Scar mobilization;Therapeutic activities;Joint Manipulations;Iontophoresis 35m/ml Dexamethasone    PT Next Visit Plan cont Ionto prn, continue MLD of the right breast, PROM.    Recommended Other Services pt to be measured for sleeve and glove on 12/17- was measured  for bra on 12/7 to [facility] [employer]    Consulted and Agree with Plan of Care Patient           Patient will benefit from skilled therapeutic intervention in order to improve the following deficits and impairments:  Increased edema,Impaired UE functional use,Postural dysfunction,Pain,Decreased knowledge of use of DME,Decreased knowledge of precautions,Decreased scar mobility,Decreased range of motion,Decreased strength,Increased fascial restricitons  Visit Diagnosis: Lymphedema, not elsewhere classified  Stiffness of right shoulder, not elsewhere classified  Chronic right shoulder pain  Aftercare following surgery for neoplasm  Abnormal posture  Malignant neoplasm of upper-outer quadrant of right breast in female, estrogen receptor positive ([facility]     Problem List Patient Active Problem List   Diagnosis Date Noted  . Osteoarthritis of left knee 11/29/2019  . Lymphedema of right arm 10/11/2019  . Upper respiratory infection 07/02/2019  . Type 2 diabetes mellitus with other specified complication ([facility] 056/97/9480 . Hematochezia 03/30/2018  . Insomnia 03/30/2018  . Port-A-Cath in place 03/29/2018  . Genetic testing 01/30/2018  . Neoplasm of breast, regional lymph node staging category N3b: metastasis in ipsilateral internal mammary lymph node and axillary lymph node ([place] 01/30/2018  . Family history of breast cancer   . Family history of colon cancer   . Family history of lung cancer   . Morbid obesity ([facility] 01/18/2018  . Malignant neoplasm of upper-outer quadrant of right breast in female, estrogen receptor positive ([place] 01/17/2018  . Achilles tendinitis 11/28/2017  . Hot flashes 09/30/2017  . Hypertension 09/08/2017  . Contusion of left knee and lower leg 11/18/2015  . Mild  intermittent asthma 08/15/2013  . BV (bacterial vaginosis) 08/11/2012  . Right knee pain 07/05/2012  . GERD (gastroesophageal reflux disease) 07/05/2012  . GC (gonococcus) 05/30/2012  . Chlamydia  05/30/2012  . Left knee pain 01/12/2012  . Encounter for long-term (current) use of high-risk medication 02/25/2011  . Preventative health care 02/14/2011  . VITAMIN D DEFICIENCY 01/30/2010  . ANEMIA-IRON DEFICIENCY 01/30/2010  . Edema 01/30/2010  . HIV (human immunodeficiency virus infection) ([place]) 03/12/2009  . GENITAL HERPES 03/12/2009  . Anxiety state 11/27/2007  . 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 Hyperlipidemia 08/02/2007    Allyson Sabal [facility] 07/09/2020, 9:06 AM  [place], [name], [address] Phone: [phone]   Fax:  [phone]  Name: Jaimee Corum MRN: [id number] Date of Birth: 03/20/1971  Manus Gunning, PT 07/09/20 9:06 AM

## 2020-07-09 NOTE — Patient Instructions (Signed)
First of all, check with your insurance company to see if provider is in network    [facility] (for wigs and compression sleeves / gloves/gauntlets )  [address] [place], [place] [address] [phone]  Will file some insurances --- call for appointment   [facility] (for mastectomy prosthetics and garments) [address] [place], [place] [address] [phone] Will file some insurances --- call for appointment  [facility]  [address]  [place], [place] [address] [phone] Lower extremity garments  [facility] [address] [place], [place]  [address] [phone]   [facility] (for compression garments, especilly for lower extremities) [address] [place], [place]  [address] [phone] Call for appointment    Melissa Meares ,certified fitter [facility]  [phone]  [facility] (for mastectomy supplies and garments) [address] [place], [place] [address] [phone]  Other Resources: National Lymphedema Network:  www.lymphnet.org www.K[employer].com for patient articles and self manual lymph drainage information www.lymphedemablog.com has informative articles.  www.compressionguru.com www.lymphedemaproducts.com [email or web address] 

## 2020-07-10 ENCOUNTER — Ambulatory Visit: Payer: BC Managed Care – PPO

## 2020-07-10 DIAGNOSIS — I890 Lymphedema, not elsewhere classified: Secondary | ICD-10-CM | POA: Diagnosis not present

## 2020-07-10 DIAGNOSIS — M25611 Stiffness of right shoulder, not elsewhere classified: Secondary | ICD-10-CM

## 2020-07-10 DIAGNOSIS — Z170 Estrogen receptor positive status [ER+]: Secondary | ICD-10-CM

## 2020-07-10 DIAGNOSIS — R293 Abnormal posture: Secondary | ICD-10-CM

## 2020-07-10 DIAGNOSIS — G8929 Other chronic pain: Secondary | ICD-10-CM

## 2020-07-10 DIAGNOSIS — C50411 Malignant neoplasm of upper-outer quadrant of right female breast: Secondary | ICD-10-CM

## 2020-07-10 DIAGNOSIS — Z483 Aftercare following surgery for neoplasm: Secondary | ICD-10-CM
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
## 2020-07-15 ENCOUNTER — Ambulatory Visit: Payer: BC Managed Care – PPO | Admitting: Physical Therapy

## 2020-07-16 ENCOUNTER — Encounter: Payer: Self-pay | Admitting: Internal Medicine

## 2020-07-16 ENCOUNTER — Other Ambulatory Visit: Payer: Self-pay

## 2020-07-16 ENCOUNTER — Ambulatory Visit (INDEPENDENT_AMBULATORY_CARE_PROVIDER_SITE_OTHER): Payer: BC Managed Care – PPO | Admitting: Internal Medicine

## 2020-07-16 VITALS — BP 130/80 | HR 99 | Temp 98.7°F | Ht 62.0 in | Wt 261.0 lb

## 2020-07-16 DIAGNOSIS — E7849 Other hyperlipidemia: Secondary | ICD-10-CM

## 2020-07-16 DIAGNOSIS — E08319 Diabetes mellitus due to underlying condition with unspecified diabetic retinopathy without macular edema: Secondary | ICD-10-CM

## 2020-07-16 DIAGNOSIS — I10 Essential (primary) hypertension: Secondary | ICD-10-CM | POA: Diagnosis not present

## 2020-07-16 MED ORDER — GLIPIZIDE ER 10 MG PO TB24
10.0000 mg | ORAL_TABLET | Freq: Every day | ORAL | 3 refills | Status: DC
Start: 2020-07-16 — End: 2021-07-16

## 2020-07-16 MED ORDER — ZOLPIDEM TARTRATE 10 MG PO TABS
10.0000 mg | ORAL_TABLET | Freq: Every evening | ORAL | 1 refills | Status: DC | PRN
Start: 2020-07-16 — End: 2021-08-11

## 2020-07-16 MED ORDER — ALBUTEROL SULFATE HFA 108 (90 BASE) MCG/ACT IN AERS: 2.0000 | INHALATION_SPRAY | Freq: Four times a day (QID) | RESPIRATORY_TRACT | 11 refills | Status: DC | PRN

## 2020-07-16 MED ORDER — ROSUVASTATIN CALCIUM 10 MG PO TABS
10.0000 mg | ORAL_TABLET | Freq: Every day | ORAL | 3 refills | Status: DC
Start: 2020-07-16 — End: 2020-10-18

## 2020-07-16 MED ORDER — COLCHICINE 0.6 MG PO TABS
0.6000 mg | ORAL_TABLET | Freq: Every day | ORAL | 3 refills | Status: DC
Start: 2020-07-16 — End: 2022-08-17

## 2020-07-16 MED ORDER — TRULICITY 3 MG/0.5ML ~~LOC~~ SOAJ
3.0000 mg | SUBCUTANEOUS | 3 refills | Status: DC
Start: 2020-07-16 — End: 2021-08-11

## 2020-07-16 MED ORDER — BASAGLAR KWIKPEN 100 UNIT/ML ~~LOC~~ SOPN
10.0000 [IU] | PEN_INJECTOR | Freq: Every day | SUBCUTANEOUS | 11 refills | Status: DC
Start: 2020-07-16 — End: 2020-10-18

## 2020-07-16 MED ORDER — CARVEDILOL 6.25 MG PO TABS: 6.2500 mg | ORAL_TABLET | Freq: Two times a day (BID) | ORAL | 11 refills | Status: DC

## 2020-07-16 MED ORDER — PIOGLITAZONE HCL 45 MG PO TABS
45.0000 mg | ORAL_TABLET | Freq: Every day | ORAL | 3 refills | Status: DC
Start: 2020-07-16 — End: 2021-11-12

## 2020-07-16 MED ORDER — NITROGLYCERIN 0.2 MG/HR TD PT24: MEDICATED_PATCH | TRANSDERMAL | 1 refills | Status: AC

## 2020-07-16 MED ORDER — LOSARTAN POTASSIUM 25 MG PO TABS
25.0000 mg | ORAL_TABLET | Freq: Every day | ORAL | 11 refills | Status: DC
Start: 2020-07-16 — End: 2023-12-16

## 2020-07-16 NOTE — Progress Notes (Deleted)
   Subjective:    Patient ID: Kelly Rowe, female    DOB: [birth year], 49 y.o.   MRN: [id number]  HPI    Review of Systems     Objective:   Physical Exam        Assessment & Plan:

## 2020-07-16 NOTE — Patient Instructions (Signed)
Please continue all other medications as before, and refills have been done if requested -to restart your medications  Please have the pharmacy call with any other refills you may need.  Please continue your efforts at being more active, low cholesterol diet, and weight control.  Please keep your appointments with your specialists as you may have planned  Please make an Appointment to return in 3 months, or sooner if needed

## 2020-07-17 ENCOUNTER — Ambulatory Visit: Payer: BC Managed Care – PPO | Admitting: Nurse Practitioner

## 2020-07-17 ENCOUNTER — Encounter: Payer: Self-pay | Admitting: Nurse Practitioner

## 2020-07-17 ENCOUNTER — Encounter: Payer: Self-pay | Admitting: Physical Therapy

## 2020-07-17 VITALS — BP 136/84

## 2020-07-17 DIAGNOSIS — N898 Other specified noninflammatory disorders of vagina: Secondary | ICD-10-CM | POA: Diagnosis not present

## 2020-07-17 DIAGNOSIS — B373 Candidiasis of vulva and vagina: Secondary | ICD-10-CM | POA: Diagnosis not present

## 2020-07-17 DIAGNOSIS — B3731 Acute candidiasis of vulva and vagina: Secondary | ICD-10-CM

## 2020-07-17 LAB — WET PREP FOR TRICH, YEAST, CLUE

## 2020-07-17 MED ORDER — FLUCONAZOLE 200 MG PO TABS: 200.0000 mg | ORAL_TABLET | Freq: Every day | ORAL | 0 refills | Status: DC

## 2020-07-17 MED ORDER — NYSTATIN-TRIAMCINOLONE 100000-0.1 UNIT/GM-% EX OINT: 1.0000 "application " | TOPICAL_OINTMENT | Freq: Two times a day (BID) | CUTANEOUS | 1 refills | Status: DC

## 2020-07-17 NOTE — Progress Notes (Signed)
   Acute Office Visit  Subjective:    Patient ID: Kelly Rowe, female    DOB: 1970/08/17, 49 y.o.   MRN: [id number]   HPI 49 y.o. presents today for vulvar and rectal irritation. She was recently on an antibiotic for a dental problem and symptoms began after this. She took Diflucan she had at home but it did not improve, she then was prescribed another round of Diflucan by PCP with no improvement. She has a history of recurrent UTIs. Diabetic. She was in between insurances for a while and was not able to get her medications so she has been having higher than normal blood sugars.    Review of Systems  Constitutional: Negative.   Genitourinary: Positive for vaginal pain (generalized irritation/itching extending to perineum and anus). Negative for dysuria, genital sores and vaginal discharge.       Objective:    Physical Exam Constitutional:      Appearance: Normal appearance. She is obese.  Genitourinary:    Vagina: Vaginal discharge, erythema and tenderness present.     Comments: Generalized erythema and excoriation extending from vuvla to anus    BP 136/84   LMP 06/21/2010  Wt Readings from Last 3 Encounters:  07/16/20 261 lb (118.4 kg)  06/18/20 269 lb 6.4 oz (122.2 kg)  04/16/20 276 lb 12.8 oz (125.6 kg)   Wet prep + yeast /hyphae/psuedo hyphae     Assessment & Plan:   Problem List Items Addressed This Visit   None   Visit Diagnoses    Vaginal candidiasis    -  Primary   Relevant Medications   fluconazole (DIFLUCAN) 200 MG tablet   nystatin-triamcinolone ointment (MYCOLOG)   Vaginal itching       Relevant Medications   nystatin-triamcinolone ointment (MYCOLOG)   Other Relevant Orders   WET PREP FOR [place], YEAST, CLUE     Plan: This appears to be a severe case of candidiasis so we will treat with Diflucan 200 mg daily for 5 days. Mycolog ointment twice daily applied from vulvar to anus. Avoid friction/rubbing/scratching, keep area as dry as possible,  and use gentle wiping/drying. If symptoms worsen or do not improve she will return. She is agreeable to plan.      Tamela Gammon [facility], 1:04 PM 07/17/2020

## 2020-07-20 ENCOUNTER — Encounter: Payer: Self-pay | Admitting: Internal Medicine

## 2020-07-20 NOTE — Assessment & Plan Note (Signed)
Lab Results  Component Value Date   LDLCALC 107 (H) 10/03/2019  uncontrolled, goal < 70, declines statin for now

## 2020-07-20 NOTE — Assessment & Plan Note (Signed)
BP Readings from Last 3 Encounters:  07/17/20 136/84  07/16/20 130/80  06/18/20 126/73  stable overall by history and exam, recent data reviewed with pt, and pt to continue medical treatment as before,  to f/u any worsening symptoms or concerns, restart all meds

## 2020-07-20 NOTE — Progress Notes (Signed)
Established Patient Office Visit  Subjective:  Patient ID: Kelly Rowe, female    DOB: Mar 02, 1971  Age: 49 y.o. MRN: [id number]      Chief Complaint: (concise statement describing the symptom, problem, condition, diagnosis, physician recommended return, or other factor as reason for encounter) follow up HTN, HLD and hyperglycemia        HPI:  Kelly Rowe is a 49 y.o. female here to f/u; overall  Not doing well as she lost her insurance x 3 mo, and out of all meds x 1 mo, now with new job and insurance, wanting to restart meds,  Pt denies chest pain, increasing sob or doe, wheezing, orthopnea, PND, increased LE swelling, palpitations, dizziness or syncope.  Pt denies new neurological symptoms such as new headache, or facial or extremity weakness or numbness.  Pt does have polydipsia, polyuria, but no symptomatic low sugars. Pt mostly trying to follow appropriate diet, with wt overall down several lbs in the setting of uncontrolled sugars,  but little exercise however. Wt Readings from Last 3 Encounters:  07/16/20 261 lb (118.4 kg)  06/18/20 269 lb 6.4 oz (122.2 kg)  04/16/20 276 lb 12.8 oz (125.6 kg)   BP Readings from Last 3 Encounters:  07/17/20 136/84  07/16/20 130/80  06/18/20 126/73    Past Medical History:  Diagnosis Date  . ANEMIA-IRON DEFICIENCY 01/30/2010  . ANXIETY 11/27/2007  . Arthritis   . Asthma 02/25/2011  . Cancer ([place])    breast  . Carbuncle and furuncle of trunk 04/16/2010  . Chlamydia infection 03/21/2008  . DIABETES MELLITUS, TYPE II 08/02/2007  . Edema 01/30/2010  . ELEVATED BLOOD PRESSURE WITHOUT DIAGNOSIS OF HYPERTENSION 08/02/2007  . Family history of breast cancer   . Family history of colon cancer   . Family history of lung cancer   . FREQUENCY, URINARY 05/19/2009  . GENITAL HERPES 03/12/2009  . GERD (gastroesophageal reflux disease)   . History of kidney stones   . History of radiation therapy 07/27/18- 09/06/18   Right Breast, Right Axillary and  Supraclavicular nodes 50 Gy in 25 fractions, Right Breast Boost 10 Gy in 5 fractions  . HIV (human immunodeficiency virus infection) ([place]) 2009  . HIV INFECTION 03/12/2009  . HSV (herpes simplex virus) infection   . HYPERLIPIDEMIA 08/02/2007  . Hypertension   . Metrorrhagia 03/21/2008  . PONV (postoperative nausea and vomiting)    woke up crying per patient   . RETENTION, URINE 05/19/2009  . Sleep apnea   . Trichomonas infection 01/19/2010  . VITAMIN D DEFICIENCY 01/30/2010   Past Surgical History:  Procedure Laterality Date  . ABDOMINAL HYSTERECTOMY  07/22/2010   TAH WITH PRESERVATION OF BOTH TUBES AND OVARIES  . BREAST LUMPECTOMY Right 2019  . BREAST LUMPECTOMY WITH RADIOACTIVE SEED AND SENTINEL LYMPH NODE BIOPSY Right 02/23/2018   Procedure: BREAST LUMPECTOMY WITH RADIOACTIVE SEED AND SENTINEL LYMPH NODE BIOPSY;  Surgeon: Stark Klein, MD;  Location: [place];  Service: General;  Laterality: Right;  . CHOLECYSTECTOMY    . ENDOMETRIAL ABLATION  01/11/2008   HER OPTION  . ESOPHAGOGASTRODUODENOSCOPY    . MULTIPLE TOOTH EXTRACTIONS    . PORT-A-CATH REMOVAL Left 05/08/2019   Procedure: REMOVAL PORT-A-CATH;  Surgeon: Stark Klein, MD;  Location: [place];  Service: General;  Laterality: Left;  . PORTACATH PLACEMENT Left 02/23/2018   Procedure: INSERTION PORT-A-CATH;  Surgeon: Stark Klein, MD;  Location: [facility];  Service: General;  Laterality: Left;  . PORTACATH PLACEMENT N/A  04/21/2018   Procedure: PORT-A-CATH REVISION;  Surgeon: Stark Klein, MD;  Location[facility];  Service: General;  Laterality: N/A;  . TUBAL LIGATION    . WISDOM TOOTH EXTRACTION      reports that she has never smoked. She has never used smokeless tobacco. She reports current alcohol use. She reports that she does not use drugs. family history includes Breast cancer in her paternal aunt, paternal aunt, and paternal aunt; Cancer in her father; Colon cancer in her father, paternal grandfather, and another  family member; Diabetes in her father and mother; Heart attack (age of onset: 57) in her maternal grandfather; Heart disease in an other family member; Hypertension in her mother; Lung cancer in her paternal aunt; Lung cancer (age of onset: 1) in her maternal grandmother; Prostate cancer in an other family member; Stroke in her paternal uncle and another family member. Allergies  Allergen Reactions  . Metformin And Related Other (See Comments)    Bowel frequency  . Levaquin [Levofloxacin In D5w] Nausea And Vomiting and Rash  . Penicillins Swelling and Rash    Has patient had a PCN reaction causing immediate rash, facial/tongue/throat swelling, SOB or lightheadedness with hypotension: Yes Has patient had a PCN reaction causing severe rash involving mucus membranes or skin necrosis: No Has patient had a PCN reaction that required hospitalization: Yes Has patient had a PCN reaction occurring within the last 10 years: No If all of the above answers are "NO", then may proceed with Cephalosporin use.   Current Outpatient Medications on File Prior to Visit  Medication Sig Dispense Refill  . bictegravir-emtricitabine-tenofovir AF (BIKTARVY) 50-200-25 MG TABS tablet Take 1 tablet by mouth daily.    . blood glucose meter kit and supplies Dispense based on patient and insurance preference. Use up to four times daily as directed. (FOR ICD-10 E10.9, E11.9). 1 each 0  . Diclofenac Sodium (PENNSAID) 2 % SOLN Place 1 application onto the skin 2 (two) times daily. 112 g 2  . ferrous gluconate (FERGON) 240 (27 FE) MG tablet Take 240 mg by mouth daily as needed (iron).     . gabapentin (NEURONTIN) 300 MG capsule Take 1 capsule (300 mg total) by mouth at bedtime. 90 capsule 4  . glucose blood (ONETOUCH VERIO) test strip USE 1 STRIP TO CHECK GLUCOSE 4 TIMES DAILY AS DIRECTED. DX: E11.69 100 each 5  . hydrocortisone (ANUSOL-HC) 2.5 % rectal cream Place 1 application rectally 2 (two) times daily. 30 g 1  . ibuprofen  (ADVIL,MOTRIN) 800 MG tablet Take 1 tablet (800 mg total) by mouth every 8 (eight) hours as needed. 60 tablet 0  . Insulin Pen Needle (PEN NEEDLES) 32G X 4 MM MISC Use with insulin pen 100 each 12  . Lancets (ONETOUCH DELICA PLUS [id number]) MISC USE TO TEST BLOOD SUGAR UP TO 4 TIMES DAILY 100 each 5  . loratadine (CLARITIN) 10 MG tablet Take 10 mg by mouth daily.    . Multiple Vitamin (MULTIVITAMIN WITH MINERALS) TABS tablet Take 1 tablet by mouth daily.    . tamoxifen (NOLVADEX) 20 MG tablet Take 1 tablet (20 mg total) by mouth daily. 90 tablet 4  . valACYclovir (VALTREX) 500 MG tablet Take twice daily for 3-5 days 30 tablet 12  . venlafaxine XR (EFFEXOR-XR) 75 MG 24 hr capsule Take 1 capsule (75 mg total) by mouth daily with breakfast. 90 capsule 4   No current facility-administered medications on file prior to visit.  ROS:  All others reviewed and negative.  Objective        PE:  BP 130/80 (BP Location: Left Arm, Patient Position: Sitting, Cuff Size: Large)   Pulse 99   Temp 98.7 F (37.1 C) (Oral)   Ht 5' 2"  (1.575 m)   Wt 261 lb (118.4 kg)   LMP 06/21/2010   SpO2 96%   BMI 47.74 kg/m                 Constitutional: Pt appears in NAD               HENT: Head: NCAT.                Right Ear: External ear normal.                 Left Ear: External ear normal.                Eyes: . Pupils are equal, round, and reactive to light. Conjunctivae and EOM are normal               Nose: without d/c or deformity               Neck: Neck supple. Gross normal ROM               Cardiovascular: Normal rate and regular rhythm.                 Pulmonary/Chest: Effort normal and breath sounds without rales or wheezing.                Abd:  Soft, NT, ND, + BS, no organomegaly               Neurological: Pt is alert. At baseline orientation, motor grossly intact               Skin: Skin is warm. No rashes, no other new lesions, LE edema - none, but has rue lymphedema                Psychiatric: Pt behavior is normal without agitation   Assessment/Plan:  Kelly Rowe is a 49 y.o. Black or African American [2] female with  has a past medical history of ANEMIA-IRON DEFICIENCY (01/30/2010), ANXIETY (11/27/2007), Arthritis, Asthma (02/25/2011), Cancer ([place]), Carbuncle and furuncle of trunk (04/16/2010), Chlamydia infection (03/21/2008), DIABETES MELLITUS, TYPE II (08/02/2007), Edema (01/30/2010), ELEVATED BLOOD PRESSURE WITHOUT DIAGNOSIS OF HYPERTENSION (08/02/2007), Family history of breast cancer, Family history of colon cancer, Family history of lung cancer, FREQUENCY, URINARY (05/19/2009), GENITAL HERPES (03/12/2009), GERD (gastroesophageal reflux disease), History of kidney stones, History of radiation therapy (07/27/18- 09/06/18), HIV (human immunodeficiency virus infection) ([place]) (2009), HIV INFECTION (03/12/2009), HSV (herpes simplex virus) infection, HYPERLIPIDEMIA (08/02/2007), Hypertension, Metrorrhagia (03/21/2008), PONV (postoperative nausea and vomiting), RETENTION, URINE (05/19/2009), Sleep apnea, Trichomonas infection (01/19/2010), and VITAMIN D DEFICIENCY (01/30/2010).   Assessment Plan  See problem oriented assessment and plan Labs reviewed for each problem: Lab Results  Component Value Date   WBC 4.4 06/18/2020   HGB 14.1 06/18/2020   HCT 44.0 06/18/2020   PLT 205 06/18/2020   GLUCOSE 211 (H) 06/18/2020   CHOL 183 10/03/2019   TRIG 124.0 10/03/2019   HDL 50.60 10/03/2019   LDLDIRECT 143.0 10/03/2018   LDLCALC 107 (H) 10/03/2019   ALT 26 06/18/2020   AST 29 06/18/2020   NA 137 06/18/2020   K 4.5 06/18/2020   CL 101 06/18/2020   CREATININE 1.04 (  H) 06/18/2020   BUN 16 06/18/2020   CO2 20 (L) 06/18/2020   TSH 1.69 10/03/2019   HGBA1C 9.6 (H) 10/03/2019   MICROALBUR <0.7 10/03/2019    Micro: none  Cardiac tracings I have personally interpreted today:  none  Pertinent Radiological findings (summarize): recent mammogram dec 3   I spent total 34 minutes in  caring for the patient for this visit:  1) by communicating with the patient during the visit  2) by review of pertinent vital sign data, physical examination and labs as documented in the assessment and plan  3) by review of pertinent imaging - as above  4) by review of pertinent procedures - none today  5) by obtaining and reviewing separately obtained information from family/caretaker and Care Everywhere - none today  6) by ordering medications  7) by ordering tests  8) by documenting all of this clinical information in the EHR including the management of each problem noted today in assessment and plan  Problem List Items Addressed This Visit      Medium   Hypertension   Relevant Medications   carvedilol (COREG) 6.25 MG tablet   rosuvastatin (CRESTOR) 10 MG tablet   nitroGLYCERIN (NITRODUR - DOSED IN MG/24 HR) 0.2 mg/hr patch   losartan (COZAAR) 25 MG tablet   Hyperlipidemia   Relevant Medications   carvedilol (COREG) 6.25 MG tablet   rosuvastatin (CRESTOR) 10 MG tablet   nitroGLYCERIN (NITRODUR - DOSED IN MG/24 HR) 0.2 mg/hr patch   losartan (COZAAR) 25 MG tablet   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 glipiZIDE (GLUCOTROL XL) 10 MG 24 hr tablet    Sig: Take 1 tablet (10 mg total) by mouth daily with breakfast.    Dispense:  90 tablet    Refill:  3  . Dulaglutide (TRULICITY) 3 BD/5.3GD SOPN    Sig: Inject 3 mg into the skin once a week.    Dispense:  6 mL    Refill:  3  . pioglitazone (ACTOS) 45 MG tablet    Sig: Take 1  tablet (45 mg total) by mouth daily.    Dispense:  90 tablet    Refill:  3  . colchicine 0.6 MG tablet    Sig: Take 1 tablet (0.6 mg total) by mouth daily.    Dispense:  90 tablet    Refill:  3  . albuterol (VENTOLIN HFA) 108 (90 Base) MCG/ACT inhaler    Sig: Inhale 2 puffs into the lungs every 6 (six) hours as needed for wheezing or shortness of breath.    Dispense:  1 g    Refill:  11  . carvedilol (COREG) 6.25 MG tablet    Sig: Take 1 tablet (6.25 mg total) by mouth 2 (two) times daily.    Dispense:  60 tablet    Refill:  11  . zolpidem (AMBIEN) 10 MG tablet    Sig: Take 1 tablet (10 mg total) by mouth at bedtime as needed. for sleep    Dispense:  90 tablet  Refill:  1  . rosuvastatin (CRESTOR) 10 MG tablet    Sig: Take 1 tablet (10 mg total) by mouth daily.    Dispense:  90 tablet    Refill:  3  . nitroGLYCERIN (NITRODUR - DOSED IN MG/24 HR) 0.2 mg/hr patch    Sig: Apply 1/4 patch daily to tendon for tendonitis.    Dispense:  30 patch    Refill:  1  . losartan (COZAAR) 25 MG tablet    Sig: Take 1 tablet (25 mg total) by mouth daily.    Dispense:  30 tablet    Refill:  11  . Insulin Glargine (BASAGLAR KWIKPEN) 100 UNIT/ML    Sig: Inject 10 Units into the skin daily.    Dispense:  15 mL    Refill:  11    Follow-up: 3 mo     Cathlean Cower, MD 07/20/2020 9:11 PM [facility]

## 2020-07-20 NOTE — Assessment & Plan Note (Signed)
Uncontrolled, to restart meds, continue check cbgs at home, and f/u next visit with a1c

## 2020-07-24 ENCOUNTER — Encounter: Payer: Self-pay | Admitting: Internal Medicine

## 2020-07-24 NOTE — Progress Notes (Signed)
Pt left without  Being seen due to transportation

## 2020-07-24 NOTE — Patient Instructions (Signed)
Pt not seen.

## 2020-07-25 ENCOUNTER — Other Ambulatory Visit: Payer: Self-pay

## 2020-07-25 DIAGNOSIS — B3731 Acute candidiasis of vulva and vagina: Secondary | ICD-10-CM

## 2020-07-25 DIAGNOSIS — B373 Candidiasis of vulva and vagina: Secondary | ICD-10-CM

## 2020-07-28 ENCOUNTER — Other Ambulatory Visit: Payer: Self-pay

## 2020-07-28 ENCOUNTER — Ambulatory Visit: Payer: BC Managed Care – PPO | Attending: General Surgery | Admitting: Physical Therapy

## 2020-07-28 ENCOUNTER — Encounter: Payer: Self-pay | Admitting: Physical Therapy

## 2020-07-28 DIAGNOSIS — R293 Abnormal posture: Secondary | ICD-10-CM | POA: Insufficient documentation

## 2020-07-28 DIAGNOSIS — I890 Lymphedema, not elsewhere classified: Secondary | ICD-10-CM | POA: Diagnosis present

## 2020-07-28 DIAGNOSIS — Z483 Aftercare following surgery for neoplasm: Secondary | ICD-10-CM | POA: Diagnosis present

## 2020-07-28 DIAGNOSIS — Z170 Estrogen receptor positive status [ER+]: Secondary | ICD-10-CM | POA: Diagnosis present

## 2020-07-28 DIAGNOSIS — M25611 Stiffness of right shoulder, not elsewhere classified: Secondary | ICD-10-CM | POA: Diagnosis present

## 2020-07-28 DIAGNOSIS — G8929 Other chronic pain: Secondary | ICD-10-CM | POA: Diagnosis present

## 2020-07-28 DIAGNOSIS — C50411 Malignant neoplasm of upper-outer quadrant of right female breast: Secondary | ICD-10-CM | POA: Insufficient documentation

## 2020-07-28 DIAGNOSIS — M25511 Pain in right shoulder: Secondary | ICD-10-CM | POA: Insufficient documentation

## 2020-07-28 MED ORDER — FLUCONAZOLE 150 MG PO TABS: 150.0000 mg | ORAL_TABLET | ORAL | 0 refills | Status: AC

## 2020-07-28 NOTE — Telephone Encounter (Signed)
Last time she was prescribed a higher dose due to severity of infection.  I will send in Diflucan 150 mg tablet #2. She will take one today and the other in 3-5 days if symptoms persist.

## 2020-07-28 NOTE — Therapy (Signed)
[place], [name], [address] Phone: [phone]   Fax:  [phone]  Physical Therapy Treatment  Patient Details  Name: Kelly Rowe MRN: [id number] Date of Birth: 1970/09/05 Referring Provider (PT): Dr. Barry Dienes    Encounter Date: 07/28/2020   PT End of Session - 07/28/20 1007    Visit Number 13    Number of Visits 23    Date for PT Re-Evaluation 08/06/20    PT Start Time 0909    PT Stop Time 1000    PT Time Calculation (min) 51 min    Activity Tolerance Patient tolerated treatment well    Behavior During Therapy [facility] for tasks assessed/performed           Past Medical History:  Diagnosis Date  . ANEMIA-IRON DEFICIENCY 01/30/2010  . ANXIETY 11/27/2007  . Arthritis   . Asthma 02/25/2011  . Cancer ([place])    breast  . Carbuncle and furuncle of trunk 04/16/2010  . Chlamydia infection 03/21/2008  . DIABETES MELLITUS, TYPE II 08/02/2007  . Edema 01/30/2010  . ELEVATED BLOOD PRESSURE WITHOUT DIAGNOSIS OF HYPERTENSION 08/02/2007  . Family history of breast cancer   . Family history of colon cancer   . Family history of lung cancer   . FREQUENCY, URINARY 05/19/2009  . GENITAL HERPES 03/12/2009  . GERD (gastroesophageal reflux disease)   . History of kidney stones   . History of radiation therapy 07/27/18- 09/06/18   Right Breast, Right Axillary and Supraclavicular nodes 50 Gy in 25 fractions, Right Breast Boost 10 Gy in 5 fractions  . HIV (human immunodeficiency virus infection) ([place]) 2009  . HIV INFECTION 03/12/2009  . HSV (herpes simplex virus) infection   . HYPERLIPIDEMIA 08/02/2007  . Hypertension   . Metrorrhagia 03/21/2008  . PONV (postoperative nausea and vomiting)    woke up crying per patient   . RETENTION, URINE 05/19/2009  . Sleep apnea   . Trichomonas infection 01/19/2010  . VITAMIN D DEFICIENCY 01/30/2010    Past Surgical History:  Procedure Laterality Date  . ABDOMINAL HYSTERECTOMY   07/22/2010   TAH WITH PRESERVATION OF BOTH TUBES AND OVARIES  . BREAST LUMPECTOMY Right 2019  . BREAST LUMPECTOMY WITH RADIOACTIVE SEED AND SENTINEL LYMPH NODE BIOPSY Right 02/23/2018   Procedure: BREAST LUMPECTOMY WITH RADIOACTIVE SEED AND SENTINEL LYMPH NODE BIOPSY;  Surgeon: Stark Klein, MD;  Location: [place];  Service: General;  Laterality: Right;  . CHOLECYSTECTOMY    . ENDOMETRIAL ABLATION  01/11/2008   HER OPTION  . ESOPHAGOGASTRODUODENOSCOPY    . MULTIPLE TOOTH EXTRACTIONS    . PORT-A-CATH REMOVAL Left 05/08/2019   Procedure: REMOVAL PORT-A-CATH;  Surgeon: Stark Klein, MD;  Location: [place];  Service: General;  Laterality: Left;  . PORTACATH PLACEMENT Left 02/23/2018   Procedure: INSERTION PORT-A-CATH;  Surgeon: Stark Klein, MD;  Location: [place] [facility];  Service: General;  Laterality: Left;  . PORTACATH PLACEMENT N/A 04/21/2018   Procedure: PORT-A-CATH REVISION;  Surgeon: Stark Klein, MD;  [facility];  Service: General;  Laterality: N/A;  . TUBAL LIGATION    . WISDOM TOOTH EXTRACTION      There were no vitals filed for this visit.   Subjective Assessment - 07/28/20 0910    Subjective I received the glove but not the arm sleeve. The swelling is about the same. I can still reach with my arm and the pain in my shoulder is like a 1 and comes and goes.  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 Chemotherapy completed 07/05/18, Herceptin started with chemo and continues through August of this year.  Radiation 07/27/18-09/06/18 to the Rt breast, Antiestrogens to follow. Other medical history to include asthma, DM, HTN, HIV, and hysterectomy    Patient Stated Goals to get swelling down and be able to reach- wants to be able to wash herself in the shower    Currently in Pain? Yes    Pain Score 1     Pain Location Shoulder    Pain Orientation Right    Pain Descriptors / Indicators  Aching    Pain Type Chronic pain    Pain Onset More than a month ago    Pain Frequency Intermittent                     Flowsheet Row Outpatient Rehab from 05/14/2020 in [place]  Lymphedema Life Impact Scale Total Score 89.71 %            [facility] Adult PT Treatment/Exercise - 07/28/20 0001      Manual Therapy   Edema Management assessed pt's compression bra for fit. It was too loose and provided hardly any compression to left breast. The fabric was gapping away from her breast. Sent email to DME supplier about fit.    Manual Lymphatic Drainage (MLD) short neck, 5 diaphragmatic breaths, superficial and deep abdominals, right inguinal nodes and establishment of axillo inguinal pathway, sternal nodes, and then R breast moving fluid towards pathways then retracing all steps. Focus on medial inferior breast with hardness/fibrosis evident under enlarged pores                       PT Long Term Goals - 07/09/20 0820      PT LONG TERM GOAL #1   Title Pt will demonstrate 140 degrees of R shoulder abduction to allow her to reach out to the side    Baseline 91; 06/11/20- 147    Time 4    Period Weeks    Status Achieved      PT LONG TERM GOAL #2   Title Pt will demonstrate 150 degrees of right shoulder flexion to allow her to reach items on the shelf    Baseline 127; 06/11/20- 127, 07/03/20: 162    Time 4    Period Weeks    Status Achieved      PT LONG TERM GOAL #3   Title Pt will demonstrate 40 degrees of R shoulder IR to allow her to reach back to fasten her bra and perform hygeine    Baseline 19; 06/11/20- 34; 07/09/20- 37    Time 4    Period Weeks    Status On-going      PT LONG TERM GOAL #4   Title Pt will be able to open a jar or a can without assistance from her son to decrease dependence on caregivers.    Baseline 07/09/20- pt reports she is able to open a jar    Time 4    Period Weeks    Status Achieved       PT LONG TERM GOAL #5   Title Pt will receive a compression bra, sleeve and glove for long term management of lymphedema.    Baseline 06/11/20- pt signed up to be measured 12/7; 07/09/20- pt is awaiting arrival of compression bra  Time 4    Period Weeks    Status Partially Met      PT LONG TERM GOAL #6   Title Pt will be independent in a home exercise program for strengthening and stretching    Time 4    Period Weeks    Status On-going      PT LONG TERM GOAL #7   Title Pt will demosntrate a 50% improvement in fibrosis present throughout R breast to decrease risk of cellulitis    Baseline 07/09/20- 10% improvement    Time 4    Period Weeks    Status On-going      PT LONG TERM GOAL #8   Title Pt will improve right grip strength (#2 setting) to atleast 34 lbs for improved ability to perform household chores    Baseline 16 lbs; 06/11/20- 30lbs, 07/03/20: 42    Time 5    Period Weeks    Status Achieved                 Plan - 07/28/20 1009    Clinical Impression Statement Pt brought in her compression bra that she received from DME supplier. Therapist assessed fit of bra and it was too large. The fabric gapped away from her breast. Signed pt up to be remeasured for a better fitting compression bra. Pt wanted to focus on R breast swelling today since this has been bothering her. She had fibrosis in inferior breast so spent extra time on this area to soften it with noticeable improvement by end of session.    PT Frequency 2x / week    PT Duration 4 weeks    PT Treatment/Interventions ADLs/Self Care Home Management;Manual lymph drainage;Compression bandaging;Manual techniques;Taping;DME Instruction;Therapeutic exercise;Patient/family education;Passive range of motion;Scar mobilization;Therapeutic activities;Joint Manipulations;Iontophoresis 28m/ml Dexamethasone    PT Next Visit Plan cont Ionto prn, continue MLD of the right breast, PROM. pulleys- eventually teach strength ABC     Consulted and Agree with Plan of Care Patient           Patient will benefit from skilled therapeutic intervention in order to improve the following deficits and impairments:  Increased edema,Impaired UE functional use,Postural dysfunction,Pain,Decreased knowledge of use of DME,Decreased knowledge of precautions,Decreased scar mobility,Decreased range of motion,Decreased strength,Increased fascial restricitons  Visit Diagnosis: Lymphedema, not elsewhere classified     Problem List Patient Active Problem List   Diagnosis Date Noted  . Osteoarthritis of left knee 11/29/2019  . Lymphedema of right arm 10/11/2019  . Upper respiratory infection 07/02/2019  . Type 2 diabetes mellitus with other specified complication ([place] 027/12/2374 . Hematochezia 03/30/2018  . Insomnia 03/30/2018  . Port-A-Cath in place 03/29/2018  . Genetic testing 01/30/2018  . Neoplasm of breast, regional lymph node staging category N3b: metastasis in ipsilateral internal mammary lymph node and axillary lymph node ([place] 01/30/2018  . Family history of breast cancer   . Family history of colon cancer   . Family history of lung cancer   . [place]bid obesity ([place] [place] [facility] 01/18/2018  . Malignant neoplasm of upper-outer quadrant of right breast in female, estrogen receptor positive ([place] 01/17/2018  . Achilles tendinitis 11/28/2017  . Hot flashes 09/30/2017  . Hypertension 09/08/2017  . Contusion of left knee and lower leg 11/18/2015  . Mild intermittent asthma 08/15/2013  . BV (bacterial vaginosis) 08/11/2012  . Right knee pain 07/05/2012  . GERD (gastroesophageal reflux disease) 07/05/2012  . GC (gonococcus) 05/30/2012  . Chlamydia 05/30/2012  . Left knee pain 01/12/2012  .  Encounter for long-term (current) use of high-risk medication 02/25/2011  . Preventative health care 02/14/2011  . VITAMIN D DEFICIENCY 01/30/2010  . ANEMIA-IRON DEFICIENCY 01/30/2010  . Edema 01/30/2010  . HIV (human immunodeficiency virus  infection) ([place]) 03/12/2009  . GENITAL HERPES 03/12/2009  . Anxiety state 11/27/2007  . 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 Hyperlipidemia 08/02/2007    Allyson Sabal [facility] 07/28/2020, 10:12 AM  [place], [name], [address] Phone: [phone]   Fax:  [phone]  Name: Timeka Goette MRN: [id number] Date of Birth: 22-May-1971  Manus Gunning, PT 07/28/20 10:15 AM

## 2020-07-28 NOTE — Telephone Encounter (Signed)
Kelly Rowe patient had message attached to refill request stating " Patient comment: My treatment was successful is just that I ate some cake and ice cream that didn't agree with me and started it back again"  Just wanted to make sure you saw it.

## 2020-07-31 ENCOUNTER — Encounter: Payer: Self-pay | Admitting: Physical Therapy

## 2020-07-31 ENCOUNTER — Other Ambulatory Visit: Payer: Self-pay

## 2020-07-31 ENCOUNTER — Ambulatory Visit: Payer: BC Managed Care – PPO | Admitting: Physical Therapy

## 2020-07-31 DIAGNOSIS — G8929 Other chronic pain: Secondary | ICD-10-CM

## 2020-07-31 DIAGNOSIS — I890 Lymphedema, not elsewhere classified: Secondary | ICD-10-CM

## 2020-07-31 DIAGNOSIS — M25611 Stiffness of right shoulder, not elsewhere classified: Secondary | ICD-10-CM

## 2020-07-31 DIAGNOSIS — M25511 Pain in right shoulder: Secondary | ICD-10-CM

## 2020-07-31 NOTE — Therapy (Signed)
[place], [name], [address] Phone: [phone]   Fax:  [phone]  Physical Therapy Treatment  Patient Details  Name: Kelly Rowe MRN: [id number] Date of Birth: [birth year] Referring Provider (PT): Dr. Barry Dienes    Encounter Date: 07/31/2020   PT End of Session - 07/31/20 1356    Visit Number 14    Number of Visits 23    Date for PT Re-Evaluation 08/06/20    PT Start Time 2951    PT Stop Time 8841    PT Time Calculation (min) 43 min    Activity Tolerance Patient tolerated treatment well    Behavior During Therapy [facility] for tasks assessed/performed           Past Medical History:  Diagnosis Date  . ANEMIA-IRON DEFICIENCY 01/30/2010  . ANXIETY 11/27/2007  . Arthritis   . Asthma 02/25/2011  . Cancer ([place])    breast  . Carbuncle and furuncle of trunk 04/16/2010  . Chlamydia infection 03/21/2008  . DIABETES MELLITUS, TYPE II 08/02/2007  . Edema 01/30/2010  . ELEVATED BLOOD PRESSURE WITHOUT DIAGNOSIS OF HYPERTENSION 08/02/2007  . Family history of breast cancer   . Family history of colon cancer   . Family history of lung cancer   . FREQUENCY, URINARY 05/19/2009  . GENITAL HERPES 03/12/2009  . GERD (gastroesophageal reflux disease)   . History of kidney stones   . History of radiation therapy 07/27/18- 09/06/18   Right Breast, Right Axillary and Supraclavicular nodes 50 Gy in 25 fractions, Right Breast Boost 10 Gy in 5 fractions  . HIV (human immunodeficiency virus infection) ([place]) 2009  . HIV INFECTION 03/12/2009  . HSV (herpes simplex virus) infection   . HYPERLIPIDEMIA 08/02/2007  . Hypertension   . Metrorrhagia 03/21/2008  . PONV (postoperative nausea and vomiting)    woke up crying per patient   . RETENTION, URINE 05/19/2009  . Sleep apnea   . Trichomonas infection 01/19/2010  . VITAMIN D DEFICIENCY 01/30/2010    Past Surgical History:  Procedure Laterality Date  . ABDOMINAL HYSTERECTOMY   07/22/2010   TAH WITH PRESERVATION OF BOTH TUBES AND OVARIES  . BREAST LUMPECTOMY Right 2019  . BREAST LUMPECTOMY WITH RADIOACTIVE SEED AND SENTINEL LYMPH NODE BIOPSY Right 02/23/2018   Procedure: BREAST LUMPECTOMY WITH RADIOACTIVE SEED AND SENTINEL LYMPH NODE BIOPSY;  Surgeon: Stark Klein, MD;  Location: [place];  Service: General;  Laterality: Right;  . CHOLECYSTECTOMY    . ENDOMETRIAL ABLATION  01/11/2008   HER OPTION  . ESOPHAGOGASTRODUODENOSCOPY    . MULTIPLE TOOTH EXTRACTIONS    . PORT-A-CATH REMOVAL Left 05/08/2019   Procedure: REMOVAL PORT-A-CATH;  Surgeon: Stark Klein, MD;  Location: [place];  Service: General;  Laterality: Left;  . PORTACATH PLACEMENT Left 02/23/2018   Procedure: INSERTION PORT-A-CATH;  Surgeon: Stark Klein, MD;  Location: [place];  Service: General;  Laterality: Left;  . PORTACATH PLACEMENT N/A 04/21/2018   Procedure: PORT-A-CATH REVISION;  Surgeon: Stark Klein, MD;  [facility];  Service: General;  Laterality: N/A;  . TUBAL LIGATION    . WISDOM TOOTH EXTRACTION      There were no vitals filed for this visit.   Subjective Assessment - 07/31/20 1313    Subjective The breast feels ok. The shoulder hurts from doing stuff around the house. I had to move carpet. I had to change the filters in the vents.    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 Chemotherapy completed 07/05/18, Herceptin started with chemo and continues through August of this year.  Radiation 07/27/18-09/06/18 to the Rt breast, Antiestrogens to follow. Other medical history to include asthma, DM, HTN, HIV, and hysterectomy    Patient Stated Goals to get swelling down and be able to reach- wants to be able to wash herself in the shower    Currently in Pain? Yes    Pain Location Shoulder    Pain Orientation Right    Pain Descriptors / Indicators Sharp    Pain Type Acute pain    Pain Onset  Yesterday    Pain Frequency Constant    Aggravating Factors  moving the arm, using the arm    Pain Relieving Factors massage and rest    Effect of Pain on Daily Activities hurts to move arm                     Flowsheet Row [facility]patient [facility]  Lymphedema Life Impact Scale Total Score 89.71 %            [facility] Adult PT Treatment/Exercise - 07/31/20 0001      Iontophoresis   Type of Iontophoresis Dexamethasone    Location Right prox biceps    Dose 4 mg/ml    Time 4-6 hr      Manual Therapy   Manual Lymphatic Drainage (MLD) short neck, 5 diaphragmatic breaths, superficial and deep abdominals, right inguinal nodes and establishment of axillo inguinal pathway, sternal nodes, and then R breast moving fluid towards pathways then retracing all steps. Focus on medial inferior breast with hardness/fibrosis evident under enlarged pores    Passive ROM to R shoulder in direction of flexion, abduction and ER with prolonged holds for stretching                       PT Long Term Goals - 07/09/20 0820      PT LONG TERM GOAL #1   Title Pt will demonstrate 140 degrees of R shoulder abduction to allow her to reach out to the side    Baseline 91; 06/11/20- 147    Time 4    Period Weeks    Status Achieved      PT LONG TERM GOAL #2   Title Pt will demonstrate 150 degrees of right shoulder flexion to allow her to reach items on the shelf    Baseline 127; 06/11/20- 127, 07/03/20: 162    Time 4    Period Weeks    Status Achieved      PT LONG TERM GOAL #3   Title Pt will demonstrate 40 degrees of R shoulder IR to allow her to reach back to fasten her bra and perform hygeine    Baseline 19; 06/11/20- 34; 07/09/20- 37    Time 4    Period Weeks    Status On-going      PT LONG TERM GOAL #4   Title Pt will be able to open a jar or a can without assistance from her son to decrease dependence on caregivers.     Baseline 07/09/20- pt reports she is able to open a jar    Time 4    Period Weeks    Status Achieved      PT LONG TERM GOAL #5   Title Pt will receive a compression bra, sleeve  and glove for long term management of lymphedema.    Baseline 06/11/20- pt signed up to be measured 12/7; 07/09/20- pt is awaiting arrival of compression bra    Time 4    Period Weeks    Status Partially Met      PT LONG TERM GOAL #6   Title Pt will be independent in a home exercise program for strengthening and stretching    Time 4    Period Weeks    Status On-going      PT LONG TERM GOAL #7   Title Pt will demosntrate a 50% improvement in fibrosis present throughout R breast to decrease risk of cellulitis    Baseline 07/09/20- 10% improvement    Time 4    Period Weeks    Status On-going      PT LONG TERM GOAL #8   Title Pt will improve right grip strength (#2 setting) to atleast 34 lbs for improved ability to perform household chores    Baseline 16 lbs; 06/11/20- 30lbs, 07/03/20: 42    Time 5    Period Weeks    Status Achieved                 Plan - 07/31/20 1357    Clinical Impression Statement Pt reports that she has been cleaning out a closet since yesterday and feels she overdid it since her R shoulder has been hurting since she has done that. Applied ionto today to R shoulder at prox bicep to help decrease pain. Continued with MLD to R breast to reduce swelling and fibrosis. She will be measured for a new compression bra on Friday.    PT Frequency 2x / week    PT Duration 4 weeks    PT Treatment/Interventions ADLs/Self Care Home Management;Manual lymph drainage;Compression bandaging;Manual techniques;Taping;DME Instruction;Therapeutic exercise;Patient/family education;Passive range of motion;Scar mobilization;Therapeutic activities;Joint Manipulations;Iontophoresis 64m/ml Dexamethasone    PT Next Visit Plan cont Ionto prn, continue MLD of the right breast, PROM. pulleys- eventually teach  strength ABC    Consulted and Agree with Plan of Care Patient           Patient will benefit from skilled therapeutic intervention in order to improve the following deficits and impairments:  Increased edema,Impaired UE functional use,Postural dysfunction,Pain,Decreased knowledge of use of DME,Decreased knowledge of precautions,Decreased scar mobility,Decreased range of motion,Decreased strength,Increased fascial restricitons  Visit Diagnosis: Lymphedema, not elsewhere classified  Stiffness of right shoulder, not elsewhere classified  Chronic right shoulder pain     Problem List Patient Active Problem List   Diagnosis Date Noted  . Osteoarthritis of left knee 11/29/2019  . Lymphedema of right arm 10/11/2019  . Upper respiratory infection 07/02/2019  . Type 2 diabetes mellitus with other specified complication ([place] 012/16/2446 . Hematochezia 03/30/2018  . Insomnia 03/30/2018  . Port-A-Cath in place 03/29/2018  . Genetic testing 01/30/2018  . Neoplasm of breast, regional lymph node staging category N3b: metastasis in ipsilateral internal mammary lymph node and axillary lymph node ([place] [place] 01/30/2018  . Family history of breast cancer   . Family history of colon cancer   . Family history of lung cancer   . Morbid obesity ([place] 01/18/2018  . Malignant neoplasm of upper-outer quadrant of right breast in female, estrogen receptor positive ([place] 01/17/2018  . Achilles tendinitis 11/28/2017  . Hot flashes 09/30/2017  . Hypertension 09/08/2017  . Contusion of left knee and lower leg 11/18/2015  . Mild intermittent asthma 08/15/2013  . BV (bacterial vaginosis) 08/11/2012  .  Right knee pain 07/05/2012  . GERD (gastroesophageal reflux disease) 07/05/2012  . GC (gonococcus) 05/30/2012  . Chlamydia 05/30/2012  . Left knee pain 01/12/2012  . Encounter for long-term (current) use of high-risk medication 02/25/2011  . Preventative health care 02/14/2011  . VITAMIN D DEFICIENCY  01/30/2010  . ANEMIA-IRON DEFICIENCY 01/30/2010  . Edema 01/30/2010  . HIV (human immunodeficiency virus infection) ([place]) 03/12/2009  . GENITAL HERPES 03/12/2009  . Anxiety state 11/27/2007  . 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 Hyperlipidemia 08/02/2007    [facility] 07/31/2020, 1:59 PM  [place], [name], [address] Phone: [phone]   Fax:  [phone]  Name: Kelly Rowe MRN: [id number] Date of Birth: [birth year]  Manus Gunning, PT 07/31/20 1:59 PM

## 2020-08-04 ENCOUNTER — Other Ambulatory Visit: Payer: Self-pay

## 2020-08-04 ENCOUNTER — Encounter: Payer: Self-pay | Admitting: Physical Therapy

## 2020-08-04 ENCOUNTER — Ambulatory Visit: Payer: BC Managed Care – PPO | Admitting: Physical Therapy

## 2020-08-04 DIAGNOSIS — I890 Lymphedema, not elsewhere classified: Secondary | ICD-10-CM

## 2020-08-04 NOTE — Therapy (Signed)
[place], [name], [address] Phone: [phone]   Fax:  [phone]  Physical Therapy Treatment  Patient Details  Name: Kelly Rowe MRN: [id number] Date of Birth: Nov 29, 1970 Referring Provider (PT): Dr. Barry Dienes    Encounter Date: 08/04/2020   PT End of Session - 08/04/20 1454    Visit Number 15    Number of Visits 23    Date for PT Re-Evaluation 08/06/20    PT Start Time 7353    PT Stop Time 1454   not all time billable b/c pt was on phone   PT Time Calculation (min) 39 min    Activity Tolerance Patient tolerated treatment well    Behavior During Therapy [facility] for tasks assessed/performed           Past Medical History:  Diagnosis Date  . ANEMIA-IRON DEFICIENCY 01/30/2010  . ANXIETY 11/27/2007  . Arthritis   . Asthma 02/25/2011  . Cancer ([place])    breast  . Carbuncle and furuncle of trunk 04/16/2010  . Chlamydia infection 03/21/2008  . DIABETES MELLITUS, TYPE II 08/02/2007  . Edema 01/30/2010  . ELEVATED BLOOD PRESSURE WITHOUT DIAGNOSIS OF HYPERTENSION 08/02/2007  . Family history of breast cancer   . Family history of colon cancer   . Family history of lung cancer   . FREQUENCY, URINARY 05/19/2009  . GENITAL HERPES 03/12/2009  . GERD (gastroesophageal reflux disease)   . History of kidney stones   . History of radiation therapy 07/27/18- 09/06/18   Right Breast, Right Axillary and Supraclavicular nodes 50 Gy in 25 fractions, Right Breast Boost 10 Gy in 5 fractions  . HIV (human immunodeficiency virus infection) ([place]) 2009  . HIV INFECTION 03/12/2009  . HSV (herpes simplex virus) infection   . HYPERLIPIDEMIA 08/02/2007  . Hypertension   . Metrorrhagia 03/21/2008  . PONV (postoperative nausea and vomiting)    woke up crying per patient   . RETENTION, URINE 05/19/2009  . Sleep apnea   . Trichomonas infection 01/19/2010  . VITAMIN D DEFICIENCY 01/30/2010    Past Surgical History:  Procedure  Laterality Date  . ABDOMINAL HYSTERECTOMY  07/22/2010   TAH WITH PRESERVATION OF BOTH TUBES AND OVARIES  . BREAST LUMPECTOMY Right 2019  . BREAST LUMPECTOMY WITH RADIOACTIVE SEED AND SENTINEL LYMPH NODE BIOPSY Right 02/23/2018   Procedure: BREAST LUMPECTOMY WITH RADIOACTIVE SEED AND SENTINEL LYMPH NODE BIOPSY;  Surgeon: Stark Klein, MD;  Location: [place];  Service: General;  Laterality: Right;  . CHOLECYSTECTOMY    . ENDOMETRIAL ABLATION  01/11/2008   HER OPTION  . ESOPHAGOGASTRODUODENOSCOPY    . MULTIPLE TOOTH EXTRACTIONS    . PORT-A-CATH REMOVAL Left 05/08/2019   Procedure: REMOVAL PORT-A-CATH;  Surgeon: Stark Klein, MD;  Location: [place];  Service: General;  Laterality: Left;  . PORTACATH PLACEMENT Left 02/23/2018   Procedure: INSERTION PORT-A-CATH;  Surgeon: Stark Klein, MD;  Location: [place];  Service: General;  Laterality: Left;  . PORTACATH PLACEMENT N/A 04/21/2018   Procedure: PORT-A-CATH REVISION;  Surgeon: Stark Klein, MD;  [facility];  Service: General;  Laterality: N/A;  . TUBAL LIGATION    . WISDOM TOOTH EXTRACTION      There were no vitals filed for this visit.   Subjective Assessment - 08/04/20 1415    Subjective When I came Friday my arm was swollen. When she measured it was like 3 cm larger. The swelling and pain feel better now. I think the sleeve  is tight but I got it up.    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 Chemotherapy completed 07/05/18, Herceptin started with chemo and continues through August of this year.  Radiation 07/27/18-09/06/18 to the Rt breast, Antiestrogens to follow. Other medical history to include asthma, DM, HTN, HIV, and hysterectomy    Patient Stated Goals to get swelling down and be able to reach- wants to be able to wash herself in the shower    Currently in Pain? No/denies    Pain Score 0-No pain                      Flowsheet Row Outpatient Rehab from 05/14/2020 in [place]  Lymphedema Life Impact Scale Total Score 89.71 %            [facility] Adult PT Treatment/Exercise - 08/04/20 0001      Manual Therapy   Edema Management pt reported the DME company is ordering her a pad for breast and that it will help the bra fit better    Manual Lymphatic Drainage (MLD) short neck, 5 diaphragmatic breaths, superficial and deep abdominals, right inguinal nodes and establishment of axillo inguinal pathway, sternal nodes, and then R breast and RUE moving fluid towards pathways then retracing all steps. Focus on medial inferior breast with hardness/fibrosis evident under enlarged pores                       PT Long Term Goals - 07/09/20 0820      PT LONG TERM GOAL #1   Title Pt will demonstrate 140 degrees of R shoulder abduction to allow her to reach out to the side    Baseline 91; 06/11/20- 147    Time 4    Period Weeks    Status Achieved      PT LONG TERM GOAL #2   Title Pt will demonstrate 150 degrees of right shoulder flexion to allow her to reach items on the shelf    Baseline 127; 06/11/20- 127, 07/03/20: 162    Time 4    Period Weeks    Status Achieved      PT LONG TERM GOAL #3   Title Pt will demonstrate 40 degrees of R shoulder IR to allow her to reach back to fasten her bra and perform hygeine    Baseline 19; 06/11/20- 34; 07/09/20- 37    Time 4    Period Weeks    Status On-going      PT LONG TERM GOAL #4   Title Pt will be able to open a jar or a can without assistance from her son to decrease dependence on caregivers.    Baseline 07/09/20- pt reports she is able to open a jar    Time 4    Period Weeks    Status Achieved      PT LONG TERM GOAL #5   Title Pt will receive a compression bra, sleeve and glove for long term management of lymphedema.    Baseline 06/11/20- pt signed up to be measured 12/7; 07/09/20- pt  is awaiting arrival of compression bra    Time 4    Period Weeks    Status Partially Met      PT LONG TERM GOAL #6   Title Pt will be independent in a home exercise program for strengthening and stretching  Time 4    Period Weeks    Status On-going      PT LONG TERM GOAL #7   Title Pt will demosntrate a 50% improvement in fibrosis present throughout R breast to decrease risk of cellulitis    Baseline 07/09/20- 10% improvement    Time 4    Period Weeks    Status On-going      PT LONG TERM GOAL #8   Title Pt will improve right grip strength (#2 setting) to atleast 34 lbs for improved ability to perform household chores    Baseline 16 lbs; 06/11/20- 30lbs, 07/03/20: 42    Time 5    Period Weeks    Status Achieved                 Plan - 08/04/20 1456    Clinical Impression Statement Pt reports her shoulder pain is doing better today and she has been not been over using her RUE. She reports her R upper arm is more swollen causing her sleeve to fit more tighly. Spent session today focusing on MLD to R breast and UE to help reduce edema and fibrosis. Pt is awaiting arrival of a compression pad for her bra to help add additional compression on her R breast in her bra.    PT Frequency 2x / week    PT Duration 4 weeks    PT Treatment/Interventions ADLs/Self Care Home Management;Manual lymph drainage;Compression bandaging;Manual techniques;Taping;DME Instruction;Therapeutic exercise;Patient/family education;Passive range of motion;Scar mobilization;Therapeutic activities;Joint Manipulations;Iontophoresis 35m/ml Dexamethasone    PT Next Visit Plan cont Ionto prn, continue MLD of the right breast, PROM. pulleys- eventually teach strength ABC    Consulted and Agree with Plan of Care Patient           Patient will benefit from skilled therapeutic intervention in order to improve the following deficits and impairments:  Increased edema,Impaired UE functional use,Postural  dysfunction,Pain,Decreased knowledge of use of DME,Decreased knowledge of precautions,Decreased scar mobility,Decreased range of motion,Decreased strength,Increased fascial restricitons  Visit Diagnosis: Lymphedema, not elsewhere classified     Problem List Patient Active Problem List   Diagnosis Date Noted  . Osteoarthritis of left knee 11/29/2019  . Lymphedema of right arm 10/11/2019  . Upper respiratory infection 07/02/2019  . Type 2 diabetes mellitus with other specified complication ([facility] 040/98/1191 . Hematochezia 03/30/2018  . Insomnia 03/30/2018  . Port-A-Cath in place 03/29/2018  . Genetic testing 01/30/2018  . 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 01/30/2018  . Family history of breast cancer   . Family history of colon cancer   . Family history of lung cancer   . Morbid obesity ([place] 01/18/2018  . Malignant neoplasm of upper-outer quadrant of right breast in female, estrogen receptor positive ([place] 01/17/2018  . Achilles tendinitis 11/28/2017  . Hot flashes 09/30/2017  . Hypertension 09/08/2017  . Contusion of left knee and lower leg 11/18/2015  . Mild intermittent asthma 08/15/2013  . BV (bacterial vaginosis) 08/11/2012  . Right knee pain 07/05/2012  . GERD (gastroesophageal reflux disease) 07/05/2012  . GC (gonococcus) 05/30/2012  . Chlamydia 05/30/2012  . Left knee pain 01/12/2012  . Encounter for long-term (current) use of high-risk medication 02/25/2011  . Preventative health care 02/14/2011  . VITAMIN D DEFICIENCY 01/30/2010  . ANEMIA-IRON DEFICIENCY 01/30/2010  . Edema 01/30/2010  . HIV (human immunodeficiency virus infection) ([place] 03/12/2009  . GENITAL HERPES 03/12/2009  .  Anxiety state 11/27/2007  . 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 Hyperlipidemia 08/02/2007    [facility] 08/04/2020, 2:58 PM  [place], [name], [address] Phone: [phone]   Fax:  [phone]  Name: Kelly Rowe MRN: [id number] Date of Birth: 1971/02/22  Manus Gunning, PT 08/04/20 2:58 PM

## 2020-08-06 ENCOUNTER — Encounter: Payer: Self-pay | Admitting: Physical Therapy

## 2020-08-06 ENCOUNTER — Ambulatory Visit: Payer: BC Managed Care – PPO | Admitting: Physical Therapy

## 2020-08-06 ENCOUNTER — Other Ambulatory Visit: Payer: Self-pay

## 2020-08-06 DIAGNOSIS — I890 Lymphedema, not elsewhere classified: Secondary | ICD-10-CM | POA: Diagnosis not present

## 2020-08-06 DIAGNOSIS — G8929 Other chronic pain: Secondary | ICD-10-CM

## 2020-08-06 DIAGNOSIS — R293 Abnormal posture: Secondary | ICD-10-CM

## 2020-08-06 DIAGNOSIS — Z483 Aftercare following surgery for neoplasm: Secondary | ICD-10-CM

## 2020-08-06 DIAGNOSIS — M25611 Stiffness of right shoulder, not elsewhere classified: Secondary | ICD-10-CM

## 2020-08-06 DIAGNOSIS — M25511 Pain in right shoulder: Secondary | ICD-10-CM

## 2020-08-06 DIAGNOSIS — C50411 Malignant neoplasm of upper-outer quadrant of right female breast: Secondary | ICD-10-CM

## 2020-08-06 NOTE — Therapy (Signed)
[place], [name], [address] Phone: [phone]   Fax:  [phone]  Physical Therapy Treatment  Patient Details  Name: Kelly Rowe MRN: [id number] Date of Birth: 01-06-1971 Referring Provider (PT): Dr. Barry Dienes    Encounter Date: 08/06/2020   PT End of Session - 08/06/20 1410    Visit Number 16    Number of Visits 23    Date for PT Re-Evaluation 09/03/20    PT Start Time 5732    PT Stop Time 1410    PT Time Calculation (min) 67 min    Activity Tolerance Patient tolerated treatment well    Behavior During Therapy [facility] for tasks assessed/performed           Past Medical History:  Diagnosis Date  . ANEMIA-IRON DEFICIENCY 01/30/2010  . ANXIETY 11/27/2007  . Arthritis   . Asthma 02/25/2011  . Cancer ([place])    breast  . Carbuncle and furuncle of trunk 04/16/2010  . Chlamydia infection 03/21/2008  . DIABETES MELLITUS, TYPE II 08/02/2007  . Edema 01/30/2010  . ELEVATED BLOOD PRESSURE WITHOUT DIAGNOSIS OF HYPERTENSION 08/02/2007  . Family history of breast cancer   . Family history of colon cancer   . Family history of lung cancer   . FREQUENCY, URINARY 05/19/2009  . GENITAL HERPES 03/12/2009  . GERD (gastroesophageal reflux disease)   . History of kidney stones   . History of radiation therapy 07/27/18- 09/06/18   Right Breast, Right Axillary and Supraclavicular nodes 50 Gy in 25 fractions, Right Breast Boost 10 Gy in 5 fractions  . HIV (human immunodeficiency virus infection) ([place]) 2009  . HIV INFECTION 03/12/2009  . HSV (herpes simplex virus) infection   . HYPERLIPIDEMIA 08/02/2007  . Hypertension   . Metrorrhagia 03/21/2008  . PONV (postoperative nausea and vomiting)    woke up crying per patient   . RETENTION, URINE 05/19/2009  . Sleep apnea   . Trichomonas infection 01/19/2010  . VITAMIN D DEFICIENCY 01/30/2010    Past Surgical History:  Procedure Laterality Date  . ABDOMINAL HYSTERECTOMY   07/22/2010   TAH WITH PRESERVATION OF BOTH TUBES AND OVARIES  . BREAST LUMPECTOMY Right 2019  . BREAST LUMPECTOMY WITH RADIOACTIVE SEED AND SENTINEL LYMPH NODE BIOPSY Right 02/23/2018   Procedure: BREAST LUMPECTOMY WITH RADIOACTIVE SEED AND SENTINEL LYMPH NODE BIOPSY;  Surgeon: Stark Klein, MD;  Location: [place];  Service: General;  Laterality: Right;  . CHOLECYSTECTOMY    . ENDOMETRIAL ABLATION  01/11/2008   HER OPTION  . ESOPHAGOGASTRODUODENOSCOPY    . MULTIPLE TOOTH EXTRACTIONS    . PORT-A-CATH REMOVAL Left 05/08/2019   Procedure: REMOVAL PORT-A-CATH;  Surgeon: Stark Klein, MD;  Location: [place];  Service: General;  Laterality: Left;  . PORTACATH PLACEMENT Left 02/23/2018   Procedure: INSERTION PORT-A-CATH;  Surgeon: Stark Klein, MD;  Location: [place];  Service: General;  Laterality: Left;  . PORTACATH PLACEMENT N/A 04/21/2018   Procedure: PORT-A-CATH REVISION;  Surgeon: Stark Klein, MD;  [facility];  Service: General;  Laterality: N/A;  . TUBAL LIGATION    . WISDOM TOOTH EXTRACTION      There were no vitals filed for this visit.   Subjective Assessment - 08/06/20 1306    Subjective The shoulder is still throbbing. I feel the pain off and on. 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 Chemotherapy completed 07/05/18, Herceptin started with chemo and continues through August of this year.  Radiation 07/27/18-09/06/18 to the Rt breast, Antiestrogens to follow. Other medical history to include asthma, DM, HTN, HIV, and hysterectomy    Patient Stated Goals to get swelling down and be able to reach- wants to be able to wash herself in the shower    Currently in Pain? Yes    Pain Score 2     Pain Location Shoulder    Pain Orientation Right    Pain Descriptors / Indicators Aching    Pain Type Acute pain    Pain Onset 1 to 4 weeks ago    Pain  Frequency Intermittent                     Flowsheet Row Outpatient Rehab from 05/14/2020 in [place]  Lymphedema Life Impact Scale Total Score 89.71 %            [facility] Adult PT Treatment/Exercise - 08/06/20 0001      Iontophoresis   Type of Iontophoresis Dexamethasone    Location Right prox biceps    Dose 4 mg/ml    Time 4-6 hr      Manual Therapy   Soft tissue mobilization to insertion of rotator cuff tendons at right upper arm    Manual Lymphatic Drainage (MLD) short neck, 5 diaphragmatic breaths, superficial and deep abdominals, right inguinal nodes and establishment of axillo inguinal pathway, sternal nodes, and then R breast and RUE moving fluid towards pathways then retracing all steps. Focus on medial inferior breast with hardness/fibrosis evident under enlarged pores                       PT Long Term Goals - 08/06/20 1411      PT LONG TERM GOAL #1   Title Pt will demonstrate 140 degrees of R shoulder abduction to allow her to reach out to the side    Baseline 91; 06/11/20- 147    Time 4    Period Weeks    Status Achieved      PT LONG TERM GOAL #2   Title Pt will demonstrate 150 degrees of right shoulder flexion to allow her to reach items on the shelf    Baseline 127; 06/11/20- 127, 07/03/20: 162    Time 4    Period Weeks    Status Achieved      PT LONG TERM GOAL #3   Title Pt will demonstrate 40 degrees of R shoulder IR to allow her to reach back to fasten her bra and perform hygeine    Baseline 19; 06/11/20- 34; 07/09/20- 37    Time 4    Period Weeks    Status On-going      PT LONG TERM GOAL #4   Title Pt will be able to open a jar or a can without assistance from her son to decrease dependence on caregivers.    Baseline 07/09/20- pt reports she is able to open a jar    Time 4    Period Weeks    Status Achieved      PT LONG TERM GOAL #5   Title Pt will receive a compression bra, sleeve  and glove for long term management of lymphedema.    Baseline 06/11/20- pt signed up to be measured 12/7; 07/09/20- pt is awaiting arrival of compression bra; 08/06/20- pt received compression bra bu  it does not fit correctly, DME supplier is ordering a pad to go in the bra to reduce fibrosis and also have a better fit    Time 4    Period Weeks    Status On-going      Additional Long Term Goals   Additional Long Term Goals Yes      PT LONG TERM GOAL #6   Title Pt will be independent in a home exercise program for strengthening and stretching    Time 4    Period Weeks    Status On-going      PT LONG TERM GOAL #7   Title Pt will demosntrate a 50% improvement in fibrosis present throughout R breast to decrease risk of cellulitis    Baseline 07/09/20- 10% improvement; 08/06/20- fibrosis is still present, awaiting pad to go in bra to decrease fibrosis    Time 4    Period Weeks    Status On-going      PT LONG TERM GOAL #8   Title Pt will improve right grip strength (#2 setting) to atleast 34 lbs for improved ability to perform household chores    Baseline 16 lbs; 06/11/20- 30lbs, 07/03/20: 42    Time 5    Period Weeks    Status Achieved      PT LONG TERM GOAL  #9   TITLE Pt will report a 50% improvement in R shoulder pain to allow pt improved comfort.    Time 4    Period Weeks    Status New    Target Date 09/03/20                 Plan - 08/06/20 1415    Clinical Impression Statement Pt is progressing towards goals in therapy. She has received a compression bra but it does not fit well and gaps away from her breast. DME supplier is sending pt a pad to go in her bra to help decrease fibrosis. Pt has been having increased R shoulder pain since she cleaned out a closet in her house so focus has been placed on decreasing pain through manual therapy and iontophoresis. Pt reports it is helping. Continuing MLD to pt's R breast and UE since she has been having increased swelling. Pt would  benefit from additional skilled PT visits to decrease lymphedema and decrease R shoulder pain.    PT Frequency 2x / week    PT Duration 4 weeks    PT Treatment/Interventions ADLs/Self Care Home Management;Manual lymph drainage;Compression bandaging;Manual techniques;Taping;DME Instruction;Therapeutic exercise;Patient/family education;Passive range of motion;Scar mobilization;Therapeutic activities;Joint Manipulations;Iontophoresis 4mg /ml Dexamethasone    PT Next Visit Plan cont Ionto prn, continue MLD of the right breast, PROM. pulleys- eventually teach strength ABC    Consulted and Agree with Plan of Care Patient           Patient will benefit from skilled therapeutic intervention in order to improve the following deficits and impairments:  Increased edema,Impaired UE functional use,Postural dysfunction,Pain,Decreased knowledge of use of DME,Decreased knowledge of precautions,Decreased scar mobility,Decreased range of motion,Decreased strength,Increased fascial restricitons  Visit Diagnosis: Lymphedema, not elsewhere classified  Stiffness of right shoulder, not elsewhere classified  Chronic right shoulder pain  Aftercare following surgery for neoplasm  Abnormal posture  Malignant neoplasm of upper-outer quadrant of right breast in female, estrogen receptor positive ([place])     Problem List Patient Active Problem List   Diagnosis Date Noted  . Osteoarthritis of left knee 11/29/2019  . Lymphedema of right arm 10/11/2019  .  Upper respiratory infection 07/02/2019  . Type 2 diabetes mellitus with other specified complication ([place]) 65/99/3570  . Hematochezia 03/30/2018  . Insomnia 03/30/2018  . Port-A-Cath in place 03/29/2018  . Genetic testing 01/30/2018  . Neoplasm of breast, regional lymph node staging category N3b: metastasis in ipsilateral internal mammary lymph node and axillary lymph node ([place]) 01/30/2018  . Family history of breast cancer   . Family history of colon cancer    . Family history of lung cancer   . Morbid obesity ([place]) 01/18/2018  . Malignant neoplasm of upper-outer quadrant of right breast in female, estrogen receptor positive ([place]) 01/17/2018  . Achilles tendinitis 11/28/2017  . Hot flashes 09/30/2017  . Hypertension 09/08/2017  . Contusion of left knee and lower leg 11/18/2015  . Mild intermittent asthma 08/15/2013  . BV (bacterial vaginosis) 08/11/2012  . Right knee pain 07/05/2012  . GERD (gastroesophageal reflux disease) 07/05/2012  . GC (gonococcus) 05/30/2012  . Chlamydia 05/30/2012  . Left knee pain 01/12/2012  . Encounter for long-term (current) use of high-risk medication 02/25/2011  . Preventative health care 02/14/2011  . VITAMIN D DEFICIENCY 01/30/2010  . ANEMIA-IRON DEFICIENCY 01/30/2010  . Edema 01/30/2010  . HIV (human immunodeficiency virus infection) ([place]) 03/12/2009  . GENITAL HERPES 03/12/2009  . Anxiety state 11/27/2007  . 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 Hyperlipidemia 08/02/2007    Allyson Sabal [facility] 08/06/2020, 2:19 PM  [place], [name], [address] Phone: [phone]   Fax:  [phone]  Name: Gissella Niblack MRN: [id number] Date of Birth: 17-Dec-1970  Manus Gunning, PT 08/06/20 2:19 PM

## 2020-08-11 ENCOUNTER — Ambulatory Visit: Payer: BC Managed Care – PPO | Admitting: Physical Therapy

## 2020-08-13 ENCOUNTER — Encounter: Payer: Self-pay | Admitting: Physical Therapy

## 2020-08-13 ENCOUNTER — Ambulatory Visit: Payer: BC Managed Care – PPO | Admitting: Physical Therapy

## 2020-08-13 ENCOUNTER — Telehealth: Payer: Self-pay

## 2020-08-13 ENCOUNTER — Other Ambulatory Visit: Payer: Self-pay

## 2020-08-13 DIAGNOSIS — I890 Lymphedema, not elsewhere classified: Secondary | ICD-10-CM | POA: Diagnosis not present

## 2020-08-13 DIAGNOSIS — M25611 Stiffness of right shoulder, not elsewhere classified: Secondary | ICD-10-CM

## 2020-08-13 DIAGNOSIS — G8929 Other chronic pain: Secondary | ICD-10-CM

## 2020-08-13 NOTE — Telephone Encounter (Signed)
Prior Authorization completed for trulicity

## 2020-08-13 NOTE — Therapy (Signed)
[place], [name], [address] Phone: [phone]   Fax:  [phone]  Physical Therapy Treatment  Patient Details  Name: Kelly Rowe MRN: [id number] Date of Birth: [birth year] Referring Provider (PT): Dr. Barry Dienes    Encounter Date: 08/13/2020   PT End of Session - 08/13/20 1357    Visit Number 17    Number of Visits 23    Date for PT Re-Evaluation 09/03/20    PT Start Time 1312    PT Stop Time 1357    PT Time Calculation (min) 45 min    Activity Tolerance Patient tolerated treatment well    Behavior During Therapy [facility] for tasks assessed/performed           Past Medical History:  Diagnosis Date  . ANEMIA-IRON DEFICIENCY 01/30/2010  . ANXIETY 11/27/2007  . Arthritis   . Asthma 02/25/2011  . Cancer ([place])    breast  . Carbuncle and furuncle of trunk 04/16/2010  . Chlamydia infection 03/21/2008  . DIABETES MELLITUS, TYPE II 08/02/2007  . Edema 01/30/2010  . ELEVATED BLOOD PRESSURE WITHOUT DIAGNOSIS OF HYPERTENSION 08/02/2007  . Family history of breast cancer   . Family history of colon cancer   . Family history of lung cancer   . FREQUENCY, URINARY 05/19/2009  . GENITAL HERPES 03/12/2009  . GERD (gastroesophageal reflux disease)   . History of kidney stones   . History of radiation therapy 07/27/18- 09/06/18   Right Breast, Right Axillary and Supraclavicular nodes 50 Gy in 25 fractions, Right Breast Boost 10 Gy in 5 fractions  . HIV (human immunodeficiency virus infection) ([place]) 2009  . HIV INFECTION 03/12/2009  . HSV (herpes simplex virus) infection   . HYPERLIPIDEMIA 08/02/2007  . Hypertension   . Metrorrhagia 03/21/2008  . PONV (postoperative nausea and vomiting)    woke up crying per patient   . RETENTION, URINE 05/19/2009  . Sleep apnea   . Trichomonas infection 01/19/2010  . VITAMIN D DEFICIENCY 01/30/2010    Past Surgical History:  Procedure Laterality Date  . ABDOMINAL HYSTERECTOMY   07/22/2010   TAH WITH PRESERVATION OF BOTH TUBES AND OVARIES  . BREAST LUMPECTOMY Right 2019  . BREAST LUMPECTOMY WITH RADIOACTIVE SEED AND SENTINEL LYMPH NODE BIOPSY Right 02/23/2018   Procedure: BREAST LUMPECTOMY WITH RADIOACTIVE SEED AND SENTINEL LYMPH NODE BIOPSY;  Surgeon: Stark Klein, MD;  Location: [place];  Service: General;  Laterality: Right;  . CHOLECYSTECTOMY    . ENDOMETRIAL ABLATION  01/11/2008   HER OPTION  . ESOPHAGOGASTRODUODENOSCOPY    . MULTIPLE TOOTH EXTRACTIONS    . PORT-A-CATH REMOVAL Left 05/08/2019   Procedure: REMOVAL PORT-A-CATH;  Surgeon: Stark Klein, MD;  Location: [place];  Service: General;  Laterality: Left;  . PORTACATH PLACEMENT Left 02/23/2018   Procedure: INSERTION PORT-A-CATH;  Surgeon: Stark Klein, MD;  Location: [place];  Service: General;  Laterality: Left;  . PORTACATH PLACEMENT N/A 04/21/2018   Procedure: PORT-A-CATH REVISION;  Surgeon: Stark Klein, MD;  [facility];  Service: General;  Laterality: N/A;  . TUBAL LIGATION    . WISDOM TOOTH EXTRACTION      There were no vitals filed for this visit.   Subjective Assessment - 08/13/20 1314    Subjective I am not even going to lie. My shoulder hurts because I had to shovel snow where it came down off the roof in front of the front door.    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 Chemotherapy completed 07/05/18, Herceptin started with chemo and continues through August of this year.  Radiation 07/27/18-09/06/18 to the Rt breast, Antiestrogens to follow. Other medical history to include asthma, DM, HTN, HIV, and hysterectomy    Patient Stated Goals to get swelling down and be able to reach- wants to be able to wash herself in the shower    Currently in Pain? Yes    Pain Score 6     Pain Location Shoulder    Pain Orientation Right    Pain Descriptors / Indicators Aching    Pain Type Acute pain     Pain Onset Yesterday    Pain Frequency Constant                     Flowsheet Row [facility]  Lymphedema Life Impact Scale Total Score 89.71 %            [facility] Adult PT Treatment/Exercise - 08/13/20 0001      Iontophoresis   Type of Iontophoresis Dexamethasone    Location insertion of rotator cuff tendons on R    Dose 4 mg/ml    Time 4-6 hr      Manual Therapy   Soft tissue mobilization to insertion of rotator cuff tendons at right upper arm and along deltoid    Passive ROM to R shoulder in direction of flexion, abduction and ER with prolonged holds for stretching                       PT Long Term Goals - 08/06/20 1411      PT LONG TERM GOAL #1   Title Pt will demonstrate 140 degrees of R shoulder abduction to allow her to reach out to the side    Baseline 91; 06/11/20- 147    Time 4    Period Weeks    Status Achieved      PT LONG TERM GOAL #2   Title Pt will demonstrate 150 degrees of right shoulder flexion to allow her to reach items on the shelf    Baseline 127; 06/11/20- 127, 07/03/20: 162    Time 4    Period Weeks    Status Achieved      PT LONG TERM GOAL #3   Title Pt will demonstrate 40 degrees of R shoulder IR to allow her to reach back to fasten her bra and perform hygeine    Baseline 19; 06/11/20- 34; 07/09/20- 37    Time 4    Period Weeks    Status On-going      PT LONG TERM GOAL #4   Title Pt will be able to open a jar or a can without assistance from her son to decrease dependence on caregivers.    Baseline 07/09/20- pt reports she is able to open a jar    Time 4    Period Weeks    Status Achieved      PT LONG TERM GOAL #5   Title Pt will receive a compression bra, sleeve and glove for long term management of lymphedema.    Baseline 06/11/20- pt signed up to be measured 12/7; 07/09/20- pt is awaiting arrival of compression bra; 08/06/20- pt received  compression bra bu it does not fit correctly, DME supplier is ordering a pad to go in the bra to reduce  fibrosis and also have a better fit    Time 4    Period Weeks    Status On-going      Additional Long Term Goals   Additional Long Term Goals Yes      PT LONG TERM GOAL #6   Title Pt will be independent in a home exercise program for strengthening and stretching    Time 4    Period Weeks    Status On-going      PT LONG TERM GOAL #7   Title Pt will demosntrate a 50% improvement in fibrosis present throughout R breast to decrease risk of cellulitis    Baseline 07/09/20- 10% improvement; 08/06/20- fibrosis is still present, awaiting pad to go in bra to decrease fibrosis    Time 4    Period Weeks    Status On-going      PT LONG TERM GOAL #8   Title Pt will improve right grip strength (#2 setting) to atleast 34 lbs for improved ability to perform household chores    Baseline 16 lbs; 06/11/20- 30lbs, 07/03/20: 42    Time 5    Period Weeks    Status Achieved      PT LONG TERM GOAL  #9   TITLE Pt will report a 50% improvement in R shoulder pain to allow pt improved comfort.    Time 4    Period Weeks    Status New    Target Date 09/03/20                 Plan - 08/13/20 1358    Clinical Impression Statement Pt has not received her compression pad for her bra yet and plans on reaching out to DME supplier to see when it should arrive. Pt shoveled snow yesterday and has been having R right shoulder pain since then. She reports that her shoulder pain had improved until she had to shovel the snow. Focused on manual therapy including massage and PROM to R shoulder to decrease pain followed by ionto to insertion of rotator cuff tendons.    PT Frequency 2x / week    PT Duration 4 weeks    PT Treatment/Interventions ADLs/Self Care Home Management;Manual lymph drainage;Compression bandaging;Manual techniques;Taping;DME Instruction;Therapeutic exercise;Patient/family education;Passive  range of motion;Scar mobilization;Therapeutic activities;Joint Manipulations;Iontophoresis 4mg /ml Dexamethasone    PT Next Visit Plan cont Ionto prn, continue MLD of the right breast, PROM. pulleys- eventually teach strength ABC    Consulted and Agree with Plan of Care Patient           Patient will benefit from skilled therapeutic intervention in order to improve the following deficits and impairments:  Increased edema,Impaired UE functional use,Postural dysfunction,Pain,Decreased knowledge of use of DME,Decreased knowledge of precautions,Decreased scar mobility,Decreased range of motion,Decreased strength,Increased fascial restricitons  Visit Diagnosis: Stiffness of right shoulder, not elsewhere classified  Chronic right shoulder pain     Problem List Patient Active Problem List   Diagnosis Date Noted  . Osteoarthritis of left knee 11/29/2019  . Lymphedema of right arm 10/11/2019  . Upper respiratory infection 07/02/2019  . Type 2 diabetes mellitus with other specified complication ([place]) 25/42/7062  . Hematochezia 03/30/2018  . Insomnia 03/30/2018  . Port-A-Cath in place 03/29/2018  . Genetic testing 01/30/2018  . Neoplasm of breast, regional lymph node staging category N3b: metastasis in ipsilateral internal mammary lymph node and axillary lymph node ([place]) 01/30/2018  . Family history of breast cancer   . Family history of colon cancer   .  Family history of lung cancer   . Morbid obesity ([place]) 01/18/2018  . Malignant neoplasm of upper-outer quadrant of right breast in female, estrogen receptor positive ([place]) 01/17/2018  . Achilles tendinitis 11/28/2017  . Hot flashes 09/30/2017  . Hypertension 09/08/2017  . Contusion of left knee and lower leg 11/18/2015  . Mild intermittent asthma 08/15/2013  . BV (bacterial vaginosis) 08/11/2012  . Right knee pain 07/05/2012  . GERD (gastroesophageal reflux disease) 07/05/2012  . GC (gonococcus) 05/30/2012  . Chlamydia 05/30/2012  .  Left knee pain 01/12/2012  . Encounter for long-term (current) use of high-risk medication 02/25/2011  . Preventative health care 02/14/2011  . VITAMIN D DEFICIENCY 01/30/2010  . ANEMIA-IRON DEFICIENCY 01/30/2010  . Edema 01/30/2010  . HIV (human immunodeficiency virus infection) ([place]) 03/12/2009  . GENITAL HERPES 03/12/2009  . Anxiety state 11/27/2007  . 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 Hyperlipidemia 08/02/2007    Allyson Sabal [facility] 08/13/2020, 2:01 PM  [place], [name], [address] Phone: [phone]   Fax:  [phone]  Name: Kelly Rowe MRN: [id number] Date of Birth: August 11, 1970  Manus Gunning, PT 08/13/20 2:01 PM

## 2020-08-15 ENCOUNTER — Telehealth: Payer: Self-pay | Admitting: Internal Medicine

## 2020-08-15 NOTE — Telephone Encounter (Signed)
Kelly Rowe already refilled dec 2021

## 2020-08-15 NOTE — Telephone Encounter (Signed)
We recevied fax that trulicity no longer covered under pt plan  Ok to call pharmacy [facility] on [address] to ask which similar medication is covered so I dont have to guess, or ask pt to do the same by asking at the pharmacy

## 2020-08-15 NOTE — Telephone Encounter (Signed)
Patient informed to contact pharmacy and see what is likely to be covered by the insurance plan.

## 2020-08-18 NOTE — Progress Notes (Signed)
[place]  Telephone:([phone]) [phone] [phone]    ID: Kelly Rowe DOB: 01/01/1971  MR#: [id number]  ITG#:[id number]  Patient Care Team: Biagio Borg, MD as PCP - [name], MD as Consulting Physician (General Surgery) Melek Pownall, [name], MD as Consulting Physician (Oncology) [name], MD as Attending Physician (Radiation Oncology) Marlinda Mike, PA-C as Referring Physician (Physician Assistant) Gregor Hams, MD as Consulting Physician (Family Medicine) OTHER MD:   CHIEF COMPLAINT: Triple positive breast cancer  CURRENT TREATMENT:  tamoxifen   INTERVAL HISTORY: Kelly Rowe returns today for follow-up of her triple positive breast cancer.  She continues on Tamoxifen with good tolerance.  Hot flashes are better on gabapentin although she still has significant problems with insomnia which may or may not be related to the tamoxifen.  Since her last visit, she underwent bilateral diagnostic mammography with tomography at [facility] on 06/27/2020 showing: breast density category A; no evidence of malignancy in either breast.    REVIEW OF SYSTEMS: Kelly Rowe continues to have significant issues with lymphedema.  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  She has applied for disability.  Detailed review of systems today was otherwise stable   COVID 19 VACCINATION STATUS: Refuses vaccination   HISTORY OF CURRENT ILLNESS: From the original intake note:  "Kelly Rowe" noticed bruising in her lateral right breast and palpated a mass  on 12/23/2017. She followed up with her gynecologist. She underwent unilateral right diagnostic mammography with tomography and right breast ultrasonography at [facility] on 01/05/2018 showing: breast density  category B. There is an irregular highly suspicious mass within the right breast at the 9:30 o'clock upper outer quadrant, measuring 1.8 x 1.1 x 1.5 cm, and located 18 cm from the nipple,  Ultrasonography revealed a single morphologically abnormal lymph node in the RIGHT axilla, with cortical thickness of 6 mm.   Accordingly on 01/11/2018 she proceeded to biopsy of the right breast area in question as well as a suspicious lymph node. The pathology from this procedure showed ([id number]): Invasive ductal carcinoma grade III.  The lymph node biopsied was negative for carcinoma (concordant).. Prognostic indicators significant for: estrogen receptor, 60% positive with weak staining intensity and progesterone receptor, 10% positive with strong staining intensity. Proliferation marker Ki67 at 30%. HER2 amplified with ratios HER2/CEP17 signals 2.26 and average HER2 copies per cell 3.50  The patient's subsequent history is as detailed below.   PAST MEDICAL HISTORY: Past Medical History:  Diagnosis Date  . ANEMIA-IRON DEFICIENCY 01/30/2010  . ANXIETY 11/27/2007  . Arthritis   . Asthma 02/25/2011  . Cancer ([place])    breast  . Carbuncle and furuncle of trunk 04/16/2010  . Chlamydia infection 03/21/2008  . DIABETES MELLITUS, TYPE II 08/02/2007  . Edema 01/30/2010  . ELEVATED BLOOD PRESSURE WITHOUT DIAGNOSIS OF HYPERTENSION 08/02/2007  . Family history of breast cancer   . Family history of colon cancer   . Family history of lung cancer   . FREQUENCY, URINARY 05/19/2009  . GENITAL HERPES 03/12/2009  . GERD (gastroesophageal reflux disease)   . History of kidney stones   . History of radiation therapy 07/27/18- 09/06/18   Right Breast, Right Axillary and Supraclavicular nodes 50 Gy in 25 fractions, Right Breast Boost  10 Gy in 5 fractions  . HIV (human immunodeficiency virus infection) ([place]) 2009  . HIV INFECTION 03/12/2009  . HSV (herpes simplex virus) infection   . HYPERLIPIDEMIA 08/02/2007  . Hypertension   .  Metrorrhagia 03/21/2008  . PONV (postoperative nausea and vomiting)    woke up crying per patient   . RETENTION, URINE 05/19/2009  . Sleep apnea   . Trichomonas infection 01/19/2010  . VITAMIN D DEFICIENCY 01/30/2010    PAST SURGICAL HISTORY: Past Surgical History:  Procedure Laterality Date  . ABDOMINAL HYSTERECTOMY  07/22/2010   TAH WITH PRESERVATION OF BOTH TUBES AND OVARIES  . BREAST LUMPECTOMY Right 2019  . BREAST LUMPECTOMY WITH RADIOACTIVE SEED AND SENTINEL LYMPH NODE BIOPSY Right 02/23/2018   Procedure: BREAST LUMPECTOMY WITH RADIOACTIVE SEED AND SENTINEL LYMPH NODE BIOPSY;  Surgeon: Stark Klein, MD;  Location: [facility];  Service: General;  Laterality: Right;  . CHOLECYSTECTOMY    . ENDOMETRIAL ABLATION  01/11/2008   HER OPTION  . ESOPHAGOGASTRODUODENOSCOPY    . MULTIPLE TOOTH EXTRACTIONS    . PORT-A-CATH REMOVAL Left 05/08/2019   Procedure: REMOVAL PORT-A-CATH;  Surgeon: Stark Klein, MD;  Location: [facility];  Service: General;  Laterality: Left;  . PORTACATH PLACEMENT Left 02/23/2018   Procedure: INSERTION PORT-A-CATH;  Surgeon: Stark Klein, MD;  Location: [facility];  Service: General;  Laterality: Left;  . PORTACATH PLACEMENT N/A 04/21/2018   Procedure: PORT-A-CATH REVISION;  Surgeon: Stark Klein, MD;  Location: [facility];  Service: General;  Laterality: N/A;  . TUBAL LIGATION    . WISDOM TOOTH EXTRACTION      FAMILY HISTORY: Family History  Problem Relation Age of Onset  . Prostate cancer Other   . Heart disease Other   . Stroke Other   . Diabetes Mother   . Hypertension Mother   . Diabetes Father   . Cancer Father        COLON and LU NG  . Colon cancer Father   . Stroke Paternal Uncle   . Breast cancer Paternal Aunt   . Lung cancer Maternal Grandmother 70       Mesothelioma  . Colon cancer Paternal Grandfather        dx over 63s  . Lung cancer Paternal Aunt   . Breast cancer Paternal Aunt   . Breast cancer Paternal Aunt   . Heart attack Maternal  Grandfather 71  . Colon cancer Other        MGM's 5 brothers   The patient's father died at age 65 due to metastatic liver cancer. The patient's mother is alive at 3. The patient's has 1 brother and 3 sisters. There was a paternal grandfather with colon cancer. There were 3 paternal aunts with breast cancer, 2 diagnosed in the 29's and 1 at age 76. There was a maternal grandmother with mesothelioma. The patient otherwise denies a history of ovarian cancer in the family.    GYNECOLOGIC HISTORY:  Patient's last menstrual period was 06/21/2010. Menarche: 50 years old Age at first live birth: 50 years old She is GXP5. Her LMP was December 2011. She is status post partial hysterectomy without oophorectomy. She took oral contraception for 3 years with no complications. She never took HRT.    SOCIAL HISTORY: (Updated January 2022). 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  She is currently applying for disability.  At home are 2 of her sons, Kelly Rowe and Kelly Rowe, and the patient's grandson, Kelly Rowe  (who is Willie's  son). The patient's oldest is Kelly Rowe who works in [employer] as a Training and development officer. Daughter, Kelly Rowe  works as a Scientist, water quality. Son, Kelly Rowe is disabled. Son, Kelly Rowe works in [place] as a Microbiologist. Daughter, Kelly Rowe also is a Microbiologist. The patient has 5 grandchildren and no great grandchildren. The patient does not belong to a church.    ADVANCED DIRECTIVES: Not in place; at the 01/18/2018 visit the patient was given the appropriate documents to complete on notarized at her discretion   HEALTH MAINTENANCE: Social History   Tobacco Use  . Smoking status: Never Smoker  . Smokeless tobacco: Never Used  Vaping Use  . Vaping Use: Never used  Substance Use Topics  . Alcohol use: Yes    Alcohol/week: 0.0 standard drinks    Comment: occ  . Drug use: No     Colonoscopy: Not yet  PAP: November 2018  Bone density: Never   Allergies  Allergen Reactions  . Metformin And Related Other (See  Comments)    Bowel frequency  . Levaquin [Levofloxacin In D5w] Nausea And Vomiting and Rash  . Penicillins Swelling and Rash    Has patient had a PCN reaction causing immediate rash, facial/tongue/throat swelling, SOB or lightheadedness with hypotension: Yes Has patient had a PCN reaction causing severe rash involving mucus membranes or skin necrosis: No Has patient had a PCN reaction that required hospitalization: Yes Has patient had a PCN reaction occurring within the last 10 years: No If all of the above answers are "NO", then may proceed with Cephalosporin use.    Current Outpatient Medications  Medication Sig Dispense Refill  . Doxepin HCl 3 MG TABS Take 1 tablet (3 mg total) by mouth at bedtime. 30 tablet 3  . albuterol (VENTOLIN HFA) 108 (90 Base) MCG/ACT inhaler Inhale 2 puffs into the lungs every 6 (six) hours as needed for wheezing or shortness of breath. 1 g 11  . bictegravir-emtricitabine-tenofovir AF (BIKTARVY) 50-200-25 MG TABS tablet Take 1 tablet by mouth daily.    . blood glucose meter kit and supplies Dispense based on patient and insurance preference. Use up to four times daily as directed. (FOR ICD-10 E10.9, E11.9). 1 each 0  . carvedilol (COREG) 6.25 MG tablet Take 1 tablet (6.25 mg total) by mouth 2 (two) times daily. 60 tablet 11  . colchicine 0.6 MG tablet Take 1 tablet (0.6 mg total) by mouth daily. 90 tablet 3  . Diclofenac Sodium (PENNSAID) 2 % SOLN Place 1 application onto the skin 2 (two) times daily. 112 g 2  . Dulaglutide (TRULICITY) 3 FV/4.9SW SOPN Inject 3 mg into the skin once a week. 6 mL 3  . ferrous gluconate (FERGON) 240 (27 FE) MG tablet Take 240 mg by mouth daily as needed (iron).     . gabapentin (NEURONTIN) 300 MG capsule Take 1 capsule (300 mg total) by mouth at bedtime. 90 capsule 4  . glipiZIDE (GLUCOTROL XL) 10 MG 24 hr tablet Take 1 tablet (10 mg total) by mouth daily with breakfast. 90 tablet 3  . glucose blood (ONETOUCH VERIO) test strip USE 1  STRIP TO CHECK GLUCOSE 4 TIMES DAILY AS DIRECTED. DX: E11.69 100 each 5  . hydrocortisone (ANUSOL-HC) 2.5 % rectal cream Place 1 application rectally 2 (two) times daily. 30 g 1  . ibuprofen (ADVIL,MOTRIN) 800 MG tablet Take 1 tablet (800 mg total) by mouth every 8 (eight) hours as needed. 60 tablet 0  . Insulin Glargine (BASAGLAR KWIKPEN) 100 UNIT/ML Inject 10 Units into the skin  daily. 15 mL 11  . Insulin Pen Needle (PEN NEEDLES) 32G X 4 MM MISC Use with insulin pen 100 each 12  . Lancets (ONETOUCH DELICA PLUS [id number]) MISC USE TO TEST BLOOD SUGAR UP TO 4 TIMES DAILY 100 each 5  . loratadine (CLARITIN) 10 MG tablet Take 10 mg by mouth daily.    Marland Kitchen losartan (COZAAR) 25 MG tablet Take 1 tablet (25 mg total) by mouth daily. 30 tablet 11  . Multiple Vitamin (MULTIVITAMIN WITH MINERALS) TABS tablet Take 1 tablet by mouth daily.    . nitroGLYCERIN (NITRODUR - DOSED IN MG/24 HR) 0.2 mg/hr patch Apply 1/4 patch daily to tendon for tendonitis. 30 patch 1  . nystatin-triamcinolone ointment (MYCOLOG) Apply 1 application topically 2 (two) times daily. 30 g 1  . pioglitazone (ACTOS) 45 MG tablet Take 1 tablet (45 mg total) by mouth daily. 90 tablet 3  . rosuvastatin (CRESTOR) 10 MG tablet Take 1 tablet (10 mg total) by mouth daily. 90 tablet 3  . tamoxifen (NOLVADEX) 20 MG tablet Take 1 tablet (20 mg total) by mouth daily. 90 tablet 4  . valACYclovir (VALTREX) 500 MG tablet Take twice daily for 3-5 days 30 tablet 12  . venlafaxine XR (EFFEXOR-XR) 75 MG 24 hr capsule Take 1 capsule (75 mg total) by mouth daily with breakfast. 90 capsule 4  . zolpidem (AMBIEN) 10 MG tablet Take 1 tablet (10 mg total) by mouth at bedtime as needed. for sleep 90 tablet 1   No current facility-administered medications for this visit.    OBJECTIVE: African-American woman who appears stated age  18:   08/19/20 1524  BP: 112/83  Pulse: 90  Resp: 18  Temp: (!) 97.1 F (36.2 C)  SpO2: 100%     Body mass index is 49.09  kg/m.   Wt Readings from Last 3 Encounters:  08/19/20 268 lb 6.4 oz (121.7 kg)  07/16/20 261 lb (118.4 kg)  06/18/20 269 lb 6.4 oz (122.2 kg)  ECOG FS: 2 - Symptomatic, <50% confined to bed  Sclerae unicteric, EOMs intact Wearing a mask No cervical or supraclavicular adenopathy Lungs no rales or rhonchi Heart regular rate and rhythm Abd soft, obese nontender, positive bowel sounds MSK no focal spinal tenderness, no upper extremity lymphedema Neuro: nonfocal, well oriented, appropriate affect Breasts: The right breast is status post lumpectomy and radiation.  There is no evidence of local recurrence per the left breast and both axillae are benign   LAB RESULTS:  CMP     Component Value Date/Time   NA 139 08/19/2020 1505   K 3.8 08/19/2020 1505   CL 103 08/19/2020 1505   CO2 28 08/19/2020 1505   GLUCOSE 125 (H) 08/19/2020 1505   BUN 12 08/19/2020 1505   CREATININE 0.85 08/19/2020 1505   CREATININE 1.04 (H) 06/18/2020 1202   CALCIUM 8.8 (L) 08/19/2020 1505   PROT 6.6 08/19/2020 1505   ALBUMIN 3.3 (L) 08/19/2020 1505   AST 18 08/19/2020 1505   AST 29 06/18/2020 1202   ALT 13 08/19/2020 1505   ALT 26 06/18/2020 1202   ALKPHOS 100 08/19/2020 1505   BILITOT 0.3 08/19/2020 1505   BILITOT 0.5 06/18/2020 1202   GFRNONAA >60 08/19/2020 1505   GFRNONAA >60 06/18/2020 1202   GFRAA >60 04/16/2020 1119   GFRAA >60 10/11/2019 1054    No results found for: TOTALPROTELP, ALBUMINELP, A1GS, A2GS, BETS, BETA2SER, GAMS, MSPIKE, SPEI  No results found for: KPAFRELGTCHN, LAMBDASER, KAPLAMBRATIO  Lab Results  Component  Value Date   WBC 3.2 (L) 08/19/2020   NEUTROABS 1.3 (L) 08/19/2020   HGB 12.1 08/19/2020   HCT 38.8 08/19/2020   MCV 88.2 08/19/2020   PLT 190 08/19/2020   No results found for: LABCA2  No components found for: [id number]  No results for input(s): INR in the last 168 hours.  No results found for: LABCA2  No results found for: KGS811  No results found for:  SRP594  No results found for: VOP929  No results found for: CA2729  No components found for: HGQUANT  No results found for: CEA1 / No results found for: CEA1   No results found for: AFPTUMOR  No results found for: CHROMOGRNA  No results found for: HGBA, HGBA2QUANT, HGBFQUANT, HGBSQUAN (Hemoglobinopathy evaluation)   No results found for: LDH  Lab Results  Component Value Date   IRON 75 10/03/2019   IRONPCTSAT 20.9 10/03/2019   (Iron and TIBC)  No results found for: FERRITIN  Urinalysis    Component Value Date/Time   COLORURINE STRAW (A) 06/18/2020 1105   APPEARANCEUR CLEAR 06/18/2020 1105   LABSPEC 1.029 06/18/2020 1105   PHURINE 5.0 06/18/2020 1105   GLUCOSEU >=500 (A) 06/18/2020 1105   GLUCOSEU NEGATIVE 10/03/2019 1426   HGBUR NEGATIVE 06/18/2020 1105   BILIRUBINUR NEGATIVE 06/18/2020 1105   KETONESUR 80 (A) 06/18/2020 1105   PROTEINUR NEGATIVE 06/18/2020 1105   UROBILINOGEN 0.2 10/03/2019 1426   NITRITE NEGATIVE 06/18/2020 [place] 06/18/2020 1105    STUDIES: No results found.   ELIGIBLE FOR AVAILABLE RESEARCH PROTOCOL: no   ASSESSMENT: 50 y.o. [place], [name] woman status post right breast upper outer quadrant biopsy 01/11/2018 for a clinical T1 N0, stage IA invasive ductal carcinoma, grade 3, estrogen and progesterone receptor positive, HER-2 amplified, with an MIB-1 of 30%.  (1) status post right lumpectomy and sentinel lymph node sampling 02/23/2018 for a pT2 pN1, stage IB invasive ductal carcinoma, grade 3, with close but negative margins  (2) adjuvant chemotherapy consisting of carboplatin, docetaxel, trastuzumab and Pertuzumab every 21 days x 6 given between 03/08/2018-07/05/2018 (a) pertuzumab stopped after cycle 1 due to diarrhea (b) docetaxel changed to gemcitabine after cycle 2 due to persistent hyperglycemia (c) Gemcitabine and Carboplatin dose reduced by approximately 10% due to delayed neutropenia and  thrombocytopenia  (3) anti-HER-2 immunotherapy started concurrently with chemotherapy (a) baseline echocardiogram on 02/07/2018 showed an ejection fraction in the 55-60% range (b) repeat echo 06/19/2018 showed an ejection fraction of 55-60% (c) repeat echocardiogram 11/14/2018 showed an EF in the 50-55% range, with normal strain. (d) echo 01/31/2019 shows an ejection fraction in the 55/60% range (e) final trastuzumab dose 03/12/2019  (4) adjuvant radiation 07/27/2018 - 09/06/2018  Site/dose: 1. Right Breast / 50 Gy in 25 fractions    2. Right Supraclavicular nodes / 50 Gy in 25 fractions    3. Right Breast Boost / 10 Gy in 5 fractions  (5) tamoxifen started November 2020  (6) genetics testing through [facility] Multi-cancer and Breast panel on 01/13/2018 showed: no deleterious mutations. The following genes were evaluated for sequence changes and exonic deletions/duplications: ALK, APC, ATM, AXIN2, BAP1, BARD1, BLM, BMPR1A, BRCA1, BRCA2, BRIP1, CASR, CDC73, CDH1, CDK4, CDKN1B, CDKN1C,CDKN2A (p14ARF), CDKN2A (p16INK4a), CEBPA, CHEK2, CTNNA1, DICER1, DIS3L2, EPCAM*, FH, FLCN, GATA2, GPC3, GREM1*, HRAS, KIT, MAX, MEN1, MET, MLH1, MSH2, MSH3, MSH6, MUTYH, NBN, NF1, NF2, PALB2, PDGFRA, PHOX2B*, PMS2, POLD1,Rowe, POT1, PRKAR1A, PTCH1, PTEN, RAD50, RAD51C, RAD51D, RB1, RECQL4, RET, RUNX1, SDHAF2, SDHB, SDHC, SDHD,SMAD4, SMARCA4, SMARCB1, SMARCE1,  STK11, SUFU, TERC, TERT, TMEM127, TP53, TSC1, TSC2, VHL, WRN*, WT1.The following genes were evaluated for sequence changes only: EGFR*, HOXB13*, MITF*, NTHL1*, SDHA   PLAN: Anvika is now 2-1/2 years out from definitive surgery for her breast cancer with no evidence of disease recurrence.  This is very favorable.  She is tolerating tamoxifen generally well and the plan is to continue that for a total of 5 years.  She is receiving physical therapy for lymphedema.  She does have a sleeve that she says fits well.  I have encouraged her to use it pretty much  24/7.  She is currently applying for disability because of the difficulty of using her right arm given the lymphedema from the earlier surgery and its sequela  For her insomnia I wrote a low dose of doxepin.  She is already on a very low dose of venlafaxine so I do not anticipate any significant interaction between these 2 agents.  Her next mammogram is due in early December and she will see [place] shortly after that.  She knows to call for any other issue that may develop before then.  Total encounter time 35 minutes.Sarajane Jews C. Yashar Inclan, MD 08/19/20 5:26 PM Medical Oncology and Hematology [facility] [place], [place] [address] Tel. [phone]    Fax. [phone]   I, Wilburn Mylar, am acting as scribe for Dr. [name]. Ilian Wessell.  I, Lurline Del MD, have reviewed the above documentation for accuracy and completeness, and I agree with the above.  *Total Encounter Time as defined by the Centers for Medicare and Medicaid Services includes, in addition to the face-to-face time of a patient visit (documented in the note above) non-face-to-face time: obtaining and reviewing outside history, ordering and reviewing medications, tests or procedures, care coordination (communications with other health care professionals or caregivers) and documentation in the medical record.

## 2020-08-19 ENCOUNTER — Inpatient Hospital Stay: Payer: BC Managed Care – PPO | Attending: Adult Health | Admitting: Oncology

## 2020-08-19 ENCOUNTER — Other Ambulatory Visit: Payer: Self-pay

## 2020-08-19 ENCOUNTER — Inpatient Hospital Stay: Payer: BC Managed Care – PPO

## 2020-08-19 VITALS — BP 112/83 | HR 90 | Temp 97.1°F | Resp 18 | Ht 62.0 in | Wt 268.4 lb

## 2020-08-19 DIAGNOSIS — Z801 Family history of malignant neoplasm of trachea, bronchus and lung: Secondary | ICD-10-CM | POA: Insufficient documentation

## 2020-08-19 DIAGNOSIS — G4700 Insomnia, unspecified: Secondary | ICD-10-CM | POA: Insufficient documentation

## 2020-08-19 DIAGNOSIS — F5101 Primary insomnia: Secondary | ICD-10-CM

## 2020-08-19 DIAGNOSIS — Z800 Family history of malignant neoplasm of digestive organs: Secondary | ICD-10-CM | POA: Diagnosis not present

## 2020-08-19 DIAGNOSIS — Z79810 Long term (current) use of selective estrogen receptor modulators (SERMs): Secondary | ICD-10-CM | POA: Insufficient documentation

## 2020-08-19 DIAGNOSIS — Z90710 Acquired absence of both cervix and uterus: Secondary | ICD-10-CM | POA: Insufficient documentation

## 2020-08-19 DIAGNOSIS — Z79899 Other long term (current) drug therapy: Secondary | ICD-10-CM | POA: Insufficient documentation

## 2020-08-19 DIAGNOSIS — Z8042 Family history of malignant neoplasm of prostate: Secondary | ICD-10-CM | POA: Insufficient documentation

## 2020-08-19 DIAGNOSIS — C50411 Malignant neoplasm of upper-outer quadrant of right female breast: Secondary | ICD-10-CM | POA: Diagnosis not present

## 2020-08-19 DIAGNOSIS — E1169 Type 2 diabetes mellitus with other specified complication: Secondary | ICD-10-CM

## 2020-08-19 DIAGNOSIS — Z170 Estrogen receptor positive status [ER+]: Secondary | ICD-10-CM | POA: Diagnosis present

## 2020-08-19 DIAGNOSIS — I890 Lymphedema, not elsewhere classified: Secondary | ICD-10-CM | POA: Insufficient documentation

## 2020-08-19 DIAGNOSIS — D508 Other iron deficiency anemias: Secondary | ICD-10-CM | POA: Diagnosis not present

## 2020-08-19 DIAGNOSIS — Z923 Personal history of irradiation: Secondary | ICD-10-CM | POA: Diagnosis not present

## 2020-08-19 DIAGNOSIS — C773 Secondary and unspecified malignant neoplasm of axilla and upper limb lymph nodes: Secondary | ICD-10-CM

## 2020-08-19 DIAGNOSIS — Z803 Family history of malignant neoplasm of breast: Secondary | ICD-10-CM | POA: Insufficient documentation

## 2020-08-19 DIAGNOSIS — Z90722 Acquired absence of ovaries, bilateral: Secondary | ICD-10-CM | POA: Insufficient documentation

## 2020-08-19 DIAGNOSIS — D708 Other neutropenia: Secondary | ICD-10-CM

## 2020-08-19 DIAGNOSIS — C50911 Malignant neoplasm of unspecified site of right female breast: Secondary | ICD-10-CM

## 2020-08-19 DIAGNOSIS — D72819 Decreased white blood cell count, unspecified: Secondary | ICD-10-CM | POA: Insufficient documentation

## 2020-08-19 LAB — CBC WITH DIFFERENTIAL/PLATELET
Abs Immature Granulocytes: 0.02 10*3/uL (ref 0.00–0.07)
Basophils Absolute: 0 10*3/uL (ref 0.0–0.1)
Basophils Relative: 1 %
Eosinophils Absolute: 0 10*3/uL (ref 0.0–0.5)
Eosinophils Relative: 1 %
HCT: 38.8 % (ref 36.0–46.0)
Hemoglobin: 12.1 g/dL (ref 12.0–15.0)
Immature Granulocytes: 1 %
Lymphocytes Relative: 47 %
Lymphs Abs: 1.6 10*3/uL (ref 0.7–4.0)
MCH: 27.5 pg (ref 26.0–34.0)
MCHC: 31.2 g/dL (ref 30.0–36.0)
MCV: 88.2 fL (ref 80.0–100.0)
Monocytes Absolute: 0.3 10*3/uL (ref 0.1–1.0)
Monocytes Relative: 8 %
Neutro Abs: 1.3 10*3/uL — ABNORMAL LOW (ref 1.7–7.7)
Neutrophils Relative %: 42 %
Platelets: 190 10*3/uL (ref 150–400)
RBC: 4.4 MIL/uL (ref 3.87–5.11)
RDW: 14.4 % (ref 11.5–15.5)
WBC: 3.2 10*3/uL — ABNORMAL LOW (ref 4.0–10.5)
nRBC: 0 % (ref 0.0–0.2)

## 2020-08-19 LAB — COMPREHENSIVE METABOLIC PANEL
ALT: 13 U/L (ref 0–44)
AST: 18 U/L (ref 15–41)
Albumin: 3.3 g/dL — ABNORMAL LOW (ref 3.5–5.0)
Alkaline Phosphatase: 100 U/L (ref 38–126)
Anion gap: 8 (ref 5–15)
BUN: 12 mg/dL (ref 6–20)
CO2: 28 mmol/L (ref 22–32)
Calcium: 8.8 mg/dL — ABNORMAL LOW (ref 8.9–10.3)
Chloride: 103 mmol/L (ref 98–111)
Creatinine, Ser: 0.85 mg/dL (ref 0.44–1.00)
GFR, Estimated: >60 mL/min (ref >60)
Glucose, Bld: 125 mg/dL — ABNORMAL HIGH (ref 70–99)
Potassium: 3.8 mmol/L (ref 3.5–5.1)
Sodium: 139 mmol/L (ref 135–145)
Total Bilirubin: 0.3 mg/dL (ref 0.3–1.2)
Total Protein: 6.6 g/dL (ref 6.5–8.1)

## 2020-08-19 MED ORDER — DOXEPIN HCL 3 MG PO TABS: 3.0000 mg | ORAL_TABLET | Freq: Every day | ORAL | 3 refills | Status: AC

## 2020-08-20 ENCOUNTER — Encounter: Payer: BC Managed Care – PPO | Admitting: Physical Therapy

## 2020-08-21 ENCOUNTER — Ambulatory Visit: Payer: BC Managed Care – PPO | Admitting: Physical Therapy

## 2020-08-21 ENCOUNTER — Encounter: Payer: Self-pay | Admitting: Physical Therapy

## 2020-08-21 ENCOUNTER — Other Ambulatory Visit: Payer: Self-pay

## 2020-08-21 DIAGNOSIS — I890 Lymphedema, not elsewhere classified: Secondary | ICD-10-CM | POA: Diagnosis not present

## 2020-08-21 DIAGNOSIS — G8929 Other chronic pain: Secondary | ICD-10-CM

## 2020-08-21 DIAGNOSIS — M25611 Stiffness of right shoulder, not elsewhere classified: Secondary | ICD-10-CM

## 2020-08-21 NOTE — Therapy (Signed)
[place], [name], [address] Phone: [phone]   Fax:  [phone]  Physical Therapy Treatment  Patient Details  Name: Kelly Rowe MRN: [id number] Date of Birth: Aug 28, 1970 Referring Provider (PT): Dr. Barry Dienes    Encounter Date: 08/21/2020   PT End of Session - 08/21/20 1423    Visit Number 18    Number of Visits 23    Date for PT Re-Evaluation 09/03/20    PT Start Time 1318   pt arrived late   PT Stop Time 1415    PT Time Calculation (min) 57 min    Activity Tolerance Patient tolerated treatment well    Behavior During Therapy [facility] for tasks assessed/performed           Past Medical History:  Diagnosis Date  . ANEMIA-IRON DEFICIENCY 01/30/2010  . ANXIETY 11/27/2007  . Arthritis   . Asthma 02/25/2011  . Cancer ([place])    breast  . Carbuncle and furuncle of trunk 04/16/2010  . Chlamydia infection 03/21/2008  . DIABETES MELLITUS, TYPE II 08/02/2007  . Edema 01/30/2010  . ELEVATED BLOOD PRESSURE WITHOUT DIAGNOSIS OF HYPERTENSION 08/02/2007  . Family history of breast cancer   . Family history of colon cancer   . Family history of lung cancer   . FREQUENCY, URINARY 05/19/2009  . GENITAL HERPES 03/12/2009  . GERD (gastroesophageal reflux disease)   . History of kidney stones   . History of radiation therapy 07/27/18- 09/06/18   Right Breast, Right Axillary and Supraclavicular nodes 50 Gy in 25 fractions, Right Breast Boost 10 Gy in 5 fractions  . HIV (human immunodeficiency virus infection) ([place]) 2009  . HIV INFECTION 03/12/2009  . HSV (herpes simplex virus) infection   . HYPERLIPIDEMIA 08/02/2007  . Hypertension   . Metrorrhagia 03/21/2008  . PONV (postoperative nausea and vomiting)    woke up crying per patient   . RETENTION, URINE 05/19/2009  . Sleep apnea   . Trichomonas infection 01/19/2010  . VITAMIN D DEFICIENCY 01/30/2010    Past Surgical History:  Procedure Laterality Date  . ABDOMINAL  HYSTERECTOMY  07/22/2010   TAH WITH PRESERVATION OF BOTH TUBES AND OVARIES  . BREAST LUMPECTOMY Right 2019  . BREAST LUMPECTOMY WITH RADIOACTIVE SEED AND SENTINEL LYMPH NODE BIOPSY Right 02/23/2018   Procedure: BREAST LUMPECTOMY WITH RADIOACTIVE SEED AND SENTINEL LYMPH NODE BIOPSY;  Surgeon: Stark Klein, MD;  Location: [place];  Service: General;  Laterality: Right;  . CHOLECYSTECTOMY    . ENDOMETRIAL ABLATION  01/11/2008   HER OPTION  . ESOPHAGOGASTRODUODENOSCOPY    . MULTIPLE TOOTH EXTRACTIONS    . PORT-A-CATH REMOVAL Left 05/08/2019   Procedure: REMOVAL PORT-A-CATH;  Surgeon: Stark Klein, MD;  Location: [place];  Service: General;  Laterality: Left;  . PORTACATH PLACEMENT Left 02/23/2018   Procedure: INSERTION PORT-A-CATH;  Surgeon: Stark Klein, MD;  Location: [place];  Service: General;  Laterality: Left;  . PORTACATH PLACEMENT N/A 04/21/2018   Procedure: PORT-A-CATH REVISION;  Surgeon: Stark Klein, MD;  [facility];  Service: General;  Laterality: N/A;  . TUBAL LIGATION    . WISDOM TOOTH EXTRACTION      There were no vitals filed for this visit.   Subjective Assessment - 08/21/20 1319    Subjective The shoulder was ok and me and my friend were playing and she pulled my arm and pain went through it. It is doing better now. I got the pad for my bra but  it is hard to get on.    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 Chemotherapy completed 07/05/18, Herceptin started with chemo and continues through August of this year.  Radiation 07/27/18-09/06/18 to the Rt breast, Antiestrogens to follow. Other medical history to include asthma, DM, HTN, HIV, and hysterectomy    Patient Stated Goals to get swelling down and be able to reach- wants to be able to wash herself in the shower    Currently in Pain? Yes    Pain Score 2     Pain Location Shoulder    Pain Orientation Right    Pain  Descriptors / Indicators Aching    Pain Type Acute pain    Pain Onset In the past 7 days                     Flowsheet Row Outpatient Rehab from 05/14/2020 in [place]  Lymphedema Life Impact Scale Total Score 89.71 %            [facility] Adult PT Treatment/Exercise - 08/21/20 0001      Iontophoresis   Type of Iontophoresis Dexamethasone    Location insertion of rotator cuff tendons on R    Dose 4 mg/ml    Time 4-6 hr      Manual Therapy   Manual Therapy Joint mobilization;Manual Lymphatic Drainage (MLD);Soft tissue mobilization;Passive ROM    Joint Mobilization in supine gentle inferior GH glides with arm at 90 degrees of abduction    Soft tissue mobilization to insertion of rotator cuff tendons at right upper arm and along deltoid    Manual Lymphatic Drainage (MLD) short neck, 5 diaphragmatic breaths, superficial and deep abdominals, right inguinal nodes and establishment of axillo inguinal pathway, sternal nodes, and then R breast moving fluid towards pathways then retracing all steps. Focus on medial inferior breast with hardness/fibrosis evident under enlarged pores    Passive ROM to R shoulder in direction of flexion, abduction and ER with prolonged holds for stretching                       PT Long Term Goals - 08/06/20 1411      PT LONG TERM GOAL #1   Title Pt will demonstrate 140 degrees of R shoulder abduction to allow her to reach out to the side    Baseline 91; 06/11/20- 147    Time 4    Period Weeks    Status Achieved      PT LONG TERM GOAL #2   Title Pt will demonstrate 150 degrees of right shoulder flexion to allow her to reach items on the shelf    Baseline 127; 06/11/20- 127, 07/03/20: 162    Time 4    Period Weeks    Status Achieved      PT LONG TERM GOAL #3   Title Pt will demonstrate 40 degrees of R shoulder IR to allow her to reach back to fasten her bra and perform hygeine    Baseline 19;  06/11/20- 34; 07/09/20- 37    Time 4    Period Weeks    Status On-going      PT LONG TERM GOAL #4   Title Pt will be able to open a jar or a can without assistance from her son to decrease dependence on caregivers.    Baseline 07/09/20- pt reports  she is able to open a jar    Time 4    Period Weeks    Status Achieved      PT LONG TERM GOAL #5   Title Pt will receive a compression bra, sleeve and glove for long term management of lymphedema.    Baseline 06/11/20- pt signed up to be measured 12/7; 07/09/20- pt is awaiting arrival of compression bra; 08/06/20- pt received compression bra bu it does not fit correctly, DME supplier is ordering a pad to go in the bra to reduce fibrosis and also have a better fit    Time 4    Period Weeks    Status On-going      Additional Long Term Goals   Additional Long Term Goals Yes      PT LONG TERM GOAL #6   Title Pt will be independent in a home exercise program for strengthening and stretching    Time 4    Period Weeks    Status On-going      PT LONG TERM GOAL #7   Title Pt will demosntrate a 50% improvement in fibrosis present throughout R breast to decrease risk of cellulitis    Baseline 07/09/20- 10% improvement; 08/06/20- fibrosis is still present, awaiting pad to go in bra to decrease fibrosis    Time 4    Period Weeks    Status On-going      PT LONG TERM GOAL #8   Title Pt will improve right grip strength (#2 setting) to atleast 34 lbs for improved ability to perform household chores    Baseline 16 lbs; 06/11/20- 30lbs, 07/03/20: 42    Time 5    Period Weeks    Status Achieved      PT LONG TERM GOAL  #9   TITLE Pt will report a 50% improvement in R shoulder pain to allow pt improved comfort.    Time 4    Period Weeks    Status New    Target Date 09/03/20                 Plan - 08/21/20 1424    Clinical Impression Statement Pt received her compression pad for her bra. Assessed it for fit and fit has improved and her R  breast now has appropriate compression. Her R shoulder is still very limited with ROM especially in to external rotation so performed PROM and joint mobilizations to help improve R shoulder ROM. She demonstrates poor glenohumeral rhythm. Continued with iontophoresis because pt reports this helps her pain. Ended session with MLD to right breast.    PT Frequency 2x / week    PT Duration 4 weeks    PT Treatment/Interventions ADLs/Self Care Home Management;Manual lymph drainage;Compression bandaging;Manual techniques;Taping;DME Instruction;Therapeutic exercise;Patient/family education;Passive range of motion;Scar mobilization;Therapeutic activities;Joint Manipulations;Iontophoresis 4mg /ml Dexamethasone    PT Next Visit Plan cont Ionto prn, continue MLD of the right breast, PROM. pulleys- eventually teach strength ABC    Consulted and Agree with Plan of Care Patient           Patient will benefit from skilled therapeutic intervention in order to improve the following deficits and impairments:  Increased edema,Impaired UE functional use,Postural dysfunction,Pain,Decreased knowledge of use of DME,Decreased knowledge of precautions,Decreased scar mobility,Decreased range of motion,Decreased strength,Increased fascial restricitons  Visit Diagnosis: Stiffness of right shoulder, not elsewhere classified  Chronic right shoulder pain  Lymphedema, not elsewhere classified     Problem List Patient Active Problem List   Diagnosis Date  Noted  . Leukopenia 08/19/2020  . Osteoarthritis of left knee 11/29/2019  . Lymphedema of right arm 10/11/2019  . Upper respiratory infection 07/02/2019  . Type 2 diabetes mellitus with other specified complication ([place]) [id number]  . Hematochezia 03/30/2018  . Insomnia 03/30/2018  . Port-A-Cath in place 03/29/2018  . Genetic testing 01/30/2018  . Neoplasm of breast, regional lymph node staging category N3b: metastasis in ipsilateral internal mammary lymph node and  axillary lymph node ([place]) 01/30/2018  . Family history of breast cancer   . Family history of colon cancer   . Family history of lung cancer   . Morbid obesity ([place]) 01/18/2018  . Malignant neoplasm of upper-outer quadrant of right breast in female, estrogen receptor positive ([place]) 01/17/2018  . Achilles tendinitis 11/28/2017  . Hot flashes 09/30/2017  . Hypertension 09/08/2017  . Contusion of left knee and lower leg 11/18/2015  . Mild intermittent asthma 08/15/2013  . BV (bacterial vaginosis) 08/11/2012  . Right knee pain 07/05/2012  . GERD (gastroesophageal reflux disease) 07/05/2012  . GC (gonococcus) 05/30/2012  . Chlamydia 05/30/2012  . Left knee pain 01/12/2012  . Encounter for long-term (current) use of high-risk medication 02/25/2011  . Preventative health care 02/14/2011  . VITAMIN D DEFICIENCY 01/30/2010  . ANEMIA-IRON DEFICIENCY 01/30/2010  . Edema 01/30/2010  . HIV (human immunodeficiency virus infection) ([place]) 03/12/2009  . GENITAL HERPES 03/12/2009  . Anxiety state 11/27/2007  . 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 Hyperlipidemia 08/02/2007    Allyson Sabal [facility] 08/21/2020, 2:26 PM  [place], [name], [address] Phone: [phone]   Fax:  [phone]  Name: Breunna Leino MRN: [id number] Date of Birth: 1971-01-28  Manus Gunning, PT 08/21/20 2:26 PM

## 2020-08-25 ENCOUNTER — Other Ambulatory Visit: Payer: Self-pay

## 2020-08-25 ENCOUNTER — Ambulatory Visit: Payer: BC Managed Care – PPO | Admitting: Physical Therapy

## 2020-08-25 ENCOUNTER — Encounter: Payer: Self-pay | Admitting: Physical Therapy

## 2020-08-25 DIAGNOSIS — G8929 Other chronic pain: Secondary | ICD-10-CM

## 2020-08-25 DIAGNOSIS — I890 Lymphedema, not elsewhere classified: Secondary | ICD-10-CM | POA: Diagnosis not present

## 2020-08-25 DIAGNOSIS — R293 Abnormal posture: Secondary | ICD-10-CM

## 2020-08-25 DIAGNOSIS — M25511 Pain in right shoulder: Secondary | ICD-10-CM

## 2020-08-25 DIAGNOSIS — M25611 Stiffness of right shoulder, not elsewhere classified: Secondary | ICD-10-CM

## 2020-08-25 NOTE — Patient Instructions (Signed)
Over Head Pull: Narrow and Wide Grip   Cancer Rehab [phone]   On back, knees bent, feet flat, band across thighs, elbows straight but relaxed. Pull hands apart (start). Keeping elbows straight, bring arms up and over head, hands toward floor. Keep pull steady on band. Hold momentarily. Return slowly, keeping pull steady, back to start. Then do same with a wider grip on the band (past shoulder width) Repeat _10__ times. Band color __green____   Side Pull: Double Arm   On back, knees bent, feet flat. Arms perpendicular to body, shoulder level, elbows straight but relaxed. Pull arms out to sides, elbows straight. Resistance band comes across collarbones, hands toward floor. Hold momentarily. Slowly return to starting position. Repeat _5-10__ times. Band color _yellow____   Sword   On back, knees bent, feet flat, left hand on left hip, right hand above left. Pull right arm DIAGONALLY (hip to shoulder) across chest. Bring right arm along head toward floor. Hold momentarily. Slowly return to starting position. Thumb is pointed down when by hip and rotates upwards when by head.  Repeat _10__ times. Do with left arm. Band color _green_____   Shoulder Rotation: Double Arm   On back, knees bent, feet flat, elbows tucked at sides, bent 90, hands palms up. Pull hands apart and down toward floor, keeping elbows near sides. Hold momentarily. Slowly return to starting position. Repeat _10__ times. Band color __green___

## 2020-08-25 NOTE — Therapy (Signed)
[place], [name], [address] Phone: [phone]   Fax:  [phone]  Physical Therapy Treatment  Patient Details  Name: Kelly Rowe MRN: [id number] Date of Birth: 1971/06/16 Referring Provider (PT): Dr. Barry Dienes    Encounter Date: 08/25/2020   PT End of Session - 08/25/20 1352    Visit Number 19    Number of Visits 23    Date for PT Re-Evaluation 09/03/20    PT Start Time 1308   pt arrived late   PT Stop Time 1356    PT Time Calculation (min) 48 min    Activity Tolerance Patient tolerated treatment well    Behavior During Therapy [facility] for tasks assessed/performed           Past Medical History:  Diagnosis Date  . ANEMIA-IRON DEFICIENCY 01/30/2010  . ANXIETY 11/27/2007  . Arthritis   . Asthma 02/25/2011  . Cancer ([place])    breast  . Carbuncle and furuncle of trunk 04/16/2010  . Chlamydia infection 03/21/2008  . DIABETES MELLITUS, TYPE II 08/02/2007  . Edema 01/30/2010  . ELEVATED BLOOD PRESSURE WITHOUT DIAGNOSIS OF HYPERTENSION 08/02/2007  . Family history of breast cancer   . Family history of colon cancer   . Family history of lung cancer   . FREQUENCY, URINARY 05/19/2009  . GENITAL HERPES 03/12/2009  . GERD (gastroesophageal reflux disease)   . History of kidney stones   . History of radiation therapy 07/27/18- 09/06/18   Right Breast, Right Axillary and Supraclavicular nodes 50 Gy in 25 fractions, Right Breast Boost 10 Gy in 5 fractions  . HIV (human immunodeficiency virus infection) ([place]) 2009  . HIV INFECTION 03/12/2009  . HSV (herpes simplex virus) infection   . HYPERLIPIDEMIA 08/02/2007  . Hypertension   . Metrorrhagia 03/21/2008  . PONV (postoperative nausea and vomiting)    woke up crying per patient   . RETENTION, URINE 05/19/2009  . Sleep apnea   . Trichomonas infection 01/19/2010  . VITAMIN D DEFICIENCY 01/30/2010    Past Surgical History:  Procedure Laterality Date  . ABDOMINAL  HYSTERECTOMY  07/22/2010   TAH WITH PRESERVATION OF BOTH TUBES AND OVARIES  . BREAST LUMPECTOMY Right 2019  . BREAST LUMPECTOMY WITH RADIOACTIVE SEED AND SENTINEL LYMPH NODE BIOPSY Right 02/23/2018   Procedure: BREAST LUMPECTOMY WITH RADIOACTIVE SEED AND SENTINEL LYMPH NODE BIOPSY;  Surgeon: Stark Klein, MD;  Location: [place];  Service: General;  Laterality: Right;  . CHOLECYSTECTOMY    . ENDOMETRIAL ABLATION  01/11/2008   HER OPTION  . ESOPHAGOGASTRODUODENOSCOPY    . MULTIPLE TOOTH EXTRACTIONS    . PORT-A-CATH REMOVAL Left 05/08/2019   Procedure: REMOVAL PORT-A-CATH;  Surgeon: Stark Klein, MD;  Location: [place];  Service: General;  Laterality: Left;  . PORTACATH PLACEMENT Left 02/23/2018   Procedure: INSERTION PORT-A-CATH;  Surgeon: Stark Klein, MD;  Location: [place];  Service: General;  Laterality: Left;  . PORTACATH PLACEMENT N/A 04/21/2018   Procedure: PORT-A-CATH REVISION;  Surgeon: Stark Klein, MD;  [facility];  Service: General;  Laterality: N/A;  . TUBAL LIGATION    . WISDOM TOOTH EXTRACTION      There were no vitals filed for this visit.   Subjective Assessment - 08/25/20 1309    Subjective Everything is good right now. I haven't done much of anything.    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 Chemotherapy completed 07/05/18, Herceptin started with chemo and continues through August of this year.  Radiation 07/27/18-09/06/18 to the Rt breast, Antiestrogens to follow. Other medical history to include asthma, DM, HTN, HIV, and hysterectomy    Patient Stated Goals to get swelling down and be able to reach- wants to be able to wash herself in the shower    Currently in Pain? No/denies    Pain Score 0-No pain                     Flowsheet Row Outpatient Rehab from 05/14/2020 in [place]  Lymphedema Life Impact Scale  Total Score 89.71 %            [facility] Adult PT Treatment/Exercise - 08/25/20 0001      Shoulder Exercises: Supine   Horizontal ABduction Strengthening;Both;10 reps;Theraband    Theraband Level (Shoulder Horizontal ABduction) Level 3 (Green)    Horizontal ABduction Limitations v/c for straight elbows and wide grip on band    External Rotation Strengthening;Both;10 reps;Theraband    Theraband Level (Shoulder External Rotation) Level 3 (Green)    Flexion Strengthening;Both;10 reps;Theraband   narrow and wide grip   Theraband Level (Shoulder Flexion) Level 3 (Green)    Flexion Limitations v/c to keep elbows straight    Diagonals Strengthening;Both;10 reps;Theraband    Theraband Level (Shoulder Diagonals) Level 3 (Green)      Manual Therapy   Manual Lymphatic Drainage (MLD) short neck, 5 diaphragmatic breaths, superficial and deep abdominals, right inguinal nodes and establishment of axillo inguinal pathway, sternal nodes, and then R breast and R UE, moving fluid towards pathways then retracing all steps. Focus on medial inferior breast with hardness/fibrosis evident under enlarged pores                       PT Long Term Goals - 08/06/20 1411      PT LONG TERM GOAL #1   Title Pt will demonstrate 140 degrees of R shoulder abduction to allow her to reach out to the side    Baseline 91; 06/11/20- 147    Time 4    Period Weeks    Status Achieved      PT LONG TERM GOAL #2   Title Pt will demonstrate 150 degrees of right shoulder flexion to allow her to reach items on the shelf    Baseline 127; 06/11/20- 127, 07/03/20: 162    Time 4    Period Weeks    Status Achieved      PT LONG TERM GOAL #3   Title Pt will demonstrate 40 degrees of R shoulder IR to allow her to reach back to fasten her bra and perform hygeine    Baseline 19; 06/11/20- 34; 07/09/20- 37    Time 4    Period Weeks    Status On-going      PT LONG TERM GOAL #4   Title Pt will be able to open a jar or a  can without assistance from her son to decrease dependence on caregivers.    Baseline 07/09/20- pt reports she is able to open a jar    Time 4    Period Weeks    Status Achieved      PT LONG TERM GOAL #5   Title Pt will receive a compression bra, sleeve and glove for long term management of lymphedema.    Baseline 06/11/20- pt signed up to be measured 12/7; 07/09/20-  pt is awaiting arrival of compression bra; 08/06/20- pt received compression bra bu it does not fit correctly, DME supplier is ordering a pad to go in the bra to reduce fibrosis and also have a better fit    Time 4    Period Weeks    Status On-going      Additional Long Term Goals   Additional Long Term Goals Yes      PT LONG TERM GOAL #6   Title Pt will be independent in a home exercise program for strengthening and stretching    Time 4    Period Weeks    Status On-going      PT LONG TERM GOAL #7   Title Pt will demosntrate a 50% improvement in fibrosis present throughout R breast to decrease risk of cellulitis    Baseline 07/09/20- 10% improvement; 08/06/20- fibrosis is still present, awaiting pad to go in bra to decrease fibrosis    Time 4    Period Weeks    Status On-going      PT LONG TERM GOAL #8   Title Pt will improve right grip strength (#2 setting) to atleast 34 lbs for improved ability to perform household chores    Baseline 16 lbs; 06/11/20- 30lbs, 07/03/20: 42    Time 5    Period Weeks    Status Achieved      PT LONG TERM GOAL  #9   TITLE Pt will report a 50% improvement in R shoulder pain to allow pt improved comfort.    Time 4    Period Weeks    Status New    Target Date 09/03/20                 Plan - 08/25/20 1353    Clinical Impression Statement Pt is not having any pain today. She reports she did not do anything over the weekend so her shoulder is not hurting today. Educated pt about the importance of exercise to help improve her shoulder strength so that when she is more active her  shoulders do not ache. Instructed pt in supine scapular strengthening exercises and issued green theraband. Had pt return demonstrate 10 reps of all exercises today. Ended session with MLD to R breast and UE.    PT Frequency 2x / week    PT Duration 4 weeks    PT Treatment/Interventions ADLs/Self Care Home Management;Manual lymph drainage;Compression bandaging;Manual techniques;Taping;DME Instruction;Therapeutic exercise;Patient/family education;Passive range of motion;Scar mobilization;Therapeutic activities;Joint Manipulations;Iontophoresis 4mg /ml Dexamethasone    PT Next Visit Plan assess indep with supine scap, cont Ionto prn, continue MLD of the right breast, PROM. pulleys- eventually teach strength ABC    PT Home Exercise Plan supine scap ex    Consulted and Agree with Plan of Care Patient           Patient will benefit from skilled therapeutic intervention in order to improve the following deficits and impairments:  Increased edema,Impaired UE functional use,Postural dysfunction,Pain,Decreased knowledge of use of DME,Decreased knowledge of precautions,Decreased scar mobility,Decreased range of motion,Decreased strength,Increased fascial restricitons  Visit Diagnosis: Stiffness of right shoulder, not elsewhere classified  Chronic right shoulder pain  Lymphedema, not elsewhere classified  Abnormal posture     Problem List Patient Active Problem List   Diagnosis Date Noted  . Leukopenia 08/19/2020  . Osteoarthritis of left knee 11/29/2019  . Lymphedema of right arm 10/11/2019  . Upper respiratory infection 07/02/2019  . Type 2 diabetes mellitus with other specified complication ([place]) [id number]  .  Hematochezia 03/30/2018  . Insomnia 03/30/2018  . Port-A-Cath in place 03/29/2018  . Genetic testing 01/30/2018  . Neoplasm of breast, regional lymph node staging category N3b: metastasis in ipsilateral internal mammary lymph node and axillary lymph node ([place]) 01/30/2018  . Family  history of breast cancer   . Family history of colon cancer   . Family history of lung cancer   . Morbid obesity ([place]) 01/18/2018  . Malignant neoplasm of upper-outer quadrant of right breast in female, estrogen receptor positive ([place]) 01/17/2018  . Achilles tendinitis 11/28/2017  . Hot flashes 09/30/2017  . Hypertension 09/08/2017  . Contusion of left knee and lower leg 11/18/2015  . Mild intermittent asthma 08/15/2013  . BV (bacterial vaginosis) 08/11/2012  . Right knee pain 07/05/2012  . GERD (gastroesophageal reflux disease) 07/05/2012  . GC (gonococcus) 05/30/2012  . Chlamydia 05/30/2012  . Left knee pain 01/12/2012  . Encounter for long-term (current) use of high-risk medication 02/25/2011  . Preventative health care 02/14/2011  . VITAMIN D DEFICIENCY 01/30/2010  . ANEMIA-IRON DEFICIENCY 01/30/2010  . Edema 01/30/2010  . HIV (human immunodeficiency virus infection) ([place]) 03/12/2009  . GENITAL HERPES 03/12/2009  . Anxiety state 11/27/2007  . 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 Hyperlipidemia 08/02/2007    Allyson Sabal [facility] 08/25/2020, 1:58 PM  [place], [name], [address] Phone: [phone]   Fax:  [phone]  Name: Kelly Rowe MRN: [id number] Date of Birth: Sep 05, 1970  Manus Gunning, PT 08/25/20 1:59 PM

## 2020-08-27 ENCOUNTER — Other Ambulatory Visit: Payer: Self-pay

## 2020-08-27 ENCOUNTER — Ambulatory Visit: Payer: BC Managed Care – PPO | Attending: General Surgery | Admitting: Physical Therapy

## 2020-08-27 ENCOUNTER — Encounter: Payer: Self-pay | Admitting: Physical Therapy

## 2020-08-27 DIAGNOSIS — M25511 Pain in right shoulder: Secondary | ICD-10-CM | POA: Insufficient documentation

## 2020-08-27 DIAGNOSIS — I890 Lymphedema, not elsewhere classified: Secondary | ICD-10-CM | POA: Insufficient documentation

## 2020-08-27 DIAGNOSIS — M25611 Stiffness of right shoulder, not elsewhere classified: Secondary | ICD-10-CM | POA: Diagnosis present

## 2020-08-27 DIAGNOSIS — G8929 Other chronic pain: Secondary | ICD-10-CM | POA: Diagnosis present

## 2020-08-27 NOTE — Therapy (Signed)
[place], [name], [address] Phone: [phone]   Fax:  [phone]  Physical Therapy Treatment  Patient Details  Name: Kelly Rowe MRN: [id number] Date of Birth: 07-Nov-1970 Referring Provider (PT): Dr. Barry Dienes    Encounter Date: 08/27/2020   PT End of Session - 08/27/20 1337    Visit Number 20    Number of Visits 23    Date for PT Re-Evaluation 09/03/20    PT Start Time 6962    PT Stop Time 1330   pt had to leave session early   PT Time Calculation (min) 25 min    Activity Tolerance Patient tolerated treatment well    Behavior During Therapy [facility] for tasks assessed/performed           Past Medical History:  Diagnosis Date  . ANEMIA-IRON DEFICIENCY 01/30/2010  . ANXIETY 11/27/2007  . Arthritis   . Asthma 02/25/2011  . Cancer ([place])    breast  . Carbuncle and furuncle of trunk 04/16/2010  . Chlamydia infection 03/21/2008  . DIABETES MELLITUS, TYPE II 08/02/2007  . Edema 01/30/2010  . ELEVATED BLOOD PRESSURE WITHOUT DIAGNOSIS OF HYPERTENSION 08/02/2007  . Family history of breast cancer   . Family history of colon cancer   . Family history of lung cancer   . FREQUENCY, URINARY 05/19/2009  . GENITAL HERPES 03/12/2009  . GERD (gastroesophageal reflux disease)   . History of kidney stones   . History of radiation therapy 07/27/18- 09/06/18   Right Breast, Right Axillary and Supraclavicular nodes 50 Gy in 25 fractions, Right Breast Boost 10 Gy in 5 fractions  . HIV (human immunodeficiency virus infection) ([place]) 2009  . HIV INFECTION 03/12/2009  . HSV (herpes simplex virus) infection   . HYPERLIPIDEMIA 08/02/2007  . Hypertension   . Metrorrhagia 03/21/2008  . PONV (postoperative nausea and vomiting)    woke up crying per patient   . RETENTION, URINE 05/19/2009  . Sleep apnea   . Trichomonas infection 01/19/2010  . VITAMIN D DEFICIENCY 01/30/2010    Past Surgical History:  Procedure Laterality Date   . ABDOMINAL HYSTERECTOMY  07/22/2010   TAH WITH PRESERVATION OF BOTH TUBES AND OVARIES  . BREAST LUMPECTOMY Right 2019  . BREAST LUMPECTOMY WITH RADIOACTIVE SEED AND SENTINEL LYMPH NODE BIOPSY Right 02/23/2018   Procedure: BREAST LUMPECTOMY WITH RADIOACTIVE SEED AND SENTINEL LYMPH NODE BIOPSY;  Surgeon: Stark Klein, MD;  Location: [place];  Service: General;  Laterality: Right;  . CHOLECYSTECTOMY    . ENDOMETRIAL ABLATION  01/11/2008   HER OPTION  . ESOPHAGOGASTRODUODENOSCOPY    . MULTIPLE TOOTH EXTRACTIONS    . PORT-A-CATH REMOVAL Left 05/08/2019   Procedure: REMOVAL PORT-A-CATH;  Surgeon: Stark Klein, MD;  Location: [place];  Service: General;  Laterality: Left;  . PORTACATH PLACEMENT Left 02/23/2018   Procedure: INSERTION PORT-A-CATH;  Surgeon: Stark Klein, MD;  Location: [place];  Service: General;  Laterality: Left;  . PORTACATH PLACEMENT N/A 04/21/2018   Procedure: PORT-A-CATH REVISION;  Surgeon: Stark Klein, MD;  [facility];  Service: General;  Laterality: N/A;  . TUBAL LIGATION    . WISDOM TOOTH EXTRACTION      There were no vitals filed for this visit.   Subjective Assessment - 08/27/20 1307    Subjective Everything is going good today.    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 Chemotherapy  completed 07/05/18, Herceptin started with chemo and continues through August of this year.  Radiation 07/27/18-09/06/18 to the Rt breast, Antiestrogens to follow. Other medical history to include asthma, DM, HTN, HIV, and hysterectomy    Patient Stated Goals to get swelling down and be able to reach- wants to be able to wash herself in the shower    Currently in Pain? Yes    Pain Score 1     Pain Location Shoulder    Pain Orientation Right    Pain Descriptors / Indicators Aching    Pain Type Acute pain    Multiple Pain Sites No                     Flowsheet Row  Outpatient Rehab from 05/14/2020 in [place]  Lymphedema Life Impact Scale Total Score 89.71 %            [facility] Adult PT Treatment/Exercise - 08/27/20 0001      Manual Therapy   Soft tissue mobilization to insertion of rotator cuff tendons at right upper arm and along deltoid    Passive ROM to R shoulder in direction of flexion, abduction and ER with prolonged holds for stretching                       PT Long Term Goals - 08/06/20 1411      PT LONG TERM GOAL #1   Title Pt will demonstrate 140 degrees of R shoulder abduction to allow her to reach out to the side    Baseline 91; 06/11/20- 147    Time 4    Period Weeks    Status Achieved      PT LONG TERM GOAL #2   Title Pt will demonstrate 150 degrees of right shoulder flexion to allow her to reach items on the shelf    Baseline 127; 06/11/20- 127, 07/03/20: 162    Time 4    Period Weeks    Status Achieved      PT LONG TERM GOAL #3   Title Pt will demonstrate 40 degrees of R shoulder IR to allow her to reach back to fasten her bra and perform hygeine    Baseline 19; 06/11/20- 34; 07/09/20- 37    Time 4    Period Weeks    Status On-going      PT LONG TERM GOAL #4   Title Pt will be able to open a jar or a can without assistance from her son to decrease dependence on caregivers.    Baseline 07/09/20- pt reports she is able to open a jar    Time 4    Period Weeks    Status Achieved      PT LONG TERM GOAL #5   Title Pt will receive a compression bra, sleeve and glove for long term management of lymphedema.    Baseline 06/11/20- pt signed up to be measured 12/7; 07/09/20- pt is awaiting arrival of compression bra; 08/06/20- pt received compression bra bu it does not fit correctly, DME supplier is ordering a pad to go in the bra to reduce fibrosis and also have a better fit    Time 4    Period Weeks    Status On-going      Additional Long Term Goals   Additional Long  Term Goals Yes      PT LONG TERM GOAL #6   Title Pt will be independent in a  home exercise program for strengthening and stretching    Time 4    Period Weeks    Status On-going      PT LONG TERM GOAL #7   Title Pt will demosntrate a 50% improvement in fibrosis present throughout R breast to decrease risk of cellulitis    Baseline 07/09/20- 10% improvement; 08/06/20- fibrosis is still present, awaiting pad to go in bra to decrease fibrosis    Time 4    Period Weeks    Status On-going      PT LONG TERM GOAL #8   Title Pt will improve right grip strength (#2 setting) to atleast 34 lbs for improved ability to perform household chores    Baseline 16 lbs; 06/11/20- 30lbs, 07/03/20: 42    Time 5    Period Weeks    Status Achieved      PT LONG TERM GOAL  #9   TITLE Pt will report a 50% improvement in R shoulder pain to allow pt improved comfort.    Time 4    Period Weeks    Status New    Target Date 09/03/20                 Plan - 08/27/20 1338    Clinical Impression Statement Pt still having minimal shoulder pain today. She rates it a 1/10. She reports she has been taking it easy on her shoulder. Pt was only able to stay for half the session so focused on soft tissue moblization to deltoid and rotator cuff tendons where pt had increased discomfort. Pts compression bra was messed up in the dryer so will send pt's updated insurance info to DME supplier per pt request to see cost of getting a replacement.    PT Frequency 2x / week    PT Duration 4 weeks    PT Treatment/Interventions ADLs/Self Care Home Management;Manual lymph drainage;Compression bandaging;Manual techniques;Taping;DME Instruction;Therapeutic exercise;Patient/family education;Passive range of motion;Scar mobilization;Therapeutic activities;Joint Manipulations;Iontophoresis 4mg /ml Dexamethasone    PT Next Visit Plan d/c? assess indep with supine scap, cont Ionto prn, continue MLD of the right breast, PROM. pulleys-  eventually teach strength ABC    PT Home Exercise Plan supine scap ex    Consulted and Agree with Plan of Care Patient           Patient will benefit from skilled therapeutic intervention in order to improve the following deficits and impairments:  Increased edema,Impaired UE functional use,Postural dysfunction,Pain,Decreased knowledge of use of DME,Decreased knowledge of precautions,Decreased scar mobility,Decreased range of motion,Decreased strength,Increased fascial restricitons  Visit Diagnosis: Stiffness of right shoulder, not elsewhere classified  Chronic right shoulder pain     Problem List Patient Active Problem List   Diagnosis Date Noted  . Leukopenia 08/19/2020  . Osteoarthritis of left knee 11/29/2019  . Lymphedema of right arm 10/11/2019  . Upper respiratory infection 07/02/2019  . Type 2 diabetes mellitus with other specified complication ([place]) 00/93/8182  . Hematochezia 03/30/2018  . Insomnia 03/30/2018  . Port-A-Cath in place 03/29/2018  . Genetic testing 01/30/2018  . Neoplasm of breast, regional lymph node staging category N3b: metastasis in ipsilateral internal mammary lymph node and axillary lymph node ([place]) 01/30/2018  . Family history of breast cancer   . Family history of colon cancer   . Family history of lung cancer   . Morbid obesity ([place]) 01/18/2018  . Malignant neoplasm of upper-outer quadrant of right breast in female, estrogen receptor positive ([place]) 01/17/2018  . Achilles tendinitis 11/28/2017  .  Hot flashes 09/30/2017  . Hypertension 09/08/2017  . Contusion of left knee and lower leg 11/18/2015  . Mild intermittent asthma 08/15/2013  . BV (bacterial vaginosis) 08/11/2012  . Right knee pain 07/05/2012  . GERD (gastroesophageal reflux disease) 07/05/2012  . GC (gonococcus) 05/30/2012  . Chlamydia 05/30/2012  . Left knee pain 01/12/2012  . Encounter for long-term (current) use of high-risk medication 02/25/2011  . Preventative health care  02/14/2011  . VITAMIN D DEFICIENCY 01/30/2010  . ANEMIA-IRON DEFICIENCY 01/30/2010  . Edema 01/30/2010  . HIV (human immunodeficiency virus infection) ([place]) 03/12/2009  . GENITAL HERPES 03/12/2009  . Anxiety state 11/27/2007  . 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 Hyperlipidemia 08/02/2007    Allyson Sabal [facility] 08/27/2020, 1:41 PM  [place], [name], [address] Phone: [phone]   Fax:  [phone]  Name: Kelly Rowe MRN: [id number] Date of Birth: 09-23-1970  Manus Gunning, PT 08/27/20 1:41 PM

## 2020-09-03 ENCOUNTER — Ambulatory Visit: Payer: BC Managed Care – PPO | Admitting: Physical Therapy

## 2020-09-11 ENCOUNTER — Ambulatory Visit: Payer: BC Managed Care – PPO | Admitting: Physical Therapy

## 2020-09-11 ENCOUNTER — Other Ambulatory Visit: Payer: Self-pay

## 2020-09-11 DIAGNOSIS — M25611 Stiffness of right shoulder, not elsewhere classified: Secondary | ICD-10-CM | POA: Diagnosis not present

## 2020-09-11 DIAGNOSIS — I890 Lymphedema, not elsewhere classified: Secondary | ICD-10-CM

## 2020-09-11 NOTE — Therapy (Signed)
[place], [name], [address] Phone: [phone]   Fax:  [phone]  Physical Therapy Treatment  Patient Details  Name: Kelly Rowe MRN: [id number] Date of Birth: October 28, 1970 Referring Provider (PT): Dr. Barry Dienes    Encounter Date: 09/11/2020   PT End of Session - 09/11/20 1601    Visit Number 21    Number of Visits 25    Date for PT Re-Evaluation 10/09/20    PT Start Time 1505    PT Stop Time 1550    PT Time Calculation (min) 45 min    Activity Tolerance Patient tolerated treatment well    Behavior During Therapy [facility] for tasks assessed/performed           Past Medical History:  Diagnosis Date  . ANEMIA-IRON DEFICIENCY 01/30/2010  . ANXIETY 11/27/2007  . Arthritis   . Asthma 02/25/2011  . Cancer ([place])    breast  . Carbuncle and furuncle of trunk 04/16/2010  . Chlamydia infection 03/21/2008  . DIABETES MELLITUS, TYPE II 08/02/2007  . Edema 01/30/2010  . ELEVATED BLOOD PRESSURE WITHOUT DIAGNOSIS OF HYPERTENSION 08/02/2007  . Family history of breast cancer   . Family history of colon cancer   . Family history of lung cancer   . FREQUENCY, URINARY 05/19/2009  . GENITAL HERPES 03/12/2009  . GERD (gastroesophageal reflux disease)   . History of kidney stones   . History of radiation therapy 07/27/18- 09/06/18   Right Breast, Right Axillary and Supraclavicular nodes 50 Gy in 25 fractions, Right Breast Boost 10 Gy in 5 fractions  . HIV (human immunodeficiency virus infection) ([place]) 2009  . HIV INFECTION 03/12/2009  . HSV (herpes simplex virus) infection   . HYPERLIPIDEMIA 08/02/2007  . Hypertension   . Metrorrhagia 03/21/2008  . PONV (postoperative nausea and vomiting)    woke up crying per patient   . RETENTION, URINE 05/19/2009  . Sleep apnea   . Trichomonas infection 01/19/2010  . VITAMIN D DEFICIENCY 01/30/2010    Past Surgical History:  Procedure Laterality Date  . ABDOMINAL HYSTERECTOMY   07/22/2010   TAH WITH PRESERVATION OF BOTH TUBES AND OVARIES  . BREAST LUMPECTOMY Right 2019  . BREAST LUMPECTOMY WITH RADIOACTIVE SEED AND SENTINEL LYMPH NODE BIOPSY Right 02/23/2018   Procedure: BREAST LUMPECTOMY WITH RADIOACTIVE SEED AND SENTINEL LYMPH NODE BIOPSY;  Surgeon: Stark Klein, MD;  Location: [place];  Service: General;  Laterality: Right;  . CHOLECYSTECTOMY    . ENDOMETRIAL ABLATION  01/11/2008   HER OPTION  . ESOPHAGOGASTRODUODENOSCOPY    . MULTIPLE TOOTH EXTRACTIONS    . PORT-A-CATH REMOVAL Left 05/08/2019   Procedure: REMOVAL PORT-A-CATH;  Surgeon: Stark Klein, MD;  Location: [place];  Service: General;  Laterality: Left;  . PORTACATH PLACEMENT Left 02/23/2018   Procedure: INSERTION PORT-A-CATH;  Surgeon: Stark Klein, MD;  Location: [place];  Service: General;  Laterality: Left;  . PORTACATH PLACEMENT N/A 04/21/2018   Procedure: PORT-A-CATH REVISION;  Surgeon: Stark Klein, MD;  [facility];  Service: General;  Laterality: N/A;  . TUBAL LIGATION    . WISDOM TOOTH EXTRACTION      There were no vitals filed for this visit.   Subjective Assessment - 09/11/20 1505    Subjective My shoulder has been doing really good. The swelling feels good. I have been putting on my sleeve and it seems more loose.    Pertinent History s/p Rt lumpectomy and SLNB 02/23/18 due to stage IB  triple positive IDC 2 nodes taken pt thinking both negative. Chemotherapy completed 07/05/18, Herceptin started with chemo and continues through August of this year.  Radiation 07/27/18-09/06/18 to the Rt breast, Antiestrogens to follow. Other medical history to include asthma, DM, HTN, HIV, and hysterectomy    Patient Stated Goals to get swelling down and be able to reach- wants to be able to wash herself in the shower    Currently in Pain? No/denies    Pain Score 0-No pain                 LYMPHEDEMA/ONCOLOGY QUESTIONNAIRE - 09/11/20 0001      Left Upper Extremity Lymphedema   15  cm Proximal to Olecranon Process 43.5 cm    Olecranon Process 28.5 cm    15 cm Proximal to Ulnar Styloid Process 29 cm    Just Proximal to Ulnar Styloid Process 17.4 cm    Across Hand at [employer] 20.5 cm    At [place] of 2nd Digit 6.8 cm              Flowsheet Row [facility]  Lymphedema Life Impact Scale Total Score 89.71 %            [facility] Adult PT Treatment/Exercise - 09/11/20 0001      Manual Therapy   Manual Lymphatic Drainage (MLD) short neck, 5 diaphragmatic breaths, superficial and deep abdominals, right inguinal nodes and establishment of axillo inguinal pathway, sternal nodes, and then R breast and R UE, moving fluid towards pathways then retracing all steps. Focus on medial inferior breast with hardness/fibrosis evident under enlarged pores                       PT Long Term Goals - 09/11/20 1514      PT LONG TERM GOAL #1   Title Pt will demonstrate 140 degrees of R shoulder abduction to allow her to reach out to the side    Baseline 91; 06/11/20- 147; 09/11/20- 145    Time 4    Period Weeks    Status Achieved      PT LONG TERM GOAL #2   Title Pt will demonstrate 150 degrees of right shoulder flexion to allow her to reach items on the shelf    Baseline 127; 06/11/20- 127, 07/03/20: 162    Time 4    Period Weeks    Status Achieved      PT LONG TERM GOAL #3   Title Pt will demonstrate 40 degrees of R shoulder IR to allow her to reach back to fasten her bra and perform hygeine    Baseline 19; 06/11/20- 34; 07/09/20- 37; 09/11/20- 36    Time 4    Period Weeks    Status Partially Met      PT LONG TERM GOAL #4   Title Pt will be able to open a jar or a can without assistance from her son to decrease dependence on caregivers.    Baseline 07/09/20- pt reports she is able to open a jar    Time 4    Period Weeks    Status Achieved      PT LONG TERM GOAL #5   Title Pt will  receive a compression bra, sleeve and glove for long term management of lymphedema.    Baseline 06/11/20- pt signed up to be measured 12/7; 07/09/20- pt is awaiting arrival of compression bra; 08/06/20-  pt received compression bra bu it does not fit correctly, DME supplier is ordering a pad to go in the bra to reduce fibrosis and also have a better fit; 09/11/20- pt has received a bra and a compression pad, her dog chewed up her bra and she is going to obtain another one    Time 4    Period Weeks    Status Achieved      Additional Long Term Goals   Additional Long Term Goals Yes      PT LONG TERM GOAL #6   Title Pt will be independent in a home exercise program for strengthening and stretching    Baseline 09/11/20- pt is independent in a home exercise program    Period Weeks    Status Achieved      PT LONG TERM GOAL #7   Title Pt will demosntrate a 50% improvement in fibrosis present throughout R breast to decrease risk of cellulitis    Baseline 07/09/20- 10% improvement; 08/06/20- fibrosis is still present, awaiting pad to go in bra to decrease fibrosis; 09/11/20- fibrosis still present in R breast    Time 4    Period Weeks    Status Not Met      PT LONG TERM GOAL #8   Title Pt will improve right grip strength (#2 setting) to atleast 34 lbs for improved ability to perform household chores    Baseline 16 lbs; 06/11/20- 30lbs, 07/03/20: 42    Time 5    Period Weeks    Status Achieved      PT LONG TERM GOAL  #9   TITLE Pt will report a 50% improvement in R shoulder pain to allow pt improved comfort.    Baseline 09/11/20- 75% improvement    Time 4    Period Weeks    Status Achieved      PT LONG TERM GOAL  #10   TITLE Pt will receive trial of FlexiTouch compression pump for long term management of edema.    Time 4    Period Weeks    Status New    Target Date 10/09/20                 Plan - 09/11/20 1607    Clinical Impression Statement Assessed pt's progress towards goals in  therapy. She has met all of her goals for therapy. She would benefit from a compression pump for management of breast and UE lymphedema. She has been attending therapy since November and has been instructed in self MLD. She has been wearing her compression bra with compression pad over her right breast. Despite this she has failed over four weeks of conservative therapy because she still demonstrates right breast fullness and fibrosis. She also occasionally has swelling in her right arm. Additional goal was added today to address this. Continued with MLD to R breast. Will follow up with pt in one month. Will send pt's demongraphics to [place] per pt request.    PT Frequency [place]    PT Duration 4 weeks    PT Treatment/Interventions ADLs/Self Care Home Management;Manual lymph drainage;Compression bandaging;Manual techniques;Taping;DME Instruction;Therapeutic exercise;Patient/family education;Passive range of motion;Scar mobilization;Therapeutic activities;Joint Manipulations;Iontophoresis 2m/ml Dexamethasone    PT Next Visit Plan reassess breast swelling, see if pt received flexi trial    PT Home Exercise Plan supine scap ex, self MLD    Consulted and Agree with Plan of Care Patient           Patient will benefit from  skilled therapeutic intervention in order to improve the following deficits and impairments:  Increased edema,Impaired UE functional use,Postural dysfunction,Pain,Decreased knowledge of use of DME,Decreased knowledge of precautions,Decreased scar mobility,Decreased range of motion,Decreased strength,Increased fascial restricitons  Visit Diagnosis: Lymphedema, not elsewhere classified     Problem List Patient Active Problem List   Diagnosis Date Noted  . Leukopenia 08/19/2020  . Osteoarthritis of left knee 11/29/2019  . Lymphedema of right arm 10/11/2019  . Upper respiratory infection 07/02/2019  . Type 2 diabetes mellitus with other specified complication ([place]) 05/01/1218   . Hematochezia 03/30/2018  . Insomnia 03/30/2018  . Port-A-Cath in place 03/29/2018  . Genetic testing 01/30/2018  . Neoplasm of breast, regional lymph node staging category N3b: metastasis in ipsilateral internal mammary lymph node and axillary lymph node ([place]) 01/30/2018  . Family history of breast cancer   . Family history of colon cancer   . Family history of lung cancer   . Morbid obesity ([employer]) 01/18/2018  . Malignant neoplasm of upper-outer quadrant of right breast in female, estrogen receptor positive ([place]) 01/17/2018  . Achilles tendinitis 11/28/2017  . Hot flashes 09/30/2017  . Hypertension 09/08/2017  . Contusion of left knee and lower leg 11/18/2015  . Mild intermittent asthma 08/15/2013  . BV (bacterial vaginosis) 08/11/2012  . Right knee pain 07/05/2012  . GERD (gastroesophageal reflux disease) 07/05/2012  . GC (gonococcus) 05/30/2012  . Chlamydia 05/30/2012  . Left knee pain 01/12/2012  . Encounter for long-term (current) use of high-risk medication 02/25/2011  . Preventative health care 02/14/2011  . VITAMIN D DEFICIENCY 01/30/2010  . ANEMIA-IRON DEFICIENCY 01/30/2010  . Edema 01/30/2010  . HIV (human immunodeficiency virus infection) ([place]) 03/12/2009  . GENITAL HERPES 03/12/2009  . Anxiety state 11/27/2007  . 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 Hyperlipidemia 08/02/2007    Allyson Sabal [facility] 09/11/2020, 4:17 PM  [place], [name], [address] Phone: [phone]   Fax:  [phone]  Name: Kelly Rowe MRN: [id number] Date of Birth: [birth year]  Manus Gunning, PT 09/11/20 4:17 PM

## 2020-10-08 ENCOUNTER — Ambulatory Visit: Payer: BC Managed Care – PPO | Attending: General Surgery | Admitting: Physical Therapy

## 2020-10-08 ENCOUNTER — Other Ambulatory Visit: Payer: Self-pay

## 2020-10-08 ENCOUNTER — Encounter: Payer: Self-pay | Admitting: Physical Therapy

## 2020-10-08 DIAGNOSIS — I890 Lymphedema, not elsewhere classified: Secondary | ICD-10-CM

## 2020-10-08 NOTE — Therapy (Signed)
[place], [name], [address] Phone: [phone]   Fax:  [phone]  Physical Therapy Treatment  Patient Details  Name: Kelly Rowe MRN: [id number] Date of Birth: 04/09/1971 Referring Provider (PT): Dr. Barry Dienes    Encounter Date: 10/08/2020   PT End of Session - 10/08/20 1652    Visit Number 22    Number of Visits 25    Date for PT Re-Evaluation 10/09/20    PT Start Time 1604    PT Stop Time 1652    PT Time Calculation (min) 48 min    Activity Tolerance Patient tolerated treatment well    Behavior During Therapy [facility] for tasks assessed/performed           Past Medical History:  Diagnosis Date  . ANEMIA-IRON DEFICIENCY 01/30/2010  . ANXIETY 11/27/2007  . Arthritis   . Asthma 02/25/2011  . Cancer ([place])    breast  . Carbuncle and furuncle of trunk 04/16/2010  . Chlamydia infection 03/21/2008  . DIABETES MELLITUS, TYPE II 08/02/2007  . Edema 01/30/2010  . ELEVATED BLOOD PRESSURE WITHOUT DIAGNOSIS OF HYPERTENSION 08/02/2007  . Family history of breast cancer   . Family history of colon cancer   . Family history of lung cancer   . FREQUENCY, URINARY 05/19/2009  . GENITAL HERPES 03/12/2009  . GERD (gastroesophageal reflux disease)   . History of kidney stones   . History of radiation therapy 07/27/18- 09/06/18   Right Breast, Right Axillary and Supraclavicular nodes 50 Gy in 25 fractions, Right Breast Boost 10 Gy in 5 fractions  . HIV (human immunodeficiency virus infection) ([place]) 2009  . HIV INFECTION 03/12/2009  . HSV (herpes simplex virus) infection   . HYPERLIPIDEMIA 08/02/2007  . Hypertension   . Metrorrhagia 03/21/2008  . PONV (postoperative nausea and vomiting)    woke up crying per patient   . RETENTION, URINE 05/19/2009  . Sleep apnea   . Trichomonas infection 01/19/2010  . VITAMIN D DEFICIENCY 01/30/2010    Past Surgical History:  Procedure Laterality Date  . ABDOMINAL HYSTERECTOMY   07/22/2010   TAH WITH PRESERVATION OF BOTH TUBES AND OVARIES  . BREAST LUMPECTOMY Right 2019  . BREAST LUMPECTOMY WITH RADIOACTIVE SEED AND SENTINEL LYMPH NODE BIOPSY Right 02/23/2018   Procedure: BREAST LUMPECTOMY WITH RADIOACTIVE SEED AND SENTINEL LYMPH NODE BIOPSY;  Surgeon: Stark Klein, MD;  Location: [place];  Service: General;  Laterality: Right;  . CHOLECYSTECTOMY    . ENDOMETRIAL ABLATION  01/11/2008   HER OPTION  . ESOPHAGOGASTRODUODENOSCOPY    . MULTIPLE TOOTH EXTRACTIONS    . PORT-A-CATH REMOVAL Left 05/08/2019   Procedure: REMOVAL PORT-A-CATH;  Surgeon: Stark Klein, MD;  Location: [facility];  Service: General;  Laterality: Left;  . PORTACATH PLACEMENT Left 02/23/2018   Procedure: INSERTION PORT-A-CATH;  Surgeon: Stark Klein, MD;  Location: [facility];  Service: General;  Laterality: Left;  . PORTACATH PLACEMENT N/A 04/21/2018   Procedure: PORT-A-CATH REVISION;  Surgeon: Stark Klein, MD;  Location: [facility];  Service: General;  Laterality: N/A;  . TUBAL LIGATION    . WISDOM TOOTH EXTRACTION      There were no vitals filed for this visit.   Subjective Assessment - 10/08/20 1604    Subjective My shoulder has been doing fine. I am able to reach back without much pain. I use the FlexiTouch and I can tell that it is helping.    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 Chemotherapy completed 07/05/18, Herceptin started with chemo and continues through August of this year.  Radiation 07/27/18-09/06/18 to the Rt breast, Antiestrogens to follow. Other medical history to include asthma, DM, HTN, HIV, and hysterectomy    Patient Stated Goals to get swelling down and be able to reach- wants to be able to wash herself in the shower    Currently in Pain? No/denies    Pain Score 0-No pain                     Flowsheet Row Outpatient Rehab from 05/14/2020 in [place]  Lymphedema Life Impact Scale Total Score 89.71 %            [facility] Adult PT Treatment/Exercise - 10/08/20 0001      Manual Therapy   Manual Lymphatic Drainage (MLD) short neck, 5 diaphragmatic breaths, superficial and deep abdominals, right inguinal nodes and establishment of axillo inguinal pathway, sternal nodes, and then R breast and R UE, moving fluid towards pathways then retracing all steps. Focus on medial inferior breast with hardness/fibrosis evident under enlarged pores                       PT Long Term Goals - 10/08/20 1606      PT LONG TERM GOAL #1   Title Pt will demonstrate 140 degrees of R shoulder abduction to allow her to reach out to the side    Baseline 91; 06/11/20- 147; 09/11/20- 145    Time 4    Period Weeks    Status Achieved      PT LONG TERM GOAL #2   Title Pt will demonstrate 150 degrees of right shoulder flexion to allow her to reach items on the shelf    Baseline 127; 06/11/20- 127, 07/03/20: 162    Time 4    Period Weeks    Status Achieved      PT LONG TERM GOAL #3   Title Pt will demonstrate 40 degrees of R shoulder IR to allow her to reach back to fasten her bra and perform hygeine    Baseline 19; 06/11/20- 34; 07/09/20- 37; 09/11/20- 36; 10/08/20- 47    Time 4    Period Weeks    Status Achieved      PT LONG TERM GOAL #4   Title Pt will be able to open a jar or a can without assistance from her son to decrease dependence on caregivers.    Baseline 07/09/20- pt reports she is able to open a jar    Time 4    Period Weeks    Status Achieved      PT LONG TERM GOAL #5   Title Pt will receive a compression bra, sleeve and glove for long term management of lymphedema.    Baseline 06/11/20- pt signed up to be measured 12/7; 07/09/20- pt is awaiting arrival of compression bra; 08/06/20- pt received compression bra bu it does not fit correctly, DME supplier is ordering a pad to go in the bra to reduce fibrosis  and also have a better fit; 09/11/20- pt has received a bra and a compression pad, her dog chewed up her bra and she is going to obtain another one    Time 4    Period Weeks    Status Achieved      PT LONG TERM GOAL #6  Title Pt will be independent in a home exercise program for strengthening and stretching    Baseline 09/11/20- pt is independent in a home exercise program    Time 4    Period Weeks    Status Achieved      PT LONG TERM GOAL #7   Title Pt will demosntrate a 50% improvement in fibrosis present throughout R breast to decrease risk of cellulitis    Baseline 07/09/20- 10% improvement; 08/06/20- fibrosis is still present, awaiting pad to go in bra to decrease fibrosis; 09/11/20- fibrosis still present in R breast; 10/08/20- 65% improvement    Time 4    Period Weeks    Status Achieved      PT LONG TERM GOAL #8   Title Pt will improve right grip strength (#2 setting) to atleast 34 lbs for improved ability to perform household chores    Baseline 16 lbs; 06/11/20- 30lbs, 07/03/20: 42    Time 5    Status Achieved      PT LONG TERM GOAL  #9   TITLE Pt will report a 50% improvement in R shoulder pain to allow pt improved comfort.    Baseline 09/11/20- 75% improvement    Time 4    Period Weeks    Status Achieved      PT LONG TERM GOAL  #10   TITLE Pt will receive trial of FlexiTouch compression pump for long term management of edema.    Baseline 10/08/20- pt received FlexiTouch and has been using it - reports it is helping    Time 4    Period Weeks    Status Achieved                 Plan - 10/08/20 1653    Clinical Impression Statement Assessed pt's progress towards goals in therapy. Pt has met all goals for therapy. She has recieved the FlexiTouch compression pump and has been using it. She reports this has helped with her swelling. She still has some fibrosis at medial breast so educated pt to wear her compression pad in her compression bra. Pt is ready for discharge  from skilled PT services at this time.    PT Frequency Monthy    PT Duration 4 weeks    PT Treatment/Interventions ADLs/Self Care Home Management;Manual lymph drainage;Compression bandaging;Manual techniques;Taping;DME Instruction;Therapeutic exercise;Patient/family education;Passive range of motion;Scar mobilization;Therapeutic activities;Joint Manipulations;Iontophoresis 44m/ml Dexamethasone    PT Next Visit Plan d/c this visit    PT Home Exercise Plan supine scap ex, self MLD    Consulted and Agree with Plan of Care Patient           Patient will benefit from skilled therapeutic intervention in order to improve the following deficits and impairments:  Increased edema,Impaired UE functional use,Postural dysfunction,Pain,Decreased knowledge of use of DME,Decreased knowledge of precautions,Decreased scar mobility,Decreased range of motion,Decreased strength,Increased fascial restricitons  Visit Diagnosis: Lymphedema, not elsewhere classified     Problem List Patient Active Problem List   Diagnosis Date Noted  . Leukopenia 08/19/2020  . Osteoarthritis of left knee 11/29/2019  . Lymphedema of right arm 10/11/2019  . Upper respiratory infection 07/02/2019  . Type 2 diabetes mellitus with other specified complication ([place] 073/71/0626 . Hematochezia 03/30/2018  . Insomnia 03/30/2018  . Port-A-Cath in place 03/29/2018  . Genetic testing 01/30/2018  . Neoplasm of breast, regional lymph node staging category N3b: metastasis in ipsilateral internal mammary lymph node and axillary lymph node ([place] 01/30/2018  . Family history of  breast cancer   . Family history of colon cancer   . Family history of lung cancer   . Morbid obesity ([place]) 01/18/2018  . Malignant neoplasm of upper-outer quadrant of right breast in female, estrogen receptor positive ([place]) 01/17/2018  . Achilles tendinitis 11/28/2017  . Hot flashes 09/30/2017  . Hypertension 09/08/2017  . Contusion of left knee and lower leg  11/18/2015  . Mild intermittent asthma 08/15/2013  . BV (bacterial vaginosis) 08/11/2012  . Right knee pain 07/05/2012  . GERD (gastroesophageal reflux disease) 07/05/2012  . GC (gonococcus) 05/30/2012  . Chlamydia 05/30/2012  . Left knee pain 01/12/2012  . Encounter for long-term (current) use of high-risk medication 02/25/2011  . Preventative health care 02/14/2011  . VITAMIN D DEFICIENCY 01/30/2010  . ANEMIA-IRON DEFICIENCY 01/30/2010  . Edema 01/30/2010  . HIV (human immunodeficiency virus infection) ([place]) 03/12/2009  . GENITAL HERPES 03/12/2009  . Anxiety state 11/27/2007  . 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 Hyperlipidemia 08/02/2007    Allyson Sabal [facility] 10/08/2020, 4:55 PM  [place], [name], [address] Phone: [phone]   Fax:  [phone]  Name: Kelly Rowe MRN: [id number] Date of Birth: 10-Dec-1970  PHYSICAL THERAPY DISCHARGE SUMMARY  Visits from Start of Care: 22  Current functional level related to goals / functional outcomes: All goals met   Remaining deficits: Some fibrosis at medial breast   Education / Equipment: Compression pump, compression bra, HEP  Plan: Patient agrees to discharge.  Patient goals were met. Patient is being discharged due to meeting the stated rehab goals.  ?????    Allyson Sabal [place], [place] 10/08/20 4:56 PM

## 2020-10-13 NOTE — Progress Notes (Signed)
[place]  [phone] [phone]    ID: Kelly Rowe DOB: Dec 13, 1970  MR#: [id number]  KFM#:[id number]  Patient Care Team: Biagio Borg, MD as PCP - [name], MD as Consulting Physician (General Surgery) Eunice Winecoff, [name], MD as Consulting Physician (Oncology) [name], MD as Attending Physician (Radiation Oncology) Marlinda Mike, PA-C as Referring Physician (Physician Assistant) Gregor Hams, MD as Consulting Physician (Family Medicine) OTHER MD:   CHIEF COMPLAINT: Triple positive breast cancer  CURRENT TREATMENT:  tamoxifen   INTERVAL HISTORY: Kelly Rowe returns today for follow-up of her triple positive breast cancer.  She continues on Tamoxifen with good tolerance.  Hot flashes are better on gabapentin.  They bother her occasionally during the day but this is not something she feels she needs additional medication for  Her most recent at [facility] on 06/27/2020 showed: breast density category A; no evidence of malignancy in either breast.    REVIEW OF SYSTEMS: Kelly Rowe has problems with her knees and her right Achilles tendon and sometimes little pain in her back.  She does not have tenderness or sensitivity or shooting pains in the surgical breast.  She does have some edema there and she has a "machine" which helps her at night and that she thinks is all much improved.  A detailed review of systems today was otherwise stable   COVID 19 VACCINATION STATUS: Refuses vaccination, had COVID December 2021   HISTORY OF CURRENT ILLNESS: From the original intake note:  "Kelly Rowe" noticed bruising in her lateral right breast and palpated a mass  on 12/23/2017. She followed up with her gynecologist. She underwent unilateral right diagnostic mammography with tomography and right breast ultrasonography at [facility] on 01/05/2018 showing: breast density category B. There is an irregular highly suspicious  mass within the right breast at the 9:30 o'clock upper outer quadrant, measuring 1.8 x 1.1 x 1.5 cm, and located 18 cm from the nipple,  Ultrasonography revealed a single morphologically abnormal lymph node in the RIGHT axilla, with cortical thickness of 6 mm.   Accordingly on 01/11/2018 she proceeded to biopsy of the right breast area in question as well as a suspicious lymph node. The pathology from this procedure showed ([id number]): Invasive ductal carcinoma grade III.  The lymph node biopsied was negative for carcinoma (concordant).. Prognostic indicators significant for: estrogen receptor, 60% positive with weak staining intensity and progesterone receptor, 10% positive with strong staining intensity. Proliferation marker Ki67 at 30%. HER2 amplified with ratios HER2/CEP17 signals 2.26 and average HER2 copies per cell 3.50  The patient's subsequent history is as detailed below.   PAST MEDICAL HISTORY: Past Medical History:  Diagnosis Date  . ANEMIA-IRON DEFICIENCY 01/30/2010  . ANXIETY 11/27/2007  . Arthritis   . Asthma 02/25/2011  . Cancer ([place])    breast  . Carbuncle and furuncle of trunk 04/16/2010  . Chlamydia infection 03/21/2008  . DIABETES MELLITUS, TYPE II 08/02/2007  . Edema 01/30/2010  . ELEVATED BLOOD PRESSURE WITHOUT DIAGNOSIS OF HYPERTENSION 08/02/2007  . Family history of breast cancer   . Family history of colon cancer   . Family history of lung cancer   . FREQUENCY, URINARY 05/19/2009  . GENITAL HERPES 03/12/2009  . GERD (gastroesophageal reflux disease)   . History of kidney stones   . History of radiation therapy 07/27/18- 09/06/18   Right Breast, Right Axillary and Supraclavicular nodes 50 Gy in 25 fractions, Right Breast Boost 10 Gy in 5 fractions  .  HIV (human immunodeficiency virus infection) ([place]) 2009  . HIV INFECTION 03/12/2009  . HSV (herpes simplex virus) infection   . HYPERLIPIDEMIA 08/02/2007  . Hypertension   . Metrorrhagia 03/21/2008  . PONV (postoperative nausea  and vomiting)    woke up crying per patient   . RETENTION, URINE 05/19/2009  . Sleep apnea   . Trichomonas infection 01/19/2010  . VITAMIN D DEFICIENCY 01/30/2010    PAST SURGICAL HISTORY: Past Surgical History:  Procedure Laterality Date  . ABDOMINAL HYSTERECTOMY  07/22/2010   TAH WITH PRESERVATION OF BOTH TUBES AND OVARIES  . BREAST LUMPECTOMY Right 2019  . BREAST LUMPECTOMY WITH RADIOACTIVE SEED AND SENTINEL LYMPH NODE BIOPSY Right 02/23/2018   Procedure: BREAST LUMPECTOMY WITH RADIOACTIVE SEED AND SENTINEL LYMPH NODE BIOPSY;  Surgeon: Stark Klein, MD;  Location: [facility];  Service: General;  Laterality: Right;  . CHOLECYSTECTOMY    . ENDOMETRIAL ABLATION  01/11/2008   HER OPTION  . ESOPHAGOGASTRODUODENOSCOPY    . MULTIPLE TOOTH EXTRACTIONS    . PORT-A-CATH REMOVAL Left 05/08/2019   Procedure: REMOVAL PORT-A-CATH;  Surgeon: Stark Klein, MD;  Location: [facility];  Service: General;  Laterality: Left;  . PORTACATH PLACEMENT Left 02/23/2018   Procedure: INSERTION PORT-A-CATH;  Surgeon: Stark Klein, MD;  Location: [facility];  Service: General;  Laterality: Left;  . PORTACATH PLACEMENT N/A 04/21/2018   Procedure: PORT-A-CATH REVISION;  Surgeon: Stark Klein, MD;  Location: [facility];  Service: General;  Laterality: N/A;  . TUBAL LIGATION    . WISDOM TOOTH EXTRACTION      FAMILY HISTORY: Family History  Problem Relation Age of Onset  . Prostate cancer Other   . Heart disease Other   . Stroke Other   . Diabetes Mother   . Hypertension Mother   . Diabetes Father   . Cancer Father        COLON and LU NG  . Colon cancer Father   . Stroke Paternal Uncle   . Breast cancer Paternal Aunt   . Lung cancer Maternal Grandmother 86       Mesothelioma  . Colon cancer Paternal Grandfather        dx over 39s  . Lung cancer Paternal Aunt   . Breast cancer Paternal Aunt   . Breast cancer Paternal Aunt   . Heart attack Maternal Grandfather 71  . Colon cancer Other        MGM's 5  brothers  The patient's father died at age 78 due to metastatic liver cancer. The patient's mother is alive at 11. The patient's has 1 brother and 3 sisters. There was a paternal grandfather with colon cancer. There were 3 paternal aunts with breast cancer, 2 diagnosed in the 74's and 1 at age 83. There was a maternal grandmother with mesothelioma. The patient otherwise denies a history of ovarian cancer in the family.    GYNECOLOGIC HISTORY:  Patient's last menstrual period was 06/21/2010. Menarche: 50 years old Age at first live birth: 50 years old She is GXP5. Her LMP was December 2011. She is status post partial hysterectomy without oophorectomy. She took oral contraception for 3 years with no complications. She never took HRT.    SOCIAL HISTORY: (Updated January 2022). 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  She is currently applying for disability.  At home are 2 of her sons, Kelly Rowe and Kelly Rowe, and the patient's grandson, Kelly Rowe  (who is Willie's son). The patient's oldest is Kelly Rowe who works  in [place] as a cook. Daughter, Kelly Rowe  works as a Scientist, water quality. Son, Kelly Rowe is disabled. Son, Kelly Rowe works in [place] as a Microbiologist. Daughter, Kelly Rowe also is a Microbiologist. The patient has 5 grandchildren and no great grandchildren. The patient does not belong to a church.    ADVANCED DIRECTIVES: Not in place; at the 01/18/2018 visit the patient was given the appropriate documents to complete on notarized at her discretion   HEALTH MAINTENANCE: Social History   Tobacco Use  . Smoking status: Never Smoker  . Smokeless tobacco: Never Used  Vaping Use  . Vaping Use: Never used  Substance Use Topics  . Alcohol use: Yes    Alcohol/week: 0.0 standard drinks    Comment: occ  . Drug use: No     Colonoscopy: Not yet  PAP: November 2018  Bone density: Never   Allergies  Allergen Reactions  . Metformin And Related Other (See Comments)    Bowel frequency  . Levaquin [Levofloxacin In  D5w] Nausea And Vomiting and Rash  . Penicillins Swelling and Rash    Has patient had a PCN reaction causing immediate rash, facial/tongue/throat swelling, SOB or lightheadedness with hypotension: Yes Has patient had a PCN reaction causing severe rash involving mucus membranes or skin necrosis: No Has patient had a PCN reaction that required hospitalization: Yes Has patient had a PCN reaction occurring within the last 10 years: No If all of the above answers are "NO", then may proceed with Cephalosporin use.    Current Outpatient Medications  Medication Sig Dispense Refill  . albuterol (VENTOLIN HFA) 108 (90 Base) MCG/ACT inhaler Inhale 2 puffs into the lungs every 6 (six) hours as needed for wheezing or shortness of breath. 1 g 11  . bictegravir-emtricitabine-tenofovir AF (BIKTARVY) 50-200-25 MG TABS tablet Take 1 tablet by mouth daily.    . blood glucose meter kit and supplies Dispense based on patient and insurance preference. Use up to four times daily as directed. (FOR ICD-10 E10.9, E11.9). 1 each 0  . carvedilol (COREG) 6.25 MG tablet Take 1 tablet (6.25 mg total) by mouth 2 (two) times daily. 60 tablet 11  . colchicine 0.6 MG tablet Take 1 tablet (0.6 mg total) by mouth daily. 90 tablet 3  . Diclofenac Sodium (PENNSAID) 2 % SOLN Place 1 application onto the skin 2 (two) times daily. 112 g 2  . Doxepin HCl 3 MG TABS Take 1 tablet (3 mg total) by mouth at bedtime. 30 tablet 3  . Dulaglutide (TRULICITY) 3 NV/9.52YO SOPN Inject 3 mg into the skin once a week. 6 mL 3  . ferrous gluconate (FERGON) 240 (27 FE) MG tablet Take 240 mg by mouth daily as needed (iron).     . gabapentin (NEURONTIN) 300 MG capsule Take 1 capsule (300 mg total) by mouth at bedtime. 90 capsule 4  . glipiZIDE (GLUCOTROL XL) 10 MG 24 hr tablet Take 1 tablet (10 mg total) by mouth daily with breakfast. 90 tablet 3  . glucose blood (ONETOUCH VERIO) test strip USE 1 STRIP TO CHECK GLUCOSE 4 TIMES DAILY AS DIRECTED. DX: E11.69  100 each 5  . hydrocortisone (ANUSOL-HC) 2.5 % rectal cream Place 1 application rectally 2 (two) times daily. 30 g 1  . ibuprofen (ADVIL,MOTRIN) 800 MG tablet Take 1 tablet (800 mg total) by mouth every 8 (eight) hours as needed. 60 tablet 0  . Insulin Glargine (BASAGLAR KWIKPEN) 100 UNIT/ML Inject 10 Units into the skin daily. 15 mL 11  . Insulin Pen  Needle (PEN NEEDLES) 32G X 4 MM MISC Use with insulin pen 100 each 12  . Lancets (ONETOUCH DELICA PLUS [id number]) MISC USE TO TEST BLOOD SUGAR UP TO 4 TIMES DAILY 100 each 5  . loratadine (CLARITIN) 10 MG tablet Take 10 mg by mouth daily.    Marland Kitchen losartan (COZAAR) 25 MG tablet Take 1 tablet (25 mg total) by mouth daily. 30 tablet 11  . Multiple Vitamin (MULTIVITAMIN WITH MINERALS) TABS tablet Take 1 tablet by mouth daily.    . nitroGLYCERIN (NITRODUR - DOSED IN MG/24 HR) 0.2 mg/hr patch Apply 1/4 patch daily to tendon for tendonitis. 30 patch 1  . nystatin-triamcinolone ointment (MYCOLOG) Apply 1 application topically 2 (two) times daily. 30 g 1  . pioglitazone (ACTOS) 45 MG tablet Take 1 tablet (45 mg total) by mouth daily. 90 tablet 3  . rosuvastatin (CRESTOR) 10 MG tablet Take 1 tablet (10 mg total) by mouth daily. 90 tablet 3  . tamoxifen (NOLVADEX) 20 MG tablet Take 1 tablet (20 mg total) by mouth daily. 90 tablet 4  . valACYclovir (VALTREX) 500 MG tablet Take twice daily for 3-5 days 30 tablet 12  . venlafaxine XR (EFFEXOR-XR) 75 MG 24 hr capsule Take 1 capsule (75 mg total) by mouth daily with breakfast. 90 capsule 4  . zolpidem (AMBIEN) 10 MG tablet Take 1 tablet (10 mg total) by mouth at bedtime as needed. for sleep 90 tablet 1   No current facility-administered medications for this visit.    OBJECTIVE: African-American woman in no acute distress  Vitals:   10/14/20 1107  BP: 124/75  Pulse: 84  Resp: 18  Temp: (!) 97.5 F (36.4 C)  SpO2: 99%     Body mass index is 49.58 kg/m.   Wt Readings from Last 3 Encounters:  10/14/20 271 lb  1.6 oz (123 kg)  08/19/20 268 lb 6.4 oz (121.7 kg)  07/16/20 261 lb (118.4 kg)  ECOG FS: 2 - Symptomatic, <50% confined to bed  Sclerae unicteric, EOMs intact Wearing a mask No cervical or supraclavicular adenopathy Lungs no rales or rhonchi Heart regular rate and rhythm Abd soft, nontender, positive bowel sounds MSK no focal spinal tenderness, no upper extremity lymphedema Neuro: nonfocal, well oriented, appropriate affect Breasts: Right breast is status post lumpectomy and radiation.  There is the hyperpigmentation and coarsening of the skin that is expected but no evidence of local recurrence.  The left breast and both axillae are benign.   LAB RESULTS:  CMP     Component Value Date/Time   NA 139 08/19/2020 1505   K 3.8 08/19/2020 1505   CL 103 08/19/2020 1505   CO2 28 08/19/2020 1505   GLUCOSE 125 (H) 08/19/2020 1505   BUN 12 08/19/2020 1505   CREATININE 0.85 08/19/2020 1505   CREATININE 1.04 (H) 06/18/2020 1202   CALCIUM 8.8 (L) 08/19/2020 1505   PROT 6.6 08/19/2020 1505   ALBUMIN 3.3 (L) 08/19/2020 1505   AST 18 08/19/2020 1505   AST 29 06/18/2020 1202   ALT 13 08/19/2020 1505   ALT 26 06/18/2020 1202   ALKPHOS 100 08/19/2020 1505   BILITOT 0.3 08/19/2020 1505   BILITOT 0.5 06/18/2020 1202   GFRNONAA >60 08/19/2020 1505   GFRNONAA >60 06/18/2020 1202   GFRAA >60 04/16/2020 1119   GFRAA >60 10/11/2019 1054    No results found for: TOTALPROTELP, ALBUMINELP, A1GS, A2GS, BETS, BETA2SER, GAMS, MSPIKE, SPEI  No results found for: KPAFRELGTCHN, LAMBDASER, KAPLAMBRATIO  Lab Results  Component Value Date   WBC 3.1 (L) 10/14/2020   NEUTROABS 1.6 (L) 10/14/2020   HGB 12.6 10/14/2020   HCT 38.8 10/14/2020   MCV 86.0 10/14/2020   PLT 205 10/14/2020   No results found for: LABCA2  No components found for: [id number]  No results for input(s): INR in the last 168 hours.  No results found for: LABCA2  No results found for: RXY585  No results found for:  FYT244  No results found for: QKM638  No results found for: CA2729  No components found for: HGQUANT  No results found for: CEA1 / No results found for: CEA1   No results found for: AFPTUMOR  No results found for: CHROMOGRNA  No results found for: HGBA, HGBA2QUANT, HGBFQUANT, HGBSQUAN (Hemoglobinopathy evaluation)   No results found for: LDH  Lab Results  Component Value Date   IRON 75 10/03/2019   IRONPCTSAT 20.9 10/03/2019   (Iron and TIBC)  No results found for: FERRITIN  Urinalysis    Component Value Date/Time   COLORURINE STRAW (A) 06/18/2020 1105   APPEARANCEUR CLEAR 06/18/2020 1105   LABSPEC 1.029 06/18/2020 1105   PHURINE 5.0 06/18/2020 1105   GLUCOSEU >=500 (A) 06/18/2020 1105   GLUCOSEU NEGATIVE 10/03/2019 1426   HGBUR NEGATIVE 06/18/2020 1105   BILIRUBINUR NEGATIVE 06/18/2020 1105   KETONESUR 80 (A) 06/18/2020 1105   PROTEINUR NEGATIVE 06/18/2020 1105   UROBILINOGEN 0.2 10/03/2019 1426   NITRITE NEGATIVE 06/18/2020 [place] 06/18/2020 1105    STUDIES: No results found.   ELIGIBLE FOR AVAILABLE RESEARCH PROTOCOL: no   ASSESSMENT: 50 y.o. [place], [name] woman status post right breast upper outer quadrant biopsy 01/11/2018 for a clinical T1 N0, stage IA invasive ductal carcinoma, grade 3, estrogen and progesterone receptor positive, HER-2 amplified, with an MIB-1 of 30%.  (1) status post right lumpectomy and sentinel lymph node sampling 02/23/2018 for a pT2 pN1, stage IB invasive ductal carcinoma, grade 3, with close but negative margins  (2) adjuvant chemotherapy consisting of carboplatin, docetaxel, trastuzumab and Pertuzumab every 21 days x 6 given between 03/08/2018-07/05/2018 (a) pertuzumab stopped after cycle 1 due to diarrhea (b) docetaxel changed to gemcitabine after cycle 2 due to persistent hyperglycemia (c) Gemcitabine and Carboplatin dose reduced by approximately 10% due to delayed neutropenia and  thrombocytopenia  (3) anti-HER-2 immunotherapy started concurrently with chemotherapy (a) baseline echocardiogram on 02/07/2018 showed an ejection fraction in the 55-60% range (b) repeat echo 06/19/2018 showed an ejection fraction of 55-60% (c) repeat echocardiogram 11/14/2018 showed an EF in the 50-55% range, with normal strain. (d) echo 01/31/2019 shows an ejection fraction in the 55/60% range (e) final trastuzumab dose 03/12/2019  (4) adjuvant radiation 07/27/2018 - 09/06/2018  Site/dose: 1. Right Breast / 50 Gy in 25 fractions    2. Right Supraclavicular nodes / 50 Gy in 25 fractions    3. Right Breast Boost / 10 Gy in 5 fractions  (5) tamoxifen started November 2020  (6) genetics testing through [facility] Multi-cancer and Breast panel on 01/13/2018 showed: no deleterious mutations. The following genes were evaluated for sequence changes and exonic deletions/duplications: ALK, APC, ATM, AXIN2, BAP1, BARD1, BLM, BMPR1A, BRCA1, BRCA2, BRIP1, CASR, CDC73, CDH1, CDK4, CDKN1B, CDKN1C,CDKN2A (p14ARF), CDKN2A (p16INK4a), CEBPA, CHEK2, CTNNA1, DICER1, DIS3L2, EPCAM*, FH, FLCN, GATA2, GPC3, GREM1*, HRAS, KIT, MAX, MEN1, MET, MLH1, MSH2, MSH3, MSH6, MUTYH, NBN, NF1, NF2, PALB2, PDGFRA, PHOX2B*, PMS2, POLD1,Rowe, POT1, PRKAR1A, PTCH1, PTEN, RAD50, RAD51C, RAD51D, RB1, RECQL4, RET, RUNX1, SDHAF2, SDHB, SDHC, SDHD,SMAD4, SMARCA4, SMARCB1,  SMARCE1, STK11, SUFU, TERC, TERT, TMEM127, TP53, TSC1, TSC2, VHL, WRN*, WT1.The following genes were evaluated for sequence changes only: EGFR*, HOXB13*, MITF*, NTHL1*, SDHA   PLAN: Maydelin will soon be 3 years out from definitive surgery for her breast cancer with no evidence of disease recurrence.  This is very febrile.  She is tolerating tamoxifen well and the plan is to continue that a minimum of 5 years.  I am glad the venlafaxine is helping with the hot flashes.  I do not think the problems she is having with her knees and lower back is going to be related to  her breast cancer and that of course is all being followed through her primary care physician.  At this point I am comfortable seeing her on a once a year basis.  She will have her next mammogram in December.  She knows to call for any other issue that may develop before then  Total encounter time 20 minutes.Sarajane Jews C. Dequavius Kuhner, MD 10/14/20 11:26 AM Medical Oncology and Hematology [facility] [place], [place] [address] Tel. [phone]    Fax. [phone]   I, Wilburn Mylar, am acting as scribe for Dr. [name]. Wing Gfeller.  I, Lurline Del MD, have reviewed the above documentation for accuracy and completeness, and I agree with the above.   *Total Encounter Time as defined by the Centers for Medicare and Medicaid Services includes, in addition to the face-to-face time of a patient visit (documented in the note above) non-face-to-face time: obtaining and reviewing outside history, ordering and reviewing medications, tests or procedures, care coordination (communications with other health care professionals or caregivers) and documentation in the medical record.

## 2020-10-14 ENCOUNTER — Other Ambulatory Visit: Payer: Self-pay

## 2020-10-14 ENCOUNTER — Inpatient Hospital Stay: Payer: BC Managed Care – PPO

## 2020-10-14 ENCOUNTER — Inpatient Hospital Stay: Payer: BC Managed Care – PPO | Attending: Adult Health | Admitting: Oncology

## 2020-10-14 VITALS — BP 124/75 | HR 84 | Temp 97.5°F | Resp 18 | Ht 62.0 in | Wt 271.1 lb

## 2020-10-14 DIAGNOSIS — Z8042 Family history of malignant neoplasm of prostate: Secondary | ICD-10-CM | POA: Insufficient documentation

## 2020-10-14 DIAGNOSIS — Z79899 Other long term (current) drug therapy: Secondary | ICD-10-CM | POA: Diagnosis not present

## 2020-10-14 DIAGNOSIS — Z823 Family history of stroke: Secondary | ICD-10-CM | POA: Diagnosis not present

## 2020-10-14 DIAGNOSIS — Z87442 Personal history of urinary calculi: Secondary | ICD-10-CM | POA: Insufficient documentation

## 2020-10-14 DIAGNOSIS — D696 Thrombocytopenia, unspecified: Secondary | ICD-10-CM | POA: Diagnosis not present

## 2020-10-14 DIAGNOSIS — Z833 Family history of diabetes mellitus: Secondary | ICD-10-CM | POA: Insufficient documentation

## 2020-10-14 DIAGNOSIS — Z881 Allergy status to other antibiotic agents status: Secondary | ICD-10-CM | POA: Diagnosis not present

## 2020-10-14 DIAGNOSIS — R232 Flushing: Secondary | ICD-10-CM | POA: Insufficient documentation

## 2020-10-14 DIAGNOSIS — Z170 Estrogen receptor positive status [ER+]: Secondary | ICD-10-CM | POA: Diagnosis present

## 2020-10-14 DIAGNOSIS — E785 Hyperlipidemia, unspecified: Secondary | ICD-10-CM | POA: Diagnosis not present

## 2020-10-14 DIAGNOSIS — Z9049 Acquired absence of other specified parts of digestive tract: Secondary | ICD-10-CM | POA: Insufficient documentation

## 2020-10-14 DIAGNOSIS — Z880 Allergy status to penicillin: Secondary | ICD-10-CM | POA: Insufficient documentation

## 2020-10-14 DIAGNOSIS — I10 Essential (primary) hypertension: Secondary | ICD-10-CM | POA: Diagnosis not present

## 2020-10-14 DIAGNOSIS — Z79810 Long term (current) use of selective estrogen receptor modulators (SERMs): Secondary | ICD-10-CM | POA: Insufficient documentation

## 2020-10-14 DIAGNOSIS — E1169 Type 2 diabetes mellitus with other specified complication: Secondary | ICD-10-CM

## 2020-10-14 DIAGNOSIS — C50411 Malignant neoplasm of upper-outer quadrant of right female breast: Secondary | ICD-10-CM | POA: Insufficient documentation

## 2020-10-14 DIAGNOSIS — E1165 Type 2 diabetes mellitus with hyperglycemia: Secondary | ICD-10-CM | POA: Insufficient documentation

## 2020-10-14 DIAGNOSIS — Z803 Family history of malignant neoplasm of breast: Secondary | ICD-10-CM | POA: Diagnosis not present

## 2020-10-14 DIAGNOSIS — Z801 Family history of malignant neoplasm of trachea, bronchus and lung: Secondary | ICD-10-CM | POA: Diagnosis not present

## 2020-10-14 DIAGNOSIS — R509 Fever, unspecified: Secondary | ICD-10-CM | POA: Insufficient documentation

## 2020-10-14 DIAGNOSIS — Z800 Family history of malignant neoplasm of digestive organs: Secondary | ICD-10-CM | POA: Insufficient documentation

## 2020-10-14 DIAGNOSIS — R222 Localized swelling, mass and lump, trunk: Secondary | ICD-10-CM | POA: Diagnosis not present

## 2020-10-14 DIAGNOSIS — C50911 Malignant neoplasm of unspecified site of right female breast: Secondary | ICD-10-CM

## 2020-10-14 DIAGNOSIS — Z8249 Family history of ischemic heart disease and other diseases of the circulatory system: Secondary | ICD-10-CM | POA: Diagnosis not present

## 2020-10-14 DIAGNOSIS — Z923 Personal history of irradiation: Secondary | ICD-10-CM | POA: Insufficient documentation

## 2020-10-14 LAB — CBC WITH DIFFERENTIAL/PLATELET
Abs Immature Granulocytes: 0.01 10*3/uL (ref 0.00–0.07)
Basophils Absolute: 0 10*3/uL (ref 0.0–0.1)
Basophils Relative: 1 %
Eosinophils Absolute: 0 10*3/uL (ref 0.0–0.5)
Eosinophils Relative: 0 %
HCT: 38.8 % (ref 36.0–46.0)
Hemoglobin: 12.6 g/dL (ref 12.0–15.0)
Immature Granulocytes: 0 %
Lymphocytes Relative: 39 %
Lymphs Abs: 1.2 10*3/uL (ref 0.7–4.0)
MCH: 27.9 pg (ref 26.0–34.0)
MCHC: 32.5 g/dL (ref 30.0–36.0)
MCV: 86 fL (ref 80.0–100.0)
Monocytes Absolute: 0.2 10*3/uL (ref 0.1–1.0)
Monocytes Relative: 7 %
Neutro Abs: 1.6 10*3/uL — ABNORMAL LOW (ref 1.7–7.7)
Neutrophils Relative %: 53 %
Platelets: 205 10*3/uL (ref 150–400)
RBC: 4.51 MIL/uL (ref 3.87–5.11)
RDW: 15.2 % (ref 11.5–15.5)
WBC: 3.1 10*3/uL — ABNORMAL LOW (ref 4.0–10.5)
nRBC: 0 % (ref 0.0–0.2)

## 2020-10-14 LAB — COMPREHENSIVE METABOLIC PANEL
ALT: 22 U/L (ref 0–44)
AST: 18 U/L (ref 15–41)
Albumin: 3.7 g/dL (ref 3.5–5.0)
Alkaline Phosphatase: 120 U/L (ref 38–126)
Anion gap: 16 — ABNORMAL HIGH (ref 5–15)
BUN: 11 mg/dL (ref 6–20)
CO2: 26 mmol/L (ref 22–32)
Calcium: 9.3 mg/dL (ref 8.9–10.3)
Chloride: 98 mmol/L (ref 98–111)
Creatinine, Ser: 0.84 mg/dL (ref 0.44–1.00)
GFR, Estimated: >60 mL/min (ref >60)
Glucose, Bld: 284 mg/dL — ABNORMAL HIGH (ref 70–99)
Potassium: 4 mmol/L (ref 3.5–5.1)
Sodium: 140 mmol/L (ref 135–145)
Total Bilirubin: 0.5 mg/dL (ref 0.3–1.2)
Total Protein: 7.4 g/dL (ref 6.5–8.1)

## 2020-10-14 MED ORDER — TAMOXIFEN CITRATE 20 MG PO TABS: 20.0000 mg | ORAL_TABLET | Freq: Every day | ORAL | 4 refills | Status: DC

## 2020-10-15 ENCOUNTER — Telehealth: Payer: Self-pay | Admitting: Oncology

## 2020-10-15 ENCOUNTER — Ambulatory Visit: Payer: BC Managed Care – PPO | Admitting: Internal Medicine

## 2020-10-15 NOTE — Telephone Encounter (Signed)
Scheduled appts per 3/22 los. Called pt, no answer. Left msg with appts date and times.

## 2020-10-17 ENCOUNTER — Other Ambulatory Visit: Payer: Self-pay

## 2020-10-17 ENCOUNTER — Encounter: Payer: Self-pay | Admitting: Internal Medicine

## 2020-10-17 ENCOUNTER — Ambulatory Visit: Payer: BC Managed Care – PPO | Admitting: Internal Medicine

## 2020-10-17 VITALS — BP 128/80 | HR 90 | Temp 98.6°F | Ht 62.0 in | Wt 272.0 lb

## 2020-10-17 DIAGNOSIS — E538 Deficiency of other specified B group vitamins: Secondary | ICD-10-CM

## 2020-10-17 DIAGNOSIS — M25561 Pain in right knee: Secondary | ICD-10-CM | POA: Diagnosis not present

## 2020-10-17 DIAGNOSIS — E7800 Pure hypercholesterolemia, unspecified: Secondary | ICD-10-CM

## 2020-10-17 DIAGNOSIS — E1169 Type 2 diabetes mellitus with other specified complication: Secondary | ICD-10-CM | POA: Diagnosis not present

## 2020-10-17 DIAGNOSIS — I10 Essential (primary) hypertension: Secondary | ICD-10-CM

## 2020-10-17 DIAGNOSIS — M7662 Achilles tendinitis, left leg: Secondary | ICD-10-CM | POA: Diagnosis not present

## 2020-10-17 DIAGNOSIS — Z0001 Encounter for general adult medical examination with abnormal findings: Secondary | ICD-10-CM | POA: Diagnosis not present

## 2020-10-17 DIAGNOSIS — E559 Vitamin D deficiency, unspecified: Secondary | ICD-10-CM

## 2020-10-17 DIAGNOSIS — G8929 Other chronic pain: Secondary | ICD-10-CM

## 2020-10-17 DIAGNOSIS — M25562 Pain in left knee: Secondary | ICD-10-CM

## 2020-10-17 LAB — LIPID PANEL
Cholesterol: 207 mg/dL — ABNORMAL HIGH (ref 0–200)
HDL: 56.7 mg/dL (ref >39.00)
LDL Cholesterol: 120 mg/dL — ABNORMAL HIGH (ref 0–99)
NonHDL: 150.52
Total CHOL/HDL Ratio: 4
Triglycerides: 152 mg/dL — ABNORMAL HIGH (ref 0.0–149.0)
VLDL: 30.4 mg/dL (ref 0.0–40.0)

## 2020-10-17 LAB — MICROALBUMIN / CREATININE URINE RATIO
Creatinine,U: 99.6 mg/dL
Microalb Creat Ratio: 0.7 mg/g (ref 0.0–30.0)
Microalb, Ur: <0.7 mg/dL (ref 0.0–1.9)

## 2020-10-17 LAB — VITAMIN B12: Vitamin B-12: 1471 pg/mL — ABNORMAL HIGH (ref 211–911)

## 2020-10-17 LAB — URINALYSIS, ROUTINE W REFLEX MICROSCOPIC
Bilirubin Urine: NEGATIVE
Hgb urine dipstick: NEGATIVE
Leukocytes,Ua: NEGATIVE
Nitrite: NEGATIVE
Specific Gravity, Urine: 1.025 (ref 1.000–1.030)
Total Protein, Urine: NEGATIVE
Urine Glucose: >=1000 — AB
Urobilinogen, UA: 0.2 (ref 0.0–1.0)
pH: 6 (ref 5.0–8.0)

## 2020-10-17 LAB — BASIC METABOLIC PANEL
BUN: 14 mg/dL (ref 6–23)
CO2: 32 mEq/L (ref 19–32)
Calcium: 9.5 mg/dL (ref 8.4–10.5)
Chloride: 98 mEq/L (ref 96–112)
Creatinine, Ser: 0.71 mg/dL (ref 0.40–1.20)
GFR: 98.17 mL/min (ref >60.00)
Glucose, Bld: 277 mg/dL — ABNORMAL HIGH (ref 70–99)
Potassium: 4.1 mEq/L (ref 3.5–5.1)
Sodium: 138 mEq/L (ref 135–145)

## 2020-10-17 LAB — TSH: TSH: 2.66 u[IU]/mL (ref 0.35–4.50)

## 2020-10-17 LAB — VITAMIN D 25 HYDROXY (VIT D DEFICIENCY, FRACTURES): VITD: 25.96 ng/mL — ABNORMAL LOW (ref 30.00–100.00)

## 2020-10-17 LAB — HEMOGLOBIN A1C: Hgb A1c MFr Bld: 9.1 % — ABNORMAL HIGH (ref 4.6–6.5)

## 2020-10-17 NOTE — Progress Notes (Signed)
Patient ID: Kelly Rowe, female   DOB: [birth year], 50 y.o.   MRN: [id number]         Chief Complaint:: wellness exam and Follow-up  left achilles pain, dm, htn, hld, bilateral knee pain       HPI:  Kelly Rowe is a 50 y.o. female here for wellness exam; still planning to call for mammogram ordered jan 2022, o/w up to date with preventive referral and mmunizations                        Also c/p > 6 mo persistent left achilles pain mild to mod, intermittent, worse to walk, better to sit, dull and sharp occasionally, and NTG patch just not seems to work as per sports medicine.  Pt denies chest pain, increased sob or doe, wheezing, orthopnea, PND, increased LE swelling, palpitations, dizziness or syncope.   Pt denies polydipsia, polyuria,denies worsening focal neuro s/s.   Pt denies fever, wt loss, night sweats, loss of appetite, or other constitutional symptoms  No other new complaints except for ongoing bilateral knee pain, mild to mod, chronic, dull , no swelling, giveaways ro falls, worse to walk or stand > 20 min.    Wt Readings from Last 3 Encounters:  10/17/20 272 lb (123.4 kg)  10/14/20 271 lb 1.6 oz (123 kg)  08/19/20 268 lb 6.4 oz (121.7 kg)   BP Readings from Last 3 Encounters:  10/17/20 128/80  10/14/20 124/75  08/19/20 112/83   Immunization History  Administered Date(s) Administered  . Hep A / Hep B 09/05/2008, 10/31/2008, 05/01/2009  . Influenza Split 05/30/2012, 03/31/2016  . Influenza,inj,Quad PF,6+ Mos 05/30/2012, 03/31/2016, 04/05/2019  . Influenza,inj,quad, With Preservative 08/08/2013, 05/10/2014  . Influenza,trivalent, recombinat, inj, PF 03/31/2018  . PPD Test 01/10/2015  . Pneumococcal Conjugate-13 08/08/2013  . Pneumococcal Polysaccharide-23 05/23/2008, 05/10/2014  . Td 03/12/2009  . Tdap 11/02/2010, 04/05/2019   There are no preventive care reminders to display for this patient.    Past Medical History:  Diagnosis Date  . 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 ANXIETY 11/27/2007  . Arthritis   . Asthma 02/25/2011  . Cancer ([place])    breast  . Carbuncle and furuncle of trunk 04/16/2010  . Chlamydia infection 03/21/2008  . DIABETES MELLITUS, TYPE II 08/02/2007  . Edema 01/30/2010  . ELEVATED BLOOD PRESSURE WITHOUT DIAGNOSIS OF HYPERTENSION 08/02/2007  . Family history of breast cancer   . Family history of colon cancer   . Family history of lung cancer   . FREQUENCY, URINARY 05/19/2009  . GENITAL HERPES 03/12/2009  . GERD (gastroesophageal reflux disease)   . History of kidney stones   . History of radiation therapy 07/27/18- 09/06/18   Right Breast, Right Axillary and Supraclavicular nodes 50 Gy in 25 fractions, Right Breast Boost 10 Gy in 5 fractions  . HIV (human immunodeficiency virus infection) ([place]) 2009  . HIV INFECTION 03/12/2009  . HSV (herpes simplex virus) infection   . HYPERLIPIDEMIA 08/02/2007  . Hypertension   . Metrorrhagia 03/21/2008  . PONV (postoperative nausea and vomiting)    woke up crying per patient   . RETENTION, URINE 05/19/2009  . Sleep apnea   . Trichomonas infection 01/19/2010  . VITAMIN D DEFICIENCY 01/30/2010   Past Surgical History:  Procedure Laterality Date  . ABDOMINAL HYSTERECTOMY  07/22/2010   TAH WITH PRESERVATION OF BOTH TUBES AND OVARIES  . BREAST LUMPECTOMY Right 2019  . BREAST LUMPECTOMY WITH RADIOACTIVE SEED AND  SENTINEL LYMPH NODE BIOPSY Right 02/23/2018   Procedure: BREAST LUMPECTOMY WITH RADIOACTIVE SEED AND SENTINEL LYMPH NODE BIOPSY;  Surgeon: Stark Klein, MD;  Location: [facility];  Service: General;  Laterality: Right;  . CHOLECYSTECTOMY    . ENDOMETRIAL ABLATION  01/11/2008   HER OPTION  . ESOPHAGOGASTRODUODENOSCOPY    . MULTIPLE TOOTH EXTRACTIONS    . PORT-A-CATH REMOVAL Left 05/08/2019   Procedure: REMOVAL PORT-A-CATH;  Surgeon: Stark Klein, MD;  Location: [facility];  Service: General;  Laterality: Left;  . PORTACATH PLACEMENT Left 02/23/2018   Procedure:  INSERTION PORT-A-CATH;  Surgeon: Stark Klein, MD;  Location: [facility];  Service: General;  Laterality: Left;  . PORTACATH PLACEMENT N/A 04/21/2018   Procedure: PORT-A-CATH REVISION;  Surgeon: Stark Klein, MD;  Location: [facility];  Service: General;  Laterality: N/A;  . TUBAL LIGATION    . WISDOM TOOTH EXTRACTION      reports that she has never smoked. She has never used smokeless tobacco. She reports current alcohol use. She reports that she does not use drugs. family history includes Breast cancer in her paternal aunt, paternal aunt, and paternal aunt; Cancer in her father; Colon cancer in her father, paternal grandfather, and another family member; Diabetes in her father and mother; Heart attack (age of onset: 3) in her maternal grandfather; Heart disease in an other family member; Hypertension in her mother; Lung cancer in her paternal aunt; Lung cancer (age of onset: 41) in her maternal grandmother; Prostate cancer in an other family member; Stroke in her paternal uncle and another family member. Allergies  Allergen Reactions  . Metformin And Related Other (See Comments)    Bowel frequency  . Levaquin [Levofloxacin In D5w] Nausea And Vomiting and Rash  . Penicillins Swelling and Rash    Has patient had a PCN reaction causing immediate rash, facial/tongue/throat swelling, SOB or lightheadedness with hypotension: Yes Has patient had a PCN reaction causing severe rash involving mucus membranes or skin necrosis: No Has patient had a PCN reaction that required hospitalization: Yes Has patient had a PCN reaction occurring within the last 10 years: No If all of the above answers are "NO", then may proceed with Cephalosporin use.   Current Outpatient Medications on File Prior to Visit  Medication Sig Dispense Refill  . albuterol (VENTOLIN HFA) 108 (90 Base) MCG/ACT inhaler Inhale 2 puffs into the lungs every 6 (six) hours as needed for wheezing or shortness of breath. 1 g 11  .  bictegravir-emtricitabine-tenofovir AF (BIKTARVY) 50-200-25 MG TABS tablet Take 1 tablet by mouth daily.    . blood glucose meter kit and supplies Dispense based on patient and insurance preference. Use up to four times daily as directed. (FOR ICD-10 E10.9, E11.9). 1 each 0  . carvedilol (COREG) 6.25 MG tablet Take 1 tablet (6.25 mg total) by mouth 2 (two) times daily. 60 tablet 11  . colchicine 0.6 MG tablet Take 1 tablet (0.6 mg total) by mouth daily. 90 tablet 3  . Diclofenac Sodium (PENNSAID) 2 % SOLN Place 1 application onto the skin 2 (two) times daily. 112 g 2  . Doxepin HCl 3 MG TABS Take 1 tablet (3 mg total) by mouth at bedtime. 30 tablet 3  . Dulaglutide (TRULICITY) 3 HQ/4.6NG SOPN Inject 3 mg into the skin once a week. 6 mL 3  . ferrous gluconate (FERGON) 240 (27 FE) MG tablet Take 240 mg by mouth daily as needed (iron).     . gabapentin (NEURONTIN) 300 MG  capsule Take 1 capsule (300 mg total) by mouth at bedtime. 90 capsule 4  . glipiZIDE (GLUCOTROL XL) 10 MG 24 hr tablet Take 1 tablet (10 mg total) by mouth daily with breakfast. 90 tablet 3  . glucose blood (ONETOUCH VERIO) test strip USE 1 STRIP TO CHECK GLUCOSE 4 TIMES DAILY AS DIRECTED. DX: E11.69 100 each 5  . hydrocortisone (ANUSOL-HC) 2.5 % rectal cream Place 1 application rectally 2 (two) times daily. 30 g 1  . ibuprofen (ADVIL,MOTRIN) 800 MG tablet Take 1 tablet (800 mg total) by mouth every 8 (eight) hours as needed. 60 tablet 0  . Insulin Pen Needle (PEN NEEDLES) 32G X 4 MM MISC Use with insulin pen 100 each 12  . Lancets (ONETOUCH DELICA PLUS [id number]) MISC USE TO TEST BLOOD SUGAR UP TO 4 TIMES DAILY 100 each 5  . loratadine (CLARITIN) 10 MG tablet Take 10 mg by mouth daily.    Marland Kitchen losartan (COZAAR) 25 MG tablet Take 1 tablet (25 mg total) by mouth daily. 30 tablet 11  . Multiple Vitamin (MULTIVITAMIN WITH MINERALS) TABS tablet Take 1 tablet by mouth daily.    . nitroGLYCERIN (NITRODUR - DOSED IN MG/24 HR) 0.2 mg/hr patch  Apply 1/4 patch daily to tendon for tendonitis. 30 patch 1  . nystatin-triamcinolone ointment (MYCOLOG) Apply 1 application topically 2 (two) times daily. 30 g 1  . pioglitazone (ACTOS) 45 MG tablet Take 1 tablet (45 mg total) by mouth daily. 90 tablet 3  . tamoxifen (NOLVADEX) 20 MG tablet Take 1 tablet (20 mg total) by mouth daily. 90 tablet 4  . valACYclovir (VALTREX) 500 MG tablet Take twice daily for 3-5 days 30 tablet 12  . venlafaxine XR (EFFEXOR-XR) 75 MG 24 hr capsule Take 1 capsule (75 mg total) by mouth daily with breakfast. 90 capsule 4  . zolpidem (AMBIEN) 10 MG tablet Take 1 tablet (10 mg total) by mouth at bedtime as needed. for sleep 90 tablet 1   No current facility-administered medications on file prior to visit.        ROS:  All others reviewed and negative.  Objective        PE:  BP 128/80   Pulse 90   Temp 98.6 F (37 C) (Oral)   Ht 5' 2"  (1.575 m)   Wt 272 lb (123.4 kg)   LMP 06/21/2010   SpO2 99%   BMI 49.75 kg/m                 Constitutional: Pt appears in NAD               HENT: Head: NCAT.                Right Ear: External ear normal.                 Left Ear: External ear normal.                Eyes: . Pupils are equal, round, and reactive to light. Conjunctivae and EOM are normal               Nose: without d/c or deformity               Neck: Neck supple. Gross normal ROM               Cardiovascular: Normal rate and regular rhythm.                 Pulmonary/Chest:  Effort normal and breath sounds without rales or wheezing.                Abd:  Soft, NT, ND, + BS, no organomegaly               Neurological: Pt is alert. At baseline orientation, motor grossly intact               Skin: Skin is warm. No rashes, no other new lesions, LE edema - none               Psychiatric: Pt behavior is normal without agitation   Micro: none  Cardiac tracings I have personally interpreted today:  none  Pertinent Radiological findings (summarize): none   Lab  Results  Component Value Date   WBC 3.1 (L) 10/14/2020   HGB 12.6 10/14/2020   HCT 38.8 10/14/2020   PLT 205 10/14/2020   GLUCOSE 277 (H) 10/17/2020   CHOL 207 (H) 10/17/2020   TRIG 152.0 (H) 10/17/2020   HDL 56.70 10/17/2020   LDLDIRECT 143.0 10/03/2018   LDLCALC 120 (H) 10/17/2020   ALT 22 10/14/2020   AST 18 10/14/2020   NA 138 10/17/2020   K 4.1 10/17/2020   CL 98 10/17/2020   CREATININE 0.71 10/17/2020   BUN 14 10/17/2020   CO2 32 10/17/2020   TSH 2.66 10/17/2020   HGBA1C 9.1 (H) 10/17/2020   MICROALBUR <0.7 10/17/2020   Assessment/Plan:  Nieve Rojero is a 50 y.o. Black or African American [2] female with  has a past medical history of ANEMIA-IRON DEFICIENCY (01/30/2010), ANXIETY (11/27/2007), Arthritis, Asthma (02/25/2011), Cancer ([place]), Carbuncle and furuncle of trunk (04/16/2010), Chlamydia infection (03/21/2008), DIABETES MELLITUS, TYPE II (08/02/2007), Edema (01/30/2010), ELEVATED BLOOD PRESSURE WITHOUT DIAGNOSIS OF HYPERTENSION (08/02/2007), Family history of breast cancer, Family history of colon cancer, Family history of lung cancer, FREQUENCY, URINARY (05/19/2009), GENITAL HERPES (03/12/2009), GERD (gastroesophageal reflux disease), History of kidney stones, History of radiation therapy (07/27/18- 09/06/18), HIV (human immunodeficiency virus infection) ([place]) (2009), HIV INFECTION (03/12/2009), HSV (herpes simplex virus) infection, HYPERLIPIDEMIA (08/02/2007), Hypertension, Metrorrhagia (03/21/2008), PONV (postoperative nausea and vomiting), RETENTION, URINE (05/19/2009), Sleep apnea, Trichomonas infection (01/19/2010), and VITAMIN D DEFICIENCY (01/30/2010).  Encounter for well adult exam with abnormal findings Age and sex appropriate education and counseling updated with regular exercise and diet Referrals for preventative services  - pt to call for mammogram Immunizations addressed - none needed Smoking counseling  - none needed Evidence for depression or other mood disorder - none  significant Most recent labs reviewed. I have personally reviewed and have noted: 1) the patient's medical and social history 2) The patient's current medications and supplements 3) The patient's height, weight, and BMI have been recorded in the chart   Hyperlipidemia Lab Results  Component Value Date   LDLCALC 120 (H) 10/17/2020   Stable, pt to continue current statin crestor 20  Hypertension BP Readings from Last 3 Encounters:  10/17/20 128/80  10/14/20 124/75  08/19/20 112/83   Stable, pt to continue medical treatment coreg, cozaar   Type 2 diabetes mellitus with other specified complication ([place]) Lab Results  Component Value Date   HGBA1C 9.1 (H) 10/17/2020   Stable, pt to continue current medical treatment trulicity, glucotrol, actos, glargine   Achilles tendinitis Chronic mild persistent, for salon paz otc topical  Bilateral knee pain Left > right, chronic intermittent, mild, for otc voltaren gel prn  Followup: Return in about 6 months (around 04/19/2021).  Cathlean Cower, MD 10/18/2020  6:33 PM [facility]

## 2020-10-17 NOTE — Patient Instructions (Signed)
Ok to try the [place] for the left achilles as needed  Ok to try the OTC Voltaren gel for the knees OR the achilles as well  Please continue all other medications as before, and refills have been done if requested.  Please have the pharmacy call with any other refills you may need.  Please continue your efforts at being more active, low cholesterol diet, and weight control.  You are otherwise up to date with prevention measures today.  Please keep your appointments with your specialists as you may have planned  Please go to the LAB at the blood drawing area for the tests to be done  You will be contacted by phone if any changes need to be made immediately.  Otherwise, you will receive a letter about your results with an explanation, but please check with MyChart first.  Please remember to sign up for MyChart if you have not done so, as this will be important to you in the future with finding out test results, communicating by private email, and scheduling acute appointments online when needed.  Please make an Appointment to return in 6 months, or sooner if needed

## 2020-10-18 ENCOUNTER — Encounter: Payer: Self-pay | Admitting: Internal Medicine

## 2020-10-18 ENCOUNTER — Other Ambulatory Visit: Payer: Self-pay | Admitting: Internal Medicine

## 2020-10-18 DIAGNOSIS — M25561 Pain in right knee: Secondary | ICD-10-CM | POA: Insufficient documentation

## 2020-10-18 MED ORDER — BASAGLAR KWIKPEN 100 UNIT/ML ~~LOC~~ SOPN: 20.0000 [IU] | PEN_INJECTOR | Freq: Every day | SUBCUTANEOUS | 11 refills | Status: DC

## 2020-10-18 MED ORDER — THERA-D 2000 50 MCG (2000 UT) PO TABS: ORAL_TABLET | ORAL | 99 refills | Status: AC

## 2020-10-18 MED ORDER — ROSUVASTATIN CALCIUM 20 MG PO TABS: 20.0000 mg | ORAL_TABLET | Freq: Every day | ORAL | 3 refills | Status: DC

## 2020-10-18 NOTE — Assessment & Plan Note (Signed)
Left > right, chronic intermittent, mild, for otc voltaren gel prn

## 2020-10-18 NOTE — Assessment & Plan Note (Signed)
BP Readings from Last 3 Encounters:  10/17/20 128/80  10/14/20 124/75  08/19/20 112/83   Stable, pt to continue medical treatment coreg, cozaar

## 2020-10-18 NOTE — Assessment & Plan Note (Signed)
Lab Results  Component Value Date   LDLCALC 120 (H) 10/17/2020   Stable, pt to continue current statin crestor 20

## 2020-10-18 NOTE — Assessment & Plan Note (Signed)
Age and sex appropriate education and counseling updated with regular exercise and diet Referrals for preventative services  - pt to call for mammogram Immunizations addressed - none needed Smoking counseling  - none needed Evidence for depression or other mood disorder - none significant Most recent labs reviewed. I have personally reviewed and have noted: 1) the patient's medical and social history 2) The patient's current medications and supplements 3) The patient's height, weight, and BMI have been recorded in the chart

## 2020-10-18 NOTE — Assessment & Plan Note (Signed)
Lab Results  Component Value Date   HGBA1C 9.1 (H) 10/17/2020   Stable, pt to continue current medical treatment trulicity, glucotrol, actos, glargine

## 2020-10-18 NOTE — Assessment & Plan Note (Signed)
Chronic mild persistent, for salon paz otc topical

## 2021-01-07 ENCOUNTER — Other Ambulatory Visit: Payer: Self-pay | Admitting: Infectious Diseases

## 2021-01-07 DIAGNOSIS — B20 Human immunodeficiency virus [HIV] disease: Secondary | ICD-10-CM

## 2021-01-23 DIAGNOSIS — Z0289 Encounter for other administrative examinations: Secondary | ICD-10-CM

## 2021-03-09 ENCOUNTER — Telehealth: Payer: Self-pay | Admitting: *Deleted

## 2021-03-09 ENCOUNTER — Encounter: Payer: Self-pay | Admitting: Oncology

## 2021-03-09 NOTE — Progress Notes (Signed)
GYNECOLOGY  VISIT   HPI: 50 y.o.   Divorced  [place] American  female   [phone] with Patient's last menstrual period was 06/21/2010.   here for dysuria after using sex toy and some vaginal irritation.    Symptoms started 5 - 6 days ago.   Lower abdomen hurts when she finishes voiding.  No back pain.  No fever.  No nausea.  Right sided external irritation.  Not sure if she has a lump there.   No vaginal discharge or odor.   She has been using Mycolog and Monistat recently, and is it not helping.  No new partners.   Not sure if she is having an outbreak with herpes. Takes Valtrex prn.  States she feels dry.  GYNECOLOGIC HISTORY: Patient's last menstrual period was 06/21/2010. Contraception:  Hyst Menopausal hormone therapy:  None Last mammogram: 06-27-20 Diag.Bil/Hx Rt.Br.Ca. Neg/BiRads2/Bil.Diag.59yrLast pap smear: 06-20-19 Neg, 06-08-17 Neg, 03-31-16 Neg, 03-27-15 Neg        OB History     Gravida  7   Para  5   Term  5   Preterm      AB  2   Living  5      SAB  2   IAB      Ectopic      Multiple      Live Births  5              Patient Active Problem List   Diagnosis Date Noted   Bilateral knee pain 10/18/2020   Leukopenia 08/19/2020   Osteoarthritis of left knee 11/29/2019   Lymphedema of right arm 10/11/2019   Upper respiratory infection 07/02/2019   Type 2 diabetes mellitus with other specified complication ([facility] 012/75/1700  Hematochezia 03/30/2018   Insomnia 03/30/2018   Port-A-Cath in place 03/29/2018   Genetic testing 01/30/2018   Neoplasm of breast, regional lymph node staging category N3b: metastasis in ipsilateral internal mammary lymph node and axillary lymph node ([facility] 01/30/2018   Family history of breast cancer    Family history of colon cancer    Family history of lung cancer    Morbid obesity ([facility] 01/18/2018   Malignant neoplasm of upper-outer quadrant of right breast in female, estrogen receptor positive ([facility] 01/17/2018    Achilles tendinitis 11/28/2017   Hot flashes 09/30/2017   Hypertension 09/08/2017   Contusion of left knee and lower leg 11/18/2015   Mild intermittent asthma 08/15/2013   BV (bacterial vaginosis) 08/11/2012   Right knee pain 07/05/2012   GERD (gastroesophageal reflux disease) 07/05/2012   GC (gonococcus) 05/30/2012   Chlamydia 05/30/2012   Left knee pain 01/12/2012   Encounter for long-term (current) use of high-risk medication 02/25/2011   Encounter for well adult exam with abnormal findings 02/14/2011   VITAMIN D DEFICIENCY 01/30/2010   ANEMIA-IRON DEFICIENCY 01/30/2010   Edema 01/30/2010   HIV (human immunodeficiency virus infection) ([facility] 03/12/2009   GENITAL HERPES 03/12/2009   Anxiety state 11/27/2007   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, without long-term current use of insulin ([facility] 08/02/2007   Hyperlipidemia 08/02/2007    Past Medical History:  Diagnosis Date   ANEMIA-IRON DEFICIENCY 01/30/2010   ANXIETY 11/27/2007   Arthritis    Asthma 02/25/2011   Cancer ([facility]    breast   Carbuncle and furuncle of trunk 04/16/2010   Chlamydia infection 03/21/2008   DIABETES MELLITUS, TYPE II 08/02/2007   Edema 01/30/2010  ELEVATED BLOOD PRESSURE WITHOUT DIAGNOSIS OF HYPERTENSION 08/02/2007   Family history of breast cancer    Family history of colon cancer    Family history of lung cancer    FREQUENCY, URINARY 05/19/2009   GENITAL HERPES 03/12/2009   GERD (gastroesophageal reflux disease)    History of kidney stones    History of radiation therapy 07/27/18- 09/06/18   Right Breast, Right Axillary and Supraclavicular nodes 50 Gy in 25 fractions, Right Breast Boost 10 Gy in 5 fractions   HIV (human immunodeficiency virus infection) ([place]) 2009   HIV INFECTION 03/12/2009   HSV (herpes simplex virus) infection    HYPERLIPIDEMIA 08/02/2007   Hypertension    Metrorrhagia 03/21/2008   PONV (postoperative nausea and  vomiting)    woke up crying per patient    RETENTION, URINE 05/19/2009   Sleep apnea    Trichomonas infection 01/19/2010   VITAMIN D DEFICIENCY 01/30/2010    Past Surgical History:  Procedure Laterality Date   ABDOMINAL HYSTERECTOMY  07/22/2010   TAH WITH PRESERVATION OF BOTH TUBES AND OVARIES   BREAST LUMPECTOMY Right 2019   BREAST LUMPECTOMY WITH RADIOACTIVE SEED AND SENTINEL LYMPH NODE BIOPSY Right 02/23/2018   Procedure: BREAST LUMPECTOMY WITH RADIOACTIVE SEED AND SENTINEL LYMPH NODE BIOPSY;  Surgeon: Stark Klein, MD;  Location: [facility];  Service: General;  Laterality: Right;   CHOLECYSTECTOMY     ENDOMETRIAL ABLATION  01/11/2008   HER OPTION   ESOPHAGOGASTRODUODENOSCOPY     MULTIPLE TOOTH EXTRACTIONS     PORT-A-CATH REMOVAL Left 05/08/2019   Procedure: REMOVAL PORT-A-CATH;  Surgeon: Stark Klein, MD;  Location: [facility];  Service: General;  Laterality: Left;   PORTACATH PLACEMENT Left 02/23/2018   Procedure: INSERTION PORT-A-CATH;  Surgeon: Stark Klein, MD;  Location: [facility];  Service: General;  Laterality: Left;   PORTACATH PLACEMENT N/A 04/21/2018   Procedure: PORT-A-CATH REVISION;  Surgeon: Stark Klein, MD;  Location: [facility];  Service: General;  Laterality: N/A;   TUBAL LIGATION     WISDOM TOOTH EXTRACTION      Current Outpatient Medications  Medication Sig Dispense Refill   albuterol (VENTOLIN HFA) 108 (90 Base) MCG/ACT inhaler Inhale 2 puffs into the lungs every 6 (six) hours as needed for wheezing or shortness of breath. 1 g 11   bictegravir-emtricitabine-tenofovir AF (BIKTARVY) 50-200-25 MG TABS tablet Take 1 tablet by mouth daily.     blood glucose meter kit and supplies Dispense based on patient and insurance preference. Use up to four times daily as directed. (FOR ICD-10 E10.9, E11.9). 1 each 0   carvedilol (COREG) 6.25 MG tablet Take 1 tablet (6.25 mg total) by mouth 2 (two) times daily. 60 tablet 11   Cholecalciferol (THERA-D 2000) 50 MCG (2000 UT)  TABS 1 tab by mouth once daily 30 tablet 99   colchicine 0.6 MG tablet Take 1 tablet (0.6 mg total) by mouth daily. 90 tablet 3   Diclofenac Sodium (PENNSAID) 2 % SOLN Place 1 application onto the skin 2 (two) times daily. 112 g 2   Doxepin HCl 3 MG TABS Take 1 tablet (3 mg total) by mouth at bedtime. 30 tablet 3   Dulaglutide (TRULICITY) 3 NO/7.0JG SOPN Inject 3 mg into the skin once a week. 6 mL 3   ferrous gluconate (FERGON) 240 (27 FE) MG tablet Take 240 mg by mouth daily as needed (iron).      gabapentin (NEURONTIN) 300 MG capsule Take 1 capsule (300 mg total) by mouth at bedtime.  90 capsule 4   glipiZIDE (GLUCOTROL XL) 10 MG 24 hr tablet Take 1 tablet (10 mg total) by mouth daily with breakfast. 90 tablet 3   glucose blood (ONETOUCH VERIO) test strip USE 1 STRIP TO CHECK GLUCOSE 4 TIMES DAILY AS DIRECTED. DX: E11.69 100 each 5   hydrocortisone (ANUSOL-HC) 2.5 % rectal cream Place 1 application rectally 2 (two) times daily. 30 g 1   ibuprofen (ADVIL,MOTRIN) 800 MG tablet Take 1 tablet (800 mg total) by mouth every 8 (eight) hours as needed. 60 tablet 0   Insulin Glargine (BASAGLAR KWIKPEN) 100 UNIT/ML Inject 20 Units into the skin daily. 15 mL 11   Insulin Pen Needle (PEN NEEDLES) 32G X 4 MM MISC Use with insulin pen 100 each 12   Lancets (ONETOUCH DELICA PLUS DXIPJA25K) MISC USE TO TEST BLOOD SUGAR UP TO 4 TIMES DAILY 100 each 5   loratadine (CLARITIN) 10 MG tablet Take 10 mg by mouth daily.     losartan (COZAAR) 25 MG tablet Take 1 tablet (25 mg total) by mouth daily. 30 tablet 11   Multiple Vitamin (MULTIVITAMIN WITH MINERALS) TABS tablet Take 1 tablet by mouth daily.     nitroGLYCERIN (NITRODUR - DOSED IN MG/24 HR) 0.2 mg/hr patch Apply 1/4 patch daily to tendon for tendonitis. 30 patch 1   nystatin-triamcinolone ointment (MYCOLOG) Apply 1 application topically 2 (two) times daily. 30 g 1   pioglitazone (ACTOS) 45 MG tablet Take 1 tablet (45 mg total) by mouth daily. 90 tablet 3    rosuvastatin (CRESTOR) 10 MG tablet Take 10 mg by mouth at bedtime.     tamoxifen (NOLVADEX) 20 MG tablet Take 1 tablet (20 mg total) by mouth daily. 90 tablet 4   valACYclovir (VALTREX) 500 MG tablet Take twice daily for 3-5 days 30 tablet 12   venlafaxine XR (EFFEXOR-XR) 75 MG 24 hr capsule Take 1 capsule (75 mg total) by mouth daily with breakfast. 90 capsule 4   zolpidem (AMBIEN) 10 MG tablet Take 1 tablet (10 mg total) by mouth at bedtime as needed. for sleep 90 tablet 1   triazolam (HALCION) 0.25 MG tablet TAKE ONE TABLET BY MOUTH 1 HOUR BEFORE DENTAL PROCEDURE 1 tablet 0   No current facility-administered medications for this visit.     ALLERGIES: Metformin and related, Levaquin [levofloxacin in d5w], and Penicillins  Family History  Problem Relation Age of Onset   Prostate cancer Other    Heart disease Other    Stroke Other    Diabetes Mother    Hypertension Mother    Diabetes Father    Cancer Father        COLON and LU NG   Colon cancer Father    Stroke Paternal Uncle    Breast cancer Paternal Aunt    Lung cancer Maternal Grandmother 45       Mesothelioma   Colon cancer Paternal Grandfather        dx over 70s   Lung cancer Paternal 65    Breast cancer Paternal Aunt    Breast cancer Paternal Aunt    Heart attack Maternal Grandfather 71   Colon cancer Other        MGM's 5 brothers    Social History   Socioeconomic History   Marital status: Divorced    Spouse name: Not on file   Number of children: Not on file   Years of education: Not on file   Highest education level: Not on file  Occupational  History   Not on file  Tobacco Use   Smoking status: Never   Smokeless tobacco: Never  Vaping Use   Vaping Use: Never used  Substance and Sexual Activity   Alcohol use: Yes    Alcohol/week: 0.0 standard drinks    Comment: occ   Drug use: No   Sexual activity: Not on file  Other Topics Concern   Not on file  Social History Narrative   Not on file   Social  Determinants of Health   Financial Resource Strain: Not on file  Food Insecurity: Not on file  Transportation Needs: Not on file  Physical Activity: Not on file  Stress: Not on file  Social Connections: Not on file  Intimate Partner Violence: Not on file    Review of Systems  Genitourinary:  Positive for dysuria.       Vaginal irritation  All other systems reviewed and are negative.  PHYSICAL EXAMINATION:    BP 122/84 (Cuff Size: Large)   Pulse 97   Ht 5' 1"  (1.549 m)   Wt 260 lb (117.9 kg)   LMP 06/21/2010   SpO2 99%   BMI 49.13 kg/m     General appearance: alert, cooperative and appears stated age  Pelvic: External genitalia:  no lesions              Urethra:  normal appearing urethra with no masses, tenderness or lesions              Bartholins and Skenes: normal                 Vagina: normal appearing vagina with normal color and discharge, no lesions              Cervix: absent                Bimanual Exam:  Uterus:  absent              Adnexa: no mass, fullness, tenderness              Chaperone was present for exam:  Estill Bamberg, CMA.  ASSESSMENT  Dysuria.  Vaginal irritation.  Atrophy.  Status post TAH.  Ovaries remain.  HIV.  HSV.  DM. History of breast cancer.   PLAN  Urinalysis:  sg 1.025, pH 5.5, 10 - 20 WBC, NS RBC, 6 - 10 squams, moderate bacteria, moderate mucous. UC sent.  Bactrim DS po bid x 3 days. Pyridium 200 mg po tid prn.   Wet prep:  no yeast, clue cells, or trichomonas. She is requesting Diflucan in case she develops a yeast infection with the use of abx.  Refill of Valtrex 500 mg po bid x 3 - 5 days prn. We discussed water based lubricants and cooking oils for hydration.  FU prn.   An After Visit Summary was printed and given to the patient.   33 min   total time was spent for this patient encounter, including preparation, face-to-face counseling with the patient, coordination of care, and documentation of the encounter.

## 2021-03-09 NOTE — Telephone Encounter (Signed)
Because the symptoms started right after using the toy it is likely trauma from use such as friction or tearing. If she is not experiencing any other symptoms I recommend monitoring and being gentle with wiping and washing by using a patting motion. She can apply an OTC lidocaine ointment for pain if it is that severe. If course if symptoms do not improve she will need to be seen.

## 2021-03-09 NOTE — Telephone Encounter (Signed)
Patient states she used a sex toy for pleasure. After peeing patient has vaginal pain severity 7,no bleeding, or discharge Patient would like to know if she needs to be seen or what advice we can give her to relive pain.I will route this to Marny Lowenstein NP for recommendations/advice.

## 2021-03-09 NOTE — Telephone Encounter (Signed)
Patient states she feels as if she needs to make an appointment.[facility] appointment pool aware

## 2021-03-10 ENCOUNTER — Other Ambulatory Visit: Payer: Self-pay

## 2021-03-10 ENCOUNTER — Encounter: Payer: Self-pay | Admitting: Obstetrics and Gynecology

## 2021-03-10 ENCOUNTER — Ambulatory Visit: Payer: BC Managed Care – PPO | Admitting: Obstetrics and Gynecology

## 2021-03-10 VITALS — BP 122/84 | HR 97 | Ht 61.0 in | Wt 260.0 lb

## 2021-03-10 DIAGNOSIS — R300 Dysuria: Secondary | ICD-10-CM | POA: Diagnosis not present

## 2021-03-10 DIAGNOSIS — N898 Other specified noninflammatory disorders of vagina: Secondary | ICD-10-CM | POA: Diagnosis not present

## 2021-03-10 DIAGNOSIS — N952 Postmenopausal atrophic vaginitis: Secondary | ICD-10-CM

## 2021-03-10 LAB — WET PREP FOR TRICH, YEAST, CLUE

## 2021-03-10 MED ORDER — VALACYCLOVIR HCL 500 MG PO TABS: ORAL_TABLET | ORAL | 2 refills | Status: AC

## 2021-03-10 MED ORDER — PHENAZOPYRIDINE HCL 200 MG PO TABS: 200.0000 mg | ORAL_TABLET | Freq: Three times a day (TID) | ORAL | 0 refills | Status: DC | PRN

## 2021-03-10 MED ORDER — FLUCONAZOLE 150 MG PO TABS: 150.0000 mg | ORAL_TABLET | Freq: Once | ORAL | 0 refills | Status: AC

## 2021-03-10 MED ORDER — SULFAMETHOXAZOLE-TRIMETHOPRIM 800-160 MG PO TABS: 1.0000 | ORAL_TABLET | Freq: Two times a day (BID) | ORAL | 0 refills | Status: DC

## 2021-03-10 NOTE — Patient Instructions (Signed)

## 2021-03-12 LAB — URINE CULTURE
MICRO NUMBER:: 12248934
SPECIMEN QUALITY:: ADEQUATE

## 2021-03-12 LAB — URINALYSIS, COMPLETE W/RFL CULTURE
Bilirubin Urine: NEGATIVE
Glucose, UA: NEGATIVE
Hgb urine dipstick: NEGATIVE
Hyaline Cast: NONE SEEN /LPF
Ketones, ur: NEGATIVE
Nitrites, Initial: NEGATIVE
Protein, ur: NEGATIVE
RBC / HPF: NONE SEEN /HPF (ref 0–2)
Specific Gravity, Urine: 1.025 (ref 1.001–1.035)
pH: 5.5 (ref 5.0–8.0)

## 2021-03-12 LAB — CULTURE INDICATED

## 2021-04-09 ENCOUNTER — Other Ambulatory Visit: Payer: Self-pay | Admitting: Obstetrics and Gynecology

## 2021-04-09 NOTE — Telephone Encounter (Signed)
Patient called stating she has yeast infection, itching and irritation. Reports she is diabetic and has recurrent yeast at times. She did completed the #2 tablet in August when prescribed UTI antibiotic. Please advise

## 2021-04-09 NOTE — Telephone Encounter (Signed)
Patient needs office visit for evaluation for prescription medication.  She may chose to try Monistat over the counter anti yeast medication.

## 2021-04-10 NOTE — Telephone Encounter (Signed)
Patient informed,

## 2021-04-14 ENCOUNTER — Ambulatory Visit: Payer: BC Managed Care – PPO | Admitting: Nurse Practitioner

## 2021-04-14 ENCOUNTER — Other Ambulatory Visit: Payer: Self-pay

## 2021-04-14 ENCOUNTER — Encounter: Payer: Self-pay | Admitting: Nurse Practitioner

## 2021-04-14 VITALS — BP 124/82

## 2021-04-14 DIAGNOSIS — N898 Other specified noninflammatory disorders of vagina: Secondary | ICD-10-CM | POA: Diagnosis not present

## 2021-04-14 LAB — WET PREP FOR TRICH, YEAST, CLUE

## 2021-04-14 MED ORDER — FLUCONAZOLE 150 MG PO TABS: 150.0000 mg | ORAL_TABLET | ORAL | 0 refills | Status: DC | PRN

## 2021-04-14 NOTE — Progress Notes (Signed)
   Acute Office Visit  Subjective:    Patient ID: Kelly Rowe, female    DOB: [birth year], 50 y.o.   MRN: [id number]   HPI 51 y.o. presents today for vaginal itching, irritation, and discharge that started 1 week ago. She used Monistat-1 two days ago and symptoms have improved. She has a history of recurrent yeast infections and was prescribed Diflucan as needed. She is requesting this. She uses OTC treatments but they are costly.    Review of Systems  Constitutional: Negative.   Genitourinary:  Positive for vaginal discharge and vaginal pain (itching and irritation).      Objective:    Physical Exam Constitutional:      Appearance: Normal appearance.  Genitourinary:    General: Normal vulva.     Vagina: Normal.    BP 124/82   LMP 06/21/2010  Wt Readings from Last 3 Encounters:  03/10/21 260 lb (117.9 kg)  10/17/20 272 lb (123.4 kg)  10/14/20 271 lb 1.6 oz (123 kg)   Wet prep negative     Assessment & Plan:   Problem List Items Addressed This Visit   None Visit Diagnoses     Vaginal irritation    -  Primary   Relevant Medications   fluconazole (DIFLUCAN) 150 MG tablet   Other Relevant Orders   WET PREP FOR TRICH, YEAST, CLUE      Plan: Negative wet prep today. Will provide her with Diflucan #5 for future infections. She is aware to only take if experiencing symptoms. She is agreeable to plan.      [place], 11:52 AM 04/14/2021

## 2021-04-22 ENCOUNTER — Other Ambulatory Visit: Payer: Self-pay

## 2021-04-22 ENCOUNTER — Encounter: Payer: Self-pay | Admitting: Nurse Practitioner

## 2021-04-22 ENCOUNTER — Ambulatory Visit (INDEPENDENT_AMBULATORY_CARE_PROVIDER_SITE_OTHER): Payer: BC Managed Care – PPO | Admitting: Nurse Practitioner

## 2021-04-22 VITALS — BP 138/84 | Ht 62.0 in | Wt 255.0 lb

## 2021-04-22 DIAGNOSIS — Z853 Personal history of malignant neoplasm of breast: Secondary | ICD-10-CM | POA: Diagnosis not present

## 2021-04-22 DIAGNOSIS — Z01419 Encounter for gynecological examination (general) (routine) without abnormal findings: Secondary | ICD-10-CM | POA: Diagnosis not present

## 2021-04-22 DIAGNOSIS — Z780 Asymptomatic menopausal state: Secondary | ICD-10-CM | POA: Diagnosis not present

## 2021-04-22 NOTE — Progress Notes (Signed)
   Kelly Rowe Sep 18, 1970 [id number]   History:  50 y.o. [id number] presents for annual exam. 2011 TAH for fibroids. Normal pap history. 2019 right triple positive breast cancer managed with lumpectomy, chemo, radiation, and antiestrogen therapy (Tamoxifen 05/2019). History of HIV ([facility], undetectable viral load), HTN, HLD, T2DM managed by PCP.   Gynecologic History Patient's last menstrual period was 06/21/2010.   Contraception/Family planning: post menopausal status  Health Maintenance Last Pap: 06/20/2019. Results were: Normal Last mammogram: 06/27/2020. Results were: Normal Last colonoscopy: 06/14/2019. Results were: Benign polyp Last Dexa: Not indicated  Past medical history, past surgical history, family history and social history were all reviewed and documented in the EPIC chart. Divorced. 5 children ages 52-36. 4 grandchildren.   ROS:  A ROS was performed and pertinent positives and negatives are included.  Exam:  Vitals:   04/22/21 1156  BP: 138/84  Weight: 255 lb (115.7 kg)  Height: 5\' 2"  (1.575 m)   Body mass index is 46.64 kg/m.  General appearance:  Normal Thyroid:  Symmetrical, normal in size, without palpable masses or nodularity. Respiratory  Auscultation:  Clear without wheezing or rhonchi Cardiovascular  Auscultation:  Regular rate, without rubs, murmurs or gallops  Edema/varicosities:  Not grossly evident Abdominal  Soft,nontender, without masses, guarding or rebound.  Liver/spleen:  No organomegaly noted  Hernia:  None appreciated  Skin  Inspection:  Grossly normal Breasts: Examined lying and sitting.   Right: Without masses, retractions, nipple discharge or axillary adenopathy. Lymphedema present.  Left: Without masses, retractions, nipple discharge or axillary adenopathy. Genitourinary   Inguinal/mons:  Normal without inguinal adenopathy  External genitalia:  Normal appearing vulva with no masses, tenderness, or  lesions  BUS/Urethra/Skene's glands:  Normal  Vagina:  Normal appearing with normal color and discharge, no lesions  Cervix:  Absent  Uterus:  Absent  Adnexa/parametria:     Rt: Normal in size, without masses or tenderness.   Lt: Normal in size, without masses or tenderness.  Anus and perineum: Normal  Digital rectal exam: Normal sphincter tone without palpated masses or tenderness  Patient informed chaperone available to be present for breast and pelvic exam. Patient has requested no chaperone to be present. Patient has been advised what will be completed during breast and pelvic exam.   Assessment/Plan:  50 y.o. [id number] for annual exam.   Well female exam with routine gynecological exam - Education provided on SBEs, importance of preventative screenings, current guidelines, high calcium diet, regular exercise, and multivitamin daily.  Labs with PCP.   History of right breast cancer - 2019 right triple positive breast cancer managed with lumpectomy, chemo, radiation, and antiestrogen therapy (Tamoxifen 05/2019). Being followed by oncology. Continue annual screenings.   Postmenopausal - no HRT  Screening for cervical cancer - Normal Pap history. Will perform vaginal pap at 3-year interval due to HIV status.   Screening for colon cancer - 2020 colonoscopy. Will repeat at GI's recommended interval.   Return in 1 year for annual.   Tamela Gammon DNP, 12:15 PM 04/22/2021

## 2021-05-18 ENCOUNTER — Telehealth: Payer: Self-pay | Admitting: *Deleted

## 2021-05-18 NOTE — Telephone Encounter (Signed)
Kelly Rowe [phone]) voicemail request to "speak with someone about disability form".    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  I am on disability.  Going out of town and my knees won't hold up."      Apologized for any inconvenience with no receipt of form.  Advised of office access of DMV Handicap forms.  Collaborative will prepare for pick-up tomorrow.  No further questions or needs beyond pick-up time.  Asked to allow through tomorrow afternoon about 1:00 pm to pick up form at office entry registration desk.

## 2021-06-30 ENCOUNTER — Ambulatory Visit
Admission: RE | Admit: 2021-06-30 | Discharge: 2021-06-30 | Disposition: A | Discharge status: Home / Self Care | Payer: BC Managed Care – PPO | Source: Ambulatory Visit | Attending: Oncology | Admitting: Oncology

## 2021-06-30 ENCOUNTER — Other Ambulatory Visit: Payer: Self-pay | Admitting: Oncology

## 2021-06-30 ENCOUNTER — Other Ambulatory Visit: Payer: Self-pay

## 2021-06-30 DIAGNOSIS — D708 Other neutropenia: Secondary | ICD-10-CM

## 2021-06-30 DIAGNOSIS — Z170 Estrogen receptor positive status [ER+]: Secondary | ICD-10-CM

## 2021-06-30 DIAGNOSIS — F5101 Primary insomnia: Secondary | ICD-10-CM

## 2021-06-30 DIAGNOSIS — D508 Other iron deficiency anemias: Secondary | ICD-10-CM

## 2021-06-30 DIAGNOSIS — C50411 Malignant neoplasm of upper-outer quadrant of right female breast: Secondary | ICD-10-CM

## 2021-07-06 ENCOUNTER — Ambulatory Visit: Payer: BC Managed Care – PPO | Admitting: Adult Health

## 2021-07-06 ENCOUNTER — Other Ambulatory Visit: Payer: BC Managed Care – PPO

## 2021-07-13 ENCOUNTER — Inpatient Hospital Stay: Payer: Medicare Other | Attending: Adult Health

## 2021-07-13 ENCOUNTER — Encounter: Payer: Self-pay | Admitting: Oncology

## 2021-07-13 ENCOUNTER — Encounter: Payer: Self-pay | Admitting: Adult Health

## 2021-07-13 ENCOUNTER — Other Ambulatory Visit: Payer: Self-pay

## 2021-07-13 ENCOUNTER — Inpatient Hospital Stay (HOSPITAL_BASED_OUTPATIENT_CLINIC_OR_DEPARTMENT_OTHER): Payer: Medicare Other | Admitting: Adult Health

## 2021-07-13 VITALS — BP 136/89 | HR 86 | Temp 97.5°F | Resp 18 | Wt 251.6 lb

## 2021-07-13 DIAGNOSIS — C50911 Malignant neoplasm of unspecified site of right female breast: Secondary | ICD-10-CM

## 2021-07-13 DIAGNOSIS — Z8249 Family history of ischemic heart disease and other diseases of the circulatory system: Secondary | ICD-10-CM | POA: Insufficient documentation

## 2021-07-13 DIAGNOSIS — Z87442 Personal history of urinary calculi: Secondary | ICD-10-CM | POA: Diagnosis not present

## 2021-07-13 DIAGNOSIS — E1169 Type 2 diabetes mellitus with other specified complication: Secondary | ICD-10-CM

## 2021-07-13 DIAGNOSIS — Z800 Family history of malignant neoplasm of digestive organs: Secondary | ICD-10-CM | POA: Insufficient documentation

## 2021-07-13 DIAGNOSIS — Z923 Personal history of irradiation: Secondary | ICD-10-CM | POA: Diagnosis not present

## 2021-07-13 DIAGNOSIS — C773 Secondary and unspecified malignant neoplasm of axilla and upper limb lymph nodes: Secondary | ICD-10-CM

## 2021-07-13 DIAGNOSIS — Z801 Family history of malignant neoplasm of trachea, bronchus and lung: Secondary | ICD-10-CM | POA: Insufficient documentation

## 2021-07-13 DIAGNOSIS — Z79899 Other long term (current) drug therapy: Secondary | ICD-10-CM | POA: Insufficient documentation

## 2021-07-13 DIAGNOSIS — Z881 Allergy status to other antibiotic agents status: Secondary | ICD-10-CM | POA: Diagnosis not present

## 2021-07-13 DIAGNOSIS — D696 Thrombocytopenia, unspecified: Secondary | ICD-10-CM | POA: Insufficient documentation

## 2021-07-13 DIAGNOSIS — C50411 Malignant neoplasm of upper-outer quadrant of right female breast: Secondary | ICD-10-CM | POA: Insufficient documentation

## 2021-07-13 DIAGNOSIS — Z8042 Family history of malignant neoplasm of prostate: Secondary | ICD-10-CM | POA: Diagnosis not present

## 2021-07-13 DIAGNOSIS — I10 Essential (primary) hypertension: Secondary | ICD-10-CM | POA: Diagnosis not present

## 2021-07-13 DIAGNOSIS — E785 Hyperlipidemia, unspecified: Secondary | ICD-10-CM | POA: Insufficient documentation

## 2021-07-13 DIAGNOSIS — Z880 Allergy status to penicillin: Secondary | ICD-10-CM | POA: Diagnosis not present

## 2021-07-13 DIAGNOSIS — Z823 Family history of stroke: Secondary | ICD-10-CM | POA: Diagnosis not present

## 2021-07-13 DIAGNOSIS — Z833 Family history of diabetes mellitus: Secondary | ICD-10-CM | POA: Diagnosis not present

## 2021-07-13 DIAGNOSIS — Z803 Family history of malignant neoplasm of breast: Secondary | ICD-10-CM | POA: Insufficient documentation

## 2021-07-13 DIAGNOSIS — Z170 Estrogen receptor positive status [ER+]: Secondary | ICD-10-CM | POA: Diagnosis not present

## 2021-07-13 DIAGNOSIS — E1165 Type 2 diabetes mellitus with hyperglycemia: Secondary | ICD-10-CM | POA: Diagnosis not present

## 2021-07-13 DIAGNOSIS — Z9049 Acquired absence of other specified parts of digestive tract: Secondary | ICD-10-CM | POA: Diagnosis not present

## 2021-07-13 DIAGNOSIS — Z79810 Long term (current) use of selective estrogen receptor modulators (SERMs): Secondary | ICD-10-CM | POA: Insufficient documentation

## 2021-07-13 LAB — CBC WITH DIFFERENTIAL/PLATELET
Abs Immature Granulocytes: 0.02 10*3/uL (ref 0.00–0.07)
Basophils Absolute: 0 10*3/uL (ref 0.0–0.1)
Basophils Relative: 1 %
Eosinophils Absolute: 0 10*3/uL (ref 0.0–0.5)
Eosinophils Relative: 1 %
HCT: 41.2 % (ref 36.0–46.0)
Hemoglobin: 13.1 g/dL (ref 12.0–15.0)
Immature Granulocytes: 1 %
Lymphocytes Relative: 44 %
Lymphs Abs: 1.6 10*3/uL (ref 0.7–4.0)
MCH: 28 pg (ref 26.0–34.0)
MCHC: 31.8 g/dL (ref 30.0–36.0)
MCV: 88 fL (ref 80.0–100.0)
Monocytes Absolute: 0.3 10*3/uL (ref 0.1–1.0)
Monocytes Relative: 8 %
Neutro Abs: 1.6 10*3/uL — ABNORMAL LOW (ref 1.7–7.7)
Neutrophils Relative %: 45 %
Platelets: 207 10*3/uL (ref 150–400)
RBC: 4.68 MIL/uL (ref 3.87–5.11)
RDW: 13.3 % (ref 11.5–15.5)
WBC: 3.5 10*3/uL — ABNORMAL LOW (ref 4.0–10.5)
nRBC: 0 % (ref 0.0–0.2)

## 2021-07-13 LAB — COMPREHENSIVE METABOLIC PANEL
ALT: 17 U/L (ref 0–44)
AST: 20 U/L (ref 15–41)
Albumin: 3.4 g/dL — ABNORMAL LOW (ref 3.5–5.0)
Alkaline Phosphatase: 95 U/L (ref 38–126)
Anion gap: 10 (ref 5–15)
BUN: 10 mg/dL (ref 6–20)
CO2: 27 mmol/L (ref 22–32)
Calcium: 8.6 mg/dL — ABNORMAL LOW (ref 8.9–10.3)
Chloride: 102 mmol/L (ref 98–111)
Creatinine, Ser: 0.9 mg/dL (ref 0.44–1.00)
GFR, Estimated: >60 mL/min (ref >60)
Glucose, Bld: 189 mg/dL — ABNORMAL HIGH (ref 70–99)
Potassium: 3.8 mmol/L (ref 3.5–5.1)
Sodium: 139 mmol/L (ref 135–145)
Total Bilirubin: 0.4 mg/dL (ref 0.3–1.2)
Total Protein: 6.9 g/dL (ref 6.5–8.1)

## 2021-07-13 LAB — HEMOGLOBIN A1C
Hgb A1c MFr Bld: 11.2 % — ABNORMAL HIGH (ref 4.8–5.6)
Mean Plasma Glucose: 274.74 mg/dL

## 2021-07-13 NOTE — Progress Notes (Signed)
[place]  Telephone:([phone] Fax:([phone]) [phone]    ID: Kelly Rowe DOB: [birth year]  MR#: [id number]  BXI#:[id number]  Patient Care Team: Biagio Borg, MD as PCP - [name], MD as Consulting Physician (General Surgery) [name], MD as Attending Physician (Radiation Oncology) Marlinda Mike, PA-C as Referring Physician (Physician Assistant) Gregor Hams, MD as Consulting Physician (Family Medicine) OTHER MD:   CHIEF COMPLAINT: Triple positive breast cancer  CURRENT TREATMENT:  tamoxifen   INTERVAL HISTORY: Kelly Rowe returns today for follow-up of her triple positive breast cancer.  She continues on Tamoxifen with good tolerance.  She was having increased blood sugars and had a significant yeast infection.  Since controlling her blood sugars more, this has improved.  She has not undergone A1C testing in over 6 months time.    Her most recent at [facility] on 07/01/2021 showed: breast density category A; no evidence of malignancy in either breast.    REVIEW OF SYSTEMS: Review of Systems  Constitutional:  Negative for appetite change, chills, fatigue, fever and unexpected weight change.  HENT:   Negative for hearing loss, lump/mass and trouble swallowing.   Eyes:  Negative for eye problems and icterus.  Respiratory:  Negative for chest tightness, cough and shortness of breath.   Cardiovascular:  Negative for chest pain, leg swelling and palpitations.  Gastrointestinal:  Negative for abdominal distention, abdominal pain, constipation, diarrhea, nausea and vomiting.  Endocrine: Negative for hot flashes.  Genitourinary:  Negative for difficulty urinating.   Musculoskeletal:  Negative for arthralgias.  Skin:  Negative for itching and rash.  Neurological:  Negative for dizziness, extremity weakness, headaches and numbness.  Hematological:  Negative for adenopathy. Does not bruise/bleed easily.  Psychiatric/Behavioral:  Negative  for depression. The patient is not nervous/anxious.      COVID 19 VACCINATION STATUS: Refuses vaccination, had COVID December 2021   HISTORY OF CURRENT ILLNESS: From the original intake note:  "Kelly Rowe" noticed bruising in her lateral right breast and palpated a mass  on 12/23/2017. She followed up with her gynecologist. She underwent unilateral right diagnostic mammography with tomography and right breast ultrasonography at [facility] on 01/05/2018 showing: breast density category B. There is an irregular highly suspicious mass within the right breast at the 9:30 o'clock upper outer quadrant, measuring 1.8 x 1.1 x 1.5 cm, and located 18 cm from the nipple,  Ultrasonography revealed a single morphologically abnormal lymph node in the RIGHT axilla, with cortical thickness of 6 mm.   Accordingly on 01/11/2018 she proceeded to biopsy of the right breast area in question as well as a suspicious lymph node. The pathology from this procedure showed ([id number]): Invasive ductal carcinoma grade III.  The lymph node biopsied was negative for carcinoma (concordant).. Prognostic indicators significant for: estrogen receptor, 60% positive with weak staining intensity and progesterone receptor, 10% positive with strong staining intensity. Proliferation marker Ki67 at 30%. HER2 amplified with ratios HER2/CEP17 signals 2.26 and average HER2 copies per cell 3.50  The patient's subsequent history is as detailed below.   PAST MEDICAL HISTORY: Past Medical History:  Diagnosis Date   ANEMIA-IRON DEFICIENCY 01/30/2010   ANXIETY 11/27/2007   Arthritis    Asthma 02/25/2011   Cancer ([place])    breast   Carbuncle and furuncle of trunk 04/16/2010   Chlamydia infection 03/21/2008   DIABETES MELLITUS, TYPE II 08/02/2007   Edema 01/30/2010   ELEVATED BLOOD PRESSURE WITHOUT DIAGNOSIS OF HYPERTENSION 08/02/2007   Family history of  breast cancer    Family history of colon cancer    Family history of lung cancer     FREQUENCY, URINARY 05/19/2009   GENITAL HERPES 03/12/2009   GERD (gastroesophageal reflux disease)    History of kidney stones    History of radiation therapy 07/27/18- 09/06/18   Right Breast, Right Axillary and Supraclavicular nodes 50 Gy in 25 fractions, Right Breast Boost 10 Gy in 5 fractions   HIV (human immunodeficiency virus infection) ([place]) 2009   HIV INFECTION 03/12/2009   HSV (herpes simplex virus) infection    HYPERLIPIDEMIA 08/02/2007   Hypertension    Metrorrhagia 03/21/2008   PONV (postoperative nausea and vomiting)    woke up crying per patient    RETENTION, URINE 05/19/2009   Sleep apnea    Trichomonas infection 01/19/2010   VITAMIN D DEFICIENCY 01/30/2010    PAST SURGICAL HISTORY: Past Surgical History:  Procedure Laterality Date   ABDOMINAL HYSTERECTOMY  07/22/2010   TAH WITH PRESERVATION OF BOTH TUBES AND OVARIES   BREAST LUMPECTOMY Right 2019   BREAST LUMPECTOMY WITH RADIOACTIVE SEED AND SENTINEL LYMPH NODE BIOPSY Right 02/23/2018   Procedure: BREAST LUMPECTOMY WITH RADIOACTIVE SEED AND SENTINEL LYMPH NODE BIOPSY;  Surgeon: Stark Klein, MD;  Location: [name];  Service: General;  Laterality: Right;   CHOLECYSTECTOMY     ENDOMETRIAL ABLATION  01/11/2008   HER OPTION   ESOPHAGOGASTRODUODENOSCOPY     MULTIPLE TOOTH EXTRACTIONS     PORT-A-CATH REMOVAL Left 05/08/2019   Procedure: REMOVAL PORT-A-CATH;  Surgeon: Stark Klein, MD;  Location: [facility];  Service: General;  Laterality: Left;   PORTACATH PLACEMENT Left 02/23/2018   Procedure: INSERTION PORT-A-CATH;  Surgeon: Stark Klein, MD;  Location: [facility];  Service: General;  Laterality: Left;   PORTACATH PLACEMENT N/A 04/21/2018   Procedure: PORT-A-CATH REVISION;  Surgeon: Stark Klein, MD;  Location: [facility];  Service: General;  Laterality: N/A;   TUBAL LIGATION     WISDOM TOOTH EXTRACTION      FAMILY HISTORY: Family History  Problem Relation Age of Onset   Prostate cancer Other    Heart disease Other     Stroke Other    Diabetes Mother    Hypertension Mother    Diabetes Father    Cancer Father        COLON and LU NG   Colon cancer Father    Stroke Paternal Uncle    Breast cancer Paternal Aunt    Lung cancer Maternal Grandmother 45       Mesothelioma   Colon cancer Paternal Grandfather        dx over 77s   Lung cancer Paternal Aunt    Breast cancer Paternal Aunt    Breast cancer Paternal Aunt    Heart attack Maternal Grandfather 71   Colon cancer Other        MGM's 5 brothers  The patient's father died at age 104 due to metastatic liver cancer. The patient's mother is alive at 78. The patient's has 1 brother and 3 sisters. There was a paternal grandfather with colon cancer. There were 3 paternal aunts with breast cancer, 2 diagnosed in the 1's and 1 at age 44. There was a maternal grandmother with mesothelioma. The patient otherwise denies a history of ovarian cancer in the family.    GYNECOLOGIC HISTORY:  Patient's last menstrual period was 06/21/2010. Menarche: 50 years old Age at first live birth: 50 years old She is GXP5. Her LMP was December 2011.  She is status post partial hysterectomy without oophorectomy. She took oral contraception for 3 years with no complications. She never took HRT.    SOCIAL HISTORY: (Updated January 2022). 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  She is currently applying for disability.  At home are 2 of her sons, Glendell Docker and [name], and the patient's grandson, Amador Cunas  (who is Willie's son). The patient's oldest is Izell [place] who works in [employer] as a Training and development officer. Daughter, Jordan Hawks  works as a Scientist, water quality. Son, Glendell Docker is disabled. Son, [name] works in [place] as a Microbiologist. Daughter, Benard Rink also is a Microbiologist. The patient has 5 grandchildren and no great grandchildren. The patient does not belong to a church.    ADVANCED DIRECTIVES: Not in place; at the 01/18/2018 visit the patient was given the appropriate documents to complete on notarized at her  discretion   HEALTH MAINTENANCE: Social History   Tobacco Use   Smoking status: Never   Smokeless tobacco: Never  Vaping Use   Vaping Use: Never used  Substance Use Topics   Alcohol use: Yes    Alcohol/week: 0.0 standard drinks    Comment: occ   Drug use: No     Colonoscopy: Not yet  PAP: November 2018  Bone density: Never   Allergies  Allergen Reactions   Metformin And Related Other (See Comments)    Bowel frequency   Levaquin [Levofloxacin In D5w] Nausea And Vomiting and Rash   Penicillins Swelling and Rash    Has patient had a PCN reaction causing immediate rash, facial/tongue/throat swelling, SOB or lightheadedness with hypotension: Yes Has patient had a PCN reaction causing severe rash involving mucus membranes or skin necrosis: No Has patient had a PCN reaction that required hospitalization: Yes Has patient had a PCN reaction occurring within the last 10 years: No If all of the above answers are "NO", then may proceed with Cephalosporin use.    Current Outpatient Medications  Medication Sig Dispense Refill   albuterol (VENTOLIN HFA) 108 (90 Base) MCG/ACT inhaler Inhale 2 puffs into the lungs every 6 (six) hours as needed for wheezing or shortness of breath. 1 g 11   bictegravir-emtricitabine-tenofovir AF (BIKTARVY) 50-200-25 MG TABS tablet Take 1 tablet by mouth daily.     blood glucose meter kit and supplies Dispense based on patient and insurance preference. Use up to four times daily as directed. (FOR ICD-10 E10.9, E11.9). 1 each 0   carvedilol (COREG) 6.25 MG tablet Take 1 tablet (6.25 mg total) by mouth 2 (two) times daily. 60 tablet 11   Cholecalciferol (THERA-D 2000) 50 MCG (2000 UT) TABS 1 tab by mouth once daily 30 tablet 99   colchicine 0.6 MG tablet Take 1 tablet (0.6 mg total) by mouth daily. 90 tablet 3   Diclofenac Sodium (PENNSAID) 2 % SOLN Place 1 application onto the skin 2 (two) times daily. 112 g 2   Doxepin HCl 3 MG TABS Take 1 tablet (3 mg total)  by mouth at bedtime. 30 tablet 3   Dulaglutide (TRULICITY) 3 ZO/1.0RU SOPN Inject 3 mg into the skin once a week. 6 mL 3   ferrous gluconate (FERGON) 240 (27 FE) MG tablet Take 240 mg by mouth daily as needed (iron).      fluconazole (DIFLUCAN) 150 MG tablet Take 1 tablet (150 mg total) by mouth as needed. (Patient not taking: Reported on 04/22/2021) 5 tablet 0   gabapentin (NEURONTIN) 300 MG capsule Take 1 capsule (300 mg total) by mouth  at bedtime. 90 capsule 4   glipiZIDE (GLUCOTROL XL) 10 MG 24 hr tablet Take 1 tablet (10 mg total) by mouth daily with breakfast. 90 tablet 3   glucose blood (ONETOUCH VERIO) test strip USE 1 STRIP TO CHECK GLUCOSE 4 TIMES DAILY AS DIRECTED. DX: E11.69 100 each 5   hydrocortisone (ANUSOL-HC) 2.5 % rectal cream Place 1 application rectally 2 (two) times daily. 30 g 1   ibuprofen (ADVIL,MOTRIN) 800 MG tablet Take 1 tablet (800 mg total) by mouth every 8 (eight) hours as needed. 60 tablet 0   Insulin Pen Needle (PEN NEEDLES) 32G X 4 MM MISC Use with insulin pen 100 each 12   Lancets (ONETOUCH DELICA PLUS [id number]) MISC USE TO TEST BLOOD SUGAR UP TO 4 TIMES DAILY 100 each 5   loratadine (CLARITIN) 10 MG tablet Take 10 mg by mouth daily.     losartan (COZAAR) 25 MG tablet Take 1 tablet (25 mg total) by mouth daily. 30 tablet 11   Multiple Vitamin (MULTIVITAMIN WITH MINERALS) TABS tablet Take 1 tablet by mouth daily.     nitroGLYCERIN (NITRODUR - DOSED IN MG/24 HR) 0.2 mg/hr patch Apply 1/4 patch daily to tendon for tendonitis. 30 patch 1   nystatin-triamcinolone ointment (MYCOLOG) Apply 1 application topically 2 (two) times daily. 30 g 1   pioglitazone (ACTOS) 45 MG tablet Take 1 tablet (45 mg total) by mouth daily. 90 tablet 3   rosuvastatin (CRESTOR) 10 MG tablet Take 10 mg by mouth at bedtime.     tamoxifen (NOLVADEX) 20 MG tablet Take 1 tablet (20 mg total) by mouth daily. 90 tablet 4   valACYclovir (VALTREX) 500 MG tablet Take twice daily for 3-5 days 30 tablet 2    venlafaxine XR (EFFEXOR-XR) 75 MG 24 hr capsule Take 1 capsule (75 mg total) by mouth daily with breakfast. 90 capsule 4   zolpidem (AMBIEN) 10 MG tablet Take 1 tablet (10 mg total) by mouth at bedtime as needed. for sleep 90 tablet 1   No current facility-administered medications for this visit.    OBJECTIVE:  Vitals:   07/13/21 1038  BP: 136/89  Pulse: 86  Resp: 18  Temp: (!) 97.5 F (36.4 C)  SpO2: 99%      Body mass index is 46.02 kg/m.   Wt Readings from Last 3 Encounters:  07/13/21 251 lb 9.6 oz (114.1 kg)  04/22/21 255 lb (115.7 kg)  03/10/21 260 lb (117.9 kg)  ECOG FS: 2 - Symptomatic, <50% confined to bed GENERAL: Patient is a well appearing female in no acute distress HEENT:  Sclerae anicteric.  Oropharynx clear and moist. No ulcerations or evidence of oropharyngeal candidiasis. Neck is supple.  NODES:  No cervical, supraclavicular, or axillary lymphadenopathy palpated.  BREAST EXAM:  right breast s/p lumpectomy and radiation, no sign of local recurrence, left breast benign LUNGS:  Clear to auscultation bilaterally.  No wheezes or rhonchi. HEART:  Regular rate and rhythm. No murmur appreciated. ABDOMEN:  Soft, nontender.  Positive, normoactive bowel sounds. No organomegaly palpated. MSK:  No focal spinal tenderness to palpation. Full range of motion bilaterally in the upper extremities. EXTREMITIES:  No peripheral edema.   SKIN:  Clear with no obvious rashes or skin changes. No nail dyscrasia. NEURO:  Nonfocal. Well oriented.  Appropriate affect.    LAB RESULTS:  CMP     Component Value Date/Time   NA 138 10/17/2020 1607   K 4.1 10/17/2020 1607   CL 98 10/17/2020 1607  CO2 32 10/17/2020 1607   GLUCOSE 277 (H) 10/17/2020 1607   BUN 14 10/17/2020 1607   CREATININE 0.71 10/17/2020 1607   CREATININE 1.04 (H) 06/18/2020 1202   CALCIUM 9.5 10/17/2020 1607   PROT 7.4 10/14/2020 1009   ALBUMIN 3.7 10/14/2020 1009   AST 18 10/14/2020 1009   AST 29 06/18/2020  1202   ALT 22 10/14/2020 1009   ALT 26 06/18/2020 1202   ALKPHOS 120 10/14/2020 1009   BILITOT 0.5 10/14/2020 1009   BILITOT 0.5 06/18/2020 1202   GFRNONAA >60 10/14/2020 1009   GFRNONAA >60 06/18/2020 1202   GFRAA >60 04/16/2020 1119   GFRAA >60 10/11/2019 1054    No results found for: TOTALPROTELP, ALBUMINELP, A1GS, A2GS, BETS, BETA2SER, GAMS, MSPIKE, SPEI  No results found for: KPAFRELGTCHN, LAMBDASER, KAPLAMBRATIO  Lab Results  Component Value Date   WBC 3.5 (L) 07/13/2021   NEUTROABS 1.6 (L) 07/13/2021   HGB 13.1 07/13/2021   HCT 41.2 07/13/2021   MCV 88.0 07/13/2021   PLT 207 07/13/2021   No results found for: LABCA2  No components found for: FSELTR320  No results for input(s): INR in the last 168 hours.  No results found for: LABCA2  No results found for: EBX435  No results found for: WYS168  No results found for: HFG902  No results found for: CA2729  No components found for: HGQUANT  No results found for: CEA1 / No results found for: CEA1   No results found for: AFPTUMOR  No results found for: CHROMOGRNA  No results found for: HGBA, HGBA2QUANT, HGBFQUANT, HGBSQUAN (Hemoglobinopathy evaluation)   No results found for: LDH  Lab Results  Component Value Date   IRON 75 10/03/2019   IRONPCTSAT 20.9 10/03/2019   (Iron and TIBC)  No results found for: FERRITIN  Urinalysis    Component Value Date/Time   COLORURINE DARK YELLOW 03/10/2021 1036   APPEARANCEUR CLOUDY (A) 03/10/2021 1036   LABSPEC 1.025 03/10/2021 1036   PHURINE 5.5 03/10/2021 1036   GLUCOSEU NEGATIVE 03/10/2021 1036   GLUCOSEU >=1000 (A) 10/17/2020 1607   HGBUR NEGATIVE 03/10/2021 1036   BILIRUBINUR NEGATIVE 10/17/2020 1607   KETONESUR NEGATIVE 03/10/2021 1036   PROTEINUR NEGATIVE 03/10/2021 1036   UROBILINOGEN 0.2 10/17/2020 1607   NITRITE NEGATIVE 10/17/2020 1607   LEUKOCYTESUR NEGATIVE 10/17/2020 1607    STUDIES: MM 3D SCREEN BREAST BILATERAL  Result Date:  07/01/2021 CLINICAL DATA:  Screening. EXAM: DIGITAL SCREENING BILATERAL MAMMOGRAM WITH TOMOSYNTHESIS AND CAD TECHNIQUE: Bilateral screening digital craniocaudal and mediolateral oblique mammograms were obtained. Bilateral screening digital breast tomosynthesis was performed. The images were evaluated with computer-aided detection. COMPARISON:  Previous exam(s). ACR Breast Density Category a: The breast tissue is almost entirely fatty. FINDINGS: There are no findings suspicious for malignancy. IMPRESSION: No mammographic evidence of malignancy. A result letter of this screening mammogram will be mailed directly to the patient. RECOMMENDATION: Screening mammogram in one year. (Code:[id number]-B-01Y) BI-RADS CATEGORY  1: Negative. Electronically Signed   By: Marin Olp M.D.   On: 07/01/2021 11:02     ELIGIBLE FOR AVAILABLE RESEARCH PROTOCOL: no   ASSESSMENT: 50 y.o. [place], [name] woman status post right breast upper outer quadrant biopsy 01/11/2018 for a clinical T1 N0, stage IA invasive ductal carcinoma, grade 3, estrogen and progesterone receptor positive, HER-2 amplified, with an MIB-1 of 30%.  (1) status post right lumpectomy and sentinel lymph node sampling 02/23/2018 for a pT2 pN1, stage IB invasive ductal carcinoma, grade 3, with close but negative margins  (  2) adjuvant chemotherapy consisting of carboplatin, docetaxel, trastuzumab and Pertuzumab every 21 days x 6 given between 03/08/2018-07/05/2018 (a) pertuzumab stopped after cycle 1 due to diarrhea (b) docetaxel changed to gemcitabine after cycle 2 due to persistent hyperglycemia (c) Gemcitabine and Carboplatin dose reduced by approximately 10% due to delayed neutropenia and thrombocytopenia  (3) anti-HER-2 immunotherapy started concurrently with chemotherapy (a) baseline echocardiogram on 02/07/2018 showed an ejection fraction in the 55-60% range (b) repeat echo 06/19/2018 showed an ejection fraction of 55-60% (c) repeat echocardiogram  11/14/2018 showed an EF in the 50-55% range, with normal strain. (d) echo 01/31/2019 shows an ejection fraction in the 55/60% range (e) final trastuzumab dose 03/12/2019  (4) adjuvant radiation 07/27/2018 - 09/06/2018  Site/dose: 1. Right Breast / 50 Gy in 25 fractions    2. Right Supraclavicular nodes / 50 Gy in 25 fractions    3. Right Breast Boost / 10 Gy in 5 fractions  (5) tamoxifen started November 2020  (6) genetics testing through [facility] Multi-cancer and Breast panel on 01/13/2018 showed: no deleterious mutations. The following genes were evaluated for sequence changes and exonic deletions/duplications: ALK, APC, ATM, AXIN2, BAP1, BARD1, BLM, BMPR1A, BRCA1, BRCA2, BRIP1, CASR, CDC73, CDH1, CDK4, CDKN1B, CDKN1C,CDKN2A (p14ARF), CDKN2A (p16INK4a), CEBPA, CHEK2, CTNNA1, DICER1, DIS3L2, EPCAM*, FH, FLCN, GATA2, GPC3, GREM1*, HRAS, KIT, MAX, MEN1, MET, MLH1, MSH2, MSH3, MSH6, MUTYH, NBN, NF1, NF2, PALB2, PDGFRA, PHOX2B*, PMS2, POLD1,POLE, POT1, PRKAR1A, PTCH1, PTEN, RAD50, RAD51C, RAD51D, RB1, RECQL4, RET, RUNX1, SDHAF2, SDHB, SDHC, SDHD,SMAD4, SMARCA4, SMARCB1, SMARCE1, STK11, SUFU, TERC, TERT, TMEM127, TP53, TSC1, TSC2, VHL, WRN*, WT1.The following genes were evaluated for sequence changes only: EGFR*, HOXB13*, MITF*, NTHL1*, SDHA   PLAN: Kelly Rowe is here for follow-up of her estrogen positive breast cancer.  She has no clinical or radiographic sign of breast cancer recurrence.  She continues on tamoxifen with good tolerance.  She will continue this.  She and I discussed healthy diet and exercise.  I encouraged her to continue with exercise and to strive to meet 150 to 300 minutes a week of brisk activity.  We discussed the issue of her frequent vaginal yeast infections.  This can be due to elevated blood sugars persistently.  However, it can also be a side effect of the tamoxifen since it can cause some vaginal discharge.  She will let me know if she continues to have yeast infections.   She says that she is worked diligently to get her sugars down.  She cannot recall when her last A1c was done.  In reviewing the computer it appears to have been collected in March 2022.  I placed orders for her A1c to be added on so we can evaluate her sugar control as that can contribute to her frequent yeast infections as well.  Gabriellia will follow-up in 6 months for labs and evaluation with Dr. Chryl Heck.  She knows to call for any questions or concerns that may arise before her next visit here.   Total encounter time 30 minutes.*In face-to-face visit time, chart review, lab review, order entry, care coordination, and documentation of the encounter.  Wilber Bihari, NP 07/13/21 10:46 AM Medical Oncology and Hematology [facility] [place], [place] [address] Tel. [phone]    Fax. [phone]   *Total Encounter Time as defined by the Centers for Medicare and Medicaid Services includes, in addition to the face-to-face time of a patient visit (documented in the note above) non-face-to-face time: obtaining and reviewing outside history, ordering and reviewing medications,  tests or procedures, care coordination (communications with other health care professionals or caregivers) and documentation in the medical record.

## 2021-07-14 ENCOUNTER — Telehealth: Payer: Self-pay

## 2021-07-14 NOTE — Telephone Encounter (Signed)
Called pt, left VM informing pt needing to discuss lab work (a1c) and to return call.

## 2021-07-14 NOTE — Telephone Encounter (Signed)
X2 attempts to call pt, left VM on both occassions for pt to call back for further discussion of her lab results.

## 2021-07-16 ENCOUNTER — Other Ambulatory Visit: Payer: Self-pay | Admitting: Internal Medicine

## 2021-07-16 NOTE — Telephone Encounter (Signed)
Please refill as per office routine med refill policy (all routine meds to be refilled for 3 mo or monthly (per pt preference) up to one year from last visit, then month to month grace period for 3 mo, then further med refills will have to be denied) ? ?

## 2021-07-24 ENCOUNTER — Ambulatory Visit: Payer: BC Managed Care – PPO | Admitting: Internal Medicine

## 2021-08-05 ENCOUNTER — Ambulatory Visit: Payer: BC Managed Care – PPO | Admitting: Internal Medicine

## 2021-08-11 ENCOUNTER — Other Ambulatory Visit: Payer: Self-pay

## 2021-08-11 ENCOUNTER — Encounter: Payer: Self-pay | Admitting: Internal Medicine

## 2021-08-11 ENCOUNTER — Ambulatory Visit (INDEPENDENT_AMBULATORY_CARE_PROVIDER_SITE_OTHER): Payer: Medicare PPO | Admitting: Internal Medicine

## 2021-08-11 ENCOUNTER — Other Ambulatory Visit (HOSPITAL_COMMUNITY): Payer: Self-pay

## 2021-08-11 VITALS — BP 130/80 | HR 77 | Temp 98.7°F | Ht 62.0 in | Wt 251.0 lb

## 2021-08-11 DIAGNOSIS — E559 Vitamin D deficiency, unspecified: Secondary | ICD-10-CM | POA: Diagnosis not present

## 2021-08-11 DIAGNOSIS — F418 Other specified anxiety disorders: Secondary | ICD-10-CM | POA: Diagnosis not present

## 2021-08-11 DIAGNOSIS — E7800 Pure hypercholesterolemia, unspecified: Secondary | ICD-10-CM

## 2021-08-11 DIAGNOSIS — F5101 Primary insomnia: Secondary | ICD-10-CM | POA: Diagnosis not present

## 2021-08-11 DIAGNOSIS — I10 Essential (primary) hypertension: Secondary | ICD-10-CM

## 2021-08-11 DIAGNOSIS — Z0001 Encounter for general adult medical examination with abnormal findings: Secondary | ICD-10-CM

## 2021-08-11 DIAGNOSIS — E08319 Diabetes mellitus due to underlying condition with unspecified diabetic retinopathy without macular edema: Secondary | ICD-10-CM

## 2021-08-11 DIAGNOSIS — E1169 Type 2 diabetes mellitus with other specified complication: Secondary | ICD-10-CM

## 2021-08-11 DIAGNOSIS — E538 Deficiency of other specified B group vitamins: Secondary | ICD-10-CM | POA: Diagnosis not present

## 2021-08-11 MED ORDER — BELSOMRA 15 MG PO TABS
15.0000 mg | ORAL_TABLET | Freq: Every evening | ORAL | 5 refills | Status: DC | PRN
  Filled 2021-08-11 – 2021-08-31 (×3): qty 30, 30d supply, fill #0

## 2021-08-11 MED ORDER — TIRZEPATIDE 2.5 MG/0.5ML ~~LOC~~ SOAJ
2.5000 mg | SUBCUTANEOUS | 3 refills | Status: DC
  Filled 2021-08-11 – 2021-08-12 (×2): qty 6, 84d supply, fill #0

## 2021-08-11 MED ORDER — FLUOXETINE HCL 20 MG PO CAPS
20.0000 mg | ORAL_CAPSULE | Freq: Every day | ORAL | 3 refills | Status: DC
  Filled 2021-08-11: qty 90, 90d supply, fill #0

## 2021-08-11 NOTE — Assessment & Plan Note (Signed)
Last vitamin D Lab Results  Component Value Date   VD25OH 25.96 (L) 10/17/2020   Low, to start oral replacement

## 2021-08-11 NOTE — Progress Notes (Signed)
Patient ID: Kelly Rowe, female   DOB: 1971/06/03, 51 y.o.   MRN: [id number]         Chief Complaint:: wellness exam and dm, obesity, insomnia, depression       HPI:  Kelly Rowe is a 51 y.o. female here for wellness exam; pt plans to call for GYN and optho appt soon for herself; declines shingrix o/w up to date                        Also c/po mild new worsening depressive symptom for the past 1 mo but no suicidal ideation, or panic; has ongoing anxiety.  Very hard to lose wt recently despite working on diet and trying to be more active.  Has also had worsening sleep in the past 2 wks and ambien just not effective even taking 2 at bedtime.  chest pain   Pt denies polydipsia, polyuria, or new focal neuro s/s.    Pt denies fever, wt loss, night sweats, loss of appetite, or other constitutional symptoms   Wt Readings from Last 3 Encounters:  08/11/21 251 lb (113.9 kg)  07/13/21 251 lb 9.6 oz (114.1 kg)  04/22/21 255 lb (115.7 kg)   BP Readings from Last 3 Encounters:  08/11/21 130/80  07/13/21 136/89  04/22/21 138/84   Immunization History  Administered Date(s) Administered   Hep A / Hep B 09/05/2008, 10/31/2008, 05/01/2009   Influenza Split 05/30/2012, 03/31/2016   Influenza,inj,Quad PF,6+ Mos 05/30/2012, 03/31/2016, 04/05/2019   Influenza,inj,quad, With Preservative 08/08/2013, 05/10/2014   Influenza,trivalent, recombinat, inj, PF 03/31/2018   PPD Test 01/10/2015   Pneumococcal Conjugate-13 08/08/2013   Pneumococcal Polysaccharide-23 05/23/2008, 05/10/2014   Td 03/12/2009   Tdap 11/02/2010, 04/05/2019   There are no preventive care reminders to display for this patient.     Past Medical History:  Diagnosis Date   ANEMIA-IRON DEFICIENCY 01/30/2010   ANXIETY 11/27/2007   Arthritis    Asthma 02/25/2011   Cancer ([place])    breast   Carbuncle and furuncle of trunk 04/16/2010   Chlamydia infection 03/21/2008   DIABETES MELLITUS, TYPE II 08/02/2007   Edema 01/30/2010    ELEVATED BLOOD PRESSURE WITHOUT DIAGNOSIS OF HYPERTENSION 08/02/2007   Family history of breast cancer    Family history of colon cancer    Family history of lung cancer    FREQUENCY, URINARY 05/19/2009   GENITAL HERPES 03/12/2009   GERD (gastroesophageal reflux disease)    History of kidney stones    History of radiation therapy 07/27/18- 09/06/18   Right Breast, Right Axillary and Supraclavicular nodes 50 Gy in 25 fractions, Right Breast Boost 10 Gy in 5 fractions   HIV (human immunodeficiency virus infection) ([place]) 2009   HIV INFECTION 03/12/2009   HSV (herpes simplex virus) infection    HYPERLIPIDEMIA 08/02/2007   Hypertension    Metrorrhagia 03/21/2008   PONV (postoperative nausea and vomiting)    woke up crying per patient    RETENTION, URINE 05/19/2009   Sleep apnea    Trichomonas infection 01/19/2010   VITAMIN D DEFICIENCY 01/30/2010   Past Surgical History:  Procedure Laterality Date   ABDOMINAL HYSTERECTOMY  07/22/2010   TAH WITH PRESERVATION OF BOTH TUBES AND OVARIES   BREAST LUMPECTOMY Right 2019   BREAST LUMPECTOMY WITH RADIOACTIVE SEED AND SENTINEL LYMPH NODE BIOPSY Right 02/23/2018   Procedure: BREAST LUMPECTOMY WITH RADIOACTIVE SEED AND SENTINEL LYMPH NODE BIOPSY;  Surgeon: Stark Klein, MD;  Location: [facility];  Service: General;  Laterality: Right;   CHOLECYSTECTOMY     ENDOMETRIAL ABLATION  01/11/2008   HER OPTION   ESOPHAGOGASTRODUODENOSCOPY     MULTIPLE TOOTH EXTRACTIONS     PORT-A-CATH REMOVAL Left 05/08/2019   Procedure: REMOVAL PORT-A-CATH;  Surgeon: Stark Klein, MD;  Location: [facility];  Service: General;  Laterality: Left;   PORTACATH PLACEMENT Left 02/23/2018   Procedure: INSERTION PORT-A-CATH;  Surgeon: Stark Klein, MD;  Location: [facility];  Service: General;  Laterality: Left;   PORTACATH PLACEMENT N/A 04/21/2018   Procedure: PORT-A-CATH REVISION;  Surgeon: Stark Klein, MD;  Location[facility];  Service: General;  Laterality: N/A;   TUBAL LIGATION      WISDOM TOOTH EXTRACTION      reports that she has never smoked. She has never used smokeless tobacco. She reports current alcohol use. She reports that she does not use drugs. family history includes Breast cancer in her paternal aunt, paternal aunt, and paternal aunt; Cancer in her father; Colon cancer in her father, paternal grandfather, and another family member; Diabetes in her father and mother; Heart attack (age of onset: 83) in her maternal grandfather; Heart disease in an other family member; Hypertension in her mother; Lung cancer in her paternal aunt; Lung cancer (age of onset: 65) in her maternal grandmother; Prostate cancer in an other family member; Stroke in her paternal uncle and another family member. Allergies  Allergen Reactions   Metformin And Related Other (See Comments)    Bowel frequency   Levaquin [Levofloxacin In D5w] Nausea And Vomiting and Rash   Penicillins Swelling and Rash    Has patient had a PCN reaction causing immediate rash, facial/tongue/throat swelling, SOB or lightheadedness with hypotension: Yes Has patient had a PCN reaction causing severe rash involving mucus membranes or skin necrosis: No Has patient had a PCN reaction that required hospitalization: Yes Has patient had a PCN reaction occurring within the last 10 years: No If all of the above answers are "NO", then may proceed with Cephalosporin use.   Current Outpatient Medications on File Prior to Visit  Medication Sig Dispense Refill   albuterol (VENTOLIN HFA) 108 (90 Base) MCG/ACT inhaler Inhale 2 puffs into the lungs every 6 (six) hours as needed for wheezing or shortness of breath. 1 g 11   bictegravir-emtricitabine-tenofovir AF (BIKTARVY) 50-200-25 MG TABS tablet Take 1 tablet by mouth daily.     blood glucose meter kit and supplies Dispense based on patient and insurance preference. Use up to four times daily as directed. (FOR ICD-10 E10.9, E11.9). 1 each 0   carvedilol (COREG) 6.25 MG tablet  TAKE 1 TABLET(6.25 MG) BY MOUTH TWICE DAILY 60 tablet 11   Cholecalciferol (THERA-D 2000) 50 MCG (2000 UT) TABS 1 tab by mouth once daily 30 tablet 99   colchicine 0.6 MG tablet Take 1 tablet (0.6 mg total) by mouth daily. 90 tablet 3   Diclofenac Sodium (PENNSAID) 2 % SOLN Place 1 application onto the skin 2 (two) times daily. 112 g 2   Doxepin HCl 3 MG TABS Take 1 tablet (3 mg total) by mouth at bedtime. 30 tablet 3   ferrous gluconate (FERGON) 240 (27 FE) MG tablet Take 240 mg by mouth daily as needed (iron).      gabapentin (NEURONTIN) 300 MG capsule Take 1 capsule (300 mg total) by mouth at bedtime. 90 capsule 4   glipiZIDE (GLUCOTROL XL) 10 MG 24 hr tablet TAKE 1 TABLET(10 MG) BY MOUTH DAILY WITH BREAKFAST  90 tablet 3   glucose blood (ONETOUCH VERIO) test strip USE 1 STRIP TO CHECK GLUCOSE 4 TIMES DAILY AS DIRECTED. DX: E11.69 100 each 5   hydrocortisone (ANUSOL-HC) 2.5 % rectal cream Place 1 application rectally 2 (two) times daily. 30 g 1   ibuprofen (ADVIL,MOTRIN) 800 MG tablet Take 1 tablet (800 mg total) by mouth every 8 (eight) hours as needed. 60 tablet 0   Insulin Pen Needle (PEN NEEDLES) 32G X 4 MM MISC Use with insulin pen 100 each 12   Lancets (ONETOUCH DELICA PLUS [id number]) MISC USE TO TEST BLOOD SUGAR UP TO 4 TIMES DAILY 100 each 5   loratadine (CLARITIN) 10 MG tablet Take 10 mg by mouth daily.     losartan (COZAAR) 25 MG tablet Take 1 tablet (25 mg total) by mouth daily. 30 tablet 11   Multiple Vitamin (MULTIVITAMIN WITH MINERALS) TABS tablet Take 1 tablet by mouth daily.     nitroGLYCERIN (NITRODUR - DOSED IN MG/24 HR) 0.2 mg/hr patch Apply 1/4 patch daily to tendon for tendonitis. 30 patch 1   nystatin-triamcinolone ointment (MYCOLOG) Apply 1 application topically 2 (two) times daily. 30 g 1   pioglitazone (ACTOS) 45 MG tablet Take 1 tablet (45 mg total) by mouth daily. 90 tablet 3   rosuvastatin (CRESTOR) 10 MG tablet TAKE 1 TABLET(10 MG) BY MOUTH DAILY 90 tablet 1    tamoxifen (NOLVADEX) 20 MG tablet Take 1 tablet (20 mg total) by mouth daily. 90 tablet 4   valACYclovir (VALTREX) 500 MG tablet Take twice daily for 3-5 days 30 tablet 2   No current facility-administered medications on file prior to visit.        ROS:  All others reviewed and negative.  Objective        PE:  BP 130/80 (BP Location: Left Arm, Patient Position: Sitting, Cuff Size: Large)    Pulse 77    Temp 98.7 F (37.1 C) (Oral)    Ht 5' 2" (1.575 m)    Wt 251 lb (113.9 kg)    LMP 06/21/2010    SpO2 100%    BMI 45.91 kg/m                 Constitutional: Pt appears in NAD               HENT: Head: NCAT.                Right Ear: External ear normal.                 Left Ear: External ear normal.                Eyes: . Pupils are equal, round, and reactive to light. Conjunctivae and EOM are normal               Nose: without d/c or deformity               Neck: Neck supple. Gross normal ROM               Cardiovascular: Normal rate and regular rhythm.                 Pulmonary/Chest: Effort normal and breath sounds without rales or wheezing.                Abd:  Soft, NT, ND, + BS, no organomegaly               Neurological: Pt  is alert. At baseline orientation, motor grossly intact               Skin: Skin is warm. No rashes, no other new lesions, LE edema - none               Psychiatric: Pt behavior is normal without agitation , depressed affec  Micro: none  Cardiac tracings I have personally interpreted today:  none  Pertinent Radiological findings (summarize): none   Lab Results  Component Value Date   WBC 3.5 (L) 07/13/2021   HGB 13.1 07/13/2021   HCT 41.2 07/13/2021   PLT 207 07/13/2021   GLUCOSE 189 (H) 07/13/2021   CHOL 207 (H) 10/17/2020   TRIG 152.0 (H) 10/17/2020   HDL 56.70 10/17/2020   LDLDIRECT 143.0 10/03/2018   LDLCALC 120 (H) 10/17/2020   ALT 17 07/13/2021   AST 20 07/13/2021   NA 139 07/13/2021   K 3.8 07/13/2021   CL 102 07/13/2021   CREATININE  0.90 07/13/2021   BUN 10 07/13/2021   CO2 27 07/13/2021   TSH 2.66 10/17/2020   HGBA1C 11.2 (H) 07/13/2021   MICROALBUR <0.7 10/17/2020   Assessment/Plan:  Jaymi Tinner is a 51 y.o. Black or African American [2] female with  has a past medical history of ANEMIA-IRON DEFICIENCY (01/30/2010), ANXIETY (11/27/2007), Arthritis, Asthma (02/25/2011), Cancer ([place]), Carbuncle and furuncle of trunk (04/16/2010), Chlamydia infection (03/21/2008), DIABETES MELLITUS, TYPE II (08/02/2007), Edema (01/30/2010), ELEVATED BLOOD PRESSURE WITHOUT DIAGNOSIS OF HYPERTENSION (08/02/2007), Family history of breast cancer, Family history of colon cancer, Family history of lung cancer, FREQUENCY, URINARY (05/19/2009), GENITAL HERPES (03/12/2009), GERD (gastroesophageal reflux disease), History of kidney stones, History of radiation therapy (07/27/18- 09/06/18), HIV (human immunodeficiency virus infection) ([place]) (2009), HIV INFECTION (03/12/2009), HSV (herpes simplex virus) infection, HYPERLIPIDEMIA (08/02/2007), Hypertension, Metrorrhagia (03/21/2008), PONV (postoperative nausea and vomiting), RETENTION, URINE (05/19/2009), Sleep apnea, Trichomonas infection (01/19/2010), and VITAMIN D DEFICIENCY (01/30/2010).  Vitamin D deficiency Last vitamin D Lab Results  Component Value Date   VD25OH 25.96 (L) 10/17/2020   Low, to start oral replacement   Encounter for well adult exam with abnormal findings Age and sex appropriate education and counseling updated with regular exercise and diet Referrals for preventative services - plans to call for gyn and eye appointments herself Immunizations addressed - declines shingrix Smoking counseling  - none needed Evidence for depression or other mood disorder - worsening depression/anxiety - for new prozac 20 qd Most recent labs reviewed. 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, without long-term current use of insulin (HCC) Lab Results  Component Value Date   HGBA1C 11.2 (H) 07/13/2021   Severe recent uncontrolled, encouraged compliance, add monjouro to hopefully help with wt loss as well,, pt to continue current medical treatment glucotrol, actos and DM diet   Hyperlipidemia Lab Results  Component Value Date   LDLCALC 120 (H) 10/17/2020   Uncontrolled, goal ldl < 70, pt to continue crestor and f/u lab - may need increased dose   Hypertension BP Readings from Last 3 Encounters:  08/11/21 130/80  07/13/21 136/89  04/22/21 138/84   Stable, pt to  continue medical treatment coreg, losartan   Insomnia With recent worsening likely releated to worsening depression - for change ambien to belsomra asd,  to f/u any worsening symptoms or concerns  Followup: Return in about 3 months (around 11/09/2021).  Cathlean Cower, MD 08/16/2021 6:20 AM [facility]

## 2021-08-11 NOTE — Patient Instructions (Addendum)
Please take all new medication as prescribed - the mounjaro, belsomra for sleep, and prozac for mood  Please call in 1 month for the higher strength of mounjaro if you are taking it without stomach upset  Please continue all other medications as before, and refills have been done if requested.  Please have the pharmacy call with any other refills you may need.  Please continue your efforts at being more active, low cholesterol diet, and weight control.  Please keep your appointments with your specialists as you may have planned  Please make an Appointment to return in 3 months, or sooner if needed, also with Lab Appointment for testing done 3-5 days before at the [facility] (so this is for TWO appointments - please see the scheduling desk as you leave)  Due to the ongoing Covid 19 pandemic, our lab now requires an appointment for any labs done at our office.  If you need labs done and do not have an appointment, please call our office ahead of time to schedule before presenting to the lab for your testing.

## 2021-08-12 ENCOUNTER — Other Ambulatory Visit (HOSPITAL_COMMUNITY): Payer: Self-pay

## 2021-08-16 ENCOUNTER — Encounter: Payer: Self-pay | Admitting: Internal Medicine

## 2021-08-16 NOTE — Assessment & Plan Note (Signed)
Lab Results  Component Value Date   HGBA1C 11.2 (H) 07/13/2021   Severe recent uncontrolled, encouraged compliance, add monjouro to hopefully help with wt loss as well,, pt to continue current medical treatment glucotrol, actos and DM diet

## 2021-08-16 NOTE — Assessment & Plan Note (Signed)
BP Readings from Last 3 Encounters:  08/11/21 130/80  07/13/21 136/89  04/22/21 138/84   Stable, pt to continue medical treatment coreg, losartan

## 2021-08-16 NOTE — Assessment & Plan Note (Signed)
With recent worsening likely releated to worsening depression - for change ambien to belsomra asd,  to f/u any worsening symptoms or concerns

## 2021-08-16 NOTE — Assessment & Plan Note (Signed)
Lab Results  Component Value Date   LDLCALC 120 (H) 10/17/2020   Uncontrolled, goal ldl < 70, pt to continue crestor and f/u lab - may need increased dose

## 2021-08-16 NOTE — Assessment & Plan Note (Signed)
With recently worsening, declines need for referral for counseling, now for start prozac 20 qd

## 2021-08-16 NOTE — Assessment & Plan Note (Signed)
Age and sex appropriate education and counseling updated with regular exercise and diet Referrals for preventative services - plans to call for gyn and eye appointments herself Immunizations addressed - declines shingrix Smoking counseling  - none needed Evidence for depression or other mood disorder - worsening depression/anxiety - for new prozac 20 qd Most recent labs reviewed. I have personally reviewed and have noted: 1) the patient's medical and social history 2) The patient's current medications and supplements 3) The patient's height, weight, and BMI have been recorded in the chart

## 2021-08-19 ENCOUNTER — Other Ambulatory Visit (HOSPITAL_COMMUNITY): Payer: Self-pay

## 2021-08-27 ENCOUNTER — Other Ambulatory Visit (HOSPITAL_COMMUNITY): Payer: Self-pay

## 2021-08-28 ENCOUNTER — Other Ambulatory Visit (HOSPITAL_COMMUNITY): Payer: Self-pay

## 2021-08-31 ENCOUNTER — Other Ambulatory Visit (HOSPITAL_COMMUNITY): Payer: Self-pay

## 2021-10-15 ENCOUNTER — Ambulatory Visit: Payer: BC Managed Care – PPO | Admitting: Adult Health

## 2021-10-15 ENCOUNTER — Other Ambulatory Visit: Payer: BC Managed Care – PPO

## 2021-11-06 ENCOUNTER — Ambulatory Visit: Payer: Medicare PPO | Admitting: Internal Medicine

## 2021-11-06 ENCOUNTER — Encounter: Payer: Self-pay | Admitting: Internal Medicine

## 2021-11-06 VITALS — BP 118/68 | HR 85 | Temp 98.2°F | Ht 62.0 in | Wt 246.0 lb

## 2021-11-06 DIAGNOSIS — E559 Vitamin D deficiency, unspecified: Secondary | ICD-10-CM | POA: Diagnosis not present

## 2021-11-06 DIAGNOSIS — I10 Essential (primary) hypertension: Secondary | ICD-10-CM | POA: Diagnosis not present

## 2021-11-06 DIAGNOSIS — E1169 Type 2 diabetes mellitus with other specified complication: Secondary | ICD-10-CM | POA: Diagnosis not present

## 2021-11-06 DIAGNOSIS — Z7184 Encounter for health counseling related to travel: Secondary | ICD-10-CM | POA: Diagnosis not present

## 2021-11-06 DIAGNOSIS — E538 Deficiency of other specified B group vitamins: Secondary | ICD-10-CM

## 2021-11-06 LAB — CBC WITH DIFFERENTIAL/PLATELET
Basophils Absolute: 0 10*3/uL (ref 0.0–0.1)
Basophils Relative: 0.4 % (ref 0.0–3.0)
Eosinophils Absolute: 0 10*3/uL (ref 0.0–0.7)
Eosinophils Relative: 0.9 % (ref 0.0–5.0)
HCT: 39.5 % (ref 36.0–46.0)
Hemoglobin: 13 g/dL (ref 12.0–15.0)
Lymphocytes Relative: 43.2 % (ref 12.0–46.0)
Lymphs Abs: 1.5 10*3/uL (ref 0.7–4.0)
MCHC: 32.9 g/dL (ref 30.0–36.0)
MCV: 86.4 fl (ref 78.0–100.0)
Monocytes Absolute: 0.2 10*3/uL (ref 0.1–1.0)
Monocytes Relative: 6.9 % (ref 3.0–12.0)
Neutro Abs: 1.6 10*3/uL (ref 1.4–7.7)
Neutrophils Relative %: 48.6 % (ref 43.0–77.0)
Platelets: 198 10*3/uL (ref 150.0–400.0)
RBC: 4.57 Mil/uL (ref 3.87–5.11)
RDW: 13.2 % (ref 11.5–15.5)
WBC: 3.4 10*3/uL — ABNORMAL LOW (ref 4.0–10.5)

## 2021-11-06 LAB — VITAMIN D 25 HYDROXY (VIT D DEFICIENCY, FRACTURES): VITD: 20.05 ng/mL — ABNORMAL LOW (ref 30.00–100.00)

## 2021-11-06 LAB — URINALYSIS, ROUTINE W REFLEX MICROSCOPIC
Bilirubin Urine: NEGATIVE
Hgb urine dipstick: NEGATIVE
Nitrite: NEGATIVE
Specific Gravity, Urine: >=1.03 — AB (ref 1.000–1.030)
Total Protein, Urine: NEGATIVE
Urine Glucose: NEGATIVE
Urobilinogen, UA: 0.2 (ref 0.0–1.0)
pH: 6 (ref 5.0–8.0)

## 2021-11-06 LAB — BASIC METABOLIC PANEL
BUN: 12 mg/dL (ref 6–23)
CO2: 27 mEq/L (ref 19–32)
Calcium: 9.1 mg/dL (ref 8.4–10.5)
Chloride: 98 mEq/L (ref 96–112)
Creatinine, Ser: 0.67 mg/dL (ref 0.40–1.20)
GFR: 100.17 mL/min (ref >60.00)
Glucose, Bld: 219 mg/dL — ABNORMAL HIGH (ref 70–99)
Potassium: 3.6 mEq/L (ref 3.5–5.1)
Sodium: 134 mEq/L — ABNORMAL LOW (ref 135–145)

## 2021-11-06 LAB — HEPATIC FUNCTION PANEL
ALT: 17 U/L (ref 0–35)
AST: 17 U/L (ref 0–37)
Albumin: 4 g/dL (ref 3.5–5.2)
Alkaline Phosphatase: 132 U/L — ABNORMAL HIGH (ref 39–117)
Bilirubin, Direct: 0 mg/dL (ref 0.0–0.3)
Total Bilirubin: 0.2 mg/dL (ref 0.2–1.2)
Total Protein: 6.8 g/dL (ref 6.0–8.3)

## 2021-11-06 LAB — LIPID PANEL
Cholesterol: 211 mg/dL — ABNORMAL HIGH (ref 0–200)
HDL: 47.3 mg/dL (ref >39.00)
LDL Cholesterol: 131 mg/dL — ABNORMAL HIGH (ref 0–99)
NonHDL: 163.42
Total CHOL/HDL Ratio: 4
Triglycerides: 162 mg/dL — ABNORMAL HIGH (ref 0.0–149.0)
VLDL: 32.4 mg/dL (ref 0.0–40.0)

## 2021-11-06 LAB — MICROALBUMIN / CREATININE URINE RATIO
Creatinine,U: 187.6 mg/dL
Microalb Creat Ratio: 0.4 mg/g (ref 0.0–30.0)
Microalb, Ur: 0.8 mg/dL (ref 0.0–1.9)

## 2021-11-06 LAB — HEMOGLOBIN A1C: Hgb A1c MFr Bld: 11.4 % — ABNORMAL HIGH (ref 4.6–6.5)

## 2021-11-06 LAB — TSH: TSH: 4.68 u[IU]/mL (ref 0.35–5.50)

## 2021-11-06 LAB — VITAMIN B12: Vitamin B-12: 1066 pg/mL — ABNORMAL HIGH (ref 211–911)

## 2021-11-06 MED ORDER — TIRZEPATIDE 5 MG/0.5ML ~~LOC~~ SOAJ: 5.0000 mg | SUBCUTANEOUS | 11 refills | Status: DC

## 2021-11-06 MED ORDER — ONETOUCH VERIO VI STRP: ORAL_STRIP | 11 refills | Status: DC

## 2021-11-06 MED ORDER — SCOPOLAMINE 1 MG/3DAYS TD PT72: 1.0000 | MEDICATED_PATCH | TRANSDERMAL | 12 refills | Status: AC

## 2021-11-06 NOTE — Progress Notes (Signed)
Patient ID: Kelly Rowe, female   DOB: 1971/03/26, 51 y.o.   MRN: [id number] ? ? ? ?    Chief Complaint: follow up HTN, HLD and hyperglycemia , low vit d, travel advice ? ?     HPI:  Kelly Rowe is a 51 y.o. female overall doing ok, Pt denies chest pain, increased sob or doe, wheezing, orthopnea, PND, increased LE swelling, palpitations, dizziness or syncope.   Pt denies polydipsia, polyuria, or new focal neuro s/s.    Pt denies fever, night sweats, loss of appetite, or other constitutional symptoms  Going on a second cruise of her life and asks for scop path.  Wt down 5 lbs with better diet.   ?Wt Readings from Last 3 Encounters:  ?11/06/21 246 lb (111.6 kg)  ?08/11/21 251 lb (113.9 kg)  ?07/13/21 251 lb 9.6 oz (114.1 kg)  ? ?BP Readings from Last 3 Encounters:  ?11/06/21 118/68  ?08/11/21 130/80  ?07/13/21 136/89  ? ?      ?Past Medical History:  ?Diagnosis Date  ? ANEMIA-IRON DEFICIENCY 01/30/2010  ? ANXIETY 11/27/2007  ? Arthritis   ? Asthma 02/25/2011  ? Cancer [facility])   ? breast  ? Carbuncle and furuncle of trunk 04/16/2010  ? Chlamydia infection 03/21/2008  ? DIABETES MELLITUS, TYPE II 08/02/2007  ? Edema 01/30/2010  ? ELEVATED BLOOD PRESSURE WITHOUT DIAGNOSIS OF HYPERTENSION 08/02/2007  ? Family history of breast cancer   ? Family history of colon cancer   ? Family history of lung cancer   ? FREQUENCY, URINARY 05/19/2009  ? GENITAL HERPES 03/12/2009  ? GERD (gastroesophageal reflux disease)   ? History of kidney stones   ? History of radiation therapy 07/27/18- 09/06/18  ? Right Breast, Right Axillary and Supraclavicular nodes 50 Gy in 25 fractions, Right Breast Boost 10 Gy in 5 fractions  ? HIV (human immunodeficiency virus infection) ([place]) 2009  ? HIV INFECTION 03/12/2009  ? HSV (herpes simplex virus) infection   ? HYPERLIPIDEMIA 08/02/2007  ? Hypertension   ? Metrorrhagia 03/21/2008  ? PONV (postoperative nausea and vomiting)   ? woke up crying per patient   ? RETENTION, URINE 05/19/2009  ? Sleep apnea   ?  Trichomonas infection 01/19/2010  ? VITAMIN D DEFICIENCY 01/30/2010  ? ?Past Surgical History:  ?Procedure Laterality Date  ? ABDOMINAL HYSTERECTOMY  07/22/2010  ? TAH WITH PRESERVATION OF BOTH TUBES AND OVARIES  ? BREAST LUMPECTOMY Right 2019  ? BREAST LUMPECTOMY WITH RADIOACTIVE SEED AND SENTINEL LYMPH NODE BIOPSY Right 02/23/2018  ? Procedure: BREAST LUMPECTOMY WITH RADIOACTIVE SEED AND SENTINEL LYMPH NODE BIOPSY;  Surgeon: Stark Klein, MD;  Location: [facility];  Service: General;  Laterality: Right;  ? CHOLECYSTECTOMY    ? ENDOMETRIAL ABLATION  01/11/2008  ? HER OPTION  ? ESOPHAGOGASTRODUODENOSCOPY    ? MULTIPLE TOOTH EXTRACTIONS    ? PORT-A-CATH REMOVAL Left 05/08/2019  ? Procedure: REMOVAL PORT-A-CATH;  Surgeon: Stark Klein, MD;  Location: [facility];  Service: General;  Laterality: Left;  ? PORTACATH PLACEMENT Left 02/23/2018  ? Procedure: INSERTION PORT-A-CATH;  Surgeon: Stark Klein, MD;  Location: [facility];  Service: General;  Laterality: Left;  ? PORTACATH PLACEMENT N/A 04/21/2018  ? Procedure: PORT-A-CATH REVISION;  Surgeon: Stark Klein, MD;  [facility];  Service: General;  Laterality: N/A;  ? TUBAL LIGATION    ? WISDOM TOOTH EXTRACTION    ? ? reports that she has never smoked. She has never used smokeless tobacco. She reports  current alcohol use. She reports that she does not use drugs. ?family history includes Breast cancer in her paternal aunt, paternal aunt, and paternal aunt; Cancer in her father; Colon cancer in her father, paternal grandfather, and another family member; Diabetes in her father and mother; Heart attack (age of onset: 63) in her maternal grandfather; Heart disease in an other family member; Hypertension in her mother; Lung cancer in her paternal aunt; Lung cancer (age of onset: 6) in her maternal grandmother; Prostate cancer in an other family member; Stroke in her paternal uncle and another family member. ?Allergies  ?Allergen Reactions  ? Metformin And Related Other  (See Comments)  ?  Bowel frequency  ? Levaquin [Levofloxacin In D5w] Nausea And Vomiting and Rash  ? Penicillins Swelling and Rash  ?  Has patient had a PCN reaction causing immediate rash, facial/tongue/throat swelling, SOB or lightheadedness with hypotension: Yes ?Has patient had a PCN reaction causing severe rash involving mucus membranes or skin necrosis: No ?Has patient had a PCN reaction that required hospitalization: Yes ?Has patient had a PCN reaction occurring within the last 10 years: No ?If all of the above answers are "NO", then may proceed with Cephalosporin use.  ? ?Current Outpatient Medications on File Prior to Visit  ?Medication Sig Dispense Refill  ? albuterol (VENTOLIN HFA) 108 (90 Base) MCG/ACT inhaler Inhale 2 puffs into the lungs every 6 (six) hours as needed for wheezing or shortness of breath. 1 g 11  ? bictegravir-emtricitabine-tenofovir AF (BIKTARVY) 50-200-25 MG TABS tablet Take 1 tablet by mouth daily.    ? blood glucose meter kit and supplies Dispense based on patient and insurance preference. Use up to four times daily as directed. (FOR ICD-10 E10.9, E11.9). 1 each 0  ? carvedilol (COREG) 6.25 MG tablet TAKE 1 TABLET(6.25 MG) BY MOUTH TWICE DAILY 60 tablet 11  ? Cholecalciferol (THERA-D 2000) 50 MCG (2000 UT) TABS 1 tab by mouth once daily 30 tablet 99  ? colchicine 0.6 MG tablet Take 1 tablet (0.6 mg total) by mouth daily. 90 tablet 3  ? Diclofenac Sodium (PENNSAID) 2 % SOLN Place 1 application onto the skin 2 (two) times daily. 112 g 2  ? Doxepin HCl 3 MG TABS Take 1 tablet (3 mg total) by mouth at bedtime. 30 tablet 3  ? ferrous gluconate (FERGON) 240 (27 FE) MG tablet Take 240 mg by mouth daily as needed (iron).     ? FLUoxetine (PROZAC) 20 MG capsule Take 1 capsule by mouth daily. 90 capsule 3  ? gabapentin (NEURONTIN) 300 MG capsule Take 1 capsule (300 mg total) by mouth at bedtime. 90 capsule 4  ? glipiZIDE (GLUCOTROL XL) 10 MG 24 hr tablet TAKE 1 TABLET(10 MG) BY MOUTH DAILY  WITH BREAKFAST 90 tablet 3  ? hydrocortisone (ANUSOL-HC) 2.5 % rectal cream Place 1 application rectally 2 (two) times daily. 30 g 1  ? ibuprofen (ADVIL,MOTRIN) 800 MG tablet Take 1 tablet (800 mg total) by mouth every 8 (eight) hours as needed. 60 tablet 0  ? Insulin Pen Needle (PEN NEEDLES) 32G X 4 MM MISC Use with insulin pen 100 each 12  ? Lancets (ONETOUCH DELICA PLUS [id number]) MISC USE TO TEST BLOOD SUGAR UP TO 4 TIMES DAILY 100 each 5  ? loratadine (CLARITIN) 10 MG tablet Take 10 mg by mouth daily.    ? losartan (COZAAR) 25 MG tablet Take 1 tablet (25 mg total) by mouth daily. 30 tablet 11  ? Multiple Vitamin (MULTIVITAMIN WITH  MINERALS) TABS tablet Take 1 tablet by mouth daily.    ? nitroGLYCERIN (NITRODUR - DOSED IN MG/24 HR) 0.2 mg/hr patch Apply 1/4 patch daily to tendon for tendonitis. 30 patch 1  ? nystatin-triamcinolone ointment (MYCOLOG) Apply 1 application topically 2 (two) times daily. 30 g 1  ? pioglitazone (ACTOS) 45 MG tablet Take 1 tablet (45 mg total) by mouth daily. 90 tablet 3  ? rosuvastatin (CRESTOR) 10 MG tablet TAKE 1 TABLET(10 MG) BY MOUTH DAILY 90 tablet 1  ? Suvorexant (BELSOMRA) 15 MG TABS Take 1 tablet by mouth at bedtime as needed. 30 tablet 5  ? tamoxifen (NOLVADEX) 20 MG tablet Take 1 tablet (20 mg total) by mouth daily. 90 tablet 4  ? valACYclovir (VALTREX) 500 MG tablet Take twice daily for 3-5 days 30 tablet 2  ? ?No current facility-administered medications on file prior to visit.  ? ?     ROS:  All others reviewed and negative. ? ?Objective  ? ?     PE:  BP 118/68 (BP Location: Left Arm, Patient Position: Sitting, Cuff Size: Large)   Pulse 85   Temp 98.2 ?F (36.8 ?C) (Oral)   Ht _0  (1.575 m)   Wt 246 lb (111.6 kg)   LMP 06/21/2010   SpO2 99%   BMI 44.99 kg/m?  ? ?              Constitutional: Pt appears in NAD ?              HENT: Head: NCAT.  ?              Right Ear: External ear normal.   ?              Left Ear: External ear normal.  ?              Eyes: .  Pupils are equal, round, and reactive to light. Conjunctivae and EOM are normal ?              Nose: without d/c or deformity ?              Neck: Neck supple. Gross normal ROM ?              Cardiovascular: Normal r

## 2021-11-06 NOTE — Patient Instructions (Signed)
Ok to increase the mounjaro to 5 mg per week ? ?Please take all new medication as prescribed  - the dizziness patch for the cruise boat ? ?Please continue all other medications as before, and refills have been done if requested. ? ?Please have the pharmacy call with any other refills you may need. ? ?Please continue your efforts at being more active, low cholesterol diet, and weight control. ? ?Please keep your appointments with your specialists as you may have planned ? ?Please go to the LAB at the blood drawing area for the tests to be done ? ?You will be contacted by phone if any changes need to be made immediately.  Otherwise, you will receive a letter about your results with an explanation, but please check with MyChart first. ? ?Please remember to sign up for MyChart if you have not done so, as this will be important to you in the future with finding out test results, communicating by private email, and scheduling acute appointments online when needed. ? ?Please make an Appointment to return in 6 months, or sooner if needed, also with Lab Appointment for testing done 3-5 days before at the [facility] (so this is for TWO appointments - please see the scheduling desk as you leave) ? ?Due to the ongoing Covid 19 pandemic, our lab now requires an appointment for any labs done at our office.  If you need labs done and do not have an appointment, please call our office ahead of time to schedule before presenting to the lab for your testing. ? ? ? ? ?

## 2021-11-07 ENCOUNTER — Encounter: Payer: Self-pay | Admitting: Internal Medicine

## 2021-11-07 DIAGNOSIS — Z7184 Encounter for health counseling related to travel: Secondary | ICD-10-CM | POA: Insufficient documentation

## 2021-11-07 NOTE — Assessment & Plan Note (Signed)
BP Readings from Last 3 Encounters:  ?11/06/21 118/68  ?08/11/21 130/80  ?07/13/21 136/89  ? ?Stable, pt to continue medical treatment losartan ? ?

## 2021-11-07 NOTE — Assessment & Plan Note (Signed)
D/w pt - ok for scopalmine patch asd, to f/u any worsening symptoms or concerns ?

## 2021-11-07 NOTE — Assessment & Plan Note (Signed)
Lab Results  ?Component Value Date  ? HGBA1C 11.4 (H) 11/06/2021  ? ?uncontrolled, pt to continue current medical treatment actos, glucotrol, but increased mounjaro 5 mg weekly ? ?

## 2021-11-07 NOTE — Assessment & Plan Note (Signed)
Last vitamin D ?Lab Results  ?Component Value Date  ? VD25OH 20.05 (L) 11/06/2021  ? ?Low, to start oral replacement ?

## 2021-11-09 ENCOUNTER — Other Ambulatory Visit: Payer: Self-pay | Admitting: *Deleted

## 2021-11-09 DIAGNOSIS — C50911 Malignant neoplasm of unspecified site of right female breast: Secondary | ICD-10-CM

## 2021-11-09 MED ORDER — TAMOXIFEN CITRATE 20 MG PO TABS: 20.0000 mg | ORAL_TABLET | Freq: Every day | ORAL | 1 refills | Status: DC

## 2021-11-09 NOTE — Telephone Encounter (Signed)
Patient contacted office - requested refill of Tamoxifen. Last appt with [facility], NP in December 2022 and next appt Dr. Chryl Heck 01/14/2022. ?Per Dr. Chryl Heck - ok to refill ?

## 2021-11-10 ENCOUNTER — Other Ambulatory Visit: Payer: Self-pay | Admitting: Internal Medicine

## 2021-11-10 MED ORDER — ROSUVASTATIN CALCIUM 40 MG PO TABS: 40.0000 mg | ORAL_TABLET | Freq: Every day | ORAL | 3 refills | Status: DC

## 2021-11-12 ENCOUNTER — Telehealth: Payer: Self-pay | Admitting: Internal Medicine

## 2021-11-12 MED ORDER — PIOGLITAZONE HCL 45 MG PO TABS: 45.0000 mg | ORAL_TABLET | Freq: Every day | ORAL | 3 refills | Status: DC

## 2021-11-12 NOTE — Telephone Encounter (Signed)
1.Medication Requested: pioglitazone (ACTOS) 45 MG tablet ? ?2. Pharmacy (Name, Street, [facility]): [facility]  [address] [place], [place] [address] ? ?3. On Med List: Y ? ?4. Last Visit with PCP: 11-06-2021 ? ?5. Next visit date with PCP: n/a ? ? ?Agent: Please be advised that RX refills may take up to 3 business days. We ask that you follow-up with your pharmacy.  ?

## 2021-11-17 ENCOUNTER — Telehealth: Payer: Self-pay | Admitting: Hematology and Oncology

## 2021-11-17 NOTE — Telephone Encounter (Signed)
Rescheduled appointment per providers template. Left message.  ? ?

## 2021-11-30 DIAGNOSIS — Z170 Estrogen receptor positive status [ER+]: Secondary | ICD-10-CM | POA: Diagnosis not present

## 2021-11-30 DIAGNOSIS — Z79899 Other long term (current) drug therapy: Secondary | ICD-10-CM | POA: Diagnosis not present

## 2021-11-30 DIAGNOSIS — E119 Type 2 diabetes mellitus without complications: Secondary | ICD-10-CM | POA: Diagnosis not present

## 2021-11-30 DIAGNOSIS — Z7985 Long-term (current) use of injectable non-insulin antidiabetic drugs: Secondary | ICD-10-CM | POA: Diagnosis not present

## 2021-11-30 DIAGNOSIS — C50411 Malignant neoplasm of upper-outer quadrant of right female breast: Secondary | ICD-10-CM | POA: Diagnosis not present

## 2021-11-30 DIAGNOSIS — Z7984 Long term (current) use of oral hypoglycemic drugs: Secondary | ICD-10-CM | POA: Diagnosis not present

## 2021-11-30 DIAGNOSIS — B20 Human immunodeficiency virus [HIV] disease: Secondary | ICD-10-CM | POA: Diagnosis not present

## 2022-01-14 ENCOUNTER — Other Ambulatory Visit: Payer: BC Managed Care – PPO

## 2022-01-14 ENCOUNTER — Telehealth: Payer: Self-pay | Admitting: *Deleted

## 2022-01-14 ENCOUNTER — Ambulatory Visit: Payer: BC Managed Care – PPO | Admitting: Hematology and Oncology

## 2022-01-14 NOTE — Telephone Encounter (Signed)
This RN returned call left by pt late pm 6/21 stating concern due to " excruiating pain across my breast and in my shoulder and abd discomfort " " I don't know what this could be " " I was hoping to see if I could get in before my appt next week "  Obtained identified VM- message left call being returned with request to call again for further discussion for best care.

## 2022-01-18 DIAGNOSIS — Z1159 Encounter for screening for other viral diseases: Secondary | ICD-10-CM | POA: Diagnosis not present

## 2022-01-18 DIAGNOSIS — Z113 Encounter for screening for infections with a predominantly sexual mode of transmission: Secondary | ICD-10-CM | POA: Diagnosis not present

## 2022-01-18 DIAGNOSIS — B20 Human immunodeficiency virus [HIV] disease: Secondary | ICD-10-CM | POA: Diagnosis not present

## 2022-01-18 DIAGNOSIS — Z23 Encounter for immunization: Secondary | ICD-10-CM | POA: Diagnosis not present

## 2022-01-20 ENCOUNTER — Other Ambulatory Visit: Payer: Self-pay

## 2022-01-20 DIAGNOSIS — C50911 Malignant neoplasm of unspecified site of right female breast: Secondary | ICD-10-CM

## 2022-01-21 ENCOUNTER — Inpatient Hospital Stay: Payer: Medicare PPO | Admitting: Hematology and Oncology

## 2022-01-21 ENCOUNTER — Inpatient Hospital Stay: Payer: Medicare PPO | Attending: Hematology and Oncology

## 2022-01-21 ENCOUNTER — Encounter: Payer: Self-pay | Admitting: Hematology and Oncology

## 2022-01-21 ENCOUNTER — Other Ambulatory Visit: Payer: Self-pay

## 2022-01-21 VITALS — BP 133/69 | HR 75 | Temp 97.3°F | Resp 17 | Wt 251.6 lb

## 2022-01-21 DIAGNOSIS — Z801 Family history of malignant neoplasm of trachea, bronchus and lung: Secondary | ICD-10-CM | POA: Diagnosis not present

## 2022-01-21 DIAGNOSIS — Z9049 Acquired absence of other specified parts of digestive tract: Secondary | ICD-10-CM | POA: Insufficient documentation

## 2022-01-21 DIAGNOSIS — E785 Hyperlipidemia, unspecified: Secondary | ICD-10-CM | POA: Diagnosis not present

## 2022-01-21 DIAGNOSIS — Z79810 Long term (current) use of selective estrogen receptor modulators (SERMs): Secondary | ICD-10-CM | POA: Insufficient documentation

## 2022-01-21 DIAGNOSIS — Z881 Allergy status to other antibiotic agents status: Secondary | ICD-10-CM | POA: Insufficient documentation

## 2022-01-21 DIAGNOSIS — Z170 Estrogen receptor positive status [ER+]: Secondary | ICD-10-CM

## 2022-01-21 DIAGNOSIS — Z79899 Other long term (current) drug therapy: Secondary | ICD-10-CM | POA: Diagnosis not present

## 2022-01-21 DIAGNOSIS — Z87442 Personal history of urinary calculi: Secondary | ICD-10-CM | POA: Diagnosis not present

## 2022-01-21 DIAGNOSIS — C50411 Malignant neoplasm of upper-outer quadrant of right female breast: Secondary | ICD-10-CM | POA: Insufficient documentation

## 2022-01-21 DIAGNOSIS — Z833 Family history of diabetes mellitus: Secondary | ICD-10-CM | POA: Insufficient documentation

## 2022-01-21 DIAGNOSIS — Z800 Family history of malignant neoplasm of digestive organs: Secondary | ICD-10-CM | POA: Insufficient documentation

## 2022-01-21 DIAGNOSIS — Z8042 Family history of malignant neoplasm of prostate: Secondary | ICD-10-CM | POA: Insufficient documentation

## 2022-01-21 DIAGNOSIS — Z823 Family history of stroke: Secondary | ICD-10-CM | POA: Diagnosis not present

## 2022-01-21 DIAGNOSIS — Z803 Family history of malignant neoplasm of breast: Secondary | ICD-10-CM | POA: Insufficient documentation

## 2022-01-21 DIAGNOSIS — E1165 Type 2 diabetes mellitus with hyperglycemia: Secondary | ICD-10-CM | POA: Insufficient documentation

## 2022-01-21 DIAGNOSIS — Z8249 Family history of ischemic heart disease and other diseases of the circulatory system: Secondary | ICD-10-CM | POA: Diagnosis not present

## 2022-01-21 DIAGNOSIS — C50911 Malignant neoplasm of unspecified site of right female breast: Secondary | ICD-10-CM

## 2022-01-21 DIAGNOSIS — I10 Essential (primary) hypertension: Secondary | ICD-10-CM | POA: Insufficient documentation

## 2022-01-21 DIAGNOSIS — Z923 Personal history of irradiation: Secondary | ICD-10-CM | POA: Diagnosis not present

## 2022-01-21 DIAGNOSIS — Z880 Allergy status to penicillin: Secondary | ICD-10-CM | POA: Insufficient documentation

## 2022-01-21 LAB — CBC WITH DIFFERENTIAL (CANCER CENTER ONLY)
Abs Immature Granulocytes: 0.02 10*3/uL (ref 0.00–0.07)
Basophils Absolute: 0 10*3/uL (ref 0.0–0.1)
Basophils Relative: 0 %
Eosinophils Absolute: 0 10*3/uL (ref 0.0–0.5)
Eosinophils Relative: 1 %
HCT: 37.6 % (ref 36.0–46.0)
Hemoglobin: 12.4 g/dL (ref 12.0–15.0)
Immature Granulocytes: 1 %
Lymphocytes Relative: 37 %
Lymphs Abs: 1.4 10*3/uL (ref 0.7–4.0)
MCH: 28.3 pg (ref 26.0–34.0)
MCHC: 33 g/dL (ref 30.0–36.0)
MCV: 85.8 fL (ref 80.0–100.0)
Monocytes Absolute: 0.4 10*3/uL (ref 0.1–1.0)
Monocytes Relative: 9 %
Neutro Abs: 2 10*3/uL (ref 1.7–7.7)
Neutrophils Relative %: 52 %
Platelet Count: 233 10*3/uL (ref 150–400)
RBC: 4.38 MIL/uL (ref 3.87–5.11)
RDW: 13.7 % (ref 11.5–15.5)
WBC Count: 3.9 10*3/uL — ABNORMAL LOW (ref 4.0–10.5)
nRBC: 0 % (ref 0.0–0.2)

## 2022-01-21 LAB — CMP (CANCER CENTER ONLY)
ALT: 29 U/L (ref 0–44)
AST: 20 U/L (ref 15–41)
Albumin: 4 g/dL (ref 3.5–5.0)
Alkaline Phosphatase: 106 U/L (ref 38–126)
Anion gap: 6 (ref 5–15)
BUN: 13 mg/dL (ref 6–20)
CO2: 28 mmol/L (ref 22–32)
Calcium: 9.4 mg/dL (ref 8.9–10.3)
Chloride: 102 mmol/L (ref 98–111)
Creatinine: 0.73 mg/dL (ref 0.44–1.00)
GFR, Estimated: >60 mL/min (ref >60)
Glucose, Bld: 150 mg/dL — ABNORMAL HIGH (ref 70–99)
Potassium: 4 mmol/L (ref 3.5–5.1)
Sodium: 136 mmol/L (ref 135–145)
Total Bilirubin: 0.3 mg/dL (ref 0.3–1.2)
Total Protein: 7.2 g/dL (ref 6.5–8.1)

## 2022-01-21 LAB — SAVE SMEAR(SSMR), FOR PROVIDER SLIDE REVIEW

## 2022-01-21 NOTE — Progress Notes (Signed)
Kelly Rowe  [phone] [phone]) [phone]    ID: Kelly Rowe DOB: 28-Dec-1970  MR#: [id number]  UOH#:[id number]  Patient Care Team: Biagio Borg, MD as PCP - Kelly Citizen, MD as Consulting Physician (General Surgery) [name], MD as Attending Physician (Radiation Oncology) Marlinda Mike, PA-C as Referring Physician (Physician Assistant) Gregor Hams, MD as Consulting Physician (Family Medicine)   CHIEF COMPLAINT: Triple positive breast cancer  CURRENT TREATMENT:  tamoxifen   INTERVAL HISTORY:  Brandilyn returns today for follow-up of her triple positive breast cancer. She is doing quite well on tamoxifen.  She used to have yeast infection and this was thought to be secondary to her poorly controlled diabetes.  She is now on [facility] for diabetes and her last hemoglobin A1c is 10.5.  She states this is significantly better compared to hemoglobin A1c of 12.  HIV is well controlled, she is taking all her retroviral therapy. She says last week she had diffuse pains in the breast, shoulders last week and she was wondering if this could be something concerning but since then the pain has completely resolved.  Rest of the pertinent 10 point ROS reviewed and negative   REVIEW OF SYSTEMS: Review of Systems  Constitutional:  Negative for appetite change, chills, fatigue, fever and unexpected weight change.  HENT:   Negative for hearing loss, lump/mass and trouble swallowing.   Eyes:  Negative for eye problems and icterus.  Respiratory:  Negative for chest tightness, cough and shortness of breath.   Cardiovascular:  Negative for chest pain, leg swelling and palpitations.  Gastrointestinal:  Negative for abdominal distention, abdominal pain, constipation, diarrhea, nausea and vomiting.  Endocrine: Negative for hot flashes.  Genitourinary:  Negative for difficulty urinating.   Musculoskeletal:  Negative for arthralgias.  Skin:  Negative  for itching and rash.  Neurological:  Negative for dizziness, extremity weakness, headaches and numbness.  Hematological:  Negative for adenopathy. Does not bruise/bleed easily.  Psychiatric/Behavioral:  Negative for depression. The patient is not nervous/anxious.       COVID 19 VACCINATION STATUS: Refuses vaccination, had COVID December 2021   HISTORY OF CURRENT ILLNESS: From the original intake note:  "[name]" noticed bruising in her lateral right breast and palpated a mass  on 12/23/2017. She followed up with her gynecologist. She underwent unilateral right diagnostic mammography with tomography and right breast ultrasonography at [facility] on 01/05/2018 showing: breast density category B. There is an irregular highly suspicious mass within the right breast at the 9:30 o'clock upper outer quadrant, measuring 1.8 x 1.1 x 1.5 cm, and located 18 cm from the nipple,  Ultrasonography revealed a single morphologically abnormal lymph node in the RIGHT axilla, with cortical thickness of 6 mm.   Accordingly on 01/11/2018 she proceeded to biopsy of the right breast area in question as well as a suspicious lymph node. The pathology from this procedure showed ([id number]): Invasive ductal carcinoma grade III.  The lymph node biopsied was negative for carcinoma (concordant).. Prognostic indicators significant for: estrogen receptor, 60% positive with weak staining intensity and progesterone receptor, 10% positive with strong staining intensity. Proliferation marker Ki67 at 30%. HER2 amplified with ratios HER2/CEP17 signals 2.26 and average HER2 copies per cell 3.50  The patient's subsequent history is as detailed below.   PAST MEDICAL HISTORY: Past Medical History:  Diagnosis Date   ANEMIA-IRON DEFICIENCY 01/30/2010   ANXIETY 11/27/2007   Arthritis    Asthma 02/25/2011   Cancer ([facility])  breast   Carbuncle and furuncle of trunk 04/16/2010   Chlamydia infection 03/21/2008   DIABETES MELLITUS,  TYPE II 08/02/2007   Edema 01/30/2010   ELEVATED BLOOD PRESSURE WITHOUT DIAGNOSIS OF HYPERTENSION 08/02/2007   Family history of breast cancer    Family history of colon cancer    Family history of lung cancer    FREQUENCY, URINARY 05/19/2009   GENITAL HERPES 03/12/2009   GERD (gastroesophageal reflux disease)    History of kidney stones    History of radiation therapy 07/27/18- 09/06/18   Right Breast, Right Axillary and Supraclavicular nodes 50 Gy in 25 fractions, Right Breast Boost 10 Gy in 5 fractions   HIV (human immunodeficiency virus infection) ([place]) 2009   HIV INFECTION 03/12/2009   HSV (herpes simplex virus) infection    HYPERLIPIDEMIA 08/02/2007   Hypertension    Metrorrhagia 03/21/2008   PONV (postoperative nausea and vomiting)    woke up crying per patient    RETENTION, URINE 05/19/2009   Sleep apnea    Trichomonas infection 01/19/2010   VITAMIN D DEFICIENCY 01/30/2010    PAST SURGICAL HISTORY: Past Surgical History:  Procedure Laterality Date   ABDOMINAL HYSTERECTOMY  07/22/2010   TAH WITH PRESERVATION OF BOTH TUBES AND OVARIES   BREAST LUMPECTOMY Right 2019   BREAST LUMPECTOMY WITH RADIOACTIVE SEED AND SENTINEL LYMPH NODE BIOPSY Right 02/23/2018   Procedure: BREAST LUMPECTOMY WITH RADIOACTIVE SEED AND SENTINEL LYMPH NODE BIOPSY;  Surgeon: Stark Klein, MD;  Location: [facility];  Service: General;  Laterality: Right;   CHOLECYSTECTOMY     ENDOMETRIAL ABLATION  01/11/2008   HER OPTION   ESOPHAGOGASTRODUODENOSCOPY     MULTIPLE TOOTH EXTRACTIONS     PORT-A-CATH REMOVAL Left 05/08/2019   Procedure: REMOVAL PORT-A-CATH;  Surgeon: Stark Klein, MD;  Location: [facility];  Service: General;  Laterality: Left;   PORTACATH PLACEMENT Left 02/23/2018   Procedure: INSERTION PORT-A-CATH;  Surgeon: Stark Klein, MD;  Location: [facility];  Service: General;  Laterality: Left;   PORTACATH PLACEMENT N/A 04/21/2018   Procedure: PORT-A-CATH REVISION;  Surgeon: Stark Klein, MD;  Location:  [facility];  Service: General;  Laterality: N/A;   TUBAL LIGATION     WISDOM TOOTH EXTRACTION      FAMILY HISTORY: Family History  Problem Relation Age of Onset   Prostate cancer Other    Heart disease Other    Stroke Other    Diabetes Mother    Hypertension Mother    Diabetes Father    Cancer Father        COLON and LU NG   Colon cancer Father    Stroke Paternal Uncle    Breast cancer Paternal Aunt    Lung cancer Maternal Grandmother 45       Mesothelioma   Colon cancer Paternal Grandfather        dx over 27s   Lung cancer Paternal Aunt    Breast cancer Paternal Aunt    Breast cancer Paternal Aunt    Heart attack Maternal Grandfather 71   Colon cancer Other        MGM's 5 brothers  The patient's father died at age 49 due to metastatic liver cancer. The patient's mother is alive at 20. The patient's has 1 brother and 3 sisters. There was a paternal grandfather with colon cancer. There were 3 paternal aunts with breast cancer, 2 diagnosed in the 1's and 1 at age 28. There was a maternal grandmother with mesothelioma. The patient otherwise denies  a history of ovarian cancer in the family.    GYNECOLOGIC HISTORY:  Patient's last menstrual period was 06/21/2010. Menarche: 51 years old Age at first live birth: 51 years old She is GXP5. Her LMP was December 2011. She is status post partial hysterectomy without oophorectomy. She took oral contraception for 3 years with no complications. She never took HRT.    SOCIAL HISTORY: (Updated January 2022). 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  She is currently applying for disability.  At home are 2 of her sons, Glendell Docker and [name], and the patient's grandson, Amador Cunas  (who is Willie's son). The patient's oldest is Izell Sanderson who works in [employer] as a Training and development officer. Daughter, Jordan Hawks  works as a Scientist, water quality. Son, Glendell Docker is disabled. Son, [name] works in [employer] as a Microbiologist. Daughter, Benard Rink also is a Microbiologist. The patient has 5 grandchildren  and no great grandchildren. The patient does not belong to a church.    ADVANCED DIRECTIVES: Not in place; at the 01/18/2018 visit the patient was given the appropriate documents to complete on notarized at her discretion   HEALTH MAINTENANCE: Social History   Tobacco Use   Smoking status: Never   Smokeless tobacco: Never  Vaping Use   Vaping Use: Never used  Substance Use Topics   Alcohol use: Yes    Alcohol/week: 0.0 standard drinks of alcohol    Comment: occ   Drug use: No     Colonoscopy: Not yet  PAP: November 2018  Bone density: Never   Allergies  Allergen Reactions   Metformin And Related Other (See Comments)    Bowel frequency   Levaquin [Levofloxacin In D5w] Nausea And Vomiting and Rash   Penicillins Swelling and Rash    Has patient had a PCN reaction causing immediate rash, facial/tongue/throat swelling, SOB or lightheadedness with hypotension: Yes Has patient had a PCN reaction causing severe rash involving mucus membranes or skin necrosis: No Has patient had a PCN reaction that required hospitalization: Yes Has patient had a PCN reaction occurring within the last 10 years: No If all of the above answers are "NO", then may proceed with Cephalosporin use.    Current Outpatient Medications  Medication Sig Dispense Refill   albuterol (VENTOLIN HFA) 108 (90 Base) MCG/ACT inhaler Inhale 2 puffs into the lungs every 6 (six) hours as needed for wheezing or shortness of breath. 1 g 11   bictegravir-emtricitabine-tenofovir AF (BIKTARVY) 50-200-25 MG TABS tablet Take 1 tablet by mouth daily.     blood glucose meter kit and supplies Dispense based on patient and insurance preference. Use up to four times daily as directed. (FOR ICD-10 E10.9, E11.9). 1 each 0   carvedilol (COREG) 6.25 MG tablet TAKE 1 TABLET(6.25 MG) BY MOUTH TWICE DAILY 60 tablet 11   Cholecalciferol (THERA-D 2000) 50 MCG (2000 UT) TABS 1 tab by mouth once daily 30 tablet 99   colchicine 0.6 MG tablet Take  1 tablet (0.6 mg total) by mouth daily. 90 tablet 3   Diclofenac Sodium (PENNSAID) 2 % SOLN Place 1 application onto the skin 2 (two) times daily. 112 g 2   Doxepin HCl 3 MG TABS Take 1 tablet (3 mg total) by mouth at bedtime. 30 tablet 3   ferrous gluconate (FERGON) 240 (27 FE) MG tablet Take 240 mg by mouth daily as needed (iron).      FLUoxetine (PROZAC) 20 MG capsule Take 1 capsule by mouth daily. 90 capsule 3   gabapentin (NEURONTIN) 300 MG  capsule Take 1 capsule (300 mg total) by mouth at bedtime. 90 capsule 4   glipiZIDE (GLUCOTROL XL) 10 MG 24 hr tablet TAKE 1 TABLET(10 MG) BY MOUTH DAILY WITH BREAKFAST 90 tablet 3   glucose blood (ONETOUCH VERIO) test strip USE 1 STRIP TO CHECK GLUCOSE 4 TIMES DAILY AS DIRECTED. DX: E11.69 100 each 11   hydrocortisone (ANUSOL-HC) 2.5 % rectal cream Place 1 application rectally 2 (two) times daily. 30 g 1   ibuprofen (ADVIL,MOTRIN) 800 MG tablet Take 1 tablet (800 mg total) by mouth every 8 (eight) hours as needed. 60 tablet 0   Insulin Pen Needle (PEN NEEDLES) 32G X 4 MM MISC Use with insulin pen 100 each 12   Lancets (ONETOUCH DELICA PLUS [id number]) MISC USE TO TEST BLOOD SUGAR UP TO 4 TIMES DAILY 100 each 5   loratadine (CLARITIN) 10 MG tablet Take 10 mg by mouth daily.     losartan (COZAAR) 25 MG tablet Take 1 tablet (25 mg total) by mouth daily. 30 tablet 11   Multiple Vitamin (MULTIVITAMIN WITH MINERALS) TABS tablet Take 1 tablet by mouth daily.     nitroGLYCERIN (NITRODUR - DOSED IN MG/24 HR) 0.2 mg/hr patch Apply 1/4 patch daily to tendon for tendonitis. 30 patch 1   nystatin-triamcinolone ointment (MYCOLOG) Apply 1 application topically 2 (two) times daily. 30 g 1   pioglitazone (ACTOS) 45 MG tablet Take 1 tablet (45 mg total) by mouth daily. 90 tablet 3   rosuvastatin (CRESTOR) 40 MG tablet Take 1 tablet (40 mg total) by mouth daily. 90 tablet 3   scopolamine (TRANSDERM-SCOP) 1 MG/3DAYS Place 1 patch (1.5 mg total) onto the skin every 3 (three)  days. 10 patch 12   Suvorexant (BELSOMRA) 15 MG TABS Take 1 tablet by mouth at bedtime as needed. 30 tablet 5   tamoxifen (NOLVADEX) 20 MG tablet Take 1 tablet (20 mg total) by mouth daily. 90 tablet 1   tirzepatide (MOUNJARO) 5 MG/0.5ML Pen Inject 5 mg into the skin once a week. 6 mL 11   valACYclovir (VALTREX) 500 MG tablet Take twice daily for 3-5 days 30 tablet 2   No current facility-administered medications for this visit.    OBJECTIVE:  Vitals:   01/21/22 1149  BP: 133/69  Pulse: 75  Resp: 17  Temp: (!) 97.3 F (36.3 C)  SpO2: 99%      Body mass index is 46.01 kg/m.   Wt Readings from Last 3 Encounters:  01/21/22 251 lb 9 oz (114.1 kg)  11/06/21 246 lb (111.6 kg)  08/11/21 251 lb (113.9 kg)  ECOG FS: 2 - Symptomatic, <50% confined to bed  Physical Exam Constitutional:      Appearance: Normal appearance.  Cardiovascular:     Rate and Rhythm: Normal rate and regular rhythm.  Pulmonary:     Effort: Pulmonary effort is normal.     Breath sounds: Normal breath sounds.  Chest:     Comments: Bilateral breasts inspected.  She has large pendulous breast.  Right breast with postop and postradiation changes, no significant change since completion of treatment.  No new palpable masses.  Left breast normal to inspection.  No new palpable masses.  No regional adenopathy. Musculoskeletal:     Cervical back: Normal range of motion and neck supple. No rigidity.  Lymphadenopathy:     Cervical: No cervical adenopathy.  Neurological:     Mental Status: She is alert.       LAB RESULTS:  CMP  Component Value Date/Time   NA 134 (L) 11/06/2021 0925   K 3.6 11/06/2021 0925   CL 98 11/06/2021 0925   CO2 27 11/06/2021 0925   GLUCOSE 219 (H) 11/06/2021 0925   BUN 12 11/06/2021 0925   CREATININE 0.67 11/06/2021 0925   CREATININE 1.04 (H) 06/18/2020 1202   CALCIUM 9.1 11/06/2021 0925   PROT 6.8 11/06/2021 0925   ALBUMIN 4.0 11/06/2021 0925   AST 17 11/06/2021 0925   AST  29 06/18/2020 1202   ALT 17 11/06/2021 0925   ALT 26 06/18/2020 1202   ALKPHOS 132 (H) 11/06/2021 0925   BILITOT 0.2 11/06/2021 0925   BILITOT 0.5 06/18/2020 1202   GFRNONAA >60 07/13/2021 1021   GFRNONAA >60 06/18/2020 1202   GFRAA >60 04/16/2020 1119   GFRAA >60 10/11/2019 1054    No results found for: "TOTALPROTELP", "ALBUMINELP", "A1GS", "A2GS", "BETS", "BETA2SER", "GAMS", "MSPIKE", "SPEI"  No results found for: "KPAFRELGTCHN", "LAMBDASER", "KAPLAMBRATIO"  Lab Results  Component Value Date   WBC 3.9 (L) 01/21/2022   NEUTROABS 2.0 01/21/2022   HGB 12.4 01/21/2022   HCT 37.6 01/21/2022   MCV 85.8 01/21/2022   PLT 233 01/21/2022   No results found for: "LABCA2"  No components found for: "WHQPRF163"  No results for input(s): "INR" in the last 168 hours.  No results found for: "LABCA2"  No results found for: "WGY659"  No results found for: "CAN125"  No results found for: "CAN153"  No results found for: "CA2729"  No components found for: "HGQUANT"  No results found for: "CEA1", "CEA" / No results found for: "CEA1", "CEA"   No results found for: "AFPTUMOR"  No results found for: "CHROMOGRNA"  No results found for: "HGBA", "HGBA2QUANT", "HGBFQUANT", "HGBSQUAN" (Hemoglobinopathy evaluation)   No results found for: "LDH"  Lab Results  Component Value Date   IRON 75 10/03/2019   IRONPCTSAT 20.9 10/03/2019   (Iron and TIBC)  No results found for: "FERRITIN"  Urinalysis    Component Value Date/Time   COLORURINE Dark Yellow (A) 11/06/2021 0925   APPEARANCEUR CLEAR 11/06/2021 0925   LABSPEC >=1.030 (A) 11/06/2021 0925   PHURINE 6.0 11/06/2021 0925   GLUCOSEU NEGATIVE 11/06/2021 0925   HGBUR NEGATIVE 11/06/2021 0925   BILIRUBINUR NEGATIVE 11/06/2021 0925   KETONESUR TRACE (A) 11/06/2021 0925   PROTEINUR NEGATIVE 03/10/2021 1036   UROBILINOGEN 0.2 11/06/2021 0925   NITRITE NEGATIVE 11/06/2021 0925   LEUKOCYTESUR MODERATE (A) 11/06/2021 0925     STUDIES: No results found.   ELIGIBLE FOR AVAILABLE RESEARCH PROTOCOL: no   ASSESSMENT: 51 y.o. [place], [name] woman status post right breast upper outer quadrant biopsy 01/11/2018 for a clinical T1 N0, stage IA invasive ductal carcinoma, grade 3, estrogen and progesterone receptor positive, HER-2 amplified, with an MIB-1 of 30%.  (1) status post right lumpectomy and sentinel lymph node sampling 02/23/2018 for a pT2 pN1, stage IB invasive ductal carcinoma, grade 3, with close but negative margins  (2) adjuvant chemotherapy consisting of carboplatin, docetaxel, trastuzumab and Pertuzumab every 21 days x 6 given between 03/08/2018-07/05/2018 (a) pertuzumab stopped after cycle 1 due to diarrhea (b) docetaxel changed to gemcitabine after cycle 2 due to persistent hyperglycemia (c) Gemcitabine and Carboplatin dose reduced by approximately 10% due to delayed neutropenia and thrombocytopenia  (3) anti-HER-2 immunotherapy started concurrently with chemotherapy (a) baseline echocardiogram on 02/07/2018 showed an ejection fraction in the 55-60% range (b) repeat echo 06/19/2018 showed an ejection fraction of 55-60% (c) repeat echocardiogram 11/14/2018 showed an EF in the  50-55% range, with normal strain. (d) echo 01/31/2019 shows an ejection fraction in the 55/60% range (e) final trastuzumab dose 03/12/2019  (4) adjuvant radiation 07/27/2018 - 09/06/2018  Site/dose: 1. Right Breast / 50 Gy in 25 fractions    2. Right Supraclavicular nodes / 50 Gy in 25 fractions    3. Right Breast Boost / 10 Gy in 5 fractions  (5) tamoxifen started November 2020  (6) genetics testing through [facility] Multi-cancer and Breast panel on 01/13/2018 showed: no deleterious mutations. The following genes were evaluated for sequence changes and exonic deletions/duplications: ALK, APC, ATM, AXIN2, BAP1, BARD1, BLM, BMPR1A, BRCA1, BRCA2, BRIP1, CASR, CDC73, CDH1, CDK4, CDKN1B, CDKN1C,CDKN2A (p14ARF), CDKN2A  (p16INK4a), CEBPA, CHEK2, CTNNA1, DICER1, DIS3L2, EPCAM*, FH, FLCN, GATA2, GPC3, GREM1*, HRAS, KIT, MAX, MEN1, MET, MLH1, MSH2, MSH3, MSH6, MUTYH, NBN, NF1, NF2, PALB2, PDGFRA, PHOX2B*, PMS2, POLD1,POLE, POT1, PRKAR1A, PTCH1, PTEN, RAD50, RAD51C, RAD51D, RB1, RECQL4, RET, RUNX1, SDHAF2, SDHB, SDHC, SDHD,SMAD4, SMARCA4, SMARCB1, SMARCE1, STK11, SUFU, TERC, TERT, TMEM127, TP53, TSC1, TSC2, VHL, WRN*, WT1.The following genes were evaluated for sequence changes only: EGFR*, HOXB13*, MITF*, NTHL1*, SDHA  (7) Mammogram December 2022 with no evidence of malignancy.   PLAN:  Tena is here for follow-up of her estrogen positive breast cancer.  She has no clinical or radiographic sign of breast cancer recurrence.  She continues on tamoxifen with good tolerance.   She had some arthralgias last week which have spontaneously resolved.  She otherwise denies any changes in her breast.   She is due for screening mammogram in December 2023.  Encouraged her to stay active and Be compliant with all her medications.  We have discussed about her hemoglobin A1c of 10.5, patient is working with her diabetes doctor and is now on [facility] for management of diabetes.   Total encounter time 20 minutes.*In face-to-face visit time, chart review, lab review, order entry, care coordination, and documentation of the encounter.  *Total Encounter Time as defined by the Centers for Medicare and Medicaid Services includes, in addition to the face-to-face time of a patient visit (documented in the note above) non-face-to-face time: obtaining and reviewing outside history, ordering and reviewing medications, tests or procedures, care coordination (communications with other health care professionals or caregivers) and documentation in the medical record.

## 2022-04-27 ENCOUNTER — Ambulatory Visit: Payer: Medicare PPO | Admitting: Internal Medicine

## 2022-04-30 ENCOUNTER — Encounter: Payer: Self-pay | Admitting: Internal Medicine

## 2022-04-30 ENCOUNTER — Ambulatory Visit: Payer: Medicare PPO | Admitting: Internal Medicine

## 2022-04-30 VITALS — BP 124/76 | HR 87 | Temp 98.3°F | Ht 62.0 in | Wt 261.0 lb

## 2022-04-30 DIAGNOSIS — J069 Acute upper respiratory infection, unspecified: Secondary | ICD-10-CM | POA: Diagnosis not present

## 2022-04-30 DIAGNOSIS — I10 Essential (primary) hypertension: Secondary | ICD-10-CM | POA: Diagnosis not present

## 2022-04-30 DIAGNOSIS — U071 COVID-19: Secondary | ICD-10-CM

## 2022-04-30 DIAGNOSIS — E559 Vitamin D deficiency, unspecified: Secondary | ICD-10-CM | POA: Diagnosis not present

## 2022-04-30 LAB — POC SOFIA SARS ANTIGEN FIA: SARS Coronavirus 2 Ag: POSITIVE — AB

## 2022-04-30 MED ORDER — HYDROCODONE BIT-HOMATROP MBR 5-1.5 MG/5ML PO SOLN: 5.0000 mL | Freq: Four times a day (QID) | ORAL | 0 refills | Status: AC | PRN

## 2022-04-30 MED ORDER — NIRMATRELVIR/RITONAVIR (PAXLOVID)TABLET: 3.0000 | ORAL_TABLET | Freq: Two times a day (BID) | ORAL | 0 refills | Status: AC

## 2022-04-30 MED ORDER — TRIAMCINOLONE ACETONIDE 55 MCG/ACT NA AERO: 2.0000 | INHALATION_SPRAY | Freq: Every day | NASAL | 12 refills | Status: AC

## 2022-04-30 MED ORDER — ALBUTEROL SULFATE HFA 108 (90 BASE) MCG/ACT IN AERS: 2.0000 | INHALATION_SPRAY | Freq: Four times a day (QID) | RESPIRATORY_TRACT | 11 refills | Status: DC | PRN

## 2022-04-30 NOTE — Assessment & Plan Note (Signed)
Recent onset symptoms in high risk patient, with URI symptoms and cough, now for paxlovid course, cough med prn, albuterol hfa inhaler prn refill, and  to f/u any worsening symptoms or concerns

## 2022-04-30 NOTE — Patient Instructions (Addendum)
Your COVID testing was done today - Positive  Please take all new medication as prescribed - the antibiotic paxlovid and cough medicine as needed  Please continue all other medications as before, and refills have been done if requested.  Please have the pharmacy call with any other refills you may need.  Please continue your efforts at being more active, low cholesterol diet, and weight control.  Please keep your appointments with your specialists as you may have planned

## 2022-04-30 NOTE — Assessment & Plan Note (Signed)
Last vitamin D Lab Results  Component Value Date   VD25OH 20.05 (L) 11/06/2021   Low, reminded to start oral replacement

## 2022-04-30 NOTE — Assessment & Plan Note (Signed)
BP Readings from Last 3 Encounters:  04/30/22 124/76  01/21/22 133/69  11/06/21 118/68   Stable, pt to continue medical treatment coreg 6.25 mg bid, hct 12.5 mg qd, losartan 25 mg qd

## 2022-04-30 NOTE — Progress Notes (Signed)
Patient ID: Kelly Rowe, female   DOB: 1971-07-26, 51 y.o.   MRN: [id number]        Chief Complaint: follow up URI symptoms       HPI:  Kelly Rowe is a 51 y.o. female here  Here with 2-3 days acute onset fever, facial pain, pressure, headache, general weakness and malaise, with mod ST and cough, but pt denies chest pain, wheezing, increased sob or doe, orthopnea, PND, increased LE swelling, palpitations, dizziness or syncope.  Pt denies polydipsia, polyuria, or new focal neuro s/s.    Pt denies fever, wt loss, night sweats, loss of appetite, or other constitutional symptoms         Wt Readings from Last 3 Encounters:  04/30/22 261 lb (118.4 kg)  01/21/22 251 lb 9 oz (114.1 kg)  11/06/21 246 lb (111.6 kg)   BP Readings from Last 3 Encounters:  04/30/22 124/76  01/21/22 133/69  11/06/21 118/68         Past Medical History:  Diagnosis Date   ANEMIA-IRON DEFICIENCY 01/30/2010   ANXIETY 11/27/2007   Arthritis    Asthma 02/25/2011   Cancer ([place])    breast   Carbuncle and furuncle of trunk 04/16/2010   Chlamydia infection 03/21/2008   DIABETES MELLITUS, TYPE II 08/02/2007   Edema 01/30/2010   ELEVATED BLOOD PRESSURE WITHOUT DIAGNOSIS OF HYPERTENSION 08/02/2007   Family history of breast cancer    Family history of colon cancer    Family history of lung cancer    FREQUENCY, URINARY 05/19/2009   GENITAL HERPES 03/12/2009   GERD (gastroesophageal reflux disease)    History of kidney stones    History of radiation therapy 07/27/18- 09/06/18   Right Breast, Right Axillary and Supraclavicular nodes 50 Gy in 25 fractions, Right Breast Boost 10 Gy in 5 fractions   HIV (human immunodeficiency virus infection) ([place]) 2009   HIV INFECTION 03/12/2009   HSV (herpes simplex virus) infection    HYPERLIPIDEMIA 08/02/2007   Hypertension    Metrorrhagia 03/21/2008   PONV (postoperative nausea and vomiting)    woke up crying per patient    RETENTION, URINE 05/19/2009   Sleep apnea     Trichomonas infection 01/19/2010   VITAMIN D DEFICIENCY 01/30/2010   Past Surgical History:  Procedure Laterality Date   ABDOMINAL HYSTERECTOMY  07/22/2010   TAH WITH PRESERVATION OF BOTH TUBES AND OVARIES   BREAST LUMPECTOMY Right 2019   BREAST LUMPECTOMY WITH RADIOACTIVE SEED AND SENTINEL LYMPH NODE BIOPSY Right 02/23/2018   Procedure: BREAST LUMPECTOMY WITH RADIOACTIVE SEED AND SENTINEL LYMPH NODE BIOPSY;  Surgeon: Stark Klein, MD;  Location: [facility];  Service: General;  Laterality: Right;   CHOLECYSTECTOMY     ENDOMETRIAL ABLATION  01/11/2008   HER OPTION   ESOPHAGOGASTRODUODENOSCOPY     MULTIPLE TOOTH EXTRACTIONS     PORT-A-CATH REMOVAL Left 05/08/2019   Procedure: REMOVAL PORT-A-CATH;  Surgeon: Stark Klein, MD;  Location: [facility];  Service: General;  Laterality: Left;   PORTACATH PLACEMENT Left 02/23/2018   Procedure: INSERTION PORT-A-CATH;  Surgeon: Stark Klein, MD;  Location: [facility];  Service: General;  Laterality: Left;   PORTACATH PLACEMENT N/A 04/21/2018   Procedure: PORT-A-CATH REVISION;  Surgeon: Stark Klein, MD;  Location: [facility];  Service: General;  Laterality: N/A;   TUBAL LIGATION     WISDOM TOOTH EXTRACTION      reports that she has never smoked. She has never used smokeless tobacco. She reports current alcohol use.  She reports that she does not use drugs. family history includes Breast cancer in her paternal aunt, paternal aunt, and paternal aunt; Cancer in her father; Colon cancer in her father, paternal grandfather, and another family member; Diabetes in her father and mother; Heart attack (age of onset: 62) in her maternal grandfather; Heart disease in an other family member; Hypertension in her mother; Lung cancer in her paternal aunt; Lung cancer (age of onset: 85) in her maternal grandmother; Prostate cancer in an other family member; Stroke in her paternal uncle and another family member. Allergies  Allergen Reactions   Metformin And Related Other  (See Comments)    Bowel frequency   Levaquin [Levofloxacin In D5w] Nausea And Vomiting and Rash   Penicillins Swelling and Rash    Has patient had a PCN reaction causing immediate rash, facial/tongue/throat swelling, SOB or lightheadedness with hypotension: Yes Has patient had a PCN reaction causing severe rash involving mucus membranes or skin necrosis: No Has patient had a PCN reaction that required hospitalization: Yes Has patient had a PCN reaction occurring within the last 10 years: No If all of the above answers are "NO", then may proceed with Cephalosporin use.   Current Outpatient Medications on File Prior to Visit  Medication Sig Dispense Refill   Beclomethasone Dipropionate 80 MCG/ACT AERS Place into the nose.     prochlorperazine (COMPAZINE) 10 MG tablet Take by mouth.     zolpidem (AMBIEN) 10 MG tablet Take by mouth.     ALPRAZolam (XANAX) 0.25 MG tablet Take by mouth.     bictegravir-emtricitabine-tenofovir AF (BIKTARVY) 50-200-25 MG TABS tablet Take 1 tablet by mouth daily.     blood glucose meter kit and supplies Dispense based on patient and insurance preference. Use up to four times daily as directed. (FOR ICD-10 E10.9, E11.9). 1 each 0   carvedilol (COREG) 6.25 MG tablet TAKE 1 TABLET(6.25 MG) BY MOUTH TWICE DAILY 60 tablet 11   Cholecalciferol (THERA-D 2000) 50 MCG (2000 UT) TABS 1 tab by mouth once daily 30 tablet 99   colchicine 0.6 MG tablet Take 1 tablet (0.6 mg total) by mouth daily. 90 tablet 3   Diclofenac Sodium (PENNSAID) 2 % SOLN Place 1 application onto the skin 2 (two) times daily. 112 g 2   Doxepin HCl 3 MG TABS Take 1 tablet (3 mg total) by mouth at bedtime. 30 tablet 3   Ferrous Fumarate (HEMOCYTE - 106 MG FE) 324 (106 Fe) MG TABS tablet Take by mouth.     ferrous gluconate (FERGON) 240 (27 FE) MG tablet Take 240 mg by mouth daily as needed (iron).      FLUoxetine (PROZAC) 20 MG capsule Take 1 capsule by mouth daily. 90 capsule 3   gabapentin (NEURONTIN)  300 MG capsule Take 1 capsule (300 mg total) by mouth at bedtime. 90 capsule 4   glipiZIDE (GLUCOTROL XL) 10 MG 24 hr tablet TAKE 1 TABLET(10 MG) BY MOUTH DAILY WITH BREAKFAST 90 tablet 3   glucose blood (ONETOUCH VERIO) test strip USE 1 STRIP TO CHECK GLUCOSE 4 TIMES DAILY AS DIRECTED. DX: E11.69 100 each 11   hydrochlorothiazide (MICROZIDE) 12.5 MG capsule Take 1 capsule by mouth daily.     hydrocortisone (ANUSOL-HC) 2.5 % rectal cream Place 1 application rectally 2 (two) times daily. 30 g 1   ibuprofen (ADVIL,MOTRIN) 800 MG tablet Take 1 tablet (800 mg total) by mouth every 8 (eight) hours as needed. 60 tablet 0   Insulin Pen Needle (PEN  NEEDLES) 32G X 4 MM MISC Use with insulin pen 100 each 12   Lancets (ONETOUCH DELICA PLUS [id number]) MISC USE TO TEST BLOOD SUGAR UP TO 4 TIMES DAILY 100 each 5   loratadine (CLARITIN) 10 MG tablet Take 10 mg by mouth daily.     losartan (COZAAR) 25 MG tablet Take 1 tablet (25 mg total) by mouth daily. 30 tablet 11   Multiple Vitamin (MULTIVITAMIN WITH MINERALS) TABS tablet Take 1 tablet by mouth daily.     Multiple Vitamins-Minerals (CENTRUM ADULT PO) Take 1 tablet by mouth daily.     nitroGLYCERIN (NITRODUR - DOSED IN MG/24 HR) 0.2 mg/hr patch Apply 1/4 patch daily to tendon for tendonitis. 30 patch 1   nystatin-triamcinolone ointment (MYCOLOG) Apply 1 application topically 2 (two) times daily. 30 g 1   pioglitazone (ACTOS) 45 MG tablet Take 1 tablet (45 mg total) by mouth daily. 90 tablet 3   rosuvastatin (CRESTOR) 40 MG tablet Take 1 tablet (40 mg total) by mouth daily. 90 tablet 3   scopolamine (TRANSDERM-SCOP) 1 MG/3DAYS Place 1 patch (1.5 mg total) onto the skin every 3 (three) days. 10 patch 12   Suvorexant (BELSOMRA) 15 MG TABS Take 1 tablet by mouth at bedtime as needed. 30 tablet 5   tamoxifen (NOLVADEX) 20 MG tablet Take 1 tablet (20 mg total) by mouth daily. 90 tablet 1   tirzepatide (MOUNJARO) 5 MG/0.5ML Pen Inject 5 mg into the skin once a week.  6 mL 11   valACYclovir (VALTREX) 500 MG tablet Take twice daily for 3-5 days 30 tablet 2   No current facility-administered medications on file prior to visit.        ROS:  All others reviewed and negative.  Objective        PE:  BP 124/76 (BP Location: Left Arm, Patient Position: Sitting, Cuff Size: Large)   Pulse 87   Temp 98.3 F (36.8 C) (Oral)   Ht _0  (1.575 m)   Wt 261 lb (118.4 kg)   LMP 06/21/2010   SpO2 99%   BMI 47.74 kg/m                 Constitutional: Pt appears in NAD               HENT: Head: NCAT.                Right Ear: External ear normal.                 Left Ear: External ear normal. Bilat tm's with mild erythema.  Max sinus areas mild tender.  Pharynx with mild erythema, no exudate               Eyes: . Pupils are equal, round, and reactive to light. Conjunctivae and EOM are normal               Nose: without d/c or deformity               Neck: Neck supple. Gross normal ROM               Cardiovascular: Normal rate and regular rhythm.                 Pulmonary/Chest: Effort normal and breath sounds without rales or wheezing.  Neurological: Pt is alert. At baseline orientation, motor grossly intact               Skin: Skin is warm. No rashes, no other new lesions, LE edema - none               Psychiatric: Pt behavior is normal without agitation   Micro: none  Cardiac tracings I have personally interpreted today:  none  Pertinent Radiological findings (summarize): none   Lab Results  Component Value Date   WBC 3.9 (L) 01/21/2022   HGB 12.4 01/21/2022   HCT 37.6 01/21/2022   PLT 233 01/21/2022   GLUCOSE 150 (H) 01/21/2022   CHOL 211 (H) 11/06/2021   TRIG 162.0 (H) 11/06/2021   HDL 47.30 11/06/2021   LDLDIRECT 143.0 10/03/2018   LDLCALC 131 (H) 11/06/2021   ALT 29 01/21/2022   AST 20 01/21/2022   NA 136 01/21/2022   K 4.0 01/21/2022   CL 102 01/21/2022   CREATININE 0.73 01/21/2022   BUN 13 01/21/2022   CO2  28 01/21/2022   TSH 4.68 11/06/2021   HGBA1C 11.4 (H) 11/06/2021   MICROALBUR 0.8 11/06/2021   SARS Coronavirus 2 Ag Negative Positive Abnormal     Assessment/Plan:  Sejal Cofield is a 51 y.o. Black or African American [2] female with  has a past medical history of ANEMIA-IRON DEFICIENCY (01/30/2010), ANXIETY (11/27/2007), Arthritis, Asthma (02/25/2011), Cancer ([place]), Carbuncle and furuncle of trunk (04/16/2010), Chlamydia infection (03/21/2008), DIABETES MELLITUS, TYPE II (08/02/2007), Edema (01/30/2010), ELEVATED BLOOD PRESSURE WITHOUT DIAGNOSIS OF HYPERTENSION (08/02/2007), Family history of breast cancer, Family history of colon cancer, Family history of lung cancer, FREQUENCY, URINARY (05/19/2009), GENITAL HERPES (03/12/2009), GERD (gastroesophageal reflux disease), History of kidney stones, History of radiation therapy (07/27/18- 09/06/18), HIV (human immunodeficiency virus infection) ([place]) (2009), HIV INFECTION (03/12/2009), HSV (herpes simplex virus) infection, HYPERLIPIDEMIA (08/02/2007), Hypertension, Metrorrhagia (03/21/2008), PONV (postoperative nausea and vomiting), RETENTION, URINE (05/19/2009), Sleep apnea, Trichomonas infection (01/19/2010), and VITAMIN D DEFICIENCY (01/30/2010).  COVID-19 virus infection Recent onset symptoms in high risk patient, with URI symptoms and cough, now for paxlovid course, cough med prn, albuterol hfa inhaler prn refill, and  to f/u any worsening symptoms or concerns   Hypertension BP Readings from Last 3 Encounters:  04/30/22 124/76  01/21/22 133/69  11/06/21 118/68   Stable, pt to continue medical treatment coreg 6.25 mg bid, hct 12.5 mg qd, losartan 25 mg qd   Vitamin D deficiency Last vitamin D Lab Results  Component Value Date   VD25OH 20.05 (L) 11/06/2021   Low, reminded to start oral replacement  Followup: Return if symptoms worsen or fail to improve.  Cathlean Cower, MD 04/30/2022 8:46 PM [facility]

## 2022-05-27 ENCOUNTER — Other Ambulatory Visit: Payer: Self-pay | Admitting: Hematology and Oncology

## 2022-05-27 ENCOUNTER — Other Ambulatory Visit: Payer: Self-pay | Admitting: Internal Medicine

## 2022-05-27 DIAGNOSIS — C50911 Malignant neoplasm of unspecified site of right female breast: Secondary | ICD-10-CM

## 2022-06-08 ENCOUNTER — Telehealth: Payer: Self-pay | Admitting: Internal Medicine

## 2022-06-08 NOTE — Telephone Encounter (Signed)
N/A unable to leave a message for patient to call back to schedule Medicare Annual Wellness Visit   No hx of AWV eligible as of 05/26/22  Please schedule at anytime with [facility] Advisor if patient calls the office back.     Any questions, please call me at [phone]

## 2022-06-10 ENCOUNTER — Telehealth: Payer: Self-pay

## 2022-06-10 NOTE — Telephone Encounter (Signed)
Notified Patient of completion of Disability paperwork. Fax transmission confirmation received. Copy of paperwork mailed to Patient as requested. No other needs or concerns voiced at this time.

## 2022-06-16 ENCOUNTER — Other Ambulatory Visit (HOSPITAL_COMMUNITY): Payer: Self-pay

## 2022-06-16 ENCOUNTER — Other Ambulatory Visit: Payer: Self-pay | Admitting: Internal Medicine

## 2022-06-16 MED ORDER — BELSOMRA 15 MG PO TABS
15.0000 mg | ORAL_TABLET | Freq: Every evening | ORAL | 5 refills | Status: AC | PRN
  Filled 2022-06-16: qty 30, 30d supply, fill #0

## 2022-06-18 ENCOUNTER — Other Ambulatory Visit (HOSPITAL_COMMUNITY): Payer: Self-pay

## 2022-07-01 ENCOUNTER — Other Ambulatory Visit (HOSPITAL_COMMUNITY): Payer: Self-pay

## 2022-07-02 ENCOUNTER — Ambulatory Visit
Admission: RE | Admit: 2022-07-02 | Discharge: 2022-07-02 | Disposition: A | Discharge status: Home / Self Care | Payer: Medicare PPO | Source: Ambulatory Visit | Attending: Hematology and Oncology | Admitting: Hematology and Oncology

## 2022-07-02 DIAGNOSIS — Z170 Estrogen receptor positive status [ER+]: Secondary | ICD-10-CM

## 2022-07-02 DIAGNOSIS — Z1231 Encounter for screening mammogram for malignant neoplasm of breast: Secondary | ICD-10-CM | POA: Diagnosis not present

## 2022-07-06 ENCOUNTER — Other Ambulatory Visit: Payer: Self-pay | Admitting: Internal Medicine

## 2022-08-02 DIAGNOSIS — Z79810 Long term (current) use of selective estrogen receptor modulators (SERMs): Secondary | ICD-10-CM | POA: Diagnosis not present

## 2022-08-02 DIAGNOSIS — B20 Human immunodeficiency virus [HIV] disease: Secondary | ICD-10-CM | POA: Diagnosis not present

## 2022-08-02 DIAGNOSIS — C50919 Malignant neoplasm of unspecified site of unspecified female breast: Secondary | ICD-10-CM | POA: Diagnosis not present

## 2022-08-02 DIAGNOSIS — Z23 Encounter for immunization: Secondary | ICD-10-CM | POA: Diagnosis not present

## 2022-08-02 DIAGNOSIS — E119 Type 2 diabetes mellitus without complications: Secondary | ICD-10-CM | POA: Diagnosis not present

## 2022-08-17 ENCOUNTER — Other Ambulatory Visit (HOSPITAL_COMMUNITY)
Admission: RE | Admit: 2022-08-17 | Discharge: 2022-08-17 | Disposition: A | Discharge status: Home / Self Care | Payer: Medicare PPO | Source: Ambulatory Visit | Attending: Nurse Practitioner | Admitting: Nurse Practitioner

## 2022-08-17 ENCOUNTER — Encounter: Payer: Self-pay | Admitting: Nurse Practitioner

## 2022-08-17 ENCOUNTER — Ambulatory Visit: Payer: Medicare PPO | Admitting: Nurse Practitioner

## 2022-08-17 VITALS — BP 120/84 | Ht 60.5 in | Wt 251.0 lb

## 2022-08-17 DIAGNOSIS — Z853 Personal history of malignant neoplasm of breast: Secondary | ICD-10-CM | POA: Diagnosis not present

## 2022-08-17 DIAGNOSIS — Z01419 Encounter for gynecological examination (general) (routine) without abnormal findings: Secondary | ICD-10-CM | POA: Diagnosis not present

## 2022-08-17 DIAGNOSIS — B20 Human immunodeficiency virus [HIV] disease: Secondary | ICD-10-CM | POA: Diagnosis not present

## 2022-08-17 DIAGNOSIS — Z9189 Other specified personal risk factors, not elsewhere classified: Secondary | ICD-10-CM

## 2022-08-17 DIAGNOSIS — B372 Candidiasis of skin and nail: Secondary | ICD-10-CM | POA: Diagnosis not present

## 2022-08-17 MED ORDER — NYSTATIN-TRIAMCINOLONE 100000-0.1 UNIT/GM-% EX OINT: 1.0000 | TOPICAL_OINTMENT | Freq: Two times a day (BID) | CUTANEOUS | 0 refills | Status: DC

## 2022-08-17 NOTE — Progress Notes (Signed)
   Kelly Rowe Feb 22, 1971 [id number]   History:  52 y.o. [id number] presents for breast and pelvic exam. Complains of malodorous discharge from navel x 2 weeks. Navel is very deep ~3 inches so it is hard for her to clean. She does take a Q-tip to it. S/P 2011 TAH for fibroids. Normal pap history. 2019 right triple positive breast cancer managed with lumpectomy, chemo, radiation, and antiestrogen therapy (Tamoxifen 05/2019). History of HIV (managed [facility], undetectable viral load), HTN, HLD, T2DM managed by PCP.   Gynecologic History Patient's last menstrual period was 06/21/2010.   Contraception/Family planning: post menopausal status Sexually active: No  Health Maintenance Last Pap: 06/20/2019. Results were: Normal Last mammogram: 07/02/2022. Results were: Normal Last colonoscopy: 06/14/2019. Results were: Benign polyp,5-year recall Last Dexa: Not indicated  Past medical history, past surgical history, family history and social history were all reviewed and documented in the EPIC chart. Divorced. 5 children ages 72-37. 4 grandchildren.   ROS:  A ROS was performed and pertinent positives and negatives are included.  Exam:  Vitals:   08/17/22 1418  BP: 120/84  Weight: 251 lb (113.9 kg)  Height: 5' 0.5" (1.537 m)    Body mass index is 48.21 kg/m.  General appearance:  Normal Thyroid:  Symmetrical, normal in size, without palpable masses or nodularity. Respiratory  Auscultation:  Clear without wheezing or rhonchi Cardiovascular  Auscultation:  Regular rate, without rubs, murmurs or gallops  Edema/varicosities:  Not grossly evident Abdominal  Soft,nontender, without masses, guarding or rebound.  Liver/spleen:  No organomegaly noted  Hernia:  None appreciated  Skin  Inspection:  Grossly normal. Some redness and odor at navel, no discharge Breasts: Examined lying and sitting.   Right: Without masses, retractions, nipple discharge or axillary adenopathy. Lymphedema  present.  Left: Without masses, retractions, nipple discharge or axillary adenopathy. Genitourinary   Inguinal/mons:  Normal without inguinal adenopathy  External genitalia:  Normal appearing vulva with no masses, tenderness, or lesions  BUS/Urethra/Skene's glands:  Normal  Vagina:  Normal appearing with normal color and discharge, no lesions  Cervix:  Absent  Uterus:  Absent  Adnexa/parametria:     Rt: Normal in size, without masses or tenderness.   Lt: Normal in size, without masses or tenderness.  Anus and perineum: Normal  Digital rectal exam: Deferred  Patient informed chaperone available to be present for breast and pelvic exam. Patient has requested no chaperone to be present. Patient has been advised what will be completed during breast and pelvic exam.   Assessment/Plan:  52 y.o. [id number] for breast and pelvic exam.   Well female exam with routine gynecological exam - Plan: Cytology - PAP( [place]). Education provided on SBEs, importance of preventative screenings, current guidelines, high calcium diet, regular exercise, and multivitamin daily.  Labs with PCP.   History of right breast cancer - 2019 right triple positive breast cancer managed with lumpectomy, chemo, radiation, and antiestrogen therapy (Tamoxifen 05/2019). Being followed by oncology. Continue annual screenings.   Postmenopausal - no HRT  Skin yeast infection - Plan: nystatin-triamcinolone ointment (MYCOLOG) BID x 7 days to navel. Apply to Q-tip for application.   Screening for cervical cancer - Normal Pap history. Pap every 3 years d/t HIV status. Vaginal pap today.   Screening for colon cancer - 2020 colonoscopy. Will repeat at GI's recommended interval.   Return in 1 year for breast and pelvic exam.     Tamela Gammon DNP, 2:39 PM 08/17/2022

## 2022-08-21 LAB — CYTOLOGY - PAP: Diagnosis: NEGATIVE

## 2022-08-23 ENCOUNTER — Other Ambulatory Visit: Payer: Self-pay | Admitting: Hematology and Oncology

## 2022-08-23 DIAGNOSIS — C50911 Malignant neoplasm of unspecified site of right female breast: Secondary | ICD-10-CM

## 2022-08-24 ENCOUNTER — Other Ambulatory Visit: Payer: Self-pay | Admitting: *Deleted

## 2022-08-24 ENCOUNTER — Other Ambulatory Visit (HOSPITAL_COMMUNITY): Payer: Self-pay

## 2022-08-24 MED ORDER — METRONIDAZOLE 500 MG PO TABS
500.0000 mg | ORAL_TABLET | Freq: Two times a day (BID) | ORAL | 0 refills | Status: DC
  Filled 2022-08-24: qty 14, 7d supply, fill #0

## 2022-08-26 ENCOUNTER — Other Ambulatory Visit (HOSPITAL_COMMUNITY): Payer: Self-pay

## 2022-08-26 ENCOUNTER — Other Ambulatory Visit: Payer: Self-pay | Admitting: Nurse Practitioner

## 2022-08-26 ENCOUNTER — Telehealth: Payer: Self-pay

## 2022-08-26 DIAGNOSIS — B372 Candidiasis of skin and nail: Secondary | ICD-10-CM

## 2022-08-26 DIAGNOSIS — L292 Pruritus vulvae: Secondary | ICD-10-CM

## 2022-08-26 DIAGNOSIS — B9689 Other specified bacterial agents as the cause of diseases classified elsewhere: Secondary | ICD-10-CM

## 2022-08-26 MED ORDER — NYSTATIN-TRIAMCINOLONE 100000-0.1 UNIT/GM-% EX OINT: 1.0000 | TOPICAL_OINTMENT | Freq: Two times a day (BID) | CUTANEOUS | 0 refills | Status: AC

## 2022-08-26 MED ORDER — FLUCONAZOLE 150 MG PO TABS: 150.0000 mg | ORAL_TABLET | ORAL | 0 refills | Status: DC

## 2022-08-26 MED ORDER — METRONIDAZOLE 500 MG PO TABS: 500.0000 mg | ORAL_TABLET | Freq: Two times a day (BID) | ORAL | 0 refills | Status: DC

## 2022-08-26 NOTE — Telephone Encounter (Signed)
Patient called because Rx for BV never arrived at her pharmacy.  Upon review it was sent to [facility]. I called and cancelled Rx there as she wants to pick it up at [facility].  Patient also mentioned that since visit she is experiencing external itching and irritation and reports she has drank wine and ate fruit and she knows that is why she has yeast infection. She requests Rx for yeast as well.

## 2022-08-26 NOTE — Telephone Encounter (Signed)
Sorry, the Rx for BV derived from the Pap smear result. She requested oral Metronidazole.  The Kenalog is ready for her to pick up.

## 2022-08-26 NOTE — Telephone Encounter (Signed)
Ah ok. Just sent to updated pharmacy. Thanks.

## 2022-08-26 NOTE — Telephone Encounter (Signed)
I just want to confirm that it was the kenalog ointment for yeast infection in her belly button? No BV treatment provided. I also sent Diflucan.

## 2022-09-10 ENCOUNTER — Ambulatory Visit: Payer: Medicare PPO

## 2022-11-01 LAB — HM DIABETES EYE EXAM

## 2022-11-12 ENCOUNTER — Encounter: Payer: Self-pay | Admitting: Internal Medicine

## 2022-12-29 ENCOUNTER — Other Ambulatory Visit: Payer: Self-pay

## 2022-12-29 ENCOUNTER — Other Ambulatory Visit: Payer: Self-pay | Admitting: Internal Medicine

## 2023-01-20 ENCOUNTER — Encounter: Payer: Self-pay | Admitting: Internal Medicine

## 2023-01-20 ENCOUNTER — Ambulatory Visit: Payer: Medicare PPO | Admitting: Internal Medicine

## 2023-01-20 VITALS — BP 128/76 | HR 76 | Temp 98.3°F | Ht 60.5 in | Wt 246.0 lb

## 2023-01-20 DIAGNOSIS — Z0001 Encounter for general adult medical examination with abnormal findings: Secondary | ICD-10-CM

## 2023-01-20 DIAGNOSIS — E559 Vitamin D deficiency, unspecified: Secondary | ICD-10-CM | POA: Diagnosis not present

## 2023-01-20 DIAGNOSIS — F5101 Primary insomnia: Secondary | ICD-10-CM

## 2023-01-20 DIAGNOSIS — E08319 Diabetes mellitus due to underlying condition with unspecified diabetic retinopathy without macular edema: Secondary | ICD-10-CM

## 2023-01-20 DIAGNOSIS — I10 Essential (primary) hypertension: Secondary | ICD-10-CM

## 2023-01-20 DIAGNOSIS — E7800 Pure hypercholesterolemia, unspecified: Secondary | ICD-10-CM

## 2023-01-20 DIAGNOSIS — E538 Deficiency of other specified B group vitamins: Secondary | ICD-10-CM | POA: Diagnosis not present

## 2023-01-20 DIAGNOSIS — K921 Melena: Secondary | ICD-10-CM

## 2023-01-20 LAB — URINALYSIS, ROUTINE W REFLEX MICROSCOPIC
Hgb urine dipstick: NEGATIVE
Leukocytes,Ua: NEGATIVE
Nitrite: NEGATIVE
RBC / HPF: NONE SEEN (ref 0–?)
Specific Gravity, Urine: >=1.03 — AB (ref 1.000–1.030)
Total Protein, Urine: NEGATIVE
Urine Glucose: NEGATIVE
Urobilinogen, UA: 0.2 (ref 0.0–1.0)
pH: 5.5 (ref 5.0–8.0)

## 2023-01-20 LAB — LIPID PANEL
Cholesterol: 214 mg/dL — ABNORMAL HIGH (ref 0–200)
HDL: 47.2 mg/dL (ref >39.00)
LDL Cholesterol: 137 mg/dL — ABNORMAL HIGH (ref 0–99)
NonHDL: 167.11
Total CHOL/HDL Ratio: 5
Triglycerides: 149 mg/dL (ref 0.0–149.0)
VLDL: 29.8 mg/dL (ref 0.0–40.0)

## 2023-01-20 LAB — HEMOGLOBIN A1C: Hgb A1c MFr Bld: 9.7 % — ABNORMAL HIGH (ref 4.6–6.5)

## 2023-01-20 LAB — BASIC METABOLIC PANEL
BUN: 10 mg/dL (ref 6–23)
CO2: 28 mEq/L (ref 19–32)
Calcium: 9.5 mg/dL (ref 8.4–10.5)
Chloride: 99 mEq/L (ref 96–112)
Creatinine, Ser: 0.76 mg/dL (ref 0.40–1.20)
GFR: 89.05 mL/min (ref >60.00)
Glucose, Bld: 160 mg/dL — ABNORMAL HIGH (ref 70–99)
Potassium: 4.2 mEq/L (ref 3.5–5.1)
Sodium: 135 mEq/L (ref 135–145)

## 2023-01-20 LAB — CBC WITH DIFFERENTIAL/PLATELET
Basophils Absolute: 0 10*3/uL (ref 0.0–0.1)
Basophils Relative: 0.6 % (ref 0.0–3.0)
Eosinophils Absolute: 0 10*3/uL (ref 0.0–0.7)
Eosinophils Relative: 0.5 % (ref 0.0–5.0)
HCT: 42.6 % (ref 36.0–46.0)
Hemoglobin: 13.6 g/dL (ref 12.0–15.0)
Lymphocytes Relative: 55.9 % — ABNORMAL HIGH (ref 12.0–46.0)
Lymphs Abs: 1.8 10*3/uL (ref 0.7–4.0)
MCHC: 32 g/dL (ref 30.0–36.0)
MCV: 87.4 fl (ref 78.0–100.0)
Monocytes Absolute: 0.3 10*3/uL (ref 0.1–1.0)
Monocytes Relative: 8.3 % (ref 3.0–12.0)
Neutro Abs: 1.1 10*3/uL — ABNORMAL LOW (ref 1.4–7.7)
Neutrophils Relative %: 34.7 % — ABNORMAL LOW (ref 43.0–77.0)
Platelets: 237 10*3/uL (ref 150.0–400.0)
RBC: 4.88 Mil/uL (ref 3.87–5.11)
RDW: 13.6 % (ref 11.5–15.5)
WBC: 3.2 10*3/uL — ABNORMAL LOW (ref 4.0–10.5)

## 2023-01-20 LAB — HEPATIC FUNCTION PANEL
ALT: 21 U/L (ref 0–35)
AST: 24 U/L (ref 0–37)
Albumin: 4.3 g/dL (ref 3.5–5.2)
Alkaline Phosphatase: 110 U/L (ref 39–117)
Bilirubin, Direct: 0.1 mg/dL (ref 0.0–0.3)
Total Bilirubin: 0.5 mg/dL (ref 0.2–1.2)
Total Protein: 7.6 g/dL (ref 6.0–8.3)

## 2023-01-20 LAB — VITAMIN B12: Vitamin B-12: 881 pg/mL (ref 211–911)

## 2023-01-20 LAB — MICROALBUMIN / CREATININE URINE RATIO
Creatinine,U: 246.1 mg/dL
Microalb Creat Ratio: 0.3 mg/g (ref 0.0–30.0)
Microalb, Ur: 0.7 mg/dL (ref 0.0–1.9)

## 2023-01-20 LAB — TSH: TSH: 2.82 u[IU]/mL (ref 0.35–5.50)

## 2023-01-20 LAB — VITAMIN D 25 HYDROXY (VIT D DEFICIENCY, FRACTURES): VITD: 23.56 ng/mL — ABNORMAL LOW (ref 30.00–100.00)

## 2023-01-20 MED ORDER — ZOLPIDEM TARTRATE 10 MG PO TABS: ORAL_TABLET | ORAL | 1 refills | Status: DC

## 2023-01-20 NOTE — Patient Instructions (Addendum)
Please take all new medication as prescribed  - the zolpidem  Please continue all other medications as before, and refills have been done if requested.  Please have the pharmacy call with any other refills you may need.  Please continue your efforts at being more active, low cholesterol diet, and weight control.  You are otherwise up to date with prevention measures today.  Please keep your appointments with your specialists as you may have planned  Please go to the LAB at the blood drawing area for the tests to be done  You will be contacted by phone if any changes need to be made immediately.  Otherwise, you will receive a letter about your results with an explanation, but please check with MyChart first.  Please remember to sign up for MyChart if you have not done so, as this will be important to you in the future with finding out test results, communicating by private email, and scheduling acute appointments online when needed.  Please make an Appointment to return in 6 months, or sooner if needed

## 2023-01-20 NOTE — Progress Notes (Signed)
Chief Complaint:: wellness exam and insonnia, dm, low vit d, hld, htn       HPI:  Kelly Rowe is a 52 y.o. female here for wellness exam; up to date                        Also Pt denies chest pain, increased sob or doe, wheezing, orthopnea, PND, increased LE swelling, palpitations, dizziness or syncope.   Pt denies polydipsia, polyuria, or new focal neuro s/s.    Pt denies fever, wt loss, night sweats, loss of appetite, or other constitutional symptoms  Has worsening insomnia asking for slight increased ambien   Wt Readings from Last 3 Encounters:  01/20/23 246 lb (111.6 kg)  08/17/22 251 lb (113.9 kg)  04/30/22 261 lb (118.4 kg)   BP Readings from Last 3 Encounters:  01/20/23 128/76  08/17/22 120/84  04/30/22 124/76   Immunization History  Administered Date(s) Administered   Covid-19, Mrna,Vaccine(Spikevax)18yrs and older 08/02/2022   Hep A / Hep B 09/05/2008, 10/31/2008, 05/01/2009   Influenza Split 05/30/2012, 08/08/2013, 05/10/2014, 03/31/2016   Influenza, Quadrivalent, Recombinant, Inj, Pf 03/31/2018   Influenza,inj,Quad PF,6+ Mos 05/30/2012, 03/31/2016, 04/05/2019, 05/04/2021, 08/02/2022   Influenza,inj,quad, With Preservative 08/08/2013, 05/10/2014   Influenza,trivalent, recombinat, inj, PF 03/31/2018   Influenza-Unspecified 05/30/2012, 03/31/2016   Meningococcal Mcv4o 01/18/2022   PPD Test 01/10/2015   Pneumococcal Conjugate-13 08/08/2013   Pneumococcal Polysaccharide-23 05/23/2008, 05/10/2014   Td 03/12/2009, 12/06/2022   Td (Adult),5 Lf Tetanus Toxid, Preservative Free 03/12/2009   Tdap 11/02/2010, 04/05/2019   Zoster Recombinant(Shingrix) 08/30/2022, 11/01/2022   Health Maintenance Due  Topic Date Due   Medicare Annual Wellness (AWV)  Never done      Past Medical History:  Diagnosis Date   ANEMIA-IRON DEFICIENCY 01/30/2010   ANXIETY 11/27/2007   Arthritis    Asthma 02/25/2011   Cancer (HCC)    breast   Carbuncle and furuncle of trunk 04/16/2010    Chlamydia infection 03/21/2008   DIABETES MELLITUS, TYPE II 08/02/2007   Edema 01/30/2010   ELEVATED BLOOD PRESSURE WITHOUT DIAGNOSIS OF HYPERTENSION 08/02/2007   Family history of breast cancer    Family history of colon cancer    Family history of lung cancer    FREQUENCY, URINARY 05/19/2009   GENITAL HERPES 03/12/2009   GERD (gastroesophageal reflux disease)    History of kidney stones    History of radiation therapy 07/27/18- 09/06/18   Right Breast, Right Axillary and Supraclavicular nodes 50 Gy in 25 fractions, Right Breast Boost 10 Gy in 5 fractions   HIV (human immunodeficiency virus infection) (HCC) 2009   HIV INFECTION 03/12/2009   HSV (herpes simplex virus) infection    HYPERLIPIDEMIA 08/02/2007   Hypertension    Metrorrhagia 03/21/2008   PONV (postoperative nausea and vomiting)    woke up crying per patient    RETENTION, URINE 05/19/2009   Sleep apnea    Trichomonas infection 01/19/2010   VITAMIN D DEFICIENCY 01/30/2010   Past Surgical History:  Procedure Laterality Date   ABDOMINAL HYSTERECTOMY  07/22/2010   TAH WITH PRESERVATION OF BOTH TUBES AND OVARIES   BREAST LUMPECTOMY Right 2019   BREAST LUMPECTOMY WITH RADIOACTIVE SEED AND SENTINEL LYMPH NODE BIOPSY Right 02/23/2018   Procedure: BREAST LUMPECTOMY WITH RADIOACTIVE SEED AND SENTINEL LYMPH NODE BIOPSY;  Surgeon: Almond Lint, MD;  Location: [facility];  Service: General;  Laterality: Right;   CHOLECYSTECTOMY     ENDOMETRIAL ABLATION  01/11/2008   HER OPTION   ESOPHAGOGASTRODUODENOSCOPY     MULTIPLE TOOTH EXTRACTIONS     PORT-A-CATH REMOVAL Left 05/08/2019   Procedure: REMOVAL PORT-A-CATH;  Surgeon: Almond Lint, MD;  Location: [facility];  Service: General;  Laterality: Left;   PORTACATH PLACEMENT Left 02/23/2018   Procedure: INSERTION PORT-A-CATH;  Surgeon: Almond Lint, MD;  Location: [facility];  Service: General;  Laterality: Left;   PORTACATH PLACEMENT N/A 04/21/2018   Procedure: PORT-A-CATH REVISION;  Surgeon:  Almond Lint, MD;  Location: [facility];  Service: General;  Laterality: N/A;   TUBAL LIGATION     WISDOM TOOTH EXTRACTION      reports that she has never smoked. She has been exposed to tobacco smoke. She has never used smokeless tobacco. She reports current alcohol use. She reports that she does not use drugs. family history includes Breast cancer in her paternal aunt, paternal aunt, and paternal aunt; Cancer in her father; Clotting disorder in her mother; Colon cancer in her father, paternal grandfather, and another family member; Diabetes in her father and mother; Heart attack (age of onset: 42) in her maternal grandfather; Heart disease in an other family member; Hypertension in her mother; Lung cancer in her paternal aunt; Lung cancer (age of onset: 53) in her maternal grandmother; Prostate cancer in an other family member; Stroke in her paternal uncle and another family member. Allergies  Allergen Reactions   Metformin And Related Other (See Comments)    Bowel frequency   Levaquin [Levofloxacin In D5w] Nausea And Vomiting and Rash   Penicillins Swelling and Rash    Has patient had a PCN reaction causing immediate rash, facial/tongue/throat swelling, SOB or lightheadedness with hypotension: Yes Has patient had a PCN reaction causing severe rash involving mucus membranes or skin necrosis: No Has patient had a PCN reaction that required hospitalization: Yes Has patient had a PCN reaction occurring within the last 10 years: No If all of the above answers are "NO", then may proceed with Cephalosporin use.   Current Outpatient Medications on File Prior to Visit  Medication Sig Dispense Refill   albuterol (VENTOLIN HFA) 108 (90 Base) MCG/ACT inhaler Inhale 2 puffs into the lungs every 6 (six) hours as needed for wheezing or shortness of breath. 1 g 11   ALPRAZolam (XANAX) 0.25 MG tablet Take by mouth.     Beclomethasone Dipropionate 80 MCG/ACT AERS Place into the nose.      bictegravir-emtricitabine-tenofovir AF (BIKTARVY) 50-200-25 MG TABS tablet Take 1 tablet by mouth daily.     blood glucose meter kit and supplies Dispense based on patient and insurance preference. Use up to four times daily as directed. (FOR ICD-10 E10.9, E11.9). 1 each 0   carvedilol (COREG) 6.25 MG tablet TAKE 1 TABLET(6.25 MG) BY MOUTH TWICE DAILY 60 tablet 11   Cholecalciferol (THERA-D 2000) 50 MCG (2000 UT) TABS 1 tab by mouth once daily 30 tablet 99   Diclofenac Sodium (PENNSAID) 2 % SOLN Place 1 application onto the skin 2 (two) times daily. 112 g 2   Doxepin HCl 3 MG TABS Take 1 tablet (3 mg total) by mouth at bedtime. 30 tablet 3   Ferrous Fumarate (HEMOCYTE - 106 MG FE) 324 (106 Fe) MG TABS tablet Take by mouth.     ferrous gluconate (FERGON) 240 (27 FE) MG tablet Take 240 mg by mouth daily as needed (iron).      fluconazole (DIFLUCAN) 150 MG tablet Take 1 tablet (150 mg total) by mouth  every 3 (three) days. 2 tablet 0   FLUoxetine (PROZAC) 20 MG capsule Take 1 capsule by mouth once daily 90 capsule 2   gabapentin (NEURONTIN) 300 MG capsule Take 1 capsule (300 mg total) by mouth at bedtime. 90 capsule 4   glipiZIDE (GLUCOTROL XL) 10 MG 24 hr tablet TAKE 1 TABLET(10 MG) BY MOUTH DAILY WITH BREAKFAST 90 tablet 3   glucose blood (ONETOUCH VERIO) test strip USE 1 STRIP TO CHECK GLUCOSE 4 TIMES DAILY AS DIRECTED. DX: E11.69 100 each 11   hydrochlorothiazide (MICROZIDE) 12.5 MG capsule Take 1 capsule by mouth daily.     hydrocortisone (ANUSOL-HC) 2.5 % rectal cream Place 1 application rectally 2 (two) times daily. 30 g 1   ibuprofen (ADVIL,MOTRIN) 800 MG tablet Take 1 tablet (800 mg total) by mouth every 8 (eight) hours as needed. 60 tablet 0   Insulin Pen Needle (PEN NEEDLES) 32G X 4 MM MISC Use with insulin pen 100 each 12   Lancets (ONETOUCH DELICA PLUS LANCET33G) MISC USE TO TEST BLOOD SUGAR UP TO 4 TIMES DAILY 100 each 5   loratadine (CLARITIN) 10 MG tablet Take 10 mg by mouth daily.      losartan (COZAAR) 25 MG tablet Take 1 tablet (25 mg total) by mouth daily. 30 tablet 11   metroNIDAZOLE (FLAGYL) 500 MG tablet Take 1 tablet (500 mg) by mouth 2 times daily for 7 days 14 tablet 0   MOUNJARO 5 MG/0.5ML Pen INJECT 5 MG INTO THE SKIN ONCE A WEEK 12 mL 0   Multiple Vitamin (MULTIVITAMIN WITH MINERALS) TABS tablet Take 1 tablet by mouth daily.     nitroGLYCERIN (NITRODUR - DOSED IN MG/24 HR) 0.2 mg/hr patch Apply 1/4 patch daily to tendon for tendonitis. 30 patch 1   nystatin-triamcinolone ointment (MYCOLOG) Apply 1 Application topically 2 (two) times daily. 30 g 0   pioglitazone (ACTOS) 45 MG tablet Take 1 tablet (45 mg total) by mouth daily. 90 tablet 3   prochlorperazine (COMPAZINE) 10 MG tablet Take by mouth.     rosuvastatin (CRESTOR) 40 MG tablet Take 1 tablet (40 mg total) by mouth daily. 90 tablet 3   scopolamine (TRANSDERM-SCOP) 1 MG/3DAYS Place 1 patch (1.5 mg total) onto the skin every 3 (three) days. 10 patch 12   Suvorexant (BELSOMRA) 15 MG TABS Take 1 tablet by mouth at bedtime as needed. 30 tablet 5   tamoxifen (NOLVADEX) 20 MG tablet Take 1 tablet by mouth once daily 90 tablet 4   tirzepatide (MOUNJARO) 5 MG/0.5ML Pen Inject into the skin.     triamcinolone (NASACORT) 55 MCG/ACT AERO nasal inhaler Place 2 sprays into the nose daily. 1 each 12   valACYclovir (VALTREX) 500 MG tablet Take twice daily for 3-5 days 30 tablet 2   zolpidem (AMBIEN) 10 MG tablet Take by mouth.     No current facility-administered medications on file prior to visit.        ROS:  All others reviewed and negative.  Objective        PE:  BP 128/76 (BP Location: Right Arm, Patient Position: Sitting, Cuff Size: Normal)   Pulse 76   Temp 98.3 F (36.8 C) (Oral)   Ht 5' 0.5" (1.537 m)   Wt 246 lb (111.6 kg)   LMP 06/21/2010   SpO2 95%   BMI 47.25 kg/m                 Constitutional: Pt appears in NAD  HENT: Head: NCAT.                Right Ear: External ear normal.                  Left Ear: External ear normal.                Eyes: . Pupils are equal, round, and reactive to light. Conjunctivae and EOM are normal               Nose: without d/c or deformity               Neck: Neck supple. Gross normal ROM               Cardiovascular: Normal rate and regular rhythm.                 Pulmonary/Chest: Effort normal and breath sounds without rales or wheezing.                Abd:  Soft, NT, ND, + BS, no organomegaly               Neurological: Pt is alert. At baseline orientation, motor grossly intact               Skin: Skin is warm. No rashes, no other new lesions, LE edema - none               Psychiatric: Pt behavior is normal without agitation   Micro: none  Cardiac tracings I have personally interpreted today:  none  Pertinent Radiological findings (summarize): none   Lab Results  Component Value Date   WBC 3.2 (L) 01/20/2023   HGB 13.6 01/20/2023   HCT 42.6 01/20/2023   PLT 237.0 01/20/2023   GLUCOSE 160 (H) 01/20/2023   CHOL 214 (H) 01/20/2023   TRIG 149.0 01/20/2023   HDL 47.20 01/20/2023   LDLDIRECT 143.0 10/03/2018   LDLCALC 137 (H) 01/20/2023   ALT 21 01/20/2023   AST 24 01/20/2023   NA 135 01/20/2023   K 4.2 01/20/2023   CL 99 01/20/2023   CREATININE 0.76 01/20/2023   BUN 10 01/20/2023   CO2 28 01/20/2023   TSH 2.82 01/20/2023   HGBA1C 9.7 (H) 01/20/2023   MICROALBUR 0.7 01/20/2023   Assessment/Plan:  Kelly Rowe is a 52 y.o. Black or African American [2] female with  has a past medical history of ANEMIA-IRON DEFICIENCY (01/30/2010), ANXIETY (11/27/2007), Arthritis, Asthma (02/25/2011), Cancer (HCC), Carbuncle and furuncle of trunk (04/16/2010), Chlamydia infection (03/21/2008), DIABETES MELLITUS, TYPE II (08/02/2007), Edema (01/30/2010), ELEVATED BLOOD PRESSURE WITHOUT DIAGNOSIS OF HYPERTENSION (08/02/2007), Family history of breast cancer, Family history of colon cancer, Family history of lung cancer, FREQUENCY, URINARY (05/19/2009),  GENITAL HERPES (03/12/2009), GERD (gastroesophageal reflux disease), History of kidney stones, History of radiation therapy (07/27/18- 09/06/18), HIV (human immunodeficiency virus infection) (HCC) (2009), HIV INFECTION (03/12/2009), HSV (herpes simplex virus) infection, HYPERLIPIDEMIA (08/02/2007), Hypertension, Metrorrhagia (03/21/2008), PONV (postoperative nausea and vomiting), RETENTION, URINE (05/19/2009), Sleep apnea, Trichomonas infection (01/19/2010), and VITAMIN D DEFICIENCY (01/30/2010).  Encounter for well adult exam with abnormal findings Age and sex appropriate education and counseling updated with regular exercise and diet Referrals for preventative services - none needed Immunizations addressed - none needed Smoking counseling  - none needed Evidence for depression or other mood disorder - none significant Most recent labs reviewed. 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, without long-term current use of insulin (HCC) Lab Results  Component Value Date   HGBA1C 9.7 (H) 01/20/2023   Chronic uncontrolled, pt to continue current medical treatment glucotrol xl 10 every day, actos 45 every day, and mounjaro 5 q wk, f/u endo as planned, declines other change for now   Vitamin D deficiency Last vitamin D Lab Results  Component Value Date   VD25OH 23.56 (L) 01/20/2023   Low to start oral replacement   Hyperlipidemia Lab Results  Component Value Date   LDLCALC 137 (H) 01/20/2023   Uncontrolled, encouraged compliance, to restart crestor 40 qd   Hypertension BP Readings from Last 3 Encounters:  01/20/23 128/76  08/17/22 120/84  04/30/22 124/76   Stable, pt to continue medical treatment coreg 6.25 bid,  hct 12.5 every day, losartan 25 qd   Insomnia Ok for refill ambien at 10mg  - 1.5 tab at bedtime prn  Followup: Return in about 6 months (around 07/22/2023).  Oliver Barre, MD 01/22/2023 8:52 PM [facility]

## 2023-01-22 ENCOUNTER — Encounter: Payer: Self-pay | Admitting: Internal Medicine

## 2023-01-22 NOTE — Assessment & Plan Note (Signed)
Last vitamin D Lab Results  Component Value Date   VD25OH 23.56 (L) 01/20/2023   Low to start oral replacement

## 2023-01-22 NOTE — Assessment & Plan Note (Signed)
Ok for refill ambien at 10mg  - 1.5 tab at bedtime prn

## 2023-01-22 NOTE — Assessment & Plan Note (Signed)
BP Readings from Last 3 Encounters:  01/20/23 128/76  08/17/22 120/84  04/30/22 124/76   Stable, pt to continue medical treatment coreg 6.25 bid, hct 12.5 every day, losartan 25 qd

## 2023-01-22 NOTE — Assessment & Plan Note (Signed)
Lab Results  Component Value Date   HGBA1C 9.7 (H) 01/20/2023   Chronic uncontrolled, pt to continue current medical treatment glucotrol xl 10 every day, actos 45 every day, and mounjaro 5 q wk, f/u endo as planned, declines other change for now

## 2023-01-22 NOTE — Assessment & Plan Note (Signed)
Lab Results  Component Value Date   LDLCALC 137 (H) 01/20/2023   Uncontrolled, encouraged compliance, to restart crestor 40 qd

## 2023-01-22 NOTE — Assessment & Plan Note (Signed)

## 2023-01-25 ENCOUNTER — Other Ambulatory Visit: Payer: Self-pay

## 2023-01-25 ENCOUNTER — Inpatient Hospital Stay: Payer: Medicare PPO | Attending: Hematology and Oncology | Admitting: Hematology and Oncology

## 2023-01-25 VITALS — BP 118/79 | HR 109 | Temp 97.2°F | Resp 16 | Wt 248.2 lb

## 2023-01-25 DIAGNOSIS — Z923 Personal history of irradiation: Secondary | ICD-10-CM | POA: Diagnosis not present

## 2023-01-25 DIAGNOSIS — Z823 Family history of stroke: Secondary | ICD-10-CM | POA: Insufficient documentation

## 2023-01-25 DIAGNOSIS — Z803 Family history of malignant neoplasm of breast: Secondary | ICD-10-CM | POA: Diagnosis not present

## 2023-01-25 DIAGNOSIS — Z880 Allergy status to penicillin: Secondary | ICD-10-CM | POA: Insufficient documentation

## 2023-01-25 DIAGNOSIS — Z9049 Acquired absence of other specified parts of digestive tract: Secondary | ICD-10-CM | POA: Diagnosis not present

## 2023-01-25 DIAGNOSIS — E1165 Type 2 diabetes mellitus with hyperglycemia: Secondary | ICD-10-CM | POA: Diagnosis not present

## 2023-01-25 DIAGNOSIS — Z87442 Personal history of urinary calculi: Secondary | ICD-10-CM | POA: Diagnosis not present

## 2023-01-25 DIAGNOSIS — Z79899 Other long term (current) drug therapy: Secondary | ICD-10-CM | POA: Diagnosis not present

## 2023-01-25 DIAGNOSIS — Z881 Allergy status to other antibiotic agents status: Secondary | ICD-10-CM | POA: Insufficient documentation

## 2023-01-25 DIAGNOSIS — E785 Hyperlipidemia, unspecified: Secondary | ICD-10-CM | POA: Diagnosis not present

## 2023-01-25 DIAGNOSIS — I10 Essential (primary) hypertension: Secondary | ICD-10-CM | POA: Diagnosis not present

## 2023-01-25 DIAGNOSIS — Z8042 Family history of malignant neoplasm of prostate: Secondary | ICD-10-CM | POA: Diagnosis not present

## 2023-01-25 DIAGNOSIS — Z8249 Family history of ischemic heart disease and other diseases of the circulatory system: Secondary | ICD-10-CM | POA: Diagnosis not present

## 2023-01-25 DIAGNOSIS — Z170 Estrogen receptor positive status [ER+]: Secondary | ICD-10-CM | POA: Insufficient documentation

## 2023-01-25 DIAGNOSIS — Z833 Family history of diabetes mellitus: Secondary | ICD-10-CM | POA: Insufficient documentation

## 2023-01-25 DIAGNOSIS — C50411 Malignant neoplasm of upper-outer quadrant of right female breast: Secondary | ICD-10-CM | POA: Insufficient documentation

## 2023-01-25 DIAGNOSIS — Z801 Family history of malignant neoplasm of trachea, bronchus and lung: Secondary | ICD-10-CM | POA: Diagnosis not present

## 2023-01-25 DIAGNOSIS — Z79810 Long term (current) use of selective estrogen receptor modulators (SERMs): Secondary | ICD-10-CM | POA: Insufficient documentation

## 2023-01-25 DIAGNOSIS — Z800 Family history of malignant neoplasm of digestive organs: Secondary | ICD-10-CM | POA: Insufficient documentation

## 2023-01-25 NOTE — Progress Notes (Signed)
[facility]  [phone] [phone]    ID: Kelly Rowe DOB: [birth year]  MR#: [id number]  JYN#:[id number]  Patient Care Team: Corwin Levins, MD as PCP - Joseph Pierini, MD as Consulting Physician (General Surgery) Lonie Peak, MD as Attending Physician (Radiation Oncology) Leonard Schwartz, PA-C as Referring Physician (Physician Assistant) Rodolph Bong, MD as Consulting Physician (Family Medicine) Olivia Mackie, NP as Nurse Practitioner (Gynecology)   CHIEF COMPLAINT: Triple positive breast cancer  CURRENT TREATMENT:  tamoxifen  INTERVAL HISTORY:  Kelly Rowe returns today for follow-up of her triple positive breast cancer. Patient has been taking tamoxifen as recommended.  She denies any new health complaints.  Hemoglobin A1c is also under 10, better controlled she states. HIV is well controlled, she is taking all her retroviral therapy. Since her last visit she denies any breast changes.  She had a mammogram in December which was unremarkable.  Rest of the pertinent 10 point ROS reviewed and negative   REVIEW OF SYSTEMS: Review of Systems  Constitutional:  Negative for appetite change, chills, fatigue, fever and unexpected weight change.  HENT:   Negative for hearing loss, lump/mass and trouble swallowing.   Eyes:  Negative for eye problems and icterus.  Respiratory:  Negative for chest tightness, cough and shortness of breath.   Cardiovascular:  Negative for chest pain, leg swelling and palpitations.  Gastrointestinal:  Negative for abdominal distention, abdominal pain, constipation, diarrhea, nausea and vomiting.  Endocrine: Negative for hot flashes.  Genitourinary:  Negative for difficulty urinating.   Musculoskeletal:  Negative for arthralgias.  Skin:  Negative for itching and rash.  Neurological:  Negative for dizziness, extremity weakness, headaches and numbness.  Hematological:  Negative for adenopathy. Does not  bruise/bleed easily.  Psychiatric/Behavioral:  Negative for depression. The patient is not nervous/anxious.       COVID 19 VACCINATION STATUS: Refuses vaccination, had COVID December 2021   HISTORY OF CURRENT ILLNESS: From the original intake note:  "Kelly Rowe" noticed bruising in her lateral right breast and palpated a mass  on 12/23/2017. She followed up with her gynecologist. She underwent unilateral right diagnostic mammography with tomography and right breast ultrasonography at [facility] on 01/05/2018 showing: breast density category B. There is an irregular highly suspicious mass within the right breast at the 9:30 o'clock upper outer quadrant, measuring 1.8 x 1.1 x 1.5 cm, and located 18 cm from the nipple,  Ultrasonography revealed a single morphologically abnormal lymph node in the RIGHT axilla, with cortical thickness of 6 mm.   Accordingly on 01/11/2018 she proceeded to biopsy of the right breast area in question as well as a suspicious lymph node. The pathology from this procedure showed ([id number]): Invasive ductal carcinoma grade III.  The lymph node biopsied was negative for carcinoma (concordant).. Prognostic indicators significant for: estrogen receptor, 60% positive with weak staining intensity and progesterone receptor, 10% positive with strong staining intensity. Proliferation marker Ki67 at 30%. HER2 amplified with ratios HER2/CEP17 signals 2.26 and average HER2 copies per cell 3.50  The patient's subsequent history is as detailed below.   PAST MEDICAL HISTORY: Past Medical History:  Diagnosis Date   ANEMIA-IRON DEFICIENCY 01/30/2010   ANXIETY 11/27/2007   Arthritis    Asthma 02/25/2011   Cancer (HCC)    breast   Carbuncle and furuncle of trunk 04/16/2010   Chlamydia infection 03/21/2008   DIABETES MELLITUS, TYPE II 08/02/2007   Edema 01/30/2010   ELEVATED BLOOD PRESSURE WITHOUT DIAGNOSIS OF HYPERTENSION 08/02/2007  Family history of breast cancer    Family history  of colon cancer    Family history of lung cancer    FREQUENCY, URINARY 05/19/2009   GENITAL HERPES 03/12/2009   GERD (gastroesophageal reflux disease)    History of kidney stones    History of radiation therapy 07/27/18- 09/06/18   Right Breast, Right Axillary and Supraclavicular nodes 50 Gy in 25 fractions, Right Breast Boost 10 Gy in 5 fractions   HIV (human immunodeficiency virus infection) (HCC) 2009   HIV INFECTION 03/12/2009   HSV (herpes simplex virus) infection    HYPERLIPIDEMIA 08/02/2007   Hypertension    Metrorrhagia 03/21/2008   PONV (postoperative nausea and vomiting)    woke up crying per patient    RETENTION, URINE 05/19/2009   Sleep apnea    Trichomonas infection 01/19/2010   VITAMIN D DEFICIENCY 01/30/2010    PAST SURGICAL HISTORY: Past Surgical History:  Procedure Laterality Date   ABDOMINAL HYSTERECTOMY  07/22/2010   TAH WITH PRESERVATION OF BOTH TUBES AND OVARIES   BREAST LUMPECTOMY Right 2019   BREAST LUMPECTOMY WITH RADIOACTIVE SEED AND SENTINEL LYMPH NODE BIOPSY Right 02/23/2018   Procedure: BREAST LUMPECTOMY WITH RADIOACTIVE SEED AND SENTINEL LYMPH NODE BIOPSY;  Surgeon: Almond Lint, MD;  Location: [facility] OR;  Service: General;  Laterality: Right;   CHOLECYSTECTOMY     ENDOMETRIAL ABLATION  01/11/2008   HER OPTION   ESOPHAGOGASTRODUODENOSCOPY     MULTIPLE TOOTH EXTRACTIONS     PORT-A-CATH REMOVAL Left 05/08/2019   Procedure: REMOVAL PORT-A-CATH;  Surgeon: Almond Lint, MD;  Location: [facility];  Service: General;  Laterality: Left;   PORTACATH PLACEMENT Left 02/23/2018   Procedure: INSERTION PORT-A-CATH;  Surgeon: Almond Lint, MD;  Location: [facility] OR;  Service: General;  Laterality: Left;   PORTACATH PLACEMENT N/A 04/21/2018   Procedure: PORT-A-CATH REVISION;  Surgeon: Almond Lint, MD;  Location: [facility];  Service: General;  Laterality: N/A;   TUBAL LIGATION     WISDOM TOOTH EXTRACTION      FAMILY HISTORY: Family History  Problem Relation Age of  Onset   Diabetes Mother    Hypertension Mother    Clotting disorder Mother    Diabetes Father    Cancer Father        COLON and LU NG   Colon cancer Father    Breast cancer Paternal Aunt    Lung cancer Paternal Aunt    Breast cancer Paternal Aunt    Breast cancer Paternal Aunt    Stroke Paternal Uncle    Lung cancer Maternal Grandmother 45       Mesothelioma   Heart attack Maternal Grandfather 4   Colon cancer Paternal Grandfather        dx over 7s   Prostate cancer Other    Heart disease Other    Stroke Other    Colon cancer Other        MGM's 5 brothers  The patient's father died at age 65 due to metastatic liver cancer. The patient's mother is alive at 20. The patient's has 1 brother and 3 sisters. There was a paternal grandfather with colon cancer. There were 3 paternal aunts with breast cancer, 2 diagnosed in the 9's and 1 at age 77. There was a maternal grandmother with mesothelioma. The patient otherwise denies a history of ovarian cancer in the family.    GYNECOLOGIC HISTORY:  Patient's last menstrual period was 06/21/2010. Menarche: 52 years old Age at first live birth: 52 years  old She is GXP5. Her LMP was December 2011. She is status post partial hysterectomy without oophorectomy. She took oral contraception for 3 years with no complications. She never took HRT.    SOCIAL HISTORY: (Updated January 2022). Kelly Rowe worked as a Surveyor, mining and a Lawyer.  She is currently applying for disability.  At home are 2 of her sons, Kelly Rowe and Kelly Rowe, and the patient's grandson, Kelly Rowe  (who is Willie's son). The patient's oldest is Kelly Rowe who works in [place] as a Financial risk analyst. Daughter, Kelly Rowe  works as a Conservation officer, nature. Son, Kelly Rowe is disabled. Son, Kelly Rowe works in [place] as a Data processing manager. Daughter, Kelly Rowe also is a Data processing manager. The patient has 5 grandchildren and no great grandchildren. The patient does not belong to a church.    ADVANCED DIRECTIVES: Not in place; at the 01/18/2018  visit the patient was given the appropriate documents to complete on notarized at her discretion   HEALTH MAINTENANCE: Social History   Tobacco Use   Smoking status: Never    Passive exposure: Current   Smokeless tobacco: Never  Vaping Use   Vaping Use: Never used  Substance Use Topics   Alcohol use: Yes    Alcohol/week: 0.0 standard drinks of alcohol    Comment: occ   Drug use: No     Colonoscopy: Not yet  PAP: November 2018  Bone density: Never   Allergies  Allergen Reactions   Metformin And Related Other (See Comments)    Bowel frequency   Levaquin [Levofloxacin In D5w] Nausea And Vomiting and Rash   Penicillins Swelling and Rash    Has patient had a PCN reaction causing immediate rash, facial/tongue/throat swelling, SOB or lightheadedness with hypotension: Yes Has patient had a PCN reaction causing severe rash involving mucus membranes or skin necrosis: No Has patient had a PCN reaction that required hospitalization: Yes Has patient had a PCN reaction occurring within the last 10 years: No If all of the above answers are "NO", then may proceed with Cephalosporin use.    Current Outpatient Medications  Medication Sig Dispense Refill   albuterol (VENTOLIN HFA) 108 (90 Base) MCG/ACT inhaler Inhale 2 puffs into the lungs every 6 (six) hours as needed for wheezing or shortness of breath. 1 g 11   ALPRAZolam (XANAX) 0.25 MG tablet Take by mouth.     Beclomethasone Dipropionate 80 MCG/ACT AERS Place into the nose.     bictegravir-emtricitabine-tenofovir AF (BIKTARVY) 50-200-25 MG TABS tablet Take 1 tablet by mouth daily.     blood glucose meter kit and supplies Dispense based on patient and insurance preference. Use up to four times daily as directed. (FOR ICD-10 E10.9, E11.9). 1 each 0   carvedilol (COREG) 6.25 MG tablet TAKE 1 TABLET(6.25 MG) BY MOUTH TWICE DAILY 60 tablet 11   Cholecalciferol (THERA-D 2000) 50 MCG (2000 UT) TABS 1 tab by mouth once daily 30 tablet 99    Diclofenac Sodium (PENNSAID) 2 % SOLN Place 1 application onto the skin 2 (two) times daily. 112 g 2   Doxepin HCl 3 MG TABS Take 1 tablet (3 mg total) by mouth at bedtime. 30 tablet 3   Ferrous Fumarate (HEMOCYTE - 106 MG FE) 324 (106 Fe) MG TABS tablet Take by mouth.     ferrous gluconate (FERGON) 240 (27 FE) MG tablet Take 240 mg by mouth daily as needed (iron).      fluconazole (DIFLUCAN) 150 MG tablet Take 1 tablet (150 mg total) by mouth every 3 (three) days. 2  tablet 0   FLUoxetine (PROZAC) 20 MG capsule Take 1 capsule by mouth once daily 90 capsule 2   gabapentin (NEURONTIN) 300 MG capsule Take 1 capsule (300 mg total) by mouth at bedtime. 90 capsule 4   glipiZIDE (GLUCOTROL XL) 10 MG 24 hr tablet TAKE 1 TABLET(10 MG) BY MOUTH DAILY WITH BREAKFAST 90 tablet 3   glucose blood (ONETOUCH VERIO) test strip USE 1 STRIP TO CHECK GLUCOSE 4 TIMES DAILY AS DIRECTED. DX: E11.69 100 each 11   hydrochlorothiazide (MICROZIDE) 12.5 MG capsule Take 1 capsule by mouth daily.     hydrocortisone (ANUSOL-HC) 2.5 % rectal cream Place 1 application rectally 2 (two) times daily. 30 g 1   ibuprofen (ADVIL,MOTRIN) 800 MG tablet Take 1 tablet (800 mg total) by mouth every 8 (eight) hours as needed. 60 tablet 0   Insulin Pen Needle (PEN NEEDLES) 32G X 4 MM MISC Use with insulin pen 100 each 12   Lancets (ONETOUCH DELICA PLUS LANCET33G) MISC USE TO TEST BLOOD SUGAR UP TO 4 TIMES DAILY 100 each 5   loratadine (CLARITIN) 10 MG tablet Take 10 mg by mouth daily.     losartan (COZAAR) 25 MG tablet Take 1 tablet (25 mg total) by mouth daily. 30 tablet 11   metroNIDAZOLE (FLAGYL) 500 MG tablet Take 1 tablet (500 mg) by mouth 2 times daily for 7 days 14 tablet 0   MOUNJARO 5 MG/0.5ML Pen INJECT 5 MG INTO THE SKIN ONCE A WEEK 12 mL 0   Multiple Vitamin (MULTIVITAMIN WITH MINERALS) TABS tablet Take 1 tablet by mouth daily.     nitroGLYCERIN (NITRODUR - DOSED IN MG/24 HR) 0.2 mg/hr patch Apply 1/4 patch daily to tendon for  tendonitis. 30 patch 1   nystatin-triamcinolone ointment (MYCOLOG) Apply 1 Application topically 2 (two) times daily. 30 g 0   pioglitazone (ACTOS) 45 MG tablet Take 1 tablet (45 mg total) by mouth daily. 90 tablet 3   prochlorperazine (COMPAZINE) 10 MG tablet Take by mouth.     rosuvastatin (CRESTOR) 40 MG tablet Take 1 tablet (40 mg total) by mouth daily. 90 tablet 3   scopolamine (TRANSDERM-SCOP) 1 MG/3DAYS Place 1 patch (1.5 mg total) onto the skin every 3 (three) days. 10 patch 12   Suvorexant (BELSOMRA) 15 MG TABS Take 1 tablet by mouth at bedtime as needed. 30 tablet 5   tamoxifen (NOLVADEX) 20 MG tablet Take 1 tablet by mouth once daily 90 tablet 4   tirzepatide (MOUNJARO) 5 MG/0.5ML Pen Inject into the skin.     triamcinolone (NASACORT) 55 MCG/ACT AERO nasal inhaler Place 2 sprays into the nose daily. 1 each 12   valACYclovir (VALTREX) 500 MG tablet Take twice daily for 3-5 days 30 tablet 2   zolpidem (AMBIEN) 10 MG tablet Take by mouth.     zolpidem (AMBIEN) 10 MG tablet 1 and 1/2 tab by mouth at bedtime as needed for sleep 135 tablet 1   No current facility-administered medications for this visit.    OBJECTIVE:  Vitals:   01/25/23 1544  BP: 118/79  Pulse: (!) 109  Resp: 16  Temp: (!) 97.2 F (36.2 C)  SpO2: 96%      Body mass index is 47.68 kg/m.   Wt Readings from Last 3 Encounters:  01/25/23 248 lb 3.2 oz (112.6 kg)  01/20/23 246 lb (111.6 kg)  08/17/22 251 lb (113.9 kg)  ECOG FS: 2 - Symptomatic, <50% confined to bed  Physical Exam Constitutional:  Appearance: Normal appearance.  Cardiovascular:     Rate and Rhythm: Normal rate and regular rhythm.  Pulmonary:     Effort: Pulmonary effort is normal.     Breath sounds: Normal breath sounds.  Chest:     Comments: Bilateral breasts inspected.  She has large pendulous breast.  Right breast with postop and postradiation changes, no significant change since completion of treatment.  No new palpable masses.  Left  breast normal to inspection.  No new palpable masses.  No regional adenopathy. Musculoskeletal:     Cervical back: Normal range of motion and neck supple. No rigidity.  Lymphadenopathy:     Cervical: No cervical adenopathy.  Neurological:     Mental Status: She is alert.       LAB RESULTS:  CMP     Component Value Date/Time   NA 135 01/20/2023 1510   K 4.2 01/20/2023 1510   CL 99 01/20/2023 1510   CO2 28 01/20/2023 1510   GLUCOSE 160 (H) 01/20/2023 1510   BUN 10 01/20/2023 1510   CREATININE 0.76 01/20/2023 1510   CREATININE 0.73 01/21/2022 1134   CALCIUM 9.5 01/20/2023 1510   PROT 7.6 01/20/2023 1510   ALBUMIN 4.3 01/20/2023 1510   AST 24 01/20/2023 1510   AST 20 01/21/2022 1134   ALT 21 01/20/2023 1510   ALT 29 01/21/2022 1134   ALKPHOS 110 01/20/2023 1510   BILITOT 0.5 01/20/2023 1510   BILITOT 0.3 01/21/2022 1134   GFRNONAA >60 01/21/2022 1134   GFRAA >60 04/16/2020 1119   GFRAA >60 10/11/2019 1054    No results found for: "TOTALPROTELP", "ALBUMINELP", "A1GS", "A2GS", "BETS", "BETA2SER", "GAMS", "MSPIKE", "SPEI"  No results found for: "KPAFRELGTCHN", "LAMBDASER", "KAPLAMBRATIO"  Lab Results  Component Value Date   WBC 3.2 (L) 01/20/2023   NEUTROABS 1.1 (L) 01/20/2023   HGB 13.6 01/20/2023   HCT 42.6 01/20/2023   MCV 87.4 01/20/2023   PLT 237.0 01/20/2023   No results found for: "LABCA2"  No components found for: "WUJWJX914"  No results for input(s): "INR" in the last 168 hours.  No results found for: "LABCA2"  No results found for: "NWG956"  No results found for: "CAN125"  No results found for: "CAN153"  No results found for: "CA2729"  No components found for: "HGQUANT"  No results found for: "CEA1", "CEA" / No results found for: "CEA1", "CEA"   No results found for: "AFPTUMOR"  No results found for: "CHROMOGRNA"  No results found for: "HGBA", "HGBA2QUANT", "HGBFQUANT", "HGBSQUAN" (Hemoglobinopathy evaluation)   No results found for:  "LDH"  Lab Results  Component Value Date   IRON 75 10/03/2019   IRONPCTSAT 20.9 10/03/2019   (Iron and TIBC)  No results found for: "FERRITIN"  Urinalysis    Component Value Date/Time   COLORURINE YELLOW 01/20/2023 1510   APPEARANCEUR Cloudy (A) 01/20/2023 1510   LABSPEC >=1.030 (A) 01/20/2023 1510   PHURINE 5.5 01/20/2023 1510   GLUCOSEU NEGATIVE 01/20/2023 1510   HGBUR NEGATIVE 01/20/2023 1510   BILIRUBINUR SMALL (A) 01/20/2023 1510   KETONESUR TRACE (A) 01/20/2023 1510   PROTEINUR NEGATIVE 03/10/2021 1036   UROBILINOGEN 0.2 01/20/2023 1510   NITRITE NEGATIVE 01/20/2023 1510   LEUKOCYTESUR NEGATIVE 01/20/2023 1510    STUDIES: No results found.   ELIGIBLE FOR AVAILABLE RESEARCH PROTOCOL: no   ASSESSMENT: 52 y.o. [place], [place] woman status post right breast upper outer quadrant biopsy 01/11/2018 for a clinical T1 N0, stage IA invasive ductal carcinoma, grade 3, estrogen and progesterone receptor positive,  HER-2 amplified, with an MIB-1 of 30%.  (1) status post right lumpectomy and sentinel lymph node sampling 02/23/2018 for a pT2 pN1, stage IB invasive ductal carcinoma, grade 3, with close but negative margins  (2) adjuvant chemotherapy consisting of carboplatin, docetaxel, trastuzumab and Pertuzumab every 21 days x 6 given between 03/08/2018-07/05/2018 (a) pertuzumab stopped after cycle 1 due to diarrhea (b) docetaxel changed to gemcitabine after cycle 2 due to persistent hyperglycemia (c) Gemcitabine and Carboplatin dose reduced by approximately 10% due to delayed neutropenia and thrombocytopenia  (3) anti-HER-2 immunotherapy started concurrently with chemotherapy (a) baseline echocardiogram on 02/07/2018 showed an ejection fraction in the 55-60% range (b) repeat echo 06/19/2018 showed an ejection fraction of 55-60% (c) repeat echocardiogram 11/14/2018 showed an EF in the 50-55% range, with normal strain. (d) echo 01/31/2019 shows an ejection fraction in the 55/60%  range (e) final trastuzumab dose 03/12/2019  (4) adjuvant radiation 07/27/2018 - 09/06/2018  Site/dose: 1. Right Breast / 50 Gy in 25 fractions    2. Right Supraclavicular nodes / 50 Gy in 25 fractions    3. Right Breast Boost / 10 Gy in 5 fractions  (5) tamoxifen started November 2020  (6) genetics testing through [facility] Multi-cancer and Breast panel on 01/13/2018 showed: no deleterious mutations. The following genes were evaluated for sequence changes and exonic deletions/duplications: ALK, APC, ATM, AXIN2, BAP1, BARD1, BLM, BMPR1A, BRCA1, BRCA2, BRIP1, CASR, CDC73, CDH1, CDK4, CDKN1B, CDKN1C,CDKN2A (p14ARF), CDKN2A (p16INK4a), CEBPA, CHEK2, CTNNA1, DICER1, DIS3L2, EPCAM*, FH, FLCN, GATA2, GPC3, GREM1*, HRAS, KIT, MAX, MEN1, MET, MLH1, MSH2, MSH3, MSH6, MUTYH, NBN, NF1, NF2, PALB2, PDGFRA, PHOX2B*, PMS2, POLD1,POLE, POT1, PRKAR1A, PTCH1, PTEN, RAD50, RAD51C, RAD51D, RB1, RECQL4, RET, RUNX1, SDHAF2, SDHB, SDHC, SDHD,SMAD4, SMARCA4, SMARCB1, SMARCE1, STK11, SUFU, TERC, TERT, TMEM127, TP53, TSC1, TSC2, VHL, WRN*, WT1.The following genes were evaluated for sequence changes only: EGFR*, HOXB13*, MITF*, NTHL1*, SDHA  (7) Mammogram December 2022 with no evidence of malignancy.   PLAN:  Junne is here for follow-up of her estrogen positive breast cancer.  She has no clinical or radiographic sign of breast cancer recurrence.  She continues on tamoxifen with good tolerance.   She will complete tamoxifen Nov 2025.  At this time there is no concern for recurrence based on review of systems and physical exam. Mammogram ordered for December 2025.  Total encounter time 20 minutes.*In face-to-face visit time, chart review, lab review, order entry, care coordination, and documentation of the encounter.  *Total Encounter Time as defined by the Centers for Medicare and Medicaid Services includes, in addition to the face-to-face time of a patient visit (documented in the note above) non-face-to-face time:  obtaining and reviewing outside history, ordering and reviewing medications, tests or procedures, care coordination (communications with other health care professionals or caregivers) and documentation in the medical record.

## 2023-02-02 ENCOUNTER — Telehealth: Payer: Self-pay | Admitting: Internal Medicine

## 2023-02-02 ENCOUNTER — Other Ambulatory Visit (HOSPITAL_COMMUNITY): Payer: Self-pay

## 2023-02-02 MED ORDER — TIRZEPATIDE 7.5 MG/0.5ML ~~LOC~~ SOAJ
7.5000 mg | SUBCUTANEOUS | 3 refills | Status: DC
  Filled 2023-02-02: qty 2, 28d supply, fill #0

## 2023-02-02 NOTE — Telephone Encounter (Signed)
Ok done erx to [facility]

## 2023-02-02 NOTE — Telephone Encounter (Signed)
Patient called and wants her monjaro increased to the 7.5 mg.

## 2023-02-09 DIAGNOSIS — B20 Human immunodeficiency virus [HIV] disease: Secondary | ICD-10-CM | POA: Diagnosis not present

## 2023-02-09 DIAGNOSIS — Z79899 Other long term (current) drug therapy: Secondary | ICD-10-CM | POA: Diagnosis not present

## 2023-02-09 DIAGNOSIS — Z23 Encounter for immunization: Secondary | ICD-10-CM | POA: Diagnosis not present

## 2023-02-14 ENCOUNTER — Other Ambulatory Visit (HOSPITAL_COMMUNITY): Payer: Self-pay

## 2023-02-17 NOTE — Telephone Encounter (Signed)
Pt wanted the Rx sent to [facility]  [address], [place], [place] [address]  [phone]

## 2023-05-28 ENCOUNTER — Other Ambulatory Visit: Payer: Self-pay | Admitting: Internal Medicine

## 2023-06-15 ENCOUNTER — Other Ambulatory Visit: Payer: Self-pay | Admitting: Internal Medicine

## 2023-06-17 ENCOUNTER — Ambulatory Visit (INDEPENDENT_AMBULATORY_CARE_PROVIDER_SITE_OTHER): Payer: Medicare PPO

## 2023-06-17 ENCOUNTER — Ambulatory Visit: Payer: Medicare PPO | Admitting: Internal Medicine

## 2023-06-17 ENCOUNTER — Encounter: Payer: Self-pay | Admitting: Internal Medicine

## 2023-06-17 VITALS — BP 122/76 | HR 85 | Temp 98.9°F | Ht 60.5 in | Wt 241.0 lb

## 2023-06-17 DIAGNOSIS — Z794 Long term (current) use of insulin: Secondary | ICD-10-CM

## 2023-06-17 DIAGNOSIS — E7800 Pure hypercholesterolemia, unspecified: Secondary | ICD-10-CM | POA: Diagnosis not present

## 2023-06-17 DIAGNOSIS — E1169 Type 2 diabetes mellitus with other specified complication: Secondary | ICD-10-CM

## 2023-06-17 DIAGNOSIS — H538 Other visual disturbances: Secondary | ICD-10-CM | POA: Diagnosis not present

## 2023-06-17 DIAGNOSIS — Z7984 Long term (current) use of oral hypoglycemic drugs: Secondary | ICD-10-CM | POA: Diagnosis not present

## 2023-06-17 DIAGNOSIS — R1010 Upper abdominal pain, unspecified: Secondary | ICD-10-CM

## 2023-06-17 DIAGNOSIS — Z7985 Long-term (current) use of injectable non-insulin antidiabetic drugs: Secondary | ICD-10-CM

## 2023-06-17 DIAGNOSIS — I10 Essential (primary) hypertension: Secondary | ICD-10-CM | POA: Diagnosis not present

## 2023-06-17 LAB — CBC WITH DIFFERENTIAL/PLATELET
Basophils Absolute: 0 10*3/uL (ref 0.0–0.1)
Basophils Relative: 0.6 % (ref 0.0–3.0)
Eosinophils Absolute: 0 10*3/uL (ref 0.0–0.7)
Eosinophils Relative: 0.8 % (ref 0.0–5.0)
HCT: 42.1 % (ref 36.0–46.0)
Hemoglobin: 13.6 g/dL (ref 12.0–15.0)
Lymphocytes Relative: 47 % — ABNORMAL HIGH (ref 12.0–46.0)
Lymphs Abs: 1.6 10*3/uL (ref 0.7–4.0)
MCHC: 32.3 g/dL (ref 30.0–36.0)
MCV: 88.6 fL (ref 78.0–100.0)
Monocytes Absolute: 0.3 10*3/uL (ref 0.1–1.0)
Monocytes Relative: 8.4 % (ref 3.0–12.0)
Neutro Abs: 1.4 10*3/uL (ref 1.4–7.7)
Neutrophils Relative %: 43.2 % (ref 43.0–77.0)
Platelets: 241 10*3/uL (ref 150.0–400.0)
RBC: 4.75 Mil/uL (ref 3.87–5.11)
RDW: 13.7 % (ref 11.5–15.5)
WBC: 3.3 10*3/uL — ABNORMAL LOW (ref 4.0–10.5)

## 2023-06-17 LAB — HEPATIC FUNCTION PANEL
ALT: 20 U/L (ref 0–35)
AST: 22 U/L (ref 0–37)
Albumin: 4.4 g/dL (ref 3.5–5.2)
Alkaline Phosphatase: 107 U/L (ref 39–117)
Bilirubin, Direct: 0 mg/dL (ref 0.0–0.3)
Total Bilirubin: 0.5 mg/dL (ref 0.2–1.2)
Total Protein: 7.4 g/dL (ref 6.0–8.3)

## 2023-06-17 LAB — URINALYSIS, ROUTINE W REFLEX MICROSCOPIC
Bilirubin Urine: NEGATIVE
Hgb urine dipstick: NEGATIVE
Ketones, ur: NEGATIVE
Leukocytes,Ua: NEGATIVE
Nitrite: NEGATIVE
Specific Gravity, Urine: 1.02 (ref 1.000–1.030)
Total Protein, Urine: NEGATIVE
Urine Glucose: NEGATIVE
Urobilinogen, UA: 0.2 (ref 0.0–1.0)
pH: 6 (ref 5.0–8.0)

## 2023-06-17 LAB — HEMOGLOBIN A1C: Hgb A1c MFr Bld: 8.3 % — ABNORMAL HIGH (ref 4.6–6.5)

## 2023-06-17 LAB — TSH: TSH: 3.15 u[IU]/mL (ref 0.35–5.50)

## 2023-06-17 LAB — LIPID PANEL
Cholesterol: 211 mg/dL — ABNORMAL HIGH (ref 0–200)
HDL: 48.8 mg/dL (ref >39.00)
LDL Cholesterol: 141 mg/dL — ABNORMAL HIGH (ref 0–99)
NonHDL: 162.57
Total CHOL/HDL Ratio: 4
Triglycerides: 106 mg/dL (ref 0.0–149.0)
VLDL: 21.2 mg/dL (ref 0.0–40.0)

## 2023-06-17 LAB — MICROALBUMIN / CREATININE URINE RATIO
Creatinine,U: 104 mg/dL
Microalb Creat Ratio: 0.7 mg/g (ref 0.0–30.0)
Microalb, Ur: <0.7 mg/dL (ref 0.0–1.9)

## 2023-06-17 LAB — BASIC METABOLIC PANEL
BUN: 13 mg/dL (ref 6–23)
CO2: 30 meq/L (ref 19–32)
Calcium: 9.5 mg/dL (ref 8.4–10.5)
Chloride: 99 meq/L (ref 96–112)
Creatinine, Ser: 0.73 mg/dL (ref 0.40–1.20)
GFR: 93.2 mL/min (ref >60.00)
Glucose, Bld: 157 mg/dL — ABNORMAL HIGH (ref 70–99)
Potassium: 3.9 meq/L (ref 3.5–5.1)
Sodium: 137 meq/L (ref 135–145)

## 2023-06-17 LAB — LIPASE: Lipase: 43 U/L (ref 11.0–59.0)

## 2023-06-17 MED ORDER — LINACLOTIDE 145 MCG PO CAPS: 145.0000 ug | ORAL_CAPSULE | Freq: Every day | ORAL | 5 refills | Status: DC

## 2023-06-17 MED ORDER — TIRZEPATIDE 7.5 MG/0.5ML ~~LOC~~ SOAJ: 7.5000 mg | SUBCUTANEOUS | 3 refills | Status: DC

## 2023-06-17 MED ORDER — TIRZEPATIDE 10 MG/0.5ML ~~LOC~~ SOAJ: 10.0000 mg | SUBCUTANEOUS | 3 refills | Status: DC

## 2023-06-17 NOTE — Assessment & Plan Note (Signed)
BP Readings from Last 3 Encounters:  06/17/23 122/76  01/25/23 118/79  01/20/23 128/76   Stable, pt to continue medical treatment coreg 6.25 bid, hxt 12.5 every day, losartan 25 qd

## 2023-06-17 NOTE — Assessment & Plan Note (Signed)
Lab Results  Component Value Date   LDLCALC 141 (H) 06/17/2023   uncontrolled, pt to restart crestor 40 qd

## 2023-06-17 NOTE — Assessment & Plan Note (Signed)
For repeat optho referral as pt c/o persistnet symptoms and current referral appt is in mar 2025, hopeful to have a new one soonner

## 2023-06-17 NOTE — Patient Instructions (Addendum)
Please take all new medication as prescribed - the linzess as needed for constipation  Ok to increase the mounjaro to 10 mg weekly  Please continue all other medications as before, and refills have been done if requested.  Please have the pharmacy call with any other refills you may need.  Please continue your efforts at being more active, low cholesterol diet, and weight control.  Please keep your appointments with your specialists as you may have planned  You will be contacted regarding the referral for: eye doctor if can be done before mar 2025  Please go to the XRAY Department in the first floor for the x-ray testing  Please go to the LAB at the blood drawing area for the tests to be done  You will be contacted by phone if any changes need to be made immediately.  Otherwise, you will receive a letter about your results with an explanation, but please check with MyChart first.  Please make an Appointment to return in 4 months, or sooner if needed

## 2023-06-17 NOTE — Assessment & Plan Note (Signed)
Etiology unclear, for acute abd films, labs including cbc and ua, and add linzess 145 mg prn constipatoin

## 2023-06-17 NOTE — Assessment & Plan Note (Signed)
Lab Results  Component Value Date   HGBA1C 8.3 (H) 06/17/2023   Uncontrolled, with obesity, pt to continue current medical treatment glipizide xl 10 every day, actos 45 every day, and increased mounjaro 10 mg for further wt loss

## 2023-06-17 NOTE — Progress Notes (Signed)
Patient ID: Kelly Rowe, female   DOB: 01/08/1971, 52 y.o.   MRN: [id number]        Chief Complaint: follow up upper abd pain, dm with obesity, blurred vision       HPI:  Kelly Rowe is a 52 y.o. female here with c/o upper abd pain x 4 wks mild to start, then mod more constant in the last 2 wks, starting after wearing a tight belt around the body at the upper abd, but also has had more constipation tha usual, with miralax not working well.  Pt denies chest pain, increased sob or doe, wheezing, orthopnea, PND, increased LE swelling, palpitations, dizziness or syncope.   Pt denies polydipsia, polyuria, or new focal neuro s/s.   Denies worsening reflux, abd pain, dysphagia, n/v, diarrhea or blood.       Wt Readings from Last 3 Encounters:  06/17/23 241 lb (109.3 kg)  01/25/23 248 lb 3.2 oz (112.6 kg)  01/20/23 246 lb (111.6 kg)   BP Readings from Last 3 Encounters:  06/17/23 122/76  01/25/23 118/79  01/20/23 128/76         Past Medical History:  Diagnosis Date   ANEMIA-IRON DEFICIENCY 01/30/2010   ANXIETY 11/27/2007   Arthritis    Asthma 02/25/2011   Cancer (HCC)    breast   Carbuncle and furuncle of trunk 04/16/2010   Chlamydia infection 03/21/2008   DIABETES MELLITUS, TYPE II 08/02/2007   Edema 01/30/2010   ELEVATED BLOOD PRESSURE WITHOUT DIAGNOSIS OF HYPERTENSION 08/02/2007   Family history of breast cancer    Family history of colon cancer    Family history of lung cancer    FREQUENCY, URINARY 05/19/2009   GENITAL HERPES 03/12/2009   GERD (gastroesophageal reflux disease)    History of kidney stones    History of radiation therapy 07/27/18- 09/06/18   Right Breast, Right Axillary and Supraclavicular nodes 50 Gy in 25 fractions, Right Breast Boost 10 Gy in 5 fractions   HIV (human immunodeficiency virus infection) (HCC) 2009   HIV INFECTION 03/12/2009   HSV (herpes simplex virus) infection    HYPERLIPIDEMIA 08/02/2007   Hypertension    Metrorrhagia 03/21/2008   PONV  (postoperative nausea and vomiting)    woke up crying per patient    RETENTION, URINE 05/19/2009   Sleep apnea    Trichomonas infection 01/19/2010   VITAMIN D DEFICIENCY 01/30/2010   Past Surgical History:  Procedure Laterality Date   ABDOMINAL HYSTERECTOMY  07/22/2010   TAH WITH PRESERVATION OF BOTH TUBES AND OVARIES   BREAST LUMPECTOMY Right 2019   BREAST LUMPECTOMY WITH RADIOACTIVE SEED AND SENTINEL LYMPH NODE BIOPSY Right 02/23/2018   Procedure: BREAST LUMPECTOMY WITH RADIOACTIVE SEED AND SENTINEL LYMPH NODE BIOPSY;  Surgeon: Almond Lint, MD;  Location: [facility];  Service: General;  Laterality: Right;   CHOLECYSTECTOMY     ENDOMETRIAL ABLATION  01/11/2008   HER OPTION   ESOPHAGOGASTRODUODENOSCOPY     MULTIPLE TOOTH EXTRACTIONS     PORT-A-CATH REMOVAL Left 05/08/2019   Procedure: REMOVAL PORT-A-CATH;  Surgeon: Almond Lint, MD;  Location: [facility];  Service: General;  Laterality: Left;   PORTACATH PLACEMENT Left 02/23/2018   Procedure: INSERTION PORT-A-CATH;  Surgeon: Almond Lint, MD;  Location: [facility];  Service: General;  Laterality: Left;   PORTACATH PLACEMENT N/A 04/21/2018   Procedure: PORT-A-CATH REVISION;  Surgeon: Almond Lint, MD;  Location: [facility];  Service: General;  Laterality: N/A;   TUBAL LIGATION  WISDOM TOOTH EXTRACTION      reports that she has never smoked. She has been exposed to tobacco smoke. She has never used smokeless tobacco. She reports current alcohol use. She reports that she does not use drugs. family history includes Breast cancer in her paternal aunt, paternal aunt, and paternal aunt; Cancer in her father; Clotting disorder in her mother; Colon cancer in her father, paternal grandfather, and another family member; Diabetes in her father and mother; Heart attack (age of onset: 56) in her maternal grandfather; Heart disease in an other family member; Hypertension in her mother; Lung cancer in her paternal aunt; Lung cancer (age of onset: 29)  in her maternal grandmother; Prostate cancer in an other family member; Stroke in her paternal uncle and another family member. Allergies  Allergen Reactions   Metformin And Related Other (See Comments)    Bowel frequency   Levaquin [Levofloxacin In D5w] Nausea And Vomiting and Rash   Penicillins Swelling and Rash    Has patient had a PCN reaction causing immediate rash, facial/tongue/throat swelling, SOB or lightheadedness with hypotension: Yes Has patient had a PCN reaction causing severe rash involving mucus membranes or skin necrosis: No Has patient had a PCN reaction that required hospitalization: Yes Has patient had a PCN reaction occurring within the last 10 years: No If all of the above answers are "NO", then may proceed with Cephalosporin use.   Current Outpatient Medications on File Prior to Visit  Medication Sig Dispense Refill   albuterol (VENTOLIN HFA) 108 (90 Base) MCG/ACT inhaler Inhale 2 puffs into the lungs every 6 (six) hours as needed for wheezing or shortness of breath. 1 g 11   ALPRAZolam (XANAX) 0.25 MG tablet Take by mouth.     Beclomethasone Dipropionate 80 MCG/ACT AERS Place into the nose.     bictegravir-emtricitabine-tenofovir AF (BIKTARVY) 50-200-25 MG TABS tablet Take 1 tablet by mouth daily.     blood glucose meter kit and supplies Dispense based on patient and insurance preference. Use up to four times daily as directed. (FOR ICD-10 E10.9, E11.9). 1 each 0   carvedilol (COREG) 6.25 MG tablet TAKE 1 TABLET(6.25 MG) BY MOUTH TWICE DAILY 60 tablet 11   Cholecalciferol (THERA-D 2000) 50 MCG (2000 UT) TABS 1 tab by mouth once daily 30 tablet 99   Diclofenac Sodium (PENNSAID) 2 % SOLN Place 1 application onto the skin 2 (two) times daily. 112 g 2   Doxepin HCl 3 MG TABS Take 1 tablet (3 mg total) by mouth at bedtime. 30 tablet 3   Ferrous Fumarate (HEMOCYTE - 106 MG FE) 324 (106 Fe) MG TABS tablet Take by mouth.     ferrous gluconate (FERGON) 240 (27 FE) MG tablet  Take 240 mg by mouth daily as needed (iron).      fluconazole (DIFLUCAN) 150 MG tablet Take 1 tablet (150 mg total) by mouth every 3 (three) days. 2 tablet 0   FLUoxetine (PROZAC) 20 MG capsule Take 1 capsule by mouth once daily 90 capsule 2   gabapentin (NEURONTIN) 300 MG capsule Take 1 capsule (300 mg total) by mouth at bedtime. 90 capsule 4   glipiZIDE (GLUCOTROL XL) 10 MG 24 hr tablet TAKE 1 TABLET(10 MG) BY MOUTH DAILY WITH BREAKFAST 90 tablet 3   glucose blood (ONETOUCH VERIO) test strip USE 1 STRIP TO CHECK GLUCOSE 4 TIMES DAILY AS DIRECTED. DX: E11.69 100 each 11   hydrochlorothiazide (MICROZIDE) 12.5 MG capsule Take 1 capsule by mouth daily.  hydrocortisone (ANUSOL-HC) 2.5 % rectal cream Place 1 application rectally 2 (two) times daily. 30 g 1   ibuprofen (ADVIL,MOTRIN) 800 MG tablet Take 1 tablet (800 mg total) by mouth every 8 (eight) hours as needed. 60 tablet 0   Insulin Pen Needle (PEN NEEDLES) 32G X 4 MM MISC Use with insulin pen 100 each 12   Lancets (ONETOUCH DELICA PLUS LANCET33G) MISC USE TO TEST BLOOD SUGAR UP TO 4 TIMES DAILY 100 each 5   loratadine (CLARITIN) 10 MG tablet Take 10 mg by mouth daily.     losartan (COZAAR) 25 MG tablet Take 1 tablet (25 mg total) by mouth daily. 30 tablet 11   metroNIDAZOLE (FLAGYL) 500 MG tablet Take 1 tablet (500 mg) by mouth 2 times daily for 7 days 14 tablet 0   Multiple Vitamin (MULTIVITAMIN WITH MINERALS) TABS tablet Take 1 tablet by mouth daily.     nitroGLYCERIN (NITRODUR - DOSED IN MG/24 HR) 0.2 mg/hr patch Apply 1/4 patch daily to tendon for tendonitis. 30 patch 1   nystatin-triamcinolone ointment (MYCOLOG) Apply 1 Application topically 2 (two) times daily. 30 g 0   pioglitazone (ACTOS) 45 MG tablet Take 1 tablet (45 mg total) by mouth daily. 90 tablet 3   prochlorperazine (COMPAZINE) 10 MG tablet Take by mouth.     rosuvastatin (CRESTOR) 40 MG tablet Take 1 tablet (40 mg total) by mouth daily. 90 tablet 3   scopolamine  (TRANSDERM-SCOP) 1 MG/3DAYS Place 1 patch (1.5 mg total) onto the skin every 3 (three) days. 10 patch 12   Suvorexant (BELSOMRA) 15 MG TABS Take 1 tablet by mouth at bedtime as needed. 30 tablet 5   tamoxifen (NOLVADEX) 20 MG tablet Take 1 tablet by mouth once daily 90 tablet 4   triamcinolone (NASACORT) 55 MCG/ACT AERO nasal inhaler Place 2 sprays into the nose daily. 1 each 12   valACYclovir (VALTREX) 500 MG tablet Take twice daily for 3-5 days 30 tablet 2   zolpidem (AMBIEN) 10 MG tablet Take by mouth.     zolpidem (AMBIEN) 10 MG tablet 1 and 1/2 tab by mouth at bedtime as needed for sleep 135 tablet 1   No current facility-administered medications on file prior to visit.        ROS:  All others reviewed and negative.  Objective        PE:  BP 122/76 (BP Location: Left Arm, Patient Position: Sitting, Cuff Size: Normal)   Pulse 85   Temp 98.9 F (37.2 C) (Oral)   Ht 5' 0.5" (1.537 m)   Wt 241 lb (109.3 kg)   LMP 06/21/2010   SpO2 98%   BMI 46.29 kg/m                 Constitutional: Pt appears in NAD               HENT: Head: NCAT.                Right Ear: External ear normal.                 Left Ear: External ear normal.                Eyes: . Pupils are equal, round, and reactive to light. Conjunctivae and EOM are normal               Nose: without d/c or deformity  Neck: Neck supple. Gross normal ROM               Cardiovascular: Normal rate and regular rhythm.                 Pulmonary/Chest: Effort normal and breath sounds without rales or wheezing.                Abd:  Soft, NT, ND, + BS, no organomegaly               Neurological: Pt is alert. At baseline orientation, motor grossly intact               Skin: Skin is warm. No rashes, no other new lesions, LE edema - none               Psychiatric: Pt behavior is normal without agitation   Micro: none  Cardiac tracings I have personally interpreted today:  none  Pertinent Radiological findings  (summarize): none   Lab Results  Component Value Date   WBC 3.3 (L) 06/17/2023   HGB 13.6 06/17/2023   HCT 42.1 06/17/2023   PLT 241.0 06/17/2023   GLUCOSE 157 (H) 06/17/2023   CHOL 211 (H) 06/17/2023   TRIG 106.0 06/17/2023   HDL 48.80 06/17/2023   LDLDIRECT 143.0 10/03/2018   LDLCALC 141 (H) 06/17/2023   ALT 20 06/17/2023   AST 22 06/17/2023   NA 137 06/17/2023   K 3.9 06/17/2023   CL 99 06/17/2023   CREATININE 0.73 06/17/2023   BUN 13 06/17/2023   CO2 30 06/17/2023   TSH 3.15 06/17/2023   HGBA1C 8.3 (H) 06/17/2023   MICROALBUR <0.7 06/17/2023   Assessment/Plan:  Kelly Rowe is a 52 y.o. Black or African American [2] female with  has a past medical history of ANEMIA-IRON DEFICIENCY (01/30/2010), ANXIETY (11/27/2007), Arthritis, Asthma (02/25/2011), Cancer (HCC), Carbuncle and furuncle of trunk (04/16/2010), Chlamydia infection (03/21/2008), DIABETES MELLITUS, TYPE II (08/02/2007), Edema (01/30/2010), ELEVATED BLOOD PRESSURE WITHOUT DIAGNOSIS OF HYPERTENSION (08/02/2007), Family history of breast cancer, Family history of colon cancer, Family history of lung cancer, FREQUENCY, URINARY (05/19/2009), GENITAL HERPES (03/12/2009), GERD (gastroesophageal reflux disease), History of kidney stones, History of radiation therapy (07/27/18- 09/06/18), HIV (human immunodeficiency virus infection) (HCC) (2009), HIV INFECTION (03/12/2009), HSV (herpes simplex virus) infection, HYPERLIPIDEMIA (08/02/2007), Hypertension, Metrorrhagia (03/21/2008), PONV (postoperative nausea and vomiting), RETENTION, URINE (05/19/2009), Sleep apnea, Trichomonas infection (01/19/2010), and VITAMIN D DEFICIENCY (01/30/2010).  Type 2 diabetes mellitus with other specified complication (HCC) Lab Results  Component Value Date   HGBA1C 8.3 (H) 06/17/2023   Uncontrolled, with obesity, pt to continue current medical treatment glipizide xl 10 every day, actos 45 every day, and increased mounjaro 10 mg for further wt  loss   Hypertension BP Readings from Last 3 Encounters:  06/17/23 122/76  01/25/23 118/79  01/20/23 128/76   Stable, pt to continue medical treatment coreg 6.25 bid, hxt 12.5 every day, losartan 25 qd   Hyperlipidemia Lab Results  Component Value Date   LDLCALC 141 (H) 06/17/2023   uncontrolled, pt to restart crestor 40 qd   Blurred vision, bilateral For repeat optho referral as pt c/o persistnet symptoms and current referral appt is in mar 2025, hopeful to have a new one soonner  Upper abdominal pain Etiology unclear, for acute abd films, labs including cbc and ua, and add linzess 145 mg prn constipatoin  Followup: Return in about 4 months (around 10/15/2023).  Oliver Barre, MD 06/17/2023 5:03 PM [facility]

## 2023-06-30 ENCOUNTER — Other Ambulatory Visit (HOSPITAL_COMMUNITY): Payer: Self-pay

## 2023-07-15 ENCOUNTER — Ambulatory Visit
Admission: RE | Admit: 2023-07-15 | Discharge: 2023-07-15 | Disposition: A | Discharge status: Home / Self Care | Payer: Medicare PPO | Source: Ambulatory Visit | Attending: Hematology and Oncology | Admitting: Hematology and Oncology

## 2023-07-15 DIAGNOSIS — C50411 Malignant neoplasm of upper-outer quadrant of right female breast: Secondary | ICD-10-CM

## 2023-07-15 DIAGNOSIS — Z1231 Encounter for screening mammogram for malignant neoplasm of breast: Secondary | ICD-10-CM | POA: Diagnosis not present

## 2023-08-01 ENCOUNTER — Other Ambulatory Visit: Payer: Self-pay | Admitting: Internal Medicine

## 2023-08-02 ENCOUNTER — Other Ambulatory Visit (HOSPITAL_COMMUNITY): Payer: Self-pay

## 2023-08-02 ENCOUNTER — Telehealth: Payer: Self-pay

## 2023-08-02 NOTE — Telephone Encounter (Signed)
 Pharmacy Patient Advocate Encounter  Received notification from [employer] that Prior Authorization for Zolpidem  Tartrate 10MG  tablets  has been APPROVED from 08/02/2023 to 07/24/2024. Ran test claim, Copay is $6.52. This test claim was processed through [facility]- copay amounts may vary at other pharmacies due to pharmacy/plan contracts, or as the patient moves through the different stages of their insurance plan.   PA #/Case ID/Reference #: [id number]

## 2023-08-03 ENCOUNTER — Other Ambulatory Visit (HOSPITAL_COMMUNITY): Payer: Self-pay

## 2023-08-05 ENCOUNTER — Other Ambulatory Visit: Payer: Self-pay | Admitting: Internal Medicine

## 2023-08-05 NOTE — Telephone Encounter (Signed)
 Copied from CRM [id number]. Topic: Clinical - Medication Refill >> Aug 05, 2023 11:48 AM Evie NOVAK wrote: Most Recent Primary Care Visit:  Provider: NORLEEN LYNWOOD [facility]N  Department: [facility]  Visit Type: SAME DAY  Date: 06/17/2023  Medication: zolpidem  (AMBIEN ) 10 MG tablet   Has the patient contacted their pharmacy? No (Agent: If no, request that the patient contact the pharmacy for the refill. If patient does not wish to contact the pharmacy document the reason why and proceed with request.) (Agent: If yes, when and what did the pharmacy advise?)  Is this the correct pharmacy for this prescription? Yes If no, delete pharmacy and type the correct one.  This is the patient's preferred pharmacy:    [facility] [address] ([address]), [place] - [address] [place] ([place]) [place] [address] Phone: [phone] Fax: [phone]   Has the prescription been filled recently? Yes  Is the patient out of the medication? Yes  Has the patient been seen for an appointment in the last year OR does the patient have an upcoming appointment? Yes  Can we respond through MyChart? Yes  Agent: Please be advised that Rx refills may take up to 3 business days. We ask that you follow-up with your pharmacy.

## 2023-08-17 DIAGNOSIS — B20 Human immunodeficiency virus [HIV] disease: Secondary | ICD-10-CM | POA: Diagnosis not present

## 2023-08-24 ENCOUNTER — Telehealth: Payer: Self-pay

## 2023-08-24 NOTE — Progress Notes (Signed)
Care Guide Pharmacy Note  08/24/2023 Name: Kelly Rowe MRN: [id number] DOB: 03/03/1971  Referred By: Corwin Levins, MD Reason for referral: Care Coordination ([facility] Diabetes. )   Kelly Rowe is a 53 y.o. year old female who is a primary care patient of Corwin Levins, MD.  Kelly Rowe was referred to the pharmacist for assistance related to: DMII  Successful contact was made with the patient to discuss pharmacy services including being ready for the pharmacist to call at least 5 minutes before the scheduled appointment time and to have medication bottles and any blood pressure readings ready for review. The patient agreed to meet with the pharmacist via telephone visit on (date/time). 09/12/23 at 10:00 a.m.   [facility]  [facility], [facility] Care Management Assistant Direct Dial: [phone]  Fax: [phone]

## 2023-09-08 ENCOUNTER — Telehealth: Payer: Self-pay

## 2023-09-08 ENCOUNTER — Encounter: Payer: Self-pay | Admitting: Nurse Practitioner

## 2023-09-08 ENCOUNTER — Other Ambulatory Visit: Payer: Self-pay

## 2023-09-08 ENCOUNTER — Ambulatory Visit (INDEPENDENT_AMBULATORY_CARE_PROVIDER_SITE_OTHER): Payer: Medicare Other | Admitting: Nurse Practitioner

## 2023-09-08 ENCOUNTER — Other Ambulatory Visit (HOSPITAL_COMMUNITY): Payer: Self-pay

## 2023-09-08 VITALS — BP 114/76 | Ht 61.0 in | Wt 239.6 lb

## 2023-09-08 DIAGNOSIS — Z9189 Other specified personal risk factors, not elsewhere classified: Secondary | ICD-10-CM

## 2023-09-08 DIAGNOSIS — A6000 Herpesviral infection of urogenital system, unspecified: Secondary | ICD-10-CM

## 2023-09-08 DIAGNOSIS — A549 Gonococcal infection, unspecified: Secondary | ICD-10-CM

## 2023-09-08 DIAGNOSIS — C50911 Malignant neoplasm of unspecified site of right female breast: Secondary | ICD-10-CM

## 2023-09-08 DIAGNOSIS — Z170 Estrogen receptor positive status [ER+]: Secondary | ICD-10-CM

## 2023-09-08 DIAGNOSIS — N951 Menopausal and female climacteric states: Secondary | ICD-10-CM | POA: Diagnosis not present

## 2023-09-08 DIAGNOSIS — Z21 Asymptomatic human immunodeficiency virus [HIV] infection status: Secondary | ICD-10-CM | POA: Diagnosis not present

## 2023-09-08 DIAGNOSIS — Z853 Personal history of malignant neoplasm of breast: Secondary | ICD-10-CM

## 2023-09-08 DIAGNOSIS — Z01419 Encounter for gynecological examination (general) (routine) without abnormal findings: Secondary | ICD-10-CM

## 2023-09-08 DIAGNOSIS — Z780 Asymptomatic menopausal state: Secondary | ICD-10-CM

## 2023-09-08 MED ORDER — VEOZAH 45 MG PO TABS: 1.0000 | ORAL_TABLET | Freq: Every day | ORAL | 2 refills | Status: AC

## 2023-09-08 NOTE — Telephone Encounter (Signed)
Pt called with two concerns. The first being that she would like a letter stating that systemic and radiation therapy are the cause of her tooth decay and the reason she needs oral surgery. She states the oral surgeon office recommended this letter d/t the pt not being approved for sx because the surgeon is out of network. Per Dr Al Pimple, we can not guarantee her tooth decay and need for oral surgery is d/t her hx of chemo almost 5 years ago, and Tamoxifen does not cause tooth decay. Pt was encouraged to call her insurance company and ask for a list of oral surgeons in network.  She is also asking for a letter for disability d/t lymphedema. Pt has not been to PT for lymphedema since 10/08/2020. If she is experiencing new sx we should enter new referral to be reassessed, per MD. Order entered and pt advised. She verbalized thanks and understanding.

## 2023-09-08 NOTE — Telephone Encounter (Signed)
Pharmacy Patient Advocate Encounter   Received notification from CoverMyMeds that prior authorization for Mounjaro 10MG /0.5ML auto-injectors is required/requested.   Insurance verification completed.   The patient is insured through [facility] .   Per test claim: PA required; PA submitted to above mentioned insurance via CoverMyMeds Key/confirmation #/EOC (Key: [id number])   Status is pending

## 2023-09-08 NOTE — Progress Notes (Signed)
Montgomery Rothlisberger [birth year] [id number]   History:  53 y.o. [id number] presents for annual exam. S/P 2011 TAH for fibroids. Complains of significant hot flashes and night sweats. They are affecting her life. Normal pap history. 2019 right triple positive breast cancer managed with lumpectomy, chemo, radiation, and antiestrogen therapy (Tamoxifen 05/2019). History of HIV (managed at [facility], undetectable viral load), HTN, HLD, T2DM managed by PCP.   Gynecologic History Patient's last menstrual period was 06/21/2010.   Contraception/Family planning: post menopausal status Sexually active: No  Health Maintenance Last Pap: 08/17/2022. Results were: Normal Last mammogram: 07/15/2023. Results were: Normal Last colonoscopy: 06/14/2019. Results were: Benign polyp, 5-year recall Last Dexa: Not indicated  Past medical history, past surgical history, family history and social history were all reviewed and documented in the EPIC chart. Divorced. 5 children ages 7-38. 4 grandchildren. 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   ROS:  A ROS was performed and pertinent positives and negatives are included.  Exam:  Vitals:   09/08/23 1138  BP: 114/76  Weight: 239 lb 9.6 oz (108.7 kg)  Height: 5\' 1"  (1.549 m)     Body mass index is 45.27 kg/m.  General appearance:  Normal Thyroid:  Symmetrical, normal in size, without palpable masses or nodularity. Respiratory  Auscultation:  Clear without wheezing or rhonchi Cardiovascular  Auscultation:  Regular rate, without rubs, murmurs or gallops  Edema/varicosities:  Not grossly evident Abdominal  Soft,nontender, without masses, guarding or rebound.  Liver/spleen:  No organomegaly noted  Hernia:  None appreciated  Skin  Inspection:  Grossly normal. Some redness and odor at navel, no discharge Breasts: Examined lying and sitting.   Right: Without masses, retractions, nipple discharge or axillary adenopathy. Mild lymphedema  present.  Left: Without masses, retractions, nipple discharge or axillary adenopathy. Pelvic: External genitalia:  no lesions              Urethra:  normal appearing urethra with no masses, tenderness or lesions              Bartholins and Skenes: normal                 Vagina: normal appearing vagina with normal color and discharge, no lesions              Cervix: absent Bimanual Exam:  Uterus:  absent              Adnexa: no mass, fullness, tenderness              Rectovaginal: Deferred              Anus:  normal, no lesions  Patient informed chaperone available to be present for breast and pelvic exam. Patient has requested no chaperone to be present. Patient has been advised what will be completed during breast and pelvic exam.   Assessment/Plan:  53 y.o. [id number] for annual exam.   Well female exam with routine gynecological exam - Education provided on SBEs, importance of preventative screenings, current guidelines, high calcium diet, regular exercise, and multivitamin daily.  Labs with PCP.   History of right breast cancer - 2019 right triple positive breast cancer managed with lumpectomy, chemo, radiation, and antiestrogen therapy (Tamoxifen 05/2019). Being followed by oncology. Continue annual screenings.   Postmenopausal - no HRT  Vasomotor symptoms due to menopause - Plan: Fezolinetant (VEOZAH) 45 MG TABS. Normal LFTs 08/20/2023. Aware will need to check LFTs every 3 months for first year while taking Veozah. Savings card  provided to patient.   Screening for cervical cancer - Normal Pap history. Pap every 3 years d/t HIV status.   Screening for colon cancer - 2020 colonoscopy. Will repeat at GI's recommended interval.   Return in about 1 year (around 09/07/2024) for Annual.    Olivia Mackie DNP, 12:36 PM 09/08/2023

## 2023-09-09 ENCOUNTER — Other Ambulatory Visit (HOSPITAL_COMMUNITY): Payer: Self-pay

## 2023-09-09 NOTE — Telephone Encounter (Signed)
Pharmacy Patient Advocate Encounter  Received notification from [facility] that Prior Authorization for [facility] 10MG /0.5ML auto-injectors has been APPROVED from 2.13.25 to 2.12.28. Ran test claim, Copay is $25.00. This test claim was processed through [facility]- copay amounts may vary at other pharmacies due to pharmacy/plan contracts, or as the patient moves through the different stages of their insurance plan.   PA #/Case ID/Reference #: (Key: [id number])

## 2023-09-12 ENCOUNTER — Other Ambulatory Visit: Payer: Medicare PPO

## 2023-09-12 ENCOUNTER — Telehealth: Payer: Self-pay | Admitting: Pharmacist

## 2023-09-12 NOTE — Telephone Encounter (Signed)
Called patient x3 for scheduled clinical pharmacist phone visit for [employer]. Left message with direct call back number.   Arbutus Leas, PharmD, BCPS, CPP Clinical Pharmacist Practitioner [facility] [phone]

## 2023-09-14 ENCOUNTER — Telehealth: Payer: Self-pay

## 2023-09-14 NOTE — Telephone Encounter (Signed)
Pt LVM for question regarding Veozah prescription. Advised pt that it is mail service and will take some time to arrive. Advised pt on phone number to check with pharmacy on time frame

## 2023-09-20 ENCOUNTER — Ambulatory Visit: Payer: Medicare Other | Attending: Hematology and Oncology | Admitting: Physical Therapy

## 2023-09-20 ENCOUNTER — Other Ambulatory Visit: Payer: Self-pay

## 2023-09-20 ENCOUNTER — Encounter: Payer: Self-pay | Admitting: Physical Therapy

## 2023-09-20 DIAGNOSIS — C773 Secondary and unspecified malignant neoplasm of axilla and upper limb lymph nodes: Secondary | ICD-10-CM | POA: Insufficient documentation

## 2023-09-20 DIAGNOSIS — G8929 Other chronic pain: Secondary | ICD-10-CM | POA: Insufficient documentation

## 2023-09-20 DIAGNOSIS — C50911 Malignant neoplasm of unspecified site of right female breast: Secondary | ICD-10-CM | POA: Insufficient documentation

## 2023-09-20 DIAGNOSIS — M6281 Muscle weakness (generalized): Secondary | ICD-10-CM | POA: Diagnosis not present

## 2023-09-20 DIAGNOSIS — C50411 Malignant neoplasm of upper-outer quadrant of right female breast: Secondary | ICD-10-CM | POA: Insufficient documentation

## 2023-09-20 DIAGNOSIS — M25511 Pain in right shoulder: Secondary | ICD-10-CM | POA: Insufficient documentation

## 2023-09-20 DIAGNOSIS — R293 Abnormal posture: Secondary | ICD-10-CM | POA: Insufficient documentation

## 2023-09-20 DIAGNOSIS — I890 Lymphedema, not elsewhere classified: Secondary | ICD-10-CM | POA: Insufficient documentation

## 2023-09-20 DIAGNOSIS — M25611 Stiffness of right shoulder, not elsewhere classified: Secondary | ICD-10-CM | POA: Diagnosis not present

## 2023-09-20 DIAGNOSIS — Z170 Estrogen receptor positive status [ER+]: Secondary | ICD-10-CM | POA: Diagnosis not present

## 2023-09-20 NOTE — Therapy (Signed)
 OUTPATIENT PHYSICAL THERAPY  UPPER EXTREMITY ONCOLOGY EVALUATION  Patient Name: Kelly Rowe MRN: [id number] DOB:09-21-1970, 53 y.o., female Today's Date: 09/20/2023  END OF SESSION:  PT End of Session - 09/20/23 1049     Visit Number 1    Number of Visits 9    Date for PT Re-Evaluation 10/18/23    Authorization Type AUTH required    PT Start Time 1004    PT Stop Time 1049    PT Time Calculation (min) 45 min    Activity Tolerance Patient tolerated treatment well    Behavior During Therapy [facility] for tasks assessed/performed             Past Medical History:  Diagnosis Date   ANEMIA-IRON DEFICIENCY 01/30/2010   ANXIETY 11/27/2007   Arthritis    Asthma 02/25/2011   Cancer (HCC)    breast   Carbuncle and furuncle of trunk 04/16/2010   Chlamydia infection 03/21/2008   DIABETES MELLITUS, TYPE II 08/02/2007   Edema 01/30/2010   ELEVATED BLOOD PRESSURE WITHOUT DIAGNOSIS OF HYPERTENSION 08/02/2007   Family history of breast cancer    Family history of colon cancer    Family history of lung cancer    FREQUENCY, URINARY 05/19/2009   GENITAL HERPES 03/12/2009   GERD (gastroesophageal reflux disease)    History of kidney stones    History of radiation therapy 07/27/18- 09/06/18   Right Breast, Right Axillary and Supraclavicular nodes 50 Gy in 25 fractions, Right Breast Boost 10 Gy in 5 fractions   HIV (human immunodeficiency virus infection) (HCC) 2009   HIV INFECTION 03/12/2009   HSV (herpes simplex virus) infection    HYPERLIPIDEMIA 08/02/2007   Hypertension    Metrorrhagia 03/21/2008   PONV (postoperative nausea and vomiting)    woke up crying per patient    RETENTION, URINE 05/19/2009   Sleep apnea    Trichomonas infection 01/19/2010   VITAMIN D DEFICIENCY 01/30/2010   Past Surgical History:  Procedure Laterality Date   ABDOMINAL HYSTERECTOMY  07/22/2010   TAH WITH PRESERVATION OF BOTH TUBES AND OVARIES   BREAST LUMPECTOMY Right 2019   BREAST LUMPECTOMY WITH RADIOACTIVE SEED  AND SENTINEL LYMPH NODE BIOPSY Right 02/23/2018   Procedure: BREAST LUMPECTOMY WITH RADIOACTIVE SEED AND SENTINEL LYMPH NODE BIOPSY;  Surgeon: Almond Lint, MD;  Location: [facility];  Service: General;  Laterality: Right;   CHOLECYSTECTOMY     ENDOMETRIAL ABLATION  01/11/2008   HER OPTION   ESOPHAGOGASTRODUODENOSCOPY     MULTIPLE TOOTH EXTRACTIONS     PORT-A-CATH REMOVAL Left 05/08/2019   Procedure: REMOVAL PORT-A-CATH;  Surgeon: Almond Lint, MD;  Location: [facility];  Service: General;  Laterality: Left;   PORTACATH PLACEMENT Left 02/23/2018   Procedure: INSERTION PORT-A-CATH;  Surgeon: Almond Lint, MD;  Location: [facility] OR;  Service: General;  Laterality: Left;   PORTACATH PLACEMENT N/A 04/21/2018   Procedure: PORT-A-CATH REVISION;  Surgeon: Almond Lint, MD;  Location: [facility];  Service: General;  Laterality: N/A;   TUBAL LIGATION     WISDOM TOOTH EXTRACTION     Patient Active Problem List   Diagnosis Date Noted   Blurred vision, bilateral 06/17/2023   Upper abdominal pain 06/17/2023   Travel advice encounter 11/07/2021   Bilateral knee pain 10/18/2020   Leukopenia 08/19/2020   Osteoarthritis of left knee 11/29/2019   Lymphedema of right arm 10/11/2019   COVID-19 virus infection 07/02/2019   Type 2 diabetes mellitus with other specified complication (HCC) 12/13/2018   Hematochezia  03/30/2018   Insomnia 03/30/2018   Port-A-Cath in place 03/29/2018   Genetic testing 01/30/2018   Neoplasm of breast, regional lymph node staging category N3b: metastasis in ipsilateral internal mammary lymph node and axillary lymph node (HCC) 01/30/2018   Family history of breast cancer    Family history of colon cancer    Family history of lung cancer    Morbid obesity (HCC) 01/18/2018   Malignant neoplasm of upper-outer quadrant of right breast in female, 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   Achilles tendinitis 11/28/2017   Hot flashes 09/30/2017   Hypertension 09/08/2017    Contusion of left knee and lower leg 11/18/2015   Mild intermittent asthma 08/15/2013   BV (bacterial vaginosis) 08/11/2012   Right knee pain 07/05/2012   GERD (gastroesophageal reflux disease) 07/05/2012   GC (gonococcus) 05/30/2012   Chlamydia 05/30/2012   Left knee pain 01/12/2012   Encounter for long-term (current) use of high-risk medication 02/25/2011   Encounter for well adult exam with abnormal findings 02/14/2011   Vitamin D deficiency 01/30/2010   ANEMIA-IRON DEFICIENCY 01/30/2010   Edema 01/30/2010   HIV (human immunodeficiency virus infection) (HCC) 03/12/2009   GENITAL HERPES 03/12/2009   Depression with anxiety 11/27/2007   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, without long-term current use of insulin (HCC) 08/02/2007   Hyperlipidemia 08/02/2007    PCP: Oliver Barre, MD  REFERRING PROVIDER: Rachel Moulds, MD  REFERRING DIAG: C50.411,Z17.0 (ICD-10-CM) - Malignant neoplasm of upper-outer quadrant of right breast in female, estrogen receptor positive (HCC) C50.911,C77.3 (ICD-10-CM) - Neoplasm of right breast, regional lymph node staging category N3b: metastasis in ipsilateral internal mammary lymph node and axillary lymph node (HCC)  THERAPY DIAG:  Lymphedema, not elsewhere classified  Stiffness of right shoulder, not elsewhere classified  Chronic right shoulder pain  Muscle weakness (generalized)  Abnormal posture  Malignant neoplasm of upper-outer quadrant of right breast in female, estrogen receptor positive (HCC)  ONSET DATE: 08/25/23  Rationale for Evaluation and Treatment: Rehabilitation  SUBJECTIVE:                                                                                                                                                                                           SUBJECTIVE STATEMENT: I have the FlexiTouch. I use it 3x/wk. 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 So I started back working Jan 13th. I drive four hours. I am doing all my driving with the R hand. I have a sleeve and I wear it sometimes. I have to pull the  night time garment off at night because of the hot flashes.   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 Chemotherapy completed 07/05/18, Herceptin started with chemo and continues through August of this year. Radiation 07/27/18-09/06/18 to the Rt breast, taking tamoxifen,  Other medical history to include asthma, DM, HTN, HIV, and hysterectomy  PAIN:  Are you having pain? Yes NPRS scale: 8/10 Pain location: upper arm and breast Pain orientation: Right  PAIN TYPE: sharp Pain description: intermittent  Aggravating factors: nothing Relieving factors: nothing - thinks it was better before working  PRECAUTIONS:  DM, HTN, HIV  RED FLAGS: None   WEIGHT BEARING RESTRICTIONS: No  FALLS:  Has patient fallen in last 6 months? 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  COGNITION: Overall cognitive status: Within functional limits for tasks assessed   PALPATION: Increased scar tissue in R axilla at scar line and in R lateral trunk, increased fibrosis to inner medial breast  OBSERVATIONS / OTHER ASSESSMENTS: R breast with increased pore size  POSTURE: forward head and rounded shoulders  UPPER EXTREMITY AROM/PROM:  A/PROM RIGHT   eval   Shoulder extension 60  Shoulder flexion 150   Shoulder abduction 131  Shoulder internal rotation 40  Shoulder external rotation 80    (Blank rows = not tested)  A/PROM LEFT   eval  Shoulder extension 56  Shoulder flexion 177  Shoulder abduction 170  Shoulder internal rotation 51  Shoulder external rotation 90    (Blank rows = not tested)   GRIP STRENGTH: R: 36 lbs, 26 lbs, 16 lbs - AVG: 26 lbs - Norm is 65 lbs           L:  21 lbs, 25 lbs, 22 lbs - AVG: 22 lbs - Norm is 57 lbs   LYMPHEDEMA ASSESSMENTS:   LANDMARK RIGHT  eval  At axilla  42  15 cm proximal to olecranon process 40.5  10 cm proximal to olecranon process 35.5  Olecranon process 27.5  15 cm proximal to ulnar styloid process 28.5  10 cm proximal to ulnar styloid process 24.2  Just proximal to ulnar styloid process 17  Across hand at thumb web space 20.2  At base of 2nd digit 6.1  (Blank rows = not tested)  LANDMARK LEFT  eval  At axilla  43  15 cm proximal to olecranon process 39.9  10 cm proximal to olecranon process 35  Olecranon process 27.5  15 cm proximal to ulnar styloid process 28.6  10 cm proximal to ulnar styloid process 23.6  Just proximal to ulnar styloid process 17.5  Across hand at thumb web space 19.1  At base of 2nd digit 6  (Blank rows = not tested)   LLIS: Flowsheet Row Outpatient Rehab from 09/20/2023 in [facility]  Lymphedema Life Impact Scale Total Score 64.71 %         QUICK DASH SURVEY:   Neldon Mc - 09/20/23 0001     Open a tight  or new jar Severe difficulty    Do heavy household chores (wash walls, wash floors) Moderate difficulty    Carry a shopping bag or briefcase Unable    Wash your back Moderate difficulty    Use a knife to cut food Moderate difficulty    Recreational activities in which you take some force or impact through your arm, shoulder, or hand (golf, hammering, tennis) Severe difficulty    During the past week, to what extent has your arm, shoulder or hand problem  interfered with your normal social activities with family, friends, neighbors, or groups? Quite a bit    During the past week, to what extent has your arm, shoulder or hand problem limited your work or other regular daily activities Quite a bit    Arm, shoulder, or hand pain. Severe    Tingling (pins and needles) in your arm, shoulder, or hand Moderate    Difficulty Sleeping So much difficuSo much difficulty, I can't sleep    DASH Score 70.45 %                                                                                                                                        TREATMENT DATE:  09/20/23: None today due to time constraints   PATIENT EDUCATION:  Education details: lymphedema and need for compression bra, how muscle tightness and scar tissue can compression nerves and cause the zinging pain she is experiencing  Person educated: Patient Education method: Explanation Education comprehension: verbalized understanding  HOME EXERCISE PROGRAM: Try compression bra at night  ASSESSMENT:  CLINICAL IMPRESSION: Patient is a 53 y.o. female who was seen today for physical therapy evaluation and treatment for R shoulder pain and stiffness, breast lymphedema, weakness of R UE and grip strength and abnormal posture. She reports the pain has worsened since she started working. She drives a bus for work. She has R breast lymphedema. Her arm circumferential measurements did not demonstrate lymphedema currently but she does use a compression pump 3x/wk because she has had swelling in her RUE in the past. Her bilateral grip strength is substantially less than normative data for her age. She would benefit from skilled PT services to improve R shoulder ROM, decrease R shoulder pain, decrease pec tightness, decrease scar tissue, improve grip strength and to improve posture.     OBJECTIVE IMPAIRMENTS: decreased knowledge of condition, decreased ROM, decreased strength, increased edema, increased  fascial restrictions, increased muscle spasms, impaired UE functional use, postural dysfunction, and pain.   ACTIVITY LIMITATIONS: carrying, lifting, and reach over head  PARTICIPATION LIMITATIONS: meal prep, cleaning, laundry, driving, shopping, community activity, and occupation  PERSONAL FACTORS: Fitness and Time since onset of injury/illness/exacerbation are also affecting patient's functional outcome.   REHAB POTENTIAL: Good  CLINICAL DECISION MAKING: Stable/uncomplicated  EVALUATION COMPLEXITY: Moderate  GOALS: Goals reviewed with patient? Yes  SHORT TERM GOALS=LONG TERM GOALS Target date: 10/18/23  Pt will demonstrate 170 degrees of R shoulder flexion to allow her to reach overhead. Baseline: Goal status: INITIAL  2.  Pt will demonstrate 160 degrees of R shoulder abduction to allow her to reach out to the side.  Baseline:  Goal status: INITIAL  3.  Pt will report a 50% improvement in pain in R chest, shoulder and upper arm to allow improved comfort. Baseline:  Goal status: INITIAL  4.  Pt will improve grip strength to 50 lbs on R to allow improved function and increase ability to open jars independently.  Baseline:  Goal status: INITIAL  5.  Pt will be independent in self MLD of R breast for long term management.  Baseline:  Goal status: INITIAL  6.  Pt will be independent in a home exercise program for continued stretching and strengthening.  Baseline:  Goal status: INITIAL   PLAN:  PT FREQUENCY: 2x/week  PT DURATION: 4 weeks  PLANNED INTERVENTIONS: [id number]- PT Re-evaluation, 97110-Therapeutic exercises, 97530- Therapeutic activity, 97112- Neuromuscular re-education, 97535- Self Care, [id number]- Manual therapy, [phone]- Orthotic Fit/training, Patient/Family education, Balance training, Joint mobilization, Manual lymph drainage, Scar mobilization, Therapeutic exercises, Therapeutic activity, Neuromuscular re-education, Gait training, and Self Care  PLAN FOR NEXT  SESSION: begin STM to R pec and lateral trunk, MFR to scar tissue, eventually grip strength and R UE strength, posture exercises, pec stretches, MLD to R breast and instruct pt   [employer], PT 09/20/2023, 1:11 PM

## 2023-09-27 ENCOUNTER — Ambulatory Visit: Payer: Medicare Other | Admitting: Rehabilitation

## 2023-09-29 ENCOUNTER — Telehealth: Payer: Self-pay

## 2023-09-29 ENCOUNTER — Encounter: Payer: Self-pay | Admitting: Rehabilitation

## 2023-09-29 ENCOUNTER — Ambulatory Visit: Payer: Medicare Other | Attending: Hematology and Oncology | Admitting: Rehabilitation

## 2023-09-29 DIAGNOSIS — Z170 Estrogen receptor positive status [ER+]: Secondary | ICD-10-CM | POA: Diagnosis not present

## 2023-09-29 DIAGNOSIS — M25611 Stiffness of right shoulder, not elsewhere classified: Secondary | ICD-10-CM | POA: Insufficient documentation

## 2023-09-29 DIAGNOSIS — I890 Lymphedema, not elsewhere classified: Secondary | ICD-10-CM | POA: Insufficient documentation

## 2023-09-29 DIAGNOSIS — R293 Abnormal posture: Secondary | ICD-10-CM | POA: Diagnosis not present

## 2023-09-29 DIAGNOSIS — M25511 Pain in right shoulder: Secondary | ICD-10-CM | POA: Insufficient documentation

## 2023-09-29 DIAGNOSIS — C50411 Malignant neoplasm of upper-outer quadrant of right female breast: Secondary | ICD-10-CM | POA: Insufficient documentation

## 2023-09-29 DIAGNOSIS — M6281 Muscle weakness (generalized): Secondary | ICD-10-CM | POA: Diagnosis not present

## 2023-09-29 DIAGNOSIS — G8929 Other chronic pain: Secondary | ICD-10-CM | POA: Insufficient documentation

## 2023-09-29 NOTE — Telephone Encounter (Signed)
 Patient was identified as falling into the [place] Measure - Diabetes.   Patient was: Left voicemail to schedule with primary care provider. Patient is on a every 6 month schedule.

## 2023-09-29 NOTE — Therapy (Signed)
 OUTPATIENT PHYSICAL THERAPY  UPPER EXTREMITY ONCOLOGY   Patient Name: Kelly Rowe MRN: [id number] DOB:09-17-1970, 53 y.o., female Today's Date: 09/29/2023  END OF SESSION:  PT End of Session - 09/29/23 1601     Visit Number 2    Number of Visits 9    Date for PT Re-Evaluation 10/18/23    Authorization Type 8 visits 09/20/23-10/18/23    Authorization - Visit Number 2    Authorization - Number of Visits 8    PT Start Time 1115    PT Stop Time 1158    PT Time Calculation (min) 43 min    Activity Tolerance Patient tolerated treatment well    Behavior During Therapy [facility] for tasks assessed/performed              Past Medical History:  Diagnosis Date   ANEMIA-IRON DEFICIENCY 01/30/2010   ANXIETY 11/27/2007   Arthritis    Asthma 02/25/2011   Cancer (HCC)    breast   Carbuncle and furuncle of trunk 04/16/2010   Chlamydia infection 03/21/2008   DIABETES MELLITUS, TYPE II 08/02/2007   Edema 01/30/2010   ELEVATED BLOOD PRESSURE WITHOUT DIAGNOSIS OF HYPERTENSION 08/02/2007   Family history of breast cancer    Family history of colon cancer    Family history of lung cancer    FREQUENCY, URINARY 05/19/2009   GENITAL HERPES 03/12/2009   GERD (gastroesophageal reflux disease)    History of kidney stones    History of radiation therapy 07/27/18- 09/06/18   Right Breast, Right Axillary and Supraclavicular nodes 50 Gy in 25 fractions, Right Breast Boost 10 Gy in 5 fractions   HIV (human immunodeficiency virus infection) (HCC) 2009   HIV INFECTION 03/12/2009   HSV (herpes simplex virus) infection    HYPERLIPIDEMIA 08/02/2007   Hypertension    Metrorrhagia 03/21/2008   PONV (postoperative nausea and vomiting)    woke up crying per patient    RETENTION, URINE 05/19/2009   Sleep apnea    Trichomonas infection 01/19/2010   VITAMIN D DEFICIENCY 01/30/2010   Past Surgical History:  Procedure Laterality Date   ABDOMINAL HYSTERECTOMY  07/22/2010   TAH WITH PRESERVATION OF BOTH TUBES AND OVARIES    BREAST LUMPECTOMY Right 2019   BREAST LUMPECTOMY WITH RADIOACTIVE SEED AND SENTINEL LYMPH NODE BIOPSY Right 02/23/2018   Procedure: BREAST LUMPECTOMY WITH RADIOACTIVE SEED AND SENTINEL LYMPH NODE BIOPSY;  Surgeon: Almond Lint, MD;  Location: [facility] OR;  Service: General;  Laterality: Right;   CHOLECYSTECTOMY     ENDOMETRIAL ABLATION  01/11/2008   HER OPTION   ESOPHAGOGASTRODUODENOSCOPY     MULTIPLE TOOTH EXTRACTIONS     PORT-A-CATH REMOVAL Left 05/08/2019   Procedure: REMOVAL PORT-A-CATH;  Surgeon: Almond Lint, MD;  Location: [facility];  Service: General;  Laterality: Left;   PORTACATH PLACEMENT Left 02/23/2018   Procedure: INSERTION PORT-A-CATH;  Surgeon: Almond Lint, MD;  Location: [facility] OR;  Service: General;  Laterality: Left;   PORTACATH PLACEMENT N/A 04/21/2018   Procedure: PORT-A-CATH REVISION;  Surgeon: Almond Lint, MD;  Location: [facility];  Service: General;  Laterality: N/A;   TUBAL LIGATION     WISDOM TOOTH EXTRACTION     Patient Active Problem List   Diagnosis Date Noted   Blurred vision, bilateral 06/17/2023   Upper abdominal pain 06/17/2023   Travel advice encounter 11/07/2021   Bilateral knee pain 10/18/2020   Leukopenia 08/19/2020   Osteoarthritis of left knee 11/29/2019   Lymphedema of right arm 10/11/2019  COVID-19 virus infection 07/02/2019   Type 2 diabetes mellitus with other specified complication (HCC) 12/13/2018   Hematochezia 03/30/2018   Insomnia 03/30/2018   Port-A-Cath in place 03/29/2018   Genetic testing 01/30/2018   Neoplasm of breast, regional lymph node staging category N3b: metastasis in ipsilateral internal mammary lymph node and axillary lymph node (HCC) 01/30/2018   Family history of breast cancer    Family history of colon cancer    Family history of lung cancer    Morbid obesity (HCC) 01/18/2018   Malignant neoplasm of upper-outer quadrant of right breast in female, 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   Achilles  tendinitis 11/28/2017   Hot flashes 09/30/2017   Hypertension 09/08/2017   Contusion of left knee and lower leg 11/18/2015   Mild intermittent asthma 08/15/2013   BV (bacterial vaginosis) 08/11/2012   Right knee pain 07/05/2012   GERD (gastroesophageal reflux disease) 07/05/2012   GC (gonococcus) 05/30/2012   Chlamydia 05/30/2012   Left knee pain 01/12/2012   Encounter for long-term (current) use of high-risk medication 02/25/2011   Encounter for well adult exam with abnormal findings 02/14/2011   Vitamin D deficiency 01/30/2010   ANEMIA-IRON DEFICIENCY 01/30/2010   Edema 01/30/2010   HIV (human immunodeficiency virus infection) (HCC) 03/12/2009   GENITAL HERPES 03/12/2009   Depression with anxiety 11/27/2007   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, without long-term current use of insulin (HCC) 08/02/2007   Hyperlipidemia 08/02/2007    PCP: Oliver Barre, MD  REFERRING PROVIDER: Rachel Moulds, MD  REFERRING DIAG: C50.411,Z17.0 (ICD-10-CM) - Malignant neoplasm of upper-outer quadrant of right breast in female, estrogen receptor positive (HCC) C50.911,C77.3 (ICD-10-CM) - Neoplasm of right breast, regional lymph node staging category N3b: metastasis in ipsilateral internal mammary lymph node and axillary lymph node (HCC)  THERAPY DIAG:  Stiffness of right shoulder, not elsewhere classified  Chronic right shoulder pain  Muscle weakness (generalized)  Lymphedema, not elsewhere classified  Abnormal posture  Malignant neoplasm of upper-outer quadrant of right breast in female, estrogen receptor positive (HCC)  ONSET DATE: 08/25/23  Rationale for Evaluation and Treatment: Rehabilitation  SUBJECTIVE:                                                                                                                                                                                           SUBJECTIVE STATEMENT:  The  breast is just sore.  It isn't hurting   EVAL: I have the FlexiTouch. I use it 3x/wk. 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 So I started  back working Jan 13th. I drive four hours. I am doing all my driving with the R hand. I have a sleeve and I wear it sometimes. I have to pull the night time garment off at night because of the hot flashes.   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 Chemotherapy completed 07/05/18, Herceptin started with chemo and continues through August of this year. Radiation 07/27/18-09/06/18 to the Rt breast, taking tamoxifen,  Other medical history to include asthma, DM, HTN, HIV, and hysterectomy  PAIN:  Are you having pain?  NPRS scale: 0/10 at the worst 8/10  Pain location: shoulder and breast Pain orientation: Right  PAIN TYPE: sharp Pain description: intermittent  Aggravating factors: nothing Relieving factors: nothing - thinks it was better before working  PRECAUTIONS:  DM, HTN, HIV  RED FLAGS: None   WEIGHT BEARING RESTRICTIONS: No  FALLS:  Has patient fallen in last 6 months? 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  COGNITION: Overall cognitive status: Within functional limits for tasks assessed   PALPATION: Increased scar tissue in R axilla at scar line and in R lateral trunk, increased fibrosis to inner medial breast  OBSERVATIONS / OTHER ASSESSMENTS: R breast with increased pore  size  POSTURE: forward head and rounded shoulders  UPPER EXTREMITY AROM/PROM:  A/PROM RIGHT   eval   Shoulder extension 60  Shoulder flexion 150  Shoulder abduction 131  Shoulder internal rotation 40  Shoulder external rotation 80    (Blank rows = not tested)  A/PROM LEFT   eval  Shoulder extension 56  Shoulder flexion 177  Shoulder abduction 170  Shoulder internal rotation 51  Shoulder external rotation 90    (Blank rows = not tested)   GRIP STRENGTH: R: 36 lbs, 26 lbs, 16 lbs - AVG: 26 lbs - Norm is 65 lbs           L:  21 lbs, 25 lbs, 22 lbs - AVG: 22 lbs - Norm is 57 lbs   LYMPHEDEMA ASSESSMENTS:   LANDMARK RIGHT  eval  At axilla  42  15 cm proximal to olecranon process 40.5  10 cm proximal to olecranon process 35.5  Olecranon process 27.5  15 cm proximal to ulnar styloid process 28.5  10 cm proximal to ulnar styloid process 24.2  Just proximal to ulnar styloid process 17  Across hand at thumb web space 20.2  At base of 2nd digit 6.1  (Blank rows = not tested)  LANDMARK LEFT  eval  At axilla  43  15 cm proximal to olecranon process 39.9  10 cm proximal to olecranon process 35  Olecranon process 27.5  15 cm proximal to ulnar styloid process 28.6  10 cm proximal to ulnar styloid process 23.6  Just proximal to ulnar styloid process 17.5  Across hand at thumb web space 19.1  At base of 2nd digit 6  (Blank rows = not tested)   LLIS: Flowsheet Row Outpatient Rehab from 09/20/2023 in [facility]  Specialty Rehab  Lymphedema Life Impact Scale Total Score 64.71 %                                                                                                                                TREATMENT DATE:  Pt permission and consent throughout each step of examination and treatment with modification and draping if requested when working on sensitive areas  09/29/23: In supine: Short neck, superficial and deep abdominals, 5 diaphragmatic breaths,  bil axillary nodes and establishment of interaxillary pathway, Rt inguinal nodes and establishment of axilloinguinal pathway, then Rt breast moving fluid towards pathways spending extra time in any areas of fibrosis then retracing all steps. Then into left sidelying posterior interaxillary work, then supine again for PROM of the Rt shoulder into flexion, abduction, and D2 flexion positions.   09/20/23: None today due to time constraints   PATIENT EDUCATION:  Education details: lymphedema and need for compression bra, how muscle tightness and scar tissue can compression nerves and cause the zinging pain she is experiencing  Person educated: Patient Education method: Explanation Education comprehension: verbalized understanding  HOME EXERCISE PROGRAM: Try compression bra at night  ASSESSMENT:  CLINICAL IMPRESSION: Pt tolerated treatment well.  Noted medial fibrosis and more scar tissue/mm tightness laterally.    OBJECTIVE IMPAIRMENTS: decreased knowledge of condition, decreased ROM, decreased strength, increased edema, increased fascial restrictions, increased muscle spasms, impaired UE functional use, postural dysfunction, and pain.   ACTIVITY LIMITATIONS: carrying, lifting, and reach over head  PARTICIPATION LIMITATIONS: meal prep, cleaning, laundry, driving, shopping, community activity, and occupation  PERSONAL FACTORS: Fitness and Time since onset of injury/illness/exacerbation are also affecting patient's functional outcome.   REHAB POTENTIAL: Good  CLINICAL DECISION MAKING: Stable/uncomplicated  EVALUATION COMPLEXITY: Moderate  GOALS: Goals reviewed with patient? Yes  SHORT TERM GOALS=LONG TERM GOALS Target date: 10/18/23  Pt will demonstrate 170 degrees of R shoulder flexion to allow her to reach overhead. Baseline: Goal status: INITIAL  2.  Pt will demonstrate 160 degrees of R shoulder abduction to allow her to reach out to the side.  Baseline:  Goal status:  INITIAL  3.  Pt will report a 50% improvement in pain in R chest, shoulder and upper arm to allow improved comfort. Baseline:  Goal status: INITIAL  4.  Pt will improve grip strength to 50 lbs on R to allow improved function and increase ability to open jars independently.  Baseline:  Goal status: INITIAL  5.  Pt will be independent in self MLD of R breast for long term management.  Baseline:  Goal status: INITIAL  6.  Pt will be independent in a home exercise program for continued stretching and strengthening.  Baseline:  Goal status: INITIAL   PLAN:  PT FREQUENCY: 2x/week  PT DURATION: 4 weeks  PLANNED INTERVENTIONS: [id number]- PT Re-evaluation, 97110-Therapeutic exercises, 97530- Therapeutic activity, [id number]- Neuromuscular re-education, 97535- Self Care, 96045- Manual therapy, [phone]- Orthotic Fit/training, Patient/Family education, Balance  training, Joint mobilization, Manual lymph drainage, Scar mobilization, Therapeutic exercises, Therapeutic activity, Neuromuscular re-education, Gait training, and Self Care  PLAN FOR NEXT SESSION: begin STM to R pec and lateral trunk, MFR to scar tissue, eventually grip strength and R UE strength, posture exercises, pec stretches, MLD to R breast and instruct pt   Idamae Lusher, PT 09/29/2023, 4:03 PM

## 2023-09-30 DIAGNOSIS — H0288A Meibomian gland dysfunction right eye, upper and lower eyelids: Secondary | ICD-10-CM | POA: Diagnosis not present

## 2023-09-30 DIAGNOSIS — H04123 Dry eye syndrome of bilateral lacrimal glands: Secondary | ICD-10-CM | POA: Diagnosis not present

## 2023-09-30 DIAGNOSIS — H0288B Meibomian gland dysfunction left eye, upper and lower eyelids: Secondary | ICD-10-CM | POA: Diagnosis not present

## 2023-09-30 DIAGNOSIS — E119 Type 2 diabetes mellitus without complications: Secondary | ICD-10-CM | POA: Diagnosis not present

## 2023-09-30 DIAGNOSIS — H25813 Combined forms of age-related cataract, bilateral: Secondary | ICD-10-CM | POA: Diagnosis not present

## 2023-09-30 DIAGNOSIS — H35033 Hypertensive retinopathy, bilateral: Secondary | ICD-10-CM | POA: Diagnosis not present

## 2023-09-30 LAB — HM DIABETES EYE EXAM

## 2023-10-04 ENCOUNTER — Ambulatory Visit: Payer: Medicare Other | Admitting: Rehabilitation

## 2023-10-04 ENCOUNTER — Encounter: Payer: Self-pay | Admitting: Rehabilitation

## 2023-10-04 DIAGNOSIS — M6281 Muscle weakness (generalized): Secondary | ICD-10-CM

## 2023-10-04 DIAGNOSIS — Z170 Estrogen receptor positive status [ER+]: Secondary | ICD-10-CM | POA: Diagnosis not present

## 2023-10-04 DIAGNOSIS — R293 Abnormal posture: Secondary | ICD-10-CM | POA: Diagnosis not present

## 2023-10-04 DIAGNOSIS — M25611 Stiffness of right shoulder, not elsewhere classified: Secondary | ICD-10-CM | POA: Diagnosis not present

## 2023-10-04 DIAGNOSIS — M25511 Pain in right shoulder: Secondary | ICD-10-CM | POA: Diagnosis not present

## 2023-10-04 DIAGNOSIS — G8929 Other chronic pain: Secondary | ICD-10-CM

## 2023-10-04 DIAGNOSIS — I890 Lymphedema, not elsewhere classified: Secondary | ICD-10-CM | POA: Diagnosis not present

## 2023-10-04 DIAGNOSIS — C50411 Malignant neoplasm of upper-outer quadrant of right female breast: Secondary | ICD-10-CM | POA: Diagnosis not present

## 2023-10-04 NOTE — Therapy (Signed)
 OUTPATIENT PHYSICAL THERAPY  UPPER EXTREMITY ONCOLOGY   Patient Name: Kelly Rowe MRN: [id number] DOB:[birth year], 53 y.o., female Today's Date: 10/04/2023  END OF SESSION:  PT End of Session - 10/04/23 1103     Visit Number 3    Number of Visits 9    Date for PT Re-Evaluation 10/18/23    Authorization Type 8 visits 09/20/23-10/18/23    Authorization - Visit Number 3    Authorization - Number of Visits 8    PT Start Time 1107    PT Stop Time 1152    PT Time Calculation (min) 45 min    Activity Tolerance Patient tolerated treatment well    Behavior During Therapy [facility] for tasks assessed/performed              Past Medical History:  Diagnosis Date   ANEMIA-IRON DEFICIENCY 01/30/2010   ANXIETY 11/27/2007   Arthritis    Asthma 02/25/2011   Cancer (HCC)    breast   Carbuncle and furuncle of trunk 04/16/2010   Chlamydia infection 03/21/2008   DIABETES MELLITUS, TYPE II 08/02/2007   Edema 01/30/2010   ELEVATED BLOOD PRESSURE WITHOUT DIAGNOSIS OF HYPERTENSION 08/02/2007   Family history of breast cancer    Family history of colon cancer    Family history of lung cancer    FREQUENCY, URINARY 05/19/2009   GENITAL HERPES 03/12/2009   GERD (gastroesophageal reflux disease)    History of kidney stones    History of radiation therapy 07/27/18- 09/06/18   Right Breast, Right Axillary and Supraclavicular nodes 50 Gy in 25 fractions, Right Breast Boost 10 Gy in 5 fractions   HIV (human immunodeficiency virus infection) (HCC) 2009   HIV INFECTION 03/12/2009   HSV (herpes simplex virus) infection    HYPERLIPIDEMIA 08/02/2007   Hypertension    Metrorrhagia 03/21/2008   PONV (postoperative nausea and vomiting)    woke up crying per patient    RETENTION, URINE 05/19/2009   Sleep apnea    Trichomonas infection 01/19/2010   VITAMIN D DEFICIENCY 01/30/2010   Past Surgical History:  Procedure Laterality Date   ABDOMINAL HYSTERECTOMY  07/22/2010   TAH WITH PRESERVATION OF BOTH TUBES AND  OVARIES   BREAST LUMPECTOMY Right 2019   BREAST LUMPECTOMY WITH RADIOACTIVE SEED AND SENTINEL LYMPH NODE BIOPSY Right 02/23/2018   Procedure: BREAST LUMPECTOMY WITH RADIOACTIVE SEED AND SENTINEL LYMPH NODE BIOPSY;  Surgeon: Almond Lint, MD;  Location: [facility];  Service: General;  Laterality: Right;   CHOLECYSTECTOMY     ENDOMETRIAL ABLATION  01/11/2008   HER OPTION   ESOPHAGOGASTRODUODENOSCOPY     MULTIPLE TOOTH EXTRACTIONS     PORT-A-CATH REMOVAL Left 05/08/2019   Procedure: REMOVAL PORT-A-CATH;  Surgeon: Almond Lint, MD;  Location: [facility];  Service: General;  Laterality: Left;   PORTACATH PLACEMENT Left 02/23/2018   Procedure: INSERTION PORT-A-CATH;  Surgeon: Almond Lint, MD;  Location: [facility];  Service: General;  Laterality: Left;   PORTACATH PLACEMENT N/A 04/21/2018   Procedure: PORT-A-CATH REVISION;  Surgeon: Almond Lint, MD;  Location: [facility];  Service: General;  Laterality: N/A;   TUBAL LIGATION     WISDOM TOOTH EXTRACTION     Patient Active Problem List   Diagnosis Date Noted   Blurred vision, bilateral 06/17/2023   Upper abdominal pain 06/17/2023   Travel advice encounter 11/07/2021   Bilateral knee pain 10/18/2020   Leukopenia 08/19/2020   Osteoarthritis of left knee 11/29/2019   Lymphedema of right arm 10/11/2019  COVID-19 virus infection 07/02/2019   Type 2 diabetes mellitus with other specified complication (HCC) 12/13/2018   Hematochezia 03/30/2018   Insomnia 03/30/2018   Port-A-Cath in place 03/29/2018   Genetic testing 01/30/2018   Neoplasm of breast, regional lymph node staging category N3b: metastasis in ipsilateral internal mammary lymph node and axillary lymph node (HCC) 01/30/2018   Family history of breast cancer    Family history of colon cancer    Family history of lung cancer    Morbid obesity (HCC) 01/18/2018   Malignant neoplasm of upper-outer quadrant of right breast in female, 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    Achilles tendinitis 11/28/2017   Hot flashes 09/30/2017   Hypertension 09/08/2017   Contusion of left knee and lower leg 11/18/2015   Mild intermittent asthma 08/15/2013   BV (bacterial vaginosis) 08/11/2012   Right knee pain 07/05/2012   GERD (gastroesophageal reflux disease) 07/05/2012   GC (gonococcus) 05/30/2012   Chlamydia 05/30/2012   Left knee pain 01/12/2012   Encounter for long-term (current) use of high-risk medication 02/25/2011   Encounter for well adult exam with abnormal findings 02/14/2011   Vitamin D deficiency 01/30/2010   ANEMIA-IRON DEFICIENCY 01/30/2010   Edema 01/30/2010   HIV (human immunodeficiency virus infection) (HCC) 03/12/2009   GENITAL HERPES 03/12/2009   Depression with anxiety 11/27/2007   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, without long-term current use of insulin (HCC) 08/02/2007   Hyperlipidemia 08/02/2007    PCP: Oliver Barre, MD  REFERRING PROVIDER: Rachel Moulds, MD  REFERRING DIAG: C50.411,Z17.0 (ICD-10-CM) - Malignant neoplasm of upper-outer quadrant of right breast in female, estrogen receptor positive (HCC) C50.911,C77.3 (ICD-10-CM) - Neoplasm of right breast, regional lymph node staging category N3b: metastasis in ipsilateral internal mammary lymph node and axillary lymph node (HCC)  THERAPY DIAG:  Stiffness of right shoulder, not elsewhere classified  Chronic right shoulder pain  Muscle weakness (generalized)  Lymphedema, not elsewhere classified  Abnormal posture  Malignant neoplasm of upper-outer quadrant of right breast in female, estrogen receptor positive (HCC)  ONSET DATE: 08/25/23  Rationale for Evaluation and Treatment: Rehabilitation  SUBJECTIVE:                                                                                                                                                                                           SUBJECTIVE  STATEMENT:  It feels better.    EVAL: I have the FlexiTouch. I use it 3x/wk. 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 So I started back working Jan 13th. I  drive four hours. I am doing all my driving with the R hand. I have a sleeve and I wear it sometimes. I have to pull the night time garment off at night because of the hot flashes.   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 Chemotherapy completed 07/05/18, Herceptin started with chemo and continues through August of this year. Radiation 07/27/18-09/06/18 to the Rt breast, taking tamoxifen,  Other medical history to include asthma, DM, HTN, HIV, and hysterectomy  PAIN:  Are you having pain?  NPRS scale: 0/10 at the worst 8/10  Pain location: shoulder and breast Pain orientation: Right  PAIN TYPE: sharp Pain description: intermittent  Aggravating factors: nothing Relieving factors: nothing - thinks it was better before working  PRECAUTIONS:  DM, HTN, HIV  RED FLAGS: None   WEIGHT BEARING RESTRICTIONS: No  FALLS:  Has patient fallen in last 6 months? 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  COGNITION: Overall cognitive status: Within functional limits for tasks assessed   PALPATION: Increased scar tissue in R axilla at scar line and in R lateral trunk, increased fibrosis to inner medial breast  OBSERVATIONS / OTHER ASSESSMENTS: R breast with increased pore  size  POSTURE: forward head and rounded shoulders  UPPER EXTREMITY AROM/PROM:  A/PROM RIGHT   eval   Shoulder extension 60  Shoulder flexion 150  Shoulder abduction 131  Shoulder internal rotation 40  Shoulder external rotation 80    (Blank rows = not tested)  A/PROM LEFT   eval  Shoulder extension 56  Shoulder flexion 177  Shoulder abduction 170  Shoulder internal rotation 51  Shoulder external rotation 90    (Blank rows = not tested)   GRIP STRENGTH: R: 36 lbs, 26 lbs, 16 lbs - AVG: 26 lbs - Norm is 65 lbs           L:  21 lbs, 25 lbs, 22 lbs - AVG: 22 lbs - Norm is 57 lbs   LYMPHEDEMA ASSESSMENTS:   LANDMARK RIGHT  eval  At axilla  42  15 cm proximal to olecranon process 40.5  10 cm proximal to olecranon process 35.5  Olecranon process 27.5  15 cm proximal to ulnar styloid process 28.5  10 cm proximal to ulnar styloid process 24.2  Just proximal to ulnar styloid process 17  Across hand at thumb web space 20.2  At base of 2nd digit 6.1  (Blank rows = not tested)  LANDMARK LEFT  eval  At axilla  43  15 cm proximal to olecranon process 39.9  10 cm proximal to olecranon process 35  Olecranon process 27.5  15 cm proximal to ulnar styloid process 28.6  10 cm proximal to ulnar styloid process 23.6  Just proximal to ulnar styloid process 17.5  Across hand at thumb web space 19.1  At base of 2nd digit 6  (Blank rows = not tested)   LLIS: Flowsheet Row Outpatient Rehab from 09/20/2023 in [facility]  Lymphedema Life  Impact Scale Total Score 64.71 %                                                                                                                                TREATMENT DATE:  Pt permission and consent throughout each step of examination and treatment with modification and draping if requested when working on sensitive areas  10/04/23: Self Care:  Focused on teaching / reviewing self MLD as pt has learned this  previously but has not done it much due to getting a flexitouch pump.  Discussed how pump is not great and getting underneath and around the edges so that the hand is preferable.  Reviewed in supine with HOB elevated around 45deg to mimic home set up.  Most cueing needed for reminder of steps and hand placements. Gave new Handout.  Manual Therapy: In supine: Short neck, superficial and deep abdominals, 5 diaphragmatic breaths, bil axillary nodes and establishment of interaxillary pathway, Rt inguinal nodes and establishment of axilloinguinal pathway, then Rt breast moving fluid towards pathways spending extra time in any areas of fibrosis then retracing all steps. Then into left sidelying posterior interaxillary work, then supine again for PROM of the Rt shoulder into flexion, abduction, and D2 flexion positions. Scar tissue work to the lumpectomy incision with cocoa butter working all directions  09/29/23: In supine: Short neck, superficial and deep abdominals, 5 diaphragmatic breaths, bil axillary nodes and establishment of interaxillary pathway, Rt inguinal nodes and establishment of axilloinguinal pathway, then Rt breast moving fluid towards pathways spending extra time in any areas of fibrosis then retracing all steps. Then into left sidelying posterior interaxillary work, then supine again for PROM of the Rt shoulder into flexion, abduction, and D2 flexion positions.   09/20/23: None today due to time constraints   PATIENT EDUCATION:  Education details: lymphedema and need for compression bra, how muscle tightness and scar tissue can compression nerves and cause the zinging pain she is experiencing  Person educated: Patient Education method: Explanation Education comprehension: verbalized understanding  HOME EXERCISE PROGRAM: Try compression bra at night  ASSESSMENT:  CLINICAL IMPRESSION: Pt tolerated treatment well.  Noted medial fibrosis and more scar tissue/mm tightness laterally.   Reviewed and reeducated pt on self MLD meeting her MLD goal.    OBJECTIVE IMPAIRMENTS: decreased knowledge of condition, decreased ROM, decreased strength, increased edema, increased fascial restrictions, increased muscle spasms, impaired UE functional use, postural dysfunction, and pain.   ACTIVITY LIMITATIONS: carrying, lifting, and reach over head  PARTICIPATION LIMITATIONS: meal prep, cleaning, laundry, driving, shopping, community activity, and occupation  PERSONAL FACTORS: Fitness and Time since onset of injury/illness/exacerbation are also affecting patient's functional outcome.   REHAB POTENTIAL: Good  CLINICAL DECISION MAKING: Stable/uncomplicated  EVALUATION COMPLEXITY: Moderate  GOALS: Goals reviewed with patient? Yes  SHORT TERM GOALS=LONG TERM GOALS Target date: 10/18/23  Pt will demonstrate 170 degrees of R shoulder flexion to allow her to reach overhead. Baseline: Goal  status: INITIAL  2.  Pt will demonstrate 160 degrees of R shoulder abduction to allow her to reach out to the side.  Baseline:  Goal status: INITIAL  3.  Pt will report a 50% improvement in pain in R chest, shoulder and upper arm to allow improved comfort. Baseline:  Goal status: INITIAL  4.  Pt will improve grip strength to 50 lbs on R to allow improved function and increase ability to open jars independently.  Baseline:  Goal status: INITIAL  5.  Pt will be independent in self MLD of R breast for long term management.  Baseline:  Goal status: MET  6.  Pt will be independent in a home exercise program for continued stretching and strengthening.  Baseline:  Goal status: INITIAL   PLAN:  PT FREQUENCY: 2x/week  PT DURATION: 4 weeks  PLANNED INTERVENTIONS: 97164- PT Re-evaluation, 97110-Therapeutic exercises, 97530- Therapeutic activity, 97112- Neuromuscular re-education, 97535- Self Care, 16109- Manual therapy, [phone]- Orthotic Fit/training, Patient/Family education, Balance training,  Joint mobilization, Manual lymph drainage, Scar mobilization, Therapeutic exercises, Therapeutic activity, Neuromuscular re-education, Gait training, and Self Care  PLAN FOR NEXT SESSION: begin STM to R pec and lateral trunk, MFR to scar tissue, eventually grip strength and R UE strength, posture exercises, pec stretches, MLD to R breast  Kinlie Janice, Julieanne Manson, PT 10/04/2023, 11:53 AM  Manual Lymph Drainage for Right Breast.   Do daily.  Do slowly. Use flat hands with just enough pressure to stretch the skin. Do not slide over the skin, stretch the skin with the hand. (Stretch  Relax  Move) Lie down or sit comfortably (in a recliner, for example) to do this.   Do circles at each collar bone near the neck 5-7 times (to "wake up" lots of lymph nodes in this area).  Take slow deep breaths, allowing your belly to balloon out as you breathe in, 5x (to "wake up" abdominal lymph nodes).  Do circles in both armpits--stretch skin in small circles to stimulate intact lymph nodes there, 5-7x.  Do Circles in the right groin area, at panty line--stretch skin to stimulate lymph nodes 5-7x.  Redirect fluid from right chest toward left armpit (stretch skin starting at right chest in 3-4 spots working toward left armpit) 3-4x across the chest.  Redirect fluid from right armpit toward right groin (cup your hand around the curve of your right side and do 3-4 "pumps" from armpit to groin) 3-4x down your side.  Draw an imaginary diagonal line from upper outer breast through the nipple area toward lower inner breast.  Direct fluid above this line upward and inward toward the pathway across your chest (established in #5).  Then direct the fluid below this line down and out toward the pathway down your side going towards the left groin (established in #6). Do this for a few minutes or until you feel any improvements.   Then repeat your pathways 2-3x  (#5 and #6)  End with repeating #3 and #4 above  (circles in both  armpits and the left groin)

## 2023-10-06 ENCOUNTER — Ambulatory Visit: Payer: Medicare Other | Admitting: Physical Therapy

## 2023-10-06 ENCOUNTER — Encounter: Payer: Self-pay | Admitting: Physical Therapy

## 2023-10-06 DIAGNOSIS — R293 Abnormal posture: Secondary | ICD-10-CM | POA: Diagnosis not present

## 2023-10-06 DIAGNOSIS — G8929 Other chronic pain: Secondary | ICD-10-CM

## 2023-10-06 DIAGNOSIS — M6281 Muscle weakness (generalized): Secondary | ICD-10-CM

## 2023-10-06 DIAGNOSIS — M25511 Pain in right shoulder: Secondary | ICD-10-CM | POA: Diagnosis not present

## 2023-10-06 DIAGNOSIS — I890 Lymphedema, not elsewhere classified: Secondary | ICD-10-CM

## 2023-10-06 DIAGNOSIS — M25611 Stiffness of right shoulder, not elsewhere classified: Secondary | ICD-10-CM | POA: Diagnosis not present

## 2023-10-06 DIAGNOSIS — C50411 Malignant neoplasm of upper-outer quadrant of right female breast: Secondary | ICD-10-CM | POA: Diagnosis not present

## 2023-10-06 DIAGNOSIS — Z170 Estrogen receptor positive status [ER+]: Secondary | ICD-10-CM | POA: Diagnosis not present

## 2023-10-06 NOTE — Therapy (Signed)
 OUTPATIENT PHYSICAL THERAPY  UPPER EXTREMITY ONCOLOGY   Patient Name: Kelly Rowe MRN: [id number] DOB:18-Feb-1971, 53 y.o., female Today's Date: 10/06/2023  END OF SESSION:  PT End of Session - 10/06/23 1208     Visit Number 4    Number of Visits 9    Date for PT Re-Evaluation 10/18/23    Authorization Type 8 visits 09/20/23-10/18/23    Authorization - Visit Number 4    Authorization - Number of Visits 8    PT Start Time 1207   pt arrived late   PT Stop Time 1254    PT Time Calculation (min) 47 min    Activity Tolerance Patient tolerated treatment well    Behavior During Therapy [facility] for tasks assessed/performed              Past Medical History:  Diagnosis Date   ANEMIA-IRON DEFICIENCY 01/30/2010   ANXIETY 11/27/2007   Arthritis    Asthma 02/25/2011   Cancer (HCC)    breast   Carbuncle and furuncle of trunk 04/16/2010   Chlamydia infection 03/21/2008   DIABETES MELLITUS, TYPE II 08/02/2007   Edema 01/30/2010   ELEVATED BLOOD PRESSURE WITHOUT DIAGNOSIS OF HYPERTENSION 08/02/2007   Family history of breast cancer    Family history of colon cancer    Family history of lung cancer    FREQUENCY, URINARY 05/19/2009   GENITAL HERPES 03/12/2009   GERD (gastroesophageal reflux disease)    History of kidney stones    History of radiation therapy 07/27/18- 09/06/18   Right Breast, Right Axillary and Supraclavicular nodes 50 Gy in 25 fractions, Right Breast Boost 10 Gy in 5 fractions   HIV (human immunodeficiency virus infection) (HCC) 2009   HIV INFECTION 03/12/2009   HSV (herpes simplex virus) infection    HYPERLIPIDEMIA 08/02/2007   Hypertension    Metrorrhagia 03/21/2008   PONV (postoperative nausea and vomiting)    woke up crying per patient    RETENTION, URINE 05/19/2009   Sleep apnea    Trichomonas infection 01/19/2010   VITAMIN D DEFICIENCY 01/30/2010   Past Surgical History:  Procedure Laterality Date   ABDOMINAL HYSTERECTOMY  07/22/2010   TAH WITH PRESERVATION OF BOTH  TUBES AND OVARIES   BREAST LUMPECTOMY Right 2019   BREAST LUMPECTOMY WITH RADIOACTIVE SEED AND SENTINEL LYMPH NODE BIOPSY Right 02/23/2018   Procedure: BREAST LUMPECTOMY WITH RADIOACTIVE SEED AND SENTINEL LYMPH NODE BIOPSY;  Surgeon: Almond Lint, MD;  Location: [facility];  Service: General;  Laterality: Right;   CHOLECYSTECTOMY     ENDOMETRIAL ABLATION  01/11/2008   HER OPTION   ESOPHAGOGASTRODUODENOSCOPY     MULTIPLE TOOTH EXTRACTIONS     PORT-A-CATH REMOVAL Left 05/08/2019   Procedure: REMOVAL PORT-A-CATH;  Surgeon: Almond Lint, MD;  Location: [facility];  Service: General;  Laterality: Left;   PORTACATH PLACEMENT Left 02/23/2018   Procedure: INSERTION PORT-A-CATH;  Surgeon: Almond Lint, MD;  Location: [facility];  Service: General;  Laterality: Left;   PORTACATH PLACEMENT N/A 04/21/2018   Procedure: PORT-A-CATH REVISION;  Surgeon: Almond Lint, MD;  Location: [facility];  Service: General;  Laterality: N/A;   TUBAL LIGATION     WISDOM TOOTH EXTRACTION     Patient Active Problem List   Diagnosis Date Noted   Blurred vision, bilateral 06/17/2023   Upper abdominal pain 06/17/2023   Travel advice encounter 11/07/2021   Bilateral knee pain 10/18/2020   Leukopenia 08/19/2020   Osteoarthritis of left knee 11/29/2019   Lymphedema of right  arm 10/11/2019   COVID-19 virus infection 07/02/2019   Type 2 diabetes mellitus with other specified complication (HCC) 12/13/2018   Hematochezia 03/30/2018   Insomnia 03/30/2018   Port-A-Cath in place 03/29/2018   Genetic testing 01/30/2018   Neoplasm of breast, regional lymph node staging category N3b: metastasis in ipsilateral internal mammary lymph node and axillary lymph node (HCC) 01/30/2018   Family history of breast cancer    Family history of colon cancer    Family history of lung cancer    Morbid obesity (HCC) 01/18/2018   Malignant neoplasm of upper-outer quadrant of right breast in female, 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    Achilles tendinitis 11/28/2017   Hot flashes 09/30/2017   Hypertension 09/08/2017   Contusion of left knee and lower leg 11/18/2015   Mild intermittent asthma 08/15/2013   BV (bacterial vaginosis) 08/11/2012   Right knee pain 07/05/2012   GERD (gastroesophageal reflux disease) 07/05/2012   GC (gonococcus) 05/30/2012   Chlamydia 05/30/2012   Left knee pain 01/12/2012   Encounter for long-term (current) use of high-risk medication 02/25/2011   Encounter for well adult exam with abnormal findings 02/14/2011   Vitamin D deficiency 01/30/2010   ANEMIA-IRON DEFICIENCY 01/30/2010   Edema 01/30/2010   HIV (human immunodeficiency virus infection) (HCC) 03/12/2009   GENITAL HERPES 03/12/2009   Depression with anxiety 11/27/2007   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, without long-term current use of insulin (HCC) 08/02/2007   Hyperlipidemia 08/02/2007    PCP: Oliver Barre, MD  REFERRING PROVIDER: Rachel Moulds, MD  REFERRING DIAG: C50.411,Z17.0 (ICD-10-CM) - Malignant neoplasm of upper-outer quadrant of right breast in female, estrogen receptor positive (HCC) C50.911,C77.3 (ICD-10-CM) - Neoplasm of right breast, regional lymph node staging category N3b: metastasis in ipsilateral internal mammary lymph node and axillary lymph node (HCC)  THERAPY DIAG:  Stiffness of right shoulder, not elsewhere classified  Chronic right shoulder pain  Muscle weakness (generalized)  Lymphedema, not elsewhere classified  Abnormal posture  Malignant neoplasm of upper-outer quadrant of right breast in female, estrogen receptor positive (HCC)  ONSET DATE: 08/25/23  Rationale for Evaluation and Treatment: Rehabilitation  SUBJECTIVE:                                                                                                                                                                                           SUBJECTIVE  STATEMENT:  It is feeling better.   EVAL: I have the FlexiTouch. I use it 3x/wk. 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 So I started back  working Jan 13th. I drive four hours. I am doing all my driving with the R hand. I have a sleeve and I wear it sometimes. I have to pull the night time garment off at night because of the hot flashes.   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 Chemotherapy completed 07/05/18, Herceptin started with chemo and continues through August of this year. Radiation 07/27/18-09/06/18 to the Rt breast, taking tamoxifen,  Other medical history to include asthma, DM, HTN, HIV, and hysterectomy  PAIN:  Are you having pain? None currently NPRS scale: 0/10 at the worst 7/10  Pain location: shoulder and breast Pain orientation: Right  PAIN TYPE: sharp Pain description: intermittent  Aggravating factors: nothing Relieving factors: nothing - thinks it was better before working  PRECAUTIONS:  DM, HTN, HIV  RED FLAGS: None   WEIGHT BEARING RESTRICTIONS: No  FALLS:  Has patient fallen in last 6 months? 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  COGNITION: Overall cognitive status: Within functional limits for tasks assessed   PALPATION: Increased scar tissue in R axilla at scar line and in R lateral trunk, increased fibrosis to inner medial breast  OBSERVATIONS / OTHER ASSESSMENTS: R breast with increased  pore size  POSTURE: forward head and rounded shoulders  UPPER EXTREMITY AROM/PROM:  A/PROM RIGHT   eval  RIGHT 10/06/23  Shoulder extension 60   Shoulder flexion 150 155  Shoulder abduction 131 155  Shoulder internal rotation 40   Shoulder external rotation 80     (Blank rows = not tested)  A/PROM LEFT   eval  Shoulder extension 56  Shoulder flexion 177  Shoulder abduction 170  Shoulder internal rotation 51  Shoulder external rotation 90    (Blank rows = not tested)   GRIP STRENGTH: R: 36 lbs, 26 lbs, 16 lbs - AVG: 26 lbs - Norm is 65 lbs           L:  21 lbs, 25 lbs, 22 lbs - AVG: 22 lbs - Norm is 57 lbs   LYMPHEDEMA ASSESSMENTS:   LANDMARK RIGHT  eval  At axilla  42  15 cm proximal to olecranon process 40.5  10 cm proximal to olecranon process 35.5  Olecranon process 27.5  15 cm proximal to ulnar styloid process 28.5  10 cm proximal to ulnar styloid process 24.2  Just proximal to ulnar styloid process 17  Across hand at thumb web space 20.2  At base of 2nd digit 6.1  (Blank rows = not tested)  LANDMARK LEFT  eval  At axilla  43  15 cm proximal to olecranon process 39.9  10 cm proximal to olecranon process 35  Olecranon process 27.5  15 cm proximal to ulnar styloid process 28.6  10 cm proximal to ulnar styloid process 23.6  Just proximal to ulnar styloid process 17.5  Across hand at thumb web space 19.1  At base of 2nd digit 6  (Blank rows = not tested)   LLIS: Flowsheet Row Outpatient  Rehab from 09/20/2023 in [facility]  Lymphedema Life Impact Scale Total Score 64.71 %                                                                                                                                TREATMENT DATE:  Pt permission and consent throughout each step of examination and treatment with modification and draping if requested when working on sensitive areas 10/06/23: Self Care:  Focused on teaching / reviewing self  MLD. Reviewed in supine with HOB elevated around 45deg to mimic home set up.  Pt required cueing for sequencing and direction of stretch but demonstrated good speed and skin stretch. Had pt demonstrate entire sequence during first half of session. Manual Therapy: In supine: Short neck, superficial and deep abdominals, 5 diaphragmatic breaths, bil axillary nodes and establishment of interaxillary pathway, Rt inguinal nodes and establishment of axilloinguinal pathway, then Rt breast moving fluid towards pathways spending extra time in any areas of fibrosis then retracing all steps.  PROM of the Rt shoulder into flexion, abduction, and ER  Scar tissue work to the lumpectomy incision 10/04/23: Self Care:  Focused on teaching / reviewing self MLD as pt has learned this previously but has not done it much due to getting a flexitouch pump.  Discussed how pump is not great and getting underneath and around the edges so that the hand is preferable.  Reviewed in supine with HOB elevated around 45deg to mimic home set up.  Most cueing needed for reminder of steps and hand placements. Gave new Handout.  Manual Therapy: In supine: Short neck, superficial and deep abdominals, 5 diaphragmatic breaths, bil axillary nodes and establishment of interaxillary pathway, Rt inguinal nodes and establishment of axilloinguinal pathway, then Rt breast moving fluid towards pathways spending extra time in any areas of fibrosis then retracing all steps. Then into left sidelying posterior interaxillary work, then supine again for PROM of the Rt shoulder into flexion, abduction, and D2 flexion positions. Scar tissue work to the lumpectomy incision with cocoa butter working all directions  09/29/23: In supine: Short neck, superficial and deep abdominals, 5 diaphragmatic breaths, bil axillary nodes and establishment of interaxillary pathway, Rt inguinal nodes and establishment of axilloinguinal pathway, then Rt breast moving fluid towards  pathways spending extra time in any areas of fibrosis then retracing all steps. Then into left sidelying posterior interaxillary work, then supine again for PROM of the Rt shoulder into flexion, abduction, and D2 flexion positions.   09/20/23: None today due to time constraints   PATIENT EDUCATION:  Education details: lymphedema and need for compression bra, how muscle tightness and scar tissue can compression nerves and cause the zinging pain she is experiencing  Person educated: Patient Education method: Explanation Education comprehension: verbalized understanding  HOME EXERCISE PROGRAM: Try compression bra at night  ASSESSMENT:  CLINICAL IMPRESSION: Continued to instruct pt in proper self MLD technique and had her return  demosntrate each step and provided v/c and t/c for direction of stretch and sequence. Educated pt on importance of following handout when doing self MLD at  home.   OBJECTIVE IMPAIRMENTS: decreased knowledge of condition, decreased ROM, decreased strength, increased edema, increased fascial restrictions, increased muscle spasms, impaired UE functional use, postural dysfunction, and pain.   ACTIVITY LIMITATIONS: carrying, lifting, and reach over head  PARTICIPATION LIMITATIONS: meal prep, cleaning, laundry, driving, shopping, community activity, and occupation  PERSONAL FACTORS: Fitness and Time since onset of injury/illness/exacerbation are also affecting patient's functional outcome.   REHAB POTENTIAL: Good  CLINICAL DECISION MAKING: Stable/uncomplicated  EVALUATION COMPLEXITY: Moderate  GOALS: Goals reviewed with patient? Yes  SHORT TERM GOALS=LONG TERM GOALS Target date: 10/18/23  Pt will demonstrate 170 degrees of R shoulder flexion to allow her to reach overhead. Baseline: Goal status: INITIAL  2.  Pt will demonstrate 160 degrees of R shoulder abduction to allow her to reach out to the side.  Baseline:  Goal status: INITIAL  3.  Pt will report a  50% improvement in pain in R chest, shoulder and upper arm to allow improved comfort. Baseline:  Goal status: INITIAL  4.  Pt will improve grip strength to 50 lbs on R to allow improved function and increase ability to open jars independently.  Baseline:  Goal status: INITIAL  5.  Pt will be independent in self MLD of R breast for long term management.  Baseline:  Goal status: MET  6.  Pt will be independent in a home exercise program for continued stretching and strengthening.  Baseline:  Goal status: INITIAL   PLAN:  PT FREQUENCY: 2x/week  PT DURATION: 4 weeks  PLANNED INTERVENTIONS: 97164- PT Re-evaluation, 97110-Therapeutic exercises, 97530- Therapeutic activity, 97112- Neuromuscular re-education, 97535- Self Care, 16109- Manual therapy, [phone]- Orthotic Fit/training, Patient/Family education, Balance training, Joint mobilization, Manual lymph drainage, Scar mobilization, Therapeutic exercises, Therapeutic activity, Neuromuscular re-education, Gait training, and Self Care  PLAN FOR NEXT SESSION: cont STM to R pec and lateral trunk, MFR to scar tissue, eventually grip strength and R UE strength, posture exercises, pec stretches, assess indep with MLD to R breast  Leonette Most, PT 10/06/2023, 1:00 PM  Manual Lymph Drainage for Right Breast.   Do daily.  Do slowly. Use flat hands with just enough pressure to stretch the skin. Do not slide over the skin, stretch the skin with the hand. (Stretch  Relax  Move) Lie down or sit comfortably (in a recliner, for example) to do this.   Do circles at each collar bone near the neck 5-7 times (to "wake up" lots of lymph nodes in this area).  Take slow deep breaths, allowing your belly to balloon out as you breathe in, 5x (to "wake up" abdominal lymph nodes).  Do circles in both armpits--stretch skin in small circles to stimulate intact lymph nodes there, 5-7x.  Do Circles in the right groin area, at panty line--stretch skin  to stimulate lymph nodes 5-7x.  Redirect fluid from right chest toward left armpit (stretch skin starting at right chest in 3-4 spots working toward left armpit) 3-4x across the chest.  Redirect fluid from right armpit toward right groin (cup your hand around the curve of your right side and do 3-4 "pumps" from armpit to groin) 3-4x down your side.  Draw an imaginary diagonal line from upper outer breast through the nipple area toward lower inner breast.  Direct fluid above this line upward and inward toward the pathway across your  chest (established in #5).  Then direct the fluid below this line down and out toward the pathway down your side going towards the left groin (established in #6). Do this for a few minutes or until you feel any improvements.   Then repeat your pathways 2-3x  (#5 and #6)  End with repeating #3 and #4 above  (circles in both armpits and the left groin)

## 2023-10-07 ENCOUNTER — Other Ambulatory Visit: Payer: Self-pay | Admitting: Hematology and Oncology

## 2023-10-07 DIAGNOSIS — C50911 Malignant neoplasm of unspecified site of right female breast: Secondary | ICD-10-CM

## 2023-10-11 ENCOUNTER — Encounter: Payer: Self-pay | Admitting: Physical Therapy

## 2023-10-11 ENCOUNTER — Ambulatory Visit: Payer: Medicare Other | Admitting: Physical Therapy

## 2023-10-11 DIAGNOSIS — R293 Abnormal posture: Secondary | ICD-10-CM | POA: Diagnosis not present

## 2023-10-11 DIAGNOSIS — I890 Lymphedema, not elsewhere classified: Secondary | ICD-10-CM | POA: Diagnosis not present

## 2023-10-11 DIAGNOSIS — Z170 Estrogen receptor positive status [ER+]: Secondary | ICD-10-CM | POA: Diagnosis not present

## 2023-10-11 DIAGNOSIS — M6281 Muscle weakness (generalized): Secondary | ICD-10-CM | POA: Diagnosis not present

## 2023-10-11 DIAGNOSIS — C50411 Malignant neoplasm of upper-outer quadrant of right female breast: Secondary | ICD-10-CM | POA: Diagnosis not present

## 2023-10-11 DIAGNOSIS — G8929 Other chronic pain: Secondary | ICD-10-CM

## 2023-10-11 DIAGNOSIS — M25511 Pain in right shoulder: Secondary | ICD-10-CM | POA: Diagnosis not present

## 2023-10-11 DIAGNOSIS — M25611 Stiffness of right shoulder, not elsewhere classified: Secondary | ICD-10-CM

## 2023-10-11 NOTE — Therapy (Signed)
 OUTPATIENT PHYSICAL THERAPY  UPPER EXTREMITY ONCOLOGY   Patient Name: Kelly Rowe MRN: [id number] DOB:[birth year], 53 y.o., female Today's Date: 10/11/2023  END OF SESSION:  PT End of Session - 10/11/23 1207     Visit Number 5    Number of Visits 9    Date for PT Re-Evaluation 10/18/23    Authorization Type 8 visits 09/20/23-10/18/23    Authorization - Visit Number 5    Authorization - Number of Visits 8    PT Start Time 1206    PT Stop Time 1257    PT Time Calculation (min) 51 min    Activity Tolerance Patient tolerated treatment well    Behavior During Therapy [facility] for tasks assessed/performed              Past Medical History:  Diagnosis Date   ANEMIA-IRON DEFICIENCY 01/30/2010   ANXIETY 11/27/2007   Arthritis    Asthma 02/25/2011   Cancer (HCC)    breast   Carbuncle and furuncle of trunk 04/16/2010   Chlamydia infection 03/21/2008   DIABETES MELLITUS, TYPE II 08/02/2007   Edema 01/30/2010   ELEVATED BLOOD PRESSURE WITHOUT DIAGNOSIS OF HYPERTENSION 08/02/2007   Family history of breast cancer    Family history of colon cancer    Family history of lung cancer    FREQUENCY, URINARY 05/19/2009   GENITAL HERPES 03/12/2009   GERD (gastroesophageal reflux disease)    History of kidney stones    History of radiation therapy 07/27/18- 09/06/18   Right Breast, Right Axillary and Supraclavicular nodes 50 Gy in 25 fractions, Right Breast Boost 10 Gy in 5 fractions   HIV (human immunodeficiency virus infection) (HCC) 2009   HIV INFECTION 03/12/2009   HSV (herpes simplex virus) infection    HYPERLIPIDEMIA 08/02/2007   Hypertension    Metrorrhagia 03/21/2008   PONV (postoperative nausea and vomiting)    woke up crying per patient    RETENTION, URINE 05/19/2009   Sleep apnea    Trichomonas infection 01/19/2010   VITAMIN D DEFICIENCY 01/30/2010   Past Surgical History:  Procedure Laterality Date   ABDOMINAL HYSTERECTOMY  07/22/2010   TAH WITH PRESERVATION OF BOTH TUBES AND  OVARIES   BREAST LUMPECTOMY Right 2019   BREAST LUMPECTOMY WITH RADIOACTIVE SEED AND SENTINEL LYMPH NODE BIOPSY Right 02/23/2018   Procedure: BREAST LUMPECTOMY WITH RADIOACTIVE SEED AND SENTINEL LYMPH NODE BIOPSY;  Surgeon: Almond Lint, MD;  Location: [facility] OR;  Service: General;  Laterality: Right;   CHOLECYSTECTOMY     ENDOMETRIAL ABLATION  01/11/2008   HER OPTION   ESOPHAGOGASTRODUODENOSCOPY     MULTIPLE TOOTH EXTRACTIONS     PORT-A-CATH REMOVAL Left 05/08/2019   Procedure: REMOVAL PORT-A-CATH;  Surgeon: Almond Lint, MD;  Location: [facility];  Service: General;  Laterality: Left;   PORTACATH PLACEMENT Left 02/23/2018   Procedure: INSERTION PORT-A-CATH;  Surgeon: Almond Lint, MD;  Location: [facility] OR;  Service: General;  Laterality: Left;   PORTACATH PLACEMENT N/A 04/21/2018   Procedure: PORT-A-CATH REVISION;  Surgeon: Almond Lint, MD;  Location: [facility];  Service: General;  Laterality: N/A;   TUBAL LIGATION     WISDOM TOOTH EXTRACTION     Patient Active Problem List   Diagnosis Date Noted   Blurred vision, bilateral 06/17/2023   Upper abdominal pain 06/17/2023   Travel advice encounter 11/07/2021   Bilateral knee pain 10/18/2020   Leukopenia 08/19/2020   Osteoarthritis of left knee 11/29/2019   Lymphedema of right arm 10/11/2019  COVID-19 virus infection 07/02/2019   Type 2 diabetes mellitus with other specified complication (HCC) 12/13/2018   Hematochezia 03/30/2018   Insomnia 03/30/2018   Port-A-Cath in place 03/29/2018   Genetic testing 01/30/2018   Neoplasm of breast, regional lymph node staging category N3b: metastasis in ipsilateral internal mammary lymph node and axillary lymph node (HCC) 01/30/2018   Family history of breast cancer    Family history of colon cancer    Family history of lung cancer    Morbid obesity (HCC) 01/18/2018   Malignant neoplasm of upper-outer quadrant of right breast in female, 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    Achilles tendinitis 11/28/2017   Hot flashes 09/30/2017   Hypertension 09/08/2017   Contusion of left knee and lower leg 11/18/2015   Mild intermittent asthma 08/15/2013   BV (bacterial vaginosis) 08/11/2012   Right knee pain 07/05/2012   GERD (gastroesophageal reflux disease) 07/05/2012   GC (gonococcus) 05/30/2012   Chlamydia 05/30/2012   Left knee pain 01/12/2012   Encounter for long-term (current) use of high-risk medication 02/25/2011   Encounter for well adult exam with abnormal findings 02/14/2011   Vitamin D deficiency 01/30/2010   ANEMIA-IRON DEFICIENCY 01/30/2010   Edema 01/30/2010   HIV (human immunodeficiency virus infection) (HCC) 03/12/2009   GENITAL HERPES 03/12/2009   Depression with anxiety 11/27/2007   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, without long-term current use of insulin (HCC) 08/02/2007   Hyperlipidemia 08/02/2007    PCP: Oliver Barre, MD  REFERRING PROVIDER: Rachel Moulds, MD  REFERRING DIAG: C50.411,Z17.0 (ICD-10-CM) - Malignant neoplasm of upper-outer quadrant of right breast in female, estrogen receptor positive (HCC) C50.911,C77.3 (ICD-10-CM) - Neoplasm of right breast, regional lymph node staging category N3b: metastasis in ipsilateral internal mammary lymph node and axillary lymph node (HCC)  THERAPY DIAG:  Stiffness of right shoulder, not elsewhere classified  Chronic right shoulder pain  Muscle weakness (generalized)  Lymphedema, not elsewhere classified  Abnormal posture  Malignant neoplasm of upper-outer quadrant of right breast in female, estrogen receptor positive (HCC)  ONSET DATE: 08/25/23  Rationale for Evaluation and Treatment: Rehabilitation  SUBJECTIVE:                                                                                                                                                                                           SUBJECTIVE  STATEMENT:  The swelling is feeling ok. The shoulder pain when it comes is about a 5. The shoulder pain happens about every other day.   EVAL: I have the FlexiTouch. I use it 3x/wk. 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 So I started back working Jan 13th. I drive four hours. I am doing all my driving with the R hand. I have a sleeve and I wear it sometimes. I have to pull the night time garment off at night because of the hot flashes.   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 Chemotherapy completed 07/05/18, Herceptin started with chemo and continues through August of this year. Radiation 07/27/18-09/06/18 to the Rt breast, taking tamoxifen,  Other medical history to include asthma, DM, HTN, HIV, and hysterectomy  PAIN:  Are you having pain? None currently NPRS scale: 0/10 at the worst 5/10  Pain location: shoulder and breast Pain orientation: Right  PAIN TYPE: sharp Pain description: intermittent  Aggravating factors: nothing Relieving factors: nothing - thinks it was better before working  PRECAUTIONS:  DM, HTN, HIV  RED FLAGS: None   WEIGHT BEARING RESTRICTIONS: No  FALLS:  Has patient fallen in last 6 months? 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  COGNITION: Overall cognitive status: Within functional limits for tasks assessed   PALPATION: Increased scar tissue in R axilla at scar line and in R lateral trunk,  increased fibrosis to inner medial breast  OBSERVATIONS / OTHER ASSESSMENTS: R breast with increased pore size  POSTURE: forward head and rounded shoulders  UPPER EXTREMITY AROM/PROM:  A/PROM RIGHT   eval  RIGHT 10/06/23  Shoulder extension 60   Shoulder flexion 150 155  Shoulder abduction 131 155  Shoulder internal rotation 40   Shoulder external rotation 80     (Blank rows = not tested)  A/PROM LEFT   eval  Shoulder extension 56  Shoulder flexion 177  Shoulder abduction 170  Shoulder internal rotation 51  Shoulder external rotation 90    (Blank rows = not tested)   GRIP STRENGTH: R: 36 lbs, 26 lbs, 16 lbs - AVG: 26 lbs - Norm is 65 lbs           L:  21 lbs, 25 lbs, 22 lbs - AVG: 22 lbs - Norm is 57 lbs   LYMPHEDEMA ASSESSMENTS:   LANDMARK RIGHT  eval  At axilla  42  15 cm proximal to olecranon process 40.5  10 cm proximal to olecranon process 35.5  Olecranon process 27.5  15 cm proximal to ulnar styloid process 28.5  10 cm proximal to ulnar styloid process 24.2  Just proximal to ulnar styloid process 17  Across hand at thumb web space 20.2  At base of 2nd digit 6.1  (Blank rows = not tested)  LANDMARK LEFT  eval  At axilla  43  15 cm proximal to olecranon process 39.9  10 cm proximal to olecranon process 35  Olecranon process 27.5  15 cm proximal to ulnar styloid process 28.6  10 cm proximal to ulnar styloid process 23.6  Just proximal to ulnar styloid process 17.5  Across hand at thumb web space 19.1  At base of  2nd digit 6  (Blank rows = not tested)   LLIS: Flowsheet Row Outpatient Rehab from 09/20/2023 in [facility]  Lymphedema Life Impact Scale Total Score 64.71 %                                                                                                                                TREATMENT DATE:  Pt permission and consent throughout each step of examination and treatment with modification and draping if  requested when working on sensitive areas 10/11/23: Self Care:  Focused on teaching / reviewing self MLD. Reviewed in supine with HOB elevated around 45deg to mimic home set up.  Pt required cueing for sequence. She initially started with her pathway so educated pt to start with supraclavicular area and then the lymph nodes prior to the pathways. Manual Therapy: In supine: Short neck, superficial and deep abdominals, 5 diaphragmatic breaths, bil axillary nodes and establishment of interaxillary pathway, Rt inguinal nodes and establishment of axilloinguinal pathway, then Rt breast moving fluid towards pathways spending extra time in any areas of fibrosis then retracing all steps. Pt demonstrated first half of session and then therapist completed session educating pt in correct technique.  PROM of the Rt shoulder into flexion, abduction, and ER  Scar tissue work to the lumpectomy incision  10/06/23: Self Care:  Focused on teaching / reviewing self MLD. Reviewed in supine with HOB elevated around 45deg to mimic home set up.  Pt required cueing for sequencing and direction of stretch but demonstrated good speed and skin stretch. Had pt demonstrate entire sequence during first half of session. Manual Therapy: In supine: Short neck, superficial and deep abdominals, 5 diaphragmatic breaths, bil axillary nodes and establishment of interaxillary pathway, Rt inguinal nodes and establishment of axilloinguinal pathway, then Rt breast moving fluid towards pathways spending extra time in any areas of fibrosis then retracing all steps.  PROM of the Rt shoulder into flexion, abduction, and ER  Scar tissue work to the lumpectomy incision 10/04/23: Self Care:  Focused on teaching / reviewing self MLD as pt has learned this previously but has not done it much due to getting a flexitouch pump.  Discussed how pump is not great and getting underneath and around the edges so that the hand is preferable.  Reviewed in supine  with HOB elevated around 45deg to mimic home set up.  Most cueing needed for reminder of steps and hand placements. Gave new Handout.  Manual Therapy: In supine: Short neck, superficial and deep abdominals, 5 diaphragmatic breaths, bil axillary nodes and establishment of interaxillary pathway, Rt inguinal nodes and establishment of axilloinguinal pathway, then Rt breast moving fluid towards pathways spending extra time in any areas of fibrosis then retracing all steps. Then into left sidelying posterior interaxillary work, then supine again for PROM of the Rt shoulder into flexion, abduction, and D2 flexion positions. Scar tissue work to the lumpectomy incision with cocoa  butter working all directions  09/29/23: In supine: Short neck, superficial and deep abdominals, 5 diaphragmatic breaths, bil axillary nodes and establishment of interaxillary pathway, Rt inguinal nodes and establishment of axilloinguinal pathway, then Rt breast moving fluid towards pathways spending extra time in any areas of fibrosis then retracing all steps. Then into left sidelying posterior interaxillary work, then supine again for PROM of the Rt shoulder into flexion, abduction, and D2 flexion positions.   09/20/23: None today due to time constraints   PATIENT EDUCATION:  Education details: lymphedema and need for compression bra, how muscle tightness and scar tissue can compression nerves and cause the zinging pain she is experiencing  Person educated: Patient Education method: Explanation Education comprehension: verbalized understanding  HOME EXERCISE PROGRAM: Try compression bra at night  ASSESSMENT:  CLINICAL IMPRESSION: Continued to instruct pt in proper self MLD technique and had her return demosntrate each step and provided v/c for correct sequence. She demonstrated improved skin stretch today but still had difficulty with the sequence so continued to educate her in proper technique.   OBJECTIVE IMPAIRMENTS:  decreased knowledge of condition, decreased ROM, decreased strength, increased edema, increased fascial restrictions, increased muscle spasms, impaired UE functional use, postural dysfunction, and pain.   ACTIVITY LIMITATIONS: carrying, lifting, and reach over head  PARTICIPATION LIMITATIONS: meal prep, cleaning, laundry, driving, shopping, community activity, and occupation  PERSONAL FACTORS: Fitness and Time since onset of injury/illness/exacerbation are also affecting patient's functional outcome.   REHAB POTENTIAL: Good  CLINICAL DECISION MAKING: Stable/uncomplicated  EVALUATION COMPLEXITY: Moderate  GOALS: Goals reviewed with patient? Yes  SHORT TERM GOALS=LONG TERM GOALS Target date: 10/18/23  Pt will demonstrate 170 degrees of R shoulder flexion to allow her to reach overhead. Baseline: Goal status: INITIAL  2.  Pt will demonstrate 160 degrees of R shoulder abduction to allow her to reach out to the side.  Baseline:  Goal status: INITIAL  3.  Pt will report a 50% improvement in pain in R chest, shoulder and upper arm to allow improved comfort. Baseline:  Goal status: INITIAL  4.  Pt will improve grip strength to 50 lbs on R to allow improved function and increase ability to open jars independently.  Baseline:  Goal status: INITIAL  5.  Pt will be independent in self MLD of R breast for long term management.  Baseline:  Goal status: MET  6.  Pt will be independent in a home exercise program for continued stretching and strengthening.  Baseline:  Goal status: INITIAL   PLAN:  PT FREQUENCY: 2x/week  PT DURATION: 4 weeks  PLANNED INTERVENTIONS: [id number]- PT Re-evaluation, 97110-Therapeutic exercises, 97530- Therapeutic activity, 97112- Neuromuscular re-education, 97535- Self Care, 91478- Manual therapy, [phone]- Orthotic Fit/training, Patient/Family education, Balance training, Joint mobilization, Manual lymph drainage, Scar mobilization, Therapeutic exercises,  Therapeutic activity, Neuromuscular re-education, Gait training, and Self Care  PLAN FOR NEXT SESSION: cont STM to R pec and lateral trunk, MFR to scar tissue, eventually grip strength and R UE strength, posture exercises, pec stretches, assess indep with MLD to R breast  Leonette Most, PT 10/11/2023, 12:58 PM  Manual Lymph Drainage for Right Breast.   Do daily.  Do slowly. Use flat hands with just enough pressure to stretch the skin. Do not slide over the skin, stretch the skin with the hand. (Stretch  Relax  Move) Lie down or sit comfortably (in a recliner, for example) to do this.   Do circles at each collar bone near the neck 5-7 times (to "  wake up" lots of lymph nodes in this area).  Take slow deep breaths, allowing your belly to balloon out as you breathe in, 5x (to "wake up" abdominal lymph nodes).  Do circles in both armpits--stretch skin in small circles to stimulate intact lymph nodes there, 5-7x.  Do Circles in the right groin area, at panty line--stretch skin to stimulate lymph nodes 5-7x.  Redirect fluid from right chest toward left armpit (stretch skin starting at right chest in 3-4 spots working toward left armpit) 3-4x across the chest.  Redirect fluid from right armpit toward right groin (cup your hand around the curve of your right side and do 3-4 "pumps" from armpit to groin) 3-4x down your side.  Draw an imaginary diagonal line from upper outer breast through the nipple area toward lower inner breast.  Direct fluid above this line upward and inward toward the pathway across your chest (established in #5).  Then direct the fluid below this line down and out toward the pathway down your side going towards the left groin (established in #6). Do this for a few minutes or until you feel any improvements.   Then repeat your pathways 2-3x  (#5 and #6)  End with repeating #3 and #4 above  (circles in both armpits and the left groin)

## 2023-10-13 ENCOUNTER — Encounter: Payer: Medicare Other | Admitting: Physical Therapy

## 2023-10-14 ENCOUNTER — Ambulatory Visit: Admitting: Rehabilitation

## 2023-10-15 ENCOUNTER — Other Ambulatory Visit: Payer: Self-pay | Admitting: Hematology and Oncology

## 2023-10-15 DIAGNOSIS — C50911 Malignant neoplasm of unspecified site of right female breast: Secondary | ICD-10-CM

## 2023-10-18 ENCOUNTER — Ambulatory Visit: Payer: Medicare Other | Admitting: Physical Therapy

## 2023-10-20 ENCOUNTER — Ambulatory Visit: Payer: Medicare Other | Admitting: Physical Therapy

## 2023-10-26 ENCOUNTER — Ambulatory Visit: Attending: Hematology and Oncology

## 2023-10-26 DIAGNOSIS — G8929 Other chronic pain: Secondary | ICD-10-CM | POA: Diagnosis not present

## 2023-10-26 DIAGNOSIS — M25611 Stiffness of right shoulder, not elsewhere classified: Secondary | ICD-10-CM | POA: Insufficient documentation

## 2023-10-26 DIAGNOSIS — R293 Abnormal posture: Secondary | ICD-10-CM | POA: Insufficient documentation

## 2023-10-26 DIAGNOSIS — M25511 Pain in right shoulder: Secondary | ICD-10-CM | POA: Diagnosis not present

## 2023-10-26 DIAGNOSIS — C50411 Malignant neoplasm of upper-outer quadrant of right female breast: Secondary | ICD-10-CM | POA: Diagnosis not present

## 2023-10-26 DIAGNOSIS — Z170 Estrogen receptor positive status [ER+]: Secondary | ICD-10-CM | POA: Insufficient documentation

## 2023-10-26 DIAGNOSIS — M6281 Muscle weakness (generalized): Secondary | ICD-10-CM | POA: Diagnosis not present

## 2023-10-26 DIAGNOSIS — I890 Lymphedema, not elsewhere classified: Secondary | ICD-10-CM | POA: Insufficient documentation

## 2023-10-26 NOTE — Addendum Note (Signed)
 Addended by: Idamae Lusher on: 10/26/2023 04:55 PM   Modules accepted: Orders

## 2023-10-26 NOTE — Therapy (Signed)
 OUTPATIENT PHYSICAL THERAPY  UPPER EXTREMITY ONCOLOGY   Patient Name: Kelly Rowe MRN: [id number] DOB:01/19/1971, 53 y.o., female Today's Date: 10/26/2023  END OF SESSION:  PT End of Session - 10/26/23 1209     Visit Number 6    Number of Visits 17    Date for PT Re-Evaluation 11/23/23    Authorization Type 8 visits 09/20/23-10/18/23; new auth submitted 10/26/23    Authorization - Visit Number 1    Authorization - Number of Visits 0    PT Start Time 1206    PT Stop Time 1303    PT Time Calculation (min) 57 min    Activity Tolerance Patient tolerated treatment well    Behavior During Therapy [facility] for tasks assessed/performed              Past Medical History:  Diagnosis Date   ANEMIA-IRON DEFICIENCY 01/30/2010   ANXIETY 11/27/2007   Arthritis    Asthma 02/25/2011   Cancer (HCC)    breast   Carbuncle and furuncle of trunk 04/16/2010   Chlamydia infection 03/21/2008   DIABETES MELLITUS, TYPE II 08/02/2007   Edema 01/30/2010   ELEVATED BLOOD PRESSURE WITHOUT DIAGNOSIS OF HYPERTENSION 08/02/2007   Family history of breast cancer    Family history of colon cancer    Family history of lung cancer    FREQUENCY, URINARY 05/19/2009   GENITAL HERPES 03/12/2009   GERD (gastroesophageal reflux disease)    History of kidney stones    History of radiation therapy 07/27/18- 09/06/18   Right Breast, Right Axillary and Supraclavicular nodes 50 Gy in 25 fractions, Right Breast Boost 10 Gy in 5 fractions   HIV (human immunodeficiency virus infection) (HCC) 2009   HIV INFECTION 03/12/2009   HSV (herpes simplex virus) infection    HYPERLIPIDEMIA 08/02/2007   Hypertension    Metrorrhagia 03/21/2008   PONV (postoperative nausea and vomiting)    woke up crying per patient    RETENTION, URINE 05/19/2009   Sleep apnea    Trichomonas infection 01/19/2010   VITAMIN D DEFICIENCY 01/30/2010   Past Surgical History:  Procedure Laterality Date   ABDOMINAL HYSTERECTOMY  07/22/2010   TAH WITH  PRESERVATION OF BOTH TUBES AND OVARIES   BREAST LUMPECTOMY Right 2019   BREAST LUMPECTOMY WITH RADIOACTIVE SEED AND SENTINEL LYMPH NODE BIOPSY Right 02/23/2018   Procedure: BREAST LUMPECTOMY WITH RADIOACTIVE SEED AND SENTINEL LYMPH NODE BIOPSY;  Surgeon: Almond Lint, MD;  Location: [facility];  Service: General;  Laterality: Right;   CHOLECYSTECTOMY     ENDOMETRIAL ABLATION  01/11/2008   HER OPTION   ESOPHAGOGASTRODUODENOSCOPY     MULTIPLE TOOTH EXTRACTIONS     PORT-A-CATH REMOVAL Left 05/08/2019   Procedure: REMOVAL PORT-A-CATH;  Surgeon: Almond Lint, MD;  Location: [facility];  Service: General;  Laterality: Left;   PORTACATH PLACEMENT Left 02/23/2018   Procedure: INSERTION PORT-A-CATH;  Surgeon: Almond Lint, MD;  Location: [facility] OR;  Service: General;  Laterality: Left;   PORTACATH PLACEMENT N/A 04/21/2018   Procedure: PORT-A-CATH REVISION;  Surgeon: Almond Lint, MD;  Location: [facility];  Service: General;  Laterality: N/A;   TUBAL LIGATION     WISDOM TOOTH EXTRACTION     Patient Active Problem List   Diagnosis Date Noted   Blurred vision, bilateral 06/17/2023   Upper abdominal pain 06/17/2023   Travel advice encounter 11/07/2021   Bilateral knee pain 10/18/2020   Leukopenia 08/19/2020   Osteoarthritis of left knee 11/29/2019   Lymphedema of right  arm 10/11/2019   COVID-19 virus infection 07/02/2019   Type 2 diabetes mellitus with other specified complication (HCC) 12/13/2018   Hematochezia 03/30/2018   Insomnia 03/30/2018   Port-A-Cath in place 03/29/2018   Genetic testing 01/30/2018   Neoplasm of breast, regional lymph node staging category N3b: metastasis in ipsilateral internal mammary lymph node and axillary lymph node (HCC) 01/30/2018   Family history of breast cancer    Family history of colon cancer    Family history of lung cancer    Morbid obesity (HCC) 01/18/2018   Malignant neoplasm of upper-outer quadrant of right breast in female, estrogen receptor  positive (HCC) 01/17/2018   Achilles tendinitis 11/28/2017   Hot flashes 09/30/2017   Hypertension 09/08/2017   Contusion of left knee and lower leg 11/18/2015   Mild intermittent asthma 08/15/2013   BV (bacterial vaginosis) 08/11/2012   Right knee pain 07/05/2012   GERD (gastroesophageal reflux disease) 07/05/2012   GC (gonococcus) 05/30/2012   Chlamydia 05/30/2012   Left knee pain 01/12/2012   Encounter for long-term (current) use of high-risk medication 02/25/2011   Encounter for well adult exam with abnormal findings 02/14/2011   Vitamin D deficiency 01/30/2010   ANEMIA-IRON DEFICIENCY 01/30/2010   Edema 01/30/2010   HIV (human immunodeficiency virus infection) (HCC) 03/12/2009   GENITAL HERPES 03/12/2009   Depression with anxiety 11/27/2007   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, without long-term current use of insulin (HCC) 08/02/2007   Hyperlipidemia 08/02/2007    PCP: Oliver Barre, MD  REFERRING PROVIDER: Rachel Moulds, MD  REFERRING DIAG: C50.411,Z17.0 (ICD-10-CM) - Malignant neoplasm of upper-outer quadrant of right breast in female, estrogen receptor positive (HCC) C50.911,C77.3 (ICD-10-CM) - Neoplasm of right breast, regional lymph node staging category N3b: metastasis in ipsilateral internal mammary lymph node and axillary lymph node (HCC)  THERAPY DIAG:  Stiffness of right shoulder, not elsewhere classified  Chronic right shoulder pain  Muscle weakness (generalized)  Lymphedema, not elsewhere classified  Abnormal posture  Malignant neoplasm of upper-outer quadrant of right breast in female, estrogen receptor positive (HCC)  ONSET DATE: 08/25/23  Rationale for Evaluation and Treatment: Rehabilitation  SUBJECTIVE:                                                                                                                                                                                            SUBJECTIVE STATEMENT:  The shoulder pain is doing better. I'm still having the breast shooting pains. I've been the doing self MLD but I don't always finish bc I fall asleep.  EVAL: I have the FlexiTouch. I  use it 3x/wk. 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 So I started back working Jan 13th. I drive four hours. I am doing all my driving with the R hand. I have a sleeve and I wear it sometimes. I have to pull the night time garment off at night because of the hot flashes.   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 Chemotherapy completed 07/05/18, Herceptin started with chemo and continues through August of this year. Radiation 07/27/18-09/06/18 to the Rt breast, taking tamoxifen,  Other medical history to include asthma, DM, HTN, HIV, and hysterectomy  PAIN:  Are you having pain? None currently NPRS scale: 0/10 at the worst 6/10  Pain location:  breast Pain orientation: Right  PAIN TYPE: sharp Pain description: intermittent  Aggravating factors: probably driving for work Relieving factors: been doing some stretches  PRECAUTIONS:  DM, HTN, HIV  RED FLAGS: None   WEIGHT BEARING RESTRICTIONS: No  FALLS:  Has patient fallen in last 6 months? 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  COGNITION: Overall cognitive status: Within functional limits for tasks assessed   PALPATION: Increased scar tissue in R axilla at  scar line and in R lateral trunk, increased fibrosis to inner medial breast  OBSERVATIONS / OTHER ASSESSMENTS: R breast with increased pore size  POSTURE: forward head and rounded shoulders  UPPER EXTREMITY AROM/PROM:  A/PROM RIGHT   eval  RIGHT 10/06/23 Right 10/26/23  Shoulder extension 60    Shoulder flexion 150 155 143  Shoulder abduction 131 155 141  Shoulder internal rotation 40    Shoulder external rotation 80      (Blank rows = not tested)  A/PROM LEFT   eval  Shoulder extension 56  Shoulder flexion 177  Shoulder abduction 170  Shoulder internal rotation 51  Shoulder external rotation 90    (Blank rows = not tested)   GRIP STRENGTH: R: 36 lbs, 26 lbs, 16 lbs - AVG: 26 lbs - Norm is 65 lbs           L:  21 lbs, 25 lbs, 22 lbs - AVG: 22 lbs - Norm is 57 lbs   LYMPHEDEMA ASSESSMENTS:   LANDMARK RIGHT  eval  At axilla  42  15 cm proximal to olecranon process 40.5  10 cm proximal to olecranon process 35.5  Olecranon process 27.5  15 cm proximal to ulnar styloid process 28.5  10 cm proximal to ulnar styloid process 24.2  Just proximal to ulnar styloid process 17  Across hand at thumb web space 20.2  At base of 2nd digit 6.1  (Blank rows = not tested)  LANDMARK LEFT  eval  At axilla  43  15 cm proximal to olecranon process 39.9  10 cm proximal to olecranon process 35  Olecranon process 27.5  15 cm proximal to ulnar styloid process 28.6  10 cm proximal to ulnar styloid process 23.6  Just proximal to ulnar styloid process  17.5  Across hand at thumb web space 19.1  At base of 2nd digit 6  (Blank rows = not tested)   LLIS: Flowsheet Row Outpatient Rehab from 09/20/2023 in [facility]  Lymphedema Life Impact Scale Total Score 64.71 %                                                                                                                                TREATMENT DATE:  Pt permission and consent throughout each step of  examination and treatment with modification and draping if requested when working on sensitive areas 10/26/23: Therapeutic Exercises Reviewed post op exercises with pt and re issued handout for these. She reports feeling good stretches with fingers clasped for OH flex and stargazer. Had her return demo for each and trial in standing and supine to feel different efficacy of varying positions. Encouraged pt to incorporate these throughout her day and at work if time allowed.  Manual Therapy In supine: Short neck, superficial and deep abdominals, superficial and deep abdominals, Lt axillary nodes, anterior intact thorax sequence and establishment of anterior inter-axillary pathway, Rt inguinal nodes and establishment of Rt axillo-inguinal pathway, then Rt superior and medial breast moving fluid towards anterior pathway, then into Lt S/L for focus to lateral and inferior breast and had pt return demo fo steps here, redirecting towards lateral pathway, then finished retracing all steps in supine, spending extra time in any areas of fibrosis.  PROM of the Rt shoulder into flexion, abduction, and D2 with scapular depression throughout  Scar tissue work to the lumpectomy incision and lateral trunk where pt exceptionally tight to palpation. This is very limiting to her end Rt shoulder A/ROM, some softening noted of tissue by end of session.   10/11/23: Self Care:  Focused on teaching / reviewing self MLD. Reviewed in supine with HOB elevated around 45deg to mimic home set up.  Pt required cueing for sequence. She initially started with her pathway so educated pt to start with supraclavicular area and then the lymph nodes prior to the pathways. Manual Therapy: In supine: Short neck, superficial and deep abdominals, 5 diaphragmatic breaths, bil axillary nodes and establishment of interaxillary pathway, Rt inguinal nodes and establishment of axilloinguinal pathway, then Rt breast moving fluid towards pathways spending  extra time in any areas of fibrosis then retracing all steps. Pt demonstrated first half of session and then therapist completed session educating pt in correct technique.  PROM of the Rt shoulder into flexion, abduction, and ER  Scar tissue work to the lumpectomy incision  10/06/23: Self Care:  Focused on teaching / reviewing self MLD. Reviewed in supine with HOB elevated around 45deg to mimic home set up.  Pt required cueing for sequencing and direction of stretch but demonstrated good speed and skin stretch. Had pt demonstrate entire sequence during first half of session. Manual Therapy: In supine: Short neck, superficial and deep abdominals, 5 diaphragmatic  breaths, bil axillary nodes and establishment of interaxillary pathway, Rt inguinal nodes and establishment of axilloinguinal pathway, then Rt breast moving fluid towards pathways spending extra time in any areas of fibrosis then retracing all steps.  PROM of the Rt shoulder into flexion, abduction, and ER  Scar tissue work to the lumpectomy incision    PATIENT EDUCATION:  Education details: lymphedema and need for compression bra, how muscle tightness and scar tissue can compression nerves and cause the zinging pain she is experiencing  Person educated: Patient Education method: Explanation Education comprehension: verbalized understanding  HOME EXERCISE PROGRAM: Try compression bra at night  ASSESSMENT:  CLINICAL IMPRESSION: Pt comes in reporting improvement noted in her Rt shoulder pain, however her A/ROM is still limited with abd, less so with flex.Encouraged pt to work on consistency with incorporating HEP stretches that were reviewed today into her day and work if able. Then continued and reviewed MLD to Rt breast. Pt will benefit from continued physical therapy at this time to continue towards unmet goals of Rt shoulder A/ROM and further reducing Rt upper quadrant pain along with progressing HEP. MD renewal sent and ins auth  sent requesting more visits.   OBJECTIVE IMPAIRMENTS: decreased knowledge of condition, decreased ROM, decreased strength, increased edema, increased fascial restrictions, increased muscle spasms, impaired UE functional use, postural dysfunction, and pain.   ACTIVITY LIMITATIONS: carrying, lifting, and reach over head  PARTICIPATION LIMITATIONS: meal prep, cleaning, laundry, driving, shopping, community activity, and occupation  PERSONAL FACTORS: Fitness and Time since onset of injury/illness/exacerbation are also affecting patient's functional outcome.   REHAB POTENTIAL: Good  CLINICAL DECISION MAKING: Stable/uncomplicated  EVALUATION COMPLEXITY: Moderate  GOALS: Goals reviewed with patient? Yes  SHORT TERM GOALS=LONG TERM GOALS Target date: 11/23/23  Pt will demonstrate 170 degrees of R shoulder flexion to allow her to reach overhead. Baseline: 150 degrees; 10/26/23 - 143 Goal status: ONGOING  2.  Pt will demonstrate 160 degrees of R shoulder abduction to allow her to reach out to the side.  Baseline:  130 degrees; 10/26/23 - 141 Goal status: ONGOING  3.  Pt will report a 50% improvement in pain in R chest, shoulder and upper arm to allow improved comfort. Baseline: 10/26/23 - Did not rate today but pt reports Rt shoulder pain is much improved, but breast still has shooting intermittent pains Goal status: ONGOING  4.  Pt will improve grip strength to 50 lbs on R to allow improved function and increase ability to open jars independently.  Baseline:  Goal status: ONGOING  5.  Pt will be independent in self MLD of R breast for long term management.  Baseline:  Goal status: MET  6.  Pt will be independent in a home exercise program for continued stretching and strengthening.  Baseline:  Goal status: ONGOING   PLAN:  PT FREQUENCY: 2x/week  PT DURATION: 4 weeks  PLANNED INTERVENTIONS: 97164- PT Re-evaluation, 97110-Therapeutic exercises, 97530- Therapeutic activity, 97112-  Neuromuscular re-education, 97535- Self Care, 16109- Manual therapy, [phone]- Orthotic Fit/training, Patient/Family education, Balance training, Joint mobilization, Manual lymph drainage, Scar mobilization, Therapeutic exercises, Therapeutic activity, Neuromuscular re-education, Gait training, and Self Care  PLAN FOR NEXT SESSION: Renewal done today and new insurance auth sent; cont STM to R pec and lateral trunk, MFR to scar tissue, eventually grip strength and R UE strength, posture exercises (supine scap next if pt repots less axillary tightness), pec stretches, cont to assess indep with MLD to R breast  Hermenia Bers, PTA 10/26/2023, 2:16  PM  Manual Lymph Drainage for Right Breast.   Do daily.  Do slowly. Use flat hands with just enough pressure to stretch the skin. Do not slide over the skin, stretch the skin with the hand. (Stretch  Relax  Move) Lie down or sit comfortably (in a recliner, for example) to do this.   Do circles at each collar bone near the neck 5-7 times (to "wake up" lots of lymph nodes in this area).  Take slow deep breaths, allowing your belly to balloon out as you breathe in, 5x (to "wake up" abdominal lymph nodes).  Do circles in both armpits--stretch skin in small circles to stimulate intact lymph nodes there, 5-7x.  Do Circles in the right groin area, at panty line--stretch skin to stimulate lymph nodes 5-7x.  Redirect fluid from right chest toward left armpit (stretch skin starting at right chest in 3-4 spots working toward left armpit) 3-4x across the chest.  Redirect fluid from right armpit toward right groin (cup your hand around the curve of your right side and do 3-4 "pumps" from armpit to groin) 3-4x down your side.  Draw an imaginary diagonal line from upper outer breast through the nipple area toward lower inner breast.  Direct fluid above this line upward and inward toward the pathway across your chest (established in #5).  Then direct the  fluid below this line down and out toward the pathway down your side going towards the left groin (established in #6). Do this for a few minutes or until you feel any improvements.   Then repeat your pathways 2-3x  (#5 and #6)  End with repeating #3 and #4 above  (circles in both armpits and the left groin)

## 2023-11-02 ENCOUNTER — Ambulatory Visit

## 2023-11-02 DIAGNOSIS — I890 Lymphedema, not elsewhere classified: Secondary | ICD-10-CM

## 2023-11-02 DIAGNOSIS — M25511 Pain in right shoulder: Secondary | ICD-10-CM

## 2023-11-02 DIAGNOSIS — R293 Abnormal posture: Secondary | ICD-10-CM

## 2023-11-02 DIAGNOSIS — M6281 Muscle weakness (generalized): Secondary | ICD-10-CM

## 2023-11-02 DIAGNOSIS — M25611 Stiffness of right shoulder, not elsewhere classified: Secondary | ICD-10-CM

## 2023-11-02 DIAGNOSIS — Z170 Estrogen receptor positive status [ER+]: Secondary | ICD-10-CM

## 2023-11-02 DIAGNOSIS — G8929 Other chronic pain: Secondary | ICD-10-CM

## 2023-11-02 DIAGNOSIS — C50411 Malignant neoplasm of upper-outer quadrant of right female breast: Secondary | ICD-10-CM

## 2023-11-02 NOTE — Therapy (Signed)
 Pt arrived at clinic today but will not be staying for treatment. She reports her son has schizophrenia and is currently at [facility] facility seeking care for this and she needs to be with him as she is his legal caregiver. She was concerned about being penalized by insurance so Informed her that this will not be the case as she is currently experiencing a family medical emergency and this can not be helped. Pt verified her next appt time and reports will be there for that.   Berna Spare, PTA 11/02/23 12:13 PM

## 2023-11-09 ENCOUNTER — Ambulatory Visit (INDEPENDENT_AMBULATORY_CARE_PROVIDER_SITE_OTHER): Admitting: Family Medicine

## 2023-11-09 VITALS — BP 120/92 | HR 101 | Temp 98.7°F | Ht 61.0 in | Wt 233.8 lb

## 2023-11-09 DIAGNOSIS — R0981 Nasal congestion: Secondary | ICD-10-CM

## 2023-11-09 DIAGNOSIS — J069 Acute upper respiratory infection, unspecified: Secondary | ICD-10-CM

## 2023-11-09 DIAGNOSIS — B9689 Other specified bacterial agents as the cause of diseases classified elsewhere: Secondary | ICD-10-CM

## 2023-11-09 DIAGNOSIS — R051 Acute cough: Secondary | ICD-10-CM | POA: Diagnosis not present

## 2023-11-09 LAB — POCT INFLUENZA A/B
Influenza A, POC: NEGATIVE
Influenza B, POC: NEGATIVE

## 2023-11-09 LAB — POC COVID19 BINAXNOW: SARS Coronavirus 2 Ag: NEGATIVE

## 2023-11-09 MED ORDER — AZITHROMYCIN 250 MG PO TABS: ORAL_TABLET | ORAL | 0 refills | Status: AC

## 2023-11-09 MED ORDER — HYDROCODONE BIT-HOMATROP MBR 5-1.5 MG/5ML PO SOLN: 5.0000 mL | Freq: Three times a day (TID) | ORAL | 0 refills | Status: DC | PRN

## 2023-11-09 MED ORDER — FLUCONAZOLE 150 MG PO TABS: 150.0000 mg | ORAL_TABLET | Freq: Once | ORAL | 0 refills | Status: AC

## 2023-11-09 NOTE — Progress Notes (Signed)
 Acute Office Visit  Subjective:     Patient ID: Kelly Rowe, female    DOB: 1970/09/18, 53 y.o.   MRN: [id number]  Chief Complaint  Patient presents with   Acute Visit    Started on Friday, sore throat, hoarse, nasal congestion, cough productive mostly, sometimes dry. Has tried Mucinex, Ibuprofen , Tylenol , Cough drops, and nasal sprays.    HPI Patient is in today for evaluation of sore throat, hoarse voice, nasal congestion, productive cough, fatigue for the last 7 days. Has tried Mucinex, ibuprofen , Tylenol , cough drops and nasal sprays with little relief. Denies known sick contacts. Denies abdominal pain, nausea, vomiting, diarrhea, rash, fever, chills, other symptoms.  Medical hx as outlined below.  ROS Per HPI      Objective:    BP (!) 120/92 (BP Location: Left Arm, Patient Position: Sitting)   Pulse (!) 101   Temp 98.7 F (37.1 C) (Temporal)   Ht 5\' 1"  (1.549 m)   Wt 233 lb 12.8 oz (106.1 kg)   LMP 06/21/2010   SpO2 95%   BMI 44.18 kg/m    Physical Exam Vitals and nursing note reviewed.  Constitutional:      General: She is not in acute distress.    Appearance: She is obese. She is ill-appearing.     Comments: Appears fatigued  HENT:     Head: Normocephalic and atraumatic.     Right Ear: Tympanic membrane, ear canal and external ear normal.     Left Ear: Tympanic membrane, ear canal and external ear normal.     Nose: Congestion and rhinorrhea present.     Mouth/Throat:     Mouth: Mucous membranes are moist.     Pharynx: Oropharynx is clear. No oropharyngeal exudate or posterior oropharyngeal erythema.     Comments: Oropharyngeal cobblestoning , hoarse voice Eyes:     Extraocular Movements: Extraocular movements intact.  Cardiovascular:     Rate and Rhythm: Regular rhythm. Tachycardia present.     Heart sounds: Normal heart sounds.  Pulmonary:     Effort: Pulmonary effort is normal. No respiratory distress.     Breath sounds: No wheezing,  rhonchi or rales.     Comments: Productive cough in office Musculoskeletal:     Cervical back: Normal range of motion and neck supple.  Lymphadenopathy:     Cervical: Cervical adenopathy present.  Skin:    General: Skin is warm and dry.  Neurological:     General: No focal deficit present.     Mental Status: She is alert and oriented to person, place, and time.     Results for orders placed or performed in visit on 11/09/23  POCT Influenza A/B  Result Value Ref Range   Influenza A, POC Negative Negative   Influenza B, POC Negative Negative  POC COVID-19 BinaxNow  Result Value Ref Range   SARS Coronavirus 2 Ag Negative Negative        Assessment & Plan:   Bacterial URI -     Azithromycin ; Take 2 tablets on day 1, then 1 tablet daily on days 2 through 5  Dispense: 6 tablet; Refill: 0 -     Fluconazole ; Take 1 tablet (150 mg total) by mouth once for 1 dose. Take second tablet if still having symptoms after 3 days  Dispense: 2 tablet; Refill: 0 -     HYDROcodone  Bit-Homatrop MBr; Take 5 mLs by mouth every 8 (eight) hours as needed for cough.  Dispense: 120 mL; Refill: 0  Nasal congestion -     POCT Influenza A/B -     POC COVID-19 BinaxNow  Acute cough -     HYDROcodone  Bit-Homatrop MBr; Take 5 mLs by mouth every 8 (eight) hours as needed for cough.  Dispense: 120 mL; Refill: 0  Viral testing is negative  Meds ordered this encounter  Medications   azithromycin  (ZITHROMAX ) 250 MG tablet    Sig: Take 2 tablets on day 1, then 1 tablet daily on days 2 through 5    Dispense:  6 tablet    Refill:  0   fluconazole  (DIFLUCAN ) 150 MG tablet    Sig: Take 1 tablet (150 mg total) by mouth once for 1 dose. Take second tablet if still having symptoms after 3 days    Dispense:  2 tablet    Refill:  0   HYDROcodone  bit-homatropine (HYCODAN) 5-1.5 MG/5ML syrup    Sig: Take 5 mLs by mouth every 8 (eight) hours as needed for cough.    Dispense:  120 mL    Refill:  0    Return if  symptoms worsen or fail to improve.  Wellington Half, FNP

## 2023-11-14 ENCOUNTER — Ambulatory Visit

## 2023-11-14 DIAGNOSIS — C50411 Malignant neoplasm of upper-outer quadrant of right female breast: Secondary | ICD-10-CM | POA: Diagnosis not present

## 2023-11-14 DIAGNOSIS — Z170 Estrogen receptor positive status [ER+]: Secondary | ICD-10-CM | POA: Diagnosis not present

## 2023-11-14 DIAGNOSIS — R293 Abnormal posture: Secondary | ICD-10-CM

## 2023-11-14 DIAGNOSIS — G8929 Other chronic pain: Secondary | ICD-10-CM

## 2023-11-14 DIAGNOSIS — M25511 Pain in right shoulder: Secondary | ICD-10-CM | POA: Diagnosis not present

## 2023-11-14 DIAGNOSIS — I890 Lymphedema, not elsewhere classified: Secondary | ICD-10-CM

## 2023-11-14 DIAGNOSIS — M6281 Muscle weakness (generalized): Secondary | ICD-10-CM

## 2023-11-14 DIAGNOSIS — M25611 Stiffness of right shoulder, not elsewhere classified: Secondary | ICD-10-CM | POA: Diagnosis not present

## 2023-11-14 NOTE — Therapy (Signed)
 OUTPATIENT PHYSICAL THERAPY  UPPER EXTREMITY ONCOLOGY TREATMENT  Patient Name: Kelly Rowe MRN: [id number] DOB:1970/09/21, 53 y.o., female Today's Date: 11/14/2023  END OF SESSION:  PT End of Session - 11/14/23 1212     Visit Number 7    Number of Visits 17    Date for PT Re-Evaluation 11/23/23    Authorization Type 8 visits 09/20/23-10/18/23; new auth submitted 10/26/23; 4/2 - 11/30/23    Authorization - Visit Number 2    Authorization - Number of Visits 8    PT Start Time 1210   pt arrived late   PT Stop Time 1252   pt had to leave early to pick up her son   PT Time Calculation (min) 42 min    Activity Tolerance Patient tolerated treatment well    Behavior During Therapy [facility] for tasks assessed/performed              Past Medical History:  Diagnosis Date   ANEMIA-IRON DEFICIENCY 01/30/2010   ANXIETY 11/27/2007   Arthritis    Asthma 02/25/2011   Cancer (HCC)    breast   Carbuncle and furuncle of trunk 04/16/2010   Chlamydia infection 03/21/2008   DIABETES MELLITUS, TYPE II 08/02/2007   Edema 01/30/2010   ELEVATED BLOOD PRESSURE WITHOUT DIAGNOSIS OF HYPERTENSION 08/02/2007   Family history of breast cancer    Family history of colon cancer    Family history of lung cancer    FREQUENCY, URINARY 05/19/2009   GENITAL HERPES 03/12/2009   GERD (gastroesophageal reflux disease)    History of kidney stones    History of radiation therapy 07/27/18- 09/06/18   Right Breast, Right Axillary and Supraclavicular nodes 50 Gy in 25 fractions, Right Breast Boost 10 Gy in 5 fractions   HIV (human immunodeficiency virus infection) (HCC) 2009   HIV INFECTION 03/12/2009   HSV (herpes simplex virus) infection    HYPERLIPIDEMIA 08/02/2007   Hypertension    Metrorrhagia 03/21/2008   PONV (postoperative nausea and vomiting)    woke up crying per patient    RETENTION, URINE 05/19/2009   Sleep apnea    Trichomonas infection 01/19/2010   VITAMIN D  DEFICIENCY 01/30/2010   Past Surgical History:   Procedure Laterality Date   ABDOMINAL HYSTERECTOMY  07/22/2010   TAH WITH PRESERVATION OF BOTH TUBES AND OVARIES   BREAST LUMPECTOMY Right 2019   BREAST LUMPECTOMY WITH RADIOACTIVE SEED AND SENTINEL LYMPH NODE BIOPSY Right 02/23/2018   Procedure: BREAST LUMPECTOMY WITH RADIOACTIVE SEED AND SENTINEL LYMPH NODE BIOPSY;  Surgeon: Lockie Rima, MD;  Location: [facility] OR;  Service: General;  Laterality: Right;   CHOLECYSTECTOMY     ENDOMETRIAL ABLATION  01/11/2008   HER OPTION   ESOPHAGOGASTRODUODENOSCOPY     MULTIPLE TOOTH EXTRACTIONS     PORT-A-CATH REMOVAL Left 05/08/2019   Procedure: REMOVAL PORT-A-CATH;  Surgeon: Lockie Rima, MD;  Location: [facility];  Service: General;  Laterality: Left;   PORTACATH PLACEMENT Left 02/23/2018   Procedure: INSERTION PORT-A-CATH;  Surgeon: Lockie Rima, MD;  Location: [facility] OR;  Service: General;  Laterality: Left;   PORTACATH PLACEMENT N/A 04/21/2018   Procedure: PORT-A-CATH REVISION;  Surgeon: Lockie Rima, MD;  Location: [facility];  Service: General;  Laterality: N/A;   TUBAL LIGATION     WISDOM TOOTH EXTRACTION     Patient Active Problem List   Diagnosis Date Noted   Blurred vision, bilateral 06/17/2023   Upper abdominal pain 06/17/2023   Travel advice encounter 11/07/2021   Bilateral knee  pain 10/18/2020   Leukopenia 08/19/2020   Osteoarthritis of left knee 11/29/2019   Lymphedema of right arm 10/11/2019   COVID-19 virus infection 07/02/2019   Type 2 diabetes mellitus with other specified complication (HCC) 12/13/2018   Hematochezia 03/30/2018   Insomnia 03/30/2018   Port-A-Cath in place 03/29/2018   Genetic testing 01/30/2018   Neoplasm of breast, regional lymph node staging category N3b: metastasis in ipsilateral internal mammary lymph node and axillary lymph node (HCC) 01/30/2018   Family history of breast cancer    Family history of colon cancer    Family history of lung cancer    Morbid obesity (HCC) 01/18/2018   Malignant  neoplasm of upper-outer quadrant of right breast in female, 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   Achilles tendinitis 11/28/2017   Hot flashes 09/30/2017   Hypertension 09/08/2017   Contusion of left knee and lower leg 11/18/2015   Mild intermittent asthma 08/15/2013   BV (bacterial vaginosis) 08/11/2012   Right knee pain 07/05/2012   GERD (gastroesophageal reflux disease) 07/05/2012   GC (gonococcus) 05/30/2012   Chlamydia 05/30/2012   Left knee pain 01/12/2012   Encounter for long-term (current) use of high-risk medication 02/25/2011   Encounter for well adult exam with abnormal findings 02/14/2011   Vitamin D  deficiency 01/30/2010   ANEMIA-IRON DEFICIENCY 01/30/2010   Edema 01/30/2010   HIV (human immunodeficiency virus infection) (HCC) 03/12/2009   GENITAL HERPES 03/12/2009   Depression with anxiety 11/27/2007   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, without long-term current use of insulin  (HCC) 08/02/2007   Hyperlipidemia 08/02/2007    PCP: Rosalia Colonel, MD  REFERRING PROVIDER: Murleen Arms, MD  REFERRING DIAG: C50.411,Z17.0 (ICD-10-CM) - Malignant neoplasm of upper-outer quadrant of right breast in female, estrogen receptor positive (HCC) C50.911,C77.3 (ICD-10-CM) - Neoplasm of right breast, regional lymph node staging category N3b: metastasis in ipsilateral internal mammary lymph node and axillary lymph node (HCC)  THERAPY DIAG:  Stiffness of right shoulder, not elsewhere classified  Chronic right shoulder pain  Muscle weakness (generalized)  Lymphedema, not elsewhere classified  Abnormal posture  Malignant neoplasm of upper-outer quadrant of right breast in female, estrogen receptor positive (HCC)  ONSET DATE: 08/25/23  Rationale for Evaluation and Treatment: Rehabilitation  SUBJECTIVE:                                                                                                                                                                                            SUBJECTIVE STATEMENT:  I don't have the shooting pains in my breast anymore. I've been doing the self MLD a  few times a week at least. The tightness in my chest is getting better as well. I was sick last all last week. I'm finishing the antibiotic today that the doctor put me on.   EVAL: I have the FlexiTouch. I use it 3x/wk. 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 So I started back working Jan 13th. I drive four hours. I am doing all my driving with the R hand. I have a sleeve and I wear it sometimes. I have to pull the night time garment off at night because of the hot flashes.   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 Chemotherapy completed 07/05/18, Herceptin  started with chemo and continues through August of this year. Radiation 07/27/18-09/06/18 to the Rt breast, taking tamoxifen ,  Other medical history to include asthma, DM, HTN, HIV, and hysterectomy  PAIN:  Are you having pain? No  PRECAUTIONS:  DM, HTN, HIV  RED FLAGS: None   WEIGHT BEARING RESTRICTIONS: No  FALLS:  Has patient fallen in last 6 months? 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  COGNITION: Overall cognitive status: Within functional limits for tasks assessed   PALPATION: Increased scar tissue in R axilla at scar line and in R lateral trunk, increased fibrosis to inner  medial breast  OBSERVATIONS / OTHER ASSESSMENTS: R breast with increased pore size  POSTURE: forward head and rounded shoulders  UPPER EXTREMITY AROM/PROM:  A/PROM RIGHT   eval  RIGHT 10/06/23 Right 10/26/23  Shoulder extension 60    Shoulder flexion 150 155 143  Shoulder abduction 131 155 141  Shoulder internal rotation 40    Shoulder external rotation 80      (Blank rows = not tested)  A/PROM LEFT   eval  Shoulder extension 56  Shoulder flexion 177  Shoulder abduction 170  Shoulder internal rotation 51  Shoulder external rotation 90    (Blank rows = not tested)   GRIP STRENGTH: R: 36 lbs, 26 lbs, 16 lbs - AVG: 26 lbs - Norm is 65 lbs           L:  21 lbs, 25 lbs, 22 lbs - AVG: 22 lbs - Norm is 57 lbs   LYMPHEDEMA ASSESSMENTS:   LANDMARK RIGHT  eval  At axilla  42  15 cm proximal to olecranon process 40.5  10 cm proximal to olecranon process 35.5  Olecranon process 27.5  15 cm proximal to ulnar styloid process 28.5  10 cm proximal to ulnar styloid process 24.2  Just proximal to ulnar styloid process 17  Across hand at thumb web space 20.2  At base of 2nd digit 6.1  (Blank rows = not tested)  LANDMARK LEFT  eval  At axilla  43  15 cm proximal to olecranon process 39.9  10 cm proximal to olecranon process 35  Olecranon process 27.5  15 cm proximal to ulnar styloid process 28.6  10 cm proximal to ulnar styloid process 23.6  Just proximal to ulnar styloid process 17.5  Across hand at thumb web space 19.1  At base of 2nd digit 6  (Blank rows = not tested)   LLIS: Flowsheet Row Outpatient Rehab from 09/20/2023 in [facility]  Lymphedema Life Impact Scale Total Score 64.71 %                                                                                                                                TREATMENT DATE:  Pt permission and consent throughout each step of examination and treatment with modification and draping if  requested when working on sensitive areas 11/14/23: Therapeutic Activities Supine scapular series for following: Bil horz abd and bil UE er x 10 each, and then narrow and wide grip flex and then bil UE D2 x 5 each returning therapist for each and handout issued. Also educated pt on how she can progress to performing these on the wall later as she feels ready.  Manual Therapy In supine: Short neck, superficial and deep abdominals, Lt axillary nodes, anterior intact thorax sequence and establishment of anterior inter-axillary pathway, Rt inguinal nodes and establishment of Rt axillo-inguinal pathway, then Rt superior and medial breast moving fluid towards anterior pathway, then finished retracing all steps.  PROM of the Rt shoulder into flexion, abduction, and D2 with scapular depression throughout by therapist Scar tissue work to the lumpectomy incision, lateral trunk tightness feels much improved today.   10/26/23: Therapeutic Exercises Reviewed post op exercises with pt and re issued handout for these. She reports feeling good stretches with fingers clasped for OH flex and stargazer. Had her return demo for each and trial in standing and supine to feel different efficacy of varying positions. Encouraged pt to incorporate these throughout her day and at work if time allowed.  Manual Therapy In supine: Short neck, superficial and deep abdominals, superficial and deep abdominals, Lt axillary nodes, anterior intact thorax sequence and establishment of anterior inter-axillary pathway, Rt inguinal nodes and establishment of Rt axillo-inguinal pathway, then Rt superior and medial breast moving fluid towards anterior pathway, then into Lt S/L for focus to lateral and inferior breast and had pt return demo fo steps here, redirecting towards lateral pathway, then finished retracing all steps in supine, spending extra time in any areas of fibrosis.  PROM of the Rt shoulder into flexion, abduction, and D2 with  scapular depression throughout  Scar tissue work to the lumpectomy incision and lateral trunk where pt exceptionally tight to palpation. This is very limiting to her end Rt shoulder A/ROM, some softening noted of tissue by end of session.   10/11/23: Self Care:  Focused on teaching / reviewing self MLD. Reviewed in supine with HOB elevated around 45deg to mimic home set up.  Pt required cueing for sequence. She initially started with her pathway so educated pt to start with supraclavicular area and then the lymph nodes prior to the pathways.  Manual Therapy: In supine: Short neck, superficial and deep abdominals, 5 diaphragmatic breaths, bil axillary nodes and establishment of interaxillary pathway, Rt inguinal nodes and establishment of axilloinguinal pathway, then Rt breast moving fluid towards pathways spending extra time in any areas of fibrosis then retracing all steps. Pt demonstrated first half of session and then therapist completed session educating pt in correct technique.  PROM of the Rt shoulder into flexion, abduction, and ER  Scar tissue work to the lumpectomy incision      PATIENT EDUCATION:  Education details:Supine scapular series with yellow theraband Person educated: Patient Education method: Explanation, demonstration, tactile and VC's, and handout issued Education comprehension: verbalized understanding, returned demo, will benefit from further review  HOME EXERCISE PROGRAM: Try compression bra at night 11/14/23 - Supine scapular series   ASSESSMENT:  CLINICAL IMPRESSION: Pt returns this week reporting overall feeling good improvements since she was here last. The shooting pains in her breast are gone and she feels the tightness at her Rt chest is also much better. Today progressed her to include postural strengthening exercises with theraband. She was challenged by these. Then continued with MLD to Rt breast which is much softer today than at last session. Also  continued with P/ROM of Rt shoulder, her motion is also improved, and her lateral trunk tightness is softer and not as limiting to her motion as well.   OBJECTIVE IMPAIRMENTS: decreased knowledge of condition, decreased ROM, decreased strength, increased edema, increased fascial restrictions, increased muscle spasms, impaired UE functional use, postural dysfunction, and pain.   ACTIVITY LIMITATIONS: carrying, lifting, and reach over head  PARTICIPATION LIMITATIONS: meal prep, cleaning, laundry, driving, shopping, community activity, and occupation  PERSONAL FACTORS: Fitness and Time since onset of injury/illness/exacerbation are also affecting patient's functional outcome.   REHAB POTENTIAL: Good  CLINICAL DECISION MAKING: Stable/uncomplicated  EVALUATION COMPLEXITY: Moderate  GOALS: Goals reviewed with patient? Yes  SHORT TERM GOALS=LONG TERM GOALS Target date: 11/23/23  Pt will demonstrate 170 degrees of R shoulder flexion to allow her to reach overhead. Baseline: 150 degrees; 10/26/23 - 143 Goal status: ONGOING  2.  Pt will demonstrate 160 degrees of R shoulder abduction to allow her to reach out to the side.  Baseline:  130 degrees; 10/26/23 - 141 Goal status: ONGOING  3.  Pt will report a 50% improvement in pain in R chest, shoulder and upper arm to allow improved comfort. Baseline: 10/26/23 - Did not rate today but pt reports Rt shoulder pain is much improved, but breast still has shooting intermittent pains Goal status: ONGOING  4.  Pt will improve grip strength to 50 lbs on R to allow improved function and increase ability to open jars independently.  Baseline:  Goal status: ONGOING  5.  Pt will be independent in self MLD of R breast for long term management.  Baseline:  Goal status: MET  6.  Pt will be independent in a home exercise program for continued stretching and strengthening.  Baseline:  Goal status: ONGOING   PLAN:  PT FREQUENCY: 2x/week  PT DURATION: 4  weeks  PLANNED INTERVENTIONS: 97164- PT Re-evaluation, 97110-Therapeutic exercises, 97530- Therapeutic activity, 97112- Neuromuscular re-education, 97535- Self Care, 42595- Manual therapy, [phone]- Orthotic Fit/training, Patient/Family education, Balance training, Joint mobilization, Manual lymph drainage, Scar mobilization, Therapeutic exercises, Therapeutic activity, Neuromuscular re-education, Gait training, and Self Care  PLAN FOR NEXT SESSION: Review supine scapular series; Cont STM to R pec and lateral trunk prn, MFR to scar tissue, eventually grip strength and R  UE strength, posture exercises, cont to assess indep with MLD to R breast  Denyce Flank, PTA 11/14/2023, 1:03 PM  Manual Lymph Drainage for Right Breast.   Do daily.  Do slowly. Use flat hands with just enough pressure to stretch the skin. Do not slide over the skin, stretch the skin with the hand. (Stretch  Relax  Move) Lie down or sit comfortably (in a recliner, for example) to do this.   Do circles at each collar bone near the neck 5-7 times (to "wake up" lots of lymph nodes in this area).  Take slow deep breaths, allowing your belly to balloon out as you breathe in, 5x (to "wake up" abdominal lymph nodes).  Do circles in both armpits--stretch skin in small circles to stimulate intact lymph nodes there, 5-7x.  Do Circles in the right groin area, at panty line--stretch skin to stimulate lymph nodes 5-7x.  Redirect fluid from right chest toward left armpit (stretch skin starting at right chest in 3-4 spots working toward left armpit) 3-4x across the chest.  Redirect fluid from right armpit toward right groin (cup your hand around the curve of your right side and do 3-4 "pumps" from armpit to groin) 3-4x down your side.  Draw an imaginary diagonal line from upper outer breast through the nipple area toward lower inner breast.  Direct fluid above this line upward and inward toward the pathway across your chest  (established in #5).  Then direct the fluid below this line down and out toward the pathway down your side going towards the left groin (established in #6). Do this for a few minutes or until you feel any improvements.   Then repeat your pathways 2-3x  (#5 and #6)  End with repeating #3 and #4 above  (circles in both armpits and the left groin)   Over Head Pull: Narrow and Wide Grip   Cancer Rehab [phone]   On back, knees bent, feet flat, band across thighs, elbows straight but relaxed. Pull hands apart (start). Keeping elbows straight, bring arms up and over head, hands toward floor. Keep pull steady on band. Hold momentarily. Return slowly, keeping pull steady, back to start. Then do same with a wider grip on the band (past shoulder width) Repeat _5-10__ times. Band color __yellow____   Side Pull: Double Arm   On back, knees bent, feet flat. Arms perpendicular to body, shoulder level, elbows straight but relaxed. Pull arms out to sides, elbows straight. Resistance band comes across collarbones, hands toward floor. Hold momentarily. Slowly return to starting position. Repeat _5-10__ times. Band color _yellow____   Sword   On back, knees bent, feet flat, left hand on left hip, right hand above left. Pull right arm DIAGONALLY (hip to shoulder) across chest. Bring right arm along head toward floor. Hold momentarily. Slowly return to starting position. Repeat _5-10__ times. Do with left arm. Band color _yellow_____   Shoulder Rotation: Double Arm   On back, knees bent, feet flat, elbows tucked at sides, bent 90, hands palms up. Pull hands apart and down toward floor, keeping elbows near sides. Hold momentarily. Slowly return to starting position. Repeat _5-10__ times. Band color __yellow____

## 2023-11-14 NOTE — Patient Instructions (Addendum)
 Kelly Rowe

## 2023-11-16 ENCOUNTER — Ambulatory Visit

## 2023-11-16 DIAGNOSIS — M25511 Pain in right shoulder: Secondary | ICD-10-CM | POA: Diagnosis not present

## 2023-11-16 DIAGNOSIS — C50411 Malignant neoplasm of upper-outer quadrant of right female breast: Secondary | ICD-10-CM

## 2023-11-16 DIAGNOSIS — M6281 Muscle weakness (generalized): Secondary | ICD-10-CM | POA: Diagnosis not present

## 2023-11-16 DIAGNOSIS — M25611 Stiffness of right shoulder, not elsewhere classified: Secondary | ICD-10-CM

## 2023-11-16 DIAGNOSIS — I890 Lymphedema, not elsewhere classified: Secondary | ICD-10-CM

## 2023-11-16 DIAGNOSIS — R293 Abnormal posture: Secondary | ICD-10-CM | POA: Diagnosis not present

## 2023-11-16 DIAGNOSIS — Z170 Estrogen receptor positive status [ER+]: Secondary | ICD-10-CM | POA: Diagnosis not present

## 2023-11-16 DIAGNOSIS — G8929 Other chronic pain: Secondary | ICD-10-CM | POA: Diagnosis not present

## 2023-11-16 NOTE — Therapy (Signed)
 OUTPATIENT PHYSICAL THERAPY  UPPER EXTREMITY ONCOLOGY TREATMENT  Patient Name: Kelly Rowe MRN: [id number] DOB:Jan 09, 1971, 53 y.o., female Today's Date: 11/16/2023  END OF SESSION:  PT End of Session - 11/16/23 1250     Visit Number 8    Number of Visits 12    Date for PT Re-Evaluation 11/23/23    Authorization Type 8 visits 09/20/23-10/18/23; new auth submitted 10/26/23; 4/2 - 11/30/23    Authorization - Visit Number 3    Authorization - Number of Visits 8    PT Start Time 1205    PT Stop Time 1247   pt needed to leave early to pick up her son   PT Time Calculation (min) 42 min    Activity Tolerance Patient tolerated treatment well    Behavior During Therapy [facility] for tasks assessed/performed              Past Medical History:  Diagnosis Date   ANEMIA-IRON DEFICIENCY 01/30/2010   ANXIETY 11/27/2007   Arthritis    Asthma 02/25/2011   Cancer (HCC)    breast   Carbuncle and furuncle of trunk 04/16/2010   Chlamydia infection 03/21/2008   DIABETES MELLITUS, TYPE II 08/02/2007   Edema 01/30/2010   ELEVATED BLOOD PRESSURE WITHOUT DIAGNOSIS OF HYPERTENSION 08/02/2007   Family history of breast cancer    Family history of colon cancer    Family history of lung cancer    FREQUENCY, URINARY 05/19/2009   GENITAL HERPES 03/12/2009   GERD (gastroesophageal reflux disease)    History of kidney stones    History of radiation therapy 07/27/18- 09/06/18   Right Breast, Right Axillary and Supraclavicular nodes 50 Gy in 25 fractions, Right Breast Boost 10 Gy in 5 fractions   HIV (human immunodeficiency virus infection) (HCC) 2009   HIV INFECTION 03/12/2009   HSV (herpes simplex virus) infection    HYPERLIPIDEMIA 08/02/2007   Hypertension    Metrorrhagia 03/21/2008   PONV (postoperative nausea and vomiting)    woke up crying per patient    RETENTION, URINE 05/19/2009   Sleep apnea    Trichomonas infection 01/19/2010   VITAMIN D  DEFICIENCY 01/30/2010   Past Surgical History:  Procedure  Laterality Date   ABDOMINAL HYSTERECTOMY  07/22/2010   TAH WITH PRESERVATION OF BOTH TUBES AND OVARIES   BREAST LUMPECTOMY Right 2019   BREAST LUMPECTOMY WITH RADIOACTIVE SEED AND SENTINEL LYMPH NODE BIOPSY Right 02/23/2018   Procedure: BREAST LUMPECTOMY WITH RADIOACTIVE SEED AND SENTINEL LYMPH NODE BIOPSY;  Surgeon: Lockie Rima, MD;  Location: [facility];  Service: General;  Laterality: Right;   CHOLECYSTECTOMY     ENDOMETRIAL ABLATION  01/11/2008   HER OPTION   ESOPHAGOGASTRODUODENOSCOPY     MULTIPLE TOOTH EXTRACTIONS     PORT-A-CATH REMOVAL Left 05/08/2019   Procedure: REMOVAL PORT-A-CATH;  Surgeon: Lockie Rima, MD;  Location: [facility];  Service: General;  Laterality: Left;   PORTACATH PLACEMENT Left 02/23/2018   Procedure: INSERTION PORT-A-CATH;  Surgeon: Lockie Rima, MD;  Location: [facility] OR;  Service: General;  Laterality: Left;   PORTACATH PLACEMENT N/A 04/21/2018   Procedure: PORT-A-CATH REVISION;  Surgeon: Lockie Rima, MD;  Location: [facility];  Service: General;  Laterality: N/A;   TUBAL LIGATION     WISDOM TOOTH EXTRACTION     Patient Active Problem List   Diagnosis Date Noted   Blurred vision, bilateral 06/17/2023   Upper abdominal pain 06/17/2023   Travel advice encounter 11/07/2021   Bilateral knee pain 10/18/2020  Leukopenia 08/19/2020   Osteoarthritis of left knee 11/29/2019   Lymphedema of right arm 10/11/2019   COVID-19 virus infection 07/02/2019   Type 2 diabetes mellitus with other specified complication (HCC) 12/13/2018   Hematochezia 03/30/2018   Insomnia 03/30/2018   Port-A-Cath in place 03/29/2018   Genetic testing 01/30/2018   Neoplasm of breast, regional lymph node staging category N3b: metastasis in ipsilateral internal mammary lymph node and axillary lymph node (HCC) 01/30/2018   Family history of breast cancer    Family history of colon cancer    Family history of lung cancer    Morbid obesity (HCC) 01/18/2018   Malignant neoplasm of  upper-outer quadrant of right breast in female, 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   Achilles tendinitis 11/28/2017   Hot flashes 09/30/2017   Hypertension 09/08/2017   Contusion of left knee and lower leg 11/18/2015   Mild intermittent asthma 08/15/2013   BV (bacterial vaginosis) 08/11/2012   Right knee pain 07/05/2012   GERD (gastroesophageal reflux disease) 07/05/2012   GC (gonococcus) 05/30/2012   Chlamydia 05/30/2012   Left knee pain 01/12/2012   Encounter for long-term (current) use of high-risk medication 02/25/2011   Encounter for well adult exam with abnormal findings 02/14/2011   Vitamin D  deficiency 01/30/2010   ANEMIA-IRON DEFICIENCY 01/30/2010   Edema 01/30/2010   HIV (human immunodeficiency virus infection) (HCC) 03/12/2009   GENITAL HERPES 03/12/2009   Depression with anxiety 11/27/2007   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, without long-term current use of insulin  (HCC) 08/02/2007   Hyperlipidemia 08/02/2007    PCP: Rosalia Colonel, MD  REFERRING PROVIDER: Murleen Arms, MD  REFERRING DIAG: C50.411,Z17.0 (ICD-10-CM) - Malignant neoplasm of upper-outer quadrant of right breast in female, estrogen receptor positive (HCC) C50.911,C77.3 (ICD-10-CM) - Neoplasm of right breast, regional lymph node staging category N3b: metastasis in ipsilateral internal mammary lymph node and axillary lymph node (HCC)  THERAPY DIAG:  Stiffness of right shoulder, not elsewhere classified  Chronic right shoulder pain  Muscle weakness (generalized)  Lymphedema, not elsewhere classified  Abnormal posture  Malignant neoplasm of upper-outer quadrant of right breast in female, estrogen receptor positive (HCC)  ONSET DATE: 08/25/23  Rationale for Evaluation and Treatment: Rehabilitation  SUBJECTIVE:                                                                                                                                                                                            SUBJECTIVE STATEMENT:  I felt really good after I left here last time. The tightness was better and my breast wasn't as heavy.  EVAL: I have the FlexiTouch. I use it 3x/wk. 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 So I started back working Jan 13th. I drive four hours. I am doing all my driving with the R hand. I have a sleeve and I wear it sometimes. I have to pull the night time garment off at night because of the hot flashes.   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 Chemotherapy completed 07/05/18, Herceptin  started with chemo and continues through August of this year. Radiation 07/27/18-09/06/18 to the Rt breast, taking tamoxifen ,  Other medical history to include asthma, DM, HTN, HIV, and hysterectomy  PAIN:  Are you having pain? No  PRECAUTIONS:  DM, HTN, HIV  RED FLAGS: None   WEIGHT BEARING RESTRICTIONS: No  FALLS:  Has patient fallen in last 6 months? 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  COGNITION: Overall cognitive status: Within functional limits for tasks assessed   PALPATION: Increased scar tissue in R axilla at scar line and in R lateral trunk, increased fibrosis to inner medial breast  OBSERVATIONS / OTHER ASSESSMENTS: R breast with increased pore size  POSTURE: forward head and rounded shoulders  UPPER EXTREMITY  AROM/PROM:  A/PROM RIGHT   eval  RIGHT 10/06/23 Right 10/26/23  Shoulder extension 60    Shoulder flexion 150 155 143  Shoulder abduction 131 155 141  Shoulder internal rotation 40    Shoulder external rotation 80      (Blank rows = not tested)  A/PROM LEFT   eval  Shoulder extension 56  Shoulder flexion 177  Shoulder abduction 170  Shoulder internal rotation 51  Shoulder external rotation 90    (Blank rows = not tested)   GRIP STRENGTH: R: 36 lbs, 26 lbs, 16 lbs - AVG: 26 lbs - Norm is 65 lbs           L:  21 lbs, 25 lbs, 22 lbs - AVG: 22 lbs - Norm is 57 lbs   LYMPHEDEMA ASSESSMENTS:   LANDMARK RIGHT  eval  At axilla  42  15 cm proximal to olecranon process 40.5  10 cm proximal to olecranon process 35.5  Olecranon process 27.5  15 cm proximal to ulnar styloid process 28.5  10 cm proximal to ulnar styloid process 24.2  Just proximal to ulnar styloid process 17  Across hand at thumb web space 20.2  At base of 2nd digit 6.1  (Blank rows = not tested)  LANDMARK LEFT  eval  At axilla  43  15 cm proximal to olecranon process 39.9  10 cm proximal to olecranon process 35  Olecranon process 27.5  15 cm proximal to ulnar styloid process 28.6  10 cm proximal to ulnar styloid process 23.6  Just proximal to ulnar styloid process 17.5  Across hand at thumb web space 19.1  At base of 2nd digit 6  (Blank rows = not tested)   LLIS: Flowsheet Row Outpatient Rehab from  09/20/2023 in [facility]  Lymphedema Life Impact Scale Total Score 64.71 %                                                                                                                                TREATMENT DATE:  Pt permission and consent throughout each step of examination and treatment with modification and draping if requested when working on sensitive areas 11/16/23: Manual Therapy In supine: Short neck, superficial and deep abdominals, Lt axillary nodes, anterior  intact thorax sequence and establishment of anterior inter-axillary pathway, Rt inguinal nodes and establishment of Rt axillo-inguinal pathway, then Rt superior and medial breast moving fluid towards anterior pathway, then finished retracing all steps.  PROM of the Rt shoulder into flexion, abduction, and D2 with scapular depression throughout by therapist Scar tissue work to the lumpectomy incision, lateral trunk tightness  11/14/23: Therapeutic Activities Supine scapular series for following: Bil horz abd and bil UE er x 10 each, and then narrow and wide grip flex and then bil UE D2 x 5 each returning therapist for each and handout issued. Also educated pt on how she can progress to performing these on the wall later as she feels ready.  Manual Therapy In supine: Short neck, superficial and deep abdominals, Lt axillary nodes, anterior intact thorax sequence and establishment of anterior inter-axillary pathway, Rt inguinal nodes and establishment of Rt axillo-inguinal pathway, then Rt superior and medial breast moving fluid towards anterior pathway, then finished retracing all steps.  PROM of the Rt shoulder into flexion, abduction, and D2 with scapular depression throughout by therapist Scar tissue work to the lumpectomy incision, lateral trunk tightness feels much improved today.   10/26/23: Therapeutic Exercises Reviewed post op exercises with pt and re issued handout for these. She reports feeling good stretches with fingers clasped for OH flex and stargazer. Had her return demo for each and trial in standing and supine to feel different efficacy of varying positions. Encouraged pt to incorporate these throughout her day and at work if time allowed.  Manual Therapy In supine: Short neck, superficial and deep abdominals, superficial and deep abdominals, Lt axillary nodes, anterior intact thorax sequence and establishment of anterior inter-axillary pathway, Rt inguinal nodes and establishment of Rt  axillo-inguinal pathway, then Rt superior and medial breast moving fluid towards anterior pathway, then into Lt S/L for focus to lateral and inferior breast and had pt return demo fo steps here, redirecting towards lateral pathway, then finished retracing all steps in supine, spending extra time in any areas of fibrosis.  PROM of the Rt shoulder into flexion, abduction, and D2 with scapular depression throughout  Scar tissue work to the lumpectomy incision and lateral trunk where pt exceptionally tight to palpation. This is very limiting to her end Rt shoulder A/ROM, some softening noted of tissue by end of session.        PATIENT EDUCATION:  Education details:Supine scapular series  with yellow theraband Person educated: Patient Education method: Explanation, demonstration, tactile and VC's, and handout issued Education comprehension: verbalized understanding, returned demo, will benefit from further review  HOME EXERCISE PROGRAM: Try compression bra at night 11/14/23 - Supine scapular series   ASSESSMENT:  CLINICAL IMPRESSION: Pt conts to report feeling good improvements after each session. Her Rt breast is feeling less heavy and edematous, and Rt axilla and lateral trunk tightness is improving as well.   OBJECTIVE IMPAIRMENTS: decreased knowledge of condition, decreased ROM, decreased strength, increased edema, increased fascial restrictions, increased muscle spasms, impaired UE functional use, postural dysfunction, and pain.   ACTIVITY LIMITATIONS: carrying, lifting, and reach over head  PARTICIPATION LIMITATIONS: meal prep, cleaning, laundry, driving, shopping, community activity, and occupation  PERSONAL FACTORS: Fitness and Time since onset of injury/illness/exacerbation are also affecting patient's functional outcome.   REHAB POTENTIAL: Good  CLINICAL DECISION MAKING: Stable/uncomplicated  EVALUATION COMPLEXITY: Moderate  GOALS: Goals reviewed with patient? Yes  SHORT  TERM GOALS=LONG TERM GOALS Target date: 11/23/23  Pt will demonstrate 170 degrees of R shoulder flexion to allow her to reach overhead. Baseline: 150 degrees; 10/26/23 - 143 Goal status: ONGOING  2.  Pt will demonstrate 160 degrees of R shoulder abduction to allow her to reach out to the side.  Baseline:  130 degrees; 10/26/23 - 141 Goal status: ONGOING  3.  Pt will report a 50% improvement in pain in R chest, shoulder and upper arm to allow improved comfort. Baseline: 10/26/23 - Did not rate today but pt reports Rt shoulder pain is much improved, but breast still has shooting intermittent pains Goal status: ONGOING  4.  Pt will improve grip strength to 50 lbs on R to allow improved function and increase ability to open jars independently.  Baseline:  Goal status: ONGOING  5.  Pt will be independent in self MLD of R breast for long term management.  Baseline:  Goal status: MET  6.  Pt will be independent in a home exercise program for continued stretching and strengthening.  Baseline:  Goal status: ONGOING   PLAN:  PT FREQUENCY: 2x/week  PT DURATION: 4 weeks  PLANNED INTERVENTIONS: 97164- PT Re-evaluation, 97110-Therapeutic exercises, 97530- Therapeutic activity, 97112- Neuromuscular re-education, 97535- Self Care, 42595- Manual therapy, [phone]- Orthotic Fit/training, Patient/Family education, Balance training, Joint mobilization, Manual lymph drainage, Scar mobilization, Therapeutic exercises, Therapeutic activity, Neuromuscular re-education, Gait training, and Self Care  PLAN FOR NEXT SESSION: Review supine scapular series; Cont STM to R pec and lateral trunk prn, MFR to scar tissue, eventually grip strength and R UE strength, posture exercises, cont to assess indep with MLD to R breast  Denyce Flank, PTA 11/16/2023, 1:04 PM  Manual Lymph Drainage for Right Breast.   Do daily.  Do slowly. Use flat hands with just enough pressure to stretch the skin. Do not slide over  the skin, stretch the skin with the hand. (Stretch  Relax  Move) Lie down or sit comfortably (in a recliner, for example) to do this.   Do circles at each collar bone near the neck 5-7 times (to "wake up" lots of lymph nodes in this area).  Take slow deep breaths, allowing your belly to balloon out as you breathe in, 5x (to "wake up" abdominal lymph nodes).  Do circles in both armpits--stretch skin in small circles to stimulate intact lymph nodes there, 5-7x.  Do Circles in the right groin area, at panty line--stretch skin to stimulate lymph nodes 5-7x.  Redirect fluid from  right chest toward left armpit (stretch skin starting at right chest in 3-4 spots working toward left armpit) 3-4x across the chest.  Redirect fluid from right armpit toward right groin (cup your hand around the curve of your right side and do 3-4 "pumps" from armpit to groin) 3-4x down your side.  Draw an imaginary diagonal line from upper outer breast through the nipple area toward lower inner breast.  Direct fluid above this line upward and inward toward the pathway across your chest (established in #5).  Then direct the fluid below this line down and out toward the pathway down your side going towards the left groin (established in #6). Do this for a few minutes or until you feel any improvements.   Then repeat your pathways 2-3x  (#5 and #6)  End with repeating #3 and #4 above  (circles in both armpits and the left groin)   Over Head Pull: Narrow and Wide Grip   Cancer Rehab [phone]   On back, knees bent, feet flat, band across thighs, elbows straight but relaxed. Pull hands apart (start). Keeping elbows straight, bring arms up and over head, hands toward floor. Keep pull steady on band. Hold momentarily. Return slowly, keeping pull steady, back to start. Then do same with a wider grip on the band (past shoulder width) Repeat _5-10__ times. Band color __yellow____   Side Pull: Double Arm   On back, knees  bent, feet flat. Arms perpendicular to body, shoulder level, elbows straight but relaxed. Pull arms out to sides, elbows straight. Resistance band comes across collarbones, hands toward floor. Hold momentarily. Slowly return to starting position. Repeat _5-10__ times. Band color _yellow____   Sword   On back, knees bent, feet flat, left hand on left hip, right hand above left. Pull right arm DIAGONALLY (hip to shoulder) across chest. Bring right arm along head toward floor. Hold momentarily. Slowly return to starting position. Repeat _5-10__ times. Do with left arm. Band color _yellow_____   Shoulder Rotation: Double Arm   On back, knees bent, feet flat, elbows tucked at sides, bent 90, hands palms up. Pull hands apart and down toward floor, keeping elbows near sides. Hold momentarily. Slowly return to starting position. Repeat _5-10__ times. Band color __yellow____

## 2023-11-18 ENCOUNTER — Encounter: Payer: Self-pay | Admitting: Family Medicine

## 2023-11-18 NOTE — Patient Instructions (Addendum)
 Viral respiratory testing is negative today  I have sent in azithromycin  for you to take.  Take 2 tablets today, then 1 tablet daily for the next 4 days.  I have also sent in Diflucan  for you to take in case of yeast after antibiotic treatment.  If you are having symptoms when you complete antibiotics, may take 1 tablet.  If you are still having symptoms 3 days later, may take the second tablet.  I have sent in hydrocodone  cough syrup for you to take 5 mL once daily in the evening as needed for cough.  This medication may make you sleepy.  Do not drive or operate heavy machinery while taking this medication.  Follow-up with me for new or worsening symptoms.

## 2023-11-22 ENCOUNTER — Ambulatory Visit: Admitting: Physical Therapy

## 2023-11-22 ENCOUNTER — Encounter: Payer: Self-pay | Admitting: Physical Therapy

## 2023-11-22 DIAGNOSIS — Z170 Estrogen receptor positive status [ER+]: Secondary | ICD-10-CM

## 2023-11-22 DIAGNOSIS — I890 Lymphedema, not elsewhere classified: Secondary | ICD-10-CM | POA: Diagnosis not present

## 2023-11-22 DIAGNOSIS — C50411 Malignant neoplasm of upper-outer quadrant of right female breast: Secondary | ICD-10-CM | POA: Diagnosis not present

## 2023-11-22 DIAGNOSIS — R293 Abnormal posture: Secondary | ICD-10-CM

## 2023-11-22 DIAGNOSIS — M25611 Stiffness of right shoulder, not elsewhere classified: Secondary | ICD-10-CM | POA: Diagnosis not present

## 2023-11-22 DIAGNOSIS — M25511 Pain in right shoulder: Secondary | ICD-10-CM | POA: Diagnosis not present

## 2023-11-22 DIAGNOSIS — M6281 Muscle weakness (generalized): Secondary | ICD-10-CM

## 2023-11-22 DIAGNOSIS — G8929 Other chronic pain: Secondary | ICD-10-CM

## 2023-11-22 NOTE — Therapy (Signed)
 OUTPATIENT PHYSICAL THERAPY  UPPER EXTREMITY ONCOLOGY TREATMENT  Patient Name: Kelly Rowe MRN: [id number] DOB:1970-10-17, 53 y.o., female Today's Date: 11/22/2023  END OF SESSION:  PT End of Session - 11/22/23 1214     Visit Number 9    Number of Visits 12    Date for PT Re-Evaluation 11/23/23    Authorization Type 8 visits 09/20/23-10/18/23; new auth submitted 10/26/23; 4/2 - 11/30/23    Authorization - Visit Number 4    Authorization - Number of Visits 8    PT Start Time 1213   pt arrived late   PT Stop Time 1246   pt had to end session early   PT Time Calculation (min) 33 min    Activity Tolerance Patient tolerated treatment well    Behavior During Therapy [facility] for tasks assessed/performed              Past Medical History:  Diagnosis Date   ANEMIA-IRON DEFICIENCY 01/30/2010   ANXIETY 11/27/2007   Arthritis    Asthma 02/25/2011   Cancer (HCC)    breast   Carbuncle and furuncle of trunk 04/16/2010   Chlamydia infection 03/21/2008   DIABETES MELLITUS, TYPE II 08/02/2007   Edema 01/30/2010   ELEVATED BLOOD PRESSURE WITHOUT DIAGNOSIS OF HYPERTENSION 08/02/2007   Family history of breast cancer    Family history of colon cancer    Family history of lung cancer    FREQUENCY, URINARY 05/19/2009   GENITAL HERPES 03/12/2009   GERD (gastroesophageal reflux disease)    History of kidney stones    History of radiation therapy 07/27/18- 09/06/18   Right Breast, Right Axillary and Supraclavicular nodes 50 Gy in 25 fractions, Right Breast Boost 10 Gy in 5 fractions   HIV (human immunodeficiency virus infection) (HCC) 2009   HIV INFECTION 03/12/2009   HSV (herpes simplex virus) infection    HYPERLIPIDEMIA 08/02/2007   Hypertension    Metrorrhagia 03/21/2008   PONV (postoperative nausea and vomiting)    woke up crying per patient    RETENTION, URINE 05/19/2009   Sleep apnea    Trichomonas infection 01/19/2010   VITAMIN D  DEFICIENCY 01/30/2010   Past Surgical History:  Procedure  Laterality Date   ABDOMINAL HYSTERECTOMY  07/22/2010   TAH WITH PRESERVATION OF BOTH TUBES AND OVARIES   BREAST LUMPECTOMY Right 2019   BREAST LUMPECTOMY WITH RADIOACTIVE SEED AND SENTINEL LYMPH NODE BIOPSY Right 02/23/2018   Procedure: BREAST LUMPECTOMY WITH RADIOACTIVE SEED AND SENTINEL LYMPH NODE BIOPSY;  Surgeon: Lockie Rima, MD;  Location: [facility];  Service: General;  Laterality: Right;   CHOLECYSTECTOMY     ENDOMETRIAL ABLATION  01/11/2008   HER OPTION   ESOPHAGOGASTRODUODENOSCOPY     MULTIPLE TOOTH EXTRACTIONS     PORT-A-CATH REMOVAL Left 05/08/2019   Procedure: REMOVAL PORT-A-CATH;  Surgeon: Lockie Rima, MD;  Location: [facility];  Service: General;  Laterality: Left;   PORTACATH PLACEMENT Left 02/23/2018   Procedure: INSERTION PORT-A-CATH;  Surgeon: Lockie Rima, MD;  Location: [facility];  Service: General;  Laterality: Left;   PORTACATH PLACEMENT N/A 04/21/2018   Procedure: PORT-A-CATH REVISION;  Surgeon: Lockie Rima, MD;  Location: [facility];  Service: General;  Laterality: N/A;   TUBAL LIGATION     WISDOM TOOTH EXTRACTION     Patient Active Problem List   Diagnosis Date Noted   Blurred vision, bilateral 06/17/2023   Upper abdominal pain 06/17/2023   Travel advice encounter 11/07/2021   Bilateral knee pain 10/18/2020  Leukopenia 08/19/2020   Osteoarthritis of left knee 11/29/2019   Lymphedema of right arm 10/11/2019   COVID-19 virus infection 07/02/2019   Type 2 diabetes mellitus with other specified complication (HCC) 12/13/2018   Hematochezia 03/30/2018   Insomnia 03/30/2018   Port-A-Cath in place 03/29/2018   Genetic testing 01/30/2018   Neoplasm of breast, regional lymph node staging category N3b: metastasis in ipsilateral internal mammary lymph node and axillary lymph node (HCC) 01/30/2018   Family history of breast cancer    Family history of colon cancer    Family history of lung cancer    Morbid obesity (HCC) 01/18/2018   Malignant neoplasm of  upper-outer quadrant of right breast in female, 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   Achilles tendinitis 11/28/2017   Hot flashes 09/30/2017   Hypertension 09/08/2017   Contusion of left knee and lower leg 11/18/2015   Mild intermittent asthma 08/15/2013   BV (bacterial vaginosis) 08/11/2012   Right knee pain 07/05/2012   GERD (gastroesophageal reflux disease) 07/05/2012   GC (gonococcus) 05/30/2012   Chlamydia 05/30/2012   Left knee pain 01/12/2012   Encounter for long-term (current) use of high-risk medication 02/25/2011   Encounter for well adult exam with abnormal findings 02/14/2011   Vitamin D  deficiency 01/30/2010   ANEMIA-IRON DEFICIENCY 01/30/2010   Edema 01/30/2010   HIV (human immunodeficiency virus infection) (HCC) 03/12/2009   GENITAL HERPES 03/12/2009   Depression with anxiety 11/27/2007   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, without long-term current use of insulin  (HCC) 08/02/2007   Hyperlipidemia 08/02/2007    PCP: Rosalia Colonel, MD  REFERRING PROVIDER: Murleen Arms, MD  REFERRING DIAG: C50.411,Z17.0 (ICD-10-CM) - Malignant neoplasm of upper-outer quadrant of right breast in female, estrogen receptor positive (HCC) C50.911,C77.3 (ICD-10-CM) - Neoplasm of right breast, regional lymph node staging category N3b: metastasis in ipsilateral internal mammary lymph node and axillary lymph node (HCC)  THERAPY DIAG:  Stiffness of right shoulder, not elsewhere classified  Chronic right shoulder pain  Muscle weakness (generalized)  Lymphedema, not elsewhere classified  Abnormal posture  Malignant neoplasm of upper-outer quadrant of right breast in female, estrogen receptor positive (HCC)  ONSET DATE: 08/25/23  Rationale for Evaluation and Treatment: Rehabilitation  SUBJECTIVE:                                                                                                                                                                                            SUBJECTIVE STATEMENT:  My breast feels wonderful. My arm is also feeling better. I have been doing all the exercises and massage.  EVAL: I have the FlexiTouch. I use it 3x/wk. 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 So I started back working Jan 13th. I drive four hours. I am doing all my driving with the R hand. I have a sleeve and I wear it sometimes. I have to pull the night time garment off at night because of the hot flashes.   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 Chemotherapy completed 07/05/18, Herceptin  started with chemo and continues through August of this year. Radiation 07/27/18-09/06/18 to the Rt breast, taking tamoxifen ,  Other medical history to include asthma, DM, HTN, HIV, and hysterectomy  PAIN:  Are you having pain? No  PRECAUTIONS:  DM, HTN, HIV  RED FLAGS: None   WEIGHT BEARING RESTRICTIONS: No  FALLS:  Has patient fallen in last 6 months? 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  COGNITION: Overall cognitive status: Within functional limits for tasks assessed   PALPATION: Increased scar tissue in R axilla at scar line and in R lateral trunk, increased fibrosis to inner medial breast  OBSERVATIONS / OTHER ASSESSMENTS: R breast with increased pore size  POSTURE: forward head and rounded shoulders  UPPER EXTREMITY  AROM/PROM:  A/PROM RIGHT   eval  RIGHT 10/06/23 Right 10/26/23  Shoulder extension 60    Shoulder flexion 150 155 143  Shoulder abduction 131 155 141  Shoulder internal rotation 40    Shoulder external rotation 80      (Blank rows = not tested)  A/PROM LEFT   eval  Shoulder extension 56  Shoulder flexion 177  Shoulder abduction 170  Shoulder internal rotation 51  Shoulder external rotation 90    (Blank rows = not tested)   GRIP STRENGTH: R: 36 lbs, 26 lbs, 16 lbs - AVG: 26 lbs - Norm is 65 lbs           L:  21 lbs, 25 lbs, 22 lbs - AVG: 22 lbs - Norm is 57 lbs   LYMPHEDEMA ASSESSMENTS:   LANDMARK RIGHT  eval  At axilla  42  15 cm proximal to olecranon process 40.5  10 cm proximal to olecranon process 35.5  Olecranon process 27.5  15 cm proximal to ulnar styloid process 28.5  10 cm proximal to ulnar styloid process 24.2  Just proximal to ulnar styloid process 17  Across hand at thumb web space 20.2  At base of 2nd digit 6.1  (Blank rows = not tested)  LANDMARK LEFT  eval  At axilla  43  15 cm proximal to olecranon process 39.9  10 cm proximal to olecranon process 35  Olecranon process 27.5  15 cm proximal to ulnar styloid process 28.6  10 cm proximal to ulnar styloid process 23.6  Just proximal to ulnar styloid process 17.5  Across hand at thumb web space 19.1  At base of 2nd digit 6  (Blank rows = not tested)   LLIS: Flowsheet Row Outpatient Rehab from  09/20/2023 in [facility]  Lymphedema Life Impact Scale Total Score 64.71 %                                                                                                                                TREATMENT DATE:  Pt permission and consent throughout each step of examination and treatment with modification and draping if requested when working on sensitive areas 11/22/23: Manual Therapy In supine: Short neck, 5 diaphragmatic breaths, Lt axillary nodes and establishment of  anterior inter-axillary pathway, Rt inguinal nodes and establishment of Rt axillo-inguinal pathway, then Rt superior and medial breast moving fluid towards anterior pathway, then finished retracing all steps.   11/16/23: Manual Therapy In supine: Short neck, superficial and deep abdominals, Lt axillary nodes, anterior intact thorax sequence and establishment of anterior inter-axillary pathway, Rt inguinal nodes and establishment of Rt axillo-inguinal pathway, then Rt superior and medial breast moving fluid towards anterior pathway, then finished retracing all steps.  PROM of the Rt shoulder into flexion, abduction, and D2 with scapular depression throughout by therapist Scar tissue work to the lumpectomy incision, lateral trunk tightness  11/14/23: Therapeutic Activities Supine scapular series for following: Bil horz abd and bil UE er x 10 each, and then narrow and wide grip flex and then bil UE D2 x 5 each returning therapist for each and handout issued. Also educated pt on how she can progress to performing these on the wall later as she feels ready.  Manual Therapy In supine: Short neck, superficial and deep abdominals, Lt axillary nodes, anterior intact thorax sequence and establishment of anterior inter-axillary pathway, Rt inguinal nodes and establishment of Rt axillo-inguinal pathway, then Rt superior and medial breast moving fluid towards anterior pathway, then finished retracing all steps.  PROM of the Rt shoulder into flexion, abduction, and D2 with scapular depression throughout by therapist Scar tissue work to the lumpectomy incision, lateral trunk tightness feels much improved today.   10/26/23: Therapeutic Exercises Reviewed post op exercises with pt and re issued handout for these. She reports feeling good stretches with fingers clasped for OH flex and stargazer. Had her return demo for each and trial in standing and supine to feel different efficacy of varying positions. Encouraged pt  to incorporate these throughout her day and at work if time allowed.  Manual Therapy In supine: Short neck, superficial and deep abdominals, superficial and deep abdominals, Lt axillary nodes, anterior intact thorax sequence and establishment of anterior inter-axillary pathway, Rt inguinal nodes and establishment of Rt axillo-inguinal pathway, then Rt superior and medial breast moving fluid towards anterior pathway, then into Lt S/L for focus to lateral and inferior breast and had pt return demo fo steps here, redirecting towards lateral pathway, then finished retracing all steps in supine, spending extra time in any areas of fibrosis.  PROM of the Rt shoulder into flexion, abduction, and D2 with scapular depression throughout  Scar tissue work  to the lumpectomy incision and lateral trunk where pt exceptionally tight to palpation. This is very limiting to her end Rt shoulder A/ROM, some softening noted of tissue by end of session.        PATIENT EDUCATION:  Education details:Supine scapular series with yellow theraband Person educated: Patient Education method: Explanation, demonstration, tactile and VC's, and handout issued Education comprehension: verbalized understanding, returned demo, will benefit from further review  HOME EXERCISE PROGRAM: Try compression bra at night 11/14/23 - Supine scapular series   ASSESSMENT:  CLINICAL IMPRESSION: Pt reports her breast and arm have been feeling much better. There has been improvement in her fibrosis. Will plan to see pt one last visit and d/c at that time.   OBJECTIVE IMPAIRMENTS: decreased knowledge of condition, decreased ROM, decreased strength, increased edema, increased fascial restrictions, increased muscle spasms, impaired UE functional use, postural dysfunction, and pain.   ACTIVITY LIMITATIONS: carrying, lifting, and reach over head  PARTICIPATION LIMITATIONS: meal prep, cleaning, laundry, driving, shopping, community activity, and  occupation  PERSONAL FACTORS: Fitness and Time since onset of injury/illness/exacerbation are also affecting patient's functional outcome.   REHAB POTENTIAL: Good  CLINICAL DECISION MAKING: Stable/uncomplicated  EVALUATION COMPLEXITY: Moderate  GOALS: Goals reviewed with patient? Yes  SHORT TERM GOALS=LONG TERM GOALS Target date: 11/23/23  Pt will demonstrate 170 degrees of R shoulder flexion to allow her to reach overhead. Baseline: 150 degrees; 10/26/23 - 143 Goal status: ONGOING  2.  Pt will demonstrate 160 degrees of R shoulder abduction to allow her to reach out to the side.  Baseline:  130 degrees; 10/26/23 - 141 Goal status: ONGOING  3.  Pt will report a 50% improvement in pain in R chest, shoulder and upper arm to allow improved comfort. Baseline: 10/26/23 - Did not rate today but pt reports Rt shoulder pain is much improved, but breast still has shooting intermittent pains Goal status: ONGOING  4.  Pt will improve grip strength to 50 lbs on R to allow improved function and increase ability to open jars independently.  Baseline:  Goal status: ONGOING  5.  Pt will be independent in self MLD of R breast for long term management.  Baseline:  Goal status: MET  6.  Pt will be independent in a home exercise program for continued stretching and strengthening.  Baseline:  Goal status: ONGOING   PLAN:  PT FREQUENCY: 2x/week  PT DURATION: 4 weeks  PLANNED INTERVENTIONS: 97164- PT Re-evaluation, 97110-Therapeutic exercises, 97530- Therapeutic activity, 97112- Neuromuscular re-education, 97535- Self Care, 72536- Manual therapy, [phone]- Orthotic Fit/training, Patient/Family education, Balance training, Joint mobilization, Manual lymph drainage, Scar mobilization, Therapeutic exercises, Therapeutic activity, Neuromuscular re-education, Gait training, and Self Care  PLAN FOR NEXT SESSION: d/c next visit, Review supine scapular series; Cont STM to R pec and lateral trunk prn, MFR to  scar tissue, eventually grip strength and R UE strength, posture exercises, cont to assess indep with MLD to R breast  Deedee Farmer, PT 11/22/2023, 12:54 PM  Manual Lymph Drainage for Right Breast.   Do daily.  Do slowly. Use flat hands with just enough pressure to stretch the skin. Do not slide over the skin, stretch the skin with the hand. (Stretch  Relax  Move) Lie down or sit comfortably (in a recliner, for example) to do this.   Do circles at each collar bone near the neck 5-7 times (to "wake up" lots of lymph nodes in this area).  Take slow deep breaths, allowing your belly to balloon  out as you breathe in, 5x (to "wake up" abdominal lymph nodes).  Do circles in both armpits--stretch skin in small circles to stimulate intact lymph nodes there, 5-7x.  Do Circles in the right groin area, at panty line--stretch skin to stimulate lymph nodes 5-7x.  Redirect fluid from right chest toward left armpit (stretch skin starting at right chest in 3-4 spots working toward left armpit) 3-4x across the chest.  Redirect fluid from right armpit toward right groin (cup your hand around the curve of your right side and do 3-4 "pumps" from armpit to groin) 3-4x down your side.  Draw an imaginary diagonal line from upper outer breast through the nipple area toward lower inner breast.  Direct fluid above this line upward and inward toward the pathway across your chest (established in #5).  Then direct the fluid below this line down and out toward the pathway down your side going towards the left groin (established in #6). Do this for a few minutes or until you feel any improvements.   Then repeat your pathways 2-3x  (#5 and #6)  End with repeating #3 and #4 above  (circles in both armpits and the left groin)   Over Head Pull: Narrow and Wide Grip   Cancer Rehab [phone]   On back, knees bent, feet flat, band across thighs, elbows straight but relaxed. Pull hands apart (start). Keeping  elbows straight, bring arms up and over head, hands toward floor. Keep pull steady on band. Hold momentarily. Return slowly, keeping pull steady, back to start. Then do same with a wider grip on the band (past shoulder width) Repeat _5-10__ times. Band color __yellow____   Side Pull: Double Arm   On back, knees bent, feet flat. Arms perpendicular to body, shoulder level, elbows straight but relaxed. Pull arms out to sides, elbows straight. Resistance band comes across collarbones, hands toward floor. Hold momentarily. Slowly return to starting position. Repeat _5-10__ times. Band color _yellow____   Sword   On back, knees bent, feet flat, left hand on left hip, right hand above left. Pull right arm DIAGONALLY (hip to shoulder) across chest. Bring right arm along head toward floor. Hold momentarily. Slowly return to starting position. Repeat _5-10__ times. Do with left arm. Band color _yellow_____   Shoulder Rotation: Double Arm   On back, knees bent, feet flat, elbows tucked at sides, bent 90, hands palms up. Pull hands apart and down toward floor, keeping elbows near sides. Hold momentarily. Slowly return to starting position. Repeat _5-10__ times. Band color __yellow____

## 2023-11-24 ENCOUNTER — Ambulatory Visit: Attending: Hematology and Oncology

## 2023-11-24 DIAGNOSIS — M6281 Muscle weakness (generalized): Secondary | ICD-10-CM | POA: Diagnosis not present

## 2023-11-24 DIAGNOSIS — I890 Lymphedema, not elsewhere classified: Secondary | ICD-10-CM

## 2023-11-24 DIAGNOSIS — M25511 Pain in right shoulder: Secondary | ICD-10-CM | POA: Insufficient documentation

## 2023-11-24 DIAGNOSIS — G8929 Other chronic pain: Secondary | ICD-10-CM | POA: Diagnosis not present

## 2023-11-24 DIAGNOSIS — M25611 Stiffness of right shoulder, not elsewhere classified: Secondary | ICD-10-CM

## 2023-11-24 DIAGNOSIS — C50411 Malignant neoplasm of upper-outer quadrant of right female breast: Secondary | ICD-10-CM | POA: Diagnosis not present

## 2023-11-24 DIAGNOSIS — R293 Abnormal posture: Secondary | ICD-10-CM | POA: Diagnosis not present

## 2023-11-24 DIAGNOSIS — Z170 Estrogen receptor positive status [ER+]: Secondary | ICD-10-CM | POA: Insufficient documentation

## 2023-11-24 NOTE — Therapy (Signed)
 OUTPATIENT PHYSICAL THERAPY  UPPER EXTREMITY ONCOLOGY TREATMENT  Patient Name: Kelly Rowe MRN: [id number] DOB:1970/07/30, 53 y.o., female Today's Date: 11/24/2023  END OF SESSION:  PT End of Session - 11/24/23 1215     Visit Number 10    Number of Visits 12    Date for PT Re-Evaluation 11/23/23   D/C this visit   Authorization Type 8 visits 09/20/23-10/18/23; new auth submitted 10/26/23; 4/2 - 11/30/23    Authorization - Visit Number 5    Authorization - Number of Visits 8    PT Start Time 1213   pt arrived late   PT Stop Time 1251    PT Time Calculation (min) 38 min    Activity Tolerance Patient tolerated treatment well    Behavior During Therapy [facility] for tasks assessed/performed              Past Medical History:  Diagnosis Date   ANEMIA-IRON DEFICIENCY 01/30/2010   ANXIETY 11/27/2007   Arthritis    Asthma 02/25/2011   Cancer (HCC)    breast   Carbuncle and furuncle of trunk 04/16/2010   Chlamydia infection 03/21/2008   DIABETES MELLITUS, TYPE II 08/02/2007   Edema 01/30/2010   ELEVATED BLOOD PRESSURE WITHOUT DIAGNOSIS OF HYPERTENSION 08/02/2007   Family history of breast cancer    Family history of colon cancer    Family history of lung cancer    FREQUENCY, URINARY 05/19/2009   GENITAL HERPES 03/12/2009   GERD (gastroesophageal reflux disease)    History of kidney stones    History of radiation therapy 07/27/18- 09/06/18   Right Breast, Right Axillary and Supraclavicular nodes 50 Gy in 25 fractions, Right Breast Boost 10 Gy in 5 fractions   HIV (human immunodeficiency virus infection) (HCC) 2009   HIV INFECTION 03/12/2009   HSV (herpes simplex virus) infection    HYPERLIPIDEMIA 08/02/2007   Hypertension    Metrorrhagia 03/21/2008   PONV (postoperative nausea and vomiting)    woke up crying per patient    RETENTION, URINE 05/19/2009   Sleep apnea    Trichomonas infection 01/19/2010   VITAMIN D  DEFICIENCY 01/30/2010   Past Surgical History:  Procedure Laterality Date    ABDOMINAL HYSTERECTOMY  07/22/2010   TAH WITH PRESERVATION OF BOTH TUBES AND OVARIES   BREAST LUMPECTOMY Right 2019   BREAST LUMPECTOMY WITH RADIOACTIVE SEED AND SENTINEL LYMPH NODE BIOPSY Right 02/23/2018   Procedure: BREAST LUMPECTOMY WITH RADIOACTIVE SEED AND SENTINEL LYMPH NODE BIOPSY;  Surgeon: Lockie Rima, MD;  Location: [facility] OR;  Service: General;  Laterality: Right;   CHOLECYSTECTOMY     ENDOMETRIAL ABLATION  01/11/2008   HER OPTION   ESOPHAGOGASTRODUODENOSCOPY     MULTIPLE TOOTH EXTRACTIONS     PORT-A-CATH REMOVAL Left 05/08/2019   Procedure: REMOVAL PORT-A-CATH;  Surgeon: Lockie Rima, MD;  Location: [facility];  Service: General;  Laterality: Left;   PORTACATH PLACEMENT Left 02/23/2018   Procedure: INSERTION PORT-A-CATH;  Surgeon: Lockie Rima, MD;  Location: [facility] OR;  Service: General;  Laterality: Left;   PORTACATH PLACEMENT N/A 04/21/2018   Procedure: PORT-A-CATH REVISION;  Surgeon: Lockie Rima, MD;  Location: [facility];  Service: General;  Laterality: N/A;   TUBAL LIGATION     WISDOM TOOTH EXTRACTION     Patient Active Problem List   Diagnosis Date Noted   Blurred vision, bilateral 06/17/2023   Upper abdominal pain 06/17/2023   Travel advice encounter 11/07/2021   Bilateral knee pain 10/18/2020   Leukopenia 08/19/2020  Osteoarthritis of left knee 11/29/2019   Lymphedema of right arm 10/11/2019   COVID-19 virus infection 07/02/2019   Type 2 diabetes mellitus with other specified complication (HCC) 12/13/2018   Hematochezia 03/30/2018   Insomnia 03/30/2018   Port-A-Cath in place 03/29/2018   Genetic testing 01/30/2018   Neoplasm of breast, regional lymph node staging category N3b: metastasis in ipsilateral internal mammary lymph node and axillary lymph node (HCC) 01/30/2018   Family history of breast cancer    Family history of colon cancer    Family history of lung cancer    Morbid obesity (HCC) 01/18/2018   Malignant neoplasm of upper-outer quadrant of  right breast in female, 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   Achilles tendinitis 11/28/2017   Hot flashes 09/30/2017   Hypertension 09/08/2017   Contusion of left knee and lower leg 11/18/2015   Mild intermittent asthma 08/15/2013   BV (bacterial vaginosis) 08/11/2012   Right knee pain 07/05/2012   GERD (gastroesophageal reflux disease) 07/05/2012   GC (gonococcus) 05/30/2012   Chlamydia 05/30/2012   Left knee pain 01/12/2012   Encounter for long-term (current) use of high-risk medication 02/25/2011   Encounter for well adult exam with abnormal findings 02/14/2011   Vitamin D  deficiency 01/30/2010   ANEMIA-IRON DEFICIENCY 01/30/2010   Edema 01/30/2010   HIV (human immunodeficiency virus infection) (HCC) 03/12/2009   GENITAL HERPES 03/12/2009   Depression with anxiety 11/27/2007   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, without long-term current use of insulin  (HCC) 08/02/2007   Hyperlipidemia 08/02/2007    PCP: Rosalia Colonel, MD  REFERRING PROVIDER: Murleen Arms, MD  REFERRING DIAG: C50.411,Z17.0 (ICD-10-CM) - Malignant neoplasm of upper-outer quadrant of right breast in female, estrogen receptor positive (HCC) C50.911,C77.3 (ICD-10-CM) - Neoplasm of right breast, regional lymph node staging category N3b: metastasis in ipsilateral internal mammary lymph node and axillary lymph node (HCC)  THERAPY DIAG:  Stiffness of right shoulder, not elsewhere classified  Chronic right shoulder pain  Muscle weakness (generalized)  Lymphedema, not elsewhere classified  Abnormal posture  Malignant neoplasm of upper-outer quadrant of right breast in female, estrogen receptor positive (HCC)  ONSET DATE: 08/25/23  Rationale for Evaluation and Treatment: Rehabilitation  SUBJECTIVE:                                                                                                                                                                                            SUBJECTIVE STATEMENT:  My breast feels wonderful. My arm is also feeling better. I have been doing all the exercises and massage.   EVAL: I have the  FlexiTouch. I use it 3x/wk. 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 So I started back working Jan 13th. I drive four hours. I am doing all my driving with the R hand. I have a sleeve and I wear it sometimes. I have to pull the night time garment off at night because of the hot flashes.   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 Chemotherapy completed 07/05/18, Herceptin  started with chemo and continues through August of this year. Radiation 07/27/18-09/06/18 to the Rt breast, taking tamoxifen ,  Other medical history to include asthma, DM, HTN, HIV, and hysterectomy  PAIN:  Are you having pain? No  PRECAUTIONS:  DM, HTN, HIV  RED FLAGS: None   WEIGHT BEARING RESTRICTIONS: No  FALLS:  Has patient fallen in last 6 months? 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  COGNITION: Overall cognitive status: Within functional limits for tasks assessed   PALPATION: Increased scar tissue in R axilla at scar line and in R lateral trunk, increased fibrosis to inner medial breast  OBSERVATIONS / OTHER ASSESSMENTS: R breast with increased pore size  POSTURE: forward head and rounded shoulders  UPPER EXTREMITY AROM/PROM:  A/PROM RIGHT   eval   RIGHT 10/06/23 Right 10/26/23 Right  Shoulder extension 60     Shoulder flexion 150 155 143 153  Shoulder abduction 131 155 141 164  Shoulder internal rotation 40     Shoulder external rotation 80       (Blank rows = not tested)  A/PROM LEFT   eval  Shoulder extension 56  Shoulder flexion 177  Shoulder abduction 170  Shoulder internal rotation 51  Shoulder external rotation 90    (Blank rows = not tested)   GRIP STRENGTH: R: 36 lbs, 26 lbs, 16 lbs - AVG: 26 lbs - Norm is 65 lbs           L:  21 lbs, 25 lbs, 22 lbs - AVG: 22 lbs - Norm is 57 lbs   LYMPHEDEMA ASSESSMENTS:   LANDMARK RIGHT  eval  At axilla  42  15 cm proximal to olecranon process 40.5  10 cm proximal to olecranon process 35.5  Olecranon process 27.5  15 cm proximal to ulnar styloid process 28.5  10 cm proximal to ulnar styloid process 24.2  Just proximal to ulnar styloid process 17  Across hand at thumb web space 20.2  At base of 2nd digit 6.1  (Blank rows = not tested)  LANDMARK LEFT  eval  At axilla  43  15 cm proximal to olecranon process 39.9  10 cm proximal to olecranon process 35  Olecranon process 27.5  15 cm proximal to ulnar styloid process 28.6  10 cm proximal to ulnar styloid process 23.6  Just proximal to ulnar styloid process 17.5  Across hand at thumb web space 19.1  At base of 2nd digit 6  (Blank rows = not tested)   LLIS: Flowsheet Row Outpatient  Rehab from 09/20/2023 in [facility]  Lymphedema Life Impact Scale Total Score 64.71 %                                                                                                                                TREATMENT DATE:  Pt permission and consent throughout each step of examination and treatment with modification and draping if requested when working on sensitive areas 11/24/23: Self Care Assessed pts goals and discussed current functional status for D/C. Also reviewed importance of staying consistent  with self MLD, wear of compression and maintaining compliance with HEP.  Manual Therapy In supine: Short neck, superficial and deep abdominals, Lt axillary nodes, anterior intact thorax sequence and establishment of anterior inter-axillary pathway, Rt inguinal nodes and establishment of Rt axillo-inguinal pathway, then Rt superior and medial breast moving fluid towards anterior pathway, then finished retracing all steps.  PROM of the Rt shoulder into flexion, abduction, and D2 with scapular depression throughout by therapist Scar tissue work to the lumpectomy incision  11/22/23: Manual Therapy In supine: Short neck, 5 diaphragmatic breaths, Lt axillary nodes and establishment of anterior inter-axillary pathway, Rt inguinal nodes and establishment of Rt axillo-inguinal pathway, then Rt superior and medial breast moving fluid towards anterior pathway, then finished retracing all steps.   11/16/23: Manual Therapy In supine: Short neck, superficial and deep abdominals, Lt axillary nodes, anterior intact thorax sequence and establishment of anterior inter-axillary pathway, Rt inguinal nodes and establishment of Rt axillo-inguinal pathway, then Rt superior and medial breast moving fluid towards anterior pathway, then finished retracing all steps.  PROM of the Rt shoulder into flexion, abduction, and D2 with scapular depression throughout by therapist Scar tissue work to the lumpectomy incision, lateral trunk tightness       PATIENT EDUCATION:  Education details:Supine scapular series with yellow theraband Person educated: Patient Education method: Explanation, demonstration, tactile and VC's, and handout issued Education comprehension: verbalized understanding, returned demo, will benefit from further review  HOME EXERCISE PROGRAM: Try compression bra at night 11/14/23 - Supine scapular series   ASSESSMENT:  CLINICAL IMPRESSION: Overall pt is much improved and has met all but one of her  goals. Her flex has improved but is still some limited. Pt reports not having any pain though any longer with end ROM or in her breast. Pt is ready for D/C at this time.   OBJECTIVE IMPAIRMENTS: decreased knowledge of condition, decreased ROM, decreased strength, increased edema, increased fascial restrictions, increased muscle spasms, impaired UE functional use, postural dysfunction, and pain.   ACTIVITY LIMITATIONS: carrying, lifting, and reach over head  PARTICIPATION LIMITATIONS: meal prep, cleaning, laundry, driving, shopping, community activity, and occupation  PERSONAL FACTORS: Fitness and Time since onset of injury/illness/exacerbation are also affecting patient's functional outcome.   REHAB POTENTIAL: Good  CLINICAL DECISION MAKING: Stable/uncomplicated  EVALUATION COMPLEXITY: Moderate  GOALS: Goals reviewed with patient? Yes  SHORT TERM GOALS=LONG TERM GOALS Target  date: 11/23/23  Pt will demonstrate 170 degrees of R shoulder flexion to allow her to reach overhead. Baseline: 150 degrees; 10/26/23 - 143; 11/24/23 - 153 degrees Goal status: UNMET  2.  Pt will demonstrate 160 degrees of R shoulder abduction to allow her to reach out to the side.  Baseline:  130 degrees; 10/26/23 - 141; 11/24/23 - 164 degrees Goal status: MET  3.  Pt will report a 50% improvement in pain in R chest, shoulder and upper arm to allow improved comfort. Baseline: 10/26/23 - Did not rate today but pt reports Rt shoulder pain is much improved, but breast still has shooting intermittent pains; 11/24/23 - 98% improvement Goal status: MET  4.  Pt will improve grip strength to 50 lbs on R to allow improved function and increase ability to open jars independently.  Baseline: 11/24/23 - 52 lbs Goal status: MET  5.  Pt will be independent in self MLD of R breast for long term management.  Baseline:  Goal status: MET  6.  Pt will be independent in a home exercise program for continued stretching and strengthening.   Baseline:  Goal status: MET   PLAN:  PT FREQUENCY: 2x/week  PT DURATION: 4 weeks  PLANNED INTERVENTIONS: 97164- PT Re-evaluation, 97110-Therapeutic exercises, 97530- Therapeutic activity, 97112- Neuromuscular re-education, 97535- Self Care, 16109- Manual therapy, [phone]- Orthotic Fit/training, Patient/Family education, Balance training, Joint mobilization, Manual lymph drainage, Scar mobilization, Therapeutic exercises, Therapeutic activity, Neuromuscular re-education, Gait training, and Self Care  PLAN FOR NEXT SESSION: D/C this visit.   Denyce Flank, PTA 11/24/2023, 12:58 PM  Manual Lymph Drainage for Right Breast.   Do daily.  Do slowly. Use flat hands with just enough pressure to stretch the skin. Do not slide over the skin, stretch the skin with the hand. (Stretch  Relax  Move) Lie down or sit comfortably (in a recliner, for example) to do this.   Do circles at each collar bone near the neck 5-7 times (to "wake up" lots of lymph nodes in this area).  Take slow deep breaths, allowing your belly to balloon out as you breathe in, 5x (to "wake up" abdominal lymph nodes).  Do circles in both armpits--stretch skin in small circles to stimulate intact lymph nodes there, 5-7x.  Do Circles in the right groin area, at panty line--stretch skin to stimulate lymph nodes 5-7x.  Redirect fluid from right chest toward left armpit (stretch skin starting at right chest in 3-4 spots working toward left armpit) 3-4x across the chest.  Redirect fluid from right armpit toward right groin (cup your hand around the curve of your right side and do 3-4 "pumps" from armpit to groin) 3-4x down your side.  Draw an imaginary diagonal line from upper outer breast through the nipple area toward lower inner breast.  Direct fluid above this line upward and inward toward the pathway across your chest (established in #5).  Then direct the fluid below this line down and out toward the pathway down  your side going towards the left groin (established in #6). Do this for a few minutes or until you feel any improvements.   Then repeat your pathways 2-3x  (#5 and #6)  End with repeating #3 and #4 above  (circles in both armpits and the left groin)   Over Head Pull: Narrow and Wide Grip   Cancer Rehab [phone]   On back, knees bent, feet flat, band across thighs, elbows straight but relaxed. Pull hands apart (start). Keeping  elbows straight, bring arms up and over head, hands toward floor. Keep pull steady on band. Hold momentarily. Return slowly, keeping pull steady, back to start. Then do same with a wider grip on the band (past shoulder width) Repeat _5-10__ times. Band color __yellow____   Side Pull: Double Arm   On back, knees bent, feet flat. Arms perpendicular to body, shoulder level, elbows straight but relaxed. Pull arms out to sides, elbows straight. Resistance band comes across collarbones, hands toward floor. Hold momentarily. Slowly return to starting position. Repeat _5-10__ times. Band color _yellow____   Sword   On back, knees bent, feet flat, left hand on left hip, right hand above left. Pull right arm DIAGONALLY (hip to shoulder) across chest. Bring right arm along head toward floor. Hold momentarily. Slowly return to starting position. Repeat _5-10__ times. Do with left arm. Band color _yellow_____   Shoulder Rotation: Double Arm   On back, knees bent, feet flat, elbows tucked at sides, bent 90, hands palms up. Pull hands apart and down toward floor, keeping elbows near sides. Hold momentarily. Slowly return to starting position. Repeat _5-10__ times. Band color __yellow____   PHYSICAL THERAPY DISCHARGE SUMMARY  Visits from Start of Care: 10  Current functional level related to goals / functional outcomes: See above   Remaining deficits: See above   Education / Equipment: HEP, lymphedema   Patient agrees to discharge. Patient goals were partially met.  Patient is being discharged due to meeting the stated rehab goals.  Evelyn Hire Brewster, [place] 11/29/23 10:29 AM

## 2023-12-01 ENCOUNTER — Ambulatory Visit

## 2023-12-01 VITALS — Ht 62.0 in | Wt 233.0 lb

## 2023-12-01 DIAGNOSIS — Z0000 Encounter for general adult medical examination without abnormal findings: Secondary | ICD-10-CM

## 2023-12-01 NOTE — Patient Instructions (Addendum)
 Kelly Rowe , Thank you for taking time to come for your Medicare Wellness Visit. I appreciate your ongoing commitment to your health goals. Please review the following plan we discussed and let me know if I can assist you in the future.   Referrals/Orders/Follow-Ups/Clinician Recommendations: Aim for 30 minutes of exercise or brisk walking, 6-8 glasses of water, and 5 servings of fruits and vegetables each day. Educated and advised on getting the Pneumonia vaccine in 2025.  PCP's office will request 2024 pap smear from Gynecologist.    This is a list of the screening recommended for you and due dates:  Health Maintenance  Topic Date Due   Pneumococcal Vaccination (4 of 4 - PCV20 or PCV21) 05/11/2019   Pap Smear  08/18/2023   Hemoglobin A1C  12/15/2023   Complete foot exam   01/20/2024   Flu Shot  02/24/2024   Colon Cancer Screening  06/13/2024   Yearly kidney function blood test for diabetes  06/16/2024   Yearly kidney health urinalysis for diabetes  06/16/2024   Mammogram  07/14/2024   Eye exam for diabetics  09/29/2024   Medicare Annual Wellness Visit  11/30/2024   DTaP/Tdap/Td vaccine (6 - Td or Tdap) 12/05/2032   Hepatitis C Screening  Completed   HIV Screening  Completed   Zoster (Shingles) Vaccine  Completed   HPV Vaccine  Aged Out   Meningitis B Vaccine  Aged Out   COVID-19 Vaccine  Discontinued    Advanced directives: (Provided) Advance directive discussed with you today. I have provided a copy for you to complete at home and have notarized. Once this is complete, please bring a copy in to our office so we can scan it into your chart.   Next Medicare Annual Wellness Visit scheduled for next year: Yes  Have you seen your provider in the last 6 months (3 months if uncontrolled diabetes)? No- scheduled an appointment for 12/16/2023

## 2023-12-01 NOTE — Progress Notes (Signed)
 Subjective:   Kelly Rowe is a 53 y.o. who presents for a Medicare Wellness preventive visit.  Visit Complete: Virtual I connected with  Solon Dura on 12/01/23 by a audio enabled telemedicine application and verified that I am speaking with the correct person using two identifiers.  Patient Location: Home  Provider Location: Office/Clinic  I discussed the limitations of evaluation and management by telemedicine. The patient expressed understanding and agreed to proceed.  Vital Signs: Because this visit was a virtual/telehealth visit, some criteria may be missing or patient reported. Any vitals not documented were not able to be obtained and vitals that have been documented are patient reported.  VideoDeclined- This patient declined Librarian, academic. Therefore the visit was completed with audio only.  Persons Participating in Visit: Patient.  AWV Questionnaire: No: Patient Medicare AWV questionnaire was not completed prior to this visit.  Cardiac Risk Factors include: advanced age (>87men, >75 women);diabetes mellitus;dyslipidemia;hypertension     Objective:    Today's Vitals   12/01/23 1146  Weight: 233 lb (105.7 kg)  Height: 5\' 2"  (1.575 m)   Body mass index is 42.62 kg/m.     12/01/2023   11:44 AM 09/20/2023   10:12 AM 06/18/2020   10:30 AM 05/14/2020    3:24 PM 04/16/2020   11:41 AM 11/19/2019   11:38 AM 05/08/2019    9:33 AM  Advanced Directives  Does Patient Have a Medical Advance Directive? No No No No No No No  Would patient like information on creating a medical advance directive? Yes (MAU/Ambulatory/Procedural Areas - Information given) No - Patient declined No - Patient declined Yes (MAU/Ambulatory/Procedural Areas - Information given) No - Patient declined  No - Patient declined    Current Medications (verified) Outpatient Encounter Medications as of 12/01/2023  Medication Sig   albuterol  (VENTOLIN  HFA) 108  (90 Base) MCG/ACT inhaler Inhale 2 puffs into the lungs every 6 (six) hours as needed for wheezing or shortness of breath.   ALPRAZolam (XANAX) 0.25 MG tablet Take by mouth.   Beclomethasone Dipropionate  80 MCG/ACT AERS Place into the nose.   bictegravir-emtricitabine-tenofovir AF (BIKTARVY) 50-200-25 MG TABS tablet Take 1 tablet by mouth daily.   blood glucose meter kit and supplies Dispense based on patient and insurance preference. Use up to four times daily as directed. (FOR ICD-10 E10.9, E11.9).   carvedilol  (COREG ) 6.25 MG tablet TAKE 1 TABLET(6.25 MG) BY MOUTH TWICE DAILY   Cholecalciferol (THERA-D 2000) 50 MCG (2000 UT) TABS 1 tab by mouth once daily   Diclofenac  Sodium (PENNSAID ) 2 % SOLN Place 1 application onto the skin 2 (two) times daily.   Doxepin  HCl 3 MG TABS Take 1 tablet (3 mg total) by mouth at bedtime.   Ferrous Fumarate (HEMOCYTE - 106 MG FE) 324 (106 Fe) MG TABS tablet Take by mouth.   ferrous gluconate (FERGON) 240 (27 FE) MG tablet Take 240 mg by mouth daily as needed (iron).    Fezolinetant  (VEOZAH ) 45 MG TABS Take 1 tablet (45 mg total) by mouth daily.   FLUoxetine  (PROZAC ) 20 MG capsule Take 1 capsule by mouth once daily   gabapentin  (NEURONTIN ) 300 MG capsule Take 1 capsule (300 mg total) by mouth at bedtime.   glipiZIDE  (GLUCOTROL  XL) 10 MG 24 hr tablet TAKE 1 TABLET(10 MG) BY MOUTH DAILY WITH BREAKFAST   glucose blood (ONETOUCH VERIO) test strip USE 1 STRIP TO CHECK GLUCOSE 4 TIMES DAILY AS DIRECTED. DX: E11.69   hydrochlorothiazide (MICROZIDE)  12.5 MG capsule Take 1 capsule by mouth daily.   hydrocortisone  (ANUSOL -HC) 2.5 % rectal cream Place 1 application rectally 2 (two) times daily.   ibuprofen  (ADVIL ,MOTRIN ) 800 MG tablet Take 1 tablet (800 mg total) by mouth every 8 (eight) hours as needed.   Insulin  Pen Needle (PEN NEEDLES) 32G X 4 MM MISC Use with insulin  pen   Lancets (ONETOUCH DELICA PLUS LANCET33G) MISC USE TO TEST BLOOD SUGAR UP TO 4 TIMES DAILY    linaclotide  (LINZESS ) 145 MCG CAPS capsule Take 1 capsule (145 mcg total) by mouth daily before breakfast.   loratadine (CLARITIN) 10 MG tablet Take 10 mg by mouth daily.   losartan  (COZAAR ) 25 MG tablet Take 1 tablet (25 mg total) by mouth daily.   Multiple Vitamin (MULTIVITAMIN WITH MINERALS) TABS tablet Take 1 tablet by mouth daily.   nitroGLYCERIN  (NITRODUR - DOSED IN MG/24 HR) 0.2 mg/hr patch Apply 1/4 patch daily to tendon for tendonitis.   nystatin -triamcinolone  ointment (MYCOLOG) Apply 1 Application topically 2 (two) times daily.   pioglitazone  (ACTOS ) 45 MG tablet Take 1 tablet (45 mg total) by mouth daily.   prochlorperazine  (COMPAZINE ) 10 MG tablet Take by mouth.   rosuvastatin  (CRESTOR ) 40 MG tablet Take 1 tablet (40 mg total) by mouth daily.   scopolamine  (TRANSDERM-SCOP) 1 MG/3DAYS Place 1 patch (1.5 mg total) onto the skin every 3 (three) days.   Suvorexant  (BELSOMRA ) 15 MG TABS Take 1 tablet by mouth at bedtime as needed.   tamoxifen  (NOLVADEX ) 20 MG tablet Take 1 tablet by mouth once daily   tirzepatide  (MOUNJARO ) 10 MG/0.5ML Pen Inject 10 mg into the skin once a week.   triamcinolone  (NASACORT ) 55 MCG/ACT AERO nasal inhaler Place 2 sprays into the nose daily.   valACYclovir  (VALTREX ) 500 MG tablet Take twice daily for 3-5 days   zolpidem  (AMBIEN ) 10 MG tablet TAKE 1 & 1/2 (ONE & ONE-HALF) TABLETS BY MOUTH AT BEDTIME AS NEEDED FOR SLEEP   [DISCONTINUED] HYDROcodone  bit-homatropine (HYCODAN) 5-1.5 MG/5ML syrup Take 5 mLs by mouth every 8 (eight) hours as needed for cough.   [DISCONTINUED] zolpidem  (AMBIEN ) 10 MG tablet Take by mouth.   No facility-administered encounter medications on file as of 12/01/2023.    Allergies (verified) Metformin  and related, Levaquin  [levofloxacin  in d5w], and Penicillins   History: Past Medical History:  Diagnosis Date   ANEMIA-IRON DEFICIENCY 01/30/2010   ANXIETY 11/27/2007   Arthritis    Asthma 02/25/2011   Cancer (HCC)    breast   Carbuncle  and furuncle of trunk 04/16/2010   Chlamydia infection 03/21/2008   DIABETES MELLITUS, TYPE II 08/02/2007   Edema 01/30/2010   ELEVATED BLOOD PRESSURE WITHOUT DIAGNOSIS OF HYPERTENSION 08/02/2007   Family history of breast cancer    Family history of colon cancer    Family history of lung cancer    FREQUENCY, URINARY 05/19/2009   GENITAL HERPES 03/12/2009   GERD (gastroesophageal reflux disease)    History of kidney stones    History of radiation therapy 07/27/18- 09/06/18   Right Breast, Right Axillary and Supraclavicular nodes 50 Gy in 25 fractions, Right Breast Boost 10 Gy in 5 fractions   HIV (human immunodeficiency virus infection) (HCC) 2009   HIV INFECTION 03/12/2009   HSV (herpes simplex virus) infection    HYPERLIPIDEMIA 08/02/2007   Hypertension    Metrorrhagia 03/21/2008   PONV (postoperative nausea and vomiting)    woke up crying per patient    RETENTION, URINE 05/19/2009   Sleep apnea  Trichomonas infection 01/19/2010   VITAMIN D  DEFICIENCY 01/30/2010   Past Surgical History:  Procedure Laterality Date   ABDOMINAL HYSTERECTOMY  07/22/2010   TAH WITH PRESERVATION OF BOTH TUBES AND OVARIES   BREAST LUMPECTOMY Right 2019   BREAST LUMPECTOMY WITH RADIOACTIVE SEED AND SENTINEL LYMPH NODE BIOPSY Right 02/23/2018   Procedure: BREAST LUMPECTOMY WITH RADIOACTIVE SEED AND SENTINEL LYMPH NODE BIOPSY;  Surgeon: Lockie Rima, MD;  Location: [facility];  Service: General;  Laterality: Right;   CHOLECYSTECTOMY     ENDOMETRIAL ABLATION  01/11/2008   HER OPTION   ESOPHAGOGASTRODUODENOSCOPY     MULTIPLE TOOTH EXTRACTIONS     PORT-A-CATH REMOVAL Left 05/08/2019   Procedure: REMOVAL PORT-A-CATH;  Surgeon: Lockie Rima, MD;  Location: [facility];  Service: General;  Laterality: Left;   PORTACATH PLACEMENT Left 02/23/2018   Procedure: INSERTION PORT-A-CATH;  Surgeon: Lockie Rima, MD;  Location: [facility] OR;  Service: General;  Laterality: Left;   PORTACATH PLACEMENT N/A 04/21/2018   Procedure:  PORT-A-CATH REVISION;  Surgeon: Lockie Rima, MD;  Location: [facility];  Service: General;  Laterality: N/A;   TUBAL LIGATION     WISDOM TOOTH EXTRACTION     Family History  Problem Relation Age of Onset   Diabetes Mother    Hypertension Mother    Clotting disorder Mother    Diabetes Father    Cancer Father        COLON and LU NG   Colon cancer Father    Breast cancer Paternal Aunt    Lung cancer Paternal Aunt    Breast cancer Paternal Aunt    Breast cancer Paternal Aunt    Stroke Paternal Uncle    Lung cancer Maternal Grandmother 45       Mesothelioma   Heart attack Maternal Grandfather 69   Colon cancer Paternal Grandfather        dx over 23s   Prostate cancer Other    Heart disease Other    Stroke Other    Colon cancer Other        MGM's 5 brothers   Social History   Socioeconomic History   Marital status: Divorced    Spouse name: Not on file   Number of children: Not on file   Years of education: Not on file   Highest education level: Not on file  Occupational History   Not on file  Tobacco Use   Smoking status: Never    Passive exposure: Current   Smokeless tobacco: Never  Vaping Use   Vaping status: Never Used  Substance and Sexual Activity   Alcohol use: Yes    Alcohol/week: 0.0 standard drinks of alcohol    Comment: occ   Drug use: No   Sexual activity: Not Currently    Partners: Male    Birth control/protection: Surgical    Comment: hysterectomy, menarche 53yo, sexual debut 53yo, High Risk Medicare (Hx HIV)  Other Topics Concern   Not on file  Social History Narrative   Not on file   Social Drivers of Health   Financial Resource Strain: Medium Risk (12/01/2023)   Overall Financial Resource Strain (CARDIA)    Difficulty of Paying Living Expenses: Somewhat hard  Food Insecurity: No Food Insecurity (12/01/2023)   Hunger Vital Sign    Worried About Running Out of Food in the Last Year: Never true    Ran Out of Food in the Last Year: Never true   Transportation Needs: No Transportation Needs (12/01/2023)   PRAPARE -  Administrator, Civil Service (Medical): No    Lack of Transportation (Non-Medical): No  Physical Activity: Insufficiently Active (12/01/2023)   Exercise Vital Sign    Days of Exercise per Week: 7 days    Minutes of Exercise per Session: 20 min  Stress: Stress Concern Present (12/01/2023)   [employer] of Occupational Health - Occupational Stress Questionnaire    Feeling of Stress : To some extent  Social Connections: Moderately Integrated (12/01/2023)   Social Connection and Isolation Panel [NHANES]    Frequency of Communication with Friends and Family: More than three times a week    Frequency of Social Gatherings with Friends and Family: More than three times a week    Attends Religious Services: More than 4 times per year    Active Member of [employer] or Organizations: Yes    Attends Engineer, structural: More than 4 times per year    Marital Status: Divorced    Tobacco Counseling Counseling given: No    Clinical Intake:  Pre-visit preparation completed: Yes  Pain : No/denies pain     BMI - recorded: 42.62 Nutritional Status: BMI > 30  Obese Nutritional Risks: None Diabetes: Yes CBG done?: No Did pt. bring in CBG monitor from home?: No  Lab Results  Component Value Date   HGBA1C 8.3 (H) 06/17/2023   HGBA1C 9.7 (H) 01/20/2023   HGBA1C 11.4 (H) 11/06/2021     How often do you need to have someone help you when you read instructions, pamphlets, or other written materials from your doctor or pharmacy?: 1 - Never  Interpreter Needed?: No  Information entered by :: Kandy Orris, CMA   Activities of Daily Living     12/01/2023   11:49 AM  In your present state of health, do you have any difficulty performing the following activities:  Hearing? 0  Vision? 0  Difficulty concentrating or making decisions? 0  Walking or climbing stairs? 0  Dressing or bathing? 0  Doing  errands, shopping? 0  Preparing Food and eating ? N  Using the Toilet? N  In the past six months, have you accidently leaked urine? Y  Comment wears a pantyliner  Do you have problems with loss of bowel control? N  Managing your Medications? N  Managing your Finances? N  Housekeeping or managing your Housekeeping? N    Patient Care Team: Roslyn Coombe, MD as PCP - Norberto Bear, MD as Consulting Physician (General Surgery) Colie Dawes, MD as Attending Physician (Radiation Oncology) Kym Phoenix, PA-C as Referring Physician (Physician Assistant) Syliva Even, MD as Consulting Physician (Family Medicine) Andee Bamberger, NP as Nurse Practitioner (Gynecology) [facility] (Optometry)  Indicate any recent Medical Services you may have received from other than [facility] providers in the past year (date may be approximate).     Assessment:    This is a routine wellness examination for [facility].  Hearing/Vision screen Hearing Screening - Comments:: Denies hearing difficulties   Vision Screening - Comments:: Wears rx glasses - up to date with routine eye exams with Dr Candi Chafe   Goals Addressed               This Visit's Progress     Patient Stated (pt-stated)        Patient stated she wants to lose weight and manage HgB A1C (lose about 10lbs)       Depression Screen     12/01/2023   11:54 AM  01/20/2023    2:22 PM 04/30/2022    2:43 PM 11/06/2021    8:51 AM 10/17/2020    3:36 PM 07/09/2020    3:33 PM 12/13/2018    2:14 PM  PHQ 2/9 Scores  PHQ - 2 Score 1 0  0 1 0 0  PHQ- 9 Score 2 4  0     Exception Documentation   Patient refusal        Fall Risk     12/01/2023   11:50 AM 06/17/2023   10:58 AM 01/20/2023    2:22 PM 08/17/2022    2:20 PM 04/30/2022    2:43 PM  Fall Risk   Falls in the past year? 0 0 0 0 0  Number falls in past yr: 0  0 0   Injury with Fall? 0  0 0   Risk for fall due to : No Fall Risks  No Fall Risks  No Fall Risks   Follow up Falls prevention discussed;Falls evaluation completed  Falls evaluation completed  Falls evaluation completed    MEDICARE RISK AT HOME:  Medicare Risk at Home Any stairs in or around the home?: Yes If so, are there any without handrails?: No Home free of loose throw rugs in walkways, pet beds, electrical cords, etc?: Yes Adequate lighting in your home to reduce risk of falls?: Yes Life alert?: No Use of a cane, walker or w/c?: No Grab bars in the bathroom?: No Shower chair or bench in shower?: No Elevated toilet seat or a handicapped toilet?: No  TIMED UP AND GO:  Was the test performed?  No  Cognitive Function: 6CIT completed        12/01/2023   11:55 AM  6CIT Screen  What Year? 0 points  What month? 0 points  What time? 0 points  Count back from 20 0 points  Months in reverse 0 points  Repeat phrase 0 points  Total Score 0 points    Immunizations Immunization History  Administered Date(s) Administered   Hep A / Hep B 09/05/2008, 10/31/2008, 05/01/2009   Hepatitis A, Adult 02/09/2023   Hepb-cpg 02/09/2023   Influenza Split 05/30/2012, 08/08/2013, 05/10/2014, 03/31/2016   Influenza, Quadrivalent, Recombinant, Inj, Pf 03/31/2018   Influenza,inj,Quad PF,6+ Mos 05/30/2012, 03/31/2016, 04/05/2019, 05/04/2021, 08/02/2022   Influenza,inj,quad, With Preservative 08/08/2013, 05/10/2014   Influenza,trivalent, recombinat, inj, PF 03/31/2018   Influenza-Unspecified 05/30/2012, 03/31/2016   Meningococcal Mcv4o 01/18/2022   Moderna Covid-19 Fall Seasonal Vaccine 88yrs & older 08/02/2022   PFIZER Comirnaty(Gray Top)Covid-19 Tri-Sucrose Vaccine 02/18/2021, 03/08/2021   PPD Test 01/10/2015   Pneumococcal Conjugate-13 08/08/2013   Pneumococcal Polysaccharide-23 05/23/2008, 05/10/2014   Td 03/12/2009, 12/06/2022   Td (Adult),5 Lf Tetanus Toxid, Preservative Free 03/12/2009   Tdap 11/02/2010, 04/05/2019   Zoster Recombinant(Shingrix) 08/30/2022, 11/01/2022     Screening Tests Health Maintenance  Topic Date Due   Pneumococcal Vaccine 72-17 Years old (4 of 4 - PCV20 or PCV21) 05/11/2019   Cervical Cancer Screening (Pap smear)  08/18/2023   HEMOGLOBIN A1C  12/15/2023   FOOT EXAM  01/20/2024   INFLUENZA VACCINE  02/24/2024   Colonoscopy  06/13/2024   Diabetic kidney evaluation - eGFR measurement  06/16/2024   Diabetic kidney evaluation - Urine ACR  06/16/2024   MAMMOGRAM  07/14/2024   OPHTHALMOLOGY EXAM  09/29/2024   Medicare Annual Wellness (AWV)  11/30/2024   DTaP/Tdap/Td (6 - Td or Tdap) 12/05/2032   Hepatitis C Screening  Completed   HIV Screening  Completed  Zoster Vaccines- Shingrix  Completed   HPV VACCINES  Aged Out   Meningococcal B Vaccine  Aged Out   COVID-19 Vaccine  Discontinued    Health Maintenance  Health Maintenance Due  Topic Date Due   Pneumococcal Vaccine 74-83 Years old (4 of 4 - PCV20 or PCV21) 05/11/2019   Cervical Cancer Screening (Pap smear)  08/18/2023   Health Maintenance Items Addressed:   Pap Smear status: Requested a copy of report from Gynecologist.   Additional Screening:  Vision Screening: Recommended annual ophthalmology exams for early detection of glaucoma and other disorders of the eye.  Dental Screening: Recommended annual dental exams for proper oral hygiene  Community Resource Referral / Chronic Care Management: CRR required this visit?  No   CCM required this visit?  No     Plan:     I have personally reviewed and noted the following in the patient's chart:   Medical and social history Use of alcohol, tobacco or illicit drugs  Current medications and supplements including opioid prescriptions. Patient is not currently taking opioid prescriptions. Functional ability and status Nutritional status Physical activity Advanced directives List of other physicians Hospitalizations, surgeries, and ER visits in previous 12 months Vitals Screenings to include cognitive,  depression, and falls Referrals and appointments  In addition, I have reviewed and discussed with patient certain preventive protocols, quality metrics, and best practice recommendations. A written personalized care plan for preventive services as well as general preventive health recommendations were provided to patient.     Patria Bookbinder, CMA   12/01/2023   After Visit Summary: (MyChart) Due to this being a telephonic visit, the after visit summary with patients personalized plan was offered to patient via MyChart   Notes: Nothing significant to report at this time.

## 2023-12-16 ENCOUNTER — Ambulatory Visit (INDEPENDENT_AMBULATORY_CARE_PROVIDER_SITE_OTHER): Admitting: Internal Medicine

## 2023-12-16 ENCOUNTER — Encounter: Payer: Self-pay | Admitting: Internal Medicine

## 2023-12-16 ENCOUNTER — Ambulatory Visit: Payer: Self-pay | Admitting: Internal Medicine

## 2023-12-16 ENCOUNTER — Other Ambulatory Visit: Payer: Self-pay | Admitting: Internal Medicine

## 2023-12-16 VITALS — BP 130/76 | HR 100 | Temp 98.8°F | Ht 62.0 in | Wt 234.0 lb

## 2023-12-16 DIAGNOSIS — E1169 Type 2 diabetes mellitus with other specified complication: Secondary | ICD-10-CM

## 2023-12-16 DIAGNOSIS — Z0000 Encounter for general adult medical examination without abnormal findings: Secondary | ICD-10-CM

## 2023-12-16 DIAGNOSIS — F418 Other specified anxiety disorders: Secondary | ICD-10-CM

## 2023-12-16 DIAGNOSIS — Z23 Encounter for immunization: Secondary | ICD-10-CM | POA: Diagnosis not present

## 2023-12-16 DIAGNOSIS — E7800 Pure hypercholesterolemia, unspecified: Secondary | ICD-10-CM

## 2023-12-16 DIAGNOSIS — E538 Deficiency of other specified B group vitamins: Secondary | ICD-10-CM | POA: Diagnosis not present

## 2023-12-16 DIAGNOSIS — Z0001 Encounter for general adult medical examination with abnormal findings: Secondary | ICD-10-CM

## 2023-12-16 DIAGNOSIS — Z7985 Long-term (current) use of injectable non-insulin antidiabetic drugs: Secondary | ICD-10-CM

## 2023-12-16 DIAGNOSIS — Z7984 Long term (current) use of oral hypoglycemic drugs: Secondary | ICD-10-CM

## 2023-12-16 DIAGNOSIS — E559 Vitamin D deficiency, unspecified: Secondary | ICD-10-CM | POA: Diagnosis not present

## 2023-12-16 LAB — URINALYSIS, ROUTINE W REFLEX MICROSCOPIC
Bilirubin Urine: NEGATIVE
Hgb urine dipstick: NEGATIVE
Ketones, ur: NEGATIVE
Leukocytes,Ua: NEGATIVE
Nitrite: NEGATIVE
RBC / HPF: NONE SEEN (ref 0–?)
Specific Gravity, Urine: 1.025 (ref 1.000–1.030)
Total Protein, Urine: NEGATIVE
Urine Glucose: NEGATIVE
Urobilinogen, UA: 0.2 (ref 0.0–1.0)
pH: 5.5 (ref 5.0–8.0)

## 2023-12-16 LAB — BASIC METABOLIC PANEL WITH GFR
BUN: 12 mg/dL (ref 6–23)
CO2: 28 meq/L (ref 19–32)
Calcium: 9.5 mg/dL (ref 8.4–10.5)
Chloride: 103 meq/L (ref 96–112)
Creatinine, Ser: 0.8 mg/dL (ref 0.40–1.20)
GFR: 83.21 mL/min (ref >60.00)
Glucose, Bld: 130 mg/dL — ABNORMAL HIGH (ref 70–99)
Potassium: 4.4 meq/L (ref 3.5–5.1)
Sodium: 139 meq/L (ref 135–145)

## 2023-12-16 LAB — CBC WITH DIFFERENTIAL/PLATELET
Basophils Absolute: 0 10*3/uL (ref 0.0–0.1)
Basophils Relative: 0.4 % (ref 0.0–3.0)
Eosinophils Absolute: 0 10*3/uL (ref 0.0–0.7)
Eosinophils Relative: 0.6 % (ref 0.0–5.0)
HCT: 39.8 % (ref 36.0–46.0)
Hemoglobin: 13 g/dL (ref 12.0–15.0)
Lymphocytes Relative: 53.3 % — ABNORMAL HIGH (ref 12.0–46.0)
Lymphs Abs: 1.8 10*3/uL (ref 0.7–4.0)
MCHC: 32.7 g/dL (ref 30.0–36.0)
MCV: 86 fl (ref 78.0–100.0)
Monocytes Absolute: 0.3 10*3/uL (ref 0.1–1.0)
Monocytes Relative: 8.2 % (ref 3.0–12.0)
Neutro Abs: 1.3 10*3/uL — ABNORMAL LOW (ref 1.4–7.7)
Neutrophils Relative %: 37.5 % — ABNORMAL LOW (ref 43.0–77.0)
Platelets: 225 10*3/uL (ref 150.0–400.0)
RBC: 4.63 Mil/uL (ref 3.87–5.11)
RDW: 13.7 % (ref 11.5–15.5)
WBC: 3.4 10*3/uL — ABNORMAL LOW (ref 4.0–10.5)

## 2023-12-16 LAB — LIPID PANEL
Cholesterol: 205 mg/dL — ABNORMAL HIGH (ref 0–200)
HDL: 57.6 mg/dL (ref >39.00)
LDL Cholesterol: 126 mg/dL — ABNORMAL HIGH (ref 0–99)
NonHDL: 147.3
Total CHOL/HDL Ratio: 4
Triglycerides: 105 mg/dL (ref 0.0–149.0)
VLDL: 21 mg/dL (ref 0.0–40.0)

## 2023-12-16 LAB — HEPATIC FUNCTION PANEL
ALT: 19 U/L (ref 0–35)
AST: 20 U/L (ref 0–37)
Albumin: 4.3 g/dL (ref 3.5–5.2)
Alkaline Phosphatase: 122 U/L — ABNORMAL HIGH (ref 39–117)
Bilirubin, Direct: 0.1 mg/dL (ref 0.0–0.3)
Total Bilirubin: 0.2 mg/dL (ref 0.2–1.2)
Total Protein: 7.4 g/dL (ref 6.0–8.3)

## 2023-12-16 LAB — MICROALBUMIN / CREATININE URINE RATIO
Creatinine,U: 145.9 mg/dL
Microalb Creat Ratio: UNDETERMINED mg/g (ref 0.0–30.0)
Microalb, Ur: <0.7 mg/dL

## 2023-12-16 LAB — VITAMIN B12: Vitamin B-12: 878 pg/mL (ref 211–911)

## 2023-12-16 LAB — VITAMIN D 25 HYDROXY (VIT D DEFICIENCY, FRACTURES): VITD: 21.36 ng/mL — ABNORMAL LOW (ref 30.00–100.00)

## 2023-12-16 LAB — HEMOGLOBIN A1C: Hgb A1c MFr Bld: 7.3 % — ABNORMAL HIGH (ref 4.6–6.5)

## 2023-12-16 LAB — TSH: TSH: 5.39 u[IU]/mL (ref 0.35–5.50)

## 2023-12-16 MED ORDER — GLIPIZIDE ER 10 MG PO TB24: 10.0000 mg | ORAL_TABLET | Freq: Every day | ORAL | 3 refills | Status: AC

## 2023-12-16 MED ORDER — ROSUVASTATIN CALCIUM 20 MG PO TABS: 20.0000 mg | ORAL_TABLET | Freq: Every day | ORAL | 3 refills | Status: DC

## 2023-12-16 MED ORDER — ALBUTEROL SULFATE HFA 108 (90 BASE) MCG/ACT IN AERS: 2.0000 | INHALATION_SPRAY | Freq: Four times a day (QID) | RESPIRATORY_TRACT | 11 refills | Status: AC | PRN

## 2023-12-16 MED ORDER — CARVEDILOL 6.25 MG PO TABS: 6.2500 mg | ORAL_TABLET | Freq: Two times a day (BID) | ORAL | 3 refills | Status: AC

## 2023-12-16 MED ORDER — LOSARTAN POTASSIUM 25 MG PO TABS: 25.0000 mg | ORAL_TABLET | Freq: Every day | ORAL | 3 refills | Status: AC

## 2023-12-16 MED ORDER — ROSUVASTATIN CALCIUM 40 MG PO TABS
40.0000 mg | ORAL_TABLET | Freq: Every day | ORAL | 3 refills | Status: AC
Start: 2023-12-16 — End: ?

## 2023-12-16 MED ORDER — TIRZEPATIDE 12.5 MG/0.5ML ~~LOC~~ SOAJ: 12.5000 mg | SUBCUTANEOUS | 3 refills | Status: AC

## 2023-12-16 MED ORDER — ZOLPIDEM TARTRATE 10 MG PO TABS: ORAL_TABLET | ORAL | 1 refills | Status: DC

## 2023-12-16 MED ORDER — ARIPIPRAZOLE 2 MG PO TABS: 2.0000 mg | ORAL_TABLET | Freq: Every day | ORAL | 3 refills | Status: AC

## 2023-12-16 MED ORDER — PIOGLITAZONE HCL 45 MG PO TABS: 45.0000 mg | ORAL_TABLET | Freq: Every day | ORAL | 3 refills | Status: AC

## 2023-12-16 MED ORDER — FLUOXETINE HCL 20 MG PO CAPS: 20.0000 mg | ORAL_CAPSULE | Freq: Every day | ORAL | 3 refills | Status: AC

## 2023-12-16 MED ORDER — ONETOUCH VERIO VI STRP: ORAL_STRIP | 11 refills | Status: AC

## 2023-12-16 NOTE — Progress Notes (Unsigned)
 Patient ID: Kelly Rowe, female   DOB: [birth year], 53 y.o.   MRN: [id number]         Chief Complaint:: wellness exam and Diabetes (Follow up and discuss meds and labwork ) , nhld, resistant depression       HPI:  Kelly Rowe is a 53 y.o. female here for wellness exam; due for prevnar 20, plans to see Gyn soon, o/w up to date                        Also Pt denies chest pain, increased sob or doe, wheezing, orthopnea, PND, increased LE swelling, palpitations, dizziness or syncope.   Pt denies polydipsia, polyuria, or new focal neuro s/s.    Pt denies fever, wt loss, night sweats, loss of appetite, or other constitutional symptoms Also has mild worsening depression now improved with current tx.      Wt Readings from Last 3 Encounters:  12/16/23 234 lb (106.1 kg)  12/01/23 233 lb (105.7 kg)  11/09/23 233 lb 12.8 oz (106.1 kg)   BP Readings from Last 3 Encounters:  12/16/23 130/76  11/09/23 (!) 120/92  09/08/23 114/76   Immunization History  Administered Date(s) Administered   Hep A / Hep B 09/05/2008, 10/31/2008, 05/01/2009   Hepatitis A, Adult 02/09/2023   Hepb-cpg 02/09/2023   Influenza Split 05/30/2012, 08/08/2013, 05/10/2014, 03/31/2016   Influenza, Quadrivalent, Recombinant, Inj, Pf 03/31/2018   Influenza,inj,Quad PF,6+ Mos 05/30/2012, 03/31/2016, 04/05/2019, 05/04/2021, 08/02/2022   Influenza,inj,quad, With Preservative 08/08/2013, 05/10/2014   Influenza,trivalent, recombinat, inj, PF 03/31/2018   Influenza-Unspecified 05/30/2012, 03/31/2016   Meningococcal Mcv4o 01/18/2022   Moderna Covid-19 Fall Seasonal Vaccine 2yrs & older 08/02/2022   PFIZER Comirnaty(Gray Top)Covid-19 Tri-Sucrose Vaccine 02/18/2021, 03/08/2021   PNEUMOCOCCAL CONJUGATE-20 12/16/2023   PPD Test 01/10/2015   Pneumococcal Conjugate-13 08/08/2013   Pneumococcal Polysaccharide-23 05/23/2008, 05/10/2014   Td 03/12/2009, 12/06/2022   Td (Adult),5 Lf Tetanus Toxid, Preservative Free  03/12/2009   Tdap 11/02/2010, 04/05/2019   Zoster Recombinant(Shingrix) 08/30/2022, 11/01/2022   Health Maintenance Due  Topic Date Due   Cervical Cancer Screening (Pap smear)  08/18/2023      Past Medical History:  Diagnosis Date   ANEMIA-IRON DEFICIENCY 01/30/2010   ANXIETY 11/27/2007   Arthritis    Asthma 02/25/2011   Cancer (HCC)    breast   Carbuncle and furuncle of trunk 04/16/2010   Chlamydia infection 03/21/2008   DIABETES MELLITUS, TYPE II 08/02/2007   Edema 01/30/2010   ELEVATED BLOOD PRESSURE WITHOUT DIAGNOSIS OF HYPERTENSION 08/02/2007   Family history of breast cancer    Family history of colon cancer    Family history of lung cancer    FREQUENCY, URINARY 05/19/2009   GENITAL HERPES 03/12/2009   GERD (gastroesophageal reflux disease)    History of kidney stones    History of radiation therapy 07/27/18- 09/06/18   Right Breast, Right Axillary and Supraclavicular nodes 50 Gy in 25 fractions, Right Breast Boost 10 Gy in 5 fractions   HIV (human immunodeficiency virus infection) (HCC) 2009   HIV INFECTION 03/12/2009   HSV (herpes simplex virus) infection    HYPERLIPIDEMIA 08/02/2007   Hypertension    Metrorrhagia 03/21/2008   PONV (postoperative nausea and vomiting)    woke up crying per patient    RETENTION, URINE 05/19/2009   Sleep apnea    Trichomonas infection 01/19/2010   VITAMIN D  DEFICIENCY 01/30/2010   Past Surgical History:  Procedure Laterality Date   ABDOMINAL HYSTERECTOMY  07/22/2010   TAH WITH PRESERVATION OF BOTH TUBES AND OVARIES   BREAST LUMPECTOMY Right 2019   BREAST LUMPECTOMY WITH RADIOACTIVE SEED AND SENTINEL LYMPH NODE BIOPSY Right 02/23/2018   Procedure: BREAST LUMPECTOMY WITH RADIOACTIVE SEED AND SENTINEL LYMPH NODE BIOPSY;  Surgeon: Lockie Rima, MD;  Location: [facility];  Service: General;  Laterality: Right;   CHOLECYSTECTOMY     ENDOMETRIAL ABLATION  01/11/2008   HER OPTION   ESOPHAGOGASTRODUODENOSCOPY     MULTIPLE TOOTH EXTRACTIONS     PORT-A-CATH  REMOVAL Left 05/08/2019   Procedure: REMOVAL PORT-A-CATH;  Surgeon: Lockie Rima, MD;  Location: [facility];  Service: General;  Laterality: Left;   PORTACATH PLACEMENT Left 02/23/2018   Procedure: INSERTION PORT-A-CATH;  Surgeon: Lockie Rima, MD;  Location: [facility];  Service: General;  Laterality: Left;   PORTACATH PLACEMENT N/A 04/21/2018   Procedure: PORT-A-CATH REVISION;  Surgeon: Lockie Rima, MD;  Location: [facility];  Service: General;  Laterality: N/A;   TUBAL LIGATION     WISDOM TOOTH EXTRACTION      reports that she has never smoked. She has been exposed to tobacco smoke. She has never used smokeless tobacco. She reports current alcohol use. She reports that she does not use drugs. family history includes Breast cancer in her paternal aunt, paternal aunt, and paternal aunt; Cancer in her father; Clotting disorder in her mother; Colon cancer in her father, paternal grandfather, and another family member; Diabetes in her father and mother; Heart attack (age of onset: 51) in her maternal grandfather; Heart disease in an other family member; Hypertension in her mother; Lung cancer in her paternal aunt; Lung cancer (age of onset: 44) in her maternal grandmother; Prostate cancer in an other family member; Stroke in her paternal uncle and another family member. Allergies  Allergen Reactions   Metformin  And Related Other (See Comments)    Bowel frequency   Levaquin  [Levofloxacin  In D5w] Nausea And Vomiting and Rash   Penicillins Swelling and Rash    Has patient had a PCN reaction causing immediate rash, facial/tongue/throat swelling, SOB or lightheadedness with hypotension: Yes Has patient had a PCN reaction causing severe rash involving mucus membranes or skin necrosis: No Has patient had a PCN reaction that required hospitalization: Yes Has patient had a PCN reaction occurring within the last 10 years: No If all of the above answers are "NO", then may proceed with Cephalosporin  use.   Current Outpatient Medications on File Prior to Visit  Medication Sig Dispense Refill   ALPRAZolam (XANAX) 0.25 MG tablet Take by mouth.     Beclomethasone Dipropionate  80 MCG/ACT AERS Place into the nose.     bictegravir-emtricitabine-tenofovir AF (BIKTARVY) 50-200-25 MG TABS tablet Take 1 tablet by mouth daily.     blood glucose meter kit and supplies Dispense based on patient and insurance preference. Use up to four times daily as directed. (FOR ICD-10 E10.9, E11.9). 1 each 0   Cholecalciferol (THERA-D 2000) 50 MCG (2000 UT) TABS 1 tab by mouth once daily 30 tablet 99   Diclofenac  Sodium (PENNSAID ) 2 % SOLN Place 1 application onto the skin 2 (two) times daily. 112 g 2   Doxepin  HCl 3 MG TABS Take 1 tablet (3 mg total) by mouth at bedtime. 30 tablet 3   Ferrous Fumarate (HEMOCYTE - 106 MG FE) 324 (106 Fe) MG TABS tablet Take by mouth.     ferrous gluconate (FERGON) 240 (27 FE) MG tablet Take 240 mg by mouth daily as  needed (iron).      Fezolinetant  (VEOZAH ) 45 MG TABS Take 1 tablet (45 mg total) by mouth daily. 30 tablet 2   gabapentin  (NEURONTIN ) 300 MG capsule Take 1 capsule (300 mg total) by mouth at bedtime. 90 capsule 4   hydrochlorothiazide (MICROZIDE) 12.5 MG capsule Take 1 capsule by mouth daily.     hydrocortisone  (ANUSOL -HC) 2.5 % rectal cream Place 1 application rectally 2 (two) times daily. 30 g 1   ibuprofen  (ADVIL ,MOTRIN ) 800 MG tablet Take 1 tablet (800 mg total) by mouth every 8 (eight) hours as needed. 60 tablet 0   Insulin  Pen Needle (PEN NEEDLES) 32G X 4 MM MISC Use with insulin  pen 100 each 12   Lancets (ONETOUCH DELICA PLUS LANCET33G) MISC USE TO TEST BLOOD SUGAR UP TO 4 TIMES DAILY 100 each 5   linaclotide  (LINZESS ) 145 MCG CAPS capsule Take 1 capsule (145 mcg total) by mouth daily before breakfast. 30 capsule 5   loratadine (CLARITIN) 10 MG tablet Take 10 mg by mouth daily.     Multiple Vitamin (MULTIVITAMIN WITH MINERALS) TABS tablet Take 1 tablet by mouth  daily.     nitroGLYCERIN  (NITRODUR - DOSED IN MG/24 HR) 0.2 mg/hr patch Apply 1/4 patch daily to tendon for tendonitis. 30 patch 1   nystatin -triamcinolone  ointment (MYCOLOG) Apply 1 Application topically 2 (two) times daily. 30 g 0   prochlorperazine  (COMPAZINE ) 10 MG tablet Take by mouth.     scopolamine  (TRANSDERM-SCOP) 1 MG/3DAYS Place 1 patch (1.5 mg total) onto the skin every 3 (three) days. 10 patch 12   Suvorexant  (BELSOMRA ) 15 MG TABS Take 1 tablet by mouth at bedtime as needed. 30 tablet 5   tamoxifen  (NOLVADEX ) 20 MG tablet Take 1 tablet by mouth once daily 90 tablet 0   triamcinolone  (NASACORT ) 55 MCG/ACT AERO nasal inhaler Place 2 sprays into the nose daily. 1 each 12   valACYclovir  (VALTREX ) 500 MG tablet Take twice daily for 3-5 days 30 tablet 2   No current facility-administered medications on file prior to visit.        ROS:  All others reviewed and negative.  Objective        PE:  BP 130/76 (BP Location: Right Arm, Patient Position: Sitting, Cuff Size: Normal)   Pulse 100   Temp 98.8 F (37.1 C) (Oral)   Ht 5\' 2"  (1.575 m)   Wt 234 lb (106.1 kg)   LMP 06/21/2010   SpO2 99%   BMI 42.80 kg/m                 Constitutional: Pt appears in NAD               HENT: Head: NCAT.                Right Ear: External ear normal.                 Left Ear: External ear normal.                Eyes: . Pupils are equal, round, and reactive to light. Conjunctivae and EOM are normal               Nose: without d/c or deformity               Neck: Neck supple. Gross normal ROM               Cardiovascular: Normal rate and regular rhythm.  Pulmonary/Chest: Effort normal and breath sounds without rales or wheezing.                Abd:  Soft, NT, ND, + BS, no organomegaly               Neurological: Pt is alert. At baseline orientation, motor grossly intact               Skin: Skin is warm. No rashes, no other new lesions, LE edema - none               Psychiatric: Pt  behavior is normal without agitation   Micro: none  Cardiac tracings I have personally interpreted today:  none  Pertinent Radiological findings (summarize): none   Lab Results  Component Value Date   WBC 3.4 (L) 12/16/2023   HGB 13.0 12/16/2023   HCT 39.8 12/16/2023   PLT 225.0 12/16/2023   GLUCOSE 130 (H) 12/16/2023   CHOL 205 (H) 12/16/2023   TRIG 105.0 12/16/2023   HDL 57.60 12/16/2023   LDLDIRECT 143.0 10/03/2018   LDLCALC 126 (H) 12/16/2023   ALT 19 12/16/2023   AST 20 12/16/2023   NA 139 12/16/2023   K 4.4 12/16/2023   CL 103 12/16/2023   CREATININE 0.80 12/16/2023   BUN 12 12/16/2023   CO2 28 12/16/2023   TSH 5.39 12/16/2023   HGBA1C 7.3 (H) 12/16/2023   MICROALBUR <0.7 12/16/2023   Assessment/Plan:  Kelly Rowe is a 53 y.o. Black or African American [2] female with  has a past medical history of ANEMIA-IRON DEFICIENCY (01/30/2010), ANXIETY (11/27/2007), Arthritis, Asthma (02/25/2011), Cancer (HCC), Carbuncle and furuncle of trunk (04/16/2010), Chlamydia infection (03/21/2008), DIABETES MELLITUS, TYPE II (08/02/2007), Edema (01/30/2010), ELEVATED BLOOD PRESSURE WITHOUT DIAGNOSIS OF HYPERTENSION (08/02/2007), Family history of breast cancer, Family history of colon cancer, Family history of lung cancer, FREQUENCY, URINARY (05/19/2009), GENITAL HERPES (03/12/2009), GERD (gastroesophageal reflux disease), History of kidney stones, History of radiation therapy (07/27/18- 09/06/18), HIV (human immunodeficiency virus infection) (HCC) (2009), HIV INFECTION (03/12/2009), HSV (herpes simplex virus) infection, HYPERLIPIDEMIA (08/02/2007), Hypertension, Metrorrhagia (03/21/2008), PONV (postoperative nausea and vomiting), RETENTION, URINE (05/19/2009), Sleep apnea, Trichomonas infection (01/19/2010), and VITAMIN D  DEFICIENCY (01/30/2010).  Encounter for well adult exam with abnormal findings Age and sex appropriate education and counseling updated with regular exercise and diet Referrals for  preventative services - none needed Immunizations addressed - for prevnar 20 Smoking counseling  - none needed Evidence for depression or other mood disorder - none significant Most recent labs reviewed. 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  Rosalia Colonel, MD 12/18/2023 5:12 PM [facility]

## 2023-12-16 NOTE — Patient Instructions (Signed)
 Please take all new medication as prescribed - the low dose abilfy, and the crestor  20 mg for cholesterol  You had the Prevnar 20 pneumonia shot today  Ok to increase the mounjaro  to 12.5 mg weekly  Please continue all other medications as before, and refills have been done if requested.  Please have the pharmacy call with any other refills you may need.  Please continue your efforts at being more active, low cholesterol diet, and weight control.  You are otherwise up to date with prevention measures today.  Please keep your appointments with your specialists as you may have planned  Please go to the LAB at the blood drawing area for the tests to be done  You will be contacted by phone if any changes need to be made immediately.  Otherwise, you will receive a letter about your results with an explanation, but please check with MyChart first.  Please make an Appointment to return in 6 months, or sooner if needed, also with Lab Appointment for testing done 3-5 days before at the FIRST FLOOR Lab (so this is for TWO appointments - please see the scheduling desk as you leave)

## 2023-12-18 ENCOUNTER — Encounter: Payer: Self-pay | Admitting: Internal Medicine

## 2023-12-18 NOTE — Assessment & Plan Note (Signed)
 Lab Results  Component Value Date   HGBA1C 7.3 (H) 12/16/2023   uncontrolled, pt to continue current medical treatment glucotrol  xl 10 every day, actos  45 every day, and increased mounjaro  12.5 mg weekly

## 2023-12-18 NOTE — Assessment & Plan Note (Signed)
 Age and sex appropriate education and counseling updated with regular exercise and diet Referrals for preventative services - none needed Immunizations addressed - for prevnar 20 Smoking counseling  - none needed Evidence for depression or other mood disorder - none significant Most recent labs reviewed. I have personally reviewed and have noted: 1) the patient's medical and social history 2) The patient's current medications and supplements 3) The patient's height, weight, and BMI have been recorded in the chart

## 2023-12-18 NOTE — Assessment & Plan Note (Signed)
 Last vitamin D  Lab Results  Component Value Date   VD25OH 21.36 (L) 12/16/2023   Low, to start oral replacement

## 2023-12-18 NOTE — Assessment & Plan Note (Signed)
 Mild resistant to tx, to add ability 2 mg qd

## 2023-12-18 NOTE — Assessment & Plan Note (Addendum)
 Lab Results  Component Value Date   LDLCALC 126 (H) 12/16/2023   uncontrolled, pt to start crestor  20 mg qd

## 2024-01-08 ENCOUNTER — Other Ambulatory Visit: Payer: Self-pay | Admitting: Hematology and Oncology

## 2024-01-08 DIAGNOSIS — C50911 Malignant neoplasm of unspecified site of right female breast: Secondary | ICD-10-CM

## 2024-01-23 ENCOUNTER — Other Ambulatory Visit: Payer: Self-pay | Admitting: Internal Medicine

## 2024-01-23 ENCOUNTER — Other Ambulatory Visit: Payer: Self-pay

## 2024-01-23 ENCOUNTER — Other Ambulatory Visit: Payer: Self-pay | Admitting: Hematology and Oncology

## 2024-01-23 DIAGNOSIS — C50911 Malignant neoplasm of unspecified site of right female breast: Secondary | ICD-10-CM

## 2024-01-25 ENCOUNTER — Telehealth: Payer: Self-pay

## 2024-01-25 NOTE — Telephone Encounter (Signed)
 Pt verbally confirmed appt for 7/3

## 2024-01-26 ENCOUNTER — Inpatient Hospital Stay: Payer: Medicare PPO | Attending: Hematology and Oncology | Admitting: Hematology and Oncology

## 2024-01-26 VITALS — BP 126/76 | HR 93 | Temp 97.8°F | Resp 17 | Ht 62.0 in | Wt 233.9 lb

## 2024-01-26 DIAGNOSIS — Z87442 Personal history of urinary calculi: Secondary | ICD-10-CM | POA: Diagnosis not present

## 2024-01-26 DIAGNOSIS — Z8049 Family history of malignant neoplasm of other genital organs: Secondary | ICD-10-CM | POA: Insufficient documentation

## 2024-01-26 DIAGNOSIS — E1165 Type 2 diabetes mellitus with hyperglycemia: Secondary | ICD-10-CM | POA: Insufficient documentation

## 2024-01-26 DIAGNOSIS — Z7985 Long-term (current) use of injectable non-insulin antidiabetic drugs: Secondary | ICD-10-CM | POA: Insufficient documentation

## 2024-01-26 DIAGNOSIS — C50911 Malignant neoplasm of unspecified site of right female breast: Secondary | ICD-10-CM

## 2024-01-26 DIAGNOSIS — Z833 Family history of diabetes mellitus: Secondary | ICD-10-CM | POA: Diagnosis not present

## 2024-01-26 DIAGNOSIS — C50411 Malignant neoplasm of upper-outer quadrant of right female breast: Secondary | ICD-10-CM | POA: Diagnosis not present

## 2024-01-26 DIAGNOSIS — Z823 Family history of stroke: Secondary | ICD-10-CM | POA: Diagnosis not present

## 2024-01-26 DIAGNOSIS — Z170 Estrogen receptor positive status [ER+]: Secondary | ICD-10-CM | POA: Diagnosis not present

## 2024-01-26 DIAGNOSIS — Z79899 Other long term (current) drug therapy: Secondary | ICD-10-CM | POA: Diagnosis not present

## 2024-01-26 DIAGNOSIS — Z880 Allergy status to penicillin: Secondary | ICD-10-CM | POA: Diagnosis not present

## 2024-01-26 DIAGNOSIS — Z923 Personal history of irradiation: Secondary | ICD-10-CM | POA: Diagnosis not present

## 2024-01-26 DIAGNOSIS — Z803 Family history of malignant neoplasm of breast: Secondary | ICD-10-CM | POA: Insufficient documentation

## 2024-01-26 DIAGNOSIS — E785 Hyperlipidemia, unspecified: Secondary | ICD-10-CM | POA: Diagnosis not present

## 2024-01-26 DIAGNOSIS — C773 Secondary and unspecified malignant neoplasm of axilla and upper limb lymph nodes: Secondary | ICD-10-CM | POA: Diagnosis not present

## 2024-01-26 DIAGNOSIS — Z9049 Acquired absence of other specified parts of digestive tract: Secondary | ICD-10-CM | POA: Diagnosis not present

## 2024-01-26 DIAGNOSIS — Z881 Allergy status to other antibiotic agents status: Secondary | ICD-10-CM | POA: Insufficient documentation

## 2024-01-26 DIAGNOSIS — Z801 Family history of malignant neoplasm of trachea, bronchus and lung: Secondary | ICD-10-CM | POA: Diagnosis not present

## 2024-01-26 DIAGNOSIS — Z79810 Long term (current) use of selective estrogen receptor modulators (SERMs): Secondary | ICD-10-CM | POA: Insufficient documentation

## 2024-01-26 DIAGNOSIS — Z8042 Family history of malignant neoplasm of prostate: Secondary | ICD-10-CM | POA: Diagnosis not present

## 2024-01-26 DIAGNOSIS — Z800 Family history of malignant neoplasm of digestive organs: Secondary | ICD-10-CM | POA: Diagnosis not present

## 2024-01-26 MED ORDER — TAMOXIFEN CITRATE 20 MG PO TABS: 20.0000 mg | ORAL_TABLET | Freq: Every day | ORAL | 3 refills | Status: AC

## 2024-01-26 NOTE — Progress Notes (Signed)
 [facility]  [phone] [phone]    ID: Kelly Rowe DOB: 1971/03/08  MR#: [id number]  RDW#:[id number]  Patient Care Team: Kelly Rowe ORN, MD as PCP - Kelly Kelly Shoulders, MD as Consulting Physician (General Surgery) Kelly Domino, MD as Attending Physician (Radiation Oncology) Kelly Millman, PA-C as Referring Physician (Physician Assistant) Kelly Artist RAMAN, MD as Consulting Physician (Family Medicine) Kelly Annabella LABOR, NP as Nurse Practitioner (Gynecology) [facility] (Optometry)  CHIEF COMPLAINT: Triple positive breast cancer  CURRENT TREATMENT:  tamoxifen   INTERVAL HISTORY:  Kelly Rowe returns today for follow-up of her triple positive breast cancer.  Discussed the use of AI scribe software for clinical note transcription with the patient, who gave verbal consent to proceed.  History of Present Illness Kelly Rowe is a 53 year old female with breast cancer who presents with a metallic taste in her mouth.  She experiences a metallic taste in her mouth, described as similar to aluminum. She has not started any new medications recently and does not take iron tablets. She is unsure which medication might be responsible for this taste.  She has a history of breast cancer and is currently on tamoxifen . She confirms compliance with her tamoxifen  regimen and plans to start a new bottle soon as she is almost out of her current one.  Her diabetes is well-controlled, with a recent hemoglobin A1c of 7.3, improved from previous levels as high as 12. She attributes this improvement to the use of Mounjaro  and reports weight loss from 265 pounds to 230 pounds.  No changes in her breast, breathing difficulties, cough, shortness of breath, blood in stool, black stool, urinary issues, headaches, double vision, or falls. She mentions experiencing hot flashes, which she relates to her current age and medication use.  Rest of  the pertinent 10 point ROS reviewed and negative   REVIEW OF SYSTEMS: Review of Systems  Constitutional:  Negative for appetite change, chills, fatigue, fever and unexpected weight change.  HENT:   Negative for hearing loss, lump/mass and trouble swallowing.   Eyes:  Negative for eye problems and icterus.  Respiratory:  Negative for chest tightness, cough and shortness of breath.   Cardiovascular:  Negative for chest pain, leg swelling and palpitations.  Gastrointestinal:  Negative for abdominal distention, abdominal pain, constipation, diarrhea, nausea and vomiting.  Endocrine: Negative for hot flashes.  Genitourinary:  Negative for difficulty urinating.   Musculoskeletal:  Negative for arthralgias.  Skin:  Negative for itching and rash.  Neurological:  Negative for dizziness, extremity weakness, headaches and numbness.  Hematological:  Negative for adenopathy. Does not bruise/bleed easily.  Psychiatric/Behavioral:  Negative for depression. The patient is not nervous/anxious.       COVID 19 VACCINATION STATUS: Refuses vaccination, had COVID December 2021   HISTORY OF CURRENT ILLNESS: From the original intake note:  Kelly Rowe noticed bruising in her lateral right breast and palpated a mass  on 12/23/2017. She followed up with her gynecologist. She underwent unilateral right diagnostic mammography with tomography and right breast ultrasonography at [facility] on 01/05/2018 showing: breast density category B. There is an irregular highly suspicious mass within the right breast at the 9:30 o'clock upper outer quadrant, measuring 1.8 x 1.1 x 1.5 cm, and located 18 cm from the nipple,  Ultrasonography revealed a single morphologically abnormal lymph node in the RIGHT axilla, with cortical thickness of 6 mm.   Accordingly on 01/11/2018 she proceeded to biopsy of the right breast area  in question as well as a suspicious lymph node. The pathology from this procedure showed ([id number]):  Invasive ductal carcinoma grade III.  The lymph node biopsied was negative for carcinoma (concordant).. Prognostic indicators significant for: estrogen receptor, 60% positive with weak staining intensity and progesterone receptor, 10% positive with strong staining intensity. Proliferation marker Ki67 at 30%. HER2 amplified with ratios HER2/CEP17 signals 2.26 and average HER2 copies per cell 3.50  The patient's subsequent history is as detailed below.   PAST MEDICAL HISTORY: Past Medical History:  Diagnosis Date   ANEMIA-IRON DEFICIENCY 01/30/2010   ANXIETY 11/27/2007   Arthritis    Asthma 02/25/2011   Cancer (HCC)    breast   Carbuncle and furuncle of trunk 04/16/2010   Chlamydia infection 03/21/2008   DIABETES MELLITUS, TYPE II 08/02/2007   Edema 01/30/2010   ELEVATED BLOOD PRESSURE WITHOUT DIAGNOSIS OF HYPERTENSION 08/02/2007   Family history of breast cancer    Family history of colon cancer    Family history of lung cancer    FREQUENCY, URINARY 05/19/2009   GENITAL HERPES 03/12/2009   GERD (gastroesophageal reflux disease)    History of kidney stones    History of radiation therapy 07/27/18- 09/06/18   Right Breast, Right Axillary and Supraclavicular nodes 50 Gy in 25 fractions, Right Breast Boost 10 Gy in 5 fractions   HIV (human immunodeficiency virus infection) (HCC) 2009   HIV INFECTION 03/12/2009   HSV (herpes simplex virus) infection    HYPERLIPIDEMIA 08/02/2007   Hypertension    Metrorrhagia 03/21/2008   PONV (postoperative nausea and vomiting)    woke up crying per patient    RETENTION, URINE 05/19/2009   Sleep apnea    Trichomonas infection 01/19/2010   VITAMIN D  DEFICIENCY 01/30/2010    PAST SURGICAL HISTORY: Past Surgical History:  Procedure Laterality Date   ABDOMINAL HYSTERECTOMY  07/22/2010   TAH WITH PRESERVATION OF BOTH TUBES AND OVARIES   BREAST LUMPECTOMY Right 2019   BREAST LUMPECTOMY WITH RADIOACTIVE SEED AND SENTINEL LYMPH NODE BIOPSY Right 02/23/2018   Procedure:  BREAST LUMPECTOMY WITH RADIOACTIVE SEED AND SENTINEL LYMPH NODE BIOPSY;  Surgeon: Kelly Shoulders, MD;  Location: [facility];  Service: General;  Laterality: Right;   CHOLECYSTECTOMY     ENDOMETRIAL ABLATION  01/11/2008   HER OPTION   ESOPHAGOGASTRODUODENOSCOPY     MULTIPLE TOOTH EXTRACTIONS     PORT-A-CATH REMOVAL Left 05/08/2019   Procedure: REMOVAL PORT-A-CATH;  Surgeon: Kelly Shoulders, MD;  Location: [facility];  Service: General;  Laterality: Left;   PORTACATH PLACEMENT Left 02/23/2018   Procedure: INSERTION PORT-A-CATH;  Surgeon: Kelly Shoulders, MD;  Location: [facility] OR;  Service: General;  Laterality: Left;   PORTACATH PLACEMENT N/A 04/21/2018   Procedure: PORT-A-CATH REVISION;  Surgeon: Kelly Shoulders, MD;  Location: [facility];  Service: General;  Laterality: N/A;   TUBAL LIGATION     WISDOM TOOTH EXTRACTION      FAMILY HISTORY: Family History  Problem Relation Age of Onset   Diabetes Mother    Hypertension Mother    Clotting disorder Mother    Diabetes Father    Cancer Father        COLON and LU NG   Colon cancer Father    Breast cancer Paternal Aunt    Lung cancer Paternal Aunt    Breast cancer Paternal Aunt    Breast cancer Paternal Aunt    Stroke Paternal Uncle    Lung cancer Maternal Grandmother 61  Mesothelioma   Heart attack Maternal Grandfather 58   Colon cancer Paternal Grandfather        dx over 73s   Prostate cancer Other    Heart disease Other    Stroke Other    Colon cancer Other        MGM's 5 brothers  The patient's father died at age 28 due to metastatic liver cancer. The patient's mother is alive at 83. The patient's has 1 brother and 3 sisters. There was a paternal grandfather with colon cancer. There were 3 paternal aunts with breast cancer, 2 diagnosed in the 4's and 1 at age 85. There was a maternal grandmother with mesothelioma. The patient otherwise denies a history of ovarian cancer in the family.    GYNECOLOGIC HISTORY:  Patient's last  menstrual period was 06/21/2010. Menarche: 53 years old Age at first live birth: 53 years old She is GXP5. Her LMP was December 2011. She is status post partial hysterectomy without oophorectomy. She took oral contraception for 3 years with no complications. She never took HRT.    SOCIAL HISTORY: (Updated January 2022). Aubrianne worked as a Surveyor, mining and a Lawyer.  She is currently applying for disability.  At home are 2 of her sons, Lupita and Kalvin, and the patient's grandson, Leland  (who is Willie's son). The patient's oldest is Marsha who works in [place] as a Financial risk analyst. Daughter, Richerd  works as a Conservation officer, nature. Son, Lupita is disabled. Son, Kalvin works in [place] as a Data processing manager. Daughter, Necia also is a Data processing manager. The patient has 5 grandchildren and no great grandchildren. The patient does not belong to a church.    ADVANCED DIRECTIVES: Not in place; at the 01/18/2018 visit the patient was given the appropriate documents to complete on notarized at her discretion   HEALTH MAINTENANCE: Social History   Tobacco Use   Smoking status: Never    Passive exposure: Current   Smokeless tobacco: Never  Vaping Use   Vaping status: Never Used  Substance Use Topics   Alcohol use: Yes    Alcohol/week: 0.0 standard drinks of alcohol    Comment: occ   Drug use: No     Colonoscopy: Not yet  PAP: November 2018  Bone density: Never   Allergies  Allergen Reactions   Metformin  And Related Other (See Comments)    Bowel frequency   Levaquin  [Levofloxacin  In D5w] Nausea And Vomiting and Rash   Penicillins Swelling and Rash    Has patient had a PCN reaction causing immediate rash, facial/tongue/throat swelling, SOB or lightheadedness with hypotension: Yes Has patient had a PCN reaction causing severe rash involving mucus membranes or skin necrosis: No Has patient had a PCN reaction that required hospitalization: Yes Has patient had a PCN reaction occurring within the last 10 years: No If  all of the above answers are NO, then may proceed with Cephalosporin use.    Current Outpatient Medications  Medication Sig Dispense Refill   albuterol  (VENTOLIN  HFA) 108 (90 Base) MCG/ACT inhaler Inhale 2 puffs into the lungs every 6 (six) hours as needed for wheezing or shortness of breath. 1 g 11   ALPRAZolam (XANAX) 0.25 MG tablet Take by mouth.     ARIPiprazole  (ABILIFY ) 2 MG tablet Take 1 tablet (2 mg total) by mouth daily. 90 tablet 3   Beclomethasone Dipropionate  80 MCG/ACT AERS Place into the nose.     bictegravir-emtricitabine-tenofovir AF (BIKTARVY) 50-200-25 MG TABS tablet Take 1 tablet by mouth daily.  blood glucose meter kit and supplies Dispense based on patient and insurance preference. Use up to four times daily as directed. (FOR ICD-10 E10.9, E11.9). 1 each 0   carvedilol  (COREG ) 6.25 MG tablet Take 1 tablet (6.25 mg total) by mouth 2 (two) times daily with a meal. 180 tablet 3   Cholecalciferol (THERA-D 2000) 50 MCG (2000 UT) TABS 1 tab by mouth once daily 30 tablet 99   Diclofenac  Sodium (PENNSAID ) 2 % SOLN Place 1 application onto the skin 2 (two) times daily. 112 g 2   Doxepin  HCl 3 MG TABS Take 1 tablet (3 mg total) by mouth at bedtime. 30 tablet 3   Ferrous Fumarate (HEMOCYTE - 106 MG FE) 324 (106 Fe) MG TABS tablet Take by mouth.     ferrous gluconate (FERGON) 240 (27 FE) MG tablet Take 240 mg by mouth daily as needed (iron).      Fezolinetant  (VEOZAH ) 45 MG TABS Take 1 tablet (45 mg total) by mouth daily. 30 tablet 2   FLUoxetine  (PROZAC ) 20 MG capsule Take 1 capsule (20 mg total) by mouth daily. 90 capsule 3   gabapentin  (NEURONTIN ) 300 MG capsule Take 1 capsule (300 mg total) by mouth at bedtime. 90 capsule 4   glipiZIDE  (GLUCOTROL  XL) 10 MG 24 hr tablet Take 1 tablet (10 mg total) by mouth daily with breakfast. 90 tablet 3   glucose blood (ONETOUCH VERIO) test strip USE 1 STRIP TO CHECK GLUCOSE 4 TIMES DAILY AS DIRECTED. DX: E11.69 100 each 11    hydrochlorothiazide (MICROZIDE) 12.5 MG capsule Take 1 capsule by mouth daily.     hydrocortisone  (ANUSOL -HC) 2.5 % rectal cream Place 1 application rectally 2 (two) times daily. 30 g 1   ibuprofen  (ADVIL ,MOTRIN ) 800 MG tablet Take 1 tablet (800 mg total) by mouth every 8 (eight) hours as needed. 60 tablet 0   Insulin  Pen Needle (PEN NEEDLES) 32G X 4 MM MISC Use with insulin  pen 100 each 12   Lancets (ONETOUCH DELICA PLUS LANCET33G) MISC USE TO TEST BLOOD SUGAR UP TO 4 TIMES DAILY 100 each 5   LINZESS  145 MCG CAPS capsule TAKE 1 CAPSULE BY MOUTH ONCE DAILY BEFORE BREAKFAST 30 capsule 0   loratadine (CLARITIN) 10 MG tablet Take 10 mg by mouth daily.     losartan  (COZAAR ) 25 MG tablet Take 1 tablet (25 mg total) by mouth daily. 90 tablet 3   Multiple Vitamin (MULTIVITAMIN WITH MINERALS) TABS tablet Take 1 tablet by mouth daily.     nitroGLYCERIN  (NITRODUR - DOSED IN MG/24 HR) 0.2 mg/hr patch Apply 1/4 patch daily to tendon for tendonitis. 30 patch 1   nystatin -triamcinolone  ointment (MYCOLOG) Apply 1 Application topically 2 (two) times daily. 30 g 0   pioglitazone  (ACTOS ) 45 MG tablet Take 1 tablet (45 mg total) by mouth daily. 90 tablet 3   prochlorperazine  (COMPAZINE ) 10 MG tablet Take by mouth.     rosuvastatin  (CRESTOR ) 40 MG tablet Take 1 tablet (40 mg total) by mouth daily. 90 tablet 3   scopolamine  (TRANSDERM-SCOP) 1 MG/3DAYS Place 1 patch (1.5 mg total) onto the skin every 3 (three) days. 10 patch 12   Suvorexant  (BELSOMRA ) 15 MG TABS Take 1 tablet by mouth at bedtime as needed. 30 tablet 5   tamoxifen  (NOLVADEX ) 20 MG tablet Take 1 tablet by mouth once daily 90 tablet 0   tirzepatide  (MOUNJARO ) 12.5 MG/0.5ML Pen Inject 12.5 mg into the skin once a week. 6 mL 3   triamcinolone  (NASACORT )  55 MCG/ACT AERO nasal inhaler Place 2 sprays into the nose daily. 1 each 12   valACYclovir  (VALTREX ) 500 MG tablet Take twice daily for 3-5 days 30 tablet 2   zolpidem  (AMBIEN ) 10 MG tablet TAKE 1 & 1/2  (ONE & ONE-HALF) TABLETS BY MOUTH AT BEDTIME AS NEEDED FOR SLEEP 135 tablet 1   No current facility-administered medications for this visit.    OBJECTIVE:  There were no vitals filed for this visit.     There is no height or weight on file to calculate BMI.   Wt Readings from Last 3 Encounters:  12/16/23 234 lb (106.1 kg)  12/01/23 233 lb (105.7 kg)  11/09/23 233 lb 12.8 oz (106.1 kg)  ECOG FS: 2 - Symptomatic, <50% confined to bed  Physical Exam Constitutional:      Appearance: Normal appearance.  Cardiovascular:     Rate and Rhythm: Normal rate and regular rhythm.  Pulmonary:     Effort: Pulmonary effort is normal.     Breath sounds: Normal breath sounds.  Chest:     Comments: Bilateral breasts inspected.  She has large pendulous breast.  Right breast with postop and postradiation changes, no significant change since completion of treatment.  No new palpable masses.  Left breast normal to inspection.  No new palpable masses.  No regional adenopathy. Musculoskeletal:     Cervical back: Normal range of motion and neck supple. No rigidity.  Lymphadenopathy:     Cervical: No cervical adenopathy.  Neurological:     Mental Status: She is alert.       LAB RESULTS:  CMP     Component Value Date/Time   NA 139 12/16/2023 0906   K 4.4 12/16/2023 0906   CL 103 12/16/2023 0906   CO2 28 12/16/2023 0906   GLUCOSE 130 (H) 12/16/2023 0906   BUN 12 12/16/2023 0906   CREATININE 0.80 12/16/2023 0906   CREATININE 0.73 01/21/2022 1134   CALCIUM  9.5 12/16/2023 0906   PROT 7.4 12/16/2023 0906   ALBUMIN  4.3 12/16/2023 0906   AST 20 12/16/2023 0906   AST 20 01/21/2022 1134   ALT 19 12/16/2023 0906   ALT 29 01/21/2022 1134   ALKPHOS 122 (H) 12/16/2023 0906   BILITOT 0.2 12/16/2023 0906   BILITOT 0.3 01/21/2022 1134   GFRNONAA >60 01/21/2022 1134   GFRAA >60 04/16/2020 1119   GFRAA >60 10/11/2019 1054    No results found for: TOTALPROTELP, ALBUMINELP, A1GS, A2GS, BETS,  BETA2SER, GAMS, MSPIKE, SPEI  No results found for: KPAFRELGTCHN, LAMBDASER, KAPLAMBRATIO  Lab Results  Component Value Date   WBC 3.4 (L) 12/16/2023   NEUTROABS 1.3 (L) 12/16/2023   HGB 13.0 12/16/2023   HCT 39.8 12/16/2023   MCV 86.0 12/16/2023   PLT 225.0 12/16/2023   No results found for: LABCA2  No components found for: OJARJW874  No results for input(s): INR in the last 168 hours.  No results found for: LABCA2  No results found for: RJW800  No results found for: CAN125  No results found for: CAN153  No results found for: CA2729  No components found for: HGQUANT  No results found for: CEA1, CEA / No results found for: CEA1, CEA   No results found for: AFPTUMOR  No results found for: CHROMOGRNA  No results found for: HGBA, HGBA2QUANT, HGBFQUANT, HGBSQUAN (Hemoglobinopathy evaluation)   No results found for: LDH  Lab Results  Component Value Date   IRON 75 10/03/2019   IRONPCTSAT 20.9 10/03/2019   (  Iron and TIBC)  No results found for: FERRITIN  Urinalysis    Component Value Date/Time   COLORURINE YELLOW 12/16/2023 0906   APPEARANCEUR CLEAR 12/16/2023 0906   LABSPEC 1.025 12/16/2023 0906   PHURINE 5.5 12/16/2023 0906   GLUCOSEU NEGATIVE 12/16/2023 0906   HGBUR NEGATIVE 12/16/2023 0906   BILIRUBINUR NEGATIVE 12/16/2023 0906   KETONESUR NEGATIVE 12/16/2023 0906   PROTEINUR NEGATIVE 03/10/2021 1036   UROBILINOGEN 0.2 12/16/2023 0906   NITRITE NEGATIVE 12/16/2023 0906   LEUKOCYTESUR NEGATIVE 12/16/2023 0906    STUDIES: No results found.   ELIGIBLE FOR AVAILABLE RESEARCH PROTOCOL: no   ASSESSMENT: 53 y.o. [place], [place] woman status post right breast upper outer quadrant biopsy 01/11/2018 for a clinical T1 N0, stage IA invasive ductal carcinoma, grade 3, estrogen and progesterone receptor positive, HER-2 amplified, with an MIB-1 of 30%.  (1) status post right lumpectomy and sentinel  lymph node sampling 02/23/2018 for a pT2 pN1, stage IB invasive ductal carcinoma, grade 3, with close but negative margins  (2) adjuvant chemotherapy consisting of carboplatin , docetaxel , trastuzumab  and Pertuzumab  every 21 days x 6 given between 03/08/2018-07/05/2018 (a) pertuzumab  stopped after cycle 1 due to diarrhea (b) docetaxel  changed to gemcitabine  after cycle 2 due to persistent hyperglycemia (c) Gemcitabine  and Carboplatin  dose reduced by approximately 10% due to delayed neutropenia and thrombocytopenia  (3) anti-HER-2 immunotherapy started concurrently with chemotherapy (a) baseline echocardiogram on 02/07/2018 showed an ejection fraction in the 55-60% range (b) repeat echo 06/19/2018 showed an ejection fraction of 55-60% (c) repeat echocardiogram 11/14/2018 showed an EF in the 50-55% range, with normal strain. (d) echo 01/31/2019 shows an ejection fraction in the 55/60% range (e) final trastuzumab  dose 03/12/2019  (4) adjuvant radiation 07/27/2018 - 09/06/2018  Site/dose: 1. Right Breast / 50 Gy in 25 fractions    2. Right Supraclavicular nodes / 50 Gy in 25 fractions    3. Right Breast Boost / 10 Gy in 5 fractions  (5) tamoxifen  started November 2020  (6) genetics testing through [facility] Multi-cancer and Breast panel on 01/13/2018 showed: no deleterious mutations. The following genes were evaluated for sequence changes and exonic deletions/duplications: ALK, APC, ATM, AXIN2, BAP1, BARD1, BLM, BMPR1A, BRCA1, BRCA2, BRIP1, CASR, CDC73, CDH1, CDK4, CDKN1B, CDKN1C,CDKN2A (p14ARF), CDKN2A (p16INK4a), CEBPA, CHEK2, CTNNA1, DICER1, DIS3L2, EPCAM*, FH, FLCN, GATA2, GPC3, GREM1*, HRAS, KIT, MAX, MEN1, MET, MLH1, MSH2, MSH3, MSH6, MUTYH, NBN, NF1, NF2, PALB2, PDGFRA, PHOX2B*, PMS2, POLD1,POLE, POT1, PRKAR1A, PTCH1, PTEN, RAD50, RAD51C, RAD51D, RB1, RECQL4, RET, RUNX1, SDHAF2, SDHB, SDHC, SDHD,SMAD4, SMARCA4, SMARCB1, SMARCE1, STK11, SUFU, TERC, TERT, TMEM127, TP53, TSC1, TSC2, VHL, WRN*,  WT1.The following genes were evaluated for sequence changes only: EGFR*, HOXB13*, MITF*, NTHL1*, SDHA  (7) Mammogram December 2022 with no evidence of malignancy.   PLAN:  Assessment and Plan Assessment & Plan IDC, grade 3, ER PR and Her 2 pos On tamoxifen  since November 2020 with no recurrence. Metallic taste likely due to medication. Continuing tamoxifen  for up to seven years is reasonable if side effects are manageable. - Continue tamoxifen  therapy. - Schedule mammogram for December at the breast center [facility]. - Refill tamoxifen  prescription at [facility] on [address]  Type 2 diabetes mellitus Well-controlled with hemoglobin A1c of 7.3, improved from previous levels due to Mounjaro  and weight loss. - Continue current diabetes management regimen.  Total encounter time 20 minutes.*In face-to-face visit time, chart review, lab review, order entry, care coordination, and documentation of the encounter.  *Total Encounter Time as defined by the Centers for Medicare  and Medicaid Services includes, in addition to the face-to-face time of a patient visit (documented in the note above) non-face-to-face time: obtaining and reviewing outside history, ordering and reviewing medications, tests or procedures, care coordination (communications with other health care professionals or caregivers) and documentation in the medical record.

## 2024-02-03 ENCOUNTER — Other Ambulatory Visit (HOSPITAL_COMMUNITY): Payer: Self-pay

## 2024-02-03 ENCOUNTER — Telehealth: Payer: Self-pay

## 2024-02-03 NOTE — Telephone Encounter (Signed)
 Pharmacy Patient Advocate Encounter   Received notification from CoverMyMeds that prior authorization for Zolpidem  Tartrate 10MG  tablets  is due for renewal.   Insurance verification completed.   The patient is insured through [facility].  Action: Refill too soon. PA is not needed at this time. Medication was filled 01/05/2024. Next eligible fill date is 03/20/2024.

## 2024-02-06 NOTE — Telephone Encounter (Signed)
 Copied from CRM [id number]. Topic: Clinical - Prescription Issue >> Feb 06, 2024 12:45 PM Kelly Rowe wrote: Reason for CRM: Patient is calling in because the medication Zolpidem  Tartrate 10MG  tablets will need a new prescription in order for her to be able to pick up. She says she hasn'Rowe been able to sleep and needs a refill at all. I did advise PT the next eligible refill date as well. Please contact her to discuss.

## 2024-02-08 MED ORDER — ZOLPIDEM TARTRATE 10 MG PO TABS: ORAL_TABLET | ORAL | 1 refills | Status: AC

## 2024-02-08 NOTE — Telephone Encounter (Signed)
 Ok done erx

## 2024-02-08 NOTE — Addendum Note (Signed)
 Addended by: NORLEEN LYNWOOD ORN on: 02/08/2024 08:05 PM   Modules accepted: Orders

## 2024-02-09 NOTE — Telephone Encounter (Signed)
 Called and left detailed message for patient informing them that prescription had been filled and call back number incase there are issues picking up medicine

## 2024-02-14 ENCOUNTER — Other Ambulatory Visit (HOSPITAL_COMMUNITY): Payer: Self-pay

## 2024-04-01 ENCOUNTER — Other Ambulatory Visit: Payer: Self-pay | Admitting: Internal Medicine

## 2024-04-06 ENCOUNTER — Telehealth: Payer: Self-pay | Admitting: Radiology

## 2024-04-06 NOTE — Telephone Encounter (Signed)
 Copied from CRM #[id number]. Topic: Clinical - Prescription Issue >> Apr 06, 2024 12:24 PM Roselie BROCKS wrote: Reason for CRM: Patient needs all current and future prescriptions sent to a new pharmacy . [facility] in [place], Patient does not have address but will call back and confirm if needed. Patient was told in clinic that its a speciality pharmacy that helps with cost of meds

## 2024-04-12 NOTE — Telephone Encounter (Signed)
 Attempted to call patient but had to leave a voice message for call back. Wanted to clarify with her the address for this pharmacy and to see if it was a mail order pharmacy or not. Believed to have found it under a [address] in [place] but wanted to make sure before sending all meds to there

## 2024-04-13 NOTE — Telephone Encounter (Signed)
 Patient reached out and informed me that she did not have the exact location yet but would call back once she had the address

## 2024-04-18 ENCOUNTER — Other Ambulatory Visit (HOSPITAL_COMMUNITY): Payer: Self-pay

## 2024-04-18 ENCOUNTER — Telehealth: Payer: Self-pay

## 2024-04-18 NOTE — Telephone Encounter (Signed)
 Pharmacy Patient Advocate Encounter   Received notification from Onbase that prior authorization for Zolpidem  10 mg tablets is required/requested.   Insurance verification completed.   The patient is insured through [facility] .   Per test claim: PA required; PA submitted to above mentioned insurance via Latent Key/confirmation #/EOC [id number] Status is pending

## 2024-04-18 NOTE — Telephone Encounter (Signed)
 Pharmacy Patient Advocate Encounter  Received notification from [facility] that Prior Authorization for Zolpidem  10 mg tablets  has been DENIED.  See denial reason below. No denial letter attached in CMM. Will attach denial letter to Media tab once received.   PA #/Case ID/Reference #: [id number]  *[employer] partially denied this request. They will only pay for 30 tablets every 30 days. She can get 30 tablets on the insurance and then pay out of pocket for the remaining 15 tablets. I left a VM for the pharmacy to process.

## 2024-05-11 ENCOUNTER — Other Ambulatory Visit: Payer: Self-pay | Admitting: Internal Medicine

## 2024-05-11 ENCOUNTER — Other Ambulatory Visit: Payer: Self-pay

## 2024-05-30 ENCOUNTER — Telehealth: Payer: Self-pay

## 2024-05-30 NOTE — Telephone Encounter (Signed)
 LVM for pt regarding her Disability forms being completed,faxed, and confirmation received. Pt copy was mailed to home address.

## 2024-06-28 ENCOUNTER — Other Ambulatory Visit: Payer: Self-pay

## 2024-06-28 ENCOUNTER — Other Ambulatory Visit: Payer: Self-pay | Admitting: Internal Medicine

## 2024-07-11 ENCOUNTER — Telehealth: Payer: Self-pay | Admitting: Hematology and Oncology

## 2024-07-11 NOTE — Telephone Encounter (Signed)
 Left a voicemail regarding pt 01/28/25 appt being rescheduled to 01/30/25.

## 2024-07-12 ENCOUNTER — Ambulatory Visit: Payer: Self-pay

## 2024-07-12 NOTE — Telephone Encounter (Signed)
 FYI Only or Action Required?: Action required by provider: update on patient condition. Please call with work in or cancellation- currently scheduled Monday 12/22 and on waitlist  Patient was last seen in primary care on 12/16/2023 by Norleen Lynwood ORN, MD.  Called Nurse Triage reporting Tailbone Pain and Arm Pain.  Symptoms began a week ago.  Interventions attempted: OTC medications: ibuprofen  .  Symptoms are: gradually worsening.  Triage Disposition: See Physician Within 24 Hours, See PCP When Office is Open (Within 3 Days)  Patient/caregiver understands and will follow disposition?: Yes  Copied from CRM #[id number]. Topic: Clinical - Red Word Triage >> Jul 12, 2024  1:02 PM Victoria A wrote: [facility] that prompted transfer to Nurse Triage: Patient said on Monday she was pulling something on her mothers porch and pulled a muscle on the lower butt near her tailbone. 1-10; 8 Reason for Disposition  [1] MODERATE pain (e.g., interferes with normal activities) AND [2] present > 3 days  [1] MODERATE pain (e.g., interferes with normal activities) AND [2] high-risk adult (e.g., age > 60 years, osteoporosis, chronic steroid use)  Answer Assessment - Initial Assessment Questions Monday 12/15 patient was pushing a bike up onto her mothers porch and heard a pop in her tailbone area. Denies swelling, bruising, or pain to touch. Able to walk and sit on it but laying down causes 8/10 pain. At night, not sleeping, has to use assistance at night to get out of be. No paresthesias and no loss of bowel or bladder control.  Blood sugar went up with the pain- urinating more frequently -- has regulated now.   Also having unrelated left rotator cuff/shoulder pain for the last 2 months. Pain when laying on it 5/10. Denies CP, SOB, radiating to the neck or arm, or heartburn sensations. Denies numbness and tingling to the arm.   Soonest OV with PCP office made for Monday 12/22 and placed on the wait list to try to be  seen sooner.   1. MECHANISM: How did the injury happen?       Pushing a bike onto the porch and heard a pop and had to catch her breath  2. ONSET: When did the injury happen? (e.g., minutes, hours ago)      Monday  3. LOCATION: Where is the injury located?      Midline  4. SEVERITY: Can you sit? Can you walk?      Barely sleeping at night  5. PAIN: Is there pain? If Yes, ask: How bad is the pain?  (Scale 0-10; none, mild, moderate, or severe)     8/10 6. SIZE: For bruises, or swelling, ask: How large is it? (e.g., inches or centimeters)      Denies pain to palpation- 7. OTHER SYMPTOMS: Do you have any other symptoms? (e.g., numbness, back pain)     Denies  Answer Assessment - Initial Assessment Questions 1. ONSET: When did the pain start?      2months  2. LOCATION: Where is the pain located?     Left shoulder pain  3. PAIN: How bad is the pain? (Scale 0-10; or none, mild, moderate, severe)     5/10 when laying on it 4. WORK OR EXERCISE: Has there been any recent work or exercise that involved this part of the body?     Denies injury  5. CAUSE: What do you think is causing the arm pain?     Overuse?, getting older? 6. OTHER SYMPTOMS: Do you have any other  symptoms? (e.g., neck pain, swelling, rash, fever, numbness, weakness)     denies  Protocols used: Tailbone Injury-A-AH, Arm Pain-A-AH

## 2024-07-16 ENCOUNTER — Encounter: Payer: Self-pay | Admitting: Family Medicine

## 2024-07-16 ENCOUNTER — Ambulatory Visit: Admitting: Family Medicine

## 2024-07-16 ENCOUNTER — Ambulatory Visit

## 2024-07-16 VITALS — BP 124/90 | HR 81 | Temp 97.8°F | Ht 62.0 in | Wt 237.8 lb

## 2024-07-16 DIAGNOSIS — M545 Low back pain, unspecified: Secondary | ICD-10-CM

## 2024-07-16 MED ORDER — TRAMADOL HCL 50 MG PO TABS: 50.0000 mg | ORAL_TABLET | Freq: Three times a day (TID) | ORAL | 0 refills | Status: AC | PRN

## 2024-07-16 MED ORDER — METHOCARBAMOL 500 MG PO TABS: 500.0000 mg | ORAL_TABLET | Freq: Three times a day (TID) | ORAL | 0 refills | Status: AC | PRN

## 2024-07-16 NOTE — Patient Instructions (Signed)
 We are getting an xray today. We will be in contact with any abnormal results that require further attention.  I have sent in a muscle relaxer for you to use one tablet as needed for muscle spasms. This medication can make you sleepy. Do not drive until you know how this medication affects you.  I have sent in tramadol  for you to take one tablet every 6 hours as needed for breakthrough pain.   Follow-up with me for new or worsening symptoms.

## 2024-07-16 NOTE — Progress Notes (Signed)
 "  Acute Office Visit  Subjective:     Patient ID: Kelly Rowe, female    DOB: April 01, 1971, 53 y.o.   MRN: [id number]  Chief Complaint  Patient presents with   Acute Visit    Heard pop Monday when pushing moped pain began wrapping around to front of lower back on Friday hurts more to sit/get up than walk pain level 10 alternating extra strength tylenol  and motrin  2 tablets each for pain giving some relief blood sugar high since being in pain in 200's now was in 500's Monday normally in the 100s    HPI  Discussed the use of AI scribe software for clinical note transcription with the patient, who gave verbal consent to proceed.  History of Present Illness Kelly Rowe is a 53 year old female who presents with severe back pain radiating to the abdomen.  Severe back pain with abdominal radiation - Severe, deep back pain radiating to the abdomen - Onset after feeling a bone pop in the back - Pain is constant and worsened by sitting or lying down - Prevents sleep despite use of sleeping pills - Walking causes minimal discomfort - No prior episodes of similar back pain - No radiation of pain down the legs or into the buttocks  Analgesic and adjunctive therapy response - Over-the-counter Tylenol  Arthritis and Motrin  (alternating) have not provided relief - Epsom salt baths have not helped - Zebutal currently used - Muscle relaxers and Tramadol  used in the past, but not recently  Associated symptoms and vital sign changes - Mildly elevated blood sugar and blood pressure, attributed to pain  Occupational history - Works as a education administrator for 27 years     ROS Per HPI      Objective:    BP (!) 124/90 (BP Location: Left Arm, Patient Position: Sitting)   Pulse 81   Temp 97.8 F (36.6 C) (Temporal)   Ht 5' 2 (1.575 m)   Wt 237 lb 12.8 oz (107.9 kg)   LMP 06/21/2010   SpO2 98%   BMI 43.49 kg/m    Physical Exam Vitals and nursing note  reviewed.  Constitutional:      General: She is not in acute distress.    Appearance: She is obese.     Comments: Appears uncomfortable  HENT:     Head: Normocephalic and atraumatic.     Right Ear: External ear normal.     Left Ear: External ear normal.     Nose: Nose normal.     Mouth/Throat:     Mouth: Mucous membranes are moist.     Pharynx: Oropharynx is clear.  Eyes:     Extraocular Movements: Extraocular movements intact.  Cardiovascular:     Rate and Rhythm: Normal rate and regular rhythm.  Pulmonary:     Effort: Pulmonary effort is normal.  Musculoskeletal:     Cervical back: Normal range of motion.     Right lower leg: No edema.     Left lower leg: No edema.     Comments: Central low back mildly swollen, tender. LROM with position changes. No obvious deformity  Lymphadenopathy:     Cervical: No cervical adenopathy.  Neurological:     General: No focal deficit present.     Mental Status: She is alert and oriented to person, place, and time.  Psychiatric:        Mood and Affect: Mood normal.        Thought Content: Thought content  normal.     No results found for any visits on 07/16/24.      Assessment & Plan:   Assessment and Plan Assessment & Plan Lumbar Back Pain Acute low back pain with severe symptoms affecting sleep and daily activities. - Ordered x-ray of the lower back to assess for fractures or structural abnormalities. - Prescribed Robaxin  for pain management. - Prescribed tramadol  for pain management, noting it can be taken with ibuprofen  or acetaminophen  if needed. - Will consider further imaging if x-ray is normal and pain persists.  Left Shoulder pain Mild shoulder pain. - Will try muscle relaxers for this as well - If not helpful, will send to PT     Orders Placed This Encounter  Procedures   DG Lumbar Spine Complete    Standing Status:   Future    Number of Occurrences:   1    Expiration Date:   07/16/2025    Reason for Exam  (SYMPTOM  OR DIAGNOSIS REQUIRED):   low back pain    Is patient pregnant?:   No    Preferred imaging location?:   [place] [facility] ordered this encounter  Medications   methocarbamol  (ROBAXIN ) 500 MG tablet    Sig: Take 1 tablet (500 mg total) by mouth every 8 (eight) hours as needed for muscle spasms.    Dispense:  30 tablet    Refill:  0   traMADol  (ULTRAM ) 50 MG tablet    Sig: Take 1 tablet (50 mg total) by mouth every 8 (eight) hours as needed for up to 5 days.    Dispense:  15 tablet    Refill:  0    Return if symptoms worsen or fail to improve.  Corean LITTIE Ku, FNP  "

## 2024-07-17 ENCOUNTER — Ambulatory Visit: Payer: Self-pay | Admitting: Family Medicine

## 2024-08-07 ENCOUNTER — Ambulatory Visit
Admission: RE | Admit: 2024-08-07 | Discharge: 2024-08-07 | Disposition: A | Source: Ambulatory Visit | Attending: Hematology and Oncology

## 2024-08-07 DIAGNOSIS — C50411 Malignant neoplasm of upper-outer quadrant of right female breast: Secondary | ICD-10-CM

## 2024-09-06 ENCOUNTER — Ambulatory Visit: Admitting: Podiatry

## 2024-11-20 ENCOUNTER — Encounter: Admitting: Nurse Practitioner

## 2024-12-18 ENCOUNTER — Ambulatory Visit

## 2025-01-28 ENCOUNTER — Ambulatory Visit: Admitting: Hematology and Oncology

## 2025-01-30 ENCOUNTER — Inpatient Hospital Stay: Admitting: Hematology and Oncology
